# Patient Record
Sex: Male | Born: 1940 | Race: White | Hispanic: No | Smoking: Never smoker
Health system: Southern US, Community
[De-identification: ages and names within clinical notes are randomized; demographics above are authoritative.]

## PROBLEM LIST (undated history)

## (undated) DIAGNOSIS — R011 Cardiac murmur, unspecified: Secondary | ICD-10-CM

## (undated) DIAGNOSIS — M109 Gout, unspecified: Secondary | ICD-10-CM

## (undated) DIAGNOSIS — I701 Atherosclerosis of renal artery: Secondary | ICD-10-CM

## (undated) DIAGNOSIS — R413 Other amnesia: Secondary | ICD-10-CM

## (undated) DIAGNOSIS — N189 Chronic kidney disease, unspecified: Secondary | ICD-10-CM

## (undated) DIAGNOSIS — I714 Abdominal aortic aneurysm, without rupture, unspecified: Secondary | ICD-10-CM

## (undated) DIAGNOSIS — R188 Other ascites: Secondary | ICD-10-CM

## (undated) DIAGNOSIS — K7460 Unspecified cirrhosis of liver: Secondary | ICD-10-CM

## (undated) DIAGNOSIS — D696 Thrombocytopenia, unspecified: Secondary | ICD-10-CM

## (undated) DIAGNOSIS — E039 Hypothyroidism, unspecified: Secondary | ICD-10-CM

## (undated) DIAGNOSIS — I10 Essential (primary) hypertension: Secondary | ICD-10-CM

## (undated) DIAGNOSIS — I509 Heart failure, unspecified: Secondary | ICD-10-CM

## (undated) DIAGNOSIS — D649 Anemia, unspecified: Secondary | ICD-10-CM

## (undated) DIAGNOSIS — I2510 Atherosclerotic heart disease of native coronary artery without angina pectoris: Secondary | ICD-10-CM

## (undated) DIAGNOSIS — Z8639 Personal history of other endocrine, nutritional and metabolic disease: Secondary | ICD-10-CM

## (undated) DIAGNOSIS — R7303 Prediabetes: Secondary | ICD-10-CM

## (undated) DIAGNOSIS — Z9181 History of falling: Secondary | ICD-10-CM

## (undated) DIAGNOSIS — I350 Nonrheumatic aortic (valve) stenosis: Secondary | ICD-10-CM

## (undated) DIAGNOSIS — Z973 Presence of spectacles and contact lenses: Secondary | ICD-10-CM

## (undated) DIAGNOSIS — M1990 Unspecified osteoarthritis, unspecified site: Secondary | ICD-10-CM

## (undated) DIAGNOSIS — C801 Malignant (primary) neoplasm, unspecified: Secondary | ICD-10-CM

## (undated) DIAGNOSIS — I219 Acute myocardial infarction, unspecified: Secondary | ICD-10-CM

## (undated) HISTORY — PX: OTHER SURGICAL HISTORY: SHX169

## (undated) HISTORY — PX: PROSTATECTOMY: SHX69

## (undated) HISTORY — PX: CATARACT EXTRACTION W/ INTRAOCULAR LENS  IMPLANT, BILATERAL: SHX1307

## (undated) HISTORY — PX: HERNIA REPAIR: SHX51

## (undated) HISTORY — PX: EYE SURGERY: SHX253

## (undated) HISTORY — PX: CHOLECYSTECTOMY: SHX55

---

## 1997-09-08 ENCOUNTER — Ambulatory Visit (HOSPITAL_COMMUNITY): Admission: RE | Admit: 1997-09-08 | Discharge: 1997-09-08 | Payer: Self-pay | Admitting: Gastroenterology

## 1999-03-23 ENCOUNTER — Encounter: Admission: RE | Admit: 1999-03-23 | Discharge: 1999-03-23 | Payer: Self-pay | Admitting: Gastroenterology

## 1999-03-23 ENCOUNTER — Encounter: Payer: Self-pay | Admitting: Gastroenterology

## 1999-03-27 ENCOUNTER — Encounter: Payer: Self-pay | Admitting: Otolaryngology

## 1999-03-27 ENCOUNTER — Ambulatory Visit (HOSPITAL_COMMUNITY): Admission: RE | Admit: 1999-03-27 | Discharge: 1999-03-27 | Payer: Self-pay | Admitting: Otolaryngology

## 2000-08-04 ENCOUNTER — Encounter: Payer: Self-pay | Admitting: Emergency Medicine

## 2000-08-05 ENCOUNTER — Inpatient Hospital Stay (HOSPITAL_COMMUNITY): Admission: EM | Admit: 2000-08-05 | Discharge: 2000-08-06 | Payer: Self-pay | Admitting: Emergency Medicine

## 2000-08-05 ENCOUNTER — Encounter: Payer: Self-pay | Admitting: Family Medicine

## 2000-08-06 ENCOUNTER — Encounter: Payer: Self-pay | Admitting: Internal Medicine

## 2002-08-23 ENCOUNTER — Ambulatory Visit (HOSPITAL_COMMUNITY): Admission: RE | Admit: 2002-08-23 | Discharge: 2002-08-23 | Payer: Self-pay | Admitting: Gastroenterology

## 2004-05-13 ENCOUNTER — Ambulatory Visit (HOSPITAL_COMMUNITY): Admission: RE | Admit: 2004-05-13 | Discharge: 2004-05-13 | Payer: Self-pay | Admitting: Internal Medicine

## 2004-06-28 ENCOUNTER — Ambulatory Visit (HOSPITAL_COMMUNITY): Admission: RE | Admit: 2004-06-28 | Discharge: 2004-06-29 | Payer: Self-pay | Admitting: Nephrology

## 2004-06-29 ENCOUNTER — Ambulatory Visit (HOSPITAL_COMMUNITY): Admission: RE | Admit: 2004-06-29 | Discharge: 2004-06-29 | Payer: Self-pay | Admitting: Nephrology

## 2010-03-21 ENCOUNTER — Encounter: Payer: Self-pay | Admitting: Nephrology

## 2015-04-01 DIAGNOSIS — M109 Gout, unspecified: Secondary | ICD-10-CM | POA: Insufficient documentation

## 2015-04-01 DIAGNOSIS — N529 Male erectile dysfunction, unspecified: Secondary | ICD-10-CM | POA: Insufficient documentation

## 2015-04-01 DIAGNOSIS — M5136 Other intervertebral disc degeneration, lumbar region: Secondary | ICD-10-CM | POA: Insufficient documentation

## 2015-04-01 DIAGNOSIS — M1990 Unspecified osteoarthritis, unspecified site: Secondary | ICD-10-CM | POA: Insufficient documentation

## 2015-04-01 DIAGNOSIS — R7303 Prediabetes: Secondary | ICD-10-CM | POA: Insufficient documentation

## 2015-04-01 DIAGNOSIS — I701 Atherosclerosis of renal artery: Secondary | ICD-10-CM | POA: Insufficient documentation

## 2015-04-01 DIAGNOSIS — D126 Benign neoplasm of colon, unspecified: Secondary | ICD-10-CM | POA: Insufficient documentation

## 2015-05-13 DIAGNOSIS — E538 Deficiency of other specified B group vitamins: Secondary | ICD-10-CM | POA: Insufficient documentation

## 2015-05-13 DIAGNOSIS — Z8711 Personal history of peptic ulcer disease: Secondary | ICD-10-CM | POA: Insufficient documentation

## 2015-05-13 DIAGNOSIS — Z8546 Personal history of malignant neoplasm of prostate: Secondary | ICD-10-CM | POA: Insufficient documentation

## 2015-11-21 DIAGNOSIS — D696 Thrombocytopenia, unspecified: Secondary | ICD-10-CM | POA: Insufficient documentation

## 2016-03-11 DIAGNOSIS — G8929 Other chronic pain: Secondary | ICD-10-CM | POA: Insufficient documentation

## 2016-03-11 DIAGNOSIS — M25561 Pain in right knee: Secondary | ICD-10-CM | POA: Insufficient documentation

## 2016-04-05 DIAGNOSIS — H2510 Age-related nuclear cataract, unspecified eye: Secondary | ICD-10-CM | POA: Insufficient documentation

## 2016-06-13 DIAGNOSIS — M25552 Pain in left hip: Secondary | ICD-10-CM | POA: Insufficient documentation

## 2016-12-02 DIAGNOSIS — I700 Atherosclerosis of aorta: Secondary | ICD-10-CM | POA: Insufficient documentation

## 2017-02-23 DIAGNOSIS — I358 Other nonrheumatic aortic valve disorders: Secondary | ICD-10-CM | POA: Insufficient documentation

## 2017-02-23 DIAGNOSIS — I2510 Atherosclerotic heart disease of native coronary artery without angina pectoris: Secondary | ICD-10-CM | POA: Insufficient documentation

## 2017-05-10 DIAGNOSIS — Z01818 Encounter for other preprocedural examination: Secondary | ICD-10-CM | POA: Diagnosis not present

## 2017-05-16 DIAGNOSIS — R188 Other ascites: Secondary | ICD-10-CM

## 2017-05-16 DIAGNOSIS — R1084 Generalized abdominal pain: Secondary | ICD-10-CM

## 2017-05-17 DIAGNOSIS — R188 Other ascites: Secondary | ICD-10-CM | POA: Diagnosis not present

## 2017-05-17 DIAGNOSIS — R1084 Generalized abdominal pain: Secondary | ICD-10-CM | POA: Diagnosis not present

## 2017-05-18 DIAGNOSIS — R188 Other ascites: Secondary | ICD-10-CM | POA: Diagnosis not present

## 2017-05-18 DIAGNOSIS — R1084 Generalized abdominal pain: Secondary | ICD-10-CM | POA: Diagnosis not present

## 2017-05-19 ENCOUNTER — Observation Stay (HOSPITAL_COMMUNITY)
Admission: AD | Admit: 2017-05-19 | Discharge: 2017-05-21 | Disposition: A | Discharge status: Home / Self Care | Payer: Medicare Other | Source: Other Acute Inpatient Hospital | Attending: Internal Medicine | Admitting: Internal Medicine

## 2017-05-19 DIAGNOSIS — Z79890 Hormone replacement therapy (postmenopausal): Secondary | ICD-10-CM | POA: Diagnosis not present

## 2017-05-19 DIAGNOSIS — R188 Other ascites: Secondary | ICD-10-CM | POA: Diagnosis not present

## 2017-05-19 DIAGNOSIS — K7460 Unspecified cirrhosis of liver: Secondary | ICD-10-CM | POA: Diagnosis not present

## 2017-05-19 DIAGNOSIS — I10 Essential (primary) hypertension: Secondary | ICD-10-CM | POA: Diagnosis present

## 2017-05-19 DIAGNOSIS — N183 Chronic kidney disease, stage 3 unspecified: Secondary | ICD-10-CM | POA: Diagnosis present

## 2017-05-19 DIAGNOSIS — E785 Hyperlipidemia, unspecified: Secondary | ICD-10-CM | POA: Diagnosis present

## 2017-05-19 DIAGNOSIS — I959 Hypotension, unspecified: Secondary | ICD-10-CM | POA: Diagnosis not present

## 2017-05-19 DIAGNOSIS — Z9079 Acquired absence of other genital organ(s): Secondary | ICD-10-CM | POA: Insufficient documentation

## 2017-05-19 DIAGNOSIS — Z79899 Other long term (current) drug therapy: Secondary | ICD-10-CM | POA: Insufficient documentation

## 2017-05-19 DIAGNOSIS — E039 Hypothyroidism, unspecified: Secondary | ICD-10-CM | POA: Diagnosis not present

## 2017-05-19 DIAGNOSIS — Z903 Acquired absence of stomach [part of]: Secondary | ICD-10-CM | POA: Diagnosis not present

## 2017-05-19 DIAGNOSIS — K766 Portal hypertension: Secondary | ICD-10-CM | POA: Insufficient documentation

## 2017-05-19 DIAGNOSIS — D649 Anemia, unspecified: Secondary | ICD-10-CM | POA: Diagnosis not present

## 2017-05-19 DIAGNOSIS — D631 Anemia in chronic kidney disease: Secondary | ICD-10-CM | POA: Diagnosis not present

## 2017-05-19 DIAGNOSIS — Z8546 Personal history of malignant neoplasm of prostate: Secondary | ICD-10-CM | POA: Insufficient documentation

## 2017-05-19 DIAGNOSIS — R1084 Generalized abdominal pain: Secondary | ICD-10-CM | POA: Diagnosis not present

## 2017-05-19 DIAGNOSIS — Z823 Family history of stroke: Secondary | ICD-10-CM | POA: Insufficient documentation

## 2017-05-19 DIAGNOSIS — I1310 Hypertensive heart and chronic kidney disease without heart failure, with stage 1 through stage 4 chronic kidney disease, or unspecified chronic kidney disease: Secondary | ICD-10-CM | POA: Insufficient documentation

## 2017-05-19 DIAGNOSIS — M109 Gout, unspecified: Secondary | ICD-10-CM | POA: Diagnosis not present

## 2017-05-19 DIAGNOSIS — R0602 Shortness of breath: Secondary | ICD-10-CM

## 2017-05-19 HISTORY — DX: Hypothyroidism, unspecified: E03.9

## 2017-05-19 HISTORY — DX: Essential (primary) hypertension: I10

## 2017-05-19 HISTORY — DX: Chronic kidney disease, unspecified: N18.9

## 2017-05-19 NOTE — Progress Notes (Signed)
Patient arrived to the floor 6N25. Alert and oriented x4.Vital signs stable. Denies pain. Oriented to room and call bell. Text paged [facility] for MD.

## 2017-05-20 ENCOUNTER — Other Ambulatory Visit: Payer: Self-pay

## 2017-05-20 ENCOUNTER — Encounter (HOSPITAL_COMMUNITY): Payer: Self-pay | Admitting: Internal Medicine

## 2017-05-20 ENCOUNTER — Inpatient Hospital Stay (HOSPITAL_COMMUNITY): Payer: Medicare Other

## 2017-05-20 DIAGNOSIS — R188 Other ascites: Secondary | ICD-10-CM | POA: Diagnosis not present

## 2017-05-20 DIAGNOSIS — K7031 Alcoholic cirrhosis of liver with ascites: Secondary | ICD-10-CM

## 2017-05-20 DIAGNOSIS — E039 Hypothyroidism, unspecified: Secondary | ICD-10-CM | POA: Diagnosis not present

## 2017-05-20 DIAGNOSIS — E785 Hyperlipidemia, unspecified: Secondary | ICD-10-CM | POA: Diagnosis present

## 2017-05-20 DIAGNOSIS — I10 Essential (primary) hypertension: Secondary | ICD-10-CM | POA: Diagnosis not present

## 2017-05-20 DIAGNOSIS — D649 Anemia, unspecified: Secondary | ICD-10-CM | POA: Diagnosis present

## 2017-05-20 DIAGNOSIS — R0989 Other specified symptoms and signs involving the circulatory and respiratory systems: Secondary | ICD-10-CM

## 2017-05-20 DIAGNOSIS — N183 Chronic kidney disease, stage 3 unspecified: Secondary | ICD-10-CM | POA: Diagnosis present

## 2017-05-20 LAB — HEPATIC FUNCTION PANEL
ALT: 24 U/L (ref 17–63)
AST: 59 U/L — AB (ref 15–41)
Albumin: 2.3 g/dL — ABNORMAL LOW (ref 3.5–5.0)
Alkaline Phosphatase: 248 U/L — ABNORMAL HIGH (ref 38–126)
BILIRUBIN DIRECT: 0.5 mg/dL (ref 0.1–0.5)
BILIRUBIN INDIRECT: 0.7 mg/dL (ref 0.3–0.9)
Total Bilirubin: 1.2 mg/dL (ref 0.3–1.2)
Total Protein: 4.6 g/dL — ABNORMAL LOW (ref 6.5–8.1)

## 2017-05-20 LAB — BASIC METABOLIC PANEL
ANION GAP: 10 (ref 5–15)
Anion gap: 9 (ref 5–15)
BUN: 33 mg/dL — ABNORMAL HIGH (ref 6–20)
BUN: 33 mg/dL — AB (ref 6–20)
CALCIUM: 7.4 mg/dL — AB (ref 8.9–10.3)
CO2: 18 mmol/L — ABNORMAL LOW (ref 22–32)
CO2: 20 mmol/L — ABNORMAL LOW (ref 22–32)
CREATININE: 1.95 mg/dL — AB (ref 0.61–1.24)
Calcium: 7.7 mg/dL — ABNORMAL LOW (ref 8.9–10.3)
Chloride: 108 mmol/L (ref 101–111)
Chloride: 110 mmol/L (ref 101–111)
Creatinine, Ser: 1.99 mg/dL — ABNORMAL HIGH (ref 0.61–1.24)
GFR calc Af Amer: 36 mL/min — ABNORMAL LOW (ref >60)
GFR calc Af Amer: 37 mL/min — ABNORMAL LOW (ref >60)
GFR, EST NON AFRICAN AMERICAN: 31 mL/min — AB (ref >60)
GFR, EST NON AFRICAN AMERICAN: 32 mL/min — AB (ref >60)
Glucose, Bld: 126 mg/dL — ABNORMAL HIGH (ref 65–99)
Glucose, Bld: 93 mg/dL (ref 65–99)
POTASSIUM: 3.7 mmol/L (ref 3.5–5.1)
Potassium: 3.6 mmol/L (ref 3.5–5.1)
SODIUM: 139 mmol/L (ref 135–145)
Sodium: 136 mmol/L (ref 135–145)

## 2017-05-20 LAB — CBC WITH DIFFERENTIAL/PLATELET
BASOS ABS: 0 10*3/uL (ref 0.0–0.1)
BASOS PCT: 0 %
Eosinophils Absolute: 0.2 10*3/uL (ref 0.0–0.7)
Eosinophils Relative: 3 %
HEMATOCRIT: 31.8 % — AB (ref 39.0–52.0)
HEMOGLOBIN: 10.6 g/dL — AB (ref 13.0–17.0)
Lymphocytes Relative: 18 %
Lymphs Abs: 0.9 10*3/uL (ref 0.7–4.0)
MCH: 32 pg (ref 26.0–34.0)
MCHC: 33.3 g/dL (ref 30.0–36.0)
MCV: 96.1 fL (ref 78.0–100.0)
Monocytes Absolute: 0.4 10*3/uL (ref 0.1–1.0)
Monocytes Relative: 8 %
NEUTROS ABS: 3.7 10*3/uL (ref 1.7–7.7)
NEUTROS PCT: 71 %
Platelets: 90 10*3/uL — ABNORMAL LOW (ref 150–400)
RBC: 3.31 MIL/uL — ABNORMAL LOW (ref 4.22–5.81)
RDW: 15.1 % (ref 11.5–15.5)
WBC: 5.2 10*3/uL (ref 4.0–10.5)

## 2017-05-20 LAB — VITAMIN B12: VITAMIN B 12: 983 pg/mL — AB (ref 180–914)

## 2017-05-20 LAB — FOLATE: Folate: 7.4 ng/mL (ref >5.9)

## 2017-05-20 LAB — HIV ANTIBODY (ROUTINE TESTING W REFLEX): HIV SCREEN 4TH GENERATION: NONREACTIVE

## 2017-05-20 LAB — URINALYSIS, ROUTINE W REFLEX MICROSCOPIC
BILIRUBIN URINE: NEGATIVE
Glucose, UA: NEGATIVE mg/dL
Hgb urine dipstick: NEGATIVE
KETONES UR: NEGATIVE mg/dL
LEUKOCYTES UA: NEGATIVE
Nitrite: NEGATIVE
Protein, ur: NEGATIVE mg/dL
SPECIFIC GRAVITY, URINE: 1.008 (ref 1.005–1.030)
pH: 5 (ref 5.0–8.0)

## 2017-05-20 LAB — CBC
HCT: 33.5 % — ABNORMAL LOW (ref 39.0–52.0)
Hemoglobin: 11.2 g/dL — ABNORMAL LOW (ref 13.0–17.0)
MCH: 32.5 pg (ref 26.0–34.0)
MCHC: 33.4 g/dL (ref 30.0–36.0)
MCV: 97.1 fL (ref 78.0–100.0)
PLATELETS: 90 10*3/uL — AB (ref 150–400)
RBC: 3.45 MIL/uL — AB (ref 4.22–5.81)
RDW: 15.3 % (ref 11.5–15.5)
WBC: 4.6 10*3/uL (ref 4.0–10.5)

## 2017-05-20 LAB — RETICULOCYTES
RBC.: 3.54 MIL/uL — AB (ref 4.22–5.81)
Retic Count, Absolute: 70.8 10*3/uL (ref 19.0–186.0)
Retic Ct Pct: 2 % (ref 0.4–3.1)

## 2017-05-20 LAB — TYPE AND SCREEN
ABO/RH(D): AB POS
Antibody Screen: NEGATIVE

## 2017-05-20 LAB — PROTIME-INR
INR: 1.34
Prothrombin Time: 16.5 seconds — ABNORMAL HIGH (ref 11.4–15.2)

## 2017-05-20 LAB — SODIUM, URINE, RANDOM: Sodium, Ur: 91 mmol/L

## 2017-05-20 LAB — IRON AND TIBC
Iron: 59 ug/dL (ref 45–182)
SATURATION RATIOS: 38 % (ref 17.9–39.5)
TIBC: 155 ug/dL — ABNORMAL LOW (ref 250–450)
UIBC: 96 ug/dL

## 2017-05-20 LAB — ABO/RH: ABO/RH(D): AB POS

## 2017-05-20 LAB — FERRITIN: Ferritin: 193 ng/mL (ref 24–336)

## 2017-05-20 MED ORDER — ONDANSETRON HCL 4 MG PO TABS: 4.0000 mg | ORAL_TABLET | Freq: Four times a day (QID) | ORAL | Status: DC | PRN

## 2017-05-20 MED ORDER — ATORVASTATIN CALCIUM 10 MG PO TABS
10.0000 mg | ORAL_TABLET | Freq: Every day | ORAL | Status: DC
Start: 2017-05-20 — End: 2017-05-21
  Administered 2017-05-20: 10 mg via ORAL
  Filled 2017-05-20: qty 1

## 2017-05-20 MED ORDER — CLONIDINE HCL 0.1 MG PO TABS: 0.1000 mg | ORAL_TABLET | Freq: Two times a day (BID) | ORAL | Status: DC

## 2017-05-20 MED ORDER — HYDRALAZINE HCL 20 MG/ML IJ SOLN: 10.0000 mg | INTRAMUSCULAR | Status: DC | PRN

## 2017-05-20 MED ORDER — ONDANSETRON HCL 4 MG/2ML IJ SOLN: 4.0000 mg | Freq: Four times a day (QID) | INTRAMUSCULAR | Status: DC | PRN

## 2017-05-20 MED ORDER — LEVOTHYROXINE SODIUM 25 MCG PO TABS
25.0000 ug | ORAL_TABLET | Freq: Every day | ORAL | Status: DC
  Administered 2017-05-20 – 2017-05-21 (×2): 25 ug via ORAL
  Filled 2017-05-20 (×2): qty 1

## 2017-05-20 MED ORDER — CARVEDILOL 12.5 MG PO TABS
12.5000 mg | ORAL_TABLET | Freq: Two times a day (BID) | ORAL | Status: DC
  Administered 2017-05-20 – 2017-05-21 (×3): 12.5 mg via ORAL
  Filled 2017-05-20 (×3): qty 1

## 2017-05-20 MED ORDER — COLCHICINE 0.6 MG PO TABS
0.6000 mg | ORAL_TABLET | Freq: Every day | ORAL | Status: DC
  Administered 2017-05-20 – 2017-05-21 (×2): 0.6 mg via ORAL
  Filled 2017-05-20 (×2): qty 1

## 2017-05-20 MED ORDER — ACETAMINOPHEN 650 MG RE SUPP: 650.0000 mg | Freq: Four times a day (QID) | RECTAL | Status: DC | PRN

## 2017-05-20 MED ORDER — SPIRONOLACTONE 25 MG PO TABS
25.0000 mg | ORAL_TABLET | Freq: Every day | ORAL | Status: DC
  Administered 2017-05-20 – 2017-05-21 (×2): 25 mg via ORAL
  Filled 2017-05-20 (×2): qty 1

## 2017-05-20 MED ORDER — ACETAMINOPHEN 325 MG PO TABS: 650.0000 mg | ORAL_TABLET | Freq: Four times a day (QID) | ORAL | Status: DC | PRN

## 2017-05-20 MED ORDER — FUROSEMIDE 20 MG PO TABS
20.0000 mg | ORAL_TABLET | Freq: Every day | ORAL | Status: DC
  Administered 2017-05-20 – 2017-05-21 (×2): 20 mg via ORAL
  Filled 2017-05-20 (×2): qty 1

## 2017-05-20 MED ORDER — FEBUXOSTAT 40 MG PO TABS
40.0000 mg | ORAL_TABLET | Freq: Every day | ORAL | Status: DC
  Administered 2017-05-20 – 2017-05-21 (×2): 40 mg via ORAL
  Filled 2017-05-20 (×2): qty 1

## 2017-05-20 MED ORDER — AMLODIPINE BESYLATE 10 MG PO TABS: 10.0000 mg | ORAL_TABLET | Freq: Every day | ORAL | Status: DC

## 2017-05-20 NOTE — Progress Notes (Signed)
PROGRESS NOTE    Ricky Harris   SPQ:[id number]  DOB: [birth year]  DOA: 05/19/2017 PCP: [name]., MD   Brief Narrative:  Ricky Harris is a 77 y/o with fatty liver, HTN, CKD 3, HLD, PUD, s/p subtotal gastrectomy, prostate CA s/p prostatectomy, Hypothyroidism who presented to [facility] with ascites with steady accumulation over the past 3 months. He admits to a weight loss of about 10-15 lbs in past few months. No bloody stools.  He underwent a paracentesis of about 5.5 L at [place] but he states that the fluid is rapidly re accumulating.   CT Abd/Pelvis without contrast 05/16/2017  - Extensive Ascites.  Nodular Contour of liver, possible underlying Cirrhosis.  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 Post operative change in distal stomach . Around 4.8 X 4.6 CM Infra renal Abdominal aortic aneurysm. F/up CTA  Abd/pelvis in 6 months recommended  He was transferred to [facility] to be evaluated by GI.    Subjective: Abdomen is steadily distending again after paracentesis. Mild nausea, no vomiting or diarrhea. See above.    Assessment & Plan:   Principal Problem:   Ascites/ Cirrhosis - the patient dose tell me that his PCP has told him in the past that he had a fatty liver  - CT (see above) showing cirrhosis - I have discussed the need for diuretics with the patient and his son and they appear to understand- GI has recommended Lasix 20 mg daily and Spironolactone 25 mg daily - f/u on results of paracentesis - GI has further recommended outpt eval for cause of cirrhosis - AFP, Mitochondrial Ab, Anti sm muscle Ab, Hepatitis panel ordered  Active Problems:  CT also showing Infrarenal AAA - 4.8 x 4.6 -   outpt surveillance needed     Normochromic normocytic anemia - stable    Hypotension (h/o Essential hypertension) - taking Coreg, HCTZ, Ibesartan and Amlodipine at home - 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- d/c HCTZ and Amlodipine- now on Lasix and  Spironolactone - will need to monitor BP at home    CKD (chronic kidney disease) stage 3, GFR 30-59 ml/min  - follow on diuretics    HLD (hyperlipidemia) - statin    Hypothyroidism - synthroid  Gout - Colchicine  Crackles RLL - no cough, no signs of infection  DVT prophylaxis: SCDs Code Status: Full code Family Communication: son and granddaughter  Disposition Plan: home tomorrow Consultants:   GI Procedures:    Antimicrobials:  Anti-infectives (From admission, onward)   None       Objective: Vitals:   05/20/17 0500 05/20/17 0520 05/20/17 0743 05/20/17 1412  BP:  102/63 104/66 109/67  Pulse:  60 70 65  Resp:  18 18   Temp:  98.7 F (37.1 C) 98.7 F (37.1 C) 98.7 F (37.1 C)  TempSrc:  Oral Oral Oral  SpO2:  99% 95% 98%  Weight: 83 kg (182 lb 15.7 oz)     Height:        Intake/Output Summary (Last 24 hours) at 05/20/2017 1601 Last data filed at 05/20/2017 0521 Gross per 24 hour  Intake 60 ml  Output 500 ml  Net -440 ml   Filed Weights   05/19/17 2343 05/19/17 2350 05/20/17 0500  Weight: 83 kg (182 lb 15.7 oz) 83 kg (182 lb 15.7 oz) 83 kg (182 lb 15.7 oz)    Examination: General exam: Appears comfortable  HEENT: PERRLA, oral mucosa moist, no sclera icterus or thrush Respiratory system: crackles  in LLL Respiratory effort normal. Cardiovascular system: S1 & S2 heard, RRR.  No murmurs  Gastrointestinal system: Abdomen soft, non-tender,  Distended with fluid wave. Normal bowel sound.   Central nervous system: Alert and oriented. No focal neurological deficits. Extremities: No cyanosis, clubbing or edema Skin: No rashes or ulcers Psychiatry:  Mood & affect appropriate.     Data Reviewed: I have personally reviewed following labs and imaging studies  CBC: Recent Labs  Lab 05/20/17 0051 05/20/17 0557  WBC 5.2 4.6  NEUTROABS 3.7  --   HGB 10.6* 11.2*  HCT 31.8* 33.5*  MCV 96.1 97.1  PLT 90* 90*   Basic Metabolic Panel: Recent Labs  Lab  05/20/17 0051 05/20/17 0557  NA 136 139  K 3.6 3.7  CL 108 110  CO2 18* 20*  GLUCOSE 126* 93  BUN 33* 33*  CREATININE 1.99* 1.95*  CALCIUM 7.4* 7.7*   GFR: Estimated Creatinine Clearance: 35.9 mL/min (A) (by C-G formula based on SCr of 1.95 mg/dL (H)). Liver Function Tests: Recent Labs  Lab 05/20/17 0051  AST 59*  ALT 24  ALKPHOS 248*  BILITOT 1.2  PROT 4.6*  ALBUMIN 2.3*   No results for input(s): LIPASE, AMYLASE in the last 168 hours. No results for input(s): AMMONIA in the last 168 hours. Coagulation Profile: Recent Labs  Lab 05/20/17 0051  INR 1.34   Cardiac Enzymes: No results for input(s): CKTOTAL, CKMB, CKMBINDEX, TROPONINI in the last 168 hours. BNP (last 3 results) No results for input(s): PROBNP in the last 8760 hours. HbA1C: No results for input(s): HGBA1C in the last 72 hours. CBG: No results for input(s): GLUCAP in the last 168 hours. Lipid Profile: No results for input(s): CHOL, HDL, LDLCALC, TRIG, CHOLHDL, LDLDIRECT in the last 72 hours. Thyroid Function Tests: No results for input(s): TSH, T4TOTAL, FREET4, T3FREE, THYROIDAB in the last 72 hours. Anemia Panel: Recent Labs    05/20/17 0557  VITAMINB12 983*  FOLATE 7.4  FERRITIN 193  TIBC 155*  IRON 59  RETICCTPCT 2.0   Urine analysis:    Component Value Date/Time   COLORURINE YELLOW 05/20/2017 0206   APPEARANCEUR CLEAR 05/20/2017 0206   LABSPEC 1.008 05/20/2017 0206   PHURINE 5.0 05/20/2017 0206   GLUCOSEU NEGATIVE 05/20/2017 0206   HGBUR NEGATIVE 05/20/2017 0206   BILIRUBINUR NEGATIVE 05/20/2017 0206   KETONESUR NEGATIVE 05/20/2017 0206   PROTEINUR NEGATIVE 05/20/2017 0206   NITRITE NEGATIVE 05/20/2017 0206   LEUKOCYTESUR NEGATIVE 05/20/2017 0206   Sepsis Labs: @LABRCNTIP (procalcitonin:4,lacticidven:4) )No results found for this or any previous visit (from the past 240 hour(s)).       Radiology Studies: Dg Chest Port 1 View  Result Date: 05/20/2017 CLINICAL DATA:   Shortness of breath. EXAM: PORTABLE CHEST 1 VIEW COMPARISON:  Lung bases from abdominal CT 05/16/2017 FINDINGS: Left lung base opacity represents pleural effusion and atelectasis is seen on recent CT. Right lung is clear. No pulmonary edema. Normal heart size. Aortic atherosclerosis. No pneumothorax. IMPRESSION: Left lung base opacity represents atelectasis and pleural effusion as seen on recent abdominal CT. Aortic Atherosclerosis (ICD10-I70.0). Electronically Signed   By: Jeb Levering M.D.   On: 05/20/2017 02:53      Scheduled Meds: . atorvastatin  10 mg Oral q1800  . carvedilol  12.5 mg Oral BID WC  . colchicine  0.6 mg Oral Daily  . febuxostat  40 mg Oral Daily  . furosemide  20 mg Oral Daily  . levothyroxine  25 mcg Oral QAC breakfast  .  spironolactone  25 mg Oral Daily   Continuous Infusions:   LOS: 1 day    Time spent in minutes: [place], MD [facility] Pager: [email or web address] Password [facility] 05/20/2017, 4:01 PM

## 2017-05-20 NOTE — H&P (Signed)
History and Physical    Ricky Harris CWC:[id number] DOB: [birth year] DOA: 05/19/2017  PCP: [name]., MD  Patient coming from: Patient was transferred from [facility].  Chief Complaint: Ascites.  HPI: Ricky Harris is a 77 y.o. male with history of hypertension, chronic kidney disease stage III, anemia who had recent left herniorrhaphy last week presented to the ER at [facility] on May 16, 2017 with complaints of increasing abdominal girth with nausea.  Patient has been having these progressive symptoms over the last 3 months.  Denies any pain fever or chills.  Patient denies drinking any alcohol at any time.  Has never had any GI bleed.  Patient presented to the ER and had CT abdomen pelvis done which showed large amount of ascites and infrarenal abdominal aortic aneurysm which is slightly enlarged from previous.  Patient had paracentesis done and 5 L was aspirated.  Labs of which are still pending.  Since patient requested transfer for gastroenterologist input patient was transferred to [facility].  At the time of my exam patient is not in distress but states that his abdomen is getting distended again.  On exam patient abdomen is distended but nontender.  Patient is not short of breath.  Patient's labs on admission 2 days ago was showing hemoglobin of 10.9 and creatinine of 1.9.  Reviewed other labs.  ED Course: Patient was a direct admit.  Review of Systems: As per HPI, rest all negative.   Past Medical History:  Diagnosis Date  . Chronic kidney disease   . Hypertension   . Hypothyroidism     Past Surgical History:  Procedure Laterality Date  . HERNIA REPAIR    . subtotal gastrectomy       reports that he has never smoked. He has never used smokeless tobacco. His alcohol and drug histories are not on file.  Not on File  Family History  Problem Relation Age of Onset  . Diabetes Mellitus II Mother   . Stroke Mother     Prior to Admission  medications   Not on File    Physical Exam: Vitals:   05/19/17 2343 05/19/17 2350  BP: 111/69   Pulse: 63   Temp: 98 F (36.7 C)   TempSrc: Oral   SpO2: 100%   Weight: 83 kg (182 lb 15.7 oz) 83 kg (182 lb 15.7 oz)  Height: 6' (1.829 m) 6' (1.829 m)      Constitutional: Moderately built and nourished. Vitals:   05/19/17 2343 05/19/17 2350  BP: 111/69   Pulse: 63   Temp: 98 F (36.7 C)   TempSrc: Oral   SpO2: 100%   Weight: 83 kg (182 lb 15.7 oz) 83 kg (182 lb 15.7 oz)  Height: 6' (1.829 m) 6' (1.829 m)   Eyes: Anicteric no pallor. ENMT: No discharge from the ears eyes nose or mouth. Neck: No mass felt.  No neck rigidity. Respiratory: No rhonchi or crepitations. Cardiovascular: S1-S2 heard no murmurs appreciated. Abdomen: Distended nontender bowel sounds present. Musculoskeletal: No edema.  No effusion. Skin: No rash.  Skin appears warm. Neurologic: Alert awake oriented to time place and person.  Moves all extremities. Psychiatric: Appears normal.  Normal affect.   Labs on Admission: I have personally reviewed following labs and imaging studies  CBC: Recent Labs  Lab 05/20/17 0051  WBC 5.2  NEUTROABS 3.7  HGB 10.6*  HCT 31.8*  MCV 96.1  PLT 90*   Basic Metabolic Panel: Recent Labs  Lab 05/20/17 0051  NA 136  K 3.6  CL 108  CO2 18*  GLUCOSE 126*  BUN 33*  CREATININE 1.99*  CALCIUM 7.4*   GFR: Estimated Creatinine Clearance: 35.2 mL/min (A) (by C-G formula based on SCr of 1.99 mg/dL (H)). Liver Function Tests: Recent Labs  Lab 05/20/17 0051  AST 59*  ALT 24  ALKPHOS 248*  BILITOT 1.2  PROT 4.6*  ALBUMIN 2.3*   No results for input(s): LIPASE, AMYLASE in the last 168 hours. No results for input(s): AMMONIA in the last 168 hours. Coagulation Profile: Recent Labs  Lab 05/20/17 0051  INR 1.34   Cardiac Enzymes: No results for input(s): CKTOTAL, CKMB, CKMBINDEX, TROPONINI in the last 168 hours. BNP (last 3 results) No results for  input(s): PROBNP in the last 8760 hours. HbA1C: No results for input(s): HGBA1C in the last 72 hours. CBG: No results for input(s): GLUCAP in the last 168 hours. Lipid Profile: No results for input(s): CHOL, HDL, LDLCALC, TRIG, CHOLHDL, LDLDIRECT in the last 72 hours. Thyroid Function Tests: No results for input(s): TSH, T4TOTAL, FREET4, T3FREE, THYROIDAB in the last 72 hours. Anemia Panel: No results for input(s): VITAMINB12, FOLATE, FERRITIN, TIBC, IRON, RETICCTPCT in the last 72 hours. Urine analysis:    Component Value Date/Time   COLORURINE YELLOW 05/20/2017 0206   APPEARANCEUR CLEAR 05/20/2017 0206   LABSPEC 1.008 05/20/2017 0206   PHURINE 5.0 05/20/2017 0206   GLUCOSEU NEGATIVE 05/20/2017 0206   HGBUR NEGATIVE 05/20/2017 0206   BILIRUBINUR NEGATIVE 05/20/2017 0206   KETONESUR NEGATIVE 05/20/2017 0206   PROTEINUR NEGATIVE 05/20/2017 0206   NITRITE NEGATIVE 05/20/2017 0206   LEUKOCYTESUR NEGATIVE 05/20/2017 0206   Sepsis Labs: @LABRCNTIP (procalcitonin:4,lacticidven:4) )No results found for this or any previous visit (from the past 240 hour(s)).   Radiological Exams on Admission: Dg Chest Port 1 View  Result Date: 05/20/2017 CLINICAL DATA:  Shortness of breath. EXAM: PORTABLE CHEST 1 VIEW COMPARISON:  Lung bases from abdominal CT 05/16/2017 FINDINGS: Left lung base opacity represents pleural effusion and atelectasis is seen on recent CT. Right lung is clear. No pulmonary edema. Normal heart size. Aortic atherosclerosis. No pneumothorax. IMPRESSION: Left lung base opacity represents atelectasis and pleural effusion as seen on recent abdominal CT. Aortic Atherosclerosis (ICD10-I70.0). Electronically Signed   By: Jeb Levering M.D.   On: 05/20/2017 02:53     Assessment/Plan Principal Problem:   Ascites Active Problems:   Normochromic normocytic anemia   Essential hypertension   CKD (chronic kidney disease) stage 3, GFR 30-59 ml/min (HCC)   HLD (hyperlipidemia)    Hypothyroidism    1. 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  CT scan done at [facility] shows features concerning for cirrhosis.  Paracentesis labs needs to be obtained from [facility].  Patient's abdomen is again distended and may need again paracentesis.  Consult gastroenterologist in a.m. for further input. 2. Hypertension -we will continue amlodipine Coreg clonidine.  We will hold off ARB and hydrochlorothiazide since patient may need further paracentesis and given the patient also has chronic kidney disease we will try to avoid further worsening of renal function.  Patient is placed on PRN IV hydralazine. 3. Infrarenal abdominal aortic artery some slightly enlarged from previous as per the CAT scan -denies any abdominal pain.  Will need close follow-up. 4. Chronic kidney disease stage III -closely follow metabolic panel. 5. Hypothyroidism on Synthroid. 6. Hyperlipidemia on statins. 7. History of gout on Uloric.  8. Anemia likely from chronic kidney disease -follow CBC.  Check anemia panel.   DVT prophylaxis: SCDs in anticipation of procedure. Code Status: Full code. Family Communication: Discussed with patient. Disposition Plan: Home. Consults called: None. Admission status: Observation.   Rise Patience MD [facility] Pager [phone].  If 7PM-7AM, please contact night-coverage [email or web address] Password [facility]  05/20/2017, 3:38 AM

## 2017-05-20 NOTE — Progress Notes (Signed)
Fluid filled blisters noted to RUQ, small foam dressings applied.

## 2017-05-20 NOTE — Consult Note (Signed)
Referring Provider: [facility] Primary Care Physician:  [name]., MD Primary Gastroenterologist:  unassigned  Reason for Consultation:  scites  HPI: Ricky Harris is a 77 y.o. male with past medical history of stage III chronic kidney disease and hypertension presented to the [facility] with increasing abdominal girth and nausea. CT abdomen pelvis showed large amount of ascites. Underwent 5 L paracentesis. Because of lack of GI coverage patient was transferred to [facility] for further evaluation.  Patient seen and examined at bedside. Family at bedside. Patient complaining of increased abdominal girth for last 2-3 months. Initially he thought that he is gaining weight but he was notified during hernia repair surgery that he has a fluid in his abdomen. Patient denies any active GI symptoms except for nausea.Denied any abdominal pain. Denied diarrhea or constipation. Denied blood in the stool or black stool. Patient denies alcohol use. He has been told that he has a fatty liver for many years.he has lost around 20 pounds of weight in last 3 months despite of having ascites.  Last colonoscopy 2 years ago in [place] History of subtotal gastrectomy for bleeding ulcer 30 years ago.  [facility] Records ------------------------------------- - CT Abd/Pelvis without contrast 05/16/2017  - Extensive Ascites.  Nodular Contour of liver, possible underlying Cirrhosis.  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 Post operative change in distal stomach . Around 4.8 X 4.6 CM Infra renal Abdominal aortic aneurysm. F/up CTA  Abd/pelvis in 6 months recommended  - Lipase 901 -  03.19/2019  - US paracentesis 05/17/2017 - 5.5 L of fluids removed. Fluid analysis not available  to review  Past Medical History:  Diagnosis Date  . Chronic kidney disease   . Hypertension   . Hypothyroidism     Past Surgical History:  Procedure Laterality Date  . HERNIA REPAIR    . subtotal gastrectomy      Prior  to Admission medications   Medication Sig Start Date End Date Taking? Authorizing Provider  amLODipine (NORVASC) 10 MG tablet Take 10 mg by mouth daily. 05/06/17  Yes [provider]  atorvastatin (LIPITOR) 10 MG tablet Take 10 mg by mouth daily at 6 PM. 05/06/17  Yes [provider]  carvedilol (COREG) 12.5 MG tablet Take 12.5 mg by mouth 2 (two) times daily. 03/22/17  Yes [provider]  colchicine 0.6 MG tablet Take 0.6 mg by mouth daily. 03/22/17  Yes [provider]  febuxostat (ULORIC) 40 MG tablet Take 40 mg by mouth daily.   Yes [provider]  hydrochlorothiazide (HYDRODIURIL) 25 MG tablet Take 25 mg by mouth daily. 05/06/17  Yes [provider]  irbesartan (AVAPRO) 150 MG tablet Take 150 mg by mouth daily. 05/06/17  Yes [provider]  levothyroxine (SYNTHROID, LEVOTHROID) 25 MCG tablet Take 25 mcg by mouth every morning. 04/05/17  Yes [provider]  ondansetron (ZOFRAN) 4 MG tablet Take 4 mg by mouth every 8 (eight) hours as needed for nausea/vomiting. 05/10/17  Yes [provider]    Scheduled Meds: . atorvastatin  10 mg Oral q1800  . carvedilol  12.5 mg Oral BID WC  . colchicine  0.6 mg Oral Daily  . febuxostat  40 mg Oral Daily  . levothyroxine  25 mcg Oral QAC breakfast   Continuous Infusions: PRN Meds:.acetaminophen **OR** acetaminophen, ondansetron **OR** ondansetron (ZOFRAN) IV  Allergies as of 05/18/2017  . (Not on File)    Family History  Problem Relation Age of Onset  . Diabetes  Mellitus II Mother   . Stroke Mother     Social History   Socioeconomic History  . Marital status: Legally Separated    Spouse name: Not on file  . Number of children: Not on file  . Years of education: Not on file  . Highest education level: Not on file  Occupational History  . Not on file  Social Needs  . Financial resource strain: Not on file  . Food insecurity:    Worry: Not on file    Inability:  Not on file  . Transportation needs:    Medical: Not on file    Non-medical: Not on file  Tobacco Use  . Smoking status: Never Smoker  . Smokeless tobacco: Never Used  Substance and Sexual Activity  . Alcohol use: Not on file  . Drug use: Not on file  . Sexual activity: Not on file  Lifestyle  . Physical activity:    Days per week: Not on file    Minutes per session: Not on file  . Stress: Not on file  Relationships  . Social connections:    Talks on phone: Not on file    Gets together: Not on file    Attends religious service: Not on file    Active member of club or organization: Not on file    Attends meetings of clubs or organizations: Not on file    Relationship status: Not on file  . Intimate partner violence:    Fear of current or ex partner: Not on file    Emotionally abused: Not on file    Physically abused: Not on file    Forced sexual activity: Not on file  Other Topics Concern  . Not on file  Social History Narrative  . Not on file    Review of Systems: Review of Systems  Constitutional: Positive for weight loss. Negative for chills and fever.  HENT: Negative for hearing loss and tinnitus.   Eyes: Negative for blurred vision and double vision.  Respiratory: Positive for shortness of breath. Negative for cough and hemoptysis.   Cardiovascular: Negative for chest pain and palpitations.  Gastrointestinal: Positive for nausea. Negative for abdominal pain, blood in stool, constipation, diarrhea, heartburn, melena and vomiting.  Genitourinary: Negative for dysuria and urgency.  Musculoskeletal: Positive for back pain. Negative for myalgias.  Skin: Negative for itching and rash.  Neurological: Negative for seizures and loss of consciousness.  Endo/Heme/Allergies: Does not bruise/bleed easily.  Psychiatric/Behavioral: Negative for hallucinations and suicidal ideas.    Physical Exam: Vital signs: Vitals:   05/20/17 0520 05/20/17 0743  BP: 102/63 104/66  Pulse:  60 70  Resp: 18 18  Temp: 98.7 F (37.1 C) 98.7 F (37.1 C)  SpO2: 99% 95%   Last BM Date: 05/19/17 Physical Exam  Constitutional: He is oriented to person, place, and time. He appears well-developed and well-nourished. No distress.  HENT:  Head: Normocephalic and atraumatic.  Mouth/Throat: No oropharyngeal exudate.  Eyes: EOM are normal. No scleral icterus.  Neck: Normal range of motion. Neck supple.  Cardiovascular: Normal rate, regular rhythm and normal heart sounds.  Pulmonary/Chest: Effort normal and breath sounds normal. No respiratory distress.  Abdominal: Soft. Bowel sounds are normal. He exhibits distension. There is no tenderness. There is no rebound and no guarding.  Musculoskeletal: Normal range of motion. He exhibits no edema.  Neurological: He is alert and oriented to person, place, and time.  Skin: Skin is warm. No erythema.  Psychiatric: He has a  normal mood and affect. Judgment and thought content normal.  Vitals reviewed.  GI:  Lab Results: Recent Labs    05/20/17 0051 05/20/17 0557  WBC 5.2 4.6  HGB 10.6* 11.2*  HCT 31.8* 33.5*  PLT 90* 90*   BMET Recent Labs    05/20/17 0051 05/20/17 0557  NA 136 139  K 3.6 3.7  CL 108 110  CO2 18* 20*  GLUCOSE 126* 93  BUN 33* 33*  CREATININE 1.99* 1.95*  CALCIUM 7.4* 7.7*   LFT Recent Labs    05/20/17 0051  PROT 4.6*  ALBUMIN 2.3*  AST 59*  ALT 24  ALKPHOS 248*  BILITOT 1.2  BILIDIR 0.5  IBILI 0.7   PT/INR Recent Labs    05/20/17 0051  LABPROT 16.5*  INR 1.34     Studies/Results: Dg Chest Port 1 View  Result Date: 05/20/2017 CLINICAL DATA:  Shortness of breath. EXAM: PORTABLE CHEST 1 VIEW COMPARISON:  Lung bases from abdominal CT 05/16/2017 FINDINGS: Left lung base opacity represents pleural effusion and atelectasis is seen on recent CT. Right lung is clear. No pulmonary edema. Normal heart size. Aortic atherosclerosis. No pneumothorax. IMPRESSION: Left lung base opacity represents  atelectasis and pleural effusion as seen on recent abdominal CT. Aortic Atherosclerosis (ICD10-I70.0). Electronically Signed   By: Jeb Levering M.D.   On: 05/20/2017 02:53    Impression/Plan: - new-onset ascites. Most likely from underlying cirrhosis. Status post paracentesis with removal of 5.5 L of fluid at [facility]. Fluid analysis not available to review. - CT scan showing changes of cirrhosis. MELD score 17 . Patient with low platelet count of 99. History of fatty liver disease in the past.No alcohol use. Hepatitis panel pending. - chronic kidney disease with creatinine of 1.95. - History of subtotal gastrectomy for bleeding ulcer 30 years ago. - weight loss. Concerning for underlying malignancy.Elevated alkaline phosphatase. Underlying hepatocellular carcinoma cannot be ruled out based on noncontrasted CT scan.had colonoscopy 2 years ago.  Recommendations ------------------------ - requests fluid analysis results from [place] [facility]. Follow hepatitis panel, check AMA, ASMA. Normal situation.ferritin 193. - Start low dose diuretics with Lasix 20 mg and spiral 25 mg. Possibility of worsening of underlying chronic kidney disease discussed with the patient and family. They verbalized understanding. - hopefully discharge home tomorrow. Patient has a follow-up scheduled with nephrologist in next few weeks. - Recommend outpatient workup for cirrhosis, ascites and weight loss. - 2 g sodium diet. - Records from [facility] reviewed and summarized. - GI will follow    LOS: 1 day   Otis Brace  MD, FACP 05/20/2017, 11:58 AM  Contact #  [phone]

## 2017-05-21 ENCOUNTER — Other Ambulatory Visit: Payer: Self-pay

## 2017-05-21 DIAGNOSIS — N183 Chronic kidney disease, stage 3 (moderate): Secondary | ICD-10-CM | POA: Diagnosis not present

## 2017-05-21 DIAGNOSIS — E785 Hyperlipidemia, unspecified: Secondary | ICD-10-CM | POA: Diagnosis not present

## 2017-05-21 DIAGNOSIS — R188 Other ascites: Secondary | ICD-10-CM | POA: Diagnosis not present

## 2017-05-21 DIAGNOSIS — I10 Essential (primary) hypertension: Secondary | ICD-10-CM | POA: Diagnosis not present

## 2017-05-21 LAB — COMPREHENSIVE METABOLIC PANEL
ALT: 25 U/L (ref 17–63)
AST: 38 U/L (ref 15–41)
Albumin: 2.3 g/dL — ABNORMAL LOW (ref 3.5–5.0)
Alkaline Phosphatase: 240 U/L — ABNORMAL HIGH (ref 38–126)
Anion gap: 6 (ref 5–15)
BUN: 31 mg/dL — ABNORMAL HIGH (ref 6–20)
CHLORIDE: 110 mmol/L (ref 101–111)
CO2: 22 mmol/L (ref 22–32)
Calcium: 7.7 mg/dL — ABNORMAL LOW (ref 8.9–10.3)
Creatinine, Ser: 2.06 mg/dL — ABNORMAL HIGH (ref 0.61–1.24)
GFR, EST AFRICAN AMERICAN: 35 mL/min — AB (ref >60)
GFR, EST NON AFRICAN AMERICAN: 30 mL/min — AB (ref >60)
Glucose, Bld: 103 mg/dL — ABNORMAL HIGH (ref 65–99)
POTASSIUM: 3.9 mmol/L (ref 3.5–5.1)
Sodium: 138 mmol/L (ref 135–145)
TOTAL PROTEIN: 4.7 g/dL — AB (ref 6.5–8.1)
Total Bilirubin: 1.7 mg/dL — ABNORMAL HIGH (ref 0.3–1.2)

## 2017-05-21 LAB — AFP TUMOR MARKER: AFP, SERUM, TUMOR MARKER: 2.9 ng/mL (ref 0.0–8.3)

## 2017-05-21 LAB — HEPATITIS PANEL, ACUTE
HEP A IGM: NEGATIVE
HEP B S AG: NEGATIVE
Hep B C IgM: NEGATIVE

## 2017-05-21 MED ORDER — SPIRONOLACTONE 25 MG PO TABS: 25.0000 mg | ORAL_TABLET | Freq: Every day | ORAL | 0 refills | Status: DC

## 2017-05-21 MED ORDER — FUROSEMIDE 20 MG PO TABS: 20.0000 mg | ORAL_TABLET | Freq: Every day | ORAL | 0 refills | Status: DC

## 2017-05-21 NOTE — Care Management CC44 (Signed)
Condition Code 44 Documentation Completed  Patient Details  Name: Ricky Harris MRN: [id number] Date of Birth: [birth year]   Condition Code 44 given:  Yes Patient signature on Condition Code 44 notice:  Yes Documentation of 2 MD's agreement:  Yes Code 44 added to claim:  Yes    Claudie Leach, RN 05/21/2017, 11:18 AM

## 2017-05-21 NOTE — Discharge Instructions (Signed)
Please check your BP at home daily at rest and keep log for PCP. Avoid drinking too much water and taking in more than 2 gm of sodium a day to prevent your abdomen from swelling. Try to stay consistent.   Please take all your medications with you for your next visit with your Primary MD. Please request your Primary MD to go over all hospital test results at the follow up. Please ask your Primary MD to get all Hospital records sent to his/her office.  If you experience worsening of your admission symptoms, develop shortness of breath, chest pain, suicidal or homicidal thoughts or a life threatening emergency, you must seek medical attention immediately by calling 911 or calling your MD.  Ricky Harris must read the complete instructions/literature along with all the possible adverse reactions/side effects for all the medicines you take including new medications that have been prescribed to you. Take new medicines after you have completely understood and accpet all the possible adverse reactions/side effects.   Do not drive when taking pain medications or sedatives.    Do not take more than prescribed Pain, Sleep and Anxiety Medications  If you have smoked or chewed Tobacco in the last 2 yrs please stop. Stop any regular alcohol and or recreational drug use.  Wear Seat belts while driving.   Cirrhosis Cirrhosis is long-term (chronic) liver injury. The liver is your largest internal organ, and it performs many functions. The liver converts food into energy, removes toxic material from your blood, makes important proteins, and absorbs necessary vitamins from your diet. If you have cirrhosis, it means many of your healthy liver cells have been replaced by scar tissue. This prevents blood from flowing through your liver, which makes it difficult for your liver to function. This scarring is not reversible, but treatment can prevent it from getting worse. What are the causes? Hepatitis C and long-term  alcohol abuse are the most common causes of cirrhosis. Other causes include:  Nonalcoholic fatty liver disease.  Hepatitis B infection.  Autoimmune hepatitis.  Diseases that cause blockage of ducts inside the liver.  Inherited liver diseases.  Reactions to certain long-term medicines.  Parasitic infections.  Long-term exposure to certain toxins.  What increases the risk? You may have a higher risk of cirrhosis if you:  Have certain hepatitis viruses.  Abuse alcohol, especially if you are male.  Are overweight.  Share needles.  Have unprotected sex with someone who has hepatitis.  What are the signs or symptoms? You may not have any signs and symptoms at first. Symptoms may not develop until the damage to your liver starts to get worse. Signs and symptoms of cirrhosis may include:  Tenderness in the right-upper part of your abdomen.  Weakness and tiredness (fatigue).  Loss of appetite.  Nausea.  Weight loss and muscle loss.  Itchiness.  Yellow skin and eyes (jaundice).  Buildup of fluid in the abdomen (ascites).  Swelling of the feet and ankles (edema).  Appearance of tiny blood vessels under the skin.  Mental confusion.  Easy bruising and bleeding.  How is this diagnosed? Your health care provider may suspect cirrhosis based on your symptoms and medical history, especially if you have other medical conditions or a history of alcohol abuse. Your health care provider will do a physical exam to feel your liver and check for signs of cirrhosis. Your health care provider may perform other tests, including:  Blood tests to check: ? Whether you have hepatitis B or C. ?  Kidney function. ? Liver function.  Imaging tests such as: ? MRI or CT scan to look for changes seen in advanced cirrhosis. ? Ultrasound to see if normal liver tissue is being replaced by scar tissue.  A procedure using a long needle to take a sample of liver tissue (biopsy) for  examination under a microscope. Liver biopsy can confirm the diagnosis of cirrhosis.  How is this treated? Treatment depends on how damaged your liver is and what caused the damage. Treatment may include treating cirrhosis symptoms or treating the underlying causes of the condition to try to slow the progression of the damage. Treatment may include:  Making lifestyle changes, such as: ? Eating a healthy diet. ? Restricting salt intake. ? Maintaining a healthy weight. ? Not abusing drugs or alcohol.  Taking medicines to: ? Treat liver infections or other infections. ? Control itching. ? Reduce fluid buildup. ? Reduce certain blood toxins. ? Reduce risk of bleeding from enlarged blood vessels in the stomach or esophagus (varices).  If varices are causing bleeding problems, you may need treatment with a procedure that ties up the vessels causing them to fall off (band ligation).  If cirrhosis is causing your liver to fail, your health care provider may recommend a liver transplant.  Other treatments may be recommended depending on any complications of cirrhosis, such as liver-related kidney failure (hepatorenal syndrome).  Follow these instructions at home:  Take medicines only as directed by your health care provider. Do not use drugs that are toxic to your liver. Ask your health care provider before taking any new medicines, including over-the-counter medicines.  Rest as needed.  Eat a well-balanced diet. Ask your health care provider or dietitian for more information.  You may have to follow a low-salt diet or restrict your water intake as directed.  Do not drink alcohol. This is especially important if you are taking acetaminophen.  Keep all follow-up visits as directed by your health care provider. This is important. Contact a health care provider if:  You have fatigue or weakness that is getting worse.  You develop swelling of the hands, feet, legs, or face.  You have a  fever.  You develop loss of appetite.  You have nausea or vomiting.  You develop jaundice.  You develop easy bruising or bleeding. Get help right away if:  You vomit bright red blood or a material that looks like coffee grounds.  You have blood in your stools.  Your stools appear black and tarry.  You become confused.  You have chest pain or trouble breathing. This information is not intended to replace advice given to you by your health care provider. Make sure you discuss any questions you have with your health care provider. Document Released: 02/14/2005 Document Revised: 06/25/2015 Document Reviewed: 10/23/2013 Elsevier Interactive Patient Education  2018 [employer].  Ascites Ascites is a collection of excess fluid in the abdomen. Ascites can range from mild to severe. It can get worse without treatment. What are the causes? Possible causes include:  Cirrhosis. This is the most common cause of ascites.  Infection or inflammation in the abdomen.  Cancer in the abdomen.  Heart failure.  Kidney disease.  Inflammation of the pancreas.  Clots in the veins of the liver.  What are the signs or symptoms? Signs and symptoms may include:  A feeling of fullness in your abdomen. This is common.  An increase in the size of your abdomen or your waist.  Swelling in your  legs.  Swelling of the scrotum in men.  Difficulty breathing.  Abdominal pain.  Sudden weight gain.  If the condition is mild, you may not have symptoms. How is this diagnosed? To make a diagnosis, your health care provider will:  Ask about your medical history.  Perform a physical exam.  Order imaging tests, such as an ultrasound or CT scan of your abdomen.  How is this treated? Treatment depends on the cause of the ascites. It may include:  Taking a pill to make you urinate. This is called a water pill (diuretic pill).  Strictly reducing your salt (sodium) intake. Salt can cause  extra fluid to be kept in the body, and this makes ascites worse.  Having a procedure to remove fluid from your abdomen (paracentesis).  Having a procedure to transfer fluid from your abdomen into a vein.  Having a procedure that connects two of the major veins within your liver and relieves pressure on your liver (TIPS procedure).  Ascites may go away or improve with treatment of the condition that caused it. Follow these instructions at home:  Keep track of your weight. To do this, weigh yourself at the same time every day and record your weight.  Keep track of how much you drink and any changes in the amount you urinate.  Follow any instructions that your health care provider gives you about how much to drink.  Try not to eat salty (high-sodium) foods.  Take medicines only as directed by your health care provider.  Keep all follow-up visits as directed by your health care provider. This is important.  Report any changes in your health to your health care provider, especially if you develop new symptoms or your symptoms get worse. Contact a health care provider if:  Your gain more than 3 pounds in 3 days.  Your abdominal size or your waist size increases.  You have new swelling in your legs.  The swelling in your legs gets worse. Get help right away if:  You develop a fever.  You develop confusion.  You develop new or worsening difficulty breathing.  You develop new or worsening abdominal pain.  You develop new or worsening swelling in the scrotum (in men). This information is not intended to replace advice given to you by your health care provider. Make sure you discuss any questions you have with your health care provider. Document Released: 02/14/2005 Document Revised: 06/24/2015 Document Reviewed: 09/13/2013 Elsevier Interactive Patient Education  [employer].

## 2017-05-21 NOTE — Discharge Summary (Signed)
Physician Discharge Summary  SUMMIT ARROYAVE QPR:[id number] DOB: 12/01/1940 DOA: 05/19/2017  PCP: [name]., MD  Admit date: 05/19/2017 Discharge date: 05/21/2017  Admitted From: home  Disposition:  home   Recommendations for Outpatient Follow-up:  1. F/u diuretic dosing, electrolytes and renal function 2. Continue education about cirrhosis   Discharge Condition:  stable   CODE STATUS:  Full code   Diet recommendation:  Low sodium Consultations:  GI    Discharge Diagnoses:  Principal Problem:   Ascites Active Problems:   Normochromic normocytic anemia   Essential hypertension   CKD (chronic kidney disease) stage 3, GFR 30-59 ml/min (HCC)   HLD (hyperlipidemia)   Hypothyroidism    Subjective: He has no new complaints today. Abdomen size is stable.  Brief Summary: Ricky Harris is a 77 y/o with fatty liver, HTN, CKD 3, HLD, PUD, s/p subtotal gastrectomy, prostate CA s/p prostatectomy, Hypothyroidism, recent right inguinal hernia repari who presented to [facility] with ascites with steady accumulation over the past 3 months. He admits to a weight loss of about 10-15 lbs in past few months. No bloody stools.  He underwent a paracentesis of about 5.5 L at [place] but he states that the fluid is  reaccumulating.  CT Abd/Pelvis without contrast 05/16/2017 - Extensive Ascites. Nodular Contour of liver, possible underlying Cirrhosis. 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 Post operative change in distal stomach . Around 4.8 X 4.6 CM Infra renal Abdominal aortic aneurysm. F/up CTA Abd/pelvis in 6 months recommended  He was transferred to [facility] to be evaluated by GI.     Hospital Course:  Principal Problem:   Ascites/ Cirrhosis - the patient dose tell me that his PCP has told him in the past that he had a fatty liver  - CT (see above) showing cirrhosis - Ascitic fluid was not sent for further labwork - the family asks about another paracentesis-  I have  discussed the need for diuretics with the patient and his son and they appear to understand that this is how he will be managed- the patient understands she should not drink too much water -  GI has recommended Lasix 20 mg daily and Spironolactone 25 mg daily - GI has further recommended outpt eval for cause of cirrhosis- Dr Alessandra Bevels will see him in 4 wks - AFP, Mitochondrial Ab, Anti sm muscle Ab pending - Hepatitis panel neg, HIV non-reactive - recommend  PCP (f/u is in 2 days) and Nephrology (has appt 4/17) to help manage fluid status/ electrolytes as outpt  Active Problems:  CT also showing Infrarenal AAA - 4.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 4.6- patient is aware and states his PCP is following    Normochromic normocytic anemia - stable    Hypotension (h/o Essential hypertension) - taking Coreg, HCTZ, Ibesartan and Amlodipine at home - cont Coreg for portal HTN- d/c HCTZ and Amlodipine- now on Lasix and Spironolactone - will need to monitor BP at home- I have asked him to do this    CKD (chronic kidney disease) stage 3, GFR 30-59 ml/min  - follow on diuretics    HLD (hyperlipidemia) - statin    Hypothyroidism - synthroid  Gout - Colchicine  Crackles RLL - no cough, no signs of infection  Discharge Exam: Vitals:   05/20/17 2123 05/21/17 0549  BP: 115/67 126/76  Pulse: 73 78  Resp: 17 18  Temp: 99.1 F (37.3 C) 99.3 F (37.4 C)  SpO2: 95% 97%   Vitals:   05/20/17 0743 05/20/17  1412 05/20/17 2123 05/21/17 0549  BP: 104/66 109/67 115/67 126/76  Pulse: 70 65 73 78  Resp: 18  17 18   Temp: 98.7 F (37.1 C) 98.7 F (37.1 C) 99.1 F (37.3 C) 99.3 F (37.4 C)  TempSrc: Oral Oral Oral Oral  SpO2: 95% 98% 95% 97%  Weight:      Height:        General: Pt is alert, awake, not in acute distress Cardiovascular: RRR, S1/S2 +, no rubs, no gallops Respiratory: CTA bilaterally, no wheezing, no rhonchi Abdominal: Soft, NT, moderately distended- + fluid wave, bowel sounds  + Extremities: no edema, no cyanosis   Discharge Instructions  Discharge Instructions    Diet - low sodium heart healthy   Complete by:  As directed    Increase activity slowly   Complete by:  As directed      Allergies as of 05/21/2017      Reactions   Cephalexin Hives   Hydralazine Hives      Medication List    STOP taking these medications   amLODipine 10 MG tablet Commonly known as:  NORVASC   hydrochlorothiazide 25 MG tablet Commonly known as:  HYDRODIURIL   irbesartan 150 MG tablet Commonly known as:  AVAPRO     TAKE these medications   atorvastatin 10 MG tablet Commonly known as:  LIPITOR Take 10 mg by mouth daily at 6 PM.   carvedilol 12.5 MG tablet Commonly known as:  COREG Take 12.5 mg by mouth 2 (two) times daily.   colchicine 0.6 MG tablet Take 0.6 mg by mouth daily.   febuxostat 40 MG tablet Commonly known as:  ULORIC Take 40 mg by mouth daily.   furosemide 20 MG tablet Commonly known as:  LASIX Take 1 tablet (20 mg total) by mouth daily. Start taking on:  05/22/2017   levothyroxine 25 MCG tablet Commonly known as:  SYNTHROID, LEVOTHROID Take 25 mcg by mouth every morning.   ondansetron 4 MG tablet Commonly known as:  ZOFRAN Take 4 mg by mouth every 8 (eight) hours as needed for nausea/vomiting.   spironolactone 25 MG tablet Commonly known as:  ALDACTONE Take 1 tablet (25 mg total) by mouth daily. Start taking on:  05/22/2017      Follow-up Information    [name]., MD Follow up in 1 week(s).   Specialty:  Internal Medicine Why:  He will need a Bmet as he has newly started Furosemide and Spironolactone Contact information: [place] [address] [phone]        Otis Brace, MD. Schedule an appointment as soon as possible for a visit in 4 week(s).   Specialty:  Gastroenterology Why:  Follow up for cirrhosis and ascites. Contact information: [address] [place]  [address] [phone]          Allergies  Allergen Reactions  . Cephalexin Hives  . Hydralazine Hives     Procedures/Studies: none  Dg Chest Port 1 View  Result Date: 05/20/2017 CLINICAL DATA:  Shortness of breath. EXAM: PORTABLE CHEST 1 VIEW COMPARISON:  Lung bases from abdominal CT 05/16/2017 FINDINGS: Left lung base opacity represents pleural effusion and atelectasis is seen on recent CT. Right lung is clear. No pulmonary edema. Normal heart size. Aortic atherosclerosis. No pneumothorax. IMPRESSION: Left lung base opacity represents atelectasis and pleural effusion as seen on recent abdominal CT. Aortic Atherosclerosis (ICD10-I70.0). Electronically Signed   By: Jeb Levering M.D.   On: 05/20/2017  02:53     The results of significant diagnostics from this hospitalization (including imaging, microbiology, ancillary and laboratory) are listed below for reference.     Microbiology: No results found for this or any previous visit (from the past 240 hour(s)).   Labs: BNP (last 3 results) No results for input(s): BNP in the last 8760 hours. Basic Metabolic Panel: Recent Labs  Lab 05/20/17 0051 05/20/17 0557 05/21/17 0537  NA 136 139 138  K 3.6 3.7 3.9  CL 108 110 110  CO2 18* 20* 22  GLUCOSE 126* 93 103*  BUN 33* 33* 31*  CREATININE 1.99* 1.95* 2.06*  CALCIUM 7.4* 7.7* 7.7*   Liver Function Tests: Recent Labs  Lab 05/20/17 0051 05/21/17 0537  AST 59* 38  ALT 24 25  ALKPHOS 248* 240*  BILITOT 1.2 1.7*  PROT 4.6* 4.7*  ALBUMIN 2.3* 2.3*   No results for input(s): LIPASE, AMYLASE in the last 168 hours. No results for input(s): AMMONIA in the last 168 hours. CBC: Recent Labs  Lab 05/20/17 0051 05/20/17 0557  WBC 5.2 4.6  NEUTROABS 3.7  --   HGB 10.6* 11.2*  HCT 31.8* 33.5*  MCV 96.1 97.1  PLT 90* 90*   Cardiac Enzymes: No results for input(s): CKTOTAL, CKMB, CKMBINDEX, TROPONINI in the last 168 hours. BNP: Invalid input(s): POCBNP CBG: No results  for input(s): GLUCAP in the last 168 hours. D-Dimer No results for input(s): DDIMER in the last 72 hours. Hgb A1c No results for input(s): HGBA1C in the last 72 hours. Lipid Profile No results for input(s): CHOL, HDL, LDLCALC, TRIG, CHOLHDL, LDLDIRECT in the last 72 hours. Thyroid function studies No results for input(s): TSH, T4TOTAL, T3FREE, THYROIDAB in the last 72 hours.  Invalid input(s): FREET3 Anemia work up Recent Labs    05/20/17 0557  VITAMINB12 983*  FOLATE 7.4  FERRITIN 193  TIBC 155*  IRON 59  RETICCTPCT 2.0   Urinalysis    Component Value Date/Time   COLORURINE YELLOW 05/20/2017 0206   APPEARANCEUR CLEAR 05/20/2017 0206   LABSPEC 1.008 05/20/2017 0206   PHURINE 5.0 05/20/2017 0206   GLUCOSEU NEGATIVE 05/20/2017 0206   HGBUR NEGATIVE 05/20/2017 0206   BILIRUBINUR NEGATIVE 05/20/2017 0206   KETONESUR NEGATIVE 05/20/2017 0206   PROTEINUR NEGATIVE 05/20/2017 0206   NITRITE NEGATIVE 05/20/2017 0206   LEUKOCYTESUR NEGATIVE 05/20/2017 0206   Sepsis Labs Invalid input(s): PROCALCITONIN,  WBC,  LACTICIDVEN Microbiology No results found for this or any previous visit (from the past 240 hour(s)).   Time coordinating discharge: Over 30 minutes  SIGNED:   Debbe Odea, MD  [facility] 05/21/2017, 10:27 AM Pager   If 7PM-7AM, please contact night-coverage [email or web address] Password [email or web address]

## 2017-05-21 NOTE — Progress Notes (Signed)
Assisted pt to sit in the chair today, shaky and weak. Per pt he did not got out of bed for several days due to fatigue. Encouraged pt to sit up longer today and to ambulate.  Discharge instructions given to pt and girlfriend. Verbalized understanding. Discharged to home accompanied by girlfriend.

## 2017-05-21 NOTE — Progress Notes (Addendum)
[facility] Progress Note  Ricky Harris 77 y.o. February 14, 1941  CC:  ascites   Subjective: No new GI symptoms. Denied abdominal pain. Denied nausea and vomiting.  ROS : negative for chest pain and shortness of breath   Objective: Vital signs in last 24 hours: Vitals:   05/20/17 2123 05/21/17 0549  BP: 115/67 126/76  Pulse: 73 78  Resp: 17 18  Temp: 99.1 F (37.3 C) 99.3 F (37.4 C)  SpO2: 95% 97%    Physical Exam:  General:  Alert, cooperative, no distress, appears stated age  Head:  Normocephalic, without obvious abnormality, atraumatic  Eyes:  , EOM's intact,   Lungs:   Clear to auscultation bilaterally, respirations unlabored  Heart:  Regular rate and rhythm, S1, S2 normal  Abdomen:   Abdomen distended, nontender, bowel present. Very small  area of fluid collection at the paracentesis site noted.  Extremities: Extremities normal, atraumatic, no  edema   Lab Results: Recent Labs    05/20/17 0557 05/21/17 0537  NA 139 138  K 3.7 3.9  CL 110 110  CO2 20* 22  GLUCOSE 93 103*  BUN 33* 31*  CREATININE 1.95* 2.06*  CALCIUM 7.7* 7.7*   Recent Labs    05/20/17 0051 05/21/17 0537  AST 59* 38  ALT 24 25  ALKPHOS 248* 240*  BILITOT 1.2 1.7*  PROT 4.6* 4.7*  ALBUMIN 2.3* 2.3*   Recent Labs    05/20/17 0051 05/20/17 0557  WBC 5.2 4.6  NEUTROABS 3.7  --   HGB 10.6* 11.2*  HCT 31.8* 33.5*  MCV 96.1 97.1  PLT 90* 90*   Recent Labs    05/20/17 0051  LABPROT 16.5*  INR 1.34      Assessment/Plan: - new-onset ascites. Most likely from underlying cirrhosis. Status post paracentesis with removal of 5.5 L of fluid at [facility]. Fluid analysis not performed based on fax received from [facility]  - CT scan showing changes of cirrhosis. MELD score 17 . Patient with low platelet count of 99. History of fatty liver disease in the past.No alcohol use.  - chronic kidney disease/ Stage III - History of subtotal gastrectomy for bleeding  ulcer 30 years ago. - weight loss. Concerning for underlying malignancy.Elevated alkaline phosphatase. Underlying hepatocellular carcinoma cannot be ruled out based on noncontrasted CT scan.had colonoscopy 2 years ago.  Recommendations ------------------------ - received faxed from [facility]. They have  mention that there was no fluid analysis performed on ascitic  Fluid - hepatitis panel negative. Normal situation.ferritin 193. - AFP, AMA, ASMA pending.  - continue Lasix 20 mg and spironolactone 25 mg for now. Patient has follow-up with primary doctor on Tuesday as well as nephrologist on April 17. Further management of diuretics to be done by nephrologist. - Recommend outpatient workup for cirrhosis, ascites and weight loss. - 2 g sodium diet. - okay to discharge from GI standpoint. GI will sign off. Follow-up in GI clinic with me in 4 weeks.   Otis Brace MD, [place] 05/21/2017, 9:53 AM  Contact #  [phone]

## 2017-05-22 LAB — ANTI-SMOOTH MUSCLE ANTIBODY, IGG: F-Actin IgG: 9 Units (ref 0–19)

## 2017-05-22 LAB — MITOCHONDRIAL ANTIBODIES: Mitochondrial M2 Ab, IgG: <20 Units (ref 0.0–20.0)

## 2017-05-29 DIAGNOSIS — K7460 Unspecified cirrhosis of liver: Secondary | ICD-10-CM | POA: Insufficient documentation

## 2017-06-14 ENCOUNTER — Other Ambulatory Visit: Payer: Self-pay | Admitting: Gastroenterology

## 2017-07-04 ENCOUNTER — Other Ambulatory Visit: Payer: Self-pay

## 2017-07-04 ENCOUNTER — Encounter (HOSPITAL_COMMUNITY): Payer: Self-pay | Admitting: *Deleted

## 2017-07-04 NOTE — Progress Notes (Signed)
Anesthesia Chart Review:  Pt is a same day work up   Case:  [id number] Date/Time:  07/05/17 0900   Procedures:      ESOPHAGOGASTRODUODENOSCOPY (EGD) WITH PROPOFOL (N/A )     ESOPHAGEAL BANDING (N/A )   Anesthesia type:  Monitor Anesthesia Care   Pre-op diagnosis:  Liver Cirrhosis   Location:  [facility] ROOM 1 / [facility]   Surgeon:  Otis Brace, MD      DISCUSSION:  - Pt is a 77 year old male.  - Hospitalized 3/22-24/19 at [facility] (transferred from [facility]) for ascites.  Dx cirrhosis. Underwent paracentesis at [facility] 05/17/17.  - Has AAA (4.8 x 4.6 cm by 05/16/17 CT)   PROVIDERS: [name]., MD (notes in care everywhere)   Saw cardiologist Alinda Money, MD 01/2017 for pre-op eval prior to hip replacement (I do not see this surgery has happened).  Echo and stress test ordered, results below.    LABS: Will be obtained day of surgery  - Pt has CKD, baseline Cr ~ 2.25  IMAGES:  1 view CXR 05/20/17:  - Left lung base opacity represents atelectasis and pleural effusion as seen on recent abdominal CT. - Aortic Atherosclerosis   CT abd/pelvis 05/16/17 (in correspondence 05/23/17 in media tab, pg 43) 1.  Extensive ascites throughout the abdomen and pelvis.  No evident abscess with mid abdomen or pelvis. 2.  Postoperative change in the right inguinal region region.  In the area of postoperative change, there is a small amount of soft tissue air and fluid which may be expected postoperative changes.  There remains evidence of hernia in the right inguinal region with soft tissue and fat stranding.  A small loop of small bowel extends into the proximal most aspect of the hernia without bowel compromise. 3.  Infrarenal abdominal aortic aneurysm measuring 4.8 x 4.6 cm, slightly larger compared to 5 months previously.  Recommend follow-up by abdomen pelvis CTA in 6 months, and vascular surgery referral/consultation if not already obtained. 4.  Small left pleural  effusion with patchy consolidation left lung base. 5.  Question a degree of underlying hepatic cirrhosis.  Liver has a subtly nodular contour.  No focal liver lesions on this non contrast enhanced study. 6.  Spinal stenosis at L4-5 due to disc protrusion and bony hypertrophy.  Borderline stenosis L3-4 due to similar factors. 7.  Aortoiliac atherosclerosis.  Apparently hemodynamic significant obstruction at the origins of each renal artery.  This is a finding that may lead to renovascular type hypertension. 8.  Postoperative change in the distal stomach region.  Nearby 5 cm diverticulum, without complicating features. 9.  2 mm nonobstructing calculus in the lower pole right kidney.  No hydronephrosis on either side. 10.  Prostate absent.  Penile prosthesis present with reservoirs and anterior pelvis on each side. 11.  Gallbladder absent.   EKG 12/26/16 (Dr. Gilman Schmidt office):  Sinus bradycardia (55 bpm).    CV:  Echo 03/30/17 (Dr. Gilman Schmidt office):  1.  Technically adequate study. 2.  LV cavity normal in size.  Mild concentric LVH.  Normal global wall motion.  Calculated EF 60%. 2.  LA cavity mildly dilated. 3.  Trileaflet aortic valve with no regurgitation noted.  Moderate sclerosis of the aortic valve. Mildly restricted aortic valve leaflets, no significant gradient recorded. 4.  Structurally normal mitral valve with trace regurgitation. 5.  Mildly dilated ascending aorta measuring 4.0 cm  Nuclear stress test 03/06/17 (Dr. Gilman Schmidt office):  -  Uneventful lexiscan injection. Normal SPECT perfusion imaging.  No significant ischemia detected.  No evidence of infarct.  Clinically negative stress test with no chest pain.  Normal LV systolic function.  Normal BP response to stress.     Past Medical History:  Diagnosis Date  . AAA (abdominal aortic aneurysm) ([place])   . Aortic stenosis   . Arthritis    knees  . Cancer [facility])    prostate  . Chronic kidney disease    stage 4  . Cirrhosis of liver  not due to alcohol ([place])   . Coronary artery disease    on CT scan  . Gout   . H/O hypercholesterolemia   . Hypertension   . Hypothyroidism   . Renal artery stenosis ([place])   . Thrombocytopenia ([place])   . Wears glasses     Past Surgical History:  Procedure Laterality Date  . CATARACT EXTRACTION W/ INTRAOCULAR LENS  IMPLANT, BILATERAL    . CHOLECYSTECTOMY    . HERNIA REPAIR    . PROSTATECTOMY    . subtotal gastrectomy      MEDICATIONS: No current facility-administered medications for this encounter.    Marland Kitchen aspirin EC 81 MG tablet  . atorvastatin (LIPITOR) 10 MG tablet  . carvedilol (COREG) 12.5 MG tablet  . cloNIDine (CATAPRES) 0.1 MG tablet  . colchicine 0.6 MG tablet  . febuxostat (ULORIC) 40 MG tablet  . furosemide (LASIX) 20 MG tablet  . levothyroxine (SYNTHROID, LEVOTHROID) 25 MCG tablet  . Multiple Vitamins-Minerals (MULTIVITAMIN PO)  . ondansetron (ZOFRAN) 4 MG tablet  . spironolactone (ALDACTONE) 25 MG tablet  . traMADol (ULTRAM) 50 MG tablet    If labs acceptable day of surgery, I anticipate pt can proceed with surgery as scheduled.  Willeen Cass, FNP-BC [facility]/Anesthesiology Phone: [phone] 07/04/2017 12:18 PM

## 2017-07-05 ENCOUNTER — Other Ambulatory Visit: Payer: Self-pay

## 2017-07-05 ENCOUNTER — Ambulatory Visit (HOSPITAL_COMMUNITY)
Admission: RE | Admit: 2017-07-05 | Discharge: 2017-07-05 | Disposition: A | Discharge status: Home / Self Care | Payer: Medicare Other | Source: Ambulatory Visit | Attending: Gastroenterology | Admitting: Gastroenterology

## 2017-07-05 ENCOUNTER — Encounter (HOSPITAL_COMMUNITY): Payer: Self-pay | Admitting: Certified Registered"

## 2017-07-05 ENCOUNTER — Encounter (HOSPITAL_COMMUNITY)
Admission: RE | Disposition: A | Discharge status: Home / Self Care | Payer: Self-pay | Source: Ambulatory Visit | Attending: Gastroenterology

## 2017-07-05 ENCOUNTER — Ambulatory Visit (HOSPITAL_COMMUNITY): Payer: Medicare Other | Admitting: Emergency Medicine

## 2017-07-05 DIAGNOSIS — I129 Hypertensive chronic kidney disease with stage 1 through stage 4 chronic kidney disease, or unspecified chronic kidney disease: Secondary | ICD-10-CM | POA: Diagnosis not present

## 2017-07-05 DIAGNOSIS — Z79899 Other long term (current) drug therapy: Secondary | ICD-10-CM | POA: Insufficient documentation

## 2017-07-05 DIAGNOSIS — I8510 Secondary esophageal varices without bleeding: Secondary | ICD-10-CM | POA: Insufficient documentation

## 2017-07-05 DIAGNOSIS — N184 Chronic kidney disease, stage 4 (severe): Secondary | ICD-10-CM | POA: Diagnosis not present

## 2017-07-05 DIAGNOSIS — Z8546 Personal history of malignant neoplasm of prostate: Secondary | ICD-10-CM | POA: Diagnosis not present

## 2017-07-05 DIAGNOSIS — E039 Hypothyroidism, unspecified: Secondary | ICD-10-CM | POA: Insufficient documentation

## 2017-07-05 DIAGNOSIS — I714 Abdominal aortic aneurysm, without rupture: Secondary | ICD-10-CM | POA: Insufficient documentation

## 2017-07-05 DIAGNOSIS — K2950 Unspecified chronic gastritis without bleeding: Secondary | ICD-10-CM | POA: Insufficient documentation

## 2017-07-05 DIAGNOSIS — E7800 Pure hypercholesterolemia, unspecified: Secondary | ICD-10-CM | POA: Insufficient documentation

## 2017-07-05 DIAGNOSIS — K7460 Unspecified cirrhosis of liver: Secondary | ICD-10-CM | POA: Insufficient documentation

## 2017-07-05 DIAGNOSIS — Z980 Intestinal bypass and anastomosis status: Secondary | ICD-10-CM | POA: Diagnosis not present

## 2017-07-05 DIAGNOSIS — Z7982 Long term (current) use of aspirin: Secondary | ICD-10-CM | POA: Insufficient documentation

## 2017-07-05 HISTORY — DX: Atherosclerosis of renal artery: I70.1

## 2017-07-05 HISTORY — DX: Presence of spectacles and contact lenses: Z97.3

## 2017-07-05 HISTORY — DX: Unspecified osteoarthritis, unspecified site: M19.90

## 2017-07-05 HISTORY — DX: Gout, unspecified: M10.9

## 2017-07-05 HISTORY — DX: Nonrheumatic aortic (valve) stenosis: I35.0

## 2017-07-05 HISTORY — DX: Malignant (primary) neoplasm, unspecified: C80.1

## 2017-07-05 HISTORY — DX: Abdominal aortic aneurysm, without rupture: I71.4

## 2017-07-05 HISTORY — DX: Unspecified cirrhosis of liver: K74.60

## 2017-07-05 HISTORY — DX: Abdominal aortic aneurysm, without rupture, unspecified: I71.40

## 2017-07-05 HISTORY — DX: Atherosclerotic heart disease of native coronary artery without angina pectoris: I25.10

## 2017-07-05 HISTORY — DX: Thrombocytopenia, unspecified: D69.6

## 2017-07-05 HISTORY — PX: ESOPHAGOGASTRODUODENOSCOPY (EGD) WITH PROPOFOL: SHX5813

## 2017-07-05 HISTORY — DX: Personal history of other endocrine, nutritional and metabolic disease: Z86.39

## 2017-07-05 LAB — COMPREHENSIVE METABOLIC PANEL
ALT: 13 U/L — AB (ref 17–63)
ANION GAP: 7 (ref 5–15)
AST: 31 U/L (ref 15–41)
Albumin: 2.2 g/dL — ABNORMAL LOW (ref 3.5–5.0)
Alkaline Phosphatase: 202 U/L — ABNORMAL HIGH (ref 38–126)
BILIRUBIN TOTAL: 1.3 mg/dL — AB (ref 0.3–1.2)
BUN: 33 mg/dL — ABNORMAL HIGH (ref 6–20)
CO2: 22 mmol/L (ref 22–32)
CREATININE: 1.8 mg/dL — AB (ref 0.61–1.24)
Calcium: 7.7 mg/dL — ABNORMAL LOW (ref 8.9–10.3)
Chloride: 114 mmol/L — ABNORMAL HIGH (ref 101–111)
GFR, EST AFRICAN AMERICAN: 41 mL/min — AB (ref >60)
GFR, EST NON AFRICAN AMERICAN: 35 mL/min — AB (ref >60)
Glucose, Bld: 109 mg/dL — ABNORMAL HIGH (ref 65–99)
Potassium: 3.3 mmol/L — ABNORMAL LOW (ref 3.5–5.1)
Sodium: 143 mmol/L (ref 135–145)
TOTAL PROTEIN: 4.6 g/dL — AB (ref 6.5–8.1)

## 2017-07-05 LAB — CBC
HEMATOCRIT: 28.6 % — AB (ref 39.0–52.0)
HEMOGLOBIN: 9.3 g/dL — AB (ref 13.0–17.0)
MCH: 32.6 pg (ref 26.0–34.0)
MCHC: 32.5 g/dL (ref 30.0–36.0)
MCV: 100.4 fL — ABNORMAL HIGH (ref 78.0–100.0)
Platelets: 62 10*3/uL — ABNORMAL LOW (ref 150–400)
RBC: 2.85 MIL/uL — AB (ref 4.22–5.81)
RDW: 15.7 % — ABNORMAL HIGH (ref 11.5–15.5)
WBC: 2.8 10*3/uL — ABNORMAL LOW (ref 4.0–10.5)

## 2017-07-05 LAB — PROTIME-INR
INR: 1.45
PROTHROMBIN TIME: 17.5 s — AB (ref 11.4–15.2)

## 2017-07-05 SURGERY — ESOPHAGOGASTRODUODENOSCOPY (EGD) WITH PROPOFOL
Anesthesia: Monitor Anesthesia Care

## 2017-07-05 MED ORDER — EPHEDRINE SULFATE-NACL 50-0.9 MG/10ML-% IV SOSY
PREFILLED_SYRINGE | INTRAVENOUS | Status: DC | PRN
  Administered 2017-07-05: 10 mg via INTRAVENOUS

## 2017-07-05 MED ORDER — LIDOCAINE 2% (20 MG/ML) 5 ML SYRINGE
INTRAMUSCULAR | Status: DC | PRN
  Administered 2017-07-05: 80 mg via INTRAVENOUS

## 2017-07-05 MED ORDER — GLYCOPYRROLATE 0.2 MG/ML IV SOSY
PREFILLED_SYRINGE | INTRAVENOUS | Status: DC | PRN
  Administered 2017-07-05: .2 mg via INTRAVENOUS

## 2017-07-05 MED ORDER — PROPOFOL 500 MG/50ML IV EMUL
INTRAVENOUS | Status: DC | PRN
  Administered 2017-07-05: 100 ug/kg/min via INTRAVENOUS

## 2017-07-05 MED ORDER — SODIUM CHLORIDE 0.9 % IV SOLN
INTRAVENOUS | Status: DC
  Administered 2017-07-05: 09:00:00 via INTRAVENOUS

## 2017-07-05 MED ORDER — PROPOFOL 10 MG/ML IV BOLUS
INTRAVENOUS | Status: DC | PRN
  Administered 2017-07-05: 50 mg via INTRAVENOUS
  Administered 2017-07-05: 20 mg via INTRAVENOUS

## 2017-07-05 MED ORDER — OMEPRAZOLE 20 MG PO CPDR: 20.0000 mg | DELAYED_RELEASE_CAPSULE | Freq: Every day | ORAL | 3 refills | Status: DC

## 2017-07-05 SURGICAL SUPPLY — 15 items

## 2017-07-05 NOTE — Discharge Instructions (Signed)
YOU HAD AN ENDOSCOPIC PROCEDURE TODAY: Refer to the procedure report and other information in the discharge instructions given to you for any specific questions about what was found during the examination. If this information does not answer your questions, please call [facility] office at [phone] to clarify.  ° °YOU SHOULD EXPECT: Some feelings of bloating in the abdomen. Passage of more gas than usual. Walking can help get rid of the air that was put into your GI tract during the procedure and reduce the bloating. ° °DIET: Your first meal following the procedure should be a light meal and then it is ok to progress to your normal diet. A half-sandwich or bowl of soup is an example of a good first meal. Heavy or fried foods are harder to digest and may make you feel nauseous or bloated. Drink plenty of fluids but you should avoid alcoholic beverages for 24 hours. If you had a esophageal dilation, please see attached instructions for diet.  ° °ACTIVITY: Your care partner should take you home directly after the procedure. You should plan to take it easy, moving slowly for the rest of the day. You can resume normal activity the day after the procedure however YOU SHOULD NOT DRIVE, use power tools, machinery or perform tasks that involve climbing or major physical exertion for 24 hours (because of the sedation medicines used during the test).  ° °SYMPTOMS TO REPORT IMMEDIATELY: °A gastroenterologist can be reached at any hour. Please call [phone]  for any of the following symptoms:  ° °Following upper endoscopy (EGD, EUS, ERCP, esophageal dilation) °Vomiting of blood or coffee ground material  °New, significant abdominal pain  °New, significant chest pain or pain under the shoulder blades  °Painful or persistently difficult swallowing  °New shortness of breath  °Black, tarry-looking or red, bloody stools ° °FOLLOW UP:  °If any biopsies were taken you will be contacted by phone or by letter within the next 1-3  weeks. Call [phone]  if you have not heard about the biopsies in 3 weeks.  °Please also call with any specific questions about appointments or follow up tests. ° °

## 2017-07-05 NOTE — Anesthesia Procedure Notes (Signed)
Procedure Name: MAC Date/Time: 07/05/2017 9:17 AM Performed by: Imagene Riches, CRNA Pre-anesthesia Checklist: Patient identified, Emergency Drugs available, Suction available and Patient being monitored Patient Re-evaluated:Patient Re-evaluated prior to induction Oxygen Delivery Method: Nasal cannula Induction Type: IV induction

## 2017-07-05 NOTE — Transfer of Care (Signed)
Immediate Anesthesia Transfer of Care Note  Patient: Ricky Harris  Procedure(s) Performed: ESOPHAGOGASTRODUODENOSCOPY (EGD) WITH PROPOFOL (N/A ) ESOPHAGEAL BANDING (N/A )  Patient Location: PACU  Anesthesia Type:MAC  Level of Consciousness: drowsy  Airway & Oxygen Therapy: Patient Spontanous Breathing and Patient connected to nasal cannula oxygen  Post-op Assessment: Report given to RN and Post -op Vital signs reviewed and stable  Post vital signs: Reviewed and stable  Last Vitals:  Vitals Value Taken Time  BP 83/52 07/05/2017  9:29 AM  Temp    Pulse 52 07/05/2017  9:29 AM  Resp 14 07/05/2017  9:29 AM  SpO2 100 % 07/05/2017  9:29 AM    Last Pain:  Vitals:   07/05/17 0929  TempSrc: Oral  PainSc:          Complications: No apparent anesthesia complications

## 2017-07-05 NOTE — Op Note (Signed)
[facility] Patient Name: Ricky Harris Procedure Date : 07/05/2017 MRN: [id number] Attending MD: Otis Brace , MD Date of Birth: 05/17/1940 CSN: [id number] Age: 77 Admit Type: Inpatient Procedure:                Upper GI endoscopy Indications:              Cirrhosis with suspected esophageal varices Providers:                Otis Brace, MD, Burtis Junes, RN, Alan Mulder,                            Technician Referring MD:              Medicines:                Sedation Administered by an Anesthesia Professional Complications:            No immediate complications. Estimated Blood Loss:     patient had significant oozing of blood just from                            the biopsies. band ligation was not performed. We                            will get blood work today and reschedule procedure                            for band ligation if blood work in acceptable range. Procedure:                Pre-Anesthesia Assessment:                           - Prior to the procedure, a History and Physical                            was performed, and patient medications and                            allergies were reviewed. The patient's tolerance of                            previous anesthesia was also reviewed. The risks                            and benefits of the procedure and the sedation                            options and risks were discussed with the patient.                            All questions were answered, and informed consent                            was obtained. Prior Anticoagulants: The patient has  taken no previous anticoagulant or antiplatelet                            agents. ASA Grade Assessment: II - A patient with                            mild systemic disease. After reviewing the risks                            and benefits, the patient was deemed in                            satisfactory condition to  undergo the procedure.                           After obtaining informed consent, the endoscope was                            passed under direct vision. Throughout the                            procedure, the patient's blood pressure, pulse, and                            oxygen saturations were monitored continuously. The                            EG-[id number] ([id number]) scope was introduced through the                            mouth, and advanced to the third part of duodenum.                            The upper GI endoscopy was accomplished without                            difficulty. The patient tolerated the procedure                            well. Scope In: Scope Out: Findings:      Three columns of non-bleeding large (> 5 mm) varices were found in the       distal esophagus,. No stigmata of recent bleeding were evident and no       red wale signs were present.      Diffuse mild inflammation characterized by congestion (edema) and       erythema was found in the entire examined stomach. Biopsies were taken       with a cold forceps for histology.      Evidence of a Billroth I anastomosis was found in the gastric antrum.       This was characterized by congestion.      The cardia and gastric fundus were normal on retroflexion.      Normal mucosa was found in the entire duodenum. Biopsies were taken with  a cold forceps for histology. Estimated blood loss: Minimal.      - patient had significant oozing of blood just from the biopsies. band       ligation was not performed. We will get blood work today and reschedule       procedure for band ligation if blood work in acceptable range. 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                           - Gastritis. Biopsied.                           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                           - Normal mucosa was  found in the entire examined                            duodenum. Biopsied. Recommendation:           - The patient will be observed post-procedure,                            until all discharge criteria are met.                           - Discharge patient to home (ambulatory).                           - Resume previous diet.                           - Continue present medications.                           - Await pathology results.                           - Repeat upper endoscopy at appointment to be                            scheduled for endoscopic band ligation.                           - Return to my office as previously scheduled. Procedure Code(s):        --- Professional ---                           [phone], Esophagogastroduodenoscopy, flexible,                            transoral; with biopsy, single or multiple Diagnosis Code(s):        --- Professional ---  K74.60, Unspecified cirrhosis of liver                           I85.10, Secondary esophageal varices without                            bleeding                           K29.70, Gastritis, unspecified, without bleeding                           Z98.0, Intestinal bypass and anastomosis status CPT copyright 2017 American Medical Association. All rights reserved. The codes documented in this report are preliminary and upon coder review may  be revised to meet current compliance requirements. Otis Brace, MD Otis Brace, MD 07/05/2017 9:40:22 AM Number of Addenda: 0

## 2017-07-05 NOTE — H&P (Signed)
Ricky Harris is a 77 y.o. male has presented today for screening of esophageal varices. Patient with the past medical history of cirrhosis. The various methods of treatment have been discussed with the patient and family. After consideration of risks, benefits and other options for treatment, the patient has consented to  Procedure(s):esophago-duodenoscopy with possible band ligation as a surgical intervention .  The patient's history has been reviewed, patient examined, no change in status, stable for surgery.  I have reviewed the patient's chart and labs.  Questions were answered to the patient's satisfaction.   Risks (bleeding, infection, bowel perforation that could require surgery, sedation-related changes in cardiopulmonary systems), benefits (identification and possible treatment of source of symptoms, exclusion of certain causes of symptoms), and alternatives (watchful waiting, radiographic imaging studies, empiric medical treatment)  were explained to patient and family in detail and patient wishes to proceed.  Otis Brace MD, [place] 07/05/2017, 8:44 AM  Contact #  [phone]

## 2017-07-05 NOTE — Anesthesia Preprocedure Evaluation (Signed)
Anesthesia Evaluation  Patient identified by MRN, date of birth, ID band Patient awake    Reviewed: Allergy & Precautions, NPO status , Patient's Chart, lab work & pertinent test results  Airway Mallampati: II  TM Distance: >3 FB Neck ROM: Full    Dental no notable dental hx.    Pulmonary neg pulmonary ROS,    Pulmonary exam normal breath sounds clear to auscultation       Cardiovascular hypertension, Pt. on medications Normal cardiovascular exam(-) Valvular Problems/Murmurs Rhythm:Regular Rate:Normal  AAA   Neuro/Psych negative neurological ROS  negative psych ROS   GI/Hepatic negative GI ROS, (+) Cirrhosis       ,   Endo/Other  negative endocrine ROS  Renal/GU negative Renal ROS  negative genitourinary   Musculoskeletal negative musculoskeletal ROS (+)   Abdominal   Peds negative pediatric ROS (+)  Hematology  (+) Blood dyscrasia, , Thrombocytopenia. PLT 90k   Anesthesia Other Findings   Reproductive/Obstetrics negative OB ROS                             Anesthesia Physical Anesthesia Plan  ASA: II  Anesthesia Plan: MAC   Post-op Pain Management:    Induction:   PONV Risk Score and Plan: 1 and Ondansetron  Airway Management Planned: Nasal Cannula  Additional Equipment:   Intra-op Plan:   Post-operative Plan:   Informed Consent: I have reviewed the patients History and Physical, chart, labs and discussed the procedure including the risks, benefits and alternatives for the proposed anesthesia with the patient or authorized representative who has indicated his/her understanding and acceptance.   Dental advisory given  Plan Discussed with: CRNA  Anesthesia Plan Comments:         Anesthesia Quick Evaluation

## 2017-07-06 NOTE — Anesthesia Postprocedure Evaluation (Signed)
Anesthesia Post Note  Patient: Ricky Harris  Procedure(s) Performed: ESOPHAGOGASTRODUODENOSCOPY (EGD) WITH PROPOFOL (N/A ) ESOPHAGEAL BANDING (N/A )     Patient location during evaluation: Endoscopy Anesthesia Type: MAC Level of consciousness: awake and alert Pain management: pain level controlled Vital Signs Assessment: post-procedure vital signs reviewed and stable Respiratory status: spontaneous breathing, nonlabored ventilation, respiratory function stable and patient connected to nasal cannula oxygen Cardiovascular status: stable and blood pressure returned to baseline Postop Assessment: no apparent nausea or vomiting Anesthetic complications: no    Last Vitals:  Vitals:   07/05/17 0936 07/05/17 0950  BP: 113/65 131/71  Pulse: (!) 54 (!) 53  Resp: 13 14  Temp:    SpO2: 100% 100%    Last Pain:  Vitals:   07/05/17 0950  TempSrc:   PainSc: 0-No pain                 [name]

## 2017-07-07 ENCOUNTER — Encounter (HOSPITAL_COMMUNITY): Payer: Self-pay | Admitting: Gastroenterology

## 2017-07-18 ENCOUNTER — Other Ambulatory Visit: Payer: Self-pay | Admitting: Gastroenterology

## 2017-08-10 ENCOUNTER — Encounter (HOSPITAL_COMMUNITY): Payer: Self-pay | Admitting: *Deleted

## 2017-08-10 ENCOUNTER — Other Ambulatory Visit: Payer: Self-pay

## 2017-08-10 ENCOUNTER — Other Ambulatory Visit (HOSPITAL_COMMUNITY): Payer: Self-pay

## 2017-08-10 NOTE — Progress Notes (Signed)
Pt denies any acute cardiopulmonary issues. Pt under the care of Dr. Atilano Median. Pt made aware to stop taking vitamins, fish oil and herbal medications. Do not take any NSAIDs ie: Ibuprofen, Advil, Naproxen(Aleve), Motrin, BC and Goody Powder. Pt verbalized understanding of all pre-op instructions.

## 2017-08-11 ENCOUNTER — Ambulatory Visit (HOSPITAL_COMMUNITY)
Admission: RE | Admit: 2017-08-11 | Discharge: 2017-08-11 | Disposition: A | Discharge status: Home / Self Care | Payer: Medicare Other | Source: Ambulatory Visit | Attending: Gastroenterology | Admitting: Gastroenterology

## 2017-08-11 ENCOUNTER — Encounter (HOSPITAL_COMMUNITY)
Admission: RE | Disposition: A | Discharge status: Home / Self Care | Payer: Self-pay | Source: Ambulatory Visit | Attending: Gastroenterology

## 2017-08-11 ENCOUNTER — Encounter (HOSPITAL_COMMUNITY): Payer: Medicare Other

## 2017-08-11 ENCOUNTER — Ambulatory Visit (HOSPITAL_COMMUNITY): Payer: Medicare Other

## 2017-08-11 ENCOUNTER — Encounter (HOSPITAL_COMMUNITY): Payer: Self-pay | Admitting: *Deleted

## 2017-08-11 DIAGNOSIS — K7460 Unspecified cirrhosis of liver: Secondary | ICD-10-CM | POA: Diagnosis not present

## 2017-08-11 DIAGNOSIS — Z980 Intestinal bypass and anastomosis status: Secondary | ICD-10-CM | POA: Diagnosis not present

## 2017-08-11 DIAGNOSIS — I8510 Secondary esophageal varices without bleeding: Secondary | ICD-10-CM | POA: Insufficient documentation

## 2017-08-11 DIAGNOSIS — K2970 Gastritis, unspecified, without bleeding: Secondary | ICD-10-CM | POA: Diagnosis not present

## 2017-08-11 DIAGNOSIS — Z79899 Other long term (current) drug therapy: Secondary | ICD-10-CM | POA: Diagnosis not present

## 2017-08-11 DIAGNOSIS — M109 Gout, unspecified: Secondary | ICD-10-CM | POA: Diagnosis not present

## 2017-08-11 DIAGNOSIS — N184 Chronic kidney disease, stage 4 (severe): Secondary | ICD-10-CM | POA: Insufficient documentation

## 2017-08-11 DIAGNOSIS — I129 Hypertensive chronic kidney disease with stage 1 through stage 4 chronic kidney disease, or unspecified chronic kidney disease: Secondary | ICD-10-CM | POA: Insufficient documentation

## 2017-08-11 DIAGNOSIS — I2510 Atherosclerotic heart disease of native coronary artery without angina pectoris: Secondary | ICD-10-CM | POA: Insufficient documentation

## 2017-08-11 DIAGNOSIS — E039 Hypothyroidism, unspecified: Secondary | ICD-10-CM | POA: Diagnosis not present

## 2017-08-11 DIAGNOSIS — Z8546 Personal history of malignant neoplasm of prostate: Secondary | ICD-10-CM | POA: Insufficient documentation

## 2017-08-11 DIAGNOSIS — Z7982 Long term (current) use of aspirin: Secondary | ICD-10-CM | POA: Diagnosis not present

## 2017-08-11 HISTORY — PX: ESOPHAGOGASTRODUODENOSCOPY (EGD) WITH PROPOFOL: SHX5813

## 2017-08-11 HISTORY — PX: ESOPHAGEAL BANDING: SHX5518

## 2017-08-11 HISTORY — DX: Other ascites: R18.8

## 2017-08-11 SURGERY — ESOPHAGOGASTRODUODENOSCOPY (EGD) WITH PROPOFOL
Anesthesia: Monitor Anesthesia Care

## 2017-08-11 MED ORDER — LACTATED RINGERS IV SOLN
INTRAVENOUS | Status: DC | PRN
  Administered 2017-08-11: 13:00:00 via INTRAVENOUS

## 2017-08-11 MED ORDER — SODIUM CHLORIDE 0.9 % IV SOLN: INTRAVENOUS | Status: DC

## 2017-08-11 MED ORDER — PROPOFOL 10 MG/ML IV BOLUS
INTRAVENOUS | Status: DC | PRN
Start: 2017-08-11 — End: 2017-08-11
  Administered 2017-08-11: 30 mg via INTRAVENOUS

## 2017-08-11 MED ORDER — SPIRONOLACTONE 25 MG PO TABS: 50.0000 mg | ORAL_TABLET | Freq: Every day | ORAL | 0 refills | Status: DC

## 2017-08-11 MED ORDER — PROPOFOL 500 MG/50ML IV EMUL
INTRAVENOUS | Status: DC | PRN
  Administered 2017-08-11: 75 ug/kg/min via INTRAVENOUS

## 2017-08-11 MED ORDER — SODIUM CHLORIDE 0.9 % IV SOLN: Freq: Once | INTRAVENOUS | Status: DC

## 2017-08-11 MED ORDER — MORPHINE SULFATE (PF) 4 MG/ML IV SOLN
2.0000 mg | Freq: Once | INTRAVENOUS | Status: AC
  Administered 2017-08-11: 2 mg via INTRAVENOUS

## 2017-08-11 MED ORDER — MORPHINE SULFATE (PF) 4 MG/ML IV SOLN
INTRAVENOUS | Status: AC
  Filled 2017-08-11: qty 1

## 2017-08-11 MED ORDER — PANTOPRAZOLE SODIUM 40 MG PO TBEC: 40.0000 mg | DELAYED_RELEASE_TABLET | Freq: Every day | ORAL | 1 refills | Status: DC

## 2017-08-11 SURGICAL SUPPLY — 14 items

## 2017-08-11 NOTE — Transfer of Care (Signed)
Immediate Anesthesia Transfer of Care Note  Patient: Ricky Harris  Procedure(s) Performed: ESOPHAGOGASTRODUODENOSCOPY (EGD) WITH PROPOFOL (N/A ) ESOPHAGEAL BANDING (N/A )  Patient Location: PACU and Endoscopy Unit  Anesthesia Type:MAC  Level of Consciousness: awake, alert  and oriented  Airway & Oxygen Therapy: Patient Spontanous Breathing and Patient connected to nasal cannula oxygen  Post-op Assessment: Report given to RN and Post -op Vital signs reviewed and stable  Post vital signs: Reviewed and stable  Last Vitals:  Vitals Value Taken Time  BP 174/83 08/11/2017  1:47 PM  Temp    Pulse 47 08/11/2017  1:47 PM  Resp 17 08/11/2017  1:47 PM  SpO2 100 % 08/11/2017  1:47 PM  Vitals shown include unvalidated device data.  Last Pain:  Vitals:   08/11/17 1347  TempSrc: Oral         Complications: No apparent anesthesia complications

## 2017-08-11 NOTE — Anesthesia Preprocedure Evaluation (Addendum)
Anesthesia Evaluation  Patient identified by MRN, date of birth, ID band Patient awake    Reviewed: Allergy & Precautions, NPO status , Patient's Chart, lab work & pertinent test results  History of Anesthesia Complications Negative for: history of anesthetic complications  Airway Mallampati: I  TM Distance: >3 FB Neck ROM: Full    Dental no notable dental hx.    Pulmonary neg pulmonary ROS,    breath sounds clear to auscultation       Cardiovascular hypertension, + CAD and + Peripheral Vascular Disease   Rhythm:Regular Rate:Normal     Neuro/Psych negative neurological ROS     GI/Hepatic (+) Cirrhosis       ,   Endo/Other  Hypothyroidism   Renal/GU Renal InsufficiencyRenal disease     Musculoskeletal  (+) Arthritis ,   Abdominal   Peds  Hematology  (+) anemia ,   Anesthesia Other Findings   Reproductive/Obstetrics                            Anesthesia Physical Anesthesia Plan  ASA: III  Anesthesia Plan: MAC   Post-op Pain Management:    Induction: Intravenous  PONV Risk Score and Plan: 2 and Treatment may vary due to age or medical condition  Airway Management Planned: Natural Airway and Nasal Cannula  Additional Equipment:   Intra-op Plan:   Post-operative Plan:   Informed Consent: I have reviewed the patients History and Physical, chart, labs and discussed the procedure including the risks, benefits and alternatives for the proposed anesthesia with the patient or authorized representative who has indicated his/her understanding and acceptance.   Dental advisory given  Plan Discussed with: CRNA  Anesthesia Plan Comments:         Anesthesia Quick Evaluation

## 2017-08-11 NOTE — Anesthesia Postprocedure Evaluation (Signed)
Anesthesia Post Note  Patient: Ricky Harris  Procedure(s) Performed: ESOPHAGOGASTRODUODENOSCOPY (EGD) WITH PROPOFOL (N/A ) ESOPHAGEAL BANDING (N/A )     Patient location during evaluation: Endoscopy Anesthesia Type: MAC Level of consciousness: awake and alert Pain management: pain level controlled Vital Signs Assessment: post-procedure vital signs reviewed and stable Respiratory status: spontaneous breathing, nonlabored ventilation, respiratory function stable and patient connected to nasal cannula oxygen Cardiovascular status: stable and blood pressure returned to baseline Postop Assessment: no apparent nausea or vomiting Anesthetic complications: no    Last Vitals:  Vitals:   08/11/17 1420 08/11/17 1430  BP: (!) 174/71 (!) 177/78  Pulse: (!) 47 (!) 49  Resp: 10 12  Temp:    SpO2: 99% 99%    Last Pain:  Vitals:   08/11/17 1420  TempSrc:   PainSc: 4                  Jazelyn Sipe,JAMES TERRILL

## 2017-08-11 NOTE — Discharge Instructions (Signed)
YOU HAD AN ENDOSCOPIC PROCEDURE TODAY: Refer to the procedure report and other information in the discharge instructions given to you for any specific questions about what was found during the examination. If this information does not answer your questions, please call [facility] office at [phone] to clarify.  ° °YOU SHOULD EXPECT: Some feelings of bloating in the abdomen. Passage of more gas than usual. Walking can help get rid of the air that was put into your GI tract during the procedure and reduce the bloating. ° °DIET: Your first meal following the procedure should be a light meal and then it is ok to progress to your normal diet. A half-sandwich or bowl of soup is an example of a good first meal. Heavy or fried foods are harder to digest and may make you feel nauseous or bloated. Drink plenty of fluids but you should avoid alcoholic beverages for 24 hours. If you had a esophageal dilation, please see attached instructions for diet.  ° °ACTIVITY: Your care partner should take you home directly after the procedure. You should plan to take it easy, moving slowly for the rest of the day. You can resume normal activity the day after the procedure however YOU SHOULD NOT DRIVE, use power tools, machinery or perform tasks that involve climbing or major physical exertion for 24 hours (because of the sedation medicines used during the test).  ° °SYMPTOMS TO REPORT IMMEDIATELY: °A gastroenterologist can be reached at any hour. Please call [phone]  for any of the following symptoms:  ° °Following upper endoscopy (EGD, EUS, ERCP, esophageal dilation) °Vomiting of blood or coffee ground material  °New, significant abdominal pain  °New, significant chest pain or pain under the shoulder blades  °Painful or persistently difficult swallowing  °New shortness of breath  °Black, tarry-looking or red, bloody stools ° °FOLLOW UP:  °If any biopsies were taken you will be contacted by phone or by letter within the next 1-3  weeks. Call [phone]  if you have not heard about the biopsies in 3 weeks.  °Please also call with any specific questions about appointments or follow up tests. ° °

## 2017-08-11 NOTE — H&P (Signed)
Primary Care Physician:  [name]., MD Primary Gastroenterologist:  Dr. Alessandra Bevels  Reason for Visit  : EGD for variceal band ligation  HPI: Ricky Harris is a 77 y.o. male past medical history of cirrhosis and chronic kidney disease here EGD for possible band ligation.he underwent EGD on the eighth 2019 which showed 3 channels of esophageal varices. He was also found to have gastritis but he had a significant bleeding with a single biopsy.band ligation was not performed. Blood work after procedure showed platelet counts of 62 with hemoglobin of around 9.3.  He denies any bleeding episodes. Denies black tarry stool. He is complaining of worsening abdominal distention and 4 pound weight gain. Denies diarrhea or constipation.  Past Medical History:  Diagnosis Date  . AAA (abdominal aortic aneurysm) ([place])   . Aortic stenosis   . Arthritis    knees  . Ascites   . Cancer [facility])    prostate  . Chronic kidney disease    stage 4  . 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([place])   . Coronary artery disease    on CT scan  . Gout   . H/O hypercholesterolemia   . Hypertension   . Hypothyroidism   . Liver cirrhosis ([place])   . Renal artery stenosis ([place])   . Thrombocytopenia ([place])   . Wears glasses     Past Surgical History:  Procedure Laterality Date  . CATARACT EXTRACTION W/ INTRAOCULAR LENS  IMPLANT, BILATERAL    . CHOLECYSTECTOMY    . ESOPHAGOGASTRODUODENOSCOPY (EGD) WITH PROPOFOL N/A 07/05/2017   Procedure: ESOPHAGOGASTRODUODENOSCOPY (EGD) WITH PROPOFOL;  Surgeon: Otis Brace, MD;  Location: [place];  Service: Gastroenterology;  Laterality: N/A;  . HERNIA REPAIR    . PROSTATECTOMY    . subtotal gastrectomy      Prior to Admission medications   Medication Sig Start Date End Date Taking? Authorizing Provider  aspirin EC 81 MG tablet Take 81 mg by mouth daily.   Yes [provider]  atorvastatin (LIPITOR) 10 MG tablet Take 10 mg by mouth daily at 6 PM. 05/06/17   Yes [provider]  carvedilol (COREG) 12.5 MG tablet Take 12.5 mg by mouth 2 (two) times daily. 03/22/17  Yes [provider]  cloNIDine (CATAPRES) 0.1 MG tablet Take 0.1 mg by mouth 2 (two) times daily.   Yes [provider]  colchicine 0.6 MG tablet Take 0.6 mg by mouth daily. 03/22/17  Yes [provider]  febuxostat (ULORIC) 40 MG tablet Take 40 mg by mouth daily.   Yes [provider]  furosemide (LASIX) 20 MG tablet Take 1 tablet (20 mg total) by mouth daily. 05/22/17  Yes Debbe Odea, MD  levothyroxine (SYNTHROID, LEVOTHROID) 25 MCG tablet Take 25 mcg by mouth daily before breakfast.  04/05/17  Yes [provider]  Multiple Vitamins-Minerals (MULTIVITAMIN PO) Take 1 tablet by mouth daily.   Yes [provider]  ondansetron (ZOFRAN) 4 MG tablet Take 4 mg by mouth every 8 (eight) hours as needed for nausea or vomiting.  05/10/17  Yes [provider]  spironolactone (ALDACTONE) 25 MG tablet Take 1 tablet (25 mg total) by mouth daily. 05/22/17  Yes Debbe Odea, MD  traMADol (ULTRAM) 50 MG tablet Take 50 mg by mouth daily as needed for moderate pain.   Yes [provider]  omeprazole (PRILOSEC) 20 MG capsule Take 1 capsule (20 mg total) by mouth daily. Patient not taking: Reported on 08/04/2017 07/05/17 07/05/18  Jamoni Broadfoot,  Makiah Clauson, MD    Scheduled Meds: Continuous Infusions: . sodium chloride    . sodium chloride     PRN Meds:.  Allergies as of 07/18/2017 - Review Complete 07/05/2017  Allergen Reaction Noted  . Cephalexin Hives 04/01/2015  . Hydralazine Hives 05/20/2017    Family History  Problem Relation Age of Onset  . Diabetes Mellitus II Mother   . Stroke Mother     Social History   Socioeconomic History  . Marital status: Divorced    Spouse name: Not on file  . Number of children: Not on file  . Years of education: Not on file  . Highest education level: Not on file  Occupational History  . Not  on file  Social Needs  . Financial resource strain: Not on file  . Food insecurity:    Worry: Not on file    Inability: Not on file  . Transportation needs:    Medical: Not on file    Non-medical: Not on file  Tobacco Use  . Smoking status: Never Smoker  . Smokeless tobacco: Never Used  Substance and Sexual Activity  . Alcohol use: Not Currently  . Drug use: Never  . Sexual activity: Not on file  Lifestyle  . Physical activity:    Days per week: Not on file    Minutes per session: Not on file  . Stress: Not on file  Relationships  . Social connections:    Talks on phone: Not on file    Gets together: Not on file    Attends religious service: Not on file    Active member of club or organization: Not on file    Attends meetings of clubs or organizations: Not on file    Relationship status: Not on file  . Intimate partner violence:    Fear of current or ex partner: Not on file    Emotionally abused: Not on file    Physically abused: Not on file    Forced sexual activity: Not on file  Other Topics Concern  . Not on file  Social History Narrative  . Not on file      Physical Exam: Vital signs: There were no vitals filed for this visit.   General:   Alert,  Well-developed, well-nourished, pleasant and cooperative in NAD Lungs:  Clear throughout to auscultation.   No wheezes, crackles, or rhonchi. No acute distress. Heart:  Regular rate and rhythm; no murmurs, clicks, rubs,  or gallops. Abdomen: mild distended, nontender, bowel sounds present. No peritoneal signs Rectal:  Deferred  GI:  Lab Results: No results for input(s): WBC, HGB, HCT, PLT in the last 72 hours. BMET No results for input(s): NA, K, CL, CO2, GLUCOSE, BUN, CREATININE, CALCIUM in the last 72 hours. LFT No results for input(s): PROT, ALBUMIN, AST, ALT, ALKPHOS, BILITOT, BILIDIR, IBILI in the last 72 hours. PT/INR No results for input(s): LABPROT, INR in the last 72 hours.   Studies/Results: No  results found.  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 - Thrombocytopenia. S?P PLT  transfusion earlier this morning. - chronic kidney disease. - bradycardia - History of subtotal gastrectomy for bleeding ulcer 30 years ago    Recommendations -------------------------- - EGD today for band ligation. -  Increase dose of  spironolactone to 50 mg, he has no significant lower extremity edema. Continue Lasix 20 mg. - recommend rechecking  BMP in 2 weeks. -  not a candidate for beta blocker because of baseline bradycardia.  Risks (  bleeding, infection, bowel perforation that could require surgery, sedation-related changes in cardiopulmonary systems), benefits (identification and possible treatment of source of symptoms, exclusion of certain causes of symptoms), and alternatives (watchful waiting, radiographic imaging studies, empiric medical treatment)  were explained to patientin detail and patient wishes to proceed.    LOS: 0 days   Otis Brace  MD, FACP 08/11/2017, 12:50 PM  Contact #  [phone]

## 2017-08-11 NOTE — Op Note (Signed)
[facility] Patient Name: Ricky Harris Procedure Date : 08/11/2017 MRN: [id number] Attending MD: Otis Brace , MD Date of Birth: 1940/05/10 CSN: [id number] Age: 77 Admit Type: Inpatient Procedure:                Upper GI endoscopy Indications:              For therapy of esophageal varices Providers:                Otis Brace, MD, Angus Seller, Waynette Buttery., Technician Referring MD:              Medicines:                Sedation Administered by an Anesthesia Professional Complications:            No immediate complications. Estimated Blood Loss:     Estimated blood loss was minimal. Procedure:                Pre-Anesthesia Assessment:                           - Prior to the procedure, a History and Physical                            was performed, and patient medications and                            allergies were reviewed. The patient's tolerance of                            previous anesthesia was also reviewed. The risks                            and benefits of the procedure and the sedation                            options and risks were discussed with the patient.                            All questions were answered, and informed consent                            was obtained. Prior Anticoagulants: The patient has                            taken no previous anticoagulant or antiplatelet                            agents. ASA Grade Assessment: III - A patient with                            severe systemic disease. After reviewing the risks  and benefits, the patient was deemed in                            satisfactory condition to undergo the procedure.                           After obtaining informed consent, the endoscope was                            passed under direct vision. Throughout the                            procedure, the patient's blood pressure, pulse, and                             oxygen saturations were monitored continuously. The                            Endoscope was introduced through the mouth, and                            advanced to the second part of duodenum. The upper                            GI endoscopy was accomplished without difficulty.                            The patient tolerated the procedure well. Scope In: Scope Out: Findings:      Diffuse moderate inflammation characterized by congestion (edema),       erythema and friability was found in the entire examined stomach.      The cardia and gastric fundus were normal on retroflexion.      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 A gastric pouch       was found. This was traversed.      The first portion of the duodenum and second portion of the duodenum       were normal.      Three columns of non-bleeding large (> 5 mm) varices were found in the       distal esophagus,. No stigmata of recent bleeding were evident. Four       bands were successfully placed with complete eradication, resulting in       deflation of varices. There was no bleeding during, and at the end, of       the procedure. Impression:               - Gastritis.                           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                           - Normal first portion of the duodenum and second                            portion of the duodenum.                           -  Non-bleeding large (> 5 mm) esophageal varices.                            Completely eradicated. Banded.                           - No specimens collected. Recommendation:           - Patient has a contact number available for                            emergencies. The signs and symptoms of potential                            delayed complications were discussed with the                            patient. Return to normal activities tomorrow.                            Written discharge  instructions were provided to the                            patient.                           - Resume previous diet.                           - Continue present medications.                           - Repeat upper endoscopy in 6 weeks for retreatment.                           - Return to my office in 4 weeks.                           - Increase Spironolactone to 50 mg and repeat BMP                            In 2 weeks. Procedure Code(s):        --- Professional ---                           [phone], Esophagogastroduodenoscopy, flexible,                            transoral; with band ligation of esophageal/gastric                            varices Diagnosis Code(s):        --- Professional ---                           K29.70, Gastritis, unspecified, without bleeding  Z98.0, Intestinal bypass and anastomosis status                           I85.00, Esophageal varices without bleeding CPT copyright 2017 [employer]. All rights reserved. The codes documented in this report are preliminary and upon coder review may  be revised to meet current compliance requirements. Otis Brace, MD Otis Brace, MD 08/11/2017 1:57:33 PM Number of Addenda: 0

## 2017-08-11 NOTE — Progress Notes (Signed)
Pt continues to have pain from variceal banding and increase SBP.  Discussed with Dr. Alessandra Bevels,  Discharge patient home in 30 min.  Pt can take Tylenol 2 g per day and one Aleve per day for pain.  If pain does not improve by early evening instructed to contact Dr. Leanna Sato office.  Pt verb und.

## 2017-08-12 LAB — PREPARE PLATELET PHERESIS
UNIT DIVISION: 0
Unit division: 0

## 2017-08-12 LAB — BPAM PLATELET PHERESIS
Blood Product Expiration Date: 201906142359
Blood Product Expiration Date: 201906150816
ISSUE DATE / TIME: 201906140828
ISSUE DATE / TIME: 201906140828
UNIT TYPE AND RH: 8400
Unit Type and Rh: 6200

## 2017-08-14 ENCOUNTER — Encounter (HOSPITAL_COMMUNITY): Payer: Self-pay | Admitting: Gastroenterology

## 2017-09-13 ENCOUNTER — Other Ambulatory Visit: Payer: Self-pay | Admitting: Gastroenterology

## 2017-10-26 ENCOUNTER — Ambulatory Visit (HOSPITAL_COMMUNITY): Payer: Medicare Other | Admitting: Certified Registered Nurse Anesthetist

## 2017-10-26 ENCOUNTER — Encounter (HOSPITAL_COMMUNITY)
Admission: RE | Disposition: A | Discharge status: Home / Self Care | Payer: Self-pay | Source: Ambulatory Visit | Attending: Gastroenterology

## 2017-10-26 ENCOUNTER — Ambulatory Visit (HOSPITAL_COMMUNITY)
Admission: RE | Admit: 2017-10-26 | Discharge: 2017-10-26 | Disposition: A | Discharge status: Home / Self Care | Payer: Medicare Other | Source: Ambulatory Visit | Attending: Gastroenterology | Admitting: Gastroenterology

## 2017-10-26 ENCOUNTER — Encounter (HOSPITAL_COMMUNITY): Payer: Self-pay | Admitting: Emergency Medicine

## 2017-10-26 ENCOUNTER — Other Ambulatory Visit: Payer: Self-pay

## 2017-10-26 DIAGNOSIS — I2510 Atherosclerotic heart disease of native coronary artery without angina pectoris: Secondary | ICD-10-CM | POA: Insufficient documentation

## 2017-10-26 DIAGNOSIS — K2970 Gastritis, unspecified, without bleeding: Secondary | ICD-10-CM | POA: Insufficient documentation

## 2017-10-26 DIAGNOSIS — Z7982 Long term (current) use of aspirin: Secondary | ICD-10-CM | POA: Insufficient documentation

## 2017-10-26 DIAGNOSIS — I701 Atherosclerosis of renal artery: Secondary | ICD-10-CM | POA: Insufficient documentation

## 2017-10-26 DIAGNOSIS — I8510 Secondary esophageal varices without bleeding: Secondary | ICD-10-CM | POA: Insufficient documentation

## 2017-10-26 DIAGNOSIS — M170 Bilateral primary osteoarthritis of knee: Secondary | ICD-10-CM | POA: Diagnosis not present

## 2017-10-26 DIAGNOSIS — Z8546 Personal history of malignant neoplasm of prostate: Secondary | ICD-10-CM | POA: Diagnosis not present

## 2017-10-26 DIAGNOSIS — E039 Hypothyroidism, unspecified: Secondary | ICD-10-CM | POA: Diagnosis not present

## 2017-10-26 DIAGNOSIS — Z79899 Other long term (current) drug therapy: Secondary | ICD-10-CM | POA: Diagnosis not present

## 2017-10-26 DIAGNOSIS — I714 Abdominal aortic aneurysm, without rupture: Secondary | ICD-10-CM | POA: Insufficient documentation

## 2017-10-26 DIAGNOSIS — Z888 Allergy status to other drugs, medicaments and biological substances status: Secondary | ICD-10-CM | POA: Insufficient documentation

## 2017-10-26 DIAGNOSIS — Z980 Intestinal bypass and anastomosis status: Secondary | ICD-10-CM | POA: Insufficient documentation

## 2017-10-26 DIAGNOSIS — K7460 Unspecified cirrhosis of liver: Secondary | ICD-10-CM | POA: Diagnosis not present

## 2017-10-26 DIAGNOSIS — I739 Peripheral vascular disease, unspecified: Secondary | ICD-10-CM | POA: Insufficient documentation

## 2017-10-26 DIAGNOSIS — R001 Bradycardia, unspecified: Secondary | ICD-10-CM | POA: Diagnosis not present

## 2017-10-26 DIAGNOSIS — D696 Thrombocytopenia, unspecified: Secondary | ICD-10-CM | POA: Diagnosis not present

## 2017-10-26 DIAGNOSIS — N184 Chronic kidney disease, stage 4 (severe): Secondary | ICD-10-CM | POA: Diagnosis not present

## 2017-10-26 DIAGNOSIS — I129 Hypertensive chronic kidney disease with stage 1 through stage 4 chronic kidney disease, or unspecified chronic kidney disease: Secondary | ICD-10-CM | POA: Insufficient documentation

## 2017-10-26 HISTORY — PX: ESOPHAGEAL BANDING: SHX5518

## 2017-10-26 HISTORY — PX: ESOPHAGOGASTRODUODENOSCOPY (EGD) WITH PROPOFOL: SHX5813

## 2017-10-26 SURGERY — ESOPHAGOGASTRODUODENOSCOPY (EGD) WITH PROPOFOL
Anesthesia: Monitor Anesthesia Care

## 2017-10-26 MED ORDER — PROPOFOL 10 MG/ML IV BOLUS
INTRAVENOUS | Status: DC | PRN
  Administered 2017-10-26 (×3): 10 mg via INTRAVENOUS
  Administered 2017-10-26 (×2): 20 mg via INTRAVENOUS

## 2017-10-26 MED ORDER — ONDANSETRON HCL 4 MG/2ML IJ SOLN
INTRAMUSCULAR | Status: DC | PRN
  Administered 2017-10-26: 4 mg via INTRAVENOUS

## 2017-10-26 MED ORDER — LACTATED RINGERS IV SOLN
INTRAVENOUS | Status: DC | PRN
  Administered 2017-10-26: 10:00:00 via INTRAVENOUS

## 2017-10-26 MED ORDER — LIDOCAINE 2% (20 MG/ML) 5 ML SYRINGE
INTRAMUSCULAR | Status: DC | PRN
  Administered 2017-10-26: 100 mg via INTRAVENOUS

## 2017-10-26 MED ORDER — PROPOFOL 500 MG/50ML IV EMUL
INTRAVENOUS | Status: DC | PRN
  Administered 2017-10-26: 75 ug/kg/min via INTRAVENOUS

## 2017-10-26 MED ORDER — PROPOFOL 10 MG/ML IV BOLUS
INTRAVENOUS | Status: AC
  Filled 2017-10-26: qty 60

## 2017-10-26 SURGICAL SUPPLY — 14 items

## 2017-10-26 NOTE — Op Note (Signed)
[facility] Patient Name: Ricky Harris Procedure Date: 10/26/2017 MRN: [id number] Attending MD: Otis Brace , MD Date of Birth: 24-Jul-1940 CSN: [id number] Age: 77 Admit Type: Inpatient Procedure:                Upper GI endoscopy Indications:              Esophageal varices, For therapy of esophageal                            varices Providers:                Otis Brace, MD, Vista Lawman, RN, Alan Mulder, Technician, Cathe Mons, CRNA Referring MD:              Medicines:                Sedation Administered by an Anesthesia Professional Complications:            No immediate complications. Estimated Blood Loss:     Estimated blood loss was minimal. Procedure:                Pre-Anesthesia Assessment:                           - Prior to the procedure, a History and Physical                            was performed, and patient medications and                            allergies were reviewed. The patient's tolerance of                            previous anesthesia was also reviewed. The risks                            and benefits of the procedure and the sedation                            options and risks were discussed with the patient.                            All questions were answered, and informed consent                            was obtained. Prior Anticoagulants: The patient has                            taken no previous anticoagulant or antiplatelet                            agents. ASA Grade Assessment: III - A patient with  severe systemic disease. After reviewing the risks                            and benefits, the patient was deemed in                            satisfactory condition to undergo the procedure.                           After obtaining informed consent, the endoscope was                            passed under direct vision. Throughout the                             procedure, the patient's blood pressure, pulse, and                            oxygen saturations were monitored continuously. The                            [id number]-H190 ([id number]) Olympus adult endoscope was                            introduced through the mouth, and advanced to the                            second part of duodenum. The upper GI endoscopy was                            accomplished without difficulty. The patient                            tolerated the procedure well. Scope In: Scope Out: Findings:      Diffuse moderate inflammation characterized by congestion (edema),       erythema and friability was found in the entire examined stomach.      A small amount of food (residue) was found in the gastric body.      The cardia and gastric fundus were normal on retroflexion.      Evidence of a patent Billroth I gastroduodenostomy was found. A gastric       pouch with a large size was found. This was traversed.      Three columns of non-bleeding large (> 5 mm) varices were found in the       distal esophagus,. No stigmata of recent bleeding were evident and no       red wale signs were present. Scarring from prior treatment was visible.       Five bands were successfully placed (last column had to bands deployed       accidentally) with incomplete eradication of varices. There was no       bleeding at the end of the maneuver. Impression:               - Gastritis.                           -  A small amount of food (residue) in the stomach.                           - Patent Billroth I gastroduodenostomy was found.                           - Non-bleeding large (> 5 mm) esophageal varices.                            Incompletely eradicated. Banded.                           - No specimens collected. Moderate Sedation:      Moderate (conscious) sedation was personally administered by an       anesthesia professional. The following parameters were monitored: oxygen        saturation, heart rate, blood pressure, and response to care. Recommendation:           - Patient has a contact number available for                            emergencies. The signs and symptoms of potential                            delayed complications were discussed with the                            patient. Return to normal activities tomorrow.                            Written discharge instructions were provided to the                            patient.                           - Resume previous diet.                           - Continue present medications.                           - Repeat upper endoscopy at appointment to be                            scheduled for retreatment.                           - Return to my office as previously scheduled. Procedure Code(s):        --- Professional ---                           [phone], Esophagogastroduodenoscopy, flexible,                            transoral; with band ligation of esophageal/gastric  varices Diagnosis Code(s):        --- Professional ---                           K29.70, Gastritis, unspecified, without bleeding                           Z98.0, Intestinal bypass and anastomosis status                           I85.00, Esophageal varices without bleeding CPT copyright 2017 American [employer] Association. All rights reserved. The codes documented in this report are preliminary and upon coder review may  be revised to meet current compliance requirements. Otis Brace, MD Otis Brace, MD 10/26/2017 10:40:55 AM Number of Addenda: 0

## 2017-10-26 NOTE — H&P (Signed)
Primary Care Physician:  [name]., MD Primary Gastroenterologist:  Dr. Alessandra Bevels  Reason for Visit  : EGD for variceal band ligation  HPI: [facility] is a 77 y.o. male past medical history of cirrhosis and chronic kidney disease for EGD for possible band ligation. Underwent EGD on 08/11/2017 which showed large esophageal varices, 4 bands were placed.it also showed diffuse gastritis, biopsies were negative for H. Pylori. Blood work on 09/13/2017 showed a platelet count of 74, INR 1.3  He denies any GI symptoms today.  Past Medical History:  Diagnosis Date  . AAA (abdominal aortic aneurysm) ([place])   . Aortic stenosis   . Arthritis    knees  . Ascites   . Cancer [facility])    prostate  . Chronic kidney disease    stage 4  . 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([place])   . Coronary artery disease    on CT scan  . Gout   . H/O hypercholesterolemia   . Hypertension   . Hypothyroidism   . Liver cirrhosis ([place])   . Renal artery stenosis ([place])   . Thrombocytopenia ([place])   . Wears glasses     Past Surgical History:  Procedure Laterality Date  . CATARACT EXTRACTION W/ INTRAOCULAR LENS  IMPLANT, BILATERAL    . CHOLECYSTECTOMY    . ESOPHAGEAL BANDING N/A 08/11/2017   Procedure: ESOPHAGEAL BANDING;  Surgeon: Otis Brace, MD;  Location: [place];  Service: Gastroenterology;  Laterality: N/A;  . ESOPHAGOGASTRODUODENOSCOPY (EGD) WITH PROPOFOL N/A 07/05/2017   Procedure: ESOPHAGOGASTRODUODENOSCOPY (EGD) WITH PROPOFOL;  Surgeon: Otis Brace, MD;  Location: [facility];  Service: Gastroenterology;  Laterality: N/A;  . ESOPHAGOGASTRODUODENOSCOPY (EGD) WITH PROPOFOL N/A 08/11/2017   Procedure: ESOPHAGOGASTRODUODENOSCOPY (EGD) WITH PROPOFOL;  Surgeon: Otis Brace, MD;  Location: [facility];  Service: Gastroenterology;  Laterality: N/A;  . HERNIA REPAIR    . PROSTATECTOMY    . subtotal gastrectomy      Prior to Admission medications   Medication Sig Start Date End  Date Taking? Authorizing Provider  aspirin EC 81 MG tablet Take 81 mg by mouth daily.   Yes [provider]  atorvastatin (LIPITOR) 10 MG tablet Take 10 mg by mouth daily at 6 PM. 05/06/17  Yes [provider]  carvedilol (COREG) 12.5 MG tablet Take 12.5 mg by mouth 2 (two) times daily. 03/22/17  Yes [provider]  cloNIDine (CATAPRES) 0.1 MG tablet Take 0.1 mg by mouth 2 (two) times daily.   Yes [provider]  colchicine 0.6 MG tablet Take 0.6 mg by mouth daily. 03/22/17  Yes [provider]  febuxostat (ULORIC) 40 MG tablet Take 40 mg by mouth daily.   Yes [provider]  furosemide (LASIX) 20 MG tablet Take 1 tablet (20 mg total) by mouth daily. 05/22/17  Yes Debbe Odea, MD  levothyroxine (SYNTHROID, LEVOTHROID) 25 MCG tablet Take 25 mcg by mouth daily before breakfast.  04/05/17  Yes [provider]  Multiple Vitamins-Minerals (MULTIVITAMIN PO) Take 1 tablet by mouth daily.   Yes [provider]  ondansetron (ZOFRAN) 4 MG tablet Take 4 mg by mouth every 8 (eight) hours as needed for nausea or vomiting.  05/10/17  Yes [provider]  spironolactone (ALDACTONE) 25 MG tablet Take 1 tablet (25 mg total) by mouth daily. 05/22/17  Yes Debbe Odea, MD  traMADol (ULTRAM) 50 MG tablet Take 50 mg by mouth daily as needed for moderate pain.   Yes [provider]  omeprazole (PRILOSEC) 20 MG capsule Take 1 capsule (20 mg total) by mouth daily. Patient not taking: Reported on 08/04/2017 07/05/17 07/05/18  Otis Brace, MD    Scheduled Meds: Continuous Infusions:  PRN Meds:.  Allergies as of 09/13/2017 - Review Complete 08/11/2017  Allergen Reaction Noted  . Cephalexin Hives 04/01/2015  . Hydralazine Hives 05/20/2017    Family History  Problem Relation Age of Onset  . Diabetes Mellitus II Mother   . Stroke Mother     Social History   Socioeconomic History  . Marital status: Divorced    Spouse name:  Not on file  . Number of children: Not on file  . Years of education: Not on file  . Highest education level: Not on file  Occupational History  . Not on file  Social Needs  . Financial resource strain: Not on file  . Food insecurity:    Worry: Not on file    Inability: Not on file  . Transportation needs:    Medical: Not on file    Non-medical: Not on file  Tobacco Use  . Smoking status: Never Smoker  . Smokeless tobacco: Never Used  Substance and Sexual Activity  . Alcohol use: Not Currently  . Drug use: Never  . Sexual activity: Not on file  Lifestyle  . Physical activity:    Days per week: Not on file    Minutes per session: Not on file  . Stress: Not on file  Relationships  . Social connections:    Talks on phone: Not on file    Gets together: Not on file    Attends religious service: Not on file    Active member of club or organization: Not on file    Attends meetings of clubs or organizations: Not on file    Relationship status: Not on file  . Intimate partner violence:    Fear of current or ex partner: Not on file    Emotionally abused: Not on file    Physically abused: Not on file    Forced sexual activity: Not on file  Other Topics Concern  . Not on file  Social History Narrative  . Not on file      Physical Exam: Vital signs: There were no vitals filed for this visit.   General:   Alert,  Well-developed, well-nourished, pleasant and cooperative in NAD Lungs:  Clear throughout to auscultation.   No wheezes, crackles, or rhonchi. No acute distress. Heart:  Regular rate and rhythm; no murmurs, clicks, rubs,  or gallops. Abdomen: mild distended, nontender, bowel sounds present. No peritoneal signs Rectal:  Deferred  GI:  Lab Results: No results for input(s): WBC, HGB, HCT, PLT in the last 72 hours. BMET No results for input(s): NA, K, CL, CO2, GLUCOSE, BUN, CREATININE, CALCIUM in the last 72 hours. LFT No results for input(s): PROT, ALBUMIN, AST,  ALT, ALKPHOS, BILITOT, BILIDIR, IBILI in the last 72 hours. PT/INR No results for input(s): LABPROT, INR in the last 72 hours.   Studies/Results: No results found.  Impression/Plan: - history of esophageal varices status post band ligation in June 2019.  decompensated cirrhosis with esophageal varices and ascites.  - Thrombocytopenia. S?P PLT  transfusion earlier this morning. - chronic kidney disease. - bradycardia - History of subtotal gastrectomy for bleeding ulcer 30 years ago    Recommendations -------------------------- - EGD today for possible band ligation.  -  not a candidate for beta blocker because of baseline bradycardia.  Risks (bleeding, infection,  bowel perforation that could require surgery, sedation-related changes in cardiopulmonary systems), benefits (identification and possible treatment of source of symptoms, exclusion of certain causes of symptoms), and alternatives (watchful waiting, radiographic imaging studies, empiric medical treatment)  were explained to patientin detail and patient wishes to proceed.    LOS: 0 days   Otis Brace  MD, FACP 10/26/2017, 9:11 AM  Contact #  [phone]

## 2017-10-26 NOTE — Anesthesia Preprocedure Evaluation (Signed)
Anesthesia Evaluation  Patient identified by MRN, date of birth, ID band Patient awake    Reviewed: Allergy & Precautions, NPO status , Patient's Chart, lab work & pertinent test results, reviewed documented beta blocker date and time   History of Anesthesia Complications Negative for: history of anesthetic complications  Airway Mallampati: I  TM Distance: >3 FB Neck ROM: Full    Dental no notable dental hx.    Pulmonary neg pulmonary ROS,    breath sounds clear to auscultation       Cardiovascular hypertension, Pt. on medications and Pt. on home beta blockers + CAD and + Peripheral Vascular Disease   Rhythm:Regular Rate:Normal     Neuro/Psych negative neurological ROS     GI/Hepatic (+) Cirrhosis       , Esophageal Varices   Endo/Other  Hypothyroidism   Renal/GU Renal InsufficiencyRenal disease     Musculoskeletal  (+) Arthritis ,   Abdominal   Peds  Hematology  (+) anemia ,   Anesthesia Other Findings   Reproductive/Obstetrics                             Anesthesia Physical Anesthesia Plan  ASA: III  Anesthesia Plan: MAC   Post-op Pain Management:    Induction: Intravenous  PONV Risk Score and Plan: 1 and Treatment may vary due to age or medical condition  Airway Management Planned: Nasal Cannula  Additional Equipment: None  Intra-op Plan:   Post-operative Plan:   Informed Consent: I have reviewed the patients History and Physical, chart, labs and discussed the procedure including the risks, benefits and alternatives for the proposed anesthesia with the patient or authorized representative who has indicated his/her understanding and acceptance.   Dental advisory given  Plan Discussed with: CRNA  Anesthesia Plan Comments:        Anesthesia Quick Evaluation

## 2017-10-26 NOTE — Transfer of Care (Signed)
Immediate Anesthesia Transfer of Care Note  Patient: Ricky [facility]  Procedure(s) Performed: ESOPHAGOGASTRODUODENOSCOPY (EGD) WITH PROPOFOL (N/A ) ESOPHAGEAL BANDING (N/A )  Patient Location: Endoscopy Unit  Anesthesia Type:MAC  Level of Consciousness: drowsy and patient cooperative  Airway & Oxygen Therapy: Patient Spontanous Breathing and Patient connected to nasal cannula oxygen  Post-op Assessment: Report given to RN and Post -op Vital signs reviewed and stable  Post vital signs: Reviewed and stable  Last Vitals:  Vitals Value Taken Time  BP    Temp    Pulse 55 10/26/2017 10:40 AM  Resp 16 10/26/2017 10:40 AM  SpO2 100 % 10/26/2017 10:40 AM  Vitals shown include unvalidated device data.  Last Pain:  Vitals:   10/26/17 0916  TempSrc: Oral         Complications: No apparent anesthesia complications

## 2017-10-26 NOTE — Discharge Instructions (Signed)

## 2017-10-27 ENCOUNTER — Encounter (HOSPITAL_COMMUNITY): Payer: Self-pay | Admitting: Gastroenterology

## 2017-10-28 NOTE — Anesthesia Postprocedure Evaluation (Signed)
Anesthesia Post Note  Patient: KENDRELL LOTTMAN  Procedure(s) Performed: ESOPHAGOGASTRODUODENOSCOPY (EGD) WITH PROPOFOL (N/A ) ESOPHAGEAL BANDING (N/A )     Patient location during evaluation: Endoscopy Anesthesia Type: MAC Level of consciousness: awake and alert Pain management: pain level controlled Vital Signs Assessment: post-procedure vital signs reviewed and stable Respiratory status: spontaneous breathing, nonlabored ventilation, respiratory function stable and patient connected to nasal cannula oxygen Cardiovascular status: stable and blood pressure returned to baseline Postop Assessment: no apparent nausea or vomiting Anesthetic complications: no    Last Vitals:  Vitals:   10/26/17 1050 10/26/17 1055  BP: (!) 166/72 (!) 164/77  Pulse: (!) 48 (!) 48  Resp: 14 14  Temp:    SpO2: 100% 100%    Last Pain:  Vitals:   10/26/17 1055  TempSrc:   PainSc: 0-No pain                 Angeletta Goelz

## 2017-11-04 ENCOUNTER — Other Ambulatory Visit: Payer: Self-pay | Admitting: Gastroenterology

## 2017-11-04 DIAGNOSIS — K7460 Unspecified cirrhosis of liver: Secondary | ICD-10-CM

## 2017-11-10 ENCOUNTER — Ambulatory Visit
Admission: RE | Admit: 2017-11-10 | Discharge: 2017-11-10 | Disposition: A | Discharge status: Home / Self Care | Payer: Medicare Other | Source: Ambulatory Visit | Attending: Gastroenterology | Admitting: Gastroenterology

## 2017-11-10 DIAGNOSIS — K7460 Unspecified cirrhosis of liver: Secondary | ICD-10-CM

## 2017-11-29 ENCOUNTER — Other Ambulatory Visit: Payer: Self-pay | Admitting: Gastroenterology

## 2017-12-19 ENCOUNTER — Other Ambulatory Visit: Payer: Self-pay

## 2017-12-19 ENCOUNTER — Encounter (HOSPITAL_COMMUNITY)
Admission: RE | Disposition: A | Discharge status: Home / Self Care | Payer: Self-pay | Source: Ambulatory Visit | Attending: Gastroenterology

## 2017-12-19 ENCOUNTER — Ambulatory Visit (HOSPITAL_COMMUNITY): Payer: Medicare Other | Admitting: Anesthesiology

## 2017-12-19 ENCOUNTER — Ambulatory Visit (HOSPITAL_COMMUNITY)
Admission: RE | Admit: 2017-12-19 | Discharge: 2017-12-19 | Disposition: A | Discharge status: Home / Self Care | Payer: Medicare Other | Source: Ambulatory Visit | Attending: Gastroenterology | Admitting: Gastroenterology

## 2017-12-19 DIAGNOSIS — Z980 Intestinal bypass and anastomosis status: Secondary | ICD-10-CM | POA: Diagnosis not present

## 2017-12-19 DIAGNOSIS — E039 Hypothyroidism, unspecified: Secondary | ICD-10-CM | POA: Diagnosis not present

## 2017-12-19 DIAGNOSIS — I2510 Atherosclerotic heart disease of native coronary artery without angina pectoris: Secondary | ICD-10-CM | POA: Insufficient documentation

## 2017-12-19 DIAGNOSIS — K7460 Unspecified cirrhosis of liver: Secondary | ICD-10-CM | POA: Insufficient documentation

## 2017-12-19 DIAGNOSIS — Z7982 Long term (current) use of aspirin: Secondary | ICD-10-CM | POA: Diagnosis not present

## 2017-12-19 DIAGNOSIS — Z903 Acquired absence of stomach [part of]: Secondary | ICD-10-CM | POA: Diagnosis not present

## 2017-12-19 DIAGNOSIS — M109 Gout, unspecified: Secondary | ICD-10-CM | POA: Insufficient documentation

## 2017-12-19 DIAGNOSIS — E7800 Pure hypercholesterolemia, unspecified: Secondary | ICD-10-CM | POA: Insufficient documentation

## 2017-12-19 DIAGNOSIS — I8510 Secondary esophageal varices without bleeding: Secondary | ICD-10-CM | POA: Diagnosis present

## 2017-12-19 DIAGNOSIS — Z79899 Other long term (current) drug therapy: Secondary | ICD-10-CM | POA: Diagnosis not present

## 2017-12-19 DIAGNOSIS — D696 Thrombocytopenia, unspecified: Secondary | ICD-10-CM | POA: Diagnosis not present

## 2017-12-19 DIAGNOSIS — K2950 Unspecified chronic gastritis without bleeding: Secondary | ICD-10-CM | POA: Diagnosis not present

## 2017-12-19 DIAGNOSIS — I129 Hypertensive chronic kidney disease with stage 1 through stage 4 chronic kidney disease, or unspecified chronic kidney disease: Secondary | ICD-10-CM | POA: Diagnosis not present

## 2017-12-19 DIAGNOSIS — R188 Other ascites: Secondary | ICD-10-CM | POA: Diagnosis not present

## 2017-12-19 DIAGNOSIS — N184 Chronic kidney disease, stage 4 (severe): Secondary | ICD-10-CM | POA: Diagnosis not present

## 2017-12-19 DIAGNOSIS — I350 Nonrheumatic aortic (valve) stenosis: Secondary | ICD-10-CM | POA: Diagnosis not present

## 2017-12-19 HISTORY — PX: ESOPHAGEAL BANDING: SHX5518

## 2017-12-19 HISTORY — PX: ESOPHAGOGASTRODUODENOSCOPY (EGD) WITH PROPOFOL: SHX5813

## 2017-12-19 SURGERY — ESOPHAGOGASTRODUODENOSCOPY (EGD) WITH PROPOFOL
Anesthesia: Monitor Anesthesia Care

## 2017-12-19 MED ORDER — SODIUM CHLORIDE 0.9 % IV SOLN: INTRAVENOUS | Status: DC

## 2017-12-19 MED ORDER — PANTOPRAZOLE SODIUM 40 MG PO TBEC: 40.0000 mg | DELAYED_RELEASE_TABLET | Freq: Every day | ORAL | 3 refills | Status: DC

## 2017-12-19 MED ORDER — PROPOFOL 10 MG/ML IV BOLUS
INTRAVENOUS | Status: DC | PRN
  Administered 2017-12-19: 20 mg via INTRAVENOUS

## 2017-12-19 MED ORDER — PROPOFOL 10 MG/ML IV BOLUS
INTRAVENOUS | Status: AC
  Filled 2017-12-19: qty 60

## 2017-12-19 MED ORDER — PROPOFOL 500 MG/50ML IV EMUL
INTRAVENOUS | Status: DC | PRN
  Administered 2017-12-19: 125 ug/kg/min via INTRAVENOUS

## 2017-12-19 MED ORDER — LACTATED RINGERS IV SOLN
INTRAVENOUS | Status: DC
  Administered 2017-12-19: 1000 mL via INTRAVENOUS

## 2017-12-19 SURGICAL SUPPLY — 14 items

## 2017-12-19 NOTE — H&P (Signed)
Primary Care Physician:  Ricky Mina., MD Primary Gastroenterologist:  Dr. Alessandra Harris  Reason for Visit  : EGD for variceal band ligation  HPI: Ricky Harris is a 78 y.o. male past medical history of cirrhosis and chronic kidney disease for EGD for possible band ligation. Underwent EGD on 08/11/2017  which showed large esophageal varices, 4 bands were placed.it also showed diffuse gastritis, biopsies were negative for H. Pylori.  Had another EGD 10/26/2017 which again showed  large esophageal varices, 5 bands were placed  not a candidate for beta blocker because of baseline bradycardia.   He denies any GI symptoms today.  Past Medical History:  Diagnosis Date  . AAA (abdominal aortic aneurysm) ([place])   . Aortic stenosis   . Arthritis    knees  . Ascites   . Cancer [facility])    prostate  . Chronic kidney disease    stage 4  . 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([place])   . Coronary artery disease    on CT scan  . Gout   . H/O hypercholesterolemia   . Hypertension   . Hypothyroidism   . Liver cirrhosis ([facility])   . Renal artery stenosis ([facility])   . Thrombocytopenia ([facility])   . Wears glasses     Past Surgical History:  Procedure Laterality Date  . CATARACT EXTRACTION W/ INTRAOCULAR LENS  IMPLANT, BILATERAL    . CHOLECYSTECTOMY    . ESOPHAGEAL BANDING N/A 08/11/2017   Procedure: ESOPHAGEAL BANDING;  Surgeon: Ricky Brace, MD;  Location: [facility];  Service: Gastroenterology;  Laterality: N/A;  . ESOPHAGEAL BANDING N/A 10/26/2017   Procedure: ESOPHAGEAL BANDING;  Surgeon: Ricky Brace, MD;  Location: [facility];  Service: Gastroenterology;  Laterality: N/A;  . ESOPHAGOGASTRODUODENOSCOPY (EGD) WITH PROPOFOL N/A 07/05/2017   Procedure: ESOPHAGOGASTRODUODENOSCOPY (EGD) WITH PROPOFOL;  Surgeon: Ricky Brace, MD;  Location: [facility];  Service: Gastroenterology;  Laterality: N/A;  . ESOPHAGOGASTRODUODENOSCOPY (EGD) WITH PROPOFOL N/A 08/11/2017   Procedure:  ESOPHAGOGASTRODUODENOSCOPY (EGD) WITH PROPOFOL;  Surgeon: Ricky Brace, MD;  Location: [facility];  Service: Gastroenterology;  Laterality: N/A;  . ESOPHAGOGASTRODUODENOSCOPY (EGD) WITH PROPOFOL N/A 10/26/2017   Procedure: ESOPHAGOGASTRODUODENOSCOPY (EGD) WITH PROPOFOL;  Surgeon: Ricky Brace, MD;  Location: [facility];  Service: Gastroenterology;  Laterality: N/A;  . HERNIA REPAIR    . PROSTATECTOMY    . subtotal gastrectomy      Prior to Admission medications   Medication Sig Start Date End Date Taking? Authorizing Provider  aspirin EC 81 MG tablet Take 81 mg by mouth daily.   Yes [provider]  atorvastatin (LIPITOR) 10 MG tablet Take 10 mg by mouth daily at 6 PM. 05/06/17  Yes [provider]  carvedilol (COREG) 12.5 MG tablet Take 12.5 mg by mouth 2 (two) times daily. 03/22/17  Yes [provider]  cloNIDine (CATAPRES) 0.1 MG tablet Take 0.1 mg by mouth 2 (two) times daily.   Yes [provider]  colchicine 0.6 MG tablet Take 0.6 mg by mouth daily. 03/22/17  Yes [provider]  febuxostat (ULORIC) 40 MG tablet Take 40 mg by mouth daily.   Yes [provider]  furosemide (LASIX) 20 MG tablet Take 1 tablet (20 mg total) by mouth daily. 05/22/17  Yes Debbe Odea, MD  levothyroxine (SYNTHROID, LEVOTHROID) 25 MCG tablet Take 25 mcg by mouth daily before breakfast.  04/05/17  Yes [provider]  Multiple Vitamins-Minerals (MULTIVITAMIN PO) Take 1 tablet by mouth daily.   Yes [provider]  ondansetron (ZOFRAN) 4 MG tablet Take 4 mg by mouth every 8 (eight) hours as needed for nausea or vomiting.  05/10/17  Yes [provider]  spironolactone (ALDACTONE) 25 MG tablet Take 1 tablet (25 mg total) by mouth daily. 05/22/17  Yes Debbe Odea, MD  traMADol (ULTRAM) 50 MG tablet Take 50 mg by mouth daily as needed for moderate pain.   Yes [provider]  omeprazole (PRILOSEC) 20 MG capsule Take 1  capsule (20 mg total) by mouth daily. Patient not taking: Reported on 08/04/2017 07/05/17 07/05/18  Ricky Brace, MD    Scheduled Meds: Continuous Infusions:  PRN Meds:.  Allergies as of 11/29/2017 - Review Complete 10/26/2017  Allergen Reaction Noted  . Cephalexin Hives 04/01/2015  . Hydralazine Hives 05/20/2017    Family History  Problem Relation Age of Onset  . Diabetes Mellitus II Mother   . Stroke Mother     Social History   Socioeconomic History  . Marital status: Divorced    Spouse name: Not on file  . Number of children: Not on file  . Years of education: Not on file  . Highest education level: Not on file  Occupational History  . Not on file  Social Needs  . Financial resource strain: Not on file  . Food insecurity:    Worry: Not on file    Inability: Not on file  . Transportation needs:    Medical: Not on file    Non-medical: Not on file  Tobacco Use  . Smoking status: Never Smoker  . Smokeless tobacco: Never Used  Substance and Sexual Activity  . Alcohol use: Not Currently  . Drug use: Never  . Sexual activity: Not on file  Lifestyle  . Physical activity:    Days per week: Not on file    Minutes per session: Not on file  . Stress: Not on file  Relationships  . Social connections:    Talks on phone: Not on file    Gets together: Not on file    Attends religious service: Not on file    Active member of club or organization: Not on file    Attends meetings of clubs or organizations: Not on file    Relationship status: Not on file  . Intimate partner violence:    Fear of current or ex partner: Not on file    Emotionally abused: Not on file    Physically abused: Not on file    Forced sexual activity: Not on file  Other Topics Concern  . Not on file  Social History Narrative  . Not on file      Physical Exam: Vital signs: Vitals:   12/19/17 1113  BP: (!) 152/70  Pulse: (!) 49  Resp: 13  Temp: 98.2 F (36.8 C)  SpO2: 100%      General:   Alert,  Well-developed, well-nourished, pleasant and cooperative in NAD Lungs:  Clear throughout to auscultation.   No wheezes, crackles, or rhonchi. No acute distress. Heart:  Regular rate and rhythm; no murmurs, clicks, rubs,  or gallops. Abdomen: ND , nontender, bowel sounds present. No peritoneal signs Rectal:  Deferred  GI:  Lab Results: No results for input(s): WBC, HGB, HCT, PLT in the last 72 hours. BMET No results for input(s): NA, K, CL, CO2, GLUCOSE, BUN, CREATININE, CALCIUM in the last 72 hours. LFT No results for input(s): PROT, ALBUMIN, AST, ALT, ALKPHOS, BILITOT, BILIDIR, IBILI in the last 72 hours. PT/INR No results for input(s):  LABPROT, INR in the last 72 hours.   Studies/Results: No results found.  Impression/Plan: - history of esophageal varices status post band ligation in June 2019 and Aug 2019  decompensated cirrhosis with esophageal varices and ascites.  - Thrombocytopenia. PLT 69  - chronic kidney disease. - bradycardia - History of subtotal gastrectomy for bleeding ulcer 30 years ago    Recommendations -------------------------- - EGD today for possible band ligation.  -  not a candidate for beta blocker because of baseline bradycardia.  Risks (bleeding, infection, bowel perforation that could require surgery, sedation-related changes in cardiopulmonary systems), benefits (identification and possible treatment of source of symptoms, exclusion of certain causes of symptoms), and alternatives (watchful waiting, radiographic imaging studies, empiric medical treatment)  were explained to patientin detail and patient wishes to proceed.    LOS: 0 days   Ricky Brace  MD, FACP 12/19/2017, 12:54 PM  Contact #  [phone]

## 2017-12-19 NOTE — Discharge Instructions (Signed)
Esophagogastroduodenoscopy, Care After °Refer to this sheet in the next few weeks. These instructions provide you with information about caring for yourself after your procedure. Your health care provider may also give you more specific instructions. Your treatment has been planned according to current medical practices, but problems sometimes occur. Call your health care provider if you have any problems or questions after your procedure. °What can I expect after the procedure? °After the procedure, it is common to have: °· A sore throat. °· Nausea. °· Bloating. °· Dizziness. °· Fatigue. ° °Follow these instructions at home: °· Do not eat or drink anything until the numbing medicine (local anesthetic) has worn off and your gag reflex has returned. You will know that the local anesthetic has worn off when you can swallow comfortably. °· Do not drive for 24 hours if you received a medicine to help you relax (sedative). °· If your health care provider took a tissue sample for testing during the procedure, make sure to get your test results. This is your responsibility. Ask your health care provider or the department performing the test when your results will be ready. °· Keep all follow-up visits as told by your health care provider. This is important. °Contact a health care provider if: °· You cannot stop coughing. °· You are not urinating. °· You are urinating less than usual. °Get help right away if: °· You have trouble swallowing. °· You cannot eat or drink. °· You have throat or chest pain that gets worse. °· You are dizzy or light-headed. °· You faint. °· You have nausea or vomiting. °· You have chills. °· You have a fever. °· You have severe abdominal pain. °· You have black, tarry, or bloody stools. °This information is not intended to replace advice given to you by your health care provider. Make sure you discuss any questions you have with your health care provider. °Document Released: 02/01/2012 Document  Revised: 07/23/2015 Document Reviewed: 01/08/2015 °Elsevier Interactive Patient Education © 2018 [employer] ° °

## 2017-12-19 NOTE — Anesthesia Postprocedure Evaluation (Signed)
Anesthesia Post Note  Patient: LIDO MASKE  Procedure(s) Performed: ESOPHAGOGASTRODUODENOSCOPY (EGD) WITH PROPOFOL (N/A ) ESOPHAGEAL BANDING (N/A )     Patient location during evaluation: Endoscopy Anesthesia Type: MAC Level of consciousness: awake and alert Pain management: pain level controlled Vital Signs Assessment: post-procedure vital signs reviewed and stable Respiratory status: spontaneous breathing, nonlabored ventilation, respiratory function stable and patient connected to nasal cannula oxygen Cardiovascular status: blood pressure returned to baseline and stable Postop Assessment: no apparent nausea or vomiting Anesthetic complications: no    Last Vitals:  Vitals:   12/19/17 1320 12/19/17 1330  BP: (!) 170/72   Pulse: (!) 51 (!) 50  Resp: 16 16  Temp:    SpO2: 100% 100%    Last Pain:  Vitals:   12/19/17 1312  TempSrc:   PainSc: 0-No pain                 Jaliza Seifried L Idelia Caudell

## 2017-12-19 NOTE — Anesthesia Preprocedure Evaluation (Addendum)
Anesthesia Evaluation  Patient identified by MRN, date of birth, ID band Patient awake    Reviewed: Allergy & Precautions, NPO status , Patient's Chart, lab work & pertinent test results  Airway Mallampati: II  TM Distance: >3 FB Neck ROM: Full    Dental no notable dental hx. (+) Teeth Intact, Dental Advisory Given   Pulmonary    Pulmonary exam normal breath sounds clear to auscultation       Cardiovascular hypertension, + CAD  Normal cardiovascular exam Rhythm:Regular Rate:Normal  3.4 cm infrarenal abdominal aortic aneurysm   Neuro/Psych    GI/Hepatic (+) Cirrhosis   Esophageal Varices and ascites    ,   Endo/Other  Hypothyroidism   Renal/GU CRFRenal diseaseCKD Stage 4     Musculoskeletal   Abdominal   Peds  Hematology   Anesthesia Other Findings Esophageal varices  Reproductive/Obstetrics                            Anesthesia Physical Anesthesia Plan  ASA: III  Anesthesia Plan: MAC   Post-op Pain Management:    Induction: Intravenous  PONV Risk Score and Plan: 1 and Propofol infusion and Treatment may vary due to age or medical condition  Airway Management Planned: Natural Airway and Simple Face Mask  Additional Equipment:   Intra-op Plan:   Post-operative Plan:   Informed Consent: I have reviewed the patients History and Physical, chart, labs and discussed the procedure including the risks, benefits and alternatives for the proposed anesthesia with the patient or authorized representative who has indicated his/her understanding and acceptance.   Dental advisory given  Plan Discussed with: CRNA  Anesthesia Plan Comments:        Anesthesia Quick Evaluation

## 2017-12-19 NOTE — Transfer of Care (Signed)
Immediate Anesthesia Transfer of Care Note  Patient: [facility]  Procedure(s) Performed: ESOPHAGOGASTRODUODENOSCOPY (EGD) WITH PROPOFOL (N/A ) ESOPHAGEAL BANDING (N/A )  Patient Location: PACU  Anesthesia Type:MAC  Level of Consciousness: sedated  Airway & Oxygen Therapy: Patient Spontanous Breathing and Patient connected to nasal cannula oxygen  Post-op Assessment: Report given to RN and Post -op Vital signs reviewed and stable  Post vital signs: Reviewed and stable  Last Vitals:  Vitals Value Taken Time  BP    Temp    Pulse    Resp    SpO2      Last Pain:  Vitals:   12/19/17 1113  TempSrc: Oral  PainSc: 0-No pain         Complications: No apparent anesthesia complications

## 2017-12-19 NOTE — Op Note (Signed)
[facility] Patient Name: Ricky Harris Procedure Date: 12/19/2017 MRN: [id number] Attending MD: Otis Brace , MD Date of Birth: 05-16-1940 CSN: [id number] Age: 77 Admit Type: Outpatient Procedure:                Upper GI endoscopy Indications:              For therapy of esophageal varices Providers:                Otis Brace, MD, Carlyn Reichert, RN, Cletis Athens, Technician, Red Willow Alday CRNA, CRNA Referring MD:              Medicines:                Sedation Administered by an Anesthesia Professional Complications:            No immediate complications. Estimated Blood Loss:     Estimated blood loss was minimal. Procedure:                Pre-Anesthesia Assessment:                           - Prior to the procedure, a History and Physical                            was performed, and patient medications and                            allergies were reviewed. The patient's tolerance of                            previous anesthesia was also reviewed. The risks                            and benefits of the procedure and the sedation                            options and risks were discussed with the patient.                            All questions were answered, and informed consent                            was obtained. Prior Anticoagulants: The patient has                            taken no previous anticoagulant or antiplatelet                            agents. ASA Grade Assessment: III - A patient with                            severe systemic disease. After reviewing the risks  and benefits, the patient was deemed in                            satisfactory condition to undergo the procedure.                           After obtaining informed consent, the endoscope was                            passed under direct vision. Throughout the                            procedure, the patient's  blood pressure, pulse, and                            oxygen saturations were monitored continuously. The                            [id number]-H190 ([id number] Olympus adult endoscope was                            introduced through the mouth, and advanced to the                            second part of duodenum. The upper GI endoscopy was                            accomplished without difficulty. The patient                            tolerated the procedure well. Scope In: Scope Out: Findings:      Large (> 5 mm) varices were found in the distal esophagus. Three bands       were successfully placed with complete eradication, resulting in       deflation of varices. There was no bleeding during and at the end of the       procedure.      Evidence of a patent Billroth I gastroduodenostomy was found. A gastric       pouch with a large size was found. The gastroduodenal anastomosis was       characterized by healthy appearing mucosa. This was traversed.      Diffuse moderate inflammation was found in the entire examined stomach.      The examined duodenum was normal. 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 Completely                            eradicated. Banded.                           - Patent Billroth I gastroduodenostomy was found,                            characterized by healthy appearing mucosa.                           -  Chronic gastritis.                           - Normal examined duodenum.                           - No specimens collected. Moderate Sedation:      Moderate (conscious) sedation was personally administered by an       anesthesia professional. The following parameters were monitored: oxygen       saturation, heart rate, blood pressure, and response to care. Recommendation:           - Patient has a contact number available for                            emergencies. The signs and symptoms of potential                            delayed  complications were discussed with the                            patient. Return to normal activities tomorrow.                            Written discharge instructions were provided to the                            patient.                           - Resume previous diet.                           - Continue present medications.                           - Repeat upper endoscopy in 4 months for                            retreatment.                           - Return to my office as previously scheduled. Procedure Code(s):        --- Professional ---                           [phone], Esophagogastroduodenoscopy, flexible,                            transoral; with band ligation of esophageal/gastric                            varices Diagnosis Code(s):        --- Professional ---                           I85.00, Esophageal varices without bleeding  Z98.0, Intestinal bypass and anastomosis status                           K29.50, Unspecified chronic gastritis without                            bleeding CPT copyright 2018 American Medical Association. All rights reserved. The codes documented in this report are preliminary and upon coder review may  be revised to meet current compliance requirements. Otis Brace, MD Otis Brace, MD 12/19/2017 1:18:07 PM Number of Addenda: 0

## 2017-12-20 ENCOUNTER — Encounter (HOSPITAL_COMMUNITY): Payer: Self-pay | Admitting: Gastroenterology

## 2018-02-19 ENCOUNTER — Other Ambulatory Visit: Payer: Self-pay | Admitting: Nephrology

## 2018-02-19 DIAGNOSIS — N183 Chronic kidney disease, stage 3 unspecified: Secondary | ICD-10-CM

## 2018-02-26 ENCOUNTER — Ambulatory Visit
Admission: RE | Admit: 2018-02-26 | Discharge: 2018-02-26 | Disposition: A | Discharge status: Home / Self Care | Payer: Medicare Other | Source: Ambulatory Visit | Attending: Nephrology | Admitting: Nephrology

## 2018-02-26 DIAGNOSIS — N183 Chronic kidney disease, stage 3 unspecified: Secondary | ICD-10-CM

## 2018-03-23 NOTE — Discharge Instructions (Signed)
Epoetin Alfa injection °What is this medicine? °EPOETIN ALFA (e POE e tin AL fa) helps your body make more red blood cells. This medicine is used to treat anemia caused by chronic kidney disease, cancer chemotherapy, or HIV-therapy. It may also be used before surgery if you have anemia. °This medicine may be used for other purposes; ask your health care provider or pharmacist if you have questions. °COMMON BRAND NAME(S): Epogen, Procrit, Retacrit °What should I tell my health care provider before I take this medicine? °They need to know if you have any of these conditions: °-cancer °-heart disease °-high blood pressure °-history of blood clots °-history of stroke °-low levels of folate, iron, or vitamin B12 in the blood °-seizures °-an unusual or allergic reaction to erythropoietin, albumin, benzyl alcohol, hamster proteins, other medicines, foods, dyes, or preservatives °-pregnant or trying to get pregnant °-breast-feeding °How should I use this medicine? °This medicine is for injection into a vein or under the skin. It is usually given by a health care professional in a hospital or clinic setting. °If you get this medicine at home, you will be taught how to prepare and give this medicine. Use exactly as directed. Take your medicine at regular intervals. Do not take your medicine more often than directed. °It is important that you put your used needles and syringes in a special sharps container. Do not put them in a trash can. If you do not have a sharps container, call your pharmacist or healthcare provider to get one. °A special MedGuide will be given to you by the pharmacist with each prescription and refill. Be sure to read this information carefully each time. °Talk to your pediatrician regarding the use of this medicine in children. While this drug may be prescribed for selected conditions, precautions do apply. °Overdosage: If you think you have taken too much of this medicine contact a poison control center  or emergency room at once. °NOTE: This medicine is only for you. Do not share this medicine with others. °What if I miss a dose? °If you miss a dose, take it as soon as you can. If it is almost time for your next dose, take only that dose. Do not take double or extra doses. °What may interact with this medicine? °Interactions have not been studied. °This list may not describe all possible interactions. Give your health care provider a list of all the medicines, herbs, non-prescription drugs, or dietary supplements you use. Also tell them if you smoke, drink alcohol, or use illegal drugs. Some items may interact with your medicine. °What should I watch for while using this medicine? °Your condition will be monitored carefully while you are receiving this medicine. °You may need blood work done while you are taking this medicine. °This medicine may cause a decrease in vitamin B6. You should make sure that you get enough vitamin B6 while you are taking this medicine. Discuss the foods you eat and the vitamins you take with your health care professional. °What side effects may I notice from receiving this medicine? °Side effects that you should report to your doctor or health care professional as soon as possible: °-allergic reactions like skin rash, itching or hives, swelling of the face, lips, or tongue °-seizures °-signs and symptoms of a blood clot such as breathing problems; changes in vision; chest pain; severe, sudden headache; pain, swelling, warmth in the leg; trouble speaking; sudden numbness or weakness of the face, arm or leg °-signs and symptoms of a stroke   like changes in vision; confusion; trouble speaking or understanding; severe headaches; sudden numbness or weakness of the face, arm or leg; trouble walking; dizziness; loss of balance or coordination °Side effects that usually do not require medical attention (report to your doctor or health care professional if they continue or are  bothersome): °-chills °-cough °-dizziness °-fever °-headaches °-joint pain °-muscle cramps °-muscle pain °-nausea, vomiting °-pain, redness, or irritation at site where injected °This list may not describe all possible side effects. Call your doctor for medical advice about side effects. You may report side effects to FDA at 1-800-FDA-1088. °Where should I keep my medicine? °Keep out of the reach of children. °Store in a refrigerator between 2 and 8 degrees C (36 and 46 degrees F). Do not freeze or shake. Throw away any unused portion if using a single-dose vial. Multi-dose vials can be kept in the refrigerator for up to 21 days after the initial dose. Throw away unused medicine. °NOTE: This sheet is a summary. It may not cover all possible information. If you have questions about this medicine, talk to your doctor, pharmacist, or health care provider. °© 2019 Elsevier/Gold Standard (2016-09-23 08:35:19) ° °

## 2018-03-26 ENCOUNTER — Ambulatory Visit (HOSPITAL_COMMUNITY)
Admission: RE | Admit: 2018-03-26 | Discharge: 2018-03-26 | Disposition: A | Discharge status: Home / Self Care | Payer: Medicare Other | Source: Ambulatory Visit | Attending: Nephrology | Admitting: Nephrology

## 2018-03-26 VITALS — BP 162/75 | HR 54 | Temp 97.6°F

## 2018-03-26 DIAGNOSIS — N183 Chronic kidney disease, stage 3 unspecified: Secondary | ICD-10-CM

## 2018-03-26 LAB — POCT HEMOGLOBIN-HEMACUE: HEMOGLOBIN: 10.1 g/dL — AB (ref 13.0–17.0)

## 2018-03-26 LAB — FERRITIN: FERRITIN: 68 ng/mL (ref 24–336)

## 2018-03-26 LAB — IRON AND TIBC
Iron: 94 ug/dL (ref 45–182)
SATURATION RATIOS: 38 % (ref 17.9–39.5)
TIBC: 246 ug/dL — AB (ref 250–450)
UIBC: 152 ug/dL

## 2018-03-26 MED ORDER — EPOETIN ALFA-EPBX 10000 UNIT/ML IJ SOLN: 5000.0000 [IU] | INTRAMUSCULAR | Status: DC

## 2018-03-26 MED ORDER — EPOETIN ALFA-EPBX 2000 UNIT/ML IJ SOLN
2000.0000 [IU] | Freq: Once | INTRAMUSCULAR | Status: AC
  Administered 2018-03-26: 2000 [IU] via SUBCUTANEOUS
  Filled 2018-03-26: qty 1

## 2018-03-26 MED ORDER — EPOETIN ALFA-EPBX 3000 UNIT/ML IJ SOLN
3000.0000 [IU] | Freq: Once | INTRAMUSCULAR | Status: AC
  Administered 2018-03-26: 3000 [IU] via SUBCUTANEOUS
  Filled 2018-03-26: qty 1

## 2018-03-28 ENCOUNTER — Other Ambulatory Visit: Payer: Self-pay | Admitting: Gastroenterology

## 2018-04-02 ENCOUNTER — Ambulatory Visit (HOSPITAL_COMMUNITY)
Admission: RE | Admit: 2018-04-02 | Discharge: 2018-04-02 | Disposition: A | Discharge status: Home / Self Care | Payer: Medicare Other | Source: Ambulatory Visit | Attending: Nephrology | Admitting: Nephrology

## 2018-04-02 VITALS — BP 159/76 | HR 54 | Temp 97.7°F | Resp 20

## 2018-04-02 DIAGNOSIS — N183 Chronic kidney disease, stage 3 unspecified: Secondary | ICD-10-CM

## 2018-04-02 LAB — POCT HEMOGLOBIN-HEMACUE: Hemoglobin: 9.7 g/dL — ABNORMAL LOW (ref 13.0–17.0)

## 2018-04-02 MED ORDER — EPOETIN ALFA-EPBX 3000 UNIT/ML IJ SOLN
3000.0000 [IU] | INTRAMUSCULAR | Status: DC
  Administered 2018-04-02: 3000 [IU] via SUBCUTANEOUS
  Filled 2018-04-02: qty 1

## 2018-04-02 MED ORDER — EPOETIN ALFA-EPBX 10000 UNIT/ML IJ SOLN: 5000.0000 [IU] | INTRAMUSCULAR | Status: DC

## 2018-04-02 MED ORDER — EPOETIN ALFA-EPBX 2000 UNIT/ML IJ SOLN
2000.0000 [IU] | INTRAMUSCULAR | Status: DC
  Administered 2018-04-02: 2000 [IU] via SUBCUTANEOUS
  Filled 2018-04-02: qty 1

## 2018-04-09 ENCOUNTER — Encounter (HOSPITAL_COMMUNITY): Payer: Medicare Other

## 2018-04-10 ENCOUNTER — Ambulatory Visit (HOSPITAL_COMMUNITY)
Admission: RE | Admit: 2018-04-10 | Discharge: 2018-04-10 | Disposition: A | Discharge status: Home / Self Care | Payer: Medicare Other | Source: Ambulatory Visit | Attending: Nephrology | Admitting: Nephrology

## 2018-04-10 VITALS — BP 150/71 | HR 54 | Temp 97.8°F | Resp 18

## 2018-04-10 DIAGNOSIS — N183 Chronic kidney disease, stage 3 unspecified: Secondary | ICD-10-CM

## 2018-04-10 LAB — POCT HEMOGLOBIN-HEMACUE: Hemoglobin: 9.5 g/dL — ABNORMAL LOW (ref 13.0–17.0)

## 2018-04-10 MED ORDER — EPOETIN ALFA-EPBX 2000 UNIT/ML IJ SOLN
2000.0000 [IU] | INTRAMUSCULAR | Status: DC
  Administered 2018-04-10: 2000 [IU] via SUBCUTANEOUS
  Filled 2018-04-10: qty 1

## 2018-04-10 MED ORDER — EPOETIN ALFA-EPBX 3000 UNIT/ML IJ SOLN
3000.0000 [IU] | INTRAMUSCULAR | Status: DC
  Administered 2018-04-10: 3000 [IU] via SUBCUTANEOUS
  Filled 2018-04-10: qty 1

## 2018-04-10 MED ORDER — EPOETIN ALFA-EPBX 10000 UNIT/ML IJ SOLN: 5000.0000 [IU] | INTRAMUSCULAR | Status: DC

## 2018-04-17 ENCOUNTER — Ambulatory Visit (HOSPITAL_COMMUNITY)
Admission: RE | Admit: 2018-04-17 | Discharge: 2018-04-17 | Disposition: A | Discharge status: Home / Self Care | Payer: Medicare Other | Source: Ambulatory Visit | Attending: Nephrology | Admitting: Nephrology

## 2018-04-17 VITALS — BP 157/72 | HR 51 | Temp 97.7°F | Resp 20

## 2018-04-17 DIAGNOSIS — N183 Chronic kidney disease, stage 3 unspecified: Secondary | ICD-10-CM

## 2018-04-17 MED ORDER — EPOETIN ALFA-EPBX 10000 UNIT/ML IJ SOLN: 5000.0000 [IU] | INTRAMUSCULAR | Status: DC

## 2018-04-17 MED ORDER — EPOETIN ALFA-EPBX 2000 UNIT/ML IJ SOLN
2000.0000 [IU] | Freq: Once | INTRAMUSCULAR | Status: AC
  Administered 2018-04-17: 2000 [IU] via SUBCUTANEOUS
  Filled 2018-04-17: qty 1

## 2018-04-17 MED ORDER — EPOETIN ALFA-EPBX 3000 UNIT/ML IJ SOLN
3000.0000 [IU] | Freq: Once | INTRAMUSCULAR | Status: AC
  Administered 2018-04-17: 3000 [IU] via SUBCUTANEOUS
  Filled 2018-04-17: qty 1

## 2018-04-18 LAB — POCT HEMOGLOBIN-HEMACUE: Hemoglobin: 9.1 g/dL — ABNORMAL LOW (ref 13.0–17.0)

## 2018-04-19 ENCOUNTER — Other Ambulatory Visit: Payer: Self-pay | Admitting: Gastroenterology

## 2018-04-19 DIAGNOSIS — W19XXXA Unspecified fall, initial encounter: Secondary | ICD-10-CM

## 2018-04-24 ENCOUNTER — Other Ambulatory Visit: Payer: Self-pay | Admitting: Gastroenterology

## 2018-04-24 ENCOUNTER — Ambulatory Visit (HOSPITAL_COMMUNITY)
Admission: RE | Admit: 2018-04-24 | Discharge: 2018-04-24 | Disposition: A | Discharge status: Home / Self Care | Payer: Medicare Other | Source: Ambulatory Visit | Attending: Nephrology | Admitting: Nephrology

## 2018-04-24 VITALS — BP 156/73 | HR 50 | Temp 97.7°F | Resp 18

## 2018-04-24 DIAGNOSIS — I8500 Esophageal varices without bleeding: Secondary | ICD-10-CM

## 2018-04-24 DIAGNOSIS — R1032 Left lower quadrant pain: Secondary | ICD-10-CM

## 2018-04-24 DIAGNOSIS — K7460 Unspecified cirrhosis of liver: Secondary | ICD-10-CM

## 2018-04-24 DIAGNOSIS — N183 Chronic kidney disease, stage 3 unspecified: Secondary | ICD-10-CM

## 2018-04-24 DIAGNOSIS — W19XXXA Unspecified fall, initial encounter: Secondary | ICD-10-CM

## 2018-04-24 DIAGNOSIS — D631 Anemia in chronic kidney disease: Secondary | ICD-10-CM | POA: Insufficient documentation

## 2018-04-24 DIAGNOSIS — M549 Dorsalgia, unspecified: Secondary | ICD-10-CM

## 2018-04-24 DIAGNOSIS — D649 Anemia, unspecified: Secondary | ICD-10-CM

## 2018-04-24 LAB — FERRITIN: Ferritin: 35 ng/mL (ref 24–336)

## 2018-04-24 LAB — POCT HEMOGLOBIN-HEMACUE: Hemoglobin: 9.7 g/dL — ABNORMAL LOW (ref 13.0–17.0)

## 2018-04-24 LAB — IRON AND TIBC
Iron: 82 ug/dL (ref 45–182)
Saturation Ratios: 31 % (ref 17.9–39.5)
TIBC: 262 ug/dL (ref 250–450)
UIBC: 180 ug/dL

## 2018-04-24 MED ORDER — EPOETIN ALFA-EPBX 10000 UNIT/ML IJ SOLN: 5000.0000 [IU] | INTRAMUSCULAR | Status: DC

## 2018-04-24 MED ORDER — EPOETIN ALFA-EPBX 2000 UNIT/ML IJ SOLN
2000.0000 [IU] | Freq: Once | INTRAMUSCULAR | Status: AC
  Administered 2018-04-24: 2000 [IU] via SUBCUTANEOUS
  Filled 2018-04-24: qty 1

## 2018-04-24 MED ORDER — EPOETIN ALFA-EPBX 3000 UNIT/ML IJ SOLN
3000.0000 [IU] | Freq: Once | INTRAMUSCULAR | Status: AC
  Administered 2018-04-24: 3000 [IU] via SUBCUTANEOUS
  Filled 2018-04-24: qty 1

## 2018-04-25 ENCOUNTER — Ambulatory Visit
Admission: RE | Admit: 2018-04-25 | Discharge: 2018-04-25 | Disposition: A | Discharge status: Home / Self Care | Payer: Medicare Other | Source: Ambulatory Visit | Attending: Gastroenterology | Admitting: Gastroenterology

## 2018-04-25 DIAGNOSIS — I8500 Esophageal varices without bleeding: Secondary | ICD-10-CM

## 2018-04-25 DIAGNOSIS — K7460 Unspecified cirrhosis of liver: Secondary | ICD-10-CM

## 2018-04-25 DIAGNOSIS — R1032 Left lower quadrant pain: Secondary | ICD-10-CM

## 2018-04-25 DIAGNOSIS — M549 Dorsalgia, unspecified: Secondary | ICD-10-CM

## 2018-04-25 DIAGNOSIS — W19XXXA Unspecified fall, initial encounter: Secondary | ICD-10-CM

## 2018-04-25 DIAGNOSIS — D649 Anemia, unspecified: Secondary | ICD-10-CM

## 2018-04-26 ENCOUNTER — Encounter (HOSPITAL_COMMUNITY): Payer: Self-pay | Admitting: Certified Registered Nurse Anesthetist

## 2018-04-26 ENCOUNTER — Encounter (HOSPITAL_COMMUNITY): Payer: Self-pay | Admitting: *Deleted

## 2018-04-26 NOTE — Progress Notes (Signed)
Preop call completed with patient .  In completing the preop call noted CT of chest done 04/25/2018 with results showing rib fractures from recent fall.  Patient unaware of results per patient.  Called office of Dr Luan Pulling and made them aware of CT chest results of 04/25/2018.

## 2018-04-27 ENCOUNTER — Ambulatory Visit (HOSPITAL_COMMUNITY): Admission: RE | Admit: 2018-04-27 | Payer: Medicare Other | Source: Home / Self Care | Admitting: Gastroenterology

## 2018-04-27 HISTORY — DX: History of falling: Z91.81

## 2018-04-27 SURGERY — ESOPHAGOGASTRODUODENOSCOPY (EGD) WITH PROPOFOL
Anesthesia: Monitor Anesthesia Care

## 2018-05-01 ENCOUNTER — Ambulatory Visit (HOSPITAL_COMMUNITY)
Admission: RE | Admit: 2018-05-01 | Discharge: 2018-05-01 | Disposition: A | Discharge status: Home / Self Care | Payer: Medicare Other | Source: Ambulatory Visit | Attending: Nephrology | Admitting: Nephrology

## 2018-05-01 ENCOUNTER — Other Ambulatory Visit: Payer: Self-pay

## 2018-05-01 VITALS — BP 163/77 | HR 52 | Temp 97.7°F | Resp 20

## 2018-05-01 DIAGNOSIS — N183 Chronic kidney disease, stage 3 unspecified: Secondary | ICD-10-CM

## 2018-05-01 LAB — POCT HEMOGLOBIN-HEMACUE: Hemoglobin: 10.1 g/dL — ABNORMAL LOW (ref 13.0–17.0)

## 2018-05-01 MED ORDER — EPOETIN ALFA-EPBX 3000 UNIT/ML IJ SOLN
3000.0000 [IU] | Freq: Once | INTRAMUSCULAR | Status: AC
  Administered 2018-05-01: 3000 [IU] via SUBCUTANEOUS
  Filled 2018-05-01: qty 1

## 2018-05-01 MED ORDER — EPOETIN ALFA-EPBX 10000 UNIT/ML IJ SOLN: 5000.0000 [IU] | INTRAMUSCULAR | Status: DC

## 2018-05-01 MED ORDER — EPOETIN ALFA-EPBX 2000 UNIT/ML IJ SOLN
2000.0000 [IU] | Freq: Once | INTRAMUSCULAR | Status: AC
  Administered 2018-05-01: 2000 [IU] via SUBCUTANEOUS
  Filled 2018-05-01: qty 1

## 2018-05-02 ENCOUNTER — Other Ambulatory Visit: Payer: Self-pay

## 2018-05-02 ENCOUNTER — Ambulatory Visit: Payer: Medicare Other | Admitting: Vascular Surgery

## 2018-05-02 ENCOUNTER — Encounter: Payer: Self-pay | Admitting: Vascular Surgery

## 2018-05-02 VITALS — BP 168/82 | HR 52 | Temp 97.2°F | Resp 18 | Ht 72.0 in | Wt 179.8 lb

## 2018-05-02 DIAGNOSIS — I714 Abdominal aortic aneurysm, without rupture, unspecified: Secondary | ICD-10-CM

## 2018-05-02 NOTE — Progress Notes (Signed)
REASON FOR CONSULT:    Abdominal aortic aneurysm.  The consult is requested by Dr. Luan Pulling.  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  I have explained that in a normal risk patient we would consider elective repair at 5.3 cm.  Given his age I think he would be at increased risk for repair.  In addition he has stage stage III chronic kidney disease and a systolic murmur suggesting aortic stenosis.  Given that he has ascites and cirrhosis and also a penile implant with hardware the right lower quadrant certainly would be best to avoid open repair if at all possible.  Hopefully he would be a candidate for endovascular repair.  Based on his CT scan which was done without contrast I think he might potentially be a candidate for endovascular repair if the aneurysm enlarges.  I have ordered a follow-up CT scan in 6 months and I will see him back at that time.  He knows to call sooner if he has problems.  STAGE III CHRONIC KIDNEY DISEASE: Because of this we will not be able to use contrast for his CT scan follow-up in 6 months.  This would certainly complicate endovascular repair as we would have to use CO2 with very limited contrast.  He is followed by Dr. Edrick Oh, if the aneurysm does enlarge and he needs to be considered for elective repair that certainly he would need preoperative evaluation by Dr. Justin Mend.  SYSTOLIC EJECTION MURMUR: He is aware of this murmur.  He denies any significant symptoms associated with this.  If ultimately the aneurysm enlarges and we need to consider elective repair then certainly he will need a thorough cardiac work-up preoperatively.  PROMINENT POPLITEAL PULSES: This patient has prominent popliteal artery pulses and therefore when he comes in in 6 months for his follow-up CT scan I have ordered bilateral duplex scans to evaluate for popliteal artery aneurysms.  He has no evidence of  embolic disease on exam.  I explained that there is some association between abdominal aortic aneurysms and popliteal artery aneurysms.  Deitra Mayo, MD, FACS Beeper [phone] Office: [phone]   HPI:   Ricky Harris is a pleasant 78 y.o. male, who was referred for evaluation of an abdominal aortic aneurysm.  This patient had a fall in February in which she fell 5 feet from a window.  He was having left lower quadrant pain and left rib pain.  The patient has a history of cirrhosis.  He underwent a CT of the chest abdomen and pelvis.  This showed fractures of the left seventh eighth ninth and 10th ribs.  He had a moderate to large amount of ascites.  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  An incidental finding was a 5.3 cm infrarenal abdominal aortic aneurysm.  This had increased in size compared to 4.8 cm in March 2019.  Currently, the patient denies any abdominal pain or back pain.  He was aware that he had an aneurysm and this was followed by his primary care physician.  There is no family history of aneurysmal disease that he is aware of.  He denies any history of claudication or rest pain.  He has a history of cirrhosis and ascites.  PAST SURGICAL HISTORY: He has had previous partial gastrectomy, cholecystectomy, and a penile implant.   Past Medical History:  Diagnosis Date  . AAA (abdominal aortic aneurysm) ([place])   .  Aortic stenosis   . Arthritis    knees  . Ascites   . Cancer [facility])    prostate  . Chronic kidney disease    stage 4  . 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 Coronary artery disease    on CT scan  . Gout   . H/O hypercholesterolemia   . History of falling    recent- see ct chest results of 04/25/2018  . Hypertension   . Hypothyroidism   . Liver cirrhosis ([place])   . Renal artery stenosis ([place])   . Thrombocytopenia ([place])   . Wears glasses     Family History  Problem Relation Age of Onset  . Diabetes  Mellitus II Mother   . Stroke Mother     SOCIAL HISTORY: Social History   Socioeconomic History  . Marital status: Divorced    Spouse name: Not on file  . Number of children: Not on file  . Years of education: Not on file  . Highest education level: Not on file  Occupational History  . Not on file  Social Needs  . Financial resource strain: Not on file  . Food insecurity:    Worry: Not on file    Inability: Not on file  . Transportation needs:    Medical: Not on file    Non-medical: Not on file  Tobacco Use  . Smoking status: Never Smoker  . Smokeless tobacco: Never Used  Substance and Sexual Activity  . Alcohol use: Not Currently  . Drug use: Never  . Sexual activity: Not on file  Lifestyle  . Physical activity:    Days per week: Not on file    Minutes per session: Not on file  . Stress: Not on file  Relationships  . Social connections:    Talks on phone: Not on file    Gets together: Not on file    Attends religious service: Not on file    Active member of club or organization: Not on file    Attends meetings of clubs or organizations: Not on file    Relationship status: Not on file  . Intimate partner violence:    Fear of current or ex partner: Not on file    Emotionally abused: Not on file    Physically abused: Not on file    Forced sexual activity: Not on file  Other Topics Concern  . Not on file  Social History Narrative  . Not on file    Allergies  Allergen Reactions  . Cephalexin Hives  . Hydralazine Hives    Current Outpatient Medications  Medication Sig Dispense Refill  . aspirin EC 81 MG tablet Take 81 mg by mouth at bedtime.     Marland Kitchen atorvastatin (LIPITOR) 10 MG tablet Take 10 mg by mouth daily at 6 PM.    . carvedilol (COREG) 12.5 MG tablet Take 12.5 mg by mouth 2 (two) times daily.    . cloNIDine (CATAPRES) 0.1 MG tablet Take 0.1 mg by mouth 2 (two) times daily.    . colchicine 0.6 MG tablet Take 0.6 mg by mouth daily.    . febuxostat  (ULORIC) 40 MG tablet Take 40 mg by mouth daily.    . furosemide (LASIX) 40 MG tablet Take 40 mg by mouth daily.    Marland Kitchen levothyroxine (SYNTHROID, LEVOTHROID) 25 MCG tablet Take 25 mcg by mouth daily before breakfast.     . Multiple Vitamin (MULTIVITAMIN WITH MINERALS) TABS tablet Take 1  tablet by mouth daily.    . pantoprazole (PROTONIX) 40 MG tablet Take 1 tablet (40 mg total) by mouth daily. 90 tablet 3  . sodium bicarbonate 650 MG tablet Take 650 mg by mouth 2 (two) times daily.    Marland Kitchen spironolactone (ALDACTONE) 50 MG tablet Take 50 mg by mouth daily.    . traMADol (ULTRAM) 50 MG tablet Take 50 mg by mouth 2 (two) times daily as needed for moderate pain.      No current facility-administered medications for this visit.     REVIEW OF SYSTEMS:  [X]  denotes positive finding, [ ]  denotes negative finding Cardiac  Comments:  Chest pain or chest pressure:    Shortness of breath upon exertion:    Short of breath when lying flat:    Irregular heart rhythm:        Vascular    Pain in calf, thigh, or hip brought on by ambulation:    Pain in feet at night that wakes you up from your sleep:     Blood clot in your veins:    Leg swelling:         Pulmonary    Oxygen at home:    Productive cough:     Wheezing:         Neurologic    Sudden weakness in arms or legs:     Sudden numbness in arms or legs:     Sudden onset of difficulty speaking or slurred speech:    Temporary loss of vision in one eye:     Problems with dizziness:         Gastrointestinal    Blood in stool:     Vomited blood:         Genitourinary    Burning when urinating:     Blood in urine:        Psychiatric    Major depression:         Hematologic    Bleeding problems:    Problems with blood clotting too easily:        Skin    Rashes or ulcers:        Constitutional    Fever or chills:     PHYSICAL EXAM:   Vitals:   05/02/18 1504  BP: (!) 168/82  Pulse: (!) 52  Resp: 18  Temp: (!) 97.2 F (36.2 C)    SpO2: 98%  Weight: 179 lb 12.6 oz (81.5 kg)  Height: 6' (1.829 m)    GENERAL: The patient is a well-nourished male, in no acute distress. The vital signs are documented above. CARDIAC: There is a regular rate and rhythm.  He has a systolic ejection murmur. VASCULAR: I do not detect carotid bruits. He has palpable femoral pulses popliteal pulses and pedal pulses bilaterally.  His popliteal pulses are somewhat prominent. PULMONARY: There is good air exchange bilaterally without wheezing or rales. ABDOMEN: Soft and non-tender with normal pitched bowel sounds.  He has a palpable abdominal aortic aneurysm which is nontender.  He has a midline incision from previous gastrectomy.  He also has some hardware for his penile prosthesis in his right lower quadrant. MUSCULOSKELETAL: There are no major deformities or cyanosis. NEUROLOGIC: No focal weakness or paresthesias are detected. SKIN: There are no ulcers or rashes noted. PSYCHIATRIC: The patient has a normal affect.  DATA:    LABS: Labs on 07/05/2017 showed a creatinine of 1.8 with a GFR of 35.  CT CHEST ABDOMEN PELVIS: I have  reviewed his CT of the chest abdomen pelvis.  He has a 5.3 cm infrarenal abdominal aortic aneurysm.  The CT scan was done without contrast given his chronic kidney disease.  He appears to have an adequate neck although it is difficult to assess without contrast.  He does have some calcific disease in his aorta.  He did have ascites noted.

## 2018-05-08 ENCOUNTER — Other Ambulatory Visit: Payer: Self-pay

## 2018-05-08 ENCOUNTER — Ambulatory Visit (HOSPITAL_COMMUNITY)
Admission: RE | Admit: 2018-05-08 | Discharge: 2018-05-08 | Disposition: A | Discharge status: Home / Self Care | Payer: Medicare Other | Source: Ambulatory Visit | Attending: Nephrology | Admitting: Nephrology

## 2018-05-08 VITALS — BP 169/75 | HR 51 | Temp 97.9°F | Resp 20

## 2018-05-08 DIAGNOSIS — N183 Chronic kidney disease, stage 3 unspecified: Secondary | ICD-10-CM

## 2018-05-08 LAB — POCT HEMOGLOBIN-HEMACUE: Hemoglobin: 9.1 g/dL — ABNORMAL LOW (ref 13.0–17.0)

## 2018-05-08 MED ORDER — EPOETIN ALFA-EPBX 10000 UNIT/ML IJ SOLN: 5000.0000 [IU] | INTRAMUSCULAR | Status: DC

## 2018-05-08 MED ORDER — EPOETIN ALFA-EPBX 3000 UNIT/ML IJ SOLN
3000.0000 [IU] | Freq: Once | INTRAMUSCULAR | Status: AC
  Administered 2018-05-08: 3000 [IU] via SUBCUTANEOUS
  Filled 2018-05-08: qty 1

## 2018-05-08 MED ORDER — EPOETIN ALFA-EPBX 2000 UNIT/ML IJ SOLN
2000.0000 [IU] | Freq: Once | INTRAMUSCULAR | Status: AC
  Administered 2018-05-08: 2000 [IU] via SUBCUTANEOUS
  Filled 2018-05-08: qty 1

## 2018-05-10 ENCOUNTER — Encounter: Payer: Medicare Other | Admitting: Vascular Surgery

## 2018-05-15 ENCOUNTER — Other Ambulatory Visit: Payer: Self-pay

## 2018-05-15 ENCOUNTER — Ambulatory Visit (HOSPITAL_COMMUNITY)
Admission: RE | Admit: 2018-05-15 | Discharge: 2018-05-15 | Disposition: A | Discharge status: Home / Self Care | Payer: Medicare Other | Source: Ambulatory Visit | Attending: Nephrology | Admitting: Nephrology

## 2018-05-15 ENCOUNTER — Encounter (HOSPITAL_COMMUNITY): Payer: Medicare Other

## 2018-05-15 VITALS — BP 166/69 | HR 88 | Temp 97.9°F | Resp 20

## 2018-05-15 DIAGNOSIS — N183 Chronic kidney disease, stage 3 unspecified: Secondary | ICD-10-CM

## 2018-05-15 DIAGNOSIS — D631 Anemia in chronic kidney disease: Secondary | ICD-10-CM | POA: Diagnosis not present

## 2018-05-15 DIAGNOSIS — Z79899 Other long term (current) drug therapy: Secondary | ICD-10-CM | POA: Insufficient documentation

## 2018-05-15 DIAGNOSIS — Z5181 Encounter for therapeutic drug level monitoring: Secondary | ICD-10-CM | POA: Diagnosis not present

## 2018-05-15 LAB — IRON AND TIBC
Iron: 82 ug/dL (ref 45–182)
Saturation Ratios: 34 % (ref 17.9–39.5)
TIBC: 244 ug/dL — AB (ref 250–450)
UIBC: 162 ug/dL

## 2018-05-15 LAB — FERRITIN: Ferritin: 40 ng/mL (ref 24–336)

## 2018-05-15 MED ORDER — EPOETIN ALFA-EPBX 10000 UNIT/ML IJ SOLN: 5000.0000 [IU] | INTRAMUSCULAR | Status: DC

## 2018-05-15 MED ORDER — EPOETIN ALFA-EPBX 3000 UNIT/ML IJ SOLN
3000.0000 [IU] | Freq: Once | INTRAMUSCULAR | Status: AC
  Administered 2018-05-15: 3000 [IU] via SUBCUTANEOUS
  Filled 2018-05-15: qty 1

## 2018-05-15 MED ORDER — EPOETIN ALFA-EPBX 2000 UNIT/ML IJ SOLN
2000.0000 [IU] | Freq: Once | INTRAMUSCULAR | Status: AC
  Administered 2018-05-15: 2000 [IU] via SUBCUTANEOUS
  Filled 2018-05-15: qty 1

## 2018-05-16 LAB — POCT HEMOGLOBIN-HEMACUE: Hemoglobin: 10 g/dL — ABNORMAL LOW (ref 13.0–17.0)

## 2018-05-21 ENCOUNTER — Other Ambulatory Visit: Payer: Self-pay

## 2018-05-22 ENCOUNTER — Ambulatory Visit (HOSPITAL_COMMUNITY)
Admission: RE | Admit: 2018-05-22 | Discharge: 2018-05-22 | Disposition: A | Discharge status: Home / Self Care | Payer: Medicare Other | Source: Ambulatory Visit | Attending: Nephrology | Admitting: Nephrology

## 2018-05-22 VITALS — BP 176/94 | HR 57 | Temp 97.8°F | Resp 20

## 2018-05-22 DIAGNOSIS — N183 Chronic kidney disease, stage 3 unspecified: Secondary | ICD-10-CM

## 2018-05-22 LAB — POCT HEMOGLOBIN-HEMACUE: Hemoglobin: 10.2 g/dL — ABNORMAL LOW (ref 13.0–17.0)

## 2018-05-22 MED ORDER — EPOETIN ALFA-EPBX 2000 UNIT/ML IJ SOLN
2000.0000 [IU] | Freq: Once | INTRAMUSCULAR | Status: DC
  Filled 2018-05-22: qty 1

## 2018-05-22 MED ORDER — EPOETIN ALFA-EPBX 3000 UNIT/ML IJ SOLN
3000.0000 [IU] | Freq: Once | INTRAMUSCULAR | Status: DC
  Filled 2018-05-22: qty 1

## 2018-05-22 MED ORDER — EPOETIN ALFA-EPBX 10000 UNIT/ML IJ SOLN
5000.0000 [IU] | INTRAMUSCULAR | Status: DC
  Administered 2018-05-22: 5000 [IU] via SUBCUTANEOUS
  Filled 2018-05-22: qty 1

## 2018-05-29 ENCOUNTER — Encounter (HOSPITAL_COMMUNITY)
Admission: RE | Admit: 2018-05-29 | Discharge: 2018-05-29 | Disposition: A | Discharge status: Home / Self Care | Payer: Medicare Other | Source: Ambulatory Visit | Attending: Nephrology | Admitting: Nephrology

## 2018-05-29 ENCOUNTER — Other Ambulatory Visit: Payer: Self-pay

## 2018-05-29 VITALS — BP 171/81 | HR 58 | Temp 98.4°F

## 2018-05-29 DIAGNOSIS — N183 Chronic kidney disease, stage 3 unspecified: Secondary | ICD-10-CM

## 2018-05-29 LAB — POCT HEMOGLOBIN-HEMACUE: HEMOGLOBIN: 10.1 g/dL — AB (ref 13.0–17.0)

## 2018-05-29 MED ORDER — EPOETIN ALFA-EPBX 3000 UNIT/ML IJ SOLN
3000.0000 [IU] | Freq: Once | INTRAMUSCULAR | Status: AC
  Administered 2018-05-29: 3000 [IU] via SUBCUTANEOUS
  Filled 2018-05-29: qty 1

## 2018-05-29 MED ORDER — EPOETIN ALFA-EPBX 10000 UNIT/ML IJ SOLN: 5000.0000 [IU] | INTRAMUSCULAR | Status: DC

## 2018-05-29 MED ORDER — EPOETIN ALFA-EPBX 2000 UNIT/ML IJ SOLN
2000.0000 [IU] | Freq: Once | INTRAMUSCULAR | Status: AC
  Administered 2018-05-29: 2000 [IU] via SUBCUTANEOUS
  Filled 2018-05-29: qty 1

## 2018-06-04 ENCOUNTER — Other Ambulatory Visit: Payer: Self-pay

## 2018-06-05 ENCOUNTER — Ambulatory Visit (HOSPITAL_COMMUNITY)
Admission: RE | Admit: 2018-06-05 | Discharge: 2018-06-05 | Disposition: A | Discharge status: Home / Self Care | Payer: Medicare Other | Source: Ambulatory Visit | Attending: Nephrology | Admitting: Nephrology

## 2018-06-05 VITALS — BP 179/84 | HR 79 | Temp 97.6°F | Resp 20

## 2018-06-05 DIAGNOSIS — N183 Chronic kidney disease, stage 3 unspecified: Secondary | ICD-10-CM

## 2018-06-05 LAB — POCT HEMOGLOBIN-HEMACUE: Hemoglobin: 9.8 g/dL — ABNORMAL LOW (ref 13.0–17.0)

## 2018-06-05 MED ORDER — EPOETIN ALFA-EPBX 10000 UNIT/ML IJ SOLN
10000.0000 [IU] | INTRAMUSCULAR | Status: DC
  Administered 2018-06-05: 10000 [IU] via SUBCUTANEOUS
  Filled 2018-06-05: qty 1

## 2018-06-19 ENCOUNTER — Other Ambulatory Visit: Payer: Self-pay

## 2018-06-19 ENCOUNTER — Ambulatory Visit (HOSPITAL_COMMUNITY)
Admission: RE | Admit: 2018-06-19 | Discharge: 2018-06-19 | Disposition: A | Discharge status: Home / Self Care | Payer: Medicare Other | Source: Ambulatory Visit | Attending: Nephrology | Admitting: Nephrology

## 2018-06-19 VITALS — BP 158/81 | HR 91 | Temp 97.2°F | Resp 20

## 2018-06-19 DIAGNOSIS — N183 Chronic kidney disease, stage 3 unspecified: Secondary | ICD-10-CM

## 2018-06-19 DIAGNOSIS — D631 Anemia in chronic kidney disease: Secondary | ICD-10-CM | POA: Diagnosis not present

## 2018-06-19 LAB — IRON AND TIBC
Iron: 88 ug/dL (ref 45–182)
Saturation Ratios: 37 % (ref 17.9–39.5)
TIBC: 241 ug/dL — ABNORMAL LOW (ref 250–450)
UIBC: 153 ug/dL

## 2018-06-19 LAB — FERRITIN: Ferritin: 66 ng/mL (ref 24–336)

## 2018-06-19 LAB — POCT HEMOGLOBIN-HEMACUE: Hemoglobin: 9.7 g/dL — ABNORMAL LOW (ref 13.0–17.0)

## 2018-06-19 MED ORDER — DARBEPOETIN ALFA 100 MCG/0.5ML IJ SOSY
PREFILLED_SYRINGE | INTRAMUSCULAR | Status: AC
  Filled 2018-06-19: qty 0.5

## 2018-06-19 MED ORDER — DARBEPOETIN ALFA 100 MCG/0.5ML IJ SOSY
100.0000 ug | PREFILLED_SYRINGE | INTRAMUSCULAR | Status: DC
  Administered 2018-06-19: 100 ug via SUBCUTANEOUS

## 2018-07-17 ENCOUNTER — Ambulatory Visit (HOSPITAL_COMMUNITY)
Admission: RE | Admit: 2018-07-17 | Discharge: 2018-07-17 | Disposition: A | Discharge status: Home / Self Care | Payer: Medicare Other | Source: Ambulatory Visit | Attending: Nephrology | Admitting: Nephrology

## 2018-07-17 VITALS — BP 136/67 | HR 61 | Temp 97.2°F | Resp 20

## 2018-07-17 DIAGNOSIS — N183 Chronic kidney disease, stage 3 unspecified: Secondary | ICD-10-CM

## 2018-07-17 DIAGNOSIS — D631 Anemia in chronic kidney disease: Secondary | ICD-10-CM | POA: Insufficient documentation

## 2018-07-17 LAB — IRON AND TIBC
Iron: 67 ug/dL (ref 45–182)
Saturation Ratios: 27 % (ref 17.9–39.5)
TIBC: 246 ug/dL — ABNORMAL LOW (ref 250–450)
UIBC: 179 ug/dL

## 2018-07-17 LAB — POCT HEMOGLOBIN-HEMACUE: Hemoglobin: 9.4 g/dL — ABNORMAL LOW (ref 13.0–17.0)

## 2018-07-17 LAB — FERRITIN: Ferritin: 58 ng/mL (ref 24–336)

## 2018-07-17 MED ORDER — DARBEPOETIN ALFA 100 MCG/0.5ML IJ SOSY
PREFILLED_SYRINGE | INTRAMUSCULAR | Status: AC
  Administered 2018-07-18: 100 ug via SUBCUTANEOUS
  Filled 2018-07-17: qty 0.5

## 2018-07-17 MED ORDER — DARBEPOETIN ALFA 100 MCG/0.5ML IJ SOSY: 100.0000 ug | PREFILLED_SYRINGE | INTRAMUSCULAR | Status: DC

## 2018-08-14 ENCOUNTER — Ambulatory Visit (HOSPITAL_COMMUNITY)
Admission: RE | Admit: 2018-08-14 | Discharge: 2018-08-14 | Disposition: A | Discharge status: Home / Self Care | Payer: Medicare Other | Source: Ambulatory Visit | Attending: Nephrology | Admitting: Nephrology

## 2018-08-14 ENCOUNTER — Other Ambulatory Visit: Payer: Self-pay

## 2018-08-14 VITALS — BP 161/76 | HR 53 | Temp 95.9°F | Resp 20

## 2018-08-14 DIAGNOSIS — N183 Chronic kidney disease, stage 3 unspecified: Secondary | ICD-10-CM

## 2018-08-14 DIAGNOSIS — D631 Anemia in chronic kidney disease: Secondary | ICD-10-CM | POA: Diagnosis not present

## 2018-08-14 LAB — POCT HEMOGLOBIN-HEMACUE: Hemoglobin: 9.6 g/dL — ABNORMAL LOW (ref 13.0–17.0)

## 2018-08-14 LAB — IRON AND TIBC
Iron: 80 ug/dL (ref 45–182)
Saturation Ratios: 30 % (ref 17.9–39.5)
TIBC: 263 ug/dL (ref 250–450)
UIBC: 183 ug/dL

## 2018-08-14 LAB — FERRITIN: Ferritin: 61 ng/mL (ref 24–336)

## 2018-08-14 MED ORDER — DARBEPOETIN ALFA 100 MCG/0.5ML IJ SOSY: 150.0000 ug | PREFILLED_SYRINGE | INTRAMUSCULAR | Status: DC

## 2018-08-14 MED ORDER — DARBEPOETIN ALFA 150 MCG/0.3ML IJ SOSY
PREFILLED_SYRINGE | INTRAMUSCULAR | Status: AC
  Administered 2018-08-14: 150 ug
  Filled 2018-08-14: qty 0.3

## 2018-09-10 ENCOUNTER — Other Ambulatory Visit (HOSPITAL_COMMUNITY): Payer: Self-pay | Admitting: *Deleted

## 2018-09-11 ENCOUNTER — Other Ambulatory Visit: Payer: Self-pay

## 2018-09-11 ENCOUNTER — Encounter (HOSPITAL_COMMUNITY)
Admission: RE | Admit: 2018-09-11 | Discharge: 2018-09-11 | Disposition: A | Discharge status: Home / Self Care | Payer: Medicare Other | Source: Ambulatory Visit | Attending: Nephrology | Admitting: Nephrology

## 2018-09-11 VITALS — BP 167/73 | HR 48 | Temp 97.6°F | Resp 20

## 2018-09-11 DIAGNOSIS — D631 Anemia in chronic kidney disease: Secondary | ICD-10-CM | POA: Diagnosis not present

## 2018-09-11 DIAGNOSIS — N183 Chronic kidney disease, stage 3 unspecified: Secondary | ICD-10-CM

## 2018-09-11 LAB — RENAL FUNCTION PANEL
Albumin: 3 g/dL — ABNORMAL LOW (ref 3.5–5.0)
Anion gap: 7 (ref 5–15)
BUN: 64 mg/dL — ABNORMAL HIGH (ref 8–23)
CO2: 18 mmol/L — ABNORMAL LOW (ref 22–32)
Calcium: 8.4 mg/dL — ABNORMAL LOW (ref 8.9–10.3)
Chloride: 112 mmol/L — ABNORMAL HIGH (ref 98–111)
Creatinine, Ser: 3.15 mg/dL — ABNORMAL HIGH (ref 0.61–1.24)
GFR calc Af Amer: 21 mL/min — ABNORMAL LOW (ref >60)
GFR calc non Af Amer: 18 mL/min — ABNORMAL LOW (ref >60)
Glucose, Bld: 142 mg/dL — ABNORMAL HIGH (ref 70–99)
Phosphorus: 5 mg/dL — ABNORMAL HIGH (ref 2.5–4.6)
Potassium: 5.7 mmol/L — ABNORMAL HIGH (ref 3.5–5.1)
Sodium: 137 mmol/L (ref 135–145)

## 2018-09-11 LAB — FERRITIN: Ferritin: 60 ng/mL (ref 24–336)

## 2018-09-11 LAB — POCT HEMOGLOBIN-HEMACUE: Hemoglobin: 9.7 g/dL — ABNORMAL LOW (ref 13.0–17.0)

## 2018-09-11 LAB — IRON AND TIBC
Iron: 103 ug/dL (ref 45–182)
Saturation Ratios: 35 % (ref 17.9–39.5)
TIBC: 295 ug/dL (ref 250–450)
UIBC: 192 ug/dL

## 2018-09-11 MED ORDER — DARBEPOETIN ALFA 100 MCG/0.5ML IJ SOSY: 150.0000 ug | PREFILLED_SYRINGE | INTRAMUSCULAR | Status: DC

## 2018-09-11 MED ORDER — DARBEPOETIN ALFA 150 MCG/0.3ML IJ SOSY
PREFILLED_SYRINGE | INTRAMUSCULAR | Status: AC
  Administered 2018-09-11: 150 ug via SUBCUTANEOUS
  Filled 2018-09-11: qty 0.3

## 2018-09-19 ENCOUNTER — Other Ambulatory Visit: Payer: Self-pay | Admitting: Gastroenterology

## 2018-09-19 DIAGNOSIS — K7460 Unspecified cirrhosis of liver: Secondary | ICD-10-CM

## 2018-09-20 ENCOUNTER — Ambulatory Visit
Admission: RE | Admit: 2018-09-20 | Discharge: 2018-09-20 | Disposition: A | Discharge status: Home / Self Care | Payer: Medicare Other | Source: Ambulatory Visit | Attending: Gastroenterology | Admitting: Gastroenterology

## 2018-09-20 DIAGNOSIS — K7460 Unspecified cirrhosis of liver: Secondary | ICD-10-CM

## 2018-10-09 ENCOUNTER — Other Ambulatory Visit: Payer: Self-pay

## 2018-10-09 ENCOUNTER — Encounter (HOSPITAL_COMMUNITY)
Admission: RE | Admit: 2018-10-09 | Discharge: 2018-10-09 | Disposition: A | Discharge status: Home / Self Care | Payer: Medicare Other | Source: Ambulatory Visit | Attending: Nephrology | Admitting: Nephrology

## 2018-10-09 VITALS — BP 160/85 | HR 63 | Temp 97.7°F | Resp 20

## 2018-10-09 DIAGNOSIS — N183 Chronic kidney disease, stage 3 unspecified: Secondary | ICD-10-CM

## 2018-10-09 DIAGNOSIS — D631 Anemia in chronic kidney disease: Secondary | ICD-10-CM | POA: Diagnosis not present

## 2018-10-09 LAB — IRON AND TIBC
Iron: 77 ug/dL (ref 45–182)
Saturation Ratios: 33 % (ref 17.9–39.5)
TIBC: 237 ug/dL — ABNORMAL LOW (ref 250–450)
UIBC: 160 ug/dL

## 2018-10-09 LAB — POCT HEMOGLOBIN-HEMACUE: Hemoglobin: 9.8 g/dL — ABNORMAL LOW (ref 13.0–17.0)

## 2018-10-09 LAB — FERRITIN: Ferritin: 91 ng/mL (ref 24–336)

## 2018-10-09 MED ORDER — DARBEPOETIN ALFA 150 MCG/0.3ML IJ SOSY
PREFILLED_SYRINGE | INTRAMUSCULAR | Status: AC
  Administered 2018-10-09: 150 ug via SUBCUTANEOUS
  Filled 2018-10-09: qty 0.3

## 2018-10-09 MED ORDER — DARBEPOETIN ALFA 100 MCG/0.5ML IJ SOSY: 150.0000 ug | PREFILLED_SYRINGE | INTRAMUSCULAR | Status: DC

## 2018-10-24 ENCOUNTER — Other Ambulatory Visit: Payer: Self-pay | Admitting: Vascular Surgery

## 2018-10-24 DIAGNOSIS — I714 Abdominal aortic aneurysm, without rupture, unspecified: Secondary | ICD-10-CM

## 2018-10-25 ENCOUNTER — Other Ambulatory Visit (HOSPITAL_COMMUNITY)
Admission: RE | Admit: 2018-10-25 | Discharge: 2018-10-25 | Disposition: A | Discharge status: Home / Self Care | Payer: Medicare Other | Source: Ambulatory Visit | Attending: Gastroenterology | Admitting: Gastroenterology

## 2018-10-25 ENCOUNTER — Other Ambulatory Visit: Payer: Self-pay

## 2018-10-25 ENCOUNTER — Encounter (HOSPITAL_COMMUNITY): Payer: Self-pay | Admitting: Emergency Medicine

## 2018-10-25 ENCOUNTER — Other Ambulatory Visit: Payer: Self-pay | Admitting: Vascular Surgery

## 2018-10-25 DIAGNOSIS — Z20828 Contact with and (suspected) exposure to other viral communicable diseases: Secondary | ICD-10-CM | POA: Insufficient documentation

## 2018-10-25 DIAGNOSIS — Z01812 Encounter for preprocedural laboratory examination: Secondary | ICD-10-CM | POA: Diagnosis present

## 2018-10-25 DIAGNOSIS — I8500 Esophageal varices without bleeding: Secondary | ICD-10-CM | POA: Diagnosis not present

## 2018-10-25 LAB — SARS CORONAVIRUS 2 (TAT 6-24 HRS): SARS Coronavirus 2: NEGATIVE

## 2018-10-29 ENCOUNTER — Ambulatory Visit (HOSPITAL_COMMUNITY): Payer: Medicare Other | Admitting: Physician Assistant

## 2018-10-29 ENCOUNTER — Encounter (HOSPITAL_COMMUNITY): Payer: Self-pay

## 2018-10-29 ENCOUNTER — Encounter (HOSPITAL_COMMUNITY)
Admission: RE | Disposition: A | Discharge status: Home / Self Care | Payer: Self-pay | Source: Home / Self Care | Attending: Gastroenterology

## 2018-10-29 ENCOUNTER — Other Ambulatory Visit: Payer: Self-pay

## 2018-10-29 ENCOUNTER — Ambulatory Visit (HOSPITAL_COMMUNITY)
Admission: RE | Admit: 2018-10-29 | Discharge: 2018-10-29 | Disposition: A | Discharge status: Home / Self Care | Payer: Medicare Other | Attending: Gastroenterology | Admitting: Gastroenterology

## 2018-10-29 DIAGNOSIS — Z79899 Other long term (current) drug therapy: Secondary | ICD-10-CM | POA: Insufficient documentation

## 2018-10-29 DIAGNOSIS — I739 Peripheral vascular disease, unspecified: Secondary | ICD-10-CM | POA: Diagnosis not present

## 2018-10-29 DIAGNOSIS — Z881 Allergy status to other antibiotic agents status: Secondary | ICD-10-CM | POA: Diagnosis not present

## 2018-10-29 DIAGNOSIS — Z888 Allergy status to other drugs, medicaments and biological substances status: Secondary | ICD-10-CM | POA: Insufficient documentation

## 2018-10-29 DIAGNOSIS — N184 Chronic kidney disease, stage 4 (severe): Secondary | ICD-10-CM | POA: Insufficient documentation

## 2018-10-29 DIAGNOSIS — I129 Hypertensive chronic kidney disease with stage 1 through stage 4 chronic kidney disease, or unspecified chronic kidney disease: Secondary | ICD-10-CM | POA: Insufficient documentation

## 2018-10-29 DIAGNOSIS — Z980 Intestinal bypass and anastomosis status: Secondary | ICD-10-CM | POA: Diagnosis not present

## 2018-10-29 DIAGNOSIS — E039 Hypothyroidism, unspecified: Secondary | ICD-10-CM | POA: Diagnosis not present

## 2018-10-29 DIAGNOSIS — E7800 Pure hypercholesterolemia, unspecified: Secondary | ICD-10-CM | POA: Insufficient documentation

## 2018-10-29 DIAGNOSIS — M109 Gout, unspecified: Secondary | ICD-10-CM | POA: Diagnosis not present

## 2018-10-29 DIAGNOSIS — I8510 Secondary esophageal varices without bleeding: Secondary | ICD-10-CM | POA: Insufficient documentation

## 2018-10-29 DIAGNOSIS — I714 Abdominal aortic aneurysm, without rupture: Secondary | ICD-10-CM | POA: Diagnosis not present

## 2018-10-29 DIAGNOSIS — I2510 Atherosclerotic heart disease of native coronary artery without angina pectoris: Secondary | ICD-10-CM | POA: Insufficient documentation

## 2018-10-29 DIAGNOSIS — R7303 Prediabetes: Secondary | ICD-10-CM | POA: Diagnosis not present

## 2018-10-29 DIAGNOSIS — Z7982 Long term (current) use of aspirin: Secondary | ICD-10-CM | POA: Insufficient documentation

## 2018-10-29 DIAGNOSIS — R188 Other ascites: Secondary | ICD-10-CM | POA: Diagnosis not present

## 2018-10-29 DIAGNOSIS — M170 Bilateral primary osteoarthritis of knee: Secondary | ICD-10-CM | POA: Diagnosis not present

## 2018-10-29 DIAGNOSIS — K7460 Unspecified cirrhosis of liver: Secondary | ICD-10-CM | POA: Insufficient documentation

## 2018-10-29 DIAGNOSIS — Z79890 Hormone replacement therapy (postmenopausal): Secondary | ICD-10-CM | POA: Insufficient documentation

## 2018-10-29 HISTORY — PX: ESOPHAGOGASTRODUODENOSCOPY (EGD) WITH PROPOFOL: SHX5813

## 2018-10-29 HISTORY — DX: Prediabetes: R73.03

## 2018-10-29 LAB — POCT I-STAT 4, (NA,K, GLUC, HGB,HCT)
Glucose, Bld: 98 mg/dL (ref 70–99)
HCT: 27 % — ABNORMAL LOW (ref 39.0–52.0)
Hemoglobin: 9.2 g/dL — ABNORMAL LOW (ref 13.0–17.0)
Potassium: 4.6 mmol/L (ref 3.5–5.1)
Sodium: 142 mmol/L (ref 135–145)

## 2018-10-29 SURGERY — ESOPHAGOGASTRODUODENOSCOPY (EGD) WITH PROPOFOL
Anesthesia: Monitor Anesthesia Care

## 2018-10-29 MED ORDER — PROPOFOL 10 MG/ML IV BOLUS
INTRAVENOUS | Status: AC
  Filled 2018-10-29: qty 40

## 2018-10-29 MED ORDER — SODIUM CHLORIDE 0.9 % IV SOLN
INTRAVENOUS | Status: DC
  Administered 2018-10-29: 09:00:00 via INTRAVENOUS

## 2018-10-29 MED ORDER — PROPOFOL 500 MG/50ML IV EMUL
INTRAVENOUS | Status: DC | PRN
  Administered 2018-10-29: 175 ug/kg/min via INTRAVENOUS

## 2018-10-29 SURGICAL SUPPLY — 15 items

## 2018-10-29 NOTE — Transfer of Care (Signed)
Immediate Anesthesia Transfer of Care Note  Patient: Ricky Harris  Procedure(s) Performed: ESOPHAGOGASTRODUODENOSCOPY (EGD) WITH PROPOFOL (N/A ) ESOPHAGEAL BANDING (N/A )  Patient Location: PACU  Anesthesia Type:MAC  Level of Consciousness: sedated, patient cooperative and responds to stimulation  Airway & Oxygen Therapy: Patient Spontanous Breathing and Patient connected to face mask oxygen  Post-op Assessment: Report given to RN and Post -op Vital signs reviewed and stable  Post vital signs: Reviewed and stable  Last Vitals:  Vitals Value Taken Time  BP 95/46 10/29/18 1038  Temp    Pulse 53 10/29/18 1039  Resp 14 10/29/18 1039  SpO2 99 % 10/29/18 1039  Vitals shown include unvalidated device data.  Last Pain:  Vitals:   10/29/18 0906  TempSrc: Oral  PainSc: 0-No pain         Complications: No apparent anesthesia complications

## 2018-10-29 NOTE — Op Note (Signed)
[facility] Patient Name: Ricky Harris Procedure Date: 10/29/2018 MRN: [id number] Attending MD: Otis Brace , MD Date of Birth: 1941-02-04 CSN: [id number] Age: 78 Admit Type: Outpatient Procedure:                Upper GI endoscopy Indications:              Follow-up of esophageal varices, For therapy of                            esophageal varices Providers:                Otis Brace, MD, Cleda Daub, RN, Truddie Coco,                            RN Referring MD:              Medicines:                Sedation Administered by an Anesthesia Professional Complications:            No immediate complications. Estimated Blood Loss:     Estimated blood loss was minimal. Estimated blood                            loss: none. Procedure:                Pre-Anesthesia Assessment:                           - Prior to the procedure, a History and Physical                            was performed, and patient medications and                            allergies were reviewed. The patient's tolerance of                            previous anesthesia was also reviewed. The risks                            and benefits of the procedure and the sedation                            options and risks were discussed with the patient.                            All questions were answered, and informed consent                            was obtained. Prior Anticoagulants: The patient has                            taken no previous anticoagulant or antiplatelet                            agents. ASA  Grade Assessment: III - A patient with                            severe systemic disease. After reviewing the risks                            and benefits, the patient was deemed in                            satisfactory condition to undergo the procedure.                           After obtaining informed consent, the endoscope was                            passed under direct  vision. Throughout the                            procedure, the patient's blood pressure, pulse, and                            oxygen saturations were monitored continuously. The                            GIF-H190 ([id number]) Olympus gastroscope was                            introduced through the mouth, and advanced to the                            second part of duodenum. The upper GI endoscopy was                            performed with difficulty due to presence of food.                            The patient tolerated the procedure well. Scope In: Scope Out: Findings:      Grade I, small (< 5 mm) varices were found in the distal esophagus.      A large amount of food (residue) was found in the gastric body.      Evidence of a patent Billroth I gastroduodenostomy was found. A gastric       pouch with a normal size was found containing food debris. This was       traversed.      The exam of the duodenum was otherwise normal. Impression:               - Grade I and small (< 5 mm) esophageal varices.                           - A large amount of food (residue) in the stomach.                           - Patent Billroth I gastroduodenostomy was  found.                           - No specimens collected. Moderate Sedation:      Moderate (conscious) sedation was personally administered by an       anesthesia professional. The following parameters were monitored: oxygen       saturation, heart rate, blood pressure, and response to care. Recommendation:           - Patient has a contact number available for                            emergencies. The signs and symptoms of potential                            delayed complications were discussed with the                            patient. Return to normal activities tomorrow.                            Written discharge instructions were provided to the                            patient.                           - Resume previous  diet.                           - Continue present medications.                           - Repeat upper endoscopy in 1 year for surveillance.                           - Return to my office as previously scheduled. Procedure Code(s):        --- Professional ---                           [phone], Esophagogastroduodenoscopy, flexible,                            transoral; diagnostic, including collection of                            specimen(s) by brushing or washing, when performed                            (separate procedure) Diagnosis Code(s):        --- Professional ---                           I85.00, Esophageal varices without bleeding                           Z98.0, Intestinal bypass and anastomosis status CPT copyright 2019  American Medical Association. All rights reserved. The codes documented in this report are preliminary and upon coder review may  be revised to meet current compliance requirements. Otis Brace, MD Otis Brace, MD 10/29/2018 10:41:20 AM Number of Addenda: 0

## 2018-10-29 NOTE — Discharge Instructions (Signed)

## 2018-10-29 NOTE — H&P (Signed)
Primary Care Physician:  [name]., MD Primary Gastroenterologist:  Dr. Alessandra Bevels  Reason for Visit  : EGD for variceal band ligation  HPI: Ricky Harris is a 78 y.o. male past medical history of cirrhosis and chronic kidney disease for EGD for possible band ligation. Underwent EGD on 10/ 2019  which showed large esophageal varices,  bands were placed.it also showed diffuse gastritis, previous gastric biopsies were negative for H. Pylori.   Diuretics currently on hold because of worsening kidney functions.  Denies any acute GI issues today.  Denies any blood in the stool or black stool.  not a candidate for beta blocker because of baseline bradycardia.  Past Medical History:  Diagnosis Date  . AAA (abdominal aortic aneurysm) ([place])    5.3cm , magd by vascular Dr Scot Dock ,  . Aortic stenosis   . Arthritis    knees  . Ascites   . Cancer [facility])    prostate  . Chronic kidney disease    stage 4, mgd by Dr Justin Mend   . 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([place])   . Coronary artery disease    on CT scan  . Gout   . H/O hypercholesterolemia   . History of falling    recent- see ct chest results of 04/25/2018  . Hypertension   . Hypothyroidism   . Liver cirrhosis ([place])   . Prediabetes    denies   . Renal artery stenosis ([place])   . Thrombocytopenia ([place])   . Wears glasses     Past Surgical History:  Procedure Laterality Date  . CATARACT EXTRACTION W/ INTRAOCULAR LENS  IMPLANT, BILATERAL    . CHOLECYSTECTOMY    . ESOPHAGEAL BANDING N/A 08/11/2017   Procedure: ESOPHAGEAL BANDING;  Surgeon: Otis Brace, MD;  Location: [place];  Service: Gastroenterology;  Laterality: N/A;  . ESOPHAGEAL BANDING N/A 10/26/2017   Procedure: ESOPHAGEAL BANDING;  Surgeon: Otis Brace, MD;  Location: [facility];  Service: Gastroenterology;  Laterality: N/A;  . ESOPHAGEAL BANDING N/A 12/19/2017   Procedure: ESOPHAGEAL BANDING;  Surgeon: Otis Brace, MD;  Location: [facility];   Service: Gastroenterology;  Laterality: N/A;  . ESOPHAGOGASTRODUODENOSCOPY (EGD) WITH PROPOFOL N/A 07/05/2017   Procedure: ESOPHAGOGASTRODUODENOSCOPY (EGD) WITH PROPOFOL;  Surgeon: Otis Brace, MD;  Location: [facility];  Service: Gastroenterology;  Laterality: N/A;  . ESOPHAGOGASTRODUODENOSCOPY (EGD) WITH PROPOFOL N/A 08/11/2017   Procedure: ESOPHAGOGASTRODUODENOSCOPY (EGD) WITH PROPOFOL;  Surgeon: Otis Brace, MD;  Location: [facility];  Service: Gastroenterology;  Laterality: N/A;  . ESOPHAGOGASTRODUODENOSCOPY (EGD) WITH PROPOFOL N/A 10/26/2017   Procedure: ESOPHAGOGASTRODUODENOSCOPY (EGD) WITH PROPOFOL;  Surgeon: Otis Brace, MD;  Location: [facility];  Service: Gastroenterology;  Laterality: N/A;  . ESOPHAGOGASTRODUODENOSCOPY (EGD) WITH PROPOFOL N/A 12/19/2017   Procedure: ESOPHAGOGASTRODUODENOSCOPY (EGD) WITH PROPOFOL;  Surgeon: Otis Brace, MD;  Location: [facility];  Service: Gastroenterology;  Laterality: N/A;  . HERNIA REPAIR    . PROSTATECTOMY    . subtotal gastrectomy      Prior to Admission medications   Medication Sig Start Date End Date Taking? Authorizing Provider  aspirin EC 81 MG tablet Take 81 mg by mouth daily.   Yes [provider]  atorvastatin (LIPITOR) 10 MG tablet Take 10 mg by mouth daily at 6 PM. 05/06/17  Yes [provider]  carvedilol (COREG) 12.5 MG tablet Take 12.5 mg by mouth 2 (two) times daily. 03/22/17  Yes [provider]  cloNIDine (CATAPRES) 0.1 MG tablet Take 0.1 mg by mouth 2 (two) times daily.  Yes [provider]  colchicine 0.6 MG tablet Take 0.6 mg by mouth daily. 03/22/17  Yes [provider]  febuxostat (ULORIC) 40 MG tablet Take 40 mg by mouth daily.   Yes [provider]  furosemide (LASIX) 20 MG tablet Take 1 tablet (20 mg total) by mouth daily. 05/22/17  Yes Debbe Odea, MD  levothyroxine (SYNTHROID, LEVOTHROID) 25 MCG tablet Take 25 mcg by mouth daily before  breakfast.  04/05/17  Yes [provider]  Multiple Vitamins-Minerals (MULTIVITAMIN PO) Take 1 tablet by mouth daily.   Yes [provider]  ondansetron (ZOFRAN) 4 MG tablet Take 4 mg by mouth every 8 (eight) hours as needed for nausea or vomiting.  05/10/17  Yes [provider]  spironolactone (ALDACTONE) 25 MG tablet Take 1 tablet (25 mg total) by mouth daily. 05/22/17  Yes Debbe Odea, MD  traMADol (ULTRAM) 50 MG tablet Take 50 mg by mouth daily as needed for moderate pain.   Yes [provider]  omeprazole (PRILOSEC) 20 MG capsule Take 1 capsule (20 mg total) by mouth daily. Patient not taking: Reported on 08/04/2017 07/05/17 07/05/18  Otis Brace, MD    Scheduled Meds: Continuous Infusions:  PRN Meds:.  Allergies as of 09/19/2018 - Review Complete 09/11/2018  Allergen Reaction Noted  . Cephalexin Hives 04/01/2015  . Hydralazine Hives 05/20/2017    Family History  Problem Relation Age of Onset  . Diabetes Mellitus II Mother   . Stroke Mother     Social History   Socioeconomic History  . Marital status: Divorced    Spouse name: Not on file  . Number of children: Not on file  . Years of education: Not on file  . Highest education level: Not on file  Occupational History  . Not on file  Social Needs  . Financial resource strain: Not on file  . Food insecurity    Worry: Not on file    Inability: Not on file  . Transportation needs    Medical: Not on file    Non-medical: Not on file  Tobacco Use  . Smoking status: Never Smoker  . Smokeless tobacco: Never Used  Substance and Sexual Activity  . Alcohol use: Not Currently  . Drug use: Never  . Sexual activity: Not on file  Lifestyle  . Physical activity    Days per week: Not on file    Minutes per session: Not on file  . Stress: Not on file  Relationships  . Social Herbalist on phone: Not on file    Gets together: Not on file    Attends religious service: Not on file     Active member of club or organization: Not on file    Attends meetings of clubs or organizations: Not on file    Relationship status: Not on file  . Intimate partner violence    Fear of current or ex partner: Not on file    Emotionally abused: Not on file    Physically abused: Not on file    Forced sexual activity: Not on file  Other Topics Concern  . Not on file  Social History Narrative  . Not on file      Physical Exam: Vital signs: Vitals:   10/29/18 0906  BP: (!) 209/89  Pulse: (!) 53  Resp: 20  Temp: 98.6 F (37 C)  SpO2: 99%     General:   Alert,  Well-developed, well-nourished, pleasant and cooperative in NAD Lungs:  Clear throughout to auscultation.   No wheezes, crackles, or rhonchi. No acute distress. Heart:  Regular rate and rhythm; no murmurs, clicks, rubs,  or gallops. Abdomen: ND , nontender, bowel sounds present. No peritoneal signs Rectal:  Deferred  GI:  Lab Results: No results for input(s): WBC, HGB, HCT, PLT in the last 72 hours. BMET No results for input(s): NA, K, CL, CO2, GLUCOSE, BUN, CREATININE, CALCIUM in the last 72 hours. LFT No results for input(s): PROT, ALBUMIN, AST, ALT, ALKPHOS, BILITOT, BILIDIR, IBILI in the last 72 hours. PT/INR No results for input(s): LABPROT, INR in the last 72 hours.   Studies/Results: No results found.  Impression/Plan: - history of esophageal varices status post band ligation in June 2019 and Aug 2019 and Oct 2019  decompensated cirrhosis with esophageal varices and ascites.  - Thrombocytopenia. PLT 82 as of 08/2017 - chronic kidney disease. - bradycardia - History of subtotal gastrectomy for bleeding ulcer 30 years ago    Recommendations -------------------------- - EGD today for possible band ligation.  -  not a candidate for beta blocker because of baseline bradycardia.  Risks (bleeding, infection, bowel perforation that could require surgery, sedation-related changes in cardiopulmonary  systems), benefits (identification and possible treatment of source of symptoms, exclusion of certain causes of symptoms), and alternatives (watchful waiting, radiographic imaging studies, empiric medical treatment)  were explained to patientin detail and patient wishes to proceed.    LOS: 0 days   Otis Brace  MD, FACP 10/29/2018, 9:12 AM  Contact #  [phone]

## 2018-10-29 NOTE — Anesthesia Postprocedure Evaluation (Signed)
Anesthesia Post Note  Patient: CHEVIS WEISENSEL  Procedure(s) Performed: ESOPHAGOGASTRODUODENOSCOPY (EGD) WITH PROPOFOL (N/A ) ESOPHAGEAL BANDING (N/A )     Patient location during evaluation: PACU Anesthesia Type: MAC Level of consciousness: awake and alert Pain management: pain level controlled Vital Signs Assessment: post-procedure vital signs reviewed and stable Respiratory status: spontaneous breathing, nonlabored ventilation, respiratory function stable and patient connected to nasal cannula oxygen Cardiovascular status: stable and blood pressure returned to baseline Postop Assessment: no apparent nausea or vomiting Anesthetic complications: no    Last Vitals:  Vitals:   10/29/18 1050 10/29/18 1100  BP: 117/62 (!) 160/66  Pulse: (!) 52 (!) 47  Resp: 15 18  Temp:    SpO2: 94% 96%    Last Pain:  Vitals:   10/29/18 1100  TempSrc:   PainSc: 0-No pain                 Tiajuana Amass

## 2018-10-29 NOTE — Anesthesia Preprocedure Evaluation (Signed)
Anesthesia Evaluation  Patient identified by MRN, date of birth, ID band Patient awake    Reviewed: Allergy & Precautions, NPO status , Patient's Chart, lab work & pertinent test results  Airway Mallampati: II  TM Distance: >3 FB     Dental   Pulmonary neg pulmonary ROS,    breath sounds clear to auscultation       Cardiovascular hypertension, + CAD and + Peripheral Vascular Disease  + Valvular Problems/Murmurs  Rhythm:Regular Rate:Normal     Neuro/Psych negative neurological ROS     GI/Hepatic negative GI ROS, (+) Cirrhosis   Esophageal Varices    ,   Endo/Other  Hypothyroidism   Renal/GU CRFRenal disease     Musculoskeletal  (+) Arthritis ,   Abdominal   Peds  Hematology  (+) anemia ,   Anesthesia Other Findings   Reproductive/Obstetrics                             Anesthesia Physical Anesthesia Plan  ASA: III  Anesthesia Plan: MAC   Post-op Pain Management:    Induction: Intravenous  PONV Risk Score and Plan: 1 and Propofol infusion, Ondansetron and Treatment may vary due to age or medical condition  Airway Management Planned: Nasal Cannula and Natural Airway  Additional Equipment:   Intra-op Plan:   Post-operative Plan:   Informed Consent: I have reviewed the patients History and Physical, chart, labs and discussed the procedure including the risks, benefits and alternatives for the proposed anesthesia with the patient or authorized representative who has indicated his/her understanding and acceptance.       Plan Discussed with: CRNA  Anesthesia Plan Comments:         Anesthesia Quick Evaluation

## 2018-10-30 ENCOUNTER — Encounter (HOSPITAL_COMMUNITY): Payer: Self-pay | Admitting: Gastroenterology

## 2018-11-06 ENCOUNTER — Ambulatory Visit (HOSPITAL_COMMUNITY)
Admission: RE | Admit: 2018-11-06 | Discharge: 2018-11-06 | Disposition: A | Discharge status: Home / Self Care | Payer: Medicare Other | Source: Ambulatory Visit | Attending: Nephrology | Admitting: Nephrology

## 2018-11-06 ENCOUNTER — Other Ambulatory Visit: Payer: Self-pay

## 2018-11-06 VITALS — BP 166/93 | Resp 16

## 2018-11-06 DIAGNOSIS — D631 Anemia in chronic kidney disease: Secondary | ICD-10-CM | POA: Insufficient documentation

## 2018-11-06 DIAGNOSIS — N183 Chronic kidney disease, stage 3 unspecified: Secondary | ICD-10-CM

## 2018-11-06 LAB — IRON AND TIBC
Iron: 82 ug/dL (ref 45–182)
Saturation Ratios: 33 % (ref 17.9–39.5)
TIBC: 249 ug/dL — ABNORMAL LOW (ref 250–450)
UIBC: 167 ug/dL

## 2018-11-06 LAB — POCT HEMOGLOBIN-HEMACUE: Hemoglobin: 10.3 g/dL — ABNORMAL LOW (ref 13.0–17.0)

## 2018-11-06 LAB — FERRITIN: Ferritin: 81 ng/mL (ref 24–336)

## 2018-11-06 MED ORDER — DARBEPOETIN ALFA 150 MCG/0.3ML IJ SOSY
PREFILLED_SYRINGE | INTRAMUSCULAR | Status: AC
  Filled 2018-11-06: qty 0.3

## 2018-11-06 MED ORDER — DARBEPOETIN ALFA 100 MCG/0.5ML IJ SOSY
150.0000 ug | PREFILLED_SYRINGE | INTRAMUSCULAR | Status: DC
  Administered 2018-11-06: 11:00:00 150 ug via SUBCUTANEOUS

## 2018-11-07 MED FILL — Darbepoetin Alfa Soln Prefilled Syringe 150 MCG/0.3ML: INTRAMUSCULAR | Qty: 0.3 | Status: AC

## 2018-11-15 ENCOUNTER — Telehealth (HOSPITAL_COMMUNITY): Payer: Self-pay

## 2018-11-15 ENCOUNTER — Other Ambulatory Visit: Payer: Self-pay

## 2018-11-15 DIAGNOSIS — R0989 Other specified symptoms and signs involving the circulatory and respiratory systems: Secondary | ICD-10-CM

## 2018-11-15 NOTE — Telephone Encounter (Signed)

## 2018-11-16 ENCOUNTER — Ambulatory Visit (HOSPITAL_COMMUNITY)
Admission: RE | Admit: 2018-11-16 | Discharge: 2018-11-16 | Disposition: A | Discharge status: Home / Self Care | Payer: Medicare Other | Source: Ambulatory Visit | Attending: Family | Admitting: Family

## 2018-11-16 ENCOUNTER — Other Ambulatory Visit: Payer: Self-pay

## 2018-11-16 DIAGNOSIS — R0989 Other specified symptoms and signs involving the circulatory and respiratory systems: Secondary | ICD-10-CM | POA: Diagnosis present

## 2018-11-19 ENCOUNTER — Other Ambulatory Visit: Payer: Self-pay

## 2018-11-19 ENCOUNTER — Ambulatory Visit
Admission: RE | Admit: 2018-11-19 | Discharge: 2018-11-19 | Disposition: A | Discharge status: Home / Self Care | Payer: Medicare Other | Source: Ambulatory Visit | Attending: Vascular Surgery | Admitting: Vascular Surgery

## 2018-11-19 DIAGNOSIS — I714 Abdominal aortic aneurysm, without rupture, unspecified: Secondary | ICD-10-CM

## 2018-11-21 ENCOUNTER — Encounter: Payer: Self-pay | Admitting: *Deleted

## 2018-11-21 ENCOUNTER — Ambulatory Visit (INDEPENDENT_AMBULATORY_CARE_PROVIDER_SITE_OTHER): Payer: Medicare Other | Admitting: Vascular Surgery

## 2018-11-21 ENCOUNTER — Other Ambulatory Visit: Payer: Self-pay

## 2018-11-21 ENCOUNTER — Encounter: Payer: Self-pay | Admitting: Vascular Surgery

## 2018-11-21 VITALS — BP 145/83 | HR 55 | Ht 72.0 in | Wt 170.0 lb

## 2018-11-21 DIAGNOSIS — I714 Abdominal aortic aneurysm, without rupture, unspecified: Secondary | ICD-10-CM

## 2018-11-21 NOTE — Progress Notes (Signed)
Patient name: Ricky Harris DOB: 03-16-1940 Sex: male    Referring Provider is Ricky Harris., MD  PCP is Ricky Harris., MD  REASON FOR VIRTUAL VISIT: Follow-up of abdominal aortic aneurysm  I connected with Ricky Harris on 11/21/18 at 10:20 AM EDT by a video enabled telemedicine application and verified that I am speaking with the correct person using two identifiers. I discussed the limitations of evaluation and management by telemedicine and the availability of in person appointments. The patient expressed understanding and agreed to proceed.  Location: Patient: Home Provider: Office  HPI: Ricky Harris is a 78 y.o. male who I last saw on 05/02/2018 with a 5.3 cm infrarenal abdominal aortic aneurysm.  I felt that given his age she would be at increased risk for open repair.  In addition he has stage III chronic kidney disease and evidence of aortic stenosis on exam.  He has ascites and cirrhosis.  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  I ordered a CT scan in 6 months and the call today was discussed those results.  Current Outpatient Medications  Medication Sig Dispense Refill  . aspirin EC 81 MG tablet Take 81 mg by mouth at bedtime.     Marland Kitchen atorvastatin (LIPITOR) 10 MG tablet Take 10 mg by mouth daily at 6 PM.    . carvedilol (COREG) 12.5 MG tablet Take 12.5 mg by mouth 2 (two) times daily.    . cloNIDine (CATAPRES) 0.2 MG tablet Take 0.2 mg by mouth 2 (two) times daily.    . febuxostat (ULORIC) 40 MG tablet Take 40 mg by mouth daily.    Marland Kitchen levothyroxine (SYNTHROID, LEVOTHROID) 25 MCG tablet Take 25 mcg by mouth daily before breakfast.     . pantoprazole (PROTONIX) 40 MG tablet Take 1 tablet (40 mg total) by mouth daily. 90 tablet 3  . sodium bicarbonate 650 MG tablet Take 650 mg by mouth 2 (two) times daily.    . traMADol (ULTRAM) 50 MG tablet Take 50-100 mg by mouth daily as needed for moderate pain.     Marland Kitchen colchicine 0.6 MG tablet Take 0.6  mg by mouth daily as needed (gout flare).     . furosemide (LASIX) 20 MG tablet Take 20 mg by mouth daily.     No current facility-administered medications for this visit.    REVIEW OF SYSTEMS: Valu.Nieves ] denotes positive finding; [  ] denotes negative finding  CARDIOVASCULAR:  [ ]  chest pain   [ ]  dyspnea on exertion  [ ]  leg swelling  CONSTITUTIONAL:  [ ]  fever   [ ]  chills   OBSERVATIONS/OBJECTIVE: Vitals:   11/21/18 1037  BP: (!) 145/83  Pulse: (!) 55  Weight: 170 lb (77.1 kg)  Height: 6' (1.829 m)   Patient was alert and oriented. He did not appear short of breath on the phone.  DATA:  ARTERIAL DUPLEX: I have reviewed his arterial duplex that was done on 11/16/2018.  This showed no evidence of popliteal artery aneurysms.  CT ABDOMEN PELVIS: I reviewed his CT abdomen and pelvis that was done on 11/19/2018.  This was done without contrast.  This shows that the aneurysm has enlarged to 5.5 cm.  There is a large amount of high density mural thrombus within the dominant component of the aneurysm.  Patient has significant intra-abdominal ascites.  He also was noted to have a small pericardial effusion.  LABS: I did review  his labs from 09/11/2018.  This showed a creatinine of 3.15.  GFR was 18.  MEDICAL ISSUES:  ABDOMINAL AORTIC ANEURYSM: The patient's aneurysm is enlarged to 5.5 cm.  Given the enlargement of the aneurysm I think we should consider elective repair.  The situation is complicated by his ascites and prosthetic component to his penile implant in his abdomen.  Therefore I do not think is a good candidate for an open repair.  With his chronic kidney disease I have recommended a CO2 aortogram which is scheduled for 12/13/2018.  I also reviewed the films with Gore and hopefully he will be a candidate for endovascular approach.  I will make further recommendations pending the results of his aortogram.  We have discussed the indications for the procedure the potential complications and he  is agreeable to proceed.  FOLLOW UP INSTRUCTIONS:   I discussed the assessment and treatment plan with the patient. The patient was provided an opportunity to ask questions and all were answered. The patient agreed with the plan and demonstrated an understanding of the instructions. The patient was advised to call back or seek an in-person evaluation if the symptoms worsen or if the condition fails to improve as anticipated.  I provided 25 minutes of non-face-to-face time during this encounter.   Deitra Mayo [facility]

## 2018-11-21 NOTE — Progress Notes (Signed)
Reviewed all instructions for procedure with patient and then mailed all information (see letter) to patient. Verbalized understanding.

## 2018-11-29 ENCOUNTER — Other Ambulatory Visit (HOSPITAL_COMMUNITY): Payer: Self-pay | Admitting: Gastroenterology

## 2018-11-29 ENCOUNTER — Other Ambulatory Visit: Payer: Self-pay | Admitting: Gastroenterology

## 2018-11-29 DIAGNOSIS — R188 Other ascites: Secondary | ICD-10-CM

## 2018-11-29 DIAGNOSIS — K7460 Unspecified cirrhosis of liver: Secondary | ICD-10-CM

## 2018-12-04 ENCOUNTER — Ambulatory Visit (HOSPITAL_COMMUNITY)
Admission: RE | Admit: 2018-12-04 | Discharge: 2018-12-04 | Disposition: A | Discharge status: Home / Self Care | Payer: Medicare Other | Source: Ambulatory Visit | Attending: Nephrology | Admitting: Nephrology

## 2018-12-04 ENCOUNTER — Other Ambulatory Visit: Payer: Self-pay

## 2018-12-04 DIAGNOSIS — D631 Anemia in chronic kidney disease: Secondary | ICD-10-CM | POA: Insufficient documentation

## 2018-12-04 DIAGNOSIS — N183 Chronic kidney disease, stage 3 unspecified: Secondary | ICD-10-CM | POA: Insufficient documentation

## 2018-12-04 LAB — IRON AND TIBC
Iron: 81 ug/dL (ref 45–182)
Saturation Ratios: 40 % — ABNORMAL HIGH (ref 17.9–39.5)
TIBC: 204 ug/dL — ABNORMAL LOW (ref 250–450)
UIBC: 123 ug/dL

## 2018-12-04 LAB — POCT HEMOGLOBIN-HEMACUE: Hemoglobin: 9.6 g/dL — ABNORMAL LOW (ref 13.0–17.0)

## 2018-12-04 LAB — FERRITIN: Ferritin: 105 ng/mL (ref 24–336)

## 2018-12-04 MED ORDER — DARBEPOETIN ALFA 150 MCG/0.3ML IJ SOSY: 150.0000 ug | PREFILLED_SYRINGE | INTRAMUSCULAR | Status: DC

## 2018-12-04 MED ORDER — DARBEPOETIN ALFA 150 MCG/0.3ML IJ SOSY
PREFILLED_SYRINGE | INTRAMUSCULAR | Status: AC
  Administered 2018-12-04: 150 ug
  Filled 2018-12-04: qty 0.3

## 2018-12-05 ENCOUNTER — Ambulatory Visit (HOSPITAL_COMMUNITY)
Admission: RE | Admit: 2018-12-05 | Discharge: 2018-12-05 | Disposition: A | Discharge status: Home / Self Care | Payer: Medicare Other | Source: Ambulatory Visit | Attending: Nephrology | Admitting: Nephrology

## 2018-12-05 ENCOUNTER — Other Ambulatory Visit: Payer: Self-pay

## 2018-12-05 DIAGNOSIS — K7460 Unspecified cirrhosis of liver: Secondary | ICD-10-CM | POA: Insufficient documentation

## 2018-12-05 DIAGNOSIS — R188 Other ascites: Secondary | ICD-10-CM | POA: Diagnosis present

## 2018-12-05 LAB — BODY FLUID CELL COUNT WITH DIFFERENTIAL
Lymphs, Fluid: 40 %
Monocyte-Macrophage-Serous Fluid: 54 % (ref 50–90)
Neutrophil Count, Fluid: 6 % (ref 0–25)
Total Nucleated Cell Count, Fluid: 219 cu mm (ref 0–1000)

## 2018-12-05 MED ORDER — LIDOCAINE HCL 1 % IJ SOLN
INTRAMUSCULAR | Status: AC
  Filled 2018-12-05: qty 10

## 2018-12-05 NOTE — Procedures (Signed)
Ultrasound-guided diagnostic and therapeutic paracentesis performed yielding 5 liters (maximum ordered)of yellow fluid. No immediate complications. A portion of the fluid was sent to the lab for preordered studies. EBL none.

## 2018-12-06 LAB — CYTOLOGY - NON PAP

## 2018-12-08 LAB — BODY FLUID CULTURE: Culture: NO GROWTH

## 2018-12-11 ENCOUNTER — Other Ambulatory Visit (HOSPITAL_COMMUNITY)
Admission: RE | Admit: 2018-12-11 | Discharge: 2018-12-11 | Disposition: A | Discharge status: Home / Self Care | Payer: Medicare Other | Source: Ambulatory Visit | Attending: Vascular Surgery | Admitting: Vascular Surgery

## 2018-12-11 DIAGNOSIS — Z79890 Hormone replacement therapy (postmenopausal): Secondary | ICD-10-CM | POA: Diagnosis not present

## 2018-12-11 DIAGNOSIS — Z7982 Long term (current) use of aspirin: Secondary | ICD-10-CM | POA: Diagnosis not present

## 2018-12-11 DIAGNOSIS — N183 Chronic kidney disease, stage 3 unspecified: Secondary | ICD-10-CM | POA: Diagnosis not present

## 2018-12-11 DIAGNOSIS — I714 Abdominal aortic aneurysm, without rupture: Secondary | ICD-10-CM | POA: Diagnosis not present

## 2018-12-11 DIAGNOSIS — Z79899 Other long term (current) drug therapy: Secondary | ICD-10-CM | POA: Diagnosis not present

## 2018-12-11 DIAGNOSIS — Z01812 Encounter for preprocedural laboratory examination: Secondary | ICD-10-CM | POA: Diagnosis present

## 2018-12-11 DIAGNOSIS — Z20828 Contact with and (suspected) exposure to other viral communicable diseases: Secondary | ICD-10-CM | POA: Diagnosis not present

## 2018-12-12 LAB — NOVEL CORONAVIRUS, NAA (HOSP ORDER, SEND-OUT TO REF LAB; TAT 18-24 HRS): SARS-CoV-2, NAA: NOT DETECTED

## 2018-12-14 ENCOUNTER — Other Ambulatory Visit: Payer: Self-pay

## 2018-12-14 ENCOUNTER — Encounter (HOSPITAL_COMMUNITY)
Admission: RE | Disposition: A | Discharge status: Home / Self Care | Payer: Self-pay | Source: Home / Self Care | Attending: Vascular Surgery

## 2018-12-14 ENCOUNTER — Ambulatory Visit (HOSPITAL_COMMUNITY)
Admission: RE | Admit: 2018-12-14 | Discharge: 2018-12-14 | Disposition: A | Discharge status: Home / Self Care | Payer: Medicare Other | Attending: Vascular Surgery | Admitting: Vascular Surgery

## 2018-12-14 ENCOUNTER — Other Ambulatory Visit: Payer: Self-pay | Admitting: *Deleted

## 2018-12-14 DIAGNOSIS — N183 Chronic kidney disease, stage 3 unspecified: Secondary | ICD-10-CM | POA: Diagnosis not present

## 2018-12-14 DIAGNOSIS — Z79890 Hormone replacement therapy (postmenopausal): Secondary | ICD-10-CM | POA: Diagnosis not present

## 2018-12-14 DIAGNOSIS — Z79899 Other long term (current) drug therapy: Secondary | ICD-10-CM | POA: Diagnosis not present

## 2018-12-14 DIAGNOSIS — Z20828 Contact with and (suspected) exposure to other viral communicable diseases: Secondary | ICD-10-CM | POA: Insufficient documentation

## 2018-12-14 DIAGNOSIS — Z7982 Long term (current) use of aspirin: Secondary | ICD-10-CM | POA: Diagnosis not present

## 2018-12-14 DIAGNOSIS — I714 Abdominal aortic aneurysm, without rupture: Secondary | ICD-10-CM

## 2018-12-14 HISTORY — PX: ABDOMINAL AORTOGRAM W/LOWER EXTREMITY: CATH118223

## 2018-12-14 LAB — POCT I-STAT, CHEM 8
BUN: 51 mg/dL — ABNORMAL HIGH (ref 8–23)
Calcium, Ion: 1.14 mmol/L — ABNORMAL LOW (ref 1.15–1.40)
Chloride: 111 mmol/L (ref 98–111)
Creatinine, Ser: 3.7 mg/dL — ABNORMAL HIGH (ref 0.61–1.24)
Glucose, Bld: 94 mg/dL (ref 70–99)
HCT: 27 % — ABNORMAL LOW (ref 39.0–52.0)
Hemoglobin: 9.2 g/dL — ABNORMAL LOW (ref 13.0–17.0)
Potassium: 4.9 mmol/L (ref 3.5–5.1)
Sodium: 141 mmol/L (ref 135–145)
TCO2: 22 mmol/L (ref 22–32)

## 2018-12-14 SURGERY — ABDOMINAL AORTOGRAM W/LOWER EXTREMITY
Anesthesia: LOCAL | Laterality: Bilateral

## 2018-12-14 MED ORDER — MIDAZOLAM HCL 2 MG/2ML IJ SOLN
INTRAMUSCULAR | Status: AC
  Filled 2018-12-14: qty 2

## 2018-12-14 MED ORDER — LABETALOL HCL 5 MG/ML IV SOLN: 10.0000 mg | INTRAVENOUS | Status: DC | PRN

## 2018-12-14 MED ORDER — FENTANYL CITRATE (PF) 100 MCG/2ML IJ SOLN
INTRAMUSCULAR | Status: AC
  Filled 2018-12-14: qty 2

## 2018-12-14 MED ORDER — ONDANSETRON HCL 4 MG/2ML IJ SOLN: 4.0000 mg | Freq: Four times a day (QID) | INTRAMUSCULAR | Status: DC | PRN

## 2018-12-14 MED ORDER — LIDOCAINE HCL (PF) 1 % IJ SOLN
INTRAMUSCULAR | Status: AC
  Filled 2018-12-14: qty 30

## 2018-12-14 MED ORDER — HYDRALAZINE HCL 20 MG/ML IJ SOLN
5.0000 mg | INTRAMUSCULAR | Status: DC | PRN
  Administered 2018-12-14: 5 mg via INTRAVENOUS

## 2018-12-14 MED ORDER — LIDOCAINE HCL (PF) 1 % IJ SOLN
INTRAMUSCULAR | Status: DC | PRN
  Administered 2018-12-14: 15 mL via INTRADERMAL

## 2018-12-14 MED ORDER — SODIUM CHLORIDE 0.9 % WEIGHT BASED INFUSION: 1.0000 mL/kg/h | INTRAVENOUS | Status: DC

## 2018-12-14 MED ORDER — MIDAZOLAM HCL 2 MG/2ML IJ SOLN
INTRAMUSCULAR | Status: DC | PRN
  Administered 2018-12-14: 1 mg via INTRAVENOUS

## 2018-12-14 MED ORDER — SODIUM CHLORIDE 0.9 % IV SOLN: 250.0000 mL | INTRAVENOUS | Status: DC | PRN

## 2018-12-14 MED ORDER — HEPARIN (PORCINE) IN NACL 1000-0.9 UT/500ML-% IV SOLN
INTRAVENOUS | Status: AC
  Filled 2018-12-14: qty 500

## 2018-12-14 MED ORDER — HEPARIN (PORCINE) IN NACL 1000-0.9 UT/500ML-% IV SOLN
INTRAVENOUS | Status: DC | PRN
  Administered 2018-12-14 (×2): 500 mL

## 2018-12-14 MED ORDER — SODIUM CHLORIDE 0.9 % IV SOLN
INTRAVENOUS | Status: DC
  Administered 2018-12-14: 08:00:00 via INTRAVENOUS

## 2018-12-14 MED ORDER — ACETAMINOPHEN 325 MG PO TABS: 650.0000 mg | ORAL_TABLET | ORAL | Status: DC | PRN

## 2018-12-14 MED ORDER — SODIUM CHLORIDE 0.9% FLUSH: 3.0000 mL | Freq: Two times a day (BID) | INTRAVENOUS | Status: DC

## 2018-12-14 MED ORDER — SODIUM CHLORIDE 0.9% FLUSH: 3.0000 mL | INTRAVENOUS | Status: DC | PRN

## 2018-12-14 MED ORDER — HYDRALAZINE HCL 20 MG/ML IJ SOLN
INTRAMUSCULAR | Status: AC
  Filled 2018-12-14: qty 1

## 2018-12-14 MED ORDER — FENTANYL CITRATE (PF) 100 MCG/2ML IJ SOLN
INTRAMUSCULAR | Status: DC | PRN
  Administered 2018-12-14: 50 ug via INTRAVENOUS

## 2018-12-14 SURGICAL SUPPLY — 15 items
CATH ACCU-VU SIZ PIG 5F 70CM (CATHETERS) ×2 IMPLANT
CATH ANGIO 5F PIGTAIL 65CM (CATHETERS) IMPLANT
CATH CROSS OVER TEMPO 5F (CATHETERS) ×2 IMPLANT
FILTER CO2 0.2 MICRON (VASCULAR PRODUCTS) ×6 IMPLANT
KIT MICROPUNCTURE NIT STIFF (SHEATH) ×2 IMPLANT
KIT PV (KITS) ×2 IMPLANT
RESERVOIR CO2 (VASCULAR PRODUCTS) ×4 IMPLANT
SET FLUSH CO2 (MISCELLANEOUS) ×2 IMPLANT
SHEATH PINNACLE 5F 10CM (SHEATH) ×2 IMPLANT
SHEATH PROBE COVER 6X72 (BAG) ×2 IMPLANT
STOPCOCK MORSE 400PSI 3WAY (MISCELLANEOUS) ×2 IMPLANT
TRANSDUCER W/STOPCOCK (MISCELLANEOUS) ×2 IMPLANT
TRAY PV CATH (CUSTOM PROCEDURE TRAY) ×2 IMPLANT
TUBING CIL FLEX 10 FLL-RA (TUBING) ×2 IMPLANT
WIRE HITORQ VERSACORE ST 145CM (WIRE) ×2 IMPLANT

## 2018-12-14 NOTE — Progress Notes (Signed)
5Fr arterial sheath removed from right groin level 0 pre and post sheath pull. Right DP's +2. Manual pressure held for  20 minutes. Clean dressing applies w/ gauze and tegaderm. VSS and care instructions given to pt. Bedrest started at 11:20 for 4 hours per order.

## 2018-12-14 NOTE — Op Note (Signed)
   PATIENT: Ricky Harris      MRN: [id number] DOB: 05-Aug-1940    DATE OF PROCEDURE: 12/14/2018  INDICATIONS:    Ricky Harris is a 78 y.o. male with a 5.5 cm infrarenal abdominal aortic aneurysm.  He has chronic kidney disease and presents for CO2 arteriogram as he could not have a CT angiogram.  PROCEDURE:    1.  Conscious sedation 2.  Ultrasound-guided access to the right common femoral artery 3.  CO2 aortogram with bilateral iliac arteriogram 4.  Selective catheterization of the left common iliac artery with left common iliac arteriography  SURGEON: Judeth Cornfield. Scot Dock, MD, FACS  ANESTHESIA: Local with sedation  EBL: Minimal  TECHNIQUE: The patient was brought to the peripheral vascular lab and was sedated. The period of conscious sedation was 43 minutes.  During that time period, I was present face-to-face 100% of the time.  The patient was administered 1 mg of Versed and 50 mcg of fentanyl. The patient's heart rate, blood pressure, and oxygen saturation were monitored by the nurse continuously during the procedure.  Both groins were prepped and draped in the usual sterile fashion.  Under ultrasound guidance, after the skin was anesthetized, I cannulated the right common femoral artery with a micropuncture needle and a micropuncture sheath was introduced over a wire.  This was exchanged for a 5 [place] sheath over a Bentson wire.  By ultrasound the femoral artery was patent. A real-time image was obtained and sent to the server.  A pigtail catheter was positioned at the L1 vertebral body and flush aortogram obtained.  Several injections were made to identify the level of the renal arteries.  Next the catheter was brought above the bifurcation and an oblique iliac projection was obtained.  It was difficult to visualize the left common iliac artery and external iliac artery.  I therefore exchanged the pigtail catheter for a crossover catheter which was positioned into the left common  iliac artery selective left common iliac arteriogram was obtained.  Catheter was then removed over a wire.  The patient was transferred to the holding area for removal of the sheath.  No immediate complications were noted.  FINDINGS:   The neck of the aneurysm is approximately 2 and half centimeters in length.  I do not see any significant occlusive disease in the neck.  The size of the aneurysm cannot accurately be determined by this study.  The common iliac arteries, external iliac arteries are widely patent.  CLINICAL NOTE: The patient appears to be a good candidate for endovascular aneurysm repair.  This will have to be done with CO2 and IVIS given his renal insufficiency.  We are awaiting cardiac clearance.  Deitra Mayo, MD, FACS [facility]  DATE OF DICTATION:   12/14/2018

## 2018-12-14 NOTE — Progress Notes (Signed)
Discharge instructions reviewed with patient and son, [name]. Verbalized understanding.

## 2018-12-14 NOTE — Discharge Instructions (Signed)
Femoral Site Care °This sheet gives you information about how to care for yourself after your procedure. Your health care provider may also give you more specific instructions. If you have problems or questions, contact your health care provider. °What can I expect after the procedure? °After the procedure, it is common to have: °· Bruising that usually fades within 1-2 weeks. °· Tenderness at the site. °Follow these instructions at home: °Wound care °· Follow instructions from your health care provider about how to take care of your insertion site. Make sure you: °? Wash your hands with soap and water before you change your bandage (dressing). If soap and water are not available, use hand sanitizer. °? Change your dressing as told by your health care provider. °? Leave stitches (sutures), skin glue, or adhesive strips in place. These skin closures may need to stay in place for 2 weeks or longer. If adhesive strip edges start to loosen and curl up, you may trim the loose edges. Do not remove adhesive strips completely unless your health care provider tells you to do that. °· Do not take baths, swim, or use a hot tub until your health care provider approves. °· You may shower 24-48 hours after the procedure or as told by your health care provider. °? Gently wash the site with plain soap and water. °? Pat the area dry with a clean towel. °? Do not rub the site. This may cause bleeding. °· Do not apply powder or lotion to the site. Keep the site clean and dry. °· Check your femoral site every day for signs of infection. Check for: °? Redness, swelling, or pain. °? Fluid or blood. °? Warmth. °? Pus or a bad smell. °Activity °· For the first 2-3 days after your procedure, or as long as directed: °? Avoid climbing stairs as much as possible. °? Do not squat. °· Do not lift anything that is heavier than 10 lb (4.5 kg), or the limit that you are told, until your health care provider says that it is safe. °· Rest as  directed. °? Avoid sitting for a long time without moving. Get up to take short walks every 1-2 hours. °· Do not drive for 24 hours if you were given a medicine to help you relax (sedative). °General instructions °· Take over-the-counter and prescription medicines only as told by your health care provider. °· Keep all follow-up visits as told by your health care provider. This is important. °Contact a health care provider if you have: °· A fever or chills. °· You have redness, swelling, or pain around your insertion site. °Get help right away if: °· The catheter insertion area swells very fast. °· You pass out. °· You suddenly start to sweat or your skin gets clammy. °· The catheter insertion area is bleeding, and the bleeding does not stop when you hold steady pressure on the area. °· The area near or just beyond the catheter insertion site becomes pale, cool, tingly, or numb. °These symptoms may represent a serious problem that is an emergency. Do not wait to see if the symptoms will go away. Get medical help right away. Call your local emergency services (911 in the [place] Do not drive yourself to the hospital. °Summary °· After the procedure, it is common to have bruising that usually fades within 1-2 weeks. °· Check your femoral site every day for signs of infection. °· Do not lift anything that is heavier than 10 lb (4.5 kg), or the   limit that you are told, until your health care provider says that it is safe. °This information is not intended to replace advice given to you by your health care provider. Make sure you discuss any questions you have with your health care provider. °Document Released: 10/18/2013 Document Revised: 02/27/2017 Document Reviewed: 02/27/2017 °Elsevier Patient Education © 2020 [employer] Inc. ° °

## 2018-12-17 ENCOUNTER — Encounter (HOSPITAL_COMMUNITY): Payer: Self-pay | Admitting: Vascular Surgery

## 2018-12-21 ENCOUNTER — Other Ambulatory Visit (HOSPITAL_COMMUNITY): Payer: Self-pay | Admitting: Gastroenterology

## 2018-12-21 DIAGNOSIS — R188 Other ascites: Secondary | ICD-10-CM

## 2018-12-21 DIAGNOSIS — K7460 Unspecified cirrhosis of liver: Secondary | ICD-10-CM

## 2018-12-26 ENCOUNTER — Other Ambulatory Visit: Payer: Self-pay

## 2018-12-26 ENCOUNTER — Encounter (HOSPITAL_COMMUNITY): Payer: Self-pay | Admitting: Student

## 2018-12-26 ENCOUNTER — Ambulatory Visit (HOSPITAL_COMMUNITY)
Admission: RE | Admit: 2018-12-26 | Discharge: 2018-12-26 | Disposition: A | Discharge status: Home / Self Care | Payer: Medicare Other | Source: Ambulatory Visit | Attending: Gastroenterology | Admitting: Gastroenterology

## 2018-12-26 DIAGNOSIS — K7460 Unspecified cirrhosis of liver: Secondary | ICD-10-CM | POA: Diagnosis present

## 2018-12-26 DIAGNOSIS — R188 Other ascites: Secondary | ICD-10-CM | POA: Insufficient documentation

## 2018-12-26 HISTORY — PX: IR PARACENTESIS: IMG2679

## 2018-12-26 MED ORDER — LIDOCAINE HCL (PF) 1 % IJ SOLN
INTRAMUSCULAR | Status: DC | PRN
  Administered 2018-12-26: 10 mL

## 2018-12-26 MED ORDER — ALBUMIN HUMAN 25 % IV SOLN
INTRAVENOUS | Status: AC
  Filled 2018-12-26: qty 200

## 2018-12-26 MED ORDER — LIDOCAINE HCL 1 % IJ SOLN
INTRAMUSCULAR | Status: AC
  Filled 2018-12-26: qty 20

## 2018-12-26 NOTE — Progress Notes (Signed)
[place], [place] [address] Phone: [phone] Fax: [phone]      Your procedure is scheduled on Monday, 11/2.  Report to [facility] Main Entrance "A" at 5:30 A.M and check in at the Admitting office.  Call this number if you have problems the morning of surgery:  [phone]  Call [phone] if you have any questions prior to your surgery date Monday-Friday 8am-4pm    Remember:  Do not eat or drink after midnight the night before your surgery - Sunday   Take these medicines the morning of surgery with A SIP OF WATER : carvedilol (COREG)  cloNIDine (CATAPRES) febuxostat (ULORIC)  levothyroxine (SYNTHROID, LEVOTHROID) pantoprazole (PROTONIX)  traMADol (ULTRAM) if needed   STOP now taking any Aspirin (unless otherwise instructed by your surgeon), Aleve, Naproxen, Ibuprofen, Motrin, Advil, Goody's, BC's, all herbal medications, fish oil, and all vitamins.    The Morning of Surgery  Do not wear jewelry, make-up or nail polish.  Do not wear lotions, powders, colognes or deodorant  Do not shave 48 hours prior to surgery.  Men may shave face and neck.  Do not bring valuables to the hospital.  [facility] is not responsible for any belongings or valuables.  If you are a smoker, DO NOT Smoke 24 hours prior to surgery.  IF you wear a CPAP at night please bring your mask, tubing, and machine the morning of surgery.   Remember that you must have someone to transport you home after your surgery, and remain with you for 24 hours if you are discharged the same day.  Patients discharged the day of surgery will not be allowed to drive home.   Contacts, glasses, hearing aids, dentures or bridgework may not be worn into surgery.    Leave your suitcase in the car.  After surgery it may be brought to your room.  For patients admitted to the hospital, discharge time will be determined by your treatment  team.    Special instructions:   [place]- Preparing For Surgery  Before surgery, you can play an important role. Because skin is not sterile, your skin needs to be as free of germs as possible. You can reduce the number of germs on your skin by washing with CHG (chlorahexidine gluconate) Soap before surgery.  CHG is an antiseptic cleaner which kills germs and bonds with the skin to continue killing germs even after washing.    Oral Hygiene is also important to reduce your risk of infection.  Remember - BRUSH YOUR TEETH THE MORNING OF SURGERY WITH YOUR REGULAR TOOTHPASTE  Please do not use if you have an allergy to CHG or antibacterial soaps. If your skin becomes reddened/irritated stop using the CHG.  Do not shave (including legs and underarms) for at least 48 hours prior to first CHG shower. It is OK to shave your face.  Please follow these instructions carefully.   1. Shower the NIGHT BEFORE SURGERY Sun and the [facility] OF SURGERY Mon with CHG Soap.   2. If you chose to wash your hair, wash your hair first as usual with your normal shampoo.  3. After you shampoo, rinse your hair and body thoroughly to remove the shampoo.  4. Use CHG as you would any other liquid soap. You can apply CHG directly to the skin and wash gently with a scrungie or a clean washcloth.   5. Apply the CHG Soap to your body ONLY  FROM THE NECK DOWN.  Do not use on open wounds or open sores. Avoid contact with your eyes, ears, mouth and genitals (private parts). Wash Face and genitals (private parts)  with your normal soap.   6. Wash thoroughly, paying special attention to the area where your surgery will be performed.  7. Thoroughly rinse your body with warm water from the neck down.  8. DO NOT shower/wash with your normal soap after using and rinsing off the CHG Soap.  9. Pat yourself dry with a CLEAN TOWEL.  10. Wear CLEAN PAJAMAS to bed the night before surgery, wear comfortable clothes the morning of  surgery  11. Place CLEAN SHEETS on your bed the night of your first shower and DO NOT SLEEP WITH PETS.    Day of Surgery:  Do not apply any deodorants/lotions. Please shower the morning of surgery with the CHG soap  Please wear clean clothes to the hospital/surgery center.   Remember to brush your teeth WITH YOUR REGULAR TOOTHPASTE.   Please read over the following fact sheets that you were given.

## 2018-12-26 NOTE — Procedures (Signed)
PROCEDURE SUMMARY:  Successful US guided paracentesis from right lateral abdomen.  Yielded 6.5 liters of yellow fluid.  No immediate complications.  Pt tolerated well.   Specimen was not sent for labs.  EBL < 67mL  Docia Barrier PA-C 12/26/2018 9:41 AM

## 2018-12-27 ENCOUNTER — Encounter (HOSPITAL_COMMUNITY): Payer: Self-pay

## 2018-12-27 ENCOUNTER — Other Ambulatory Visit (HOSPITAL_COMMUNITY): Payer: Medicare Other

## 2018-12-27 ENCOUNTER — Other Ambulatory Visit: Payer: Self-pay

## 2018-12-27 ENCOUNTER — Encounter (HOSPITAL_COMMUNITY)
Admission: RE | Admit: 2018-12-27 | Discharge: 2018-12-27 | Disposition: A | Discharge status: Home / Self Care | Payer: Medicare Other | Source: Ambulatory Visit | Attending: Vascular Surgery | Admitting: Vascular Surgery

## 2018-12-27 DIAGNOSIS — R7303 Prediabetes: Secondary | ICD-10-CM | POA: Diagnosis not present

## 2018-12-27 DIAGNOSIS — I2510 Atherosclerotic heart disease of native coronary artery without angina pectoris: Secondary | ICD-10-CM | POA: Insufficient documentation

## 2018-12-27 DIAGNOSIS — I714 Abdominal aortic aneurysm, without rupture: Secondary | ICD-10-CM | POA: Insufficient documentation

## 2018-12-27 DIAGNOSIS — Z7901 Long term (current) use of anticoagulants: Secondary | ICD-10-CM | POA: Insufficient documentation

## 2018-12-27 DIAGNOSIS — Z7982 Long term (current) use of aspirin: Secondary | ICD-10-CM | POA: Diagnosis not present

## 2018-12-27 DIAGNOSIS — N184 Chronic kidney disease, stage 4 (severe): Secondary | ICD-10-CM | POA: Diagnosis not present

## 2018-12-27 DIAGNOSIS — Z79899 Other long term (current) drug therapy: Secondary | ICD-10-CM | POA: Insufficient documentation

## 2018-12-27 DIAGNOSIS — E039 Hypothyroidism, unspecified: Secondary | ICD-10-CM | POA: Insufficient documentation

## 2018-12-27 DIAGNOSIS — Z01812 Encounter for preprocedural laboratory examination: Secondary | ICD-10-CM | POA: Diagnosis present

## 2018-12-27 DIAGNOSIS — E785 Hyperlipidemia, unspecified: Secondary | ICD-10-CM | POA: Insufficient documentation

## 2018-12-27 DIAGNOSIS — K7469 Other cirrhosis of liver: Secondary | ICD-10-CM | POA: Diagnosis not present

## 2018-12-27 DIAGNOSIS — M7062 Trochanteric bursitis, left hip: Secondary | ICD-10-CM | POA: Insufficient documentation

## 2018-12-27 DIAGNOSIS — I129 Hypertensive chronic kidney disease with stage 1 through stage 4 chronic kidney disease, or unspecified chronic kidney disease: Secondary | ICD-10-CM | POA: Diagnosis not present

## 2018-12-27 DIAGNOSIS — E7800 Pure hypercholesterolemia, unspecified: Secondary | ICD-10-CM | POA: Diagnosis not present

## 2018-12-27 LAB — BLOOD GAS, ARTERIAL
Acid-base deficit: 5.5 mmol/L — ABNORMAL HIGH (ref 0.0–2.0)
Bicarbonate: 18.7 mmol/L — ABNORMAL LOW (ref 20.0–28.0)
Drawn by: 421801
FIO2: 0.21
O2 Saturation: 97.4 %
Patient temperature: 37
pCO2 arterial: 32.9 mmHg (ref 32.0–48.0)
pH, Arterial: 7.374 (ref 7.350–7.450)
pO2, Arterial: 129 mmHg — ABNORMAL HIGH (ref 83.0–108.0)

## 2018-12-27 LAB — URINALYSIS, ROUTINE W REFLEX MICROSCOPIC
Bilirubin Urine: NEGATIVE
Glucose, UA: NEGATIVE mg/dL
Hgb urine dipstick: NEGATIVE
Ketones, ur: NEGATIVE mg/dL
Leukocytes,Ua: NEGATIVE
Nitrite: NEGATIVE
Protein, ur: >=300 mg/dL — AB
Specific Gravity, Urine: 1.015 (ref 1.005–1.030)
pH: 5 (ref 5.0–8.0)

## 2018-12-27 LAB — SURGICAL PCR SCREEN
MRSA, PCR: NEGATIVE
Staphylococcus aureus: NEGATIVE

## 2018-12-27 LAB — COMPREHENSIVE METABOLIC PANEL
ALT: 12 U/L (ref 0–44)
AST: 22 U/L (ref 15–41)
Albumin: 2.6 g/dL — ABNORMAL LOW (ref 3.5–5.0)
Alkaline Phosphatase: 112 U/L (ref 38–126)
Anion gap: 8 (ref 5–15)
BUN: 51 mg/dL — ABNORMAL HIGH (ref 8–23)
CO2: 17 mmol/L — ABNORMAL LOW (ref 22–32)
Calcium: 7.7 mg/dL — ABNORMAL LOW (ref 8.9–10.3)
Chloride: 115 mmol/L — ABNORMAL HIGH (ref 98–111)
Creatinine, Ser: 4 mg/dL — ABNORMAL HIGH (ref 0.61–1.24)
GFR calc Af Amer: 16 mL/min — ABNORMAL LOW (ref >60)
GFR calc non Af Amer: 14 mL/min — ABNORMAL LOW (ref >60)
Glucose, Bld: 129 mg/dL — ABNORMAL HIGH (ref 70–99)
Potassium: 5.2 mmol/L — ABNORMAL HIGH (ref 3.5–5.1)
Sodium: 140 mmol/L (ref 135–145)
Total Bilirubin: 0.9 mg/dL (ref 0.3–1.2)
Total Protein: 5.1 g/dL — ABNORMAL LOW (ref 6.5–8.1)

## 2018-12-27 LAB — TYPE AND SCREEN
ABO/RH(D): AB POS
Antibody Screen: NEGATIVE

## 2018-12-27 LAB — APTT: aPTT: 29 seconds (ref 24–36)

## 2018-12-27 LAB — PROTIME-INR
INR: 1.3 — ABNORMAL HIGH (ref 0.8–1.2)
Prothrombin Time: 15.7 seconds — ABNORMAL HIGH (ref 11.4–15.2)

## 2018-12-27 LAB — CBC
HCT: 32.6 % — ABNORMAL LOW (ref 39.0–52.0)
Hemoglobin: 10.2 g/dL — ABNORMAL LOW (ref 13.0–17.0)
MCH: 32.8 pg (ref 26.0–34.0)
MCHC: 31.3 g/dL (ref 30.0–36.0)
MCV: 104.8 fL — ABNORMAL HIGH (ref 80.0–100.0)
Platelets: 79 10*3/uL — ABNORMAL LOW (ref 150–400)
RBC: 3.11 MIL/uL — ABNORMAL LOW (ref 4.22–5.81)
RDW: 15 % (ref 11.5–15.5)
WBC: 4.9 10*3/uL (ref 4.0–10.5)
nRBC: 0 % (ref 0.0–0.2)

## 2018-12-27 NOTE — Progress Notes (Addendum)
Anesthesia Chart Review:  Case: [id number] Date: 12/31/18   Procedures:      ABDOMINAL AORTIC ENDOVASCULAR STENT GRAFT (N/A )     INTRAVASCULAR ULTRASOUND (N/A )   Anesthesia type: General   Pre-op diagnosis: abdominal aortic aneurysm   Location: [facility] OR   Surgeon: Angelia Mould, MD      DISCUSSION: Patient is a 78 year old male who is scheduled for the above procedure.  PAT visit was on 12/27/2018 as surgery currently scheduled for 12/31/2018; however, patient still has pending cardiac testing, so surgery to be tententatively moved to 01/21/2019.   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   Discussed 12/27/18 labs with [facility] RN Zigmund Daniel includes K 5.2, Creatinine 4.00 (previously 3.15-3.70 09/11/18-12/14/18), and PLT count 79K (known thrombocytopenia, 62-98K 05/18/16-06/18/18). Dr. Scot Dock is aware. Reportedly he has already discussed surgery plans with patient's nephrologist Dr. Justin Mend. CO2 and IVIS will be used for this procedure given his CKD. He will be considered for AVF creation in the near future, but at this point is not on hemodialysis. Nephrology records requested.    VS: BP 125/65   Pulse (!) 57   Temp 36.4 C   Resp 18   Ht 6' (1.829 m)   Wt 82.8 kg   SpO2 100%   BMI 24.75 kg/m   PROVIDERS: [name]., MD is PCP  Gwenith Spitz, MD is cardiologist. He was seen on 12/17/18 by Talbert Cage, NP. He is scheduled for an echo, scheduled for 01/14/19. Edrick Oh, MD is nephrologist Otis Brace, MD  is GI [facility]). Seen at [facility] 09/20/17, but not felt to be a good candidate for liver transplantation at that time given CKD.    LABS: See DISCUSSION. (all labs ordered are listed, but only abnormal results are displayed)  Labs Reviewed  BLOOD GAS, ARTERIAL - Abnormal; Notable for the following components:      Result Value   pO2, Arterial 129 (*)    Bicarbonate 18.7 (*)    Acid-base deficit 5.5 (*)    All other components within normal limits  CBC - Abnormal; Notable for the following components:   RBC 3.11 (*)    Hemoglobin 10.2 (*)    HCT 32.6 (*)    MCV 104.8 (*)    Platelets 79 (*)    All other components within normal limits  COMPREHENSIVE METABOLIC PANEL - Abnormal; Notable for the following components:   Potassium 5.2 (*)    Chloride 115 (*)    CO2 17 (*)    Glucose, Bld 129 (*)    BUN 51 (*)    Creatinine, Ser 4.00 (*)    Calcium 7.7 (*)    Total Protein 5.1 (*)    Albumin 2.6 (*)    GFR calc non Af Amer 14 (*)    GFR calc Af Amer 16 (*)    All other components within normal limits  PROTIME-INR - Abnormal; Notable for the following components:   Prothrombin Time 15.7 (*)    INR 1.3 (*)    All other components within normal limits  URINALYSIS, ROUTINE W REFLEX MICROSCOPIC - Abnormal; Notable for the following components:  Protein, ur >=300 (*)    Bacteria, UA RARE (*)    All other components within normal limits  SURGICAL PCR SCREEN  APTT  TYPE AND SCREEN     IMAGES: CT abd/pelvis 11/19/18: IMPRESSION: 1. Continued increased size of infrarenal abdominal aortic aneurysm, currently measuring 5.5 cm in maximal diameter, previously, 5.1 cm when compared to the 03/2018 examination and 4.3 cm when compared to the 04/2017 examination. This finding is associated with development of high density mural thrombus within the dominant component of the aneurysm, which is suboptimally evaluated on this noncontrast examination, though could be seen in the  setting of acute intramural hematoma. No definitive periaortic stranding though evaluation degraded secondary to presence of moderate volume intra-abdominal ascites. Aortic aneurysm NOS (ICD10-I71.9). 2. Coronary artery calcifications. Aortic Atherosclerosis (ICD10-I70.0). 3. Similar findings of cirrhosis and moderate volume intra-abdominal ascites. 4. Interval development of small pericardial effusion. Further evaluation cardiac echo could be performed as indicated. 5. Suspected anemia. 6. Colonic diverticulosis without evidence superimposed acute diverticulitis on this noncontrast examination. 7. Post prostatectomy.  CT chest/abd/pelvis 04/25/18: IMPRESSION: 1. Acute fractures of the left seventh, eighth, ninth, and tenth ribs (with the ninth and tenth rib fractures being mildly displaced). Suspected fracture of the right lateral eighth rib. 2. Nonobstructive right nephrolithiasis. 3. Moderate to large amount of ascites, although this is been present on prior exams and is probably related to the patient's cirrhosis. 4. Mildly elevated left hemidiaphragm.  Bibasilar mild atelectasis. 5. Aortic Atherosclerosis (ICD10-I70.0). Aberrant right subclavian artery. 6. Infrarenal abdominal aortic aneurysm, 5.3 cm in diameter, increased from prior 05/16/2017 exam where it measured 4.8 cm. Recommend followup by abdomen and pelvis CTA in 3-6 months, and vascular surgery referral/consultation if not already obtained. This recommendation follows ACR consensus guidelines: White Paper of the ACR Incidental Findings Committee II on Vascular Findings. J Am Coll Radiol 2013; 10:789-794. 7. Prostatectomy with penile implant.   EKG: 12/14/18: Sinus bradycardia Low voltage QRS Septal infarct , age undetermined Abnormal ECG Since last tracing rate slower Confirmed by Quay Burow [id number]) on 12/14/2018 12:49:23 PM   CV: Echo 01/14/19: PENDING.   Echo 03/30/17 (Dr. Gilman Schmidt office; scanned  under Media tab; Correspondence 10/29/18):   1.  Technically adequate study. 2.  LV cavity normal in size.  Mild concentric LVH.  Normal global wall motion.  Calculated EF 60%. 2.  LA cavity mildly dilated. 3.  Trileaflet aortic valve with no regurgitation noted.  Moderate sclerosis of the aortic valve. Mildly restricted aortic valve leaflets, no significant gradient recorded. 4.  Structurally normal mitral valve with trace regurgitation. 5.  Mildly dilated ascending aorta measuring 4.0 cm   Nuclear stress test 03/06/17 (Dr. Gilman Schmidt office):  - Uneventful lexiscan injection. Normal SPECT perfusion imaging.  No significant ischemia detected.  No evidence of infarct.  Clinically negative stress test with no chest pain.  Normal LV systolic function.  Normal BP response to stress.      Past Medical History:  Diagnosis Date  . AAA (abdominal aortic aneurysm) ([place])    5.3cm , magd by vascular Dr Scot Dock ,  . Aortic stenosis   . Arthritis    knees  . Ascites   . Cancer [facility])    prostate  . Chronic kidney disease    stage 4, mgd by Dr Justin Mend   . 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([place])   . Coronary artery disease    on CT scan  . Gout   . H/O hypercholesterolemia   .  History of falling    recent- see ct chest results of 04/25/2018  . Hypertension   . Hypothyroidism   . Liver cirrhosis ([place])   . Prediabetes    pt denies   . Renal artery stenosis ([place])   . Thrombocytopenia ([place])   . Wears glasses     Past Surgical History:  Procedure Laterality Date  . ABDOMINAL AORTOGRAM W/LOWER EXTREMITY Bilateral 12/14/2018   Procedure: ABDOMINAL AORTOGRAM W/LOWER EXTREMITY;  Surgeon: Angelia Mould, MD;  Location: [facility];  Service: Cardiovascular;  Laterality: Bilateral;  . CATARACT EXTRACTION W/ INTRAOCULAR LENS  IMPLANT, BILATERAL    . CHOLECYSTECTOMY    . ESOPHAGEAL BANDING N/A 08/11/2017   Procedure: ESOPHAGEAL BANDING;  Surgeon: Otis Brace, MD;  Location: [facility];  Service: Gastroenterology;  Laterality: N/A;  . ESOPHAGEAL BANDING N/A 10/26/2017   Procedure: ESOPHAGEAL BANDING;  Surgeon: Otis Brace, MD;  Location: [facility];  Service: Gastroenterology;  Laterality: N/A;  . ESOPHAGEAL BANDING N/A 12/19/2017   Procedure: ESOPHAGEAL BANDING;  Surgeon: Otis Brace, MD;  Location: [facility];  Service: Gastroenterology;  Laterality: N/A;  . ESOPHAGOGASTRODUODENOSCOPY (EGD) WITH PROPOFOL N/A 07/05/2017   Procedure: ESOPHAGOGASTRODUODENOSCOPY (EGD) WITH PROPOFOL;  Surgeon: Otis Brace, MD;  Location: [facility];  Service: Gastroenterology;  Laterality: N/A;  . ESOPHAGOGASTRODUODENOSCOPY (EGD) WITH PROPOFOL N/A 08/11/2017   Procedure: ESOPHAGOGASTRODUODENOSCOPY (EGD) WITH PROPOFOL;  Surgeon: Otis Brace, MD;  [facility]: [facility];  Service: Gastroenterology;  Laterality: N/A;  . ESOPHAGOGASTRODUODENOSCOPY (EGD) WITH PROPOFOL N/A 10/26/2017   Procedure: ESOPHAGOGASTRODUODENOSCOPY (EGD) WITH PROPOFOL;  Surgeon: Otis Brace, MD;  [facility]: [facility];  Service: Gastroenterology;  Laterality: N/A;  . ESOPHAGOGASTRODUODENOSCOPY (EGD) WITH PROPOFOL N/A 12/19/2017   Procedure: ESOPHAGOGASTRODUODENOSCOPY (EGD) WITH PROPOFOL;  Surgeon: Otis Brace, MD;  [facility]: [facility];  Service: Gastroenterology;  Laterality: N/A;  . ESOPHAGOGASTRODUODENOSCOPY (EGD) WITH PROPOFOL N/A 10/29/2018   Procedure: ESOPHAGOGASTRODUODENOSCOPY (EGD) WITH PROPOFOL;  Surgeon: Otis Brace, MD;  Location: [facility];  Service: Gastroenterology;  Laterality: N/A;  . HERNIA REPAIR    . IR PARACENTESIS  12/26/2018  . PROSTATECTOMY    . subtotal gastrectomy      MEDICATIONS: . aspirin EC 81 MG tablet  . atorvastatin (LIPITOR) 10 MG tablet  . carvedilol (COREG) 12.5 MG tablet  . cloNIDine (CATAPRES) 0.2 MG tablet  . colchicine 0.6 MG tablet  . febuxostat (ULORIC) 40 MG tablet  . levothyroxine (SYNTHROID, LEVOTHROID) 25 MCG tablet   . Multiple Vitamins-Minerals (MULTIVITAMIN GUMMIES ADULT) CHEW  . pantoprazole (PROTONIX) 40 MG tablet  . sodium bicarbonate 650 MG tablet  . traMADol (ULTRAM) 50 MG tablet   No current facility-administered medications for this encounter.     Myra Gianotti, PA-C Surgical Short Stay/Anesthesiology [facility] Phone [phone] [facility] Phone [phone] 12/27/2018 5:24 PM

## 2018-12-27 NOTE — Progress Notes (Signed)
PCP:  Gilford Rile MD Cardiologist:  Dr. Atilano Median  EKG:  12/17/18 CXR:  N/A ECHO:  12/18/18 Stress Test:  Pt thinks he had stress test 5 years ago Cardiac Cath:  denies  Covid testing:  Scheduled for 12/27/18, but due to site being shut down today will go for testing tomorrow 12/28/18  Patient denies shortness of breath, fever, cough, and chest pain at PAT appointment.  Patient verbalized understanding of instructions provided today at the PAT appointment.  Patient asked to review instructions at home and day of surgery.

## 2018-12-28 ENCOUNTER — Inpatient Hospital Stay (HOSPITAL_COMMUNITY): Admission: RE | Admit: 2018-12-28 | Payer: Medicare Other | Source: Ambulatory Visit

## 2019-01-01 ENCOUNTER — Encounter (HOSPITAL_COMMUNITY): Payer: Medicare Other

## 2019-01-02 ENCOUNTER — Other Ambulatory Visit (HOSPITAL_COMMUNITY): Payer: Self-pay | Admitting: Gastroenterology

## 2019-01-02 ENCOUNTER — Encounter (HOSPITAL_COMMUNITY): Payer: Self-pay | Admitting: Student

## 2019-01-02 ENCOUNTER — Ambulatory Visit (HOSPITAL_COMMUNITY)
Admission: RE | Admit: 2019-01-02 | Discharge: 2019-01-02 | Disposition: A | Discharge status: Home / Self Care | Payer: Medicare Other | Source: Ambulatory Visit | Attending: Gastroenterology | Admitting: Gastroenterology

## 2019-01-02 ENCOUNTER — Other Ambulatory Visit: Payer: Self-pay | Admitting: Gastroenterology

## 2019-01-02 ENCOUNTER — Other Ambulatory Visit: Payer: Self-pay

## 2019-01-02 DIAGNOSIS — K7460 Unspecified cirrhosis of liver: Secondary | ICD-10-CM

## 2019-01-02 DIAGNOSIS — R188 Other ascites: Secondary | ICD-10-CM

## 2019-01-02 HISTORY — PX: IR PARACENTESIS: IMG2679

## 2019-01-02 MED ORDER — ALBUMIN HUMAN 25 % IV SOLN
50.0000 g | Freq: Once | INTRAVENOUS | Status: AC
  Administered 2019-01-02: 50 g via INTRAVENOUS

## 2019-01-02 MED ORDER — LIDOCAINE HCL 1 % IJ SOLN
INTRAMUSCULAR | Status: DC | PRN
  Administered 2019-01-02: 10 mL

## 2019-01-02 MED ORDER — ALBUMIN HUMAN 25 % IV SOLN
INTRAVENOUS | Status: AC
  Filled 2019-01-02: qty 200

## 2019-01-02 MED ORDER — LIDOCAINE HCL 1 % IJ SOLN
INTRAMUSCULAR | Status: AC
  Filled 2019-01-02: qty 20

## 2019-01-02 NOTE — Procedures (Signed)
PROCEDURE SUMMARY:  Successful US guided paracentesis from right lateral abdomen.  Yielded 5.4 liters of yellow fluid.  No immediate complications.  Pt tolerated well.   Specimen was not sent for labs.  EBL < 11mL  Docia Barrier PA-C 01/02/2019 2:13 PM

## 2019-01-07 ENCOUNTER — Other Ambulatory Visit (HOSPITAL_COMMUNITY): Payer: Self-pay | Admitting: *Deleted

## 2019-01-08 ENCOUNTER — Other Ambulatory Visit: Payer: Self-pay

## 2019-01-08 ENCOUNTER — Ambulatory Visit (HOSPITAL_COMMUNITY)
Admission: RE | Admit: 2019-01-08 | Discharge: 2019-01-08 | Disposition: A | Discharge status: Home / Self Care | Payer: Medicare Other | Source: Ambulatory Visit | Attending: Nephrology | Admitting: Nephrology

## 2019-01-08 VITALS — BP 172/75 | HR 64 | Temp 96.7°F | Resp 18

## 2019-01-08 DIAGNOSIS — N183 Chronic kidney disease, stage 3 unspecified: Secondary | ICD-10-CM | POA: Diagnosis present

## 2019-01-08 DIAGNOSIS — D631 Anemia in chronic kidney disease: Secondary | ICD-10-CM | POA: Diagnosis not present

## 2019-01-08 LAB — POCT HEMOGLOBIN-HEMACUE: Hemoglobin: 10.1 g/dL — ABNORMAL LOW (ref 13.0–17.0)

## 2019-01-08 LAB — IRON AND TIBC
Iron: 84 ug/dL (ref 45–182)
Saturation Ratios: 40 % — ABNORMAL HIGH (ref 17.9–39.5)
TIBC: 210 ug/dL — ABNORMAL LOW (ref 250–450)
UIBC: 126 ug/dL

## 2019-01-08 LAB — FERRITIN: Ferritin: 125 ng/mL (ref 24–336)

## 2019-01-08 MED ORDER — DARBEPOETIN ALFA 100 MCG/0.5ML IJ SOSY
150.0000 ug | PREFILLED_SYRINGE | INTRAMUSCULAR | Status: DC
  Administered 2019-01-08: 11:00:00 150 ug via SUBCUTANEOUS

## 2019-01-08 MED ORDER — DARBEPOETIN ALFA 150 MCG/0.3ML IJ SOSY
PREFILLED_SYRINGE | INTRAMUSCULAR | Status: AC
  Filled 2019-01-08: qty 0.3

## 2019-01-09 MED FILL — Darbepoetin Alfa Soln Prefilled Syringe 150 MCG/0.3ML: INTRAMUSCULAR | Qty: 0.3 | Status: AC

## 2019-01-10 ENCOUNTER — Other Ambulatory Visit (HOSPITAL_COMMUNITY): Payer: Self-pay | Admitting: Gastroenterology

## 2019-01-10 ENCOUNTER — Other Ambulatory Visit: Payer: Self-pay | Admitting: Gastroenterology

## 2019-01-10 DIAGNOSIS — K7460 Unspecified cirrhosis of liver: Secondary | ICD-10-CM

## 2019-01-10 DIAGNOSIS — R188 Other ascites: Secondary | ICD-10-CM

## 2019-01-15 ENCOUNTER — Encounter (HOSPITAL_COMMUNITY): Payer: Self-pay | Admitting: Physician Assistant

## 2019-01-15 ENCOUNTER — Other Ambulatory Visit: Payer: Self-pay

## 2019-01-15 ENCOUNTER — Ambulatory Visit (HOSPITAL_COMMUNITY)
Admission: RE | Admit: 2019-01-15 | Discharge: 2019-01-15 | Disposition: A | Discharge status: Home / Self Care | Payer: Medicare Other | Source: Ambulatory Visit | Attending: Gastroenterology | Admitting: Gastroenterology

## 2019-01-15 DIAGNOSIS — R188 Other ascites: Secondary | ICD-10-CM | POA: Insufficient documentation

## 2019-01-15 DIAGNOSIS — K7460 Unspecified cirrhosis of liver: Secondary | ICD-10-CM | POA: Insufficient documentation

## 2019-01-15 HISTORY — PX: IR PARACENTESIS: IMG2679

## 2019-01-15 MED ORDER — ALBUMIN HUMAN 25 % IV SOLN
50.0000 g | Freq: Once | INTRAVENOUS | Status: AC
  Administered 2019-01-15: 10:00:00 50 g via INTRAVENOUS

## 2019-01-15 MED ORDER — LIDOCAINE HCL (PF) 1 % IJ SOLN
INTRAMUSCULAR | Status: DC | PRN
  Administered 2019-01-15: 5 mL

## 2019-01-15 MED ORDER — ALBUMIN HUMAN 25 % IV SOLN
INTRAVENOUS | Status: AC
  Administered 2019-01-15: 50 g via INTRAVENOUS
  Filled 2019-01-15: qty 200

## 2019-01-15 MED ORDER — LIDOCAINE HCL 1 % IJ SOLN
INTRAMUSCULAR | Status: AC
  Filled 2019-01-15: qty 20

## 2019-01-15 NOTE — Procedures (Signed)
Ultrasound-guided therapeutic paracentesis performed yielding 5.3 liters of straw colored fluid. EBL is none.

## 2019-01-16 ENCOUNTER — Other Ambulatory Visit: Payer: Self-pay | Admitting: *Deleted

## 2019-01-17 NOTE — Pre-Procedure Instructions (Signed)
[place], [place] [address] Phone: [phone] Fax: [phone]      Your procedure is scheduled on 01-21-19  Report to [facility] Main Entrance "A" at 0530A.M., and check in at the Admitting office.  Call this number if you have problems the morning of surgery:  [phone]  Call [phone] if you have any questions prior to your surgery date Monday-Friday 8am-4pm    Remember:  Do not eat or drink after midnight the night before your surgery  Take these medicines the morning of surgery with A SIP OF WATER : carvedilol (COREG)   cloNIDine (CATAPRES)  colchicine febuxostat (ULORIC)  levothyroxine (SYNTHROID, LEVOTHROID) traMADol (ULTRAM)as needed pantoprazole (PROTONIX)  7 days prior to surgery STOP taking any Aspirin (unless otherwise instructed by your surgeon), Aleve, Naproxen, Ibuprofen, Motrin, Advil, Goody's, BC's, all herbal medications, fish oil, and all vitamins.    The Morning of Surgery  Do not wear jewelry, make-up or nail polish.  Do not wear lotions, powders, or perfumes/colognes, or deodorant  .  Men may shave face and neck.  Do not bring valuables to the hospital.  [facility] is not responsible for any belongings or valuables.  If you are a smoker, DO NOT Smoke 24 hours prior to surgery  If you wear a CPAP at night please bring your mask, tubing, and machine the morning of surgery   Remember that you must have someone to transport you home after your surgery, and remain with you for 24 hours if you are discharged the same day.   Please bring cases for contacts, glasses, hearing aids, dentures or bridgework because it cannot be worn into surgery.    Leave your suitcase in the car.  After surgery it may be brought to your room.  For patients admitted to the hospital, discharge time will be determined by your treatment team.  Patients discharged the day of surgery will not be  allowed to drive home.    Special instructions:   [place]- Preparing For Surgery  Before surgery, you can play an important role. Because skin is not sterile, your skin needs to be as free of germs as possible. You can reduce the number of germs on your skin by washing with CHG (chlorahexidine gluconate) Soap before surgery.  CHG is an antiseptic cleaner which kills germs and bonds with the skin to continue killing germs even after washing.    Oral Hygiene is also important to reduce your risk of infection.  Remember - BRUSH YOUR TEETH THE MORNING OF SURGERY WITH YOUR REGULAR TOOTHPASTE  Please do not use if you have an allergy to CHG or antibacterial soaps. If your skin becomes reddened/irritated stop using the CHG.  Do not shave (including legs and underarms) for at least 48 hours prior to first CHG shower. It is OK to shave your face.  Please follow these instructions carefully.   1. Shower the NIGHT BEFORE SURGERY and the MORNING OF SURGERY with CHG Soap.   2. If you chose to wash your hair, wash your hair first as usual with your normal shampoo.  3. After you shampoo, rinse your hair and body thoroughly to remove the shampoo.  4. Use CHG as you would any other liquid soap. You can apply CHG directly to the skin and wash gently with a scrungie or a clean washcloth.   5. Apply the CHG Soap to your body ONLY FROM THE NECK  DOWN.  Do not use on open wounds or open sores. Avoid contact with your eyes, ears, mouth and genitals (private parts). Wash Face and genitals (private parts)  with your normal soap.   6. Wash thoroughly, paying special attention to the area where your surgery will be performed.  7. Thoroughly rinse your body with warm water from the neck down.  8. DO NOT shower/wash with your normal soap after using and rinsing off the CHG Soap.  9. Pat yourself dry with a CLEAN TOWEL.  10. Wear CLEAN PAJAMAS to bed the night before surgery, wear comfortable clothes the  morning of surgery  11. Place CLEAN SHEETS on your bed the night of your first shower and DO NOT SLEEP WITH PETS.  Day of Surgery:  Please shower the morning of surgery with the CHG soap Do not apply any deodorants/lotions. Please wear clean clothes to the hospital/surgery center.   Remember to brush your teeth WITH YOUR REGULAR TOOTHPASTE.   Please read over the  fact sheets that you were given.

## 2019-01-18 ENCOUNTER — Telehealth: Payer: Self-pay | Admitting: *Deleted

## 2019-01-18 ENCOUNTER — Other Ambulatory Visit: Payer: Self-pay

## 2019-01-18 ENCOUNTER — Encounter (HOSPITAL_COMMUNITY)
Admission: RE | Admit: 2019-01-18 | Discharge: 2019-01-18 | Disposition: A | Discharge status: Home / Self Care | Payer: Medicare Other | Source: Ambulatory Visit | Attending: Vascular Surgery | Admitting: Vascular Surgery

## 2019-01-18 ENCOUNTER — Other Ambulatory Visit: Payer: Self-pay | Admitting: *Deleted

## 2019-01-18 ENCOUNTER — Inpatient Hospital Stay (HOSPITAL_COMMUNITY): Admission: RE | Admit: 2019-01-18 | Payer: Medicare Other | Source: Ambulatory Visit

## 2019-01-18 ENCOUNTER — Encounter (HOSPITAL_COMMUNITY): Payer: Self-pay

## 2019-01-18 DIAGNOSIS — Z01812 Encounter for preprocedural laboratory examination: Secondary | ICD-10-CM | POA: Insufficient documentation

## 2019-01-18 DIAGNOSIS — I350 Nonrheumatic aortic (valve) stenosis: Secondary | ICD-10-CM | POA: Insufficient documentation

## 2019-01-18 DIAGNOSIS — I700 Atherosclerosis of aorta: Secondary | ICD-10-CM | POA: Insufficient documentation

## 2019-01-18 DIAGNOSIS — N189 Chronic kidney disease, unspecified: Secondary | ICD-10-CM | POA: Insufficient documentation

## 2019-01-18 DIAGNOSIS — M1990 Unspecified osteoarthritis, unspecified site: Secondary | ICD-10-CM | POA: Insufficient documentation

## 2019-01-18 DIAGNOSIS — E039 Hypothyroidism, unspecified: Secondary | ICD-10-CM | POA: Insufficient documentation

## 2019-01-18 DIAGNOSIS — I313 Pericardial effusion (noninflammatory): Secondary | ICD-10-CM | POA: Insufficient documentation

## 2019-01-18 DIAGNOSIS — I2510 Atherosclerotic heart disease of native coronary artery without angina pectoris: Secondary | ICD-10-CM | POA: Insufficient documentation

## 2019-01-18 DIAGNOSIS — K5730 Diverticulosis of large intestine without perforation or abscess without bleeding: Secondary | ICD-10-CM | POA: Insufficient documentation

## 2019-01-18 DIAGNOSIS — R188 Other ascites: Secondary | ICD-10-CM | POA: Insufficient documentation

## 2019-01-18 DIAGNOSIS — I714 Abdominal aortic aneurysm, without rupture: Secondary | ICD-10-CM | POA: Insufficient documentation

## 2019-01-18 DIAGNOSIS — I129 Hypertensive chronic kidney disease with stage 1 through stage 4 chronic kidney disease, or unspecified chronic kidney disease: Secondary | ICD-10-CM | POA: Insufficient documentation

## 2019-01-18 DIAGNOSIS — E7800 Pure hypercholesterolemia, unspecified: Secondary | ICD-10-CM | POA: Insufficient documentation

## 2019-01-18 DIAGNOSIS — K7460 Unspecified cirrhosis of liver: Secondary | ICD-10-CM | POA: Insufficient documentation

## 2019-01-18 DIAGNOSIS — Z79899 Other long term (current) drug therapy: Secondary | ICD-10-CM | POA: Insufficient documentation

## 2019-01-18 DIAGNOSIS — N390 Urinary tract infection, site not specified: Secondary | ICD-10-CM

## 2019-01-18 DIAGNOSIS — M109 Gout, unspecified: Secondary | ICD-10-CM | POA: Insufficient documentation

## 2019-01-18 DIAGNOSIS — R319 Hematuria, unspecified: Secondary | ICD-10-CM

## 2019-01-18 HISTORY — DX: Cardiac murmur, unspecified: R01.1

## 2019-01-18 LAB — COMPREHENSIVE METABOLIC PANEL
ALT: 19 U/L (ref 0–44)
AST: 22 U/L (ref 15–41)
Albumin: 2.9 g/dL — ABNORMAL LOW (ref 3.5–5.0)
Alkaline Phosphatase: 135 U/L — ABNORMAL HIGH (ref 38–126)
Anion gap: 10 (ref 5–15)
BUN: 66 mg/dL — ABNORMAL HIGH (ref 8–23)
CO2: 17 mmol/L — ABNORMAL LOW (ref 22–32)
Calcium: 8 mg/dL — ABNORMAL LOW (ref 8.9–10.3)
Chloride: 112 mmol/L — ABNORMAL HIGH (ref 98–111)
Creatinine, Ser: 3.96 mg/dL — ABNORMAL HIGH (ref 0.61–1.24)
GFR calc Af Amer: 16 mL/min — ABNORMAL LOW (ref >60)
GFR calc non Af Amer: 14 mL/min — ABNORMAL LOW (ref >60)
Glucose, Bld: 151 mg/dL — ABNORMAL HIGH (ref 70–99)
Potassium: 5.1 mmol/L (ref 3.5–5.1)
Sodium: 139 mmol/L (ref 135–145)
Total Bilirubin: 1.2 mg/dL (ref 0.3–1.2)
Total Protein: 5.3 g/dL — ABNORMAL LOW (ref 6.5–8.1)

## 2019-01-18 LAB — CBC
HCT: 32.3 % — ABNORMAL LOW (ref 39.0–52.0)
Hemoglobin: 10.2 g/dL — ABNORMAL LOW (ref 13.0–17.0)
MCH: 33.6 pg (ref 26.0–34.0)
MCHC: 31.6 g/dL (ref 30.0–36.0)
MCV: 106.3 fL — ABNORMAL HIGH (ref 80.0–100.0)
Platelets: 84 10*3/uL — ABNORMAL LOW (ref 150–400)
RBC: 3.04 MIL/uL — ABNORMAL LOW (ref 4.22–5.81)
RDW: 15.9 % — ABNORMAL HIGH (ref 11.5–15.5)
WBC: 3.8 10*3/uL — ABNORMAL LOW (ref 4.0–10.5)
nRBC: 0 % (ref 0.0–0.2)

## 2019-01-18 LAB — URINALYSIS, ROUTINE W REFLEX MICROSCOPIC
Bilirubin Urine: NEGATIVE
Glucose, UA: NEGATIVE mg/dL
Hgb urine dipstick: NEGATIVE
Ketones, ur: NEGATIVE mg/dL
Leukocytes,Ua: NEGATIVE
Nitrite: NEGATIVE
Protein, ur: 100 mg/dL — AB
Specific Gravity, Urine: 1.017 (ref 1.005–1.030)
pH: 5 (ref 5.0–8.0)

## 2019-01-18 LAB — SURGICAL PCR SCREEN
MRSA, PCR: NEGATIVE
Staphylococcus aureus: NEGATIVE

## 2019-01-18 LAB — APTT: aPTT: 31 seconds (ref 24–36)

## 2019-01-18 LAB — PROTIME-INR
INR: 1.2 (ref 0.8–1.2)
Prothrombin Time: 15.4 seconds — ABNORMAL HIGH (ref 11.4–15.2)

## 2019-01-18 MED ORDER — CIPROFLOXACIN HCL 500 MG PO TABS: 500.0000 mg | ORAL_TABLET | Freq: Two times a day (BID) | ORAL | 0 refills | Status: DC

## 2019-01-18 NOTE — Progress Notes (Addendum)
Anesthesia Chart Review:  Case: [id number] Date/Time: 01/21/19 0715   Procedures:      ABDOMINAL AORTIC ENDOVASCULAR STENT GRAFT WITH CO2 (N/A )     INTRAVASCULAR ULTRASOUND (N/A )   Anesthesia type: General   Pre-op diagnosis: abdominal aortic aneurysm   Location: [facility] OR ROOM 16 / [place] [facility]   Surgeon: Angelia Mould, MD      DISCUSSION: DISCUSSION: Patient is a 78 year old male who is scheduled for the above procedure.  Surgery was initially scheduled for 12/31/18, but postponed due to pending cardiology testing. He has since had an echocardiogram showing normal LVEF, aortic sclerosis without AS, and 43 mm ascending aorta. "He may proceed with proposed surgery" per Talbert Cage, NP with [facility].   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   Nephrologist is Dr. Justin Mend. Last visit 12/18/18. Labs there on 12/03/18 showed BUN 50, Cr 3.28, eGRF 17, K 5.3. He was aware of surgical plans. He discussed with patient that this procedure will likely be complicated by requiring hemodialysis. He does not yet have an AVF. Patient had gone to a kidney options class, and should he require dialysis the plan is for center hemodialysis. Dr. Justin Mend was going to speak with Dr. Scot Dock. Per [facility] RN Zigmund Daniel, Dr. Scot Dock did speak with Dr. Justin Mend with plans to proceed with EVAR but will use CO2 and IVIS to minimize contrast. Ricky Harris will be considered for AVF creation in the near future.   Last had paracentesis  on 01/02/19 (5.4L) and 01/15/19 (5.3 L).   Preoperative labs show K 5.1, glucose 151, Cr 3.96 (previously 3.15-3.70 09/11/18-12/14/18; Cr 4.00 12/27/18), and PLT count 84K (known thrombocytopenia, 62-98K 05/18/16-06/18/18). PT 15.4, INR 1.2, PTT 31. Dr. Scot Dock has reviewed labs. He is prescribing antibiotic for rare bacteria in UA and requests repeat platelet count prior to surgery. STAT CBC ordered.  It doesn't look like he went to his 01/18/19 COVID-19 presurgical testing appointment, so our nursing staff will follow-up with him. Anesthesia team to evaluate prior to surgery.    VS: BP (!) 141/63   Pulse 61   Temp (!) 36.4 C (Oral)   Resp 18   Ht 6' (1.829 m)   Wt 79.1 kg   SpO2 100%   BMI 23.64 kg/m     PROVIDERS: [name]., MD is PCP  - Gwenith Spitz, MD is cardiologist. He was seen on 12/17/18 by Talbert Cage, NP. (See [place]) - Edrick Oh, MD is nephrologist - Otis Brace, MD is GI [facility]). Seen at [facility] 09/20/17, but not felt to be a good candidate for liver transplantation at that time given CKD.    LABS: Preoperative labs reviewed. See DISCUSSION. (all labs ordered are listed, but only abnormal results are displayed)  Labs Reviewed  COMPREHENSIVE METABOLIC PANEL - Abnormal; Notable for the following components:      Result Value   Chloride 112 (*)    CO2 17 (*)    Glucose, Bld 151 (*)    BUN 66 (*)    Creatinine, Ser 3.96 (*)    Calcium 8.0 (*)  Total Protein 5.3 (*)    Albumin 2.9 (*)    Alkaline Phosphatase 135 (*)    GFR calc non Af Amer 14 (*)    GFR calc Af Amer 16 (*)    All other components within normal limits  CBC - Abnormal; Notable for the following components:   WBC 3.8 (*)    RBC 3.04 (*)    Hemoglobin 10.2 (*)    HCT 32.3 (*)    MCV 106.3 (*)    RDW 15.9 (*)    Platelets 84 (*)    All other components within normal limits  PROTIME-INR - Abnormal; Notable for the following components:    Prothrombin Time 15.4 (*)    All other components within normal limits  URINALYSIS, ROUTINE W REFLEX MICROSCOPIC - Abnormal; Notable for the following components:   Protein, ur 100 (*)    Bacteria, UA RARE (*)    All other components within normal limits  SURGICAL PCR SCREEN  APTT  TYPE AND SCREEN     IMAGES: CT abd/pelvis 11/19/18: IMPRESSION: 1. Continued increased size of infrarenal abdominal aortic aneurysm, currently measuring 5.5 cm in maximal diameter, previously, 5.1 cm when compared to the 03/2018 examination and 4.3 cm when compared to the 04/2017 examination. This finding is associated with development of high density mural thrombus within the dominant component of the aneurysm, which is suboptimally evaluated on this noncontrast examination, though could be seen in the setting of acute intramural hematoma. No definitive periaortic stranding though evaluation degraded secondary to presence of moderate volume intra-abdominal ascites. Aortic aneurysm NOS (ICD10-I71.9). 2. Coronary artery calcifications. Aortic Atherosclerosis (ICD10-I70.0). 3. Similar findings of cirrhosis and moderate volume intra-abdominal ascites. 4. Interval development of small pericardial effusion. Further evaluation cardiac echo could be performed as indicated. 5. Suspected anemia. 6. Colonic diverticulosis without evidence superimposed acute diverticulitis on this noncontrast examination. 7. Post prostatectomy.   EKG: 12/14/18: Sinus bradycardia at 53 bpm Low voltage QRS Septal infarct , age undetermined Abnormal ECG Since last tracing rate slower Confirmed by Quay Burow [phone]) on 12/14/2018 12:49:23 PM   CV: Echo 01/14/19 [facility]):  Summary  There is no prior echocardiogram for comparison.  The aortic valve appears to be trileaflet.  There is moderate aortic sclerosis noted, with no evidence of stenosis.  No aortic regurgitation.  Ejection fraction is visually  estimated at 60-65%.  Normal left ventricular size and systolic function with no appreciable  segmental abnormality.  Diastolic function is indeterminate.  Sigmoid deformity of the ventricular septum  The aortic root diameter is within normal limits.  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  The IVC is normal.  Ascites noted.  (Comparison Echo 03/30/17, Media tab; Correspondence 10/29/18: LVEF 60%, 4.0 cm ascending aorta)   Nuclear stress test 03/06/17(Dr. Cheek's office):  - Uneventful lexiscan injection. Normal SPECT perfusion imaging. No significant ischemia detected. No evidence of infarct. Clinically negative stress test with no chest pain. Normal LV systolic function. Normal BP response to stress.    Past Medical History:  Diagnosis Date  . AAA (abdominal aortic aneurysm) ([place])    5.3cm , magd by vascular Dr Scot Dock ,  . Aortic stenosis   . Arthritis    knees  . Ascites   . Cancer [facility])    prostate  . Chronic kidney disease    stage 4, mgd by Dr Justin Mend   . 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([place])   . Coronary artery disease    on  CT scan  . Gout   . H/O hypercholesterolemia   . Heart murmur   . History of falling    recent- see ct chest results of 04/25/2018  . Hypertension   . Hypothyroidism   . Liver cirrhosis ([place])   . Prediabetes    pt denies   . Renal artery stenosis ([place])   . Thrombocytopenia ([place])   . Wears glasses     Past Surgical History:  Procedure Laterality Date  . ABDOMINAL AORTOGRAM W/LOWER EXTREMITY Bilateral 12/14/2018   Procedure: ABDOMINAL AORTOGRAM W/LOWER EXTREMITY;  Surgeon: Angelia Mould, MD;  Location: [place] [facility];  Service: Cardiovascular;  Laterality: Bilateral;  . CATARACT EXTRACTION W/ INTRAOCULAR LENS  IMPLANT, BILATERAL    . CHOLECYSTECTOMY    . ESOPHAGEAL BANDING N/A 08/11/2017   Procedure: ESOPHAGEAL BANDING;  Surgeon: Otis Brace, MD;  Location: [place];  Service:  Gastroenterology;  Laterality: N/A;  . ESOPHAGEAL BANDING N/A 10/26/2017   Procedure: ESOPHAGEAL BANDING;  Surgeon: Otis Brace, MD;  Location: [facility];  Service: Gastroenterology;  Laterality: N/A;  . ESOPHAGEAL BANDING N/A 12/19/2017   Procedure: ESOPHAGEAL BANDING;  Surgeon: Otis Brace, MD;  Location: [facility];  Service: Gastroenterology;  Laterality: N/A;  . ESOPHAGOGASTRODUODENOSCOPY (EGD) WITH PROPOFOL N/A 07/05/2017   Procedure: ESOPHAGOGASTRODUODENOSCOPY (EGD) WITH PROPOFOL;  Surgeon: Otis Brace, MD;  [facility];  Service: Gastroenterology;  Laterality: N/A;  . ESOPHAGOGASTRODUODENOSCOPY (EGD) WITH PROPOFOL N/A 08/11/2017   Procedure: ESOPHAGOGASTRODUODENOSCOPY (EGD) WITH PROPOFOL;  Surgeon: Otis Brace, MD;  [facility];  Service: Gastroenterology;  Laterality: N/A;  . ESOPHAGOGASTRODUODENOSCOPY (EGD) WITH PROPOFOL N/A 10/26/2017   Procedure: ESOPHAGOGASTRODUODENOSCOPY (EGD) WITH PROPOFOL;  Surgeon: Otis Brace, MD;  Location: [facility];  Service: Gastroenterology;  Laterality: N/A;  . ESOPHAGOGASTRODUODENOSCOPY (EGD) WITH PROPOFOL N/A 12/19/2017   Procedure: ESOPHAGOGASTRODUODENOSCOPY (EGD) WITH PROPOFOL;  Surgeon: Otis Brace, MD;  Location: [facility];  Service: Gastroenterology;  Laterality: N/A;  . ESOPHAGOGASTRODUODENOSCOPY (EGD) WITH PROPOFOL N/A 10/29/2018   Procedure: ESOPHAGOGASTRODUODENOSCOPY (EGD) WITH PROPOFOL;  Surgeon: Otis Brace, MD;  Location: [facility];  Service: Gastroenterology;  Laterality: N/A;  . EYE SURGERY     bilateral cataract removal with lens placement  . HERNIA REPAIR    . IR PARACENTESIS  12/26/2018  . IR PARACENTESIS  01/02/2019  . IR PARACENTESIS  01/15/2019  . PROSTATECTOMY    . subtotal gastrectomy      MEDICATIONS: . amLODipine (NORVASC) 5 MG tablet  . atorvastatin (LIPITOR) 10 MG tablet  . carvedilol (COREG) 12.5 MG tablet  . cloNIDine (CATAPRES) 0.2 MG tablet  .  colchicine 0.6 MG tablet  . febuxostat (ULORIC) 40 MG tablet  . levothyroxine (SYNTHROID, LEVOTHROID) 25 MCG tablet  . Multiple Vitamins-Minerals (MULTIVITAMIN GUMMIES ADULT) CHEW  . pantoprazole (PROTONIX) 40 MG tablet  . sodium bicarbonate 650 MG tablet  . traMADol (ULTRAM) 50 MG tablet   No current facility-administered medications for this encounter.      Myra Gianotti, PA-C Surgical Short Stay/Anesthesiology [facility] Phone [phone] [facility] Phone [phone] 01/18/2019 6:21 PM

## 2019-01-18 NOTE — Progress Notes (Signed)
[employer] Dr. Deitra Mayo about abnormal results: PLT 84 and Urinalysis showing protein and rare bacteria.

## 2019-01-18 NOTE — Telephone Encounter (Signed)
Per Dr. Scot Dock after receiving abnormal lab values from preop testing;   Patient's CBC will be repeated prior to surgery on Monday. Patient will be started on antibiotics per [facility] protocol for abnormal urinalysis today ( protein and rare bacteria).   Patient is to have an EVAR with CO2 and IVUS on Monday 01-21-19.   I called Robyn Haber, PA and told her about these new orders.   I tried to call patient but had to leave a message.

## 2019-01-18 NOTE — Progress Notes (Addendum)
PCP - [name], MD Cardiologist - Dr. Gwenith Spitz Nephrologist- Dr. Justin Mend GI- Dr. Otis Brace   PPM/ICD - Denies   Chest x-ray - N/A EKG - 12/14/2018 Stress Test - 03/06/17 ECHO - 12/18/2018 Cardiac Cath - 12/14/2018  Sleep Study - Denies   Patient denies being diabetic  Blood Thinner Instructions: N/A Aspirin Instructions: Patient states he is not currently taking ASA. Has not taken any in the month of November. He states he just stopped for no reason.  ERAS Protcol - N/A   COVID TEST- 01/18/2019   Anesthesia review: Yes, cardiac hx.  Patient denies shortness of breath, fever, cough and chest pain at PAT appointment  Coronavirus Screening  Have you experienced the following symptoms:  Cough yes/no: No Fever (>100.5F)  yes/no: No Runny nose yes/no: No Sore throat yes/no: No Difficulty breathing/shortness of breath  yes/no: No  Have you or a family member traveled in the last 14 days and where? No   If the patient indicates "YES" to the above questions, their PAT will be rescheduled to limit the exposure to others and, the surgeon will be notified. THE PATIENT WILL NEED TO BE ASYMPTOMATIC FOR 14 DAYS.   If the patient is not experiencing any of these symptoms, the PAT nurse will instruct them to NOT bring anyone with them to their appointment since they may have these symptoms or traveled as well.   Please remind your patients and families that hospital visitation restrictions are in effect and the importance of the restrictions.    All instructions explained to the patient, with a verbal understanding of the material. Patient agrees to go over the instructions while at home for a better understanding. Patient also instructed to self quarantine after being tested for COVID-19. The opportunity to ask questions was provided.

## 2019-01-19 ENCOUNTER — Other Ambulatory Visit (HOSPITAL_COMMUNITY)
Admission: RE | Admit: 2019-01-19 | Discharge: 2019-01-19 | Disposition: A | Discharge status: Home / Self Care | Payer: Medicare Other | Source: Ambulatory Visit | Attending: Vascular Surgery | Admitting: Vascular Surgery

## 2019-01-19 LAB — SARS CORONAVIRUS 2 (TAT 6-24 HRS): SARS Coronavirus 2: NEGATIVE

## 2019-01-20 ENCOUNTER — Encounter (HOSPITAL_COMMUNITY): Payer: Self-pay | Admitting: Certified Registered Nurse Anesthetist

## 2019-01-20 NOTE — Anesthesia Preprocedure Evaluation (Addendum)
Anesthesia Evaluation  Patient identified by MRN, date of birth, ID band Patient awake    Reviewed: Allergy & Precautions, NPO status , Patient's Chart, lab work & pertinent test results  Airway Mallampati: II  TM Distance: >3 FB     Dental  (+) Dental Advisory Given, Teeth Intact   Pulmonary neg pulmonary ROS,    breath sounds clear to auscultation       Cardiovascular hypertension, + CAD and + Peripheral Vascular Disease  + Valvular Problems/Murmurs  Rhythm:Regular Rate:Normal     Neuro/Psych negative neurological ROS     GI/Hepatic negative GI ROS, (+) Cirrhosis   Esophageal Varices    ,   Endo/Other  Hypothyroidism   Renal/GU CRFRenal disease     Musculoskeletal  (+) Arthritis ,   Abdominal   Peds  Hematology  (+) anemia ,   Anesthesia Other Findings   Reproductive/Obstetrics                            Anesthesia Physical  Anesthesia Plan  ASA: IV  Anesthesia Plan: General   Post-op Pain Management:    Induction: Intravenous  PONV Risk Score and Plan: 2 and Ondansetron, Treatment may vary due to age or medical condition and Dexamethasone  Airway Management Planned: Oral ETT  Additional Equipment: Arterial line  Intra-op Plan:   Post-operative Plan: Possible Post-op intubation/ventilation  Informed Consent: I have reviewed the patients History and Physical, chart, labs and discussed the procedure including the risks, benefits and alternatives for the proposed anesthesia with the patient or authorized representative who has indicated his/her understanding and acceptance.     Dental advisory given  Plan Discussed with: CRNA  Anesthesia Plan Comments: (2 x PIV   See PAT note written 01/18/2019 by Myra Gianotti, PA-C. Has cardiology and nephrology input. Last paracentesis 01/15/19.   )        Anesthesia Quick Evaluation

## 2019-01-21 ENCOUNTER — Encounter (HOSPITAL_COMMUNITY): Payer: Self-pay

## 2019-01-21 ENCOUNTER — Inpatient Hospital Stay (HOSPITAL_COMMUNITY): Payer: Medicare Other | Admitting: Certified Registered Nurse Anesthetist

## 2019-01-21 ENCOUNTER — Encounter (HOSPITAL_COMMUNITY)
Admission: RE | Disposition: A | Discharge status: Home / Self Care | Payer: Self-pay | Source: Home / Self Care | Attending: Vascular Surgery

## 2019-01-21 ENCOUNTER — Inpatient Hospital Stay (HOSPITAL_COMMUNITY): Payer: Medicare Other | Admitting: Vascular Surgery

## 2019-01-21 ENCOUNTER — Inpatient Hospital Stay (HOSPITAL_COMMUNITY): Payer: Medicare Other

## 2019-01-21 ENCOUNTER — Inpatient Hospital Stay (HOSPITAL_COMMUNITY)
Admission: RE | Admit: 2019-01-21 | Discharge: 2019-01-22 | DRG: 269 | Disposition: A | Discharge status: Home / Self Care | Payer: Medicare Other | Attending: Vascular Surgery | Admitting: Vascular Surgery

## 2019-01-21 ENCOUNTER — Other Ambulatory Visit: Payer: Self-pay

## 2019-01-21 DIAGNOSIS — I2510 Atherosclerotic heart disease of native coronary artery without angina pectoris: Secondary | ICD-10-CM | POA: Diagnosis present

## 2019-01-21 DIAGNOSIS — I714 Abdominal aortic aneurysm, without rupture, unspecified: Secondary | ICD-10-CM | POA: Diagnosis present

## 2019-01-21 DIAGNOSIS — E785 Hyperlipidemia, unspecified: Secondary | ICD-10-CM | POA: Diagnosis present

## 2019-01-21 DIAGNOSIS — R011 Cardiac murmur, unspecified: Secondary | ICD-10-CM | POA: Diagnosis present

## 2019-01-21 DIAGNOSIS — E875 Hyperkalemia: Secondary | ICD-10-CM | POA: Diagnosis not present

## 2019-01-21 DIAGNOSIS — Z7982 Long term (current) use of aspirin: Secondary | ICD-10-CM | POA: Diagnosis not present

## 2019-01-21 DIAGNOSIS — I350 Nonrheumatic aortic (valve) stenosis: Secondary | ICD-10-CM | POA: Diagnosis present

## 2019-01-21 DIAGNOSIS — M109 Gout, unspecified: Secondary | ICD-10-CM | POA: Diagnosis present

## 2019-01-21 DIAGNOSIS — I129 Hypertensive chronic kidney disease with stage 1 through stage 4 chronic kidney disease, or unspecified chronic kidney disease: Secondary | ICD-10-CM | POA: Diagnosis present

## 2019-01-21 DIAGNOSIS — K7469 Other cirrhosis of liver: Secondary | ICD-10-CM | POA: Diagnosis present

## 2019-01-21 DIAGNOSIS — I739 Peripheral vascular disease, unspecified: Secondary | ICD-10-CM | POA: Diagnosis present

## 2019-01-21 DIAGNOSIS — E039 Hypothyroidism, unspecified: Secondary | ICD-10-CM | POA: Diagnosis present

## 2019-01-21 DIAGNOSIS — Z9181 History of falling: Secondary | ICD-10-CM | POA: Diagnosis not present

## 2019-01-21 DIAGNOSIS — I700 Atherosclerosis of aorta: Secondary | ICD-10-CM | POA: Diagnosis present

## 2019-01-21 DIAGNOSIS — I701 Atherosclerosis of renal artery: Secondary | ICD-10-CM | POA: Diagnosis present

## 2019-01-21 DIAGNOSIS — R188 Other ascites: Secondary | ICD-10-CM | POA: Diagnosis present

## 2019-01-21 DIAGNOSIS — Z20828 Contact with and (suspected) exposure to other viral communicable diseases: Secondary | ICD-10-CM | POA: Diagnosis present

## 2019-01-21 DIAGNOSIS — Z888 Allergy status to other drugs, medicaments and biological substances status: Secondary | ICD-10-CM

## 2019-01-21 DIAGNOSIS — Z9079 Acquired absence of other genital organ(s): Secondary | ICD-10-CM

## 2019-01-21 DIAGNOSIS — Z903 Acquired absence of stomach [part of]: Secondary | ICD-10-CM

## 2019-01-21 DIAGNOSIS — Z8546 Personal history of malignant neoplasm of prostate: Secondary | ICD-10-CM

## 2019-01-21 DIAGNOSIS — Z833 Family history of diabetes mellitus: Secondary | ICD-10-CM

## 2019-01-21 DIAGNOSIS — Z960 Presence of urogenital implants: Secondary | ICD-10-CM | POA: Diagnosis present

## 2019-01-21 DIAGNOSIS — Z79890 Hormone replacement therapy (postmenopausal): Secondary | ICD-10-CM

## 2019-01-21 DIAGNOSIS — D696 Thrombocytopenia, unspecified: Secondary | ICD-10-CM | POA: Diagnosis present

## 2019-01-21 DIAGNOSIS — Z79899 Other long term (current) drug therapy: Secondary | ICD-10-CM

## 2019-01-21 DIAGNOSIS — Z79891 Long term (current) use of opiate analgesic: Secondary | ICD-10-CM

## 2019-01-21 DIAGNOSIS — Z881 Allergy status to other antibiotic agents status: Secondary | ICD-10-CM | POA: Diagnosis not present

## 2019-01-21 DIAGNOSIS — N184 Chronic kidney disease, stage 4 (severe): Secondary | ICD-10-CM | POA: Diagnosis present

## 2019-01-21 HISTORY — PX: ABDOMINAL AORTIC ENDOVASCULAR STENT GRAFT: SHX5707

## 2019-01-21 LAB — BASIC METABOLIC PANEL
Anion gap: 7 (ref 5–15)
BUN: 62 mg/dL — ABNORMAL HIGH (ref 8–23)
CO2: 14 mmol/L — ABNORMAL LOW (ref 22–32)
Calcium: 7.1 mg/dL — ABNORMAL LOW (ref 8.9–10.3)
Chloride: 117 mmol/L — ABNORMAL HIGH (ref 98–111)
Creatinine, Ser: 3.68 mg/dL — ABNORMAL HIGH (ref 0.61–1.24)
GFR calc Af Amer: 17 mL/min — ABNORMAL LOW (ref >60)
GFR calc non Af Amer: 15 mL/min — ABNORMAL LOW (ref >60)
Glucose, Bld: 118 mg/dL — ABNORMAL HIGH (ref 70–99)
Potassium: 5.6 mmol/L — ABNORMAL HIGH (ref 3.5–5.1)
Sodium: 138 mmol/L (ref 135–145)

## 2019-01-21 LAB — CBC
HCT: 31.5 % — ABNORMAL LOW (ref 39.0–52.0)
HCT: 29.1 % — ABNORMAL LOW (ref 39.0–52.0)
Hemoglobin: 9.9 g/dL — ABNORMAL LOW (ref 13.0–17.0)
Hemoglobin: 9.3 g/dL — ABNORMAL LOW (ref 13.0–17.0)
MCH: 33 pg (ref 26.0–34.0)
MCH: 33.5 pg (ref 26.0–34.0)
MCHC: 31.4 g/dL (ref 30.0–36.0)
MCHC: 32 g/dL (ref 30.0–36.0)
MCV: 105 fL — ABNORMAL HIGH (ref 80.0–100.0)
MCV: 104.7 fL — ABNORMAL HIGH (ref 80.0–100.0)
Platelets: 75 10*3/uL — ABNORMAL LOW (ref 150–400)
Platelets: 81 10*3/uL — ABNORMAL LOW (ref 150–400)
RBC: 3 MIL/uL — ABNORMAL LOW (ref 4.22–5.81)
RBC: 2.78 MIL/uL — ABNORMAL LOW (ref 4.22–5.81)
RDW: 15.8 % — ABNORMAL HIGH (ref 11.5–15.5)
RDW: 15.7 % — ABNORMAL HIGH (ref 11.5–15.5)
WBC: 3.4 10*3/uL — ABNORMAL LOW (ref 4.0–10.5)
WBC: 5.5 10*3/uL (ref 4.0–10.5)
nRBC: 0 % (ref 0.0–0.2)
nRBC: 0 % (ref 0.0–0.2)

## 2019-01-21 LAB — PROTIME-INR
INR: 1.4 — ABNORMAL HIGH (ref 0.8–1.2)
Prothrombin Time: 16.9 seconds — ABNORMAL HIGH (ref 11.4–15.2)

## 2019-01-21 LAB — GLUCOSE, CAPILLARY: Glucose-Capillary: 93 mg/dL (ref 70–99)

## 2019-01-21 LAB — APTT: aPTT: 41 seconds — ABNORMAL HIGH (ref 24–36)

## 2019-01-21 SURGERY — INSERTION, ENDOVASCULAR STENT GRAFT, AORTA, ABDOMINAL
Anesthesia: General

## 2019-01-21 MED ORDER — SODIUM CHLORIDE 0.9 % IV SOLN
INTRAVENOUS | Status: DC | PRN
  Administered 2019-01-21: 500 mL

## 2019-01-21 MED ORDER — VANCOMYCIN HCL IN DEXTROSE 1-5 GM/200ML-% IV SOLN
1000.0000 mg | INTRAVENOUS | Status: AC
  Administered 2019-01-21: 06:00:00 1000 mg via INTRAVENOUS

## 2019-01-21 MED ORDER — ATORVASTATIN CALCIUM 10 MG PO TABS
10.0000 mg | ORAL_TABLET | Freq: Every day | ORAL | Status: DC
  Administered 2019-01-21: 10 mg via ORAL
  Filled 2019-01-21: qty 1

## 2019-01-21 MED ORDER — ONDANSETRON HCL 4 MG/2ML IJ SOLN
INTRAMUSCULAR | Status: AC
  Filled 2019-01-21: qty 2

## 2019-01-21 MED ORDER — FEBUXOSTAT 40 MG PO TABS
40.0000 mg | ORAL_TABLET | Freq: Every day | ORAL | Status: DC
  Administered 2019-01-22: 09:00:00 40 mg via ORAL
  Filled 2019-01-21 (×2): qty 1

## 2019-01-21 MED ORDER — SODIUM BICARBONATE 650 MG PO TABS
650.0000 mg | ORAL_TABLET | Freq: Two times a day (BID) | ORAL | Status: DC
  Administered 2019-01-21 – 2019-01-22 (×2): 650 mg via ORAL
  Filled 2019-01-21 (×2): qty 1

## 2019-01-21 MED ORDER — DEXAMETHASONE SODIUM PHOSPHATE 10 MG/ML IJ SOLN
INTRAMUSCULAR | Status: DC | PRN
  Administered 2019-01-21: 10 mg via INTRAVENOUS

## 2019-01-21 MED ORDER — HEPARIN SODIUM (PORCINE) 5000 UNIT/ML IJ SOLN: 5000.0000 [IU] | Freq: Three times a day (TID) | INTRAMUSCULAR | Status: DC

## 2019-01-21 MED ORDER — ACETAMINOPHEN 325 MG PO TABS
325.0000 mg | ORAL_TABLET | ORAL | Status: DC | PRN
  Filled 2019-01-21: qty 2

## 2019-01-21 MED ORDER — COLCHICINE 0.6 MG PO TABS: 0.6000 mg | ORAL_TABLET | Freq: Every day | ORAL | Status: DC | PRN

## 2019-01-21 MED ORDER — FENTANYL CITRATE (PF) 100 MCG/2ML IJ SOLN
INTRAMUSCULAR | Status: DC | PRN
  Administered 2019-01-21 (×2): 50 ug via INTRAVENOUS
  Administered 2019-01-21: 100 ug via INTRAVENOUS
  Administered 2019-01-21: 50 ug via INTRAVENOUS

## 2019-01-21 MED ORDER — SODIUM CHLORIDE 0.9 % IV SOLN
INTRAVENOUS | Status: AC
  Filled 2019-01-21: qty 1.2

## 2019-01-21 MED ORDER — SODIUM CHLORIDE 0.9 % IV SOLN: 500.0000 mL | Freq: Once | INTRAVENOUS | Status: DC | PRN

## 2019-01-21 MED ORDER — ONDANSETRON HCL 4 MG/2ML IJ SOLN
INTRAMUSCULAR | Status: DC | PRN
  Administered 2019-01-21: 4 mg via INTRAVENOUS

## 2019-01-21 MED ORDER — FENTANYL CITRATE (PF) 250 MCG/5ML IJ SOLN
INTRAMUSCULAR | Status: AC
  Filled 2019-01-21: qty 5

## 2019-01-21 MED ORDER — PHENYLEPHRINE HCL (PRESSORS) 10 MG/ML IV SOLN
INTRAVENOUS | Status: DC | PRN
  Administered 2019-01-21 (×2): 40 ug via INTRAVENOUS

## 2019-01-21 MED ORDER — SODIUM CHLORIDE 0.9% IV SOLUTION: Freq: Once | INTRAVENOUS | Status: DC

## 2019-01-21 MED ORDER — EPHEDRINE SULFATE 50 MG/ML IJ SOLN
INTRAMUSCULAR | Status: DC | PRN
  Administered 2019-01-21 (×3): 10 mg via INTRAVENOUS

## 2019-01-21 MED ORDER — FENTANYL CITRATE (PF) 100 MCG/2ML IJ SOLN: 25.0000 ug | INTRAMUSCULAR | Status: DC | PRN

## 2019-01-21 MED ORDER — LIDOCAINE 2% (20 MG/ML) 5 ML SYRINGE
INTRAMUSCULAR | Status: AC
  Filled 2019-01-21: qty 5

## 2019-01-21 MED ORDER — LABETALOL HCL 5 MG/ML IV SOLN: 10.0000 mg | INTRAVENOUS | Status: DC | PRN

## 2019-01-21 MED ORDER — CHLORHEXIDINE GLUCONATE CLOTH 2 % EX PADS: 6.0000 | MEDICATED_PAD | Freq: Once | CUTANEOUS | Status: DC

## 2019-01-21 MED ORDER — OXYCODONE-ACETAMINOPHEN 5-325 MG PO TABS: 1.0000 | ORAL_TABLET | ORAL | Status: DC | PRN

## 2019-01-21 MED ORDER — DEXAMETHASONE SODIUM PHOSPHATE 10 MG/ML IJ SOLN
INTRAMUSCULAR | Status: AC
  Filled 2019-01-21: qty 1

## 2019-01-21 MED ORDER — EPHEDRINE 5 MG/ML INJ
INTRAVENOUS | Status: AC
  Filled 2019-01-21: qty 10

## 2019-01-21 MED ORDER — CLONIDINE HCL 0.2 MG PO TABS
0.2000 mg | ORAL_TABLET | Freq: Two times a day (BID) | ORAL | Status: DC
  Administered 2019-01-21 – 2019-01-22 (×2): 0.2 mg via ORAL
  Filled 2019-01-21 (×2): qty 1

## 2019-01-21 MED ORDER — VANCOMYCIN HCL IN DEXTROSE 1-5 GM/200ML-% IV SOLN
INTRAVENOUS | Status: AC
  Administered 2019-01-21: 06:00:00 1000 mg via INTRAVENOUS
  Filled 2019-01-21: qty 200

## 2019-01-21 MED ORDER — AMLODIPINE BESYLATE 5 MG PO TABS
5.0000 mg | ORAL_TABLET | Freq: Every day | ORAL | Status: DC
  Administered 2019-01-22: 5 mg via ORAL
  Filled 2019-01-21 (×2): qty 1

## 2019-01-21 MED ORDER — GUAIFENESIN-DM 100-10 MG/5ML PO SYRP: 15.0000 mL | ORAL_SOLUTION | ORAL | Status: DC | PRN

## 2019-01-21 MED ORDER — PHENYLEPHRINE HCL-NACL 10-0.9 MG/250ML-% IV SOLN
INTRAVENOUS | Status: DC | PRN
  Administered 2019-01-21: 25 ug/min via INTRAVENOUS

## 2019-01-21 MED ORDER — HYDRALAZINE HCL 20 MG/ML IJ SOLN: 5.0000 mg | INTRAMUSCULAR | Status: DC | PRN

## 2019-01-21 MED ORDER — ROCURONIUM BROMIDE 10 MG/ML (PF) SYRINGE
PREFILLED_SYRINGE | INTRAVENOUS | Status: AC
  Filled 2019-01-21: qty 10

## 2019-01-21 MED ORDER — MULTIVITAMIN GUMMIES ADULT PO CHEW: 2.0000 | CHEWABLE_TABLET | Freq: Every day | ORAL | Status: DC

## 2019-01-21 MED ORDER — PROPOFOL 10 MG/ML IV BOLUS
INTRAVENOUS | Status: AC
  Filled 2019-01-21: qty 20

## 2019-01-21 MED ORDER — ACETAMINOPHEN 325 MG RE SUPP: 325.0000 mg | RECTAL | Status: DC | PRN

## 2019-01-21 MED ORDER — CARVEDILOL 12.5 MG PO TABS
12.5000 mg | ORAL_TABLET | Freq: Two times a day (BID) | ORAL | Status: DC
  Administered 2019-01-21 – 2019-01-22 (×2): 12.5 mg via ORAL
  Filled 2019-01-21 (×2): qty 1

## 2019-01-21 MED ORDER — PROPOFOL 10 MG/ML IV BOLUS
INTRAVENOUS | Status: DC | PRN
  Administered 2019-01-21: 50 mg via INTRAVENOUS
  Administered 2019-01-21: 30 mg via INTRAVENOUS

## 2019-01-21 MED ORDER — DOCUSATE SODIUM 100 MG PO CAPS
100.0000 mg | ORAL_CAPSULE | Freq: Every day | ORAL | Status: DC
  Administered 2019-01-22: 09:00:00 100 mg via ORAL
  Filled 2019-01-21 (×2): qty 1

## 2019-01-21 MED ORDER — TRAMADOL HCL 50 MG PO TABS: 50.0000 mg | ORAL_TABLET | Freq: Every day | ORAL | Status: DC | PRN

## 2019-01-21 MED ORDER — SUGAMMADEX SODIUM 200 MG/2ML IV SOLN
INTRAVENOUS | Status: DC | PRN
  Administered 2019-01-21: 200 mg via INTRAVENOUS

## 2019-01-21 MED ORDER — ONDANSETRON HCL 4 MG/2ML IJ SOLN: 4.0000 mg | Freq: Once | INTRAMUSCULAR | Status: DC | PRN

## 2019-01-21 MED ORDER — LACTATED RINGERS IV SOLN
INTRAVENOUS | Status: DC
  Administered 2019-01-21: 06:00:00 via INTRAVENOUS

## 2019-01-21 MED ORDER — ALUM & MAG HYDROXIDE-SIMETH 200-200-20 MG/5ML PO SUSP: 15.0000 mL | ORAL | Status: DC | PRN

## 2019-01-21 MED ORDER — HYDROMORPHONE HCL 1 MG/ML IJ SOLN: 0.5000 mg | INTRAMUSCULAR | Status: DC | PRN

## 2019-01-21 MED ORDER — ADULT MULTIVITAMIN W/MINERALS CH
1.0000 | ORAL_TABLET | Freq: Every day | ORAL | Status: DC
  Administered 2019-01-22: 1 via ORAL
  Filled 2019-01-21 (×2): qty 1

## 2019-01-21 MED ORDER — METOPROLOL TARTRATE 5 MG/5ML IV SOLN: 2.0000 mg | INTRAVENOUS | Status: DC | PRN

## 2019-01-21 MED ORDER — PROTAMINE SULFATE 10 MG/ML IV SOLN
INTRAVENOUS | Status: DC | PRN
  Administered 2019-01-21 (×5): 10 mg via INTRAVENOUS

## 2019-01-21 MED ORDER — 0.9 % SODIUM CHLORIDE (POUR BTL) OPTIME
TOPICAL | Status: DC | PRN
  Administered 2019-01-21: 1000 mL

## 2019-01-21 MED ORDER — MAGNESIUM SULFATE 2 GM/50ML IV SOLN: 2.0000 g | Freq: Every day | INTRAVENOUS | Status: DC | PRN

## 2019-01-21 MED ORDER — HEMOSTATIC AGENTS (NO CHARGE) OPTIME
TOPICAL | Status: DC | PRN
  Administered 2019-01-21: 1 via TOPICAL

## 2019-01-21 MED ORDER — PHENYLEPHRINE 40 MCG/ML (10ML) SYRINGE FOR IV PUSH (FOR BLOOD PRESSURE SUPPORT)
PREFILLED_SYRINGE | INTRAVENOUS | Status: AC
  Filled 2019-01-21: qty 10

## 2019-01-21 MED ORDER — POTASSIUM CHLORIDE CRYS ER 20 MEQ PO TBCR: 20.0000 meq | EXTENDED_RELEASE_TABLET | Freq: Every day | ORAL | Status: DC | PRN

## 2019-01-21 MED ORDER — LIDOCAINE HCL (CARDIAC) PF 100 MG/5ML IV SOSY
PREFILLED_SYRINGE | INTRAVENOUS | Status: DC | PRN
  Administered 2019-01-21: 100 mg via INTRAVENOUS

## 2019-01-21 MED ORDER — ROCURONIUM BROMIDE 100 MG/10ML IV SOLN
INTRAVENOUS | Status: DC | PRN
  Administered 2019-01-21: 60 mg via INTRAVENOUS

## 2019-01-21 MED ORDER — MIDAZOLAM HCL 2 MG/2ML IJ SOLN
INTRAMUSCULAR | Status: AC
  Filled 2019-01-21: qty 2

## 2019-01-21 MED ORDER — CIPROFLOXACIN HCL 500 MG PO TABS: 500.0000 mg | ORAL_TABLET | Freq: Two times a day (BID) | ORAL | Status: DC

## 2019-01-21 MED ORDER — MEPERIDINE HCL 25 MG/ML IJ SOLN: 6.2500 mg | INTRAMUSCULAR | Status: DC | PRN

## 2019-01-21 MED ORDER — CEFAZOLIN SODIUM-DEXTROSE 2-4 GM/100ML-% IV SOLN: 2.0000 g | Freq: Three times a day (TID) | INTRAVENOUS | Status: DC

## 2019-01-21 MED ORDER — DEXTROSE-NACL 5-0.45 % IV SOLN
INTRAVENOUS | Status: DC
  Administered 2019-01-21: 14:00:00 via INTRAVENOUS

## 2019-01-21 MED ORDER — ENOXAPARIN SODIUM 30 MG/0.3ML ~~LOC~~ SOLN: 30.0000 mg | SUBCUTANEOUS | Status: DC

## 2019-01-21 MED ORDER — IODIXANOL 320 MG/ML IV SOLN
INTRAVENOUS | Status: DC | PRN
  Administered 2019-01-21: 23.5 mL via INTRA_ARTERIAL

## 2019-01-21 MED ORDER — SODIUM CHLORIDE 0.9 % IV SOLN
INTRAVENOUS | Status: DC | PRN
  Administered 2019-01-21: 08:00:00 via INTRAVENOUS

## 2019-01-21 MED ORDER — PANTOPRAZOLE SODIUM 40 MG PO TBEC
40.0000 mg | DELAYED_RELEASE_TABLET | Freq: Every day | ORAL | Status: DC
  Administered 2019-01-22: 09:00:00 40 mg via ORAL
  Filled 2019-01-21 (×2): qty 1

## 2019-01-21 MED ORDER — HEPARIN SODIUM (PORCINE) 5000 UNIT/ML IJ SOLN
5000.0000 [IU] | Freq: Three times a day (TID) | INTRAMUSCULAR | Status: DC
  Administered 2019-01-22: 5000 [IU] via SUBCUTANEOUS
  Filled 2019-01-21: qty 1

## 2019-01-21 MED ORDER — ONDANSETRON HCL 4 MG/2ML IJ SOLN: 4.0000 mg | Freq: Four times a day (QID) | INTRAMUSCULAR | Status: DC | PRN

## 2019-01-21 MED ORDER — PHENOL 1.4 % MT LIQD: 1.0000 | OROMUCOSAL | Status: DC | PRN

## 2019-01-21 MED ORDER — HEPARIN SODIUM (PORCINE) 1000 UNIT/ML IJ SOLN
INTRAMUSCULAR | Status: DC | PRN
  Administered 2019-01-21: 2000 [IU] via INTRAVENOUS
  Administered 2019-01-21: 8000 [IU] via INTRAVENOUS
  Administered 2019-01-21: 2000 [IU] via INTRAVENOUS

## 2019-01-21 MED ORDER — LEVOTHYROXINE SODIUM 25 MCG PO TABS
25.0000 ug | ORAL_TABLET | Freq: Every day | ORAL | Status: DC
  Administered 2019-01-22: 25 ug via ORAL
  Filled 2019-01-21: qty 1

## 2019-01-21 SURGICAL SUPPLY — 71 items
BLADE CLIPPER SURG (BLADE) ×1 IMPLANT
CANISTER SUCT 3000ML PPV (MISCELLANEOUS) ×2 IMPLANT
CATH BEACON 5.038 65CM KMP-01 (CATHETERS) ×2 IMPLANT
CATH OMNI FLUSH .035X70CM (CATHETERS) ×1 IMPLANT
CATH VANSCH 5FR 6CM (CATHETERS) ×1 IMPLANT
CLIP VESOCCLUDE MED 6/CT (CLIP) ×1 IMPLANT
CLIP VESOCCLUDE SM WIDE 6/CT (CLIP) ×1 IMPLANT
COVER WAND RF STERILE (DRAPES) ×1 IMPLANT
DERMABOND ADVANCED (GAUZE/BANDAGES/DRESSINGS) ×1
DERMABOND ADVANCED .7 DNX12 (GAUZE/BANDAGES/DRESSINGS) ×1 IMPLANT
DEVICE CLOSURE PERCLS PRGLD 6F (VASCULAR PRODUCTS) IMPLANT
DEVICE TORQUE KENDALL .025-038 (MISCELLANEOUS) ×1 IMPLANT
DRSG TEGADERM 2-3/8X2-3/4 SM (GAUZE/BANDAGES/DRESSINGS) ×3 IMPLANT
DRYSEAL FLEXSHEATH 12FR 33CM (SHEATH) ×1
DRYSEAL FLEXSHEATH 16FR 33CM (SHEATH) ×1
ELECT CAUTERY BLADE 6.4 (BLADE) ×1 IMPLANT
ELECT REM PT RETURN 9FT ADLT (ELECTROSURGICAL) ×4
ELECTRODE REM PT RTRN 9FT ADLT (ELECTROSURGICAL) ×2 IMPLANT
EXCLUDER TNK 26X14.5MMX12CM (Endovascular Graft) IMPLANT
EXCLUDER TRUNK 26X14.5MMX12CM (Endovascular Graft) ×2 IMPLANT
GAUZE SPONGE 2X2 8PLY STRL LF (GAUZE/BANDAGES/DRESSINGS) ×2 IMPLANT
GLOVE BIO SURGEON STRL SZ7.5 (GLOVE) ×3 IMPLANT
GLOVE BIOGEL PI IND STRL 8 (GLOVE) ×1 IMPLANT
GLOVE BIOGEL PI INDICATOR 8 (GLOVE) ×2
GOWN STRL REUS W/ TWL LRG LVL3 (GOWN DISPOSABLE) ×3 IMPLANT
GOWN STRL REUS W/TWL LRG LVL3 (GOWN DISPOSABLE) ×3
GRAFT BALLN CATH 65CM (STENTS) ×1 IMPLANT
GUIDEWIRE ANGLED .035X150CM (WIRE) ×1 IMPLANT
HEMOSTAT HEMOBLAST BELLOWS (HEMOSTASIS) ×1 IMPLANT
KIT BASIN OR (CUSTOM PROCEDURE TRAY) ×2 IMPLANT
KIT TURNOVER KIT B (KITS) ×2 IMPLANT
LEG CONTRALATERAL 16X16X13.5 (Endovascular Graft) ×1 IMPLANT
LEG CONTRALATERAL 16X16X9.5 (Endovascular Graft) ×1 IMPLANT
LOOP VESSEL MAXI BLUE (MISCELLANEOUS) ×2 IMPLANT
LOOP VESSEL MINI RED (MISCELLANEOUS) ×1 IMPLANT
NDL PERC 18GX7CM (NEEDLE) ×1 IMPLANT
NEEDLE PERC 18GX7CM (NEEDLE) ×2 IMPLANT
NS IRRIG 1000ML POUR BTL (IV SOLUTION) ×2 IMPLANT
PACK ENDOVASCULAR (PACKS) ×2 IMPLANT
PAD ARMBOARD 7.5X6 YLW CONV (MISCELLANEOUS) ×4 IMPLANT
PENCIL BUTTON HOLSTER BLD 10FT (ELECTRODE) ×1 IMPLANT
PERCLOSE PROGLIDE 6F (VASCULAR PRODUCTS) ×4
RESERVOIR CO2 (VASCULAR PRODUCTS) ×1 IMPLANT
SET MICROPUNCTURE 5F STIFF (MISCELLANEOUS) ×2 IMPLANT
SHEATH BRITE TIP 8FR 23CM (SHEATH) ×2 IMPLANT
SHEATH DRYSEAL FLEX 12FR 33CM (SHEATH) IMPLANT
SHEATH DRYSEAL FLEX 16FR 33CM (SHEATH) IMPLANT
SHEATH PINNACLE 8F 10CM (SHEATH) ×2 IMPLANT
SPONGE GAUZE 2X2 8PLY STRL LF (GAUZE/BANDAGES/DRESSINGS) ×1 IMPLANT
SPONGE GAUZE 2X2 STER 10/PKG (GAUZE/BANDAGES/DRESSINGS) ×2
STENT GRAFT BALLN CATH 65CM (STENTS) ×1
STENT GRAFT CONTRALAT 16X13.5 (Endovascular Graft) IMPLANT
STOPCOCK MORSE 400PSI 3WAY (MISCELLANEOUS) ×3 IMPLANT
SUT PROLENE 5 0 C 1 24 (SUTURE) ×1 IMPLANT
SUT PROLENE 6 0 BV (SUTURE) ×1 IMPLANT
SUT SILK 2 0 (SUTURE) ×1
SUT SILK 2-0 18XBRD TIE 12 (SUTURE) IMPLANT
SUT SILK 3 0 (SUTURE) ×1
SUT SILK 3-0 18XBRD TIE 12 (SUTURE) IMPLANT
SUT SILK 4 0 (SUTURE) ×1
SUT SILK 4-0 18XBRD TIE 12 (SUTURE) IMPLANT
SUT VIC AB 2-0 CTB1 (SUTURE) ×1 IMPLANT
SUT VIC AB 3-0 SH 27 (SUTURE) ×1
SUT VIC AB 3-0 SH 27X BRD (SUTURE) IMPLANT
SUT VICRYL 4-0 PS2 18IN ABS (SUTURE) ×4 IMPLANT
SYR 20ML LL LF (SYRINGE) ×2 IMPLANT
TOWEL GREEN STERILE (TOWEL DISPOSABLE) ×2 IMPLANT
TRAY FOLEY MTR SLVR 16FR STAT (SET/KITS/TRAYS/PACK) ×2 IMPLANT
TUBING HIGH PRESSURE 120CM (CONNECTOR) ×2 IMPLANT
WIRE AMPLATZ SS-J .035X180CM (WIRE) ×4 IMPLANT
WIRE BENTSON .035X145CM (WIRE) ×4 IMPLANT

## 2019-01-21 NOTE — Addendum Note (Signed)
Addendum  created 01/21/19 1846 by Ollen Bowl, CRNA   Intraprocedure Event edited

## 2019-01-21 NOTE — Progress Notes (Signed)
    No new complaints s/p EVAR Palpable pedal pulses, groins soft B without hematoma  Stable post op disposition  Roxy Horseman PA-C

## 2019-01-21 NOTE — H&P (Addendum)
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  ASSESSMENT & PLAN:   5.5 CM INFRARENAL ABDOMINAL AORTIC ANEURYSM: Patient is felt to be a reasonable candidate for endovascular repair of his aneurysm.  Given his ascites and hardware in the abdomen related to his penile prosthesis he is a poor candidate for open repair.  He does have chronic kidney disease and will ultimately need a fistula.  I have discussed this with Dr. Edrick Oh.  However given the size of the aneurysm I think we need to proceed with repair now and not delay this any further.  He has undergone preoperative cardiac clearance.  I have discussed the indications for the procedure with him and the potential complications and he is agreeable to proceed.  We will try to use minimal or no contrast.  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  Although he is at slightly increased risk for bleeding complications again I feel that we need to proceed with repair of his aneurysm given the size and risk of rupture.  We will need to follow his platelet count closely after surgery.  Deitra Mayo, MD Office: [phone]   HPI:   Ricky Harris is a pleasant 78 y.o. male, who I originally saw in consultation in March of this year with a 5.3 cm infrarenal abdominal aortic aneurysm.  He fell in February from a window was having left lower quadrant and left rib pain.  This prompted a CT scan.  Incidental finding was a 5.3 cm infrarenal abdominal aortic aneurysm.  This increased in size from 4.8 cm in 2019.  On a follow-up study in September this had enlarged to 5.5 cm.  Past Medical History:  Diagnosis Date  . AAA (abdominal aortic aneurysm) ([place])    5.3cm , magd by vascular Dr Scot Dock ,  . Aortic stenosis   . Arthritis    knees  . Ascites   . Cancer [facility])    prostate  . Chronic kidney disease    stage 4, mgd by Dr Justin Mend   . 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([place])   . Coronary artery disease    on CT scan  . Gout   . H/O hypercholesterolemia   . Heart murmur   . History of falling    recent- see ct chest results of 04/25/2018  . Hypertension   . Hypothyroidism   . Liver cirrhosis ([place])   . Prediabetes    pt denies   . Renal artery stenosis ([place])   . Thrombocytopenia ([place])   . Wears glasses     Family History  Problem Relation Age of Onset  . Diabetes Mellitus II Mother   . Stroke Mother     SOCIAL HISTORY: Social History   Socioeconomic History  . Marital status: Divorced    Spouse name: Not on file  . Number of children: Not on file  . Years of education: Not on file  . Highest education level: Not on file  Occupational History  . Not on file  Social Needs  . Financial resource strain: Not on file  . Food insecurity    Worry: Not on file    Inability: Not on file  . Transportation needs    Medical: Not on file    Non-medical: Not on file  Tobacco Use  . Smoking status: Never Smoker  . Smokeless tobacco: Never Used  Substance and Sexual Activity  .  Alcohol use: Not Currently  . Drug use: Never  . Sexual activity: Not on file  Lifestyle  . Physical activity    Days per week: Not on file    Minutes per session: Not on file  . Stress: Not on file  Relationships  . Social Herbalist on phone: Not on file    Gets together: Not on file    Attends religious service: Not on file    Active member of club or organization: Not on file    Attends meetings of clubs or organizations: Not on file    Relationship status: Not on file  . Intimate partner violence    Fear of current or ex partner: Not on file    Emotionally abused: Not on file    Physically abused: Not on file    Forced sexual activity: Not on file  Other Topics Concern  . Not on file  Social History Narrative  . Not on file    Allergies  Allergen Reactions  . Cephalexin Hives  . Hydralazine Hives    Current Facility-Administered  Medications  Medication Dose Route Frequency Provider Last Rate Last Dose  . Chlorhexidine Gluconate Cloth 2 % PADS 6 each  6 each Topical Once Angelia Mould, MD       And  . Chlorhexidine Gluconate Cloth 2 % PADS 6 each  6 each Topical Once Angelia Mould, MD      . lactated ringers infusion   Intravenous Continuous Nolon Nations, MD 10 mL/hr at 01/21/19 0607      REVIEW OF SYSTEMS:  [X]  denotes positive finding, [ ]  denotes negative finding Cardiac  Comments:  Chest pain or chest pressure:    Shortness of breath upon exertion:    Short of breath when lying flat:    Irregular heart rhythm:        Vascular    Pain in calf, thigh, or hip brought on by ambulation:    Pain in feet at night that wakes you up from your sleep:     Blood clot in your veins:    Leg swelling:         Pulmonary    Oxygen at home:    Productive cough:     Wheezing:         Neurologic    Sudden weakness in arms or legs:     Sudden numbness in arms or legs:     Sudden onset of difficulty speaking or slurred speech:    Temporary loss of vision in one eye:     Problems with dizziness:         Gastrointestinal    Blood in stool:     Vomited blood:         Genitourinary    Burning when urinating:     Blood in urine:        Psychiatric    Major depression:         Hematologic    Bleeding problems:    Problems with blood clotting too easily:        Skin    Rashes or ulcers:        Constitutional    Fever or chills:     PHYSICAL EXAM:   Vitals:   01/21/19 0605 01/21/19 0616  BP:  (!) 166/70  Pulse:  (!) 55  Resp:  18  Temp:  97.9 F (36.6 C)  TempSrc:  Oral  SpO2:  100%  Weight: 79.1 kg   Height: 6' (1.829 m)     GENERAL: The patient is a well-nourished male, in no acute distress. The vital signs are documented above. CARDIAC: There is a regular rate and rhythm.  He has a systolic ejection murmur VASCULAR: I do not detect carotid bruits. He has palpable femoral  pulses bilaterally and palpable pedal pulses. PULMONARY: There is good air exchange bilaterally without wheezing or rales. ABDOMEN: Soft and non-tender with normal pitched bowel sounds.  MUSCULOSKELETAL: There are no major deformities or cyanosis. NEUROLOGIC: No focal weakness or paresthesias are detected. SKIN: There are no ulcers or rashes noted. PSYCHIATRIC: The patient has a normal affect.  DATA:    LABS: His GFR is 14.  Creatinine is 3.96.  Platelets are 75,000.     Patient name: Ricky Harris          DOB: October 01, 1940        Sex: male                    Referring Provider is [name]., MD                    PCP is [name]., MD  REASON FOR VIRTUAL VISIT: Follow-up of abdominal aortic aneurysm  I connected withDenis C Pritz on 11/21/18 at 10:20 AM EDT by a video enabled telemedicine application and verified that I am speaking with the correct person using two identifiers. I discussed the limitations of evaluation and management by telemedicine and the availability of in person appointments. The patient expressed understanding and agreed to proceed.  Location: Patient: Home Provider: Office  HPI: KVON MCILHENNY is a 78 y.o. male who I last saw on 05/02/2018 with a 5.3 cm infrarenal abdominal aortic aneurysm.  I felt that given his age she would be at increased risk for open repair.  In addition he has stage III chronic kidney disease and evidence of aortic stenosis on exam.  He has ascites and cirrhosis.  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  I ordered a CT scan in 6 months and the call today was discussed those results.        Current Outpatient Medications  Medication Sig Dispense Refill  . aspirin EC 81 MG tablet Take 81 mg by mouth at bedtime.     Marland Kitchen atorvastatin (LIPITOR) 10 MG tablet Take 10 mg by mouth daily at 6 PM.    . carvedilol (COREG) 12.5 MG tablet Take 12.5 mg by mouth 2 (two) times daily.    .  cloNIDine (CATAPRES) 0.2 MG tablet Take 0.2 mg by mouth 2 (two) times daily.    . febuxostat (ULORIC) 40 MG tablet Take 40 mg by mouth daily.    Marland Kitchen levothyroxine (SYNTHROID, LEVOTHROID) 25 MCG tablet Take 25 mcg by mouth daily before breakfast.     . pantoprazole (PROTONIX) 40 MG tablet Take 1 tablet (40 mg total) by mouth daily. 90 tablet 3  . sodium bicarbonate 650 MG tablet Take 650 mg by mouth 2 (two) times daily.    . traMADol (ULTRAM) 50 MG tablet Take 50-100 mg by mouth daily as needed for moderate pain.     Marland Kitchen colchicine 0.6 MG tablet Take 0.6 mg by mouth daily as needed (gout flare).     . furosemide (LASIX) 20 MG tablet Take 20 mg by mouth daily.     No current facility-administered  medications for this visit.    REVIEW OF SYSTEMS: Valu.Nieves ] denotes positive finding; [  ] denotes negative finding  CARDIOVASCULAR:  [ ]  chest pain   [ ]  dyspnea on exertion  [ ]  leg swelling  CONSTITUTIONAL:  [ ]  fever   [ ]  chills   OBSERVATIONS/OBJECTIVE:    Vitals:   11/21/18 1037  BP: (!) 145/83  Pulse: (!) 55  Weight: 170 lb (77.1 kg)  Height: 6' (1.829 m)   Patient was alert and oriented. He did not appear short of breath on the phone.  DATA:  ARTERIAL DUPLEX: I have reviewed his arterial duplex that was done on 11/16/2018.  This showed no evidence of popliteal artery aneurysms.  CT ABDOMEN PELVIS: I reviewed his CT abdomen and pelvis that was done on 11/19/2018.  This was done without contrast.  This shows that the aneurysm has enlarged to 5.5 cm.  There is a large amount of high density mural thrombus within the dominant component of the aneurysm.  Patient has significant intra-abdominal ascites.  He also was noted to have a small pericardial effusion.  LABS: I did review his labs from 09/11/2018.  This showed a creatinine of 3.15.  GFR was 18.  MEDICAL ISSUES:  ABDOMINAL AORTIC ANEURYSM: The patient's aneurysm is enlarged to 5.5 cm.  Given the enlargement of the  aneurysm I think we should consider elective repair.  The situation is complicated by his ascites and prosthetic component to his penile implant in his abdomen.  Therefore I do not think is a good candidate for an open repair.  With his chronic kidney disease I have recommended a CO2 aortogram which is scheduled for 12/13/2018.  I also reviewed the films with Gore and hopefully he will be a candidate for endovascular approach.  I will make further recommendations pending the results of his aortogram.  We have discussed the indications for the procedure the potential complications and he is agreeable to proceed.  FOLLOW UP INSTRUCTIONS:   I discussed the assessment and treatment plan with the patient. The patient was provided an opportunity to ask questions and all were answered. The patient agreed with the plan and demonstrated an understanding of the instructions. The patient was advised to call back or seek an in-person evaluation if the symptoms worsen or if the condition fails to improve as anticipated.  I provided 25 minutes of non-face-to-face time during this encounter.   Deitra Mayo [facility]

## 2019-01-21 NOTE — Anesthesia Postprocedure Evaluation (Signed)
Anesthesia Post Note  Patient: Ricky Harris  Procedure(s) Performed: ABDOMINAL AORTIC ENDOVASCULAR STENT GRAFT WITH CO2 (N/A )     Patient location during evaluation: PACU Anesthesia Type: General Level of consciousness: sedated and patient cooperative Pain management: pain level controlled Vital Signs Assessment: post-procedure vital signs reviewed and stable Respiratory status: spontaneous breathing Cardiovascular status: stable Anesthetic complications: no    Last Vitals:  Vitals:   01/21/19 1100 01/21/19 1107  BP: 125/60 123/64  Pulse: (!) 57 (!) 59  Resp: 16 14  Temp: (!) 36.1 C   SpO2: 99% 99%    Last Pain:  Vitals:   01/21/19 1100  TempSrc:   PainSc: 0-No pain                 Nolon Nations

## 2019-01-21 NOTE — Discharge Instructions (Signed)
   [facility] [place]   Discharge Instructions  Endovascular Aortic Aneurysm Repair  Please refer to the following instructions for your post-procedure care. Your surgeon or Physician Assistant will discuss any changes with you.  Activity  You are encouraged to walk as much as you can. You can slowly return to normal activities but must avoid strenuous activity and heavy lifting until your doctor tells you it's OK. Avoid activities such as vacuuming or swinging a gold club. It is normal to feel tired for several weeks after your surgery. Do not drive until your doctor gives the OK and you are no longer taking prescription pain medications. It is also normal to have difficulty with sleep habits, eating, and bowel movements after surgery. These will go away with time.  Bathing/Showering  You may shower after you go home. If you have an incision, do not soak in a bathtub, hot tub, or swim until the incision heals completely.  Incision Care  Shower every day. Clean your incision with mild soap and water. Pat the area dry with a clean towel. You do not need a bandage unless otherwise instructed. Do not apply any ointments or creams to your incision. If you clothing is irritating, you may cover your incision with a dry gauze pad.  Diet  Resume your normal diet. There are no special food restrictions following this procedure. A low fat/low cholesterol diet is recommended for all patients with vascular disease. In order to heal from your surgery, it is CRITICAL to get adequate nutrition. Your body requires vitamins, minerals, and protein. Vegetables are the best source of vitamins and minerals. Vegetables also provide the perfect balance of protein. Processed food has little nutritional value, so try to avoid this.  Medications  Resume taking all of your medications unless your doctor or nurse practitioner tells you not to. If your incision is causing pain, you may take  over-the-counter pain relievers such as acetaminophen (Tylenol). If you were prescribed a stronger pain medication, please be aware these medications can cause nausea and constipation. Prevent nausea by taking the medication with a snack or meal. Avoid constipation by drinking plenty of fluids and eating foods with a high amount of fiber, such as fruits, vegetables, and grains. Do not take Tylenol if you are taking prescription pain medications.   Follow up  Our office will schedule a follow-up appointment with a C.T. scan 3-4 weeks after your surgery.  Please call us immediately for any of the following conditions  Severe or worsening pain in your legs or feet or in your abdomen back or chest. Increased pain, redness, drainage (pus) from your incision sit. Increased abdominal pain, bloating, nausea, vomiting or persistent diarrhea. Fever of 101 degrees or higher. Swelling in your leg (s),  Reduce your risk of vascular disease  Stop smoking. If you would like help call [facility] at 1-800-QUIT-NOW (1-800-784-8669) or [place] at [phone]. Manage your cholesterol Maintain a desired weight Control your diabetes Keep your blood pressure down  If you have questions, please call the office at [phone].   

## 2019-01-21 NOTE — Anesthesia Procedure Notes (Signed)
Arterial Line Insertion Start/End11/23/2020 6:50 AM, 01/21/2019 7:00 AM Performed by: Gracieann Stannard T, Immunologist, CRNA  Patient location: Pre-op. Preanesthetic checklist: patient identified, IV checked, risks and benefits discussed, surgical consent, monitors and equipment checked, pre-op evaluation and timeout performed Lidocaine 1% used for infiltration Right, radial was placed Catheter size: 20 G Hand hygiene performed  and maximum sterile barriers used   Attempts: 1 Procedure performed without using ultrasound guided technique. Following insertion, dressing applied and Biopatch. Post procedure assessment: normal  Patient tolerated the procedure well with no immediate complications.

## 2019-01-21 NOTE — Op Note (Signed)
NAME: Ricky Harris    MRN: [id number] DOB: Dec 17, 1940    DATE OF OPERATION: 01/21/2019  PREOP DIAGNOSIS:    5.5 cm infrarenal abdominal aortic aneurysm  POSTOP DIAGNOSIS:    Same  PROCEDURE:    Right femoral artery exposure Perclose access to left common femoral artery Endovascular aneurysm repair using 3 components  SURGEON: Judeth [place]. Scot Dock, MD  ASSIST: Laurence Slate, PA  ANESTHESIA: General  EBL: Minimal  INDICATIONS:    Ricky Harris is a 78 y.o. male with an enlarging abdominal aortic aneurysm.  This averaged 5.5 cm in diameter and given the risk of rupture elective repair was recommended.  He was felt to be a good candidate for endovascular repair although he does have stage III chronic kidney disease and also thrombocytopenia.  FINDINGS:   Completion film showed evidence of possibly a type II endoleak.  There appeared to be a good seal proximally and distally.  TECHNIQUE:   The patient was taken to the operating room and received a general anesthetic.  I looked at both groins with the SonoSite.  On the right side the patient has a penile prosthesis with a device in the right lower quadrant which was very close to where I would need to access the common femoral artery.  I felt it would be safest to do a cutdown on the right.  Therefore longitudinal incision was made in the right groin below the hardware and the dissection carried down to the superficial femoral artery which was fairly large.  I was able to get up onto the common femoral artery and controlled the deep femoral artery and 2 large branches so that ultimately I could cannulate the common femoral artery and stay below the hardware.  On the left side under ultrasound guidance, the common femoral artery was cannulated with a micropuncture needle and a micropuncture sheath introduced over a wire.  The wire was advanced into the infrarenal aorta.  Next 2 Perclose devices were positioned in the left  groin.  The initial device was rotated 10 degrees medially and the second device rotated 10 degrees laterally. A long 8 French sheath placed on the left.  On the right side the common femoral artery was accessed in a short 8 French sheath placed on the right.  On the right side the Bentson wire was advanced up into the suprarenal aorta using a Kumpe catheter.  I then exchanged the Bentson wire for an Amplatz wire on the right.  This was marked to be sure that it did not advance too far proximally beyond the arch.  I advanced the 55 French sheath up the Amplatz wire on the right without difficulty.  On the left side the Bentson wire was exchanged for an Amplatz wire using a Kumpe catheter.  The long 4 French sheath was advanced up the left side.  A pigtail catheter was then placed at the top of the L2 vertebral body.  The trunk if C component was placed up the right side.  This was done to the 16 [place] sheath.  This was a 26 mm x 14 mm x 12 cm device.  This was positioned with the gait angled anteriorly and this was positioned at the level of the lowest right renal artery.  A CO2 arteriogram was obtained using the appropriate angulation to demonstrate the neck.  The proximal graft was deployed in excellent position just below the renal arteries and CO2 follow-up arteriogram confirmed this.  I then  cannulated the contralateral gate using an angled Glidewire and a directional catheter.  The Amplatz wire was advanced through the graft and then a pigtail catheter was placed up the left side and the wire retracted.  I then turned the pigtail catheter within the graft to be sure that we were intraluminal.  A retrograde CO2 arteriogram was obtained of the iliac on the left in an RAO projection to demonstrate the position of the hypogastric artery.  On the left side the extension was a 16 mm x 13-1/2 cm device.  This was deployed without difficulty.  Next the main body was deployed and then we did a retrograde CO2  arteriogram on the right side and an LAO projection and selected a 16 mm x 9.5 cm device.  This was positioned above the hypogastric artery and deployed without difficulty.  Next balloon angioplasty was performed of the proximal neck the graft and the distal landing zones.  Addended an arteriogram using a total of 14 cc of contrast.  There appeared to be good positioning of the graft with no evidence of obvious type I endoleak however there appeared to be a significant type II endoleak.  To be sure I reballooned the proximal aspect of the graft.  We repeated the arteriogram with 8 cc of contrast and there appeared to be evidence of a type II endoleak but no type I endoleak.  Thus a total of 22 cc of contrast was used.  On the right side hemostasis was obtained in the wound.  The deep layer was closed with running 2-0 Vicryl.  The subcutaneous layer was closed with 3-0 Vicryl and the skin closed with 4-0 Vicryl.  On the left side the Bentson wire was placed up the left.  The sheath was removed and the Perclose devices secured and there was good hemostasis.  These were then secured and clamped for hemostasis the sutures were then cut and this incision was closed with a 4-0 Vicryl.  The patient tolerated the procedure well was transferred to the recovery room in stable condition.  All needle and sponge counts were correct.  Ricky Mayo, MD, FACS [facility]  DATE OF DICTATION:   01/21/2019

## 2019-01-21 NOTE — Progress Notes (Signed)
   VASCULAR SURGERY POSTOP:   Doing well postop  Anticipate D/C in AM  F/U PLT and BMET in AM   SUBJECTIVE:   No complaints  PHYSICAL EXAM:   Vitals:   01/21/19 1032 01/21/19 1047 01/21/19 1100 01/21/19 1107  BP: 121/60 (!) 124/59 125/60 123/64  Pulse: (!) 56 (!) 54 (!) 57 (!) 59  Resp: 17 19 16 14   Temp: (!) 97 F (36.1 C)  (!) 97 F (36.1 C)   TempSrc:      SpO2: 100% 100% 99% 99%  Weight:      Height:       Groins sites look fine Palpable pedal pulses  LABS:   Lab Results  Component Value Date   WBC 5.5 01/21/2019   HGB 9.3 (L) 01/21/2019   HCT 29.1 (L) 01/21/2019   MCV 104.7 (H) 01/21/2019   PLT 81 (L) 01/21/2019   Lab Results  Component Value Date   CREATININE 3.68 (H) 01/21/2019   Lab Results  Component Value Date   INR 1.4 (H) 01/21/2019   CBG (last 3)  Recent Labs    01/21/19 0606  GLUCAP 93    PROBLEM LIST:    Active Problems:   Abdominal aortic aneurysm (AAA) ([facility])   AAA (abdominal aortic aneurysm) without rupture ([facility])   CURRENT MEDS:   . sodium chloride   Intravenous Once  . [START ON 01/22/2019] amLODipine  5 mg Oral Daily  . atorvastatin  10 mg Oral q1800  . carvedilol  12.5 mg Oral BID  . cloNIDine  0.2 mg Oral BID  . [START ON 01/22/2019] docusate sodium  100 mg Oral Daily  . [START ON 01/22/2019] enoxaparin (LOVENOX) injection  30 mg Subcutaneous Q24H  . febuxostat  40 mg Oral Daily  . levothyroxine  25 mcg Oral Q0600  . pantoprazole  40 mg Oral Daily  . sodium bicarbonate  650 mg Oral BID    Deitra Mayo Office: [phone] 01/21/2019

## 2019-01-21 NOTE — Transfer of Care (Signed)
Immediate Anesthesia Transfer of Care Note  Patient: Ricky Harris  Procedure(s) Performed: ABDOMINAL AORTIC ENDOVASCULAR STENT GRAFT WITH CO2 (N/A )  Patient Location: PACU  Anesthesia Type:General  Level of Consciousness: drowsy  Airway & Oxygen Therapy: Patient Spontanous Breathing and Patient connected to nasal cannula oxygen  Post-op Assessment: Report given to RN, Post -op Vital signs reviewed and stable and Patient moving all extremities  Post vital signs: Reviewed and stable  Last Vitals:  Vitals Value Taken Time  BP 121/60 01/21/19 1032  Temp 36.1 C 01/21/19 1032  Pulse 56 01/21/19 1032  Resp 17 01/21/19 1032  SpO2 100 % 01/21/19 1032    Last Pain:  Vitals:   01/21/19 1032  TempSrc:   PainSc: 0-No pain         Complications: No apparent anesthesia complications

## 2019-01-21 NOTE — Anesthesia Procedure Notes (Signed)
Procedure Name: Intubation Date/Time: 01/21/2019 7:46 AM Performed by: Saragrace Selke T, CRNA Pre-anesthesia Checklist: Patient identified, Emergency Drugs available, Suction available and Patient being monitored Patient Re-evaluated:Patient Re-evaluated prior to induction Oxygen Delivery Method: Circle system utilized Preoxygenation: Pre-oxygenation with 100% oxygen Induction Type: IV induction Ventilation: Mask ventilation without difficulty Laryngoscope Size: Miller and 3 Grade View: Grade I Tube type: Oral Tube size: 7.5 mm Number of attempts: 1 Airway Equipment and Method: Patient positioned with wedge pillow and Stylet Placement Confirmation: ETT inserted through vocal cords under direct vision,  positive ETCO2 and breath sounds checked- equal and bilateral Secured at: 22 cm Tube secured with: Tape Dental Injury: Teeth and Oropharynx as per pre-operative assessment

## 2019-01-21 NOTE — Progress Notes (Signed)
01/21/2019 1140 Received pt to room 4E21 from PACU.  Pt is A&O.  No C/O voiced.  Tele monitor applied and CCMD notified.  CHG bath given.  Both R & L groin sites are level zero.  Pt instructed on bedrest until 220.  Pt voiced understanding.  Oriented to room, call light and bed.  Call bell in reach.   Carney Corners

## 2019-01-22 ENCOUNTER — Other Ambulatory Visit: Payer: Self-pay | Admitting: *Deleted

## 2019-01-22 ENCOUNTER — Encounter (HOSPITAL_COMMUNITY): Payer: Medicare Other

## 2019-01-22 ENCOUNTER — Telehealth: Payer: Self-pay | Admitting: *Deleted

## 2019-01-22 DIAGNOSIS — N1832 Chronic kidney disease, stage 3b: Secondary | ICD-10-CM

## 2019-01-22 LAB — PREPARE PLATELET PHERESIS: Unit division: 0

## 2019-01-22 LAB — TYPE AND SCREEN
ABO/RH(D): AB POS
Antibody Screen: NEGATIVE

## 2019-01-22 LAB — BPAM PLATELET PHERESIS
Blood Product Expiration Date: 202011232359
ISSUE DATE / TIME: 202011230827
Unit Type and Rh: 6200

## 2019-01-22 LAB — CBC
HCT: 28.1 % — ABNORMAL LOW (ref 39.0–52.0)
Hemoglobin: 8.9 g/dL — ABNORMAL LOW (ref 13.0–17.0)
MCH: 33.5 pg (ref 26.0–34.0)
MCHC: 31.7 g/dL (ref 30.0–36.0)
MCV: 105.6 fL — ABNORMAL HIGH (ref 80.0–100.0)
Platelets: 73 10*3/uL — ABNORMAL LOW (ref 150–400)
RBC: 2.66 MIL/uL — ABNORMAL LOW (ref 4.22–5.81)
RDW: 15.9 % — ABNORMAL HIGH (ref 11.5–15.5)
WBC: 5.5 10*3/uL (ref 4.0–10.5)
nRBC: 0 % (ref 0.0–0.2)

## 2019-01-22 LAB — POCT ACTIVATED CLOTTING TIME
Activated Clotting Time: 180 seconds
Activated Clotting Time: 191 seconds
Activated Clotting Time: 219 seconds

## 2019-01-22 LAB — BASIC METABOLIC PANEL
Anion gap: 7 (ref 5–15)
BUN: 68 mg/dL — ABNORMAL HIGH (ref 8–23)
CO2: 14 mmol/L — ABNORMAL LOW (ref 22–32)
Calcium: 7.7 mg/dL — ABNORMAL LOW (ref 8.9–10.3)
Chloride: 116 mmol/L — ABNORMAL HIGH (ref 98–111)
Creatinine, Ser: 4.02 mg/dL — ABNORMAL HIGH (ref 0.61–1.24)
GFR calc Af Amer: 16 mL/min — ABNORMAL LOW (ref >60)
GFR calc non Af Amer: 14 mL/min — ABNORMAL LOW (ref >60)
Glucose, Bld: 131 mg/dL — ABNORMAL HIGH (ref 70–99)
Potassium: 5.9 mmol/L — ABNORMAL HIGH (ref 3.5–5.1)
Sodium: 137 mmol/L (ref 135–145)

## 2019-01-22 MED ORDER — ASPIRIN EC 81 MG PO TBEC
81.0000 mg | DELAYED_RELEASE_TABLET | Freq: Every day | ORAL | Status: DC
  Administered 2019-01-22: 09:00:00 81 mg via ORAL
  Filled 2019-01-22: qty 1

## 2019-01-22 MED ORDER — TRAMADOL HCL 50 MG PO TABS: 50.0000 mg | ORAL_TABLET | Freq: Every day | ORAL | 0 refills | Status: DC | PRN

## 2019-01-22 MED ORDER — SODIUM POLYSTYRENE SULFONATE 15 GM/60ML PO SUSP
30.0000 g | Freq: Once | ORAL | Status: AC
  Administered 2019-01-22: 30 g via ORAL
  Filled 2019-01-22: qty 120

## 2019-01-22 MED ORDER — ASPIRIN 81 MG PO TBEC: 81.0000 mg | DELAYED_RELEASE_TABLET | Freq: Every day | ORAL | Status: DC

## 2019-01-22 NOTE — Progress Notes (Addendum)
  Progress Note  VASCULAR SURGERY ASSESSMENT & PLAN:   POD 1 - EVAR: Agree with plans for discharge today on aspirin and a statin.  CHRONIC KIDNEY DISEASE: He will need a follow-up BMET early next week.  I will discuss hemodialysis access with the patient when he comes in for his follow-up visit.  He will need a vein map at that time which I have notified the office about.  I have already arranged his follow-up CT without contrast in 1 month with an office visit at that time.  Deitra Mayo, MD Office: [phone]   01/22/2019 7:27 AM 1 Day Post-Op  Subjective:  Denies abd or back pain today.  Has not been OOB yet this morning   Vitals:   01/21/19 2314 01/22/19 0443  BP: 118/65 129/65  Pulse:  (!) 56  Resp: 20 15  Temp: 97.7 F (36.5 C) 98.2 F (36.8 C)  SpO2: 100% 100%   Physical Exam: Lungs:  Non labored Incisions:  R groin incision c/d/i; L groin without hematoma Extremities:  Palpable DP BLE Abdomen:  Soft, nontender, ND Neurologic: A&O  CBC    Component Value Date/Time   WBC 5.5 01/22/2019 0440   RBC 2.66 (L) 01/22/2019 0440   HGB 8.9 (L) 01/22/2019 0440   HCT 28.1 (L) 01/22/2019 0440   PLT 73 (L) 01/22/2019 0440   MCV 105.6 (H) 01/22/2019 0440   MCH 33.5 01/22/2019 0440   MCHC 31.7 01/22/2019 0440   RDW 15.9 (H) 01/22/2019 0440   LYMPHSABS 0.9 05/20/2017 0051   MONOABS 0.4 05/20/2017 0051   EOSABS 0.2 05/20/2017 0051   BASOSABS 0.0 05/20/2017 0051    BMET    Component Value Date/Time   NA 137 01/22/2019 0440   K 5.9 (H) 01/22/2019 0440   CL 116 (H) 01/22/2019 0440   CO2 14 (L) 01/22/2019 0440   GLUCOSE 131 (H) 01/22/2019 0440   BUN 68 (H) 01/22/2019 0440   CREATININE 4.02 (H) 01/22/2019 0440   CALCIUM 7.7 (L) 01/22/2019 0440   GFRNONAA 14 (L) 01/22/2019 0440   GFRAA 16 (L) 01/22/2019 0440    INR    Component Value Date/Time   INR 1.4 (H) 01/21/2019 1031     Intake/Output Summary (Last 24 hours) at 01/22/2019 0727 Last data filed  at 01/22/2019 2800 Gross per 24 hour  Intake 2637.28 ml  Output 880 ml  Net 1757.28 ml     Assessment/Plan:  78 y.o. male is s/p Endovascular repair of AAA with R CFA exposure 1 Day Post-Op   Perfusing BLE with palpable DP pulses CKD: Cr increased 3.7 to 4; foley already removed this morning; continue gentle IVF;  Hyperkalemia: kayexolate ordered Possible discharge this afternoon if good urine output and mobility increased   Dagoberto Ligas, PA-C [facility] [phone] 01/22/2019 7:27 AM

## 2019-01-22 NOTE — Progress Notes (Signed)
01/22/2019 1000 Pt ambulated without assistance on RA 470 ft.  Pt tolerated well.  He has already voided good amounts since the foley has been removed. Carney Corners

## 2019-01-22 NOTE — Discharge Summary (Signed)
EVAR Discharge Summary   Ricky Harris 23-Nov-1940 78 y.o. male  MRN: [id number]  Admission Date: 01/21/2019  Discharge Date: 01/22/19  Physician: Dr. Scot Dock  Admission Diagnosis: AAA (abdominal aortic aneurysm) without rupture [facility]) [I71.4]  Discharge Day services:    see progress note 01/22/19 Physical Exam: Vitals:   01/22/19 0700 01/22/19 0907  BP: 140/69   Pulse: (!) 59 60  Resp: 17   Temp: 98.6 F (37 C)   SpO2: 100%     Hospital Course:  The patient was admitted to the hospital and taken to the operating room on 01/21/2019 and underwent: Endovascular repair of abdominal aortic aneurysm with right common femoral artery exposure by Dr. Scot Dock.    The pt tolerated the procedure well and was transported to the PACU in good condition.   POD#1 the patient was ready for discharge home.  He had palpable pedal pulses and was ambulating without difficulty.  Past medical history also significant for chronic kidney disease followed by Dr. Justin Mend.  Minimal contrast was used during surgery however he will require a basic metabolic panel early next week to follow renal function.  He will be seen in office by Dr. Scot Dock in 4 weeks with a noncontrast abdomen/pelvis CT scan.  He will also have a vein mapping duplex performed to discuss dialysis access.  He was prescribed 2 to 3 days of narcotic pain medication for continued postoperative pain control.  Discharge instructions were reviewed with the patient and he voices understanding.  He will be discharged this morning in stable condition.   CBC    Component Value Date/Time   WBC 5.5 01/22/2019 0440   RBC 2.66 (L) 01/22/2019 0440   HGB 8.9 (L) 01/22/2019 0440   HCT 28.1 (L) 01/22/2019 0440   PLT 73 (L) 01/22/2019 0440   MCV 105.6 (H) 01/22/2019 0440   MCH 33.5 01/22/2019 0440   MCHC 31.7 01/22/2019 0440   RDW 15.9 (H) 01/22/2019 0440   LYMPHSABS 0.9 05/20/2017 0051   MONOABS 0.4 05/20/2017 0051   EOSABS 0.2 05/20/2017  0051   BASOSABS 0.0 05/20/2017 0051    BMET    Component Value Date/Time   NA 137 01/22/2019 0440   K 5.9 (H) 01/22/2019 0440   CL 116 (H) 01/22/2019 0440   CO2 14 (L) 01/22/2019 0440   GLUCOSE 131 (H) 01/22/2019 0440   BUN 68 (H) 01/22/2019 0440   CREATININE 4.02 (H) 01/22/2019 0440   CALCIUM 7.7 (L) 01/22/2019 0440   GFRNONAA 14 (L) 01/22/2019 0440   GFRAA 16 (L) 01/22/2019 0440         Discharge Diagnosis:  AAA (abdominal aortic aneurysm) without rupture (HCC) [I71.4]  Secondary Diagnosis: Patient Active Problem List   Diagnosis Date Noted   Abdominal aortic aneurysm (AAA) ([place]) 01/21/2019   AAA (abdominal aortic aneurysm) without rupture ([place]) 01/21/2019   Ascites 05/20/2017   Normochromic normocytic anemia 05/20/2017   Essential hypertension 05/20/2017   CKD (chronic kidney disease) stage 3, GFR 30-59 ml/min 05/20/2017   HLD (hyperlipidemia) 05/20/2017   Hypothyroidism 05/20/2017   Past Medical History:  Diagnosis Date   AAA (abdominal aortic aneurysm) ([place])    5.3cm , magd by vascular Dr Scot Dock ,   Aortic stenosis    Arthritis    knees   Ascites    Cancer ([place])    prostate   Chronic kidney disease    stage 4, mgd by Dr Justin Mend    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 Notes to patient: Take tomorrow AM 01/23/2019   aspirin 81 MG EC tablet Take 1 tablet (81 mg total) by mouth  daily. Notes to patient: Take tomorrow AM 01/23/2019   atorvastatin 10 MG tablet Commonly known as: LIPITOR Take 10 mg by mouth daily at 6 PM.   carvedilol 12.5 MG tablet Commonly known as: COREG Take 12.5 mg by mouth 2 (two) times daily.   cloNIDine 0.2 MG tablet Commonly known as: CATAPRES Take 0.2 mg by mouth 2 (two) times daily.   colchicine 0.6 MG tablet Take 0.6 mg by mouth daily as needed (gout flare). Notes to patient: Take as needed for gout 01/22/2019   febuxostat 40 MG tablet Commonly known as: ULORIC Take 40 mg by mouth daily. Notes to patient: Take tomorrow AM 01/23/2019   levothyroxine 25 MCG tablet Commonly known as: SYNTHROID Take 25 mcg by mouth daily before breakfast. Notes to patient: Take tomorrow AM before breakfast 01/23/2019   Multivitamin Gummies Adult Chew Chew 2 capsules by mouth daily.   pantoprazole 40 MG tablet Commonly known as: Protonix Take 1 tablet (40 mg total) by mouth daily. Notes to patient: Take tomorrow AM 01/23/2019   sodium bicarbonate 650 MG tablet Take 650 mg by mouth 2 (two) times daily.   traMADol 50 MG tablet Commonly known as: ULTRAM Take 1-2 tablets (50-100 mg total) by mouth daily as needed for moderate pain. Notes to patient: Take as needed 01/22/2019       Discharge Instructions:   [facility]  Discharge Instructions Endovascular Aortic Aneurysm Repair  Please refer to the following instructions for your post-procedure care. Your surgeon or Physician Assistant will discuss any changes with you.  Activity  You are encouraged to walk as much as you can. You can slowly return to normal activities but must avoid strenuous activity and heavy lifting until your doctor tells you it's OK. Avoid activities such as vacuuming or swinging a gold club. It is normal to feel tired for several weeks after your surgery. Do not drive until your doctor gives the OK and you are no longer taking  prescription pain medications. It is also normal to have difficulty with sleep habits, eating, and bowel movements after surgery. These will go away with time.  Bathing/Showering  You may shower after you go home. If you have an incision, do not soak in a bathtub, hot tub, or swim until the incision heals completely.  Incision Care  Shower every day. Clean your incision with mild soap and water. Pat the area dry with a clean towel. You do not need a bandage unless otherwise instructed. Do not apply any ointments or creams to your incision. If you clothing is irritating, you may cover  your incision with a dry gauze pad.  Diet  Resume your normal diet. There are no special food restrictions following this procedure. A low fat/low cholesterol diet is recommended for all patients with vascular disease. In order to heal from your surgery, it is CRITICAL to get adequate nutrition. Your body requires vitamins, minerals, and protein. Vegetables are the best source of vitamins and minerals. Vegetables also provide the perfect balance of protein. Processed food has little nutritional value, so try to avoid this.  Medications  Resume taking all of your medications unless your doctor or Physician Assistnat tells you not to. If your incision is causing pain, you may take over-the-counter pain relievers such as acetaminophen (Tylenol). If you were prescribed a stronger pain medication, please be aware these medications can cause nausea and constipation. Prevent nausea by taking the medication with a snack or meal. Avoid constipation by drinking plenty of fluids and eating foods with a high amount of fiber, such as fruits, vegetables, and grains. Do not take Tylenol if you are taking prescription pain medications.   Follow up  [place] office will schedule a follow-up appointment with a C.T. scan 3-4 weeks after your surgery.  Please call us immediately for any of the following conditions  Severe or worsening  pain in your legs or feet or in your abdomen back or chest. Increased pain, redness, drainage (pus) from your incision sit. Increased abdominal pain, bloating, nausea, vomiting or persistent diarrhea. Fever of 101 degrees or higher. Swelling in your leg (s),  Reduce your risk of vascular disease  Stop smoking. If you would like help call [facility] at 1-800-QUIT-NOW 4352356632) or [place] at ([phone]. Manage your cholesterol Maintain a desired weight Control your diabetes Keep your blood pressure down  If you have questions, please call the office at [phone].    Prescriptions given: Tramadol 50mg  q6h prn  Disposition: home  Patient's condition: is Good  Follow up: 1. Dr. Scot Dock in 4 weeks with noncontrast CT abdomen/pelvis   Dagoberto Ligas, PA-C [facility] [phone] 01/22/2019  3:42 PM   - For VQI Registry use - Post-op:  Time to Extubation: [x]  In OR, [ ]  < 12 hrs, [ ]  12-24 hrs, [ ]  >=24 hrs Vasopressors Req. Post-op: No MI: No., [ ]  Troponin only, [ ]  EKG or Clinical New Arrhythmia: No CHF: No ICU Stay: 0 days Transfusion: No      Complications: Resp failure: No., [ ]  Pneumonia, [ ]  Ventilator Chg in renal function: No., [ ]  Inc. Cr > 0.5, [ ]  Temp. Dialysis,  [ ]  Permanent dialysis Leg ischemia: No., no Surgery needed, [ ]  Yes, Surgery needed,  [ ]  Amputation Bowel ischemia: No., [ ]  Medical Rx, [ ]  Surgical Rx Wound complication: No., [ ]  Superficial separation/infection, [ ]  Return to OR Return to OR: No  Return to OR for bleeding: No Stroke: No., [ ]  Minor, [ ]  Major  Discharge medications: Statin use:  Yes  ASA use:  Yes  Plavix use:  No  Beta blocker use:  Yes  ARB use:  No ACEI use:  No CCB use:  Yes

## 2019-01-22 NOTE — Telephone Encounter (Signed)
Orders were placed in Epic and this message was sent to the Adm Pool today

## 2019-01-22 NOTE — Progress Notes (Signed)
01/22/2019 11:15 AM Discharge AVS meds taken today and those due this evening reviewed.  Follow-up appointments and when to call md reviewed.  D/C IV and TELE.  Questions and concerns addressed.   D/C home per orders. Carney Corners

## 2019-01-22 NOTE — Telephone Encounter (Signed)
-----   Message from Angelia Mould, MD sent at 01/22/2019  7:54 AM EST ----- Regarding: f/u When this patient comes in for his 1 month follow-up visit after EVAR he will need bilateral upper extremity vein map as he needs to be evaluated for hemodialysis access.  He also needs a follow-up BMET early next week.   Thank you. CD

## 2019-01-23 ENCOUNTER — Encounter (HOSPITAL_COMMUNITY): Payer: Self-pay | Admitting: Vascular Surgery

## 2019-01-28 ENCOUNTER — Other Ambulatory Visit (HOSPITAL_COMMUNITY): Payer: Self-pay | Admitting: Gastroenterology

## 2019-01-28 DIAGNOSIS — K7460 Unspecified cirrhosis of liver: Secondary | ICD-10-CM

## 2019-01-28 DIAGNOSIS — R188 Other ascites: Secondary | ICD-10-CM

## 2019-01-29 ENCOUNTER — Other Ambulatory Visit: Payer: Self-pay

## 2019-01-29 ENCOUNTER — Encounter (HOSPITAL_COMMUNITY)
Admission: RE | Admit: 2019-01-29 | Discharge: 2019-01-29 | Disposition: A | Discharge status: Home / Self Care | Payer: Medicare Other | Source: Ambulatory Visit | Attending: Nephrology | Admitting: Nephrology

## 2019-01-29 VITALS — BP 113/60 | HR 58 | Temp 97.3°F | Resp 16 | Ht 72.0 in | Wt 180.0 lb

## 2019-01-29 DIAGNOSIS — N183 Chronic kidney disease, stage 3 unspecified: Secondary | ICD-10-CM | POA: Diagnosis present

## 2019-01-29 LAB — POCT HEMOGLOBIN-HEMACUE: Hemoglobin: 8.4 g/dL — ABNORMAL LOW (ref 13.0–17.0)

## 2019-01-29 MED ORDER — DARBEPOETIN ALFA 150 MCG/0.3ML IJ SOSY
PREFILLED_SYRINGE | INTRAMUSCULAR | Status: AC
  Administered 2019-01-29: 150 ug via SUBCUTANEOUS
  Filled 2019-01-29: qty 0.3

## 2019-01-29 MED ORDER — DARBEPOETIN ALFA 100 MCG/0.5ML IJ SOSY: 150.0000 ug | PREFILLED_SYRINGE | INTRAMUSCULAR | Status: DC

## 2019-01-30 ENCOUNTER — Encounter (HOSPITAL_COMMUNITY): Payer: Self-pay | Admitting: Student

## 2019-01-30 ENCOUNTER — Other Ambulatory Visit: Payer: Self-pay

## 2019-01-30 ENCOUNTER — Ambulatory Visit (HOSPITAL_COMMUNITY)
Admission: RE | Admit: 2019-01-30 | Discharge: 2019-01-30 | Disposition: A | Discharge status: Home / Self Care | Payer: Medicare Other | Source: Ambulatory Visit | Attending: Gastroenterology | Admitting: Gastroenterology

## 2019-01-30 DIAGNOSIS — R188 Other ascites: Secondary | ICD-10-CM | POA: Insufficient documentation

## 2019-01-30 DIAGNOSIS — K7460 Unspecified cirrhosis of liver: Secondary | ICD-10-CM | POA: Diagnosis not present

## 2019-01-30 HISTORY — PX: IR PARACENTESIS: IMG2679

## 2019-01-30 MED ORDER — ALBUMIN HUMAN 25 % IV SOLN
INTRAVENOUS | Status: AC
  Filled 2019-01-30: qty 100

## 2019-01-30 MED ORDER — LIDOCAINE HCL 1 % IJ SOLN
INTRAMUSCULAR | Status: AC
  Filled 2019-01-30: qty 20

## 2019-01-30 MED ORDER — LIDOCAINE HCL (PF) 1 % IJ SOLN
INTRAMUSCULAR | Status: DC | PRN
  Administered 2019-01-30: 10 mL

## 2019-01-30 MED ORDER — ALBUMIN HUMAN 25 % IV SOLN
50.0000 g | Freq: Once | INTRAVENOUS | Status: AC
  Administered 2019-01-30: 10:00:00 50 g via INTRAVENOUS

## 2019-01-30 MED ORDER — ALBUMIN HUMAN 25 % IV SOLN
INTRAVENOUS | Status: AC
  Administered 2019-01-30: 50 g via INTRAVENOUS
  Filled 2019-01-30: qty 100

## 2019-01-30 NOTE — Procedures (Signed)
PROCEDURE SUMMARY:  Successful US guided paracentesis from right lateral abdomen.  Yielded 6.1 liters of yellow fluid.  No immediate complications.  Pt tolerated well.   Specimen was not sent for labs.  EBL < 43mL  Docia Barrier PA-C 01/30/2019 3:59 PM

## 2019-02-05 ENCOUNTER — Encounter (HOSPITAL_COMMUNITY): Payer: Self-pay | Admitting: Physician Assistant

## 2019-02-05 ENCOUNTER — Other Ambulatory Visit: Payer: Self-pay

## 2019-02-05 ENCOUNTER — Other Ambulatory Visit (HOSPITAL_COMMUNITY): Payer: Self-pay | Admitting: Gastroenterology

## 2019-02-05 ENCOUNTER — Ambulatory Visit (HOSPITAL_COMMUNITY)
Admission: RE | Admit: 2019-02-05 | Discharge: 2019-02-05 | Disposition: A | Discharge status: Home / Self Care | Payer: Medicare Other | Source: Ambulatory Visit | Attending: Gastroenterology | Admitting: Gastroenterology

## 2019-02-05 ENCOUNTER — Ambulatory Visit (HOSPITAL_COMMUNITY): Payer: Medicare Other

## 2019-02-05 DIAGNOSIS — R188 Other ascites: Secondary | ICD-10-CM | POA: Insufficient documentation

## 2019-02-05 HISTORY — PX: IR PARACENTESIS: IMG2679

## 2019-02-05 MED ORDER — ALBUMIN HUMAN 25 % IV SOLN
50.0000 g | Freq: Once | INTRAVENOUS | Status: AC
  Administered 2019-02-05: 50 g via INTRAVENOUS

## 2019-02-05 MED ORDER — LIDOCAINE HCL 1 % IJ SOLN
INTRAMUSCULAR | Status: AC
  Filled 2019-02-05: qty 20

## 2019-02-05 MED ORDER — ALBUMIN HUMAN 25 % IV SOLN
INTRAVENOUS | Status: AC
  Filled 2019-02-05: qty 200

## 2019-02-05 MED ORDER — LIDOCAINE HCL 1 % IJ SOLN
INTRAMUSCULAR | Status: DC | PRN
  Administered 2019-02-05: 10 mL

## 2019-02-05 NOTE — Procedures (Signed)
PROCEDURE SUMMARY:  Successful image-guided paracentesis from the left lower abdomen.  Yielded 5.2 liters of clear, light yellow fluid.  No immediate complications.  EBL: zero Patient tolerated well.   Specimen was not sent for labs.  Please see imaging section of Epic for full dictation.  Joaquim Nam PA-C 02/05/2019 1:13 PM

## 2019-02-12 ENCOUNTER — Ambulatory Visit (HOSPITAL_COMMUNITY)
Admission: RE | Admit: 2019-02-12 | Discharge: 2019-02-12 | Disposition: A | Discharge status: Home / Self Care | Payer: Medicare Other | Source: Ambulatory Visit | Attending: Nephrology | Admitting: Nephrology

## 2019-02-12 ENCOUNTER — Other Ambulatory Visit: Payer: Self-pay

## 2019-02-12 ENCOUNTER — Other Ambulatory Visit (HOSPITAL_COMMUNITY): Payer: Self-pay | Admitting: Gastroenterology

## 2019-02-12 VITALS — BP 129/69 | HR 56 | Temp 98.1°F | Resp 18

## 2019-02-12 DIAGNOSIS — N183 Chronic kidney disease, stage 3 unspecified: Secondary | ICD-10-CM | POA: Diagnosis not present

## 2019-02-12 DIAGNOSIS — K7460 Unspecified cirrhosis of liver: Secondary | ICD-10-CM

## 2019-02-12 DIAGNOSIS — D631 Anemia in chronic kidney disease: Secondary | ICD-10-CM | POA: Diagnosis not present

## 2019-02-12 LAB — IRON AND TIBC
Iron: 55 ug/dL (ref 45–182)
Saturation Ratios: 25 % (ref 17.9–39.5)
TIBC: 223 ug/dL — ABNORMAL LOW (ref 250–450)
UIBC: 168 ug/dL

## 2019-02-12 LAB — POCT HEMOGLOBIN-HEMACUE: Hemoglobin: 9.3 g/dL — ABNORMAL LOW (ref 13.0–17.0)

## 2019-02-12 LAB — FERRITIN: Ferritin: 65 ng/mL (ref 24–336)

## 2019-02-12 MED ORDER — DARBEPOETIN ALFA 100 MCG/0.5ML IJ SOSY: 150.0000 ug | PREFILLED_SYRINGE | INTRAMUSCULAR | Status: DC

## 2019-02-12 MED ORDER — DARBEPOETIN ALFA 150 MCG/0.3ML IJ SOSY
PREFILLED_SYRINGE | INTRAMUSCULAR | Status: AC
  Administered 2019-02-12: 150 ug
  Filled 2019-02-12: qty 0.3

## 2019-02-14 ENCOUNTER — Other Ambulatory Visit: Payer: Self-pay

## 2019-02-14 ENCOUNTER — Ambulatory Visit (HOSPITAL_COMMUNITY)
Admission: RE | Admit: 2019-02-14 | Discharge: 2019-02-14 | Disposition: A | Discharge status: Home / Self Care | Payer: Medicare Other | Source: Ambulatory Visit | Attending: Gastroenterology | Admitting: Gastroenterology

## 2019-02-14 DIAGNOSIS — K7460 Unspecified cirrhosis of liver: Secondary | ICD-10-CM | POA: Insufficient documentation

## 2019-02-14 DIAGNOSIS — R188 Other ascites: Secondary | ICD-10-CM | POA: Diagnosis not present

## 2019-02-14 HISTORY — PX: IR PARACENTESIS: IMG2679

## 2019-02-14 MED ORDER — LIDOCAINE HCL 1 % IJ SOLN
INTRAMUSCULAR | Status: AC
  Filled 2019-02-14: qty 20

## 2019-02-14 MED ORDER — LIDOCAINE HCL 1 % IJ SOLN
INTRAMUSCULAR | Status: DC | PRN
  Administered 2019-02-14: 10 mL

## 2019-02-14 MED ORDER — ALBUMIN HUMAN 25 % IV SOLN
INTRAVENOUS | Status: AC
  Administered 2019-02-14: 09:00:00 50 g via INTRAVENOUS
  Filled 2019-02-14: qty 200

## 2019-02-14 MED ORDER — ALBUMIN HUMAN 25 % IV SOLN: 50.0000 g | Freq: Once | INTRAVENOUS | Status: AC

## 2019-02-14 NOTE — Procedures (Signed)
PROCEDURE SUMMARY:  Successful US guided paracentesis from left lateral abdomen.  Yielded 5.1 liters of clear yellow fluid.  No immediate complications.  Patient tolerated well.  EBL = trace  Maicol Bowland S Petra Sargeant PA-C 02/14/2019 10:04 AM

## 2019-02-20 ENCOUNTER — Ambulatory Visit (INDEPENDENT_AMBULATORY_CARE_PROVIDER_SITE_OTHER): Payer: Self-pay | Admitting: Vascular Surgery

## 2019-02-20 ENCOUNTER — Encounter: Payer: Self-pay | Admitting: Vascular Surgery

## 2019-02-20 ENCOUNTER — Other Ambulatory Visit: Payer: Self-pay

## 2019-02-20 ENCOUNTER — Ambulatory Visit (INDEPENDENT_AMBULATORY_CARE_PROVIDER_SITE_OTHER)
Admit: 2019-02-20 | Discharge: 2019-02-20 | Disposition: A | Discharge status: Home / Self Care | Payer: Medicare Other | Attending: Vascular Surgery | Admitting: Vascular Surgery

## 2019-02-20 ENCOUNTER — Ambulatory Visit (HOSPITAL_COMMUNITY)
Admission: RE | Admit: 2019-02-20 | Discharge: 2019-02-20 | Disposition: A | Discharge status: Home / Self Care | Payer: Medicare Other | Source: Ambulatory Visit | Attending: Vascular Surgery | Admitting: Vascular Surgery

## 2019-02-20 VITALS — BP 123/67 | HR 61 | Temp 97.7°F | Resp 20 | Ht 72.0 in | Wt 179.0 lb

## 2019-02-20 DIAGNOSIS — N186 End stage renal disease: Secondary | ICD-10-CM

## 2019-02-20 DIAGNOSIS — I714 Abdominal aortic aneurysm, without rupture, unspecified: Secondary | ICD-10-CM

## 2019-02-20 DIAGNOSIS — N185 Chronic kidney disease, stage 5: Secondary | ICD-10-CM

## 2019-02-20 DIAGNOSIS — Z48812 Encounter for surgical aftercare following surgery on the circulatory system: Secondary | ICD-10-CM

## 2019-02-20 DIAGNOSIS — N1832 Chronic kidney disease, stage 3b: Secondary | ICD-10-CM

## 2019-02-20 DIAGNOSIS — Z992 Dependence on renal dialysis: Secondary | ICD-10-CM

## 2019-02-20 NOTE — Progress Notes (Signed)
Patient name: Ricky Harris MRN: [id number] DOB: 03/21/1940 Sex: male  REASON FOR VISIT:   Follow-up after endovascular repair of aneurysm and to evaluate for hemodialysis access  HPI:   Ricky Harris is a pleasant 78 y.o. male who presented with an enlarging abdominal aortic aneurysm.  This was 5.5 cm in maximum diameter elective repair was recommended.  The situation was complicated by his chronic kidney disease and also history of thrombocytopenia.  On 01/21/2019 he underwent right femoral artery exposure and Perclose of the left common femoral artery in addition to endovascular aneurysm repair using 3 components.  Only 14 cc of contrast was used intraoperatively.  He is here today to be evaluated for hemodialysis access.  He is right-handed.  He denies any recent uremic symptoms.  Past Medical History:  Diagnosis Date  . AAA (abdominal aortic aneurysm) ([place])    5.3cm , magd by vascular Dr Scot Dock ,  . Aortic stenosis   . Arthritis    knees  . Ascites   . Cancer [facility])    prostate  . Chronic kidney disease    stage 4, mgd by Dr Justin Mend   . 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 Coronary artery disease    on CT scan  . Gout   . H/O hypercholesterolemia   . Heart murmur   . History of falling    recent- see ct chest results of 04/25/2018  . Hypertension   . Hypothyroidism   . Liver cirrhosis ([place])   . Prediabetes    pt denies   . Renal artery stenosis ([place])   . Thrombocytopenia ([place])   . Wears glasses     Family History  Problem Relation Age of Onset  . Diabetes Mellitus II Mother   . Stroke Mother     SOCIAL HISTORY: Social History   Tobacco Use  . Smoking status: Never Smoker  . Smokeless tobacco: Never Used  Substance Use Topics  . Alcohol use: Not Currently    Allergies  Allergen Reactions  . Cephalexin Hives  . Hydralazine Hives    Current Outpatient Medications  Medication Sig Dispense Refill  . amLODipine (NORVASC) 5 MG tablet Take 5  mg by mouth daily.    Marland Kitchen aspirin EC 81 MG EC tablet Take 1 tablet (81 mg total) by mouth daily.    Marland Kitchen atorvastatin (LIPITOR) 10 MG tablet Take 10 mg by mouth daily at 6 PM.    . carvedilol (COREG) 12.5 MG tablet Take 12.5 mg by mouth 2 (two) times daily.    . cloNIDine (CATAPRES) 0.2 MG tablet Take 0.2 mg by mouth 2 (two) times daily.    . colchicine 0.6 MG tablet Take 0.6 mg by mouth daily as needed (gout flare).     . febuxostat (ULORIC) 40 MG tablet Take 40 mg by mouth daily.    Marland Kitchen levothyroxine (SYNTHROID, LEVOTHROID) 25 MCG tablet Take 25 mcg by mouth daily before breakfast.     . Multiple Vitamins-Minerals (MULTIVITAMIN GUMMIES ADULT) CHEW Chew 2 capsules by mouth daily.    . sodium bicarbonate 650 MG tablet Take 650 mg by mouth 2 (two) times daily.    . traMADol (ULTRAM) 50 MG tablet Take 1-2 tablets (50-100 mg total) by mouth daily as needed for moderate pain. 15 tablet 0  . pantoprazole (PROTONIX) 40 MG tablet Take 1 tablet (40 mg total) by mouth daily. 90 tablet 3   No current facility-administered medications for this visit.  REVIEW OF SYSTEMS:  [X]  denotes positive finding, [ ]  denotes negative finding Cardiac  Comments:  Chest pain or chest pressure:    Shortness of breath upon exertion:    Short of breath when lying flat:    Irregular heart rhythm:        Vascular    Pain in calf, thigh, or hip brought on by ambulation:    Pain in feet at night that wakes you up from your sleep:     Blood clot in your veins:    Leg swelling:         Pulmonary    Oxygen at home:    Productive cough:     Wheezing:         Neurologic    Sudden weakness in arms or legs:     Sudden numbness in arms or legs:     Sudden onset of difficulty speaking or slurred speech:    Temporary loss of vision in one eye:     Problems with dizziness:         Gastrointestinal    Blood in stool:     Vomited blood:         Genitourinary    Burning when urinating:     Blood in urine:          Psychiatric    Major depression:         Hematologic    Bleeding problems:    Problems with blood clotting too easily:        Skin    Rashes or ulcers:        Constitutional    Fever or chills:     PHYSICAL EXAM:   Vitals:   02/20/19 1435  BP: 123/67  Pulse: 61  Resp: 20  Temp: 97.7 F (36.5 C)  SpO2: 100%  Weight: 179 lb (81.2 kg)  Height: 6' (1.829 m)    GENERAL: The patient is a well-nourished male, in no acute distress. The vital signs are documented above. CARDIAC: There is a regular rate and rhythm.  VASCULAR: He has palpable radial pulses bilaterally. PULMONARY: There is good air exchange bilaterally without wheezing or rales. ABDOMEN:  MUSCULOSKELETAL: There are no major deformities or cyanosis. NEUROLOGIC: No focal weakness or paresthesias are detected. SKIN: There are no ulcers or rashes noted. PSYCHIATRIC: The patient has a normal affect.  DATA:    UPPER EXTREMITY VEIN MAP: I have independently interpreted his upper extremity vein map.  On the right side, the forearm and upper arm cephalic vein is marginal in size.  The basilic vein is marginal in size.  On the left side the forearm cephalic vein is especially small at the wrist.  The upper arm cephalic vein is marginal in size.  The basilic vein is marginal in size.  ARTERIAL DUPLEX: I have independently interpreted his arterial duplex scan today.  On the right side there is a triphasic radial and ulnar waveform.  The brachial artery measures 0.52 cm in diameter.  On the left side there is a triphasic radial and ulnar waveform.  The brachial artery measures 0.62 cm in diameter.  LABS: GFR on 01/22/2019 was 14.  Creatinine 4.02.  MEDICAL ISSUES:   STAGE V CHRONIC KIDNEY DISEASE: His veins are marginal in size.  Given that he is right-handed I have recommended placement of a left AV fistula or AV graft. I have explained the indications for placement of an AV fistula or AV graft. I've explained that  if at all possible we will place an AV fistula.  I have reviewed the risks of placement of an AV fistula including but not limited to: failure of the fistula to mature, need for subsequent interventions, and thrombosis. In addition I have reviewed the potential complications of placement of an AV graft. These risks include, but are not limited to, graft thrombosis, graft infection, wound healing problems, bleeding, arm swelling, and steal syndrome. All the patient's questions were answered and they are agreeable to proceed with surgery.  His surgery is scheduled for 03/11/2018.  He will be due for his follow-up CT of the abdomen (status post EVAR) in the near future.   Deitra Mayo [facility] [phone]

## 2019-02-20 NOTE — H&P (View-Only) (Signed)
Patient name: Ricky Harris MRN: [id number] DOB: Jul 27, 1940 Sex: male  REASON FOR VISIT:   Follow-up after endovascular repair of aneurysm and to evaluate for hemodialysis access  HPI:   Ricky Harris is a pleasant 78 y.o. male who presented with an enlarging abdominal aortic aneurysm.  This was 5.5 cm in maximum diameter elective repair was recommended.  The situation was complicated by his chronic kidney disease and also history of thrombocytopenia.  On 01/21/2019 he underwent right femoral artery exposure and Perclose of the left common femoral artery in addition to endovascular aneurysm repair using 3 components.  Only 14 cc of contrast was used intraoperatively.  He is here today to be evaluated for hemodialysis access.  He is right-handed.  He denies any recent uremic symptoms.  Past Medical History:  Diagnosis Date  . AAA (abdominal aortic aneurysm) ([place])    5.3cm , magd by vascular Dr Scot Dock ,  . Aortic stenosis   . Arthritis    knees  . Ascites   . Cancer [facility])    prostate  . Chronic kidney disease    stage 4, mgd by Dr Justin Mend   . 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([place])   . Coronary artery disease    on CT scan  . Gout   . H/O hypercholesterolemia   . Heart murmur   . History of falling    recent- see ct chest results of 04/25/2018  . Hypertension   . Hypothyroidism   . Liver cirrhosis ([place])   . Prediabetes    pt denies   . Renal artery stenosis ([place])   . Thrombocytopenia ([place])   . Wears glasses     Family History  Problem Relation Age of Onset  . Diabetes Mellitus II Mother   . Stroke Mother     SOCIAL HISTORY: Social History   Tobacco Use  . Smoking status: Never Smoker  . Smokeless tobacco: Never Used  Substance Use Topics  . Alcohol use: Not Currently    Allergies  Allergen Reactions  . Cephalexin Hives  . Hydralazine Hives    Current Outpatient Medications  Medication Sig Dispense Refill  . amLODipine (NORVASC) 5 MG tablet Take 5  mg by mouth daily.    Marland Kitchen aspirin EC 81 MG EC tablet Take 1 tablet (81 mg total) by mouth daily.    Marland Kitchen atorvastatin (LIPITOR) 10 MG tablet Take 10 mg by mouth daily at 6 PM.    . carvedilol (COREG) 12.5 MG tablet Take 12.5 mg by mouth 2 (two) times daily.    . cloNIDine (CATAPRES) 0.2 MG tablet Take 0.2 mg by mouth 2 (two) times daily.    . colchicine 0.6 MG tablet Take 0.6 mg by mouth daily as needed (gout flare).     . febuxostat (ULORIC) 40 MG tablet Take 40 mg by mouth daily.    Marland Kitchen levothyroxine (SYNTHROID, LEVOTHROID) 25 MCG tablet Take 25 mcg by mouth daily before breakfast.     . Multiple Vitamins-Minerals (MULTIVITAMIN GUMMIES ADULT) CHEW Chew 2 capsules by mouth daily.    . sodium bicarbonate 650 MG tablet Take 650 mg by mouth 2 (two) times daily.    . traMADol (ULTRAM) 50 MG tablet Take 1-2 tablets (50-100 mg total) by mouth daily as needed for moderate pain. 15 tablet 0  . pantoprazole (PROTONIX) 40 MG tablet Take 1 tablet (40 mg total) by mouth daily. 90 tablet 3   No current facility-administered medications for this visit.  REVIEW OF SYSTEMS:  [X]  denotes positive finding, [ ]  denotes negative finding Cardiac  Comments:  Chest pain or chest pressure:    Shortness of breath upon exertion:    Short of breath when lying flat:    Irregular heart rhythm:        Vascular    Pain in calf, thigh, or hip brought on by ambulation:    Pain in feet at night that wakes you up from your sleep:     Blood clot in your veins:    Leg swelling:         Pulmonary    Oxygen at home:    Productive cough:     Wheezing:         Neurologic    Sudden weakness in arms or legs:     Sudden numbness in arms or legs:     Sudden onset of difficulty speaking or slurred speech:    Temporary loss of vision in one eye:     Problems with dizziness:         Gastrointestinal    Blood in stool:     Vomited blood:         Genitourinary    Burning when urinating:     Blood in urine:          Psychiatric    Major depression:         Hematologic    Bleeding problems:    Problems with blood clotting too easily:        Skin    Rashes or ulcers:        Constitutional    Fever or chills:     PHYSICAL EXAM:   Vitals:   02/20/19 1435  BP: 123/67  Pulse: 61  Resp: 20  Temp: 97.7 F (36.5 C)  SpO2: 100%  Weight: 179 lb (81.2 kg)  Height: 6' (1.829 m)    GENERAL: The patient is a well-nourished male, in no acute distress. The vital signs are documented above. CARDIAC: There is a regular rate and rhythm.  VASCULAR: He has palpable radial pulses bilaterally. PULMONARY: There is good air exchange bilaterally without wheezing or rales. ABDOMEN:  MUSCULOSKELETAL: There are no major deformities or cyanosis. NEUROLOGIC: No focal weakness or paresthesias are detected. SKIN: There are no ulcers or rashes noted. PSYCHIATRIC: The patient has a normal affect.  DATA:    UPPER EXTREMITY VEIN MAP: I have independently interpreted his upper extremity vein map.  On the right side, the forearm and upper arm cephalic vein is marginal in size.  The basilic vein is marginal in size.  On the left side the forearm cephalic vein is especially small at the wrist.  The upper arm cephalic vein is marginal in size.  The basilic vein is marginal in size.  ARTERIAL DUPLEX: I have independently interpreted his arterial duplex scan today.  On the right side there is a triphasic radial and ulnar waveform.  The brachial artery measures 0.52 cm in diameter.  On the left side there is a triphasic radial and ulnar waveform.  The brachial artery measures 0.62 cm in diameter.  LABS: GFR on 01/22/2019 was 14.  Creatinine 4.02.  MEDICAL ISSUES:   STAGE V CHRONIC KIDNEY DISEASE: His veins are marginal in size.  Given that he is right-handed I have recommended placement of a left AV fistula or AV graft. I have explained the indications for placement of an AV fistula or AV graft. I've explained that  if at all possible we will place an AV fistula.  I have reviewed the risks of placement of an AV fistula including but not limited to: failure of the fistula to mature, need for subsequent interventions, and thrombosis. In addition I have reviewed the potential complications of placement of an AV graft. These risks include, but are not limited to, graft thrombosis, graft infection, wound healing problems, bleeding, arm swelling, and steal syndrome. All the patient's questions were answered and they are agreeable to proceed with surgery.  His surgery is scheduled for 03/11/2018.  He will be due for his follow-up CT of the abdomen (status post EVAR) in the near future.   Deitra Mayo [facility] [phone]

## 2019-02-26 ENCOUNTER — Other Ambulatory Visit: Payer: Self-pay

## 2019-02-26 ENCOUNTER — Encounter (HOSPITAL_COMMUNITY)
Admission: RE | Admit: 2019-02-26 | Discharge: 2019-02-26 | Disposition: A | Discharge status: Home / Self Care | Payer: Medicare Other | Source: Ambulatory Visit | Attending: Nephrology | Admitting: Nephrology

## 2019-02-26 VITALS — BP 150/78 | HR 56 | Resp 18

## 2019-02-26 DIAGNOSIS — N183 Chronic kidney disease, stage 3 unspecified: Secondary | ICD-10-CM

## 2019-02-26 MED ORDER — DARBEPOETIN ALFA 150 MCG/0.3ML IJ SOSY
150.0000 ug | PREFILLED_SYRINGE | INTRAMUSCULAR | Status: DC
  Administered 2019-02-26: 150 ug via SUBCUTANEOUS

## 2019-02-26 MED ORDER — DARBEPOETIN ALFA 150 MCG/0.3ML IJ SOSY
PREFILLED_SYRINGE | INTRAMUSCULAR | Status: AC
  Filled 2019-02-26: qty 0.3

## 2019-02-27 ENCOUNTER — Other Ambulatory Visit: Payer: Self-pay

## 2019-02-27 LAB — POCT HEMOGLOBIN-HEMACUE: Hemoglobin: 9.8 g/dL — ABNORMAL LOW (ref 13.0–17.0)

## 2019-02-28 ENCOUNTER — Other Ambulatory Visit: Payer: Self-pay

## 2019-02-28 ENCOUNTER — Ambulatory Visit (HOSPITAL_COMMUNITY)
Admission: RE | Admit: 2019-02-28 | Discharge: 2019-02-28 | Disposition: A | Discharge status: Home / Self Care | Payer: Medicare Other | Source: Ambulatory Visit | Attending: Gastroenterology | Admitting: Gastroenterology

## 2019-02-28 DIAGNOSIS — K7460 Unspecified cirrhosis of liver: Secondary | ICD-10-CM

## 2019-02-28 DIAGNOSIS — R188 Other ascites: Secondary | ICD-10-CM | POA: Insufficient documentation

## 2019-02-28 HISTORY — PX: IR PARACENTESIS: IMG2679

## 2019-02-28 MED ORDER — ALBUMIN HUMAN 25 % IV SOLN
50.0000 g | Freq: Once | INTRAVENOUS | Status: AC
  Administered 2019-02-28: 50 g via INTRAVENOUS
  Filled 2019-02-28: qty 200

## 2019-02-28 MED ORDER — LIDOCAINE HCL 1 % IJ SOLN
INTRAMUSCULAR | Status: AC
  Filled 2019-02-28: qty 20

## 2019-02-28 MED ORDER — ALBUMIN HUMAN 25 % IV SOLN
INTRAVENOUS | Status: AC
  Filled 2019-02-28: qty 200

## 2019-02-28 MED ORDER — LIDOCAINE HCL 1 % IJ SOLN
INTRAMUSCULAR | Status: DC | PRN
  Administered 2019-02-28: 10 mL

## 2019-02-28 NOTE — Procedures (Signed)
PROCEDURE SUMMARY:  Successful US guided paracentesis from left lateral abdomen.  Yielded 4.3 liters of yellow fluid.  No immediate complications.  Pt tolerated well.   Specimen was not sent for labs. Received albumin.  EBL < 24mL  Docia Barrier PA-C 02/28/2019 9:57 AM

## 2019-03-08 ENCOUNTER — Inpatient Hospital Stay (HOSPITAL_COMMUNITY): Admission: RE | Admit: 2019-03-08 | Payer: Medicare Other | Source: Ambulatory Visit

## 2019-03-11 ENCOUNTER — Encounter (HOSPITAL_COMMUNITY): Payer: Self-pay | Admitting: Vascular Surgery

## 2019-03-11 ENCOUNTER — Other Ambulatory Visit (HOSPITAL_COMMUNITY)
Admission: RE | Admit: 2019-03-11 | Discharge: 2019-03-11 | Disposition: A | Discharge status: Home / Self Care | Payer: Medicare Other | Source: Ambulatory Visit | Attending: Vascular Surgery | Admitting: Vascular Surgery

## 2019-03-11 ENCOUNTER — Other Ambulatory Visit: Payer: Self-pay

## 2019-03-11 DIAGNOSIS — Z01812 Encounter for preprocedural laboratory examination: Secondary | ICD-10-CM | POA: Diagnosis present

## 2019-03-11 DIAGNOSIS — Z20822 Contact with and (suspected) exposure to covid-19: Secondary | ICD-10-CM | POA: Insufficient documentation

## 2019-03-11 LAB — SARS CORONAVIRUS 2 (TAT 6-24 HRS): SARS Coronavirus 2: NEGATIVE

## 2019-03-11 NOTE — Progress Notes (Signed)
Anesthesia Chart Review:  Case: [id number] Date/Time: 03/12/19 0715   Procedure: LEFT ARTERIOVENOUS (AV) FISTULA CREATION VERSES ARTERIOVENOUS GRAFT (Left )   Anesthesia type: Monitor Anesthesia Care   Pre-op diagnosis: CHRONIC KIDNEY DISEASE STAGE 5   Location: [place] OR ROOM 10 / [place] OR   Surgeons: Angelia Mould, MD      DISCUSSION: DISCUSSION:Patient is a 79 year old male who is scheduled for the above procedure.   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   He had recent echo as part of preoperative cardiology clearance for 12/2018 EVAR.   Nephrologist is Dr. Justin Mend. Currently, last office visit note available is from 12/18/18 (scanned under Media tab, Correspondence 01/23/19). Labs there on 12/03/18 showed BUN 50, Cr 3.28, eGRF 17, K 5.3. Plans for AVF would be considered following recovery from EVAR AAA.   He denied chest pain and SOB at PAT RN phone interview. 03/11/19 COVID-19 test is in process. He is a same day work-up, so he will get labs and anesthesia team evaluation on the day of surgery.    VS: For day of surgery. On 02/26/19, BP 150/78, HR 56.    PROVIDERS: [name]., MDis PCP ([place]). Last evaluation 02/20/19.  Gwenith Spitz, MD is cardiologist. He was seen on 12/17/18 by Talbert Cage, NP. (See [place]) - Edrick Oh, MD is nephrologist - Otis Brace, MD is GI [place]). Seen at [facility] 09/20/17, but  not felt to be a good candidate for liver transplantation at that time given CKD.   LABS: For day of surgery. As of 02/26/19, HGB 9.8. As of 01/22/19 labs in [facility] show Cr 4.02 and PLT count 73K (range 73-84K since 01/18/19). In [place], labs on 02/20/19 (per PCP) showed TSH 15.698 (H), Free T4 0.7, K 5.4 (H), BUN 67 (H), Cr 3.67 (H), alk phos 173 (H), AST 23, ALT 17, iron 45, transferrin (L) 156 (L), H/H 9.7/30.4 (L), PLT 114 (L).    EKG:12/14/18: Sinus bradycardia at 53 bpm Low voltage QRS Septal infarct , age undetermined Abnormal ECG Since last tracing rate slower Confirmed by Quay Burow [phone]) on 12/14/2018 12:49:23 PM   CV: Echo 01/14/19 [facility]):  Summary  There is no prior echocardiogram for comparison.  The aortic valve appears to be trileaflet.  There is moderate aortic sclerosis noted, with no evidence of stenosis.  No aortic regurgitation.  Ejection fraction is visually estimated at 60-65%.  Normal left ventricular size and systolic function with no appreciable  segmental abnormality.  Diastolic function is indeterminate.  Sigmoid deformity of the ventricular septum  The aortic root diameter is within normal limits.  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  The IVC is normal.  Ascites noted.  (Comparison Echo 03/30/17, Media tab; Correspondence 10/29/18: LVEF 60%, 4.0 cm ascending aorta)   Nuclear stress test 03/06/17(Dr. Cheek's office; scanned under Media tab, Correspondence, 07/05/17):  - Uneventful lexiscan injection. Normal SPECT perfusion imaging. No significant ischemia detected. No evidence of infarct. Clinically negative stress test with no chest pain. Normal LV systolic function. Normal BP response to stress. Calculated EF 58%.   Past  Medical History:  Diagnosis Date  . AAA (abdominal aortic aneurysm) ([place])    5.3cm , magd by vascular Dr Scot Dock ,  . Anemia    " slightly"  . Aortic stenosis   .  Arthritis    knees  . Ascites   . Cancer [facility])    prostate  . Chronic kidney disease    stage 4, mgd by Dr Justin Mend   . 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([place])   . Coronary artery disease    on CT scan  . Gout   . H/O hypercholesterolemia   . Heart murmur   . History of falling    recent- see ct chest results of 04/25/2018  . Hypertension   . Hypothyroidism   . Liver cirrhosis ([place])   . Prediabetes    pt denies   . Renal artery stenosis ([place])   . Thrombocytopenia ([place])   . Wears glasses   . Wears glasses     Past Surgical History:  Procedure Laterality Date  . ABDOMINAL AORTIC ENDOVASCULAR STENT GRAFT N/A 01/21/2019   Procedure: ABDOMINAL AORTIC ENDOVASCULAR STENT GRAFT WITH CO2;  Surgeon: Angelia Mould, MD;  Location: [place];  Service: Vascular;  Laterality: N/A;  . ABDOMINAL AORTOGRAM W/LOWER EXTREMITY Bilateral 12/14/2018   Procedure: ABDOMINAL AORTOGRAM W/LOWER EXTREMITY;  Surgeon: Angelia Mould, MD;  Location: [place] [facility];  Service: Cardiovascular;  Laterality: Bilateral;  . CATARACT EXTRACTION W/ INTRAOCULAR LENS  IMPLANT, BILATERAL    . CHOLECYSTECTOMY    . ESOPHAGEAL BANDING N/A 08/11/2017   Procedure: ESOPHAGEAL BANDING;  Surgeon: Otis Brace, MD;  Location: [place];  Service: Gastroenterology;  Laterality: N/A;  . ESOPHAGEAL BANDING N/A 10/26/2017   Procedure: ESOPHAGEAL BANDING;  Surgeon: Otis Brace, MD;  Location: [facility];  Service: Gastroenterology;  Laterality: N/A;  . ESOPHAGEAL BANDING N/A 12/19/2017   Procedure: ESOPHAGEAL BANDING;  Surgeon: Otis Brace, MD;  Location: [facility];  Service: Gastroenterology;  Laterality: N/A;  . ESOPHAGOGASTRODUODENOSCOPY (EGD) WITH PROPOFOL N/A 07/05/2017   Procedure: ESOPHAGOGASTRODUODENOSCOPY (EGD) WITH PROPOFOL;  Surgeon: Otis Brace, MD;  Location: [facility];  Service: Gastroenterology;  Laterality: N/A;  . ESOPHAGOGASTRODUODENOSCOPY (EGD) WITH PROPOFOL N/A  08/11/2017   Procedure: ESOPHAGOGASTRODUODENOSCOPY (EGD) WITH PROPOFOL;  Surgeon: Otis Brace, MD;  Location: [facility];  Service: Gastroenterology;  Laterality: N/A;  . ESOPHAGOGASTRODUODENOSCOPY (EGD) WITH PROPOFOL N/A 10/26/2017   Procedure: ESOPHAGOGASTRODUODENOSCOPY (EGD) WITH PROPOFOL;  Surgeon: Otis Brace, MD;  Location: [facility];  Service: Gastroenterology;  Laterality: N/A;  . ESOPHAGOGASTRODUODENOSCOPY (EGD) WITH PROPOFOL N/A 12/19/2017   Procedure: ESOPHAGOGASTRODUODENOSCOPY (EGD) WITH PROPOFOL;  Surgeon: Otis Brace, MD;  [facility]: [facility];  Service: Gastroenterology;  Laterality: N/A;  . ESOPHAGOGASTRODUODENOSCOPY (EGD) WITH PROPOFOL N/A 10/29/2018   Procedure: ESOPHAGOGASTRODUODENOSCOPY (EGD) WITH PROPOFOL;  Surgeon: Otis Brace, MD;  Location: [facility];  Service: Gastroenterology;  Laterality: N/A;  . EYE SURGERY     bilateral cataract removal with lens placement  . HERNIA REPAIR    . IR PARACENTESIS  12/26/2018  . IR PARACENTESIS  01/02/2019  . IR PARACENTESIS  01/15/2019  . IR PARACENTESIS  01/30/2019  . IR PARACENTESIS  02/05/2019  . IR PARACENTESIS  02/14/2019  . IR PARACENTESIS  02/28/2019  . PROSTATECTOMY    . subtotal gastrectomy      MEDICATIONS: No current facility-administered medications for this encounter.   Marland Kitchen amLODipine (NORVASC) 5 MG tablet  . aspirin EC 81 MG EC tablet  . atorvastatin (LIPITOR) 10 MG tablet  .  carvedilol (COREG) 12.5 MG tablet  . cloNIDine (CATAPRES) 0.2 MG tablet  . colchicine 0.6 MG tablet  . febuxostat (ULORIC) 40 MG tablet  . levothyroxine (SYNTHROID, LEVOTHROID) 25 MCG tablet  . Multiple Vitamins-Minerals (MULTIVITAMIN GUMMIES ADULT) CHEW  . pantoprazole (PROTONIX) 40 MG tablet  . sodium bicarbonate 650 MG tablet  . traMADol (ULTRAM) 50 MG tablet    Myra Gianotti, PA-C Surgical Short Stay/Anesthesiology [facility] Phone [phone] [facility] Phone [phone] 03/11/2019 11:45 AM

## 2019-03-11 NOTE — Anesthesia Preprocedure Evaluation (Addendum)
Anesthesia Evaluation  Patient identified by MRN, date of birth, ID band Patient awake    Reviewed: Allergy & Precautions, H&P , NPO status , Patient's Chart, lab work & pertinent test results  Airway Mallampati: II   Neck ROM: full    Dental   Pulmonary neg pulmonary ROS,    breath sounds clear to auscultation       Cardiovascular hypertension, + Peripheral Vascular Disease   Rhythm:regular Rate:Normal  AAA s/p stent graft   Neuro/Psych    GI/Hepatic (+) Cirrhosis   ascites    ,   Endo/Other  Hypothyroidism   Renal/GU ESRFRenal disease     Musculoskeletal  (+) Arthritis ,   Abdominal   Peds  Hematology   Anesthesia Other Findings   Reproductive/Obstetrics                            Anesthesia Physical Anesthesia Plan  ASA: IV  Anesthesia Plan: MAC   Post-op Pain Management:    Induction: Intravenous  PONV Risk Score and Plan: 1 and Ondansetron, Propofol infusion and Treatment may vary due to age or medical condition  Airway Management Planned: Simple Face Mask  Additional Equipment:   Intra-op Plan:   Post-operative Plan:   Informed Consent: I have reviewed the patients History and Physical, chart, labs and discussed the procedure including the risks, benefits and alternatives for the proposed anesthesia with the patient or authorized representative who has indicated his/her understanding and acceptance.       Plan Discussed with: CRNA, Anesthesiologist and Surgeon  Anesthesia Plan Comments: (PAT note written 03/11/2019 by Myra Gianotti, PA-C. )       Anesthesia Quick Evaluation

## 2019-03-11 NOTE — Progress Notes (Signed)
Pt denies SOB and chest pain. Pt stated that he is under the care of Dr. Gwenith Spitz, Cardiology and Dr. Gilford Rile, PCP. Pt denies having a cardiac cath. Pt denies having a chest x ray in the last year. Pt made aware to stop taking  vitamins, fish oil and herbal medications. Do not take any NSAIDs ie: Ibuprofen, Advil, Naproxen (Aleve), Motrin, BC and Goody Powder. Pt verbalized understanding of all pre-op instructions. PA, Anesthesiology , asked to review pt history.

## 2019-03-12 ENCOUNTER — Ambulatory Visit (HOSPITAL_COMMUNITY): Payer: Medicare Other | Admitting: Vascular Surgery

## 2019-03-12 ENCOUNTER — Encounter (HOSPITAL_COMMUNITY): Payer: Medicare Other

## 2019-03-12 ENCOUNTER — Encounter (HOSPITAL_COMMUNITY)
Admission: RE | Disposition: A | Discharge status: Home / Self Care | Payer: Self-pay | Source: Home / Self Care | Attending: Vascular Surgery

## 2019-03-12 ENCOUNTER — Ambulatory Visit (HOSPITAL_COMMUNITY)
Admission: RE | Admit: 2019-03-12 | Discharge: 2019-03-12 | Disposition: A | Discharge status: Home / Self Care | Payer: Medicare Other | Attending: Vascular Surgery | Admitting: Vascular Surgery

## 2019-03-12 ENCOUNTER — Other Ambulatory Visit: Payer: Self-pay

## 2019-03-12 ENCOUNTER — Encounter (HOSPITAL_COMMUNITY): Payer: Self-pay | Admitting: Vascular Surgery

## 2019-03-12 DIAGNOSIS — E039 Hypothyroidism, unspecified: Secondary | ICD-10-CM | POA: Diagnosis not present

## 2019-03-12 DIAGNOSIS — M109 Gout, unspecified: Secondary | ICD-10-CM | POA: Insufficient documentation

## 2019-03-12 DIAGNOSIS — I714 Abdominal aortic aneurysm, without rupture, unspecified: Secondary | ICD-10-CM

## 2019-03-12 DIAGNOSIS — Z79899 Other long term (current) drug therapy: Secondary | ICD-10-CM | POA: Diagnosis not present

## 2019-03-12 DIAGNOSIS — I2510 Atherosclerotic heart disease of native coronary artery without angina pectoris: Secondary | ICD-10-CM | POA: Insufficient documentation

## 2019-03-12 DIAGNOSIS — K7460 Unspecified cirrhosis of liver: Secondary | ICD-10-CM | POA: Diagnosis not present

## 2019-03-12 DIAGNOSIS — N185 Chronic kidney disease, stage 5: Secondary | ICD-10-CM | POA: Insufficient documentation

## 2019-03-12 DIAGNOSIS — E7800 Pure hypercholesterolemia, unspecified: Secondary | ICD-10-CM | POA: Insufficient documentation

## 2019-03-12 DIAGNOSIS — Z7982 Long term (current) use of aspirin: Secondary | ICD-10-CM | POA: Diagnosis not present

## 2019-03-12 DIAGNOSIS — Z79890 Hormone replacement therapy (postmenopausal): Secondary | ICD-10-CM | POA: Diagnosis not present

## 2019-03-12 DIAGNOSIS — N184 Chronic kidney disease, stage 4 (severe): Secondary | ICD-10-CM | POA: Diagnosis not present

## 2019-03-12 DIAGNOSIS — I350 Nonrheumatic aortic (valve) stenosis: Secondary | ICD-10-CM | POA: Insufficient documentation

## 2019-03-12 DIAGNOSIS — N183 Chronic kidney disease, stage 3 unspecified: Secondary | ICD-10-CM

## 2019-03-12 DIAGNOSIS — I120 Hypertensive chronic kidney disease with stage 5 chronic kidney disease or end stage renal disease: Secondary | ICD-10-CM | POA: Insufficient documentation

## 2019-03-12 DIAGNOSIS — M170 Bilateral primary osteoarthritis of knee: Secondary | ICD-10-CM | POA: Diagnosis not present

## 2019-03-12 DIAGNOSIS — Z881 Allergy status to other antibiotic agents status: Secondary | ICD-10-CM | POA: Diagnosis not present

## 2019-03-12 HISTORY — PX: AV FISTULA PLACEMENT: SHX1204

## 2019-03-12 HISTORY — DX: Anemia, unspecified: D64.9

## 2019-03-12 LAB — POCT I-STAT, CHEM 8
BUN: 59 mg/dL — ABNORMAL HIGH (ref 8–23)
Calcium, Ion: 1.07 mmol/L — ABNORMAL LOW (ref 1.15–1.40)
Chloride: 118 mmol/L — ABNORMAL HIGH (ref 98–111)
Creatinine, Ser: 4.2 mg/dL — ABNORMAL HIGH (ref 0.61–1.24)
Glucose, Bld: 81 mg/dL (ref 70–99)
HCT: 28 % — ABNORMAL LOW (ref 39.0–52.0)
Hemoglobin: 9.5 g/dL — ABNORMAL LOW (ref 13.0–17.0)
Potassium: 5.6 mmol/L — ABNORMAL HIGH (ref 3.5–5.1)
Sodium: 140 mmol/L (ref 135–145)
TCO2: 15 mmol/L — ABNORMAL LOW (ref 22–32)

## 2019-03-12 SURGERY — ARTERIOVENOUS (AV) FISTULA CREATION
Anesthesia: Monitor Anesthesia Care | Site: Arm Lower | Laterality: Left

## 2019-03-12 MED ORDER — CHLORHEXIDINE GLUCONATE 4 % EX LIQD: 60.0000 mL | Freq: Once | CUTANEOUS | Status: DC

## 2019-03-12 MED ORDER — PROPOFOL 500 MG/50ML IV EMUL
INTRAVENOUS | Status: DC | PRN
  Administered 2019-03-12: 75 ug/kg/min via INTRAVENOUS

## 2019-03-12 MED ORDER — LIDOCAINE-EPINEPHRINE 1 %-1:100000 IJ SOLN
INTRAMUSCULAR | Status: DC | PRN
  Administered 2019-03-12: 10 mL via INTRADERMAL

## 2019-03-12 MED ORDER — PAPAVERINE HCL 30 MG/ML IJ SOLN
INTRAMUSCULAR | Status: DC | PRN
  Administered 2019-03-12: 60 mg

## 2019-03-12 MED ORDER — VANCOMYCIN HCL IN DEXTROSE 1-5 GM/200ML-% IV SOLN
INTRAVENOUS | Status: AC
  Filled 2019-03-12: qty 200

## 2019-03-12 MED ORDER — SODIUM CHLORIDE 0.9 % IV SOLN: INTRAVENOUS | Status: DC

## 2019-03-12 MED ORDER — VANCOMYCIN HCL IN DEXTROSE 1-5 GM/200ML-% IV SOLN
1000.0000 mg | INTRAVENOUS | Status: AC
  Administered 2019-03-12: 1000 mg via INTRAVENOUS

## 2019-03-12 MED ORDER — MIDAZOLAM HCL 2 MG/2ML IJ SOLN
INTRAMUSCULAR | Status: AC
  Filled 2019-03-12: qty 2

## 2019-03-12 MED ORDER — LIDOCAINE-EPINEPHRINE 1 %-1:100000 IJ SOLN
INTRAMUSCULAR | Status: AC
  Filled 2019-03-12: qty 1

## 2019-03-12 MED ORDER — PROPOFOL 10 MG/ML IV BOLUS
INTRAVENOUS | Status: AC
  Filled 2019-03-12: qty 40

## 2019-03-12 MED ORDER — TRAMADOL HCL 50 MG PO TABS: 50.0000 mg | ORAL_TABLET | Freq: Three times a day (TID) | ORAL | 0 refills | Status: DC | PRN

## 2019-03-12 MED ORDER — PROTAMINE SULFATE 10 MG/ML IV SOLN
INTRAVENOUS | Status: DC | PRN
  Administered 2019-03-12: 40 mg via INTRAVENOUS

## 2019-03-12 MED ORDER — ONDANSETRON HCL 4 MG/2ML IJ SOLN
INTRAMUSCULAR | Status: DC | PRN
  Administered 2019-03-12: 4 mg via INTRAVENOUS

## 2019-03-12 MED ORDER — FENTANYL CITRATE (PF) 250 MCG/5ML IJ SOLN
INTRAMUSCULAR | Status: AC
  Filled 2019-03-12: qty 5

## 2019-03-12 MED ORDER — SODIUM CHLORIDE 0.9 % IV SOLN: INTRAVENOUS | Status: DC | PRN

## 2019-03-12 MED ORDER — HEPARIN SODIUM (PORCINE) 1000 UNIT/ML IJ SOLN
INTRAMUSCULAR | Status: DC | PRN
  Administered 2019-03-12: 7000 [IU] via INTRAVENOUS

## 2019-03-12 MED ORDER — PAPAVERINE HCL 30 MG/ML IJ SOLN
INTRAMUSCULAR | Status: AC
  Filled 2019-03-12: qty 2

## 2019-03-12 MED ORDER — 0.9 % SODIUM CHLORIDE (POUR BTL) OPTIME
TOPICAL | Status: DC | PRN
  Administered 2019-03-12: 1000 mL

## 2019-03-12 MED ORDER — SODIUM CHLORIDE 0.9 % IV SOLN
INTRAVENOUS | Status: AC
  Filled 2019-03-12: qty 1.2

## 2019-03-12 MED ORDER — PHENYLEPHRINE HCL-NACL 10-0.9 MG/250ML-% IV SOLN
INTRAVENOUS | Status: DC | PRN
  Administered 2019-03-12: 30 ug/min via INTRAVENOUS

## 2019-03-12 SURGICAL SUPPLY — 39 items
ARMBAND PINK RESTRICT EXTREMIT (MISCELLANEOUS) ×2 IMPLANT
CANISTER SUCT 3000ML PPV (MISCELLANEOUS) ×2 IMPLANT
CANNULA VESSEL 3MM 2 BLNT TIP (CANNULA) ×4 IMPLANT
CLIP VESOCCLUDE MED 6/CT (CLIP) ×2 IMPLANT
CLIP VESOCCLUDE SM WIDE 6/CT (CLIP) ×4 IMPLANT
COVER PROBE W GEL 5X96 (DRAPES) IMPLANT
COVER WAND RF STERILE (DRAPES) ×2 IMPLANT
DECANTER SPIKE VIAL GLASS SM (MISCELLANEOUS) ×2 IMPLANT
DERMABOND ADVANCED (GAUZE/BANDAGES/DRESSINGS) ×1
DERMABOND ADVANCED .7 DNX12 (GAUZE/BANDAGES/DRESSINGS) ×1 IMPLANT
ELECT REM PT RETURN 9FT ADLT (ELECTROSURGICAL) ×2
ELECTRODE REM PT RTRN 9FT ADLT (ELECTROSURGICAL) ×1 IMPLANT
GLOVE BIO SURGEON STRL SZ 6.5 (GLOVE) ×2 IMPLANT
GLOVE BIO SURGEON STRL SZ7.5 (GLOVE) ×4 IMPLANT
GLOVE BIOGEL PI IND STRL 6.5 (GLOVE) ×1 IMPLANT
GLOVE BIOGEL PI IND STRL 7.0 (GLOVE) ×1 IMPLANT
GLOVE BIOGEL PI IND STRL 7.5 (GLOVE) ×2 IMPLANT
GLOVE BIOGEL PI IND STRL 8 (GLOVE) ×1 IMPLANT
GLOVE BIOGEL PI INDICATOR 6.5 (GLOVE) ×1
GLOVE BIOGEL PI INDICATOR 7.0 (GLOVE) ×1
GLOVE BIOGEL PI INDICATOR 7.5 (GLOVE) ×2
GLOVE BIOGEL PI INDICATOR 8 (GLOVE) ×1
GOWN STRL REUS W/ TWL LRG LVL3 (GOWN DISPOSABLE) ×5 IMPLANT
GOWN STRL REUS W/TWL LRG LVL3 (GOWN DISPOSABLE) ×5
KIT BASIN OR (CUSTOM PROCEDURE TRAY) ×2 IMPLANT
KIT TURNOVER KIT B (KITS) ×2 IMPLANT
NS IRRIG 1000ML POUR BTL (IV SOLUTION) ×2 IMPLANT
PACK CV ACCESS (CUSTOM PROCEDURE TRAY) ×2 IMPLANT
PAD ARMBOARD 7.5X6 YLW CONV (MISCELLANEOUS) ×4 IMPLANT
SPONGE SURGIFOAM ABS GEL 100 (HEMOSTASIS) IMPLANT
SUT PROLENE 6 0 BV (SUTURE) ×2 IMPLANT
SUT VIC AB 3-0 SH 27 (SUTURE) ×2
SUT VIC AB 3-0 SH 27X BRD (SUTURE) ×2 IMPLANT
SUT VIC AB 4-0 PS2 18 (SUTURE) ×2 IMPLANT
SUT VICRYL 4-0 PS2 18IN ABS (SUTURE) ×2 IMPLANT
SYR 20ML LL LF (SYRINGE) ×2 IMPLANT
TOWEL GREEN STERILE (TOWEL DISPOSABLE) ×2 IMPLANT
UNDERPAD 30X30 (UNDERPADS AND DIAPERS) ×2 IMPLANT
WATER STERILE IRR 1000ML POUR (IV SOLUTION) ×2 IMPLANT

## 2019-03-12 NOTE — Op Note (Signed)
    NAME: Ricky Harris    MRN: [id number] DOB: 04-11-1940    DATE OF OPERATION: 03/12/2019  PREOP DIAGNOSIS:    Stage IV chronic kidney disease  POSTOP DIAGNOSIS:    Same  PROCEDURE:    Left brachiocephalic AV fistula  SURGEON: Judeth Cornfield. Scot Dock, MD  ASSIST: Leontine Locket, PA  ANESTHESIA: Local with sedation  EBL: Minimal  INDICATIONS:    Ricky Harris is a 79 y.o. male who recently underwent endovascular aneurysm repair.  He has chronic kidney disease and is not yet on dialysis.  He presents for placement of a fistula.  FINDINGS:   The cephalic vein was 3.5 mm.  Brachial artery was soft with no significant disease.  TECHNIQUE:   The patient was taken to the operating room and I looked at the cephalic vein and basilic vein with the SonoSite.  I thought he was a candidate for brachiocephalic fistula.  The vein was fairly far lateral however so had to make separate incisions over the vein and the artery.  An incision was made over the cephalic vein in the lateral upper arm and then just below the antecubital level in order to obtain adequate length.  Branches were divided between clips and 3-0 silk ties.  A separate incision was made over the brachial artery after the skin was anesthetized.  The brachial artery was dissected free.  The tunnel was created between the 2 incisions the patient was heparinized.  The vein was ligated distally and divided, and then the brachial artery was clamped proximally and distally and opened longitudinally.  The vein was sewn into side to the artery using continuous 6-0 Prolene suture.  At the completion there was an excellent thrill in the fistula and a palpable radial pulse.  The heparin was partially reversed with protamine.  The wounds were each closed with a deep layer of 3-0 Vicryl and the skin closed with 4-0 Vicryl.  Dermabond was applied.  The patient tolerated the procedure well was transferred to the recovery room in stable  condition.  All needle and sponge counts were correct.  Deitra Mayo, MD, FACS [facility]  DATE OF DICTATION:   03/12/2019

## 2019-03-12 NOTE — Discharge Instructions (Signed)
   [facility]  Discharge Instructions  AV Fistula or Graft Surgery for Dialysis Access  Please refer to the following instructions for your post-procedure care. Your surgeon or physician assistant will discuss any changes with you.  Activity  You may drive the day following your surgery, if you are comfortable and no longer taking prescription pain medication. Resume full activity as the soreness in your incision resolves.  Bathing/Showering  You may shower after you go home. Keep your incision dry for 48 hours. Do not soak in a bathtub, hot tub, or swim until the incision heals completely. You may not shower if you have a hemodialysis catheter.  Incision Care  Clean your incision with mild soap and water after 48 hours. Pat the area dry with a clean towel. You do not need a bandage unless otherwise instructed. Do not apply any ointments or creams to your incision. You may have skin glue on your incision. Do not peel it off. It will come off on its own in about one week. Your arm may swell a bit after surgery. To reduce swelling use pillows to elevate your arm so it is above your heart. Your doctor will tell you if you need to lightly wrap your arm with an ACE bandage.  Diet  Resume your normal diet. There are not special food restrictions following this procedure. In order to heal from your surgery, it is CRITICAL to get adequate nutrition. Your body requires vitamins, minerals, and protein. Vegetables are the best source of vitamins and minerals. Vegetables also provide the perfect balance of protein. Processed food has little nutritional value, so try to avoid this.  Medications  Resume taking all of your medications. If your incision is causing pain, you may take over-the counter pain relievers such as acetaminophen (Tylenol). If you were prescribed a stronger pain medication, please be aware these medications can cause nausea and constipation. Prevent  nausea by taking the medication with a snack or meal. Avoid constipation by drinking plenty of fluids and eating foods with high amount of fiber, such as fruits, vegetables, and grains.  Do not take Tylenol if you are taking prescription pain medications.  Follow up Your surgeon may want to see you in the office following your access surgery. If so, this will be arranged at the time of your surgery.  Please call us immediately for any of the following conditions:  . Increased pain, redness, drainage (pus) from your incision site . Fever of 101 degrees or higher . Severe or worsening pain at your incision site . Hand pain or numbness. .  Reduce your risk of vascular disease:  . Stop smoking. If you would like help, call [facility] at 1-800-QUIT-NOW (743) 877-8691) or [place] at 845-367-0796  . Manage your cholesterol . Maintain a desired weight . Control your diabetes . Keep your blood pressure down  Dialysis  It will take several weeks to several months for your new dialysis access to be ready for use. Your surgeon will determine when it is okay to use it. Your nephrologist will continue to direct your dialysis. You can continue to use your Permcath until your new access is ready for use.   03/12/2019 Ricky Harris [id number] 10-10-1940  Surgeon(s): Angelia Mould, MD  Procedure(s): Left brachiocephalic AV fistula creation  x Do not stick fistula for 12 weeks    If you have any questions, please call the office at [phone].

## 2019-03-12 NOTE — Interval H&P Note (Signed)
History and Physical Interval Note:  03/12/2019 7:17 AM  Ricky Harris  has presented today for surgery, with the diagnosis of CHRONIC KIDNEY DISEASE STAGE 5.  The various methods of treatment have been discussed with the patient and family. After consideration of risks, benefits and other options for treatment, the patient has consented to  Procedure(s): LEFT ARTERIOVENOUS (AV) FISTULA CREATION VERSES ARTERIOVENOUS GRAFT (Left) as a surgical intervention.  The patient's history has been reviewed, patient examined, no change in status, stable for surgery.  I have reviewed the patient's chart and labs.  Questions were answered to the patient's satisfaction.     Deitra Mayo

## 2019-03-12 NOTE — Transfer of Care (Signed)
Immediate Anesthesia Transfer of Care Note  Patient: Ricky Harris  Procedure(s) Performed: LEFT ARTERIOVENOUS Arteriovenous FISTULA CREATION. (Left Arm Lower)  Patient Location: PACU  Anesthesia Type:MAC  Level of Consciousness: awake, alert  and oriented  Airway & Oxygen Therapy: Patient Spontanous Breathing  Post-op Assessment: Report given to RN and Post -op Vital signs reviewed and stable  Post vital signs: Reviewed and stable  Last Vitals:  Vitals Value Taken Time  BP 96/53 03/12/19 0847  Temp    Pulse 51 03/12/19 0848  Resp 13 03/12/19 0848  SpO2 100 % 03/12/19 0848  Vitals shown include unvalidated device data.  Last Pain:  Vitals:   03/12/19 0651  TempSrc:   PainSc: 0-No pain      Patients Stated Pain Goal: 3 (27/87/18 3672)  Complications: No apparent anesthesia complications

## 2019-03-13 ENCOUNTER — Encounter: Payer: Self-pay | Admitting: *Deleted

## 2019-03-13 NOTE — Anesthesia Postprocedure Evaluation (Signed)
Anesthesia Post Note  Patient: Ricky Harris  Procedure(s) Performed: LEFT ARTERIOVENOUS Arteriovenous FISTULA CREATION. (Left Arm Lower)     Patient location during evaluation: PACU Anesthesia Type: MAC Level of consciousness: awake and alert Pain management: pain level controlled Vital Signs Assessment: post-procedure vital signs reviewed and stable Respiratory status: spontaneous breathing, nonlabored ventilation, respiratory function stable and patient connected to nasal cannula oxygen Cardiovascular status: stable and blood pressure returned to baseline Postop Assessment: no apparent nausea or vomiting Anesthetic complications: no    Last Vitals:  Vitals:   03/12/19 0930 03/12/19 0931  BP:    Pulse: (!) 51 (!) 51  Resp:    Temp:    SpO2: 100% 100%    Last Pain:  Vitals:   03/12/19 0929  TempSrc:   PainSc: 0-No pain                 Sundai Probert S

## 2019-03-14 ENCOUNTER — Ambulatory Visit (HOSPITAL_COMMUNITY)
Admission: RE | Admit: 2019-03-14 | Discharge: 2019-03-14 | Disposition: A | Discharge status: Home / Self Care | Payer: Medicare Other | Source: Ambulatory Visit | Attending: Gastroenterology | Admitting: Gastroenterology

## 2019-03-14 ENCOUNTER — Other Ambulatory Visit: Payer: Self-pay

## 2019-03-14 DIAGNOSIS — K7460 Unspecified cirrhosis of liver: Secondary | ICD-10-CM | POA: Diagnosis present

## 2019-03-14 DIAGNOSIS — R188 Other ascites: Secondary | ICD-10-CM | POA: Diagnosis present

## 2019-03-14 HISTORY — PX: IR PARACENTESIS: IMG2679

## 2019-03-14 MED ORDER — ALBUMIN HUMAN 25 % IV SOLN
50.0000 g | Freq: Once | INTRAVENOUS | Status: AC
  Administered 2019-03-14: 50 g via INTRAVENOUS
  Filled 2019-03-14: qty 200

## 2019-03-14 MED ORDER — LIDOCAINE HCL 1 % IJ SOLN
INTRAMUSCULAR | Status: AC
  Filled 2019-03-14: qty 20

## 2019-03-14 MED ORDER — ALBUMIN HUMAN 25 % IV SOLN
INTRAVENOUS | Status: AC
  Filled 2019-03-14: qty 200

## 2019-03-14 MED ORDER — LIDOCAINE HCL (PF) 1 % IJ SOLN
INTRAMUSCULAR | Status: DC | PRN
  Administered 2019-03-14: 10 mL

## 2019-03-19 ENCOUNTER — Encounter (HOSPITAL_COMMUNITY): Payer: Medicare Other

## 2019-03-22 ENCOUNTER — Ambulatory Visit
Admission: RE | Admit: 2019-03-22 | Discharge: 2019-03-22 | Disposition: A | Discharge status: Home / Self Care | Payer: Medicare Other | Source: Ambulatory Visit | Attending: Vascular Surgery | Admitting: Vascular Surgery

## 2019-03-22 DIAGNOSIS — I714 Abdominal aortic aneurysm, without rupture, unspecified: Secondary | ICD-10-CM

## 2019-03-26 ENCOUNTER — Ambulatory Visit (HOSPITAL_COMMUNITY)
Admission: RE | Admit: 2019-03-26 | Discharge: 2019-03-26 | Disposition: A | Discharge status: Home / Self Care | Payer: Medicare Other | Source: Ambulatory Visit | Attending: Nephrology | Admitting: Nephrology

## 2019-03-26 ENCOUNTER — Other Ambulatory Visit: Payer: Self-pay

## 2019-03-26 VITALS — BP 109/57 | HR 57 | Temp 96.9°F | Resp 20

## 2019-03-26 DIAGNOSIS — N183 Chronic kidney disease, stage 3 unspecified: Secondary | ICD-10-CM | POA: Diagnosis present

## 2019-03-26 DIAGNOSIS — D631 Anemia in chronic kidney disease: Secondary | ICD-10-CM | POA: Diagnosis not present

## 2019-03-26 LAB — POCT HEMOGLOBIN-HEMACUE: Hemoglobin: 8.3 g/dL — ABNORMAL LOW (ref 13.0–17.0)

## 2019-03-26 LAB — IRON AND TIBC
Iron: 50 ug/dL (ref 45–182)
Saturation Ratios: 27 % (ref 17.9–39.5)
TIBC: 188 ug/dL — ABNORMAL LOW (ref 250–450)
UIBC: 138 ug/dL

## 2019-03-26 LAB — FERRITIN: Ferritin: 91 ng/mL (ref 24–336)

## 2019-03-26 MED ORDER — DARBEPOETIN ALFA 100 MCG/0.5ML IJ SOSY: 150.0000 ug | PREFILLED_SYRINGE | INTRAMUSCULAR | Status: DC

## 2019-03-26 MED ORDER — DARBEPOETIN ALFA 150 MCG/0.3ML IJ SOSY
PREFILLED_SYRINGE | INTRAMUSCULAR | Status: AC
  Administered 2019-03-26: 150 ug
  Filled 2019-03-26: qty 0.3

## 2019-03-27 ENCOUNTER — Other Ambulatory Visit: Payer: Self-pay

## 2019-03-27 ENCOUNTER — Encounter: Payer: Self-pay | Admitting: Vascular Surgery

## 2019-03-27 ENCOUNTER — Ambulatory Visit (INDEPENDENT_AMBULATORY_CARE_PROVIDER_SITE_OTHER): Payer: Self-pay | Admitting: Vascular Surgery

## 2019-03-27 VITALS — BP 155/72 | HR 59 | Temp 98.0°F | Resp 20 | Ht 72.0 in | Wt 186.6 lb

## 2019-03-27 DIAGNOSIS — I714 Abdominal aortic aneurysm, without rupture, unspecified: Secondary | ICD-10-CM

## 2019-03-27 DIAGNOSIS — N186 End stage renal disease: Secondary | ICD-10-CM

## 2019-03-27 DIAGNOSIS — Z992 Dependence on renal dialysis: Secondary | ICD-10-CM

## 2019-03-27 DIAGNOSIS — Z48812 Encounter for surgical aftercare following surgery on the circulatory system: Secondary | ICD-10-CM

## 2019-03-27 NOTE — Progress Notes (Signed)
Patient name: Ricky Harris MRN: [id number] DOB: 08-May-1940 Sex: male  REASON FOR VISIT:   Follow-up after EVAR.  HPI:   Ricky Harris is a pleasant 79 y.o. male who had presented with a 5.5 cm infrarenal abdominal aortic aneurysm.  He had advanced chronic kidney disease.  He underwent endovascular repair on 01/21/2019 using 3 components.  The main body was placed up the right side and this was a 26 mm x 14 mm x 12 cm device.  This was subsequently extended with a 16 mm x 9.5 cm device.  Gait was angled anteriorly.  CO2 was used for the procedure.  On the left side the iliac limb was a 2mm by 13-1/2 cm device.  A total of 14 cc of contrast was used.  On 03/12/2019 the patient had a left brachiocephalic fistula placed.  Since I saw him last he has been doing well.  He has no specific complaints.  He is not on dialysis.  He denies claudication or rest pain.  He denies chest pain or shortness of breath.  Past Medical History:  Diagnosis Date  . AAA (abdominal aortic aneurysm) ([place])    5.3cm , magd by vascular Dr Scot Dock ,  . Anemia    " slightly"  . Aortic stenosis   . Arthritis    knees  . Ascites   . Cancer [facility])    prostate  . Chronic kidney disease    stage 4, mgd by Dr Justin Mend   . 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([place])   . Coronary artery disease    on CT scan  . Gout   . H/O hypercholesterolemia   . Heart murmur   . History of falling    recent- see ct chest results of 04/25/2018  . Hypertension   . Hypothyroidism   . Liver cirrhosis ([place])   . Prediabetes    pt denies   . Renal artery stenosis ([place])   . Thrombocytopenia ([place])   . Wears glasses   . Wears glasses     Family History  Problem Relation Age of Onset  . Diabetes Mellitus II Mother   . Stroke Mother     SOCIAL HISTORY: Social History   Tobacco Use  . Smoking status: Never Smoker  . Smokeless tobacco: Never Used  Substance Use Topics  . Alcohol use: Not Currently    Allergies  Allergen  Reactions  . Cephalexin Hives  . Hydralazine Hives    Current Outpatient Medications  Medication Sig Dispense Refill  . amLODipine (NORVASC) 5 MG tablet Take 5 mg by mouth daily.    Marland Kitchen aspirin EC 81 MG EC tablet Take 1 tablet (81 mg total) by mouth daily. (Patient taking differently: Take 81 mg by mouth at bedtime. )    . atorvastatin (LIPITOR) 10 MG tablet Take 10 mg by mouth at bedtime.     . carvedilol (COREG) 12.5 MG tablet Take 12.5 mg by mouth 2 (two) times daily.    . cloNIDine (CATAPRES) 0.2 MG tablet Take 0.2 mg by mouth 2 (two) times daily.    . colchicine 0.6 MG tablet Take 0.6 mg by mouth daily as needed (gout flare).     . febuxostat (ULORIC) 40 MG tablet Take 40 mg by mouth daily.    Marland Kitchen levothyroxine (SYNTHROID, LEVOTHROID) 25 MCG tablet Take 25 mcg by mouth daily before breakfast.     . Multiple Vitamins-Minerals (MULTIVITAMIN GUMMIES ADULT) CHEW Chew 2 capsules by mouth  daily.    . pantoprazole (PROTONIX) 40 MG tablet Take 1 tablet (40 mg total) by mouth daily. 90 tablet 3  . sodium bicarbonate 650 MG tablet Take 650 mg by mouth 2 (two) times daily.    . traMADol (ULTRAM) 50 MG tablet Take 1 tablet (50 mg total) by mouth every 8 (eight) hours as needed for moderate pain. 8 tablet 0   No current facility-administered medications for this visit.    REVIEW OF SYSTEMS:  [X]  denotes positive finding, [ ]  denotes negative finding Cardiac  Comments:  Chest pain or chest pressure:    Shortness of breath upon exertion:    Short of breath when lying flat:    Irregular heart rhythm:        Vascular    Pain in calf, thigh, or hip brought on by ambulation:    Pain in feet at night that wakes you up from your sleep:     Blood clot in your veins:    Leg swelling:         Pulmonary    Oxygen at home:    Productive cough:     Wheezing:         Neurologic    Sudden weakness in arms or legs:     Sudden numbness in arms or legs:     Sudden onset of difficulty speaking or  slurred speech:    Temporary loss of vision in one eye:     Problems with dizziness:         Gastrointestinal    Blood in stool:     Vomited blood:         Genitourinary    Burning when urinating:     Blood in urine:        Psychiatric    Major depression:         Hematologic    Bleeding problems:    Problems with blood clotting too easily:        Skin    Rashes or ulcers:        Constitutional    Fever or chills:     PHYSICAL EXAM:   Vitals:   03/27/19 1505  BP: (!) 155/72  Pulse: (!) 59  Resp: 20  Temp: 98 F (36.7 C)  SpO2: 99%  Weight: 186 lb 9.6 oz (84.6 kg)  Height: 6' (1.829 m)    GENERAL: The patient is a well-nourished male, in no acute distress. The vital signs are documented above. CARDIAC: There is a regular rate and rhythm.  VASCULAR: He has normal femoral pulses bilaterally and palpable pedal pulses bilaterally. His fistula in the left upper arm has an excellent thrill and appears to be maturing nicely. PULMONARY: There is good air exchange bilaterally without wheezing or rales. ABDOMEN: He has significant ascites. MUSCULOSKELETAL: There are no major deformities or cyanosis. NEUROLOGIC: No focal weakness or paresthesias are detected. SKIN: There are no ulcers or rashes noted. PSYCHIATRIC: The patient has a normal affect.  DATA:    CT ABDOMEN PELVIS: I have reviewed the images of his CT abdomen pelvis that was done without contrast on 03/22/2019.  The maximum diameter of the aneurysm was 5.5 cm.  We were unable to assess for endoleak given that this was done without contrast however he is not on dialysis and we did not want to risk putting him on dialysis.  There is some compression of the left limb of the graft where the 2 limbs of the  graft go through the aortic bifurcation.  The graft is open.  MEDICAL ISSUES:   STATUS POST ENDOVASCULAR ANEURYSM REPAIR: The patient is doing well status post endovascular aneurysm repair.  The aneurysm measures 5.5  cm in maximum diameter.  The graft appears to be in good position.  There is some compression of the left iliac limb where the grafts traverse the bifurcation.  However he has normal femoral pulses and palpable pedal pulses.  I think I would only consider addressing this with a VBX stent if he developed a diminished pulse.  I have ordered a follow-up duplex in 6 months and we will also obtain ABIs at that time.  He knows to call sooner if he has problems.  STAGE V CHRONIC KIDNEY DISEASE: His GFR November 2020 was 14.  His fistula appears to be maturing nicely.  I think this could be used for dialysis starting in April if it is needed.  Deitra Mayo [facility] [phone]

## 2019-03-28 ENCOUNTER — Other Ambulatory Visit: Payer: Self-pay | Admitting: Gastroenterology

## 2019-03-28 ENCOUNTER — Other Ambulatory Visit: Payer: Self-pay | Admitting: *Deleted

## 2019-03-28 DIAGNOSIS — I714 Abdominal aortic aneurysm, without rupture, unspecified: Secondary | ICD-10-CM

## 2019-03-28 DIAGNOSIS — Z48812 Encounter for surgical aftercare following surgery on the circulatory system: Secondary | ICD-10-CM

## 2019-03-28 DIAGNOSIS — R188 Other ascites: Secondary | ICD-10-CM

## 2019-03-28 DIAGNOSIS — K7460 Unspecified cirrhosis of liver: Secondary | ICD-10-CM

## 2019-03-29 ENCOUNTER — Other Ambulatory Visit: Payer: Self-pay

## 2019-03-29 ENCOUNTER — Ambulatory Visit (HOSPITAL_COMMUNITY)
Admission: RE | Admit: 2019-03-29 | Discharge: 2019-03-29 | Disposition: A | Discharge status: Home / Self Care | Payer: Medicare Other | Source: Ambulatory Visit | Attending: Gastroenterology | Admitting: Gastroenterology

## 2019-03-29 DIAGNOSIS — R188 Other ascites: Secondary | ICD-10-CM | POA: Insufficient documentation

## 2019-03-29 HISTORY — PX: IR PARACENTESIS: IMG2679

## 2019-03-29 MED ORDER — LIDOCAINE HCL 1 % IJ SOLN
INTRAMUSCULAR | Status: AC
  Filled 2019-03-29: qty 20

## 2019-03-29 MED ORDER — ALBUMIN HUMAN 25 % IV SOLN
INTRAVENOUS | Status: AC
  Filled 2019-03-29: qty 200

## 2019-03-29 MED ORDER — LIDOCAINE HCL (PF) 1 % IJ SOLN
INTRAMUSCULAR | Status: DC | PRN
  Administered 2019-03-29: 10 mL

## 2019-03-29 NOTE — Procedures (Signed)
PROCEDURE SUMMARY:  Successful image-guided paracentesis from the left lower abdomen.  Yielded 6.1 liters of clear yellow fluid.  No immediate complications.  EBL: zero Patient tolerated well.   Specimen was not sent for labs.  Patient to receive post procedure albumin per ordering provider.  Please see imaging section of Epic for full dictation.  Joaquim Nam PA-C 03/29/2019 10:14 AM

## 2019-04-01 ENCOUNTER — Other Ambulatory Visit: Payer: Medicare Other

## 2019-04-09 ENCOUNTER — Other Ambulatory Visit: Payer: Self-pay

## 2019-04-09 ENCOUNTER — Ambulatory Visit (HOSPITAL_COMMUNITY)
Admission: RE | Admit: 2019-04-09 | Discharge: 2019-04-09 | Disposition: A | Discharge status: Home / Self Care | Payer: Medicare Other | Source: Ambulatory Visit | Attending: Nephrology | Admitting: Nephrology

## 2019-04-09 VITALS — BP 131/60 | HR 55 | Temp 97.3°F | Resp 20

## 2019-04-09 DIAGNOSIS — N183 Chronic kidney disease, stage 3 unspecified: Secondary | ICD-10-CM | POA: Insufficient documentation

## 2019-04-09 LAB — POCT HEMOGLOBIN-HEMACUE: Hemoglobin: 8.5 g/dL — ABNORMAL LOW (ref 13.0–17.0)

## 2019-04-09 MED ORDER — DARBEPOETIN ALFA 100 MCG/0.5ML IJ SOSY
150.0000 ug | PREFILLED_SYRINGE | INTRAMUSCULAR | Status: DC
Start: 2019-04-09 — End: 2019-04-10
  Administered 2019-04-09: 150 ug via SUBCUTANEOUS

## 2019-04-09 MED ORDER — DARBEPOETIN ALFA 150 MCG/0.3ML IJ SOSY
PREFILLED_SYRINGE | INTRAMUSCULAR | Status: AC
  Filled 2019-04-09: qty 0.3

## 2019-04-10 MED FILL — Darbepoetin Alfa Soln Prefilled Syringe 150 MCG/0.3ML: INTRAMUSCULAR | Qty: 0.3 | Status: AC

## 2019-04-11 ENCOUNTER — Other Ambulatory Visit: Payer: Self-pay

## 2019-04-11 ENCOUNTER — Other Ambulatory Visit (HOSPITAL_COMMUNITY): Payer: Self-pay | Admitting: Gastroenterology

## 2019-04-11 ENCOUNTER — Ambulatory Visit (HOSPITAL_COMMUNITY)
Admission: RE | Admit: 2019-04-11 | Discharge: 2019-04-11 | Disposition: A | Discharge status: Home / Self Care | Payer: Medicare Other | Source: Ambulatory Visit | Attending: Gastroenterology | Admitting: Gastroenterology

## 2019-04-11 DIAGNOSIS — R188 Other ascites: Secondary | ICD-10-CM

## 2019-04-11 HISTORY — PX: IR PARACENTESIS: IMG2679

## 2019-04-11 MED ORDER — LIDOCAINE HCL 1 % IJ SOLN
INTRAMUSCULAR | Status: DC | PRN
  Administered 2019-04-11: 10 mL

## 2019-04-11 MED ORDER — LIDOCAINE HCL 1 % IJ SOLN
INTRAMUSCULAR | Status: AC
  Filled 2019-04-11: qty 20

## 2019-04-11 MED ORDER — ALBUMIN HUMAN 25 % IV SOLN
50.0000 g | Freq: Once | INTRAVENOUS | Status: AC
  Administered 2019-04-11: 50 g via INTRAVENOUS

## 2019-04-11 MED ORDER — ALBUMIN HUMAN 25 % IV SOLN
INTRAVENOUS | Status: AC
  Filled 2019-04-11: qty 200

## 2019-04-11 NOTE — Procedures (Signed)
PROCEDURE SUMMARY:  Successful image-guided paracentesis from the left lower abdomen.  Yielded 6.0 liters of clear yellow fluid.  No immediate complications.  EBL: zero Patient tolerated well.   Specimen was not sent for labs.  Patient to receive post procedure albumin.  Please see imaging section of Epic for full dictation.  Joaquim Nam PA-C 04/11/2019 9:09 AM

## 2019-04-15 DIAGNOSIS — L299 Pruritus, unspecified: Secondary | ICD-10-CM | POA: Insufficient documentation

## 2019-04-15 DIAGNOSIS — D692 Other nonthrombocytopenic purpura: Secondary | ICD-10-CM | POA: Insufficient documentation

## 2019-04-23 ENCOUNTER — Ambulatory Visit (HOSPITAL_COMMUNITY)
Admission: RE | Admit: 2019-04-23 | Discharge: 2019-04-23 | Disposition: A | Discharge status: Home / Self Care | Payer: Medicare Other | Source: Ambulatory Visit | Attending: Nephrology | Admitting: Nephrology

## 2019-04-23 ENCOUNTER — Other Ambulatory Visit: Payer: Self-pay

## 2019-04-23 VITALS — BP 134/60 | HR 59 | Temp 97.6°F | Resp 20

## 2019-04-23 DIAGNOSIS — D631 Anemia in chronic kidney disease: Secondary | ICD-10-CM | POA: Diagnosis not present

## 2019-04-23 DIAGNOSIS — N183 Chronic kidney disease, stage 3 unspecified: Secondary | ICD-10-CM

## 2019-04-23 LAB — IRON AND TIBC
Iron: 39 ug/dL — ABNORMAL LOW (ref 45–182)
Saturation Ratios: 18 % (ref 17.9–39.5)
TIBC: 223 ug/dL — ABNORMAL LOW (ref 250–450)
UIBC: 184 ug/dL

## 2019-04-23 LAB — POCT HEMOGLOBIN-HEMACUE: Hemoglobin: 9.4 g/dL — ABNORMAL LOW (ref 13.0–17.0)

## 2019-04-23 LAB — FERRITIN: Ferritin: 44 ng/mL (ref 24–336)

## 2019-04-23 MED ORDER — DARBEPOETIN ALFA 150 MCG/0.3ML IJ SOSY
PREFILLED_SYRINGE | INTRAMUSCULAR | Status: AC
  Filled 2019-04-23: qty 0.3

## 2019-04-23 MED ORDER — DARBEPOETIN ALFA 150 MCG/0.3ML IJ SOSY
150.0000 ug | PREFILLED_SYRINGE | INTRAMUSCULAR | Status: DC
  Administered 2019-04-23: 150 ug via SUBCUTANEOUS

## 2019-04-25 ENCOUNTER — Other Ambulatory Visit: Payer: Self-pay

## 2019-04-25 ENCOUNTER — Ambulatory Visit (HOSPITAL_COMMUNITY)
Admission: RE | Admit: 2019-04-25 | Discharge: 2019-04-25 | Disposition: A | Discharge status: Home / Self Care | Payer: Medicare Other | Source: Ambulatory Visit | Attending: Gastroenterology | Admitting: Gastroenterology

## 2019-04-25 DIAGNOSIS — R188 Other ascites: Secondary | ICD-10-CM | POA: Diagnosis present

## 2019-04-25 HISTORY — PX: IR PARACENTESIS: IMG2679

## 2019-04-25 MED ORDER — LIDOCAINE HCL (PF) 1 % IJ SOLN
INTRAMUSCULAR | Status: DC | PRN
  Administered 2019-04-25: 5 mL

## 2019-04-25 MED ORDER — LIDOCAINE HCL 1 % IJ SOLN
INTRAMUSCULAR | Status: AC
  Filled 2019-04-25: qty 20

## 2019-04-25 MED ORDER — ALBUMIN HUMAN 25 % IV SOLN
50.0000 g | Freq: Once | INTRAVENOUS | Status: AC
  Administered 2019-04-25: 50 g via INTRAVENOUS

## 2019-04-25 MED ORDER — ALBUMIN HUMAN 25 % IV SOLN
INTRAVENOUS | Status: AC
  Filled 2019-04-25: qty 200

## 2019-04-25 NOTE — Procedures (Signed)
PROCEDURE SUMMARY:  Successful US guided paracentesis from left lateral abdomen.  Yielded 6.0 liters of yellow fluid.  No immediate complications.  Pt tolerated well.   Specimen was not sent for labs.  EBL < 35mL  Docia Barrier PA-C 04/25/2019 9:34 AM

## 2019-05-07 ENCOUNTER — Other Ambulatory Visit: Payer: Self-pay | Admitting: *Deleted

## 2019-05-07 ENCOUNTER — Ambulatory Visit (HOSPITAL_COMMUNITY)
Admission: RE | Admit: 2019-05-07 | Discharge: 2019-05-07 | Disposition: A | Discharge status: Home / Self Care | Payer: Medicare Other | Source: Ambulatory Visit | Attending: Nephrology | Admitting: Nephrology

## 2019-05-07 ENCOUNTER — Other Ambulatory Visit: Payer: Self-pay

## 2019-05-07 VITALS — BP 123/63 | HR 53 | Temp 96.7°F | Resp 20

## 2019-05-07 DIAGNOSIS — N183 Chronic kidney disease, stage 3 unspecified: Secondary | ICD-10-CM | POA: Diagnosis present

## 2019-05-07 DIAGNOSIS — N186 End stage renal disease: Secondary | ICD-10-CM

## 2019-05-07 LAB — POCT HEMOGLOBIN-HEMACUE: Hemoglobin: 9.4 g/dL — ABNORMAL LOW (ref 13.0–17.0)

## 2019-05-07 MED ORDER — DARBEPOETIN ALFA 100 MCG/0.5ML IJ SOSY: 150.0000 ug | PREFILLED_SYRINGE | INTRAMUSCULAR | Status: DC

## 2019-05-07 MED ORDER — DARBEPOETIN ALFA 150 MCG/0.3ML IJ SOSY
PREFILLED_SYRINGE | INTRAMUSCULAR | Status: AC
  Administered 2019-05-07: 150 ug
  Filled 2019-05-07: qty 0.3

## 2019-05-08 ENCOUNTER — Encounter (HOSPITAL_COMMUNITY): Payer: Medicare Other

## 2019-05-08 ENCOUNTER — Encounter: Payer: Medicare Other | Admitting: Vascular Surgery

## 2019-05-09 ENCOUNTER — Other Ambulatory Visit: Payer: Self-pay

## 2019-05-09 ENCOUNTER — Ambulatory Visit (HOSPITAL_COMMUNITY)
Admission: RE | Admit: 2019-05-09 | Discharge: 2019-05-09 | Disposition: A | Discharge status: Home / Self Care | Payer: Medicare Other | Source: Ambulatory Visit | Attending: Gastroenterology | Admitting: Gastroenterology

## 2019-05-09 DIAGNOSIS — R188 Other ascites: Secondary | ICD-10-CM | POA: Insufficient documentation

## 2019-05-09 HISTORY — PX: IR PARACENTESIS: IMG2679

## 2019-05-09 MED ORDER — ALBUMIN HUMAN 25 % IV SOLN
50.0000 g | Freq: Once | INTRAVENOUS | Status: AC
  Administered 2019-05-09: 50 g via INTRAVENOUS
  Filled 2019-05-09: qty 200

## 2019-05-09 MED ORDER — ALBUMIN HUMAN 25 % IV SOLN
INTRAVENOUS | Status: AC
  Filled 2019-05-09: qty 200

## 2019-05-09 MED ORDER — LIDOCAINE HCL 1 % IJ SOLN
INTRAMUSCULAR | Status: AC
  Filled 2019-05-09: qty 20

## 2019-05-09 MED ORDER — LIDOCAINE HCL (PF) 1 % IJ SOLN
INTRAMUSCULAR | Status: DC | PRN
  Administered 2019-05-09: 10 mL

## 2019-05-09 NOTE — Procedures (Signed)
PROCEDURE SUMMARY:  Successful image-guided paracentesis from the right lower abdomen.  Yielded 7.4 liters of clear yellow fluid.  No immediate complications.  EBL: zero Patient tolerated well.   Specimen was not sent for labs.  Please see imaging section of Epic for full dictation.  Joaquim Nam PA-C 05/09/2019 8:58 AM

## 2019-05-21 ENCOUNTER — Ambulatory Visit (HOSPITAL_COMMUNITY)
Admission: RE | Admit: 2019-05-21 | Discharge: 2019-05-21 | Disposition: A | Discharge status: Home / Self Care | Payer: Medicare Other | Source: Ambulatory Visit | Attending: Nephrology | Admitting: Nephrology

## 2019-05-21 ENCOUNTER — Other Ambulatory Visit: Payer: Self-pay

## 2019-05-21 VITALS — BP 134/59 | HR 53 | Temp 96.9°F | Resp 20

## 2019-05-21 DIAGNOSIS — D631 Anemia in chronic kidney disease: Secondary | ICD-10-CM | POA: Diagnosis not present

## 2019-05-21 DIAGNOSIS — N183 Chronic kidney disease, stage 3 unspecified: Secondary | ICD-10-CM | POA: Insufficient documentation

## 2019-05-21 LAB — IRON AND TIBC
Iron: 75 ug/dL (ref 45–182)
Saturation Ratios: 31 % (ref 17.9–39.5)
TIBC: 239 ug/dL — ABNORMAL LOW (ref 250–450)
UIBC: 164 ug/dL

## 2019-05-21 LAB — POCT HEMOGLOBIN-HEMACUE: Hemoglobin: 9.4 g/dL — ABNORMAL LOW (ref 13.0–17.0)

## 2019-05-21 LAB — FERRITIN: Ferritin: 28 ng/mL (ref 24–336)

## 2019-05-21 MED ORDER — DARBEPOETIN ALFA 150 MCG/0.3ML IJ SOSY: 150.0000 ug | PREFILLED_SYRINGE | INTRAMUSCULAR | Status: DC

## 2019-05-21 MED ORDER — DARBEPOETIN ALFA 150 MCG/0.3ML IJ SOSY
PREFILLED_SYRINGE | INTRAMUSCULAR | Status: AC
  Administered 2019-05-21: 150 ug
  Filled 2019-05-21: qty 0.3

## 2019-05-23 ENCOUNTER — Other Ambulatory Visit: Payer: Self-pay

## 2019-05-23 ENCOUNTER — Ambulatory Visit (HOSPITAL_COMMUNITY)
Admission: RE | Admit: 2019-05-23 | Discharge: 2019-05-23 | Disposition: A | Discharge status: Home / Self Care | Payer: Medicare Other | Source: Ambulatory Visit | Attending: Gastroenterology | Admitting: Gastroenterology

## 2019-05-23 DIAGNOSIS — R188 Other ascites: Secondary | ICD-10-CM | POA: Insufficient documentation

## 2019-05-23 MED ORDER — LIDOCAINE HCL 1 % IJ SOLN
INTRAMUSCULAR | Status: AC
  Filled 2019-05-23: qty 20

## 2019-05-23 MED ORDER — ALBUMIN HUMAN 25 % IV SOLN
50.0000 g | Freq: Once | INTRAVENOUS | Status: AC
  Administered 2019-05-23: 50 g via INTRAVENOUS

## 2019-05-23 MED ORDER — ALBUMIN HUMAN 25 % IV SOLN
INTRAVENOUS | Status: AC
  Filled 2019-05-23: qty 200

## 2019-05-23 NOTE — Procedures (Signed)
PROCEDURE SUMMARY:  Successful image-guided paracentesis from the left lower abdomen.  Yielded 6.3 liters of clear yellow fluid.  No immediate complications.  EBL: zero Patient tolerated well.   Specimen was not sent for labs.  Please see imaging section of Epic for full dictation.  Joaquim Nam PA-C 05/23/2019 9:08 AM

## 2019-06-03 ENCOUNTER — Encounter (HOSPITAL_COMMUNITY): Payer: Self-pay

## 2019-06-03 ENCOUNTER — Other Ambulatory Visit (HOSPITAL_COMMUNITY): Payer: Self-pay | Admitting: Gastroenterology

## 2019-06-03 DIAGNOSIS — R188 Other ascites: Secondary | ICD-10-CM

## 2019-06-03 HISTORY — PX: IR PARACENTESIS: IMG2679

## 2019-06-04 ENCOUNTER — Other Ambulatory Visit: Payer: Self-pay

## 2019-06-04 ENCOUNTER — Encounter (HOSPITAL_COMMUNITY)
Admission: RE | Admit: 2019-06-04 | Discharge: 2019-06-04 | Disposition: A | Discharge status: Home / Self Care | Payer: Medicare Other | Source: Ambulatory Visit | Attending: Nephrology | Admitting: Nephrology

## 2019-06-04 VITALS — BP 133/56 | HR 51

## 2019-06-04 DIAGNOSIS — D631 Anemia in chronic kidney disease: Secondary | ICD-10-CM | POA: Diagnosis not present

## 2019-06-04 DIAGNOSIS — N183 Chronic kidney disease, stage 3 unspecified: Secondary | ICD-10-CM

## 2019-06-04 LAB — POCT HEMOGLOBIN-HEMACUE: Hemoglobin: 10.6 g/dL — ABNORMAL LOW (ref 13.0–17.0)

## 2019-06-04 MED ORDER — DARBEPOETIN ALFA 150 MCG/0.3ML IJ SOSY
PREFILLED_SYRINGE | INTRAMUSCULAR | Status: AC
  Filled 2019-06-04: qty 0.3

## 2019-06-04 MED ORDER — DARBEPOETIN ALFA 100 MCG/0.5ML IJ SOSY
150.0000 ug | PREFILLED_SYRINGE | INTRAMUSCULAR | Status: DC
  Administered 2019-06-04: 11:00:00 150 ug via SUBCUTANEOUS

## 2019-06-05 MED FILL — Darbepoetin Alfa Soln Prefilled Syringe 150 MCG/0.3ML: INTRAMUSCULAR | Qty: 0.3 | Status: AC

## 2019-06-06 ENCOUNTER — Other Ambulatory Visit: Payer: Self-pay

## 2019-06-06 ENCOUNTER — Other Ambulatory Visit (HOSPITAL_COMMUNITY): Payer: Self-pay | Admitting: Gastroenterology

## 2019-06-06 ENCOUNTER — Ambulatory Visit (HOSPITAL_COMMUNITY)
Admission: RE | Admit: 2019-06-06 | Discharge: 2019-06-06 | Disposition: A | Discharge status: Home / Self Care | Payer: Medicare Other | Source: Ambulatory Visit | Attending: Gastroenterology | Admitting: Gastroenterology

## 2019-06-06 DIAGNOSIS — R188 Other ascites: Secondary | ICD-10-CM | POA: Diagnosis present

## 2019-06-06 HISTORY — PX: IR PARACENTESIS: IMG2679

## 2019-06-06 MED ORDER — LIDOCAINE HCL (PF) 1 % IJ SOLN
INTRAMUSCULAR | Status: AC | PRN
  Administered 2019-06-06: 10 mL

## 2019-06-06 MED ORDER — LIDOCAINE HCL 1 % IJ SOLN
INTRAMUSCULAR | Status: AC
  Filled 2019-06-06: qty 20

## 2019-06-06 MED ORDER — ALBUMIN HUMAN 25 % IV SOLN
INTRAVENOUS | Status: AC
  Filled 2019-06-06: qty 200

## 2019-06-06 MED ORDER — ALBUMIN HUMAN 25 % IV SOLN
50.0000 g | Freq: Once | INTRAVENOUS | Status: AC
  Administered 2019-06-06: 50 g via INTRAVENOUS

## 2019-06-06 NOTE — Procedures (Signed)
PROCEDURE SUMMARY:  Successful US guided paracentesis from LLQ.  Yielded 6.2 L of clear yellow fluid.  No immediate complications.  Pt tolerated well.   Specimen was not sent for labs.  EBL < 25mL  Ascencion Dike PA-C 06/06/2019 10:32 AM

## 2019-06-10 ENCOUNTER — Emergency Department (HOSPITAL_COMMUNITY)
Admission: EM | Admit: 2019-06-10 | Discharge: 2019-06-11 | Disposition: A | Discharge status: Home / Self Care | Payer: Medicare Other | Attending: Emergency Medicine | Admitting: Emergency Medicine

## 2019-06-10 ENCOUNTER — Emergency Department (HOSPITAL_COMMUNITY): Payer: Medicare Other

## 2019-06-10 ENCOUNTER — Other Ambulatory Visit: Payer: Self-pay

## 2019-06-10 DIAGNOSIS — Z5321 Procedure and treatment not carried out due to patient leaving prior to being seen by health care provider: Secondary | ICD-10-CM | POA: Insufficient documentation

## 2019-06-10 DIAGNOSIS — M25552 Pain in left hip: Secondary | ICD-10-CM | POA: Insufficient documentation

## 2019-06-10 NOTE — ED Triage Notes (Signed)
Pt in POV, reports L hip pain X several weeks. Has not been able to see PCP yet. Starts in L lower back with radiation into L hip/L leg. Denies numbness/tinging.

## 2019-06-11 NOTE — ED Notes (Signed)
Called for vital signs with no answer.

## 2019-06-18 ENCOUNTER — Encounter (HOSPITAL_COMMUNITY)
Admission: RE | Admit: 2019-06-18 | Discharge: 2019-06-18 | Disposition: A | Discharge status: Home / Self Care | Payer: Medicare Other | Source: Ambulatory Visit | Attending: Nephrology | Admitting: Nephrology

## 2019-06-18 ENCOUNTER — Other Ambulatory Visit: Payer: Self-pay

## 2019-06-18 VITALS — BP 133/60 | HR 51 | Resp 18

## 2019-06-18 DIAGNOSIS — N183 Chronic kidney disease, stage 3 unspecified: Secondary | ICD-10-CM

## 2019-06-18 LAB — IRON AND TIBC
Iron: 62 ug/dL (ref 45–182)
Saturation Ratios: 28 % (ref 17.9–39.5)
TIBC: 218 ug/dL — ABNORMAL LOW (ref 250–450)
UIBC: 156 ug/dL

## 2019-06-18 LAB — POCT HEMOGLOBIN-HEMACUE: Hemoglobin: 11 g/dL — ABNORMAL LOW (ref 13.0–17.0)

## 2019-06-18 LAB — FERRITIN: Ferritin: 31 ng/mL (ref 24–336)

## 2019-06-18 MED ORDER — DARBEPOETIN ALFA 150 MCG/0.3ML IJ SOSY
150.0000 ug | PREFILLED_SYRINGE | INTRAMUSCULAR | Status: DC
  Administered 2019-06-18: 150 ug via SUBCUTANEOUS

## 2019-06-18 MED ORDER — DARBEPOETIN ALFA 150 MCG/0.3ML IJ SOSY
PREFILLED_SYRINGE | INTRAMUSCULAR | Status: AC
  Filled 2019-06-18: qty 0.3

## 2019-06-20 ENCOUNTER — Other Ambulatory Visit: Payer: Self-pay

## 2019-06-20 ENCOUNTER — Other Ambulatory Visit (HOSPITAL_COMMUNITY): Payer: Self-pay | Admitting: Gastroenterology

## 2019-06-20 ENCOUNTER — Ambulatory Visit (HOSPITAL_COMMUNITY)
Admission: RE | Admit: 2019-06-20 | Discharge: 2019-06-20 | Disposition: A | Discharge status: Home / Self Care | Payer: Medicare Other | Source: Ambulatory Visit | Attending: Gastroenterology | Admitting: Gastroenterology

## 2019-06-20 DIAGNOSIS — K7460 Unspecified cirrhosis of liver: Secondary | ICD-10-CM

## 2019-06-20 DIAGNOSIS — R188 Other ascites: Secondary | ICD-10-CM

## 2019-06-20 HISTORY — PX: IR PARACENTESIS: IMG2679

## 2019-06-20 MED ORDER — LIDOCAINE HCL 1 % IJ SOLN
INTRAMUSCULAR | Status: DC | PRN
  Administered 2019-06-20: 5 mL

## 2019-06-20 MED ORDER — ALBUMIN HUMAN 25 % IV SOLN
50.0000 g | Freq: Once | INTRAVENOUS | Status: AC
  Filled 2019-06-20: qty 200

## 2019-06-20 MED ORDER — ALBUMIN HUMAN 25 % IV SOLN
INTRAVENOUS | Status: AC
  Filled 2019-06-20: qty 100

## 2019-06-20 MED ORDER — ALBUMIN HUMAN 25 % IV SOLN
INTRAVENOUS | Status: AC
  Administered 2019-06-20: 50 g via INTRAVENOUS
  Filled 2019-06-20: qty 100

## 2019-06-20 MED ORDER — LIDOCAINE HCL 1 % IJ SOLN
INTRAMUSCULAR | Status: AC
  Filled 2019-06-20: qty 20

## 2019-06-20 NOTE — Procedures (Signed)
Ultrasound-guided  therapeutic paracentesis performed yielding 5.4 liters of yellow fluid. No immediate complications. The pt will receive IV albumin postprocedure. EBL none.

## 2019-07-02 ENCOUNTER — Encounter (HOSPITAL_COMMUNITY)
Admission: RE | Admit: 2019-07-02 | Discharge: 2019-07-02 | Disposition: A | Discharge status: Home / Self Care | Payer: Medicare Other | Source: Ambulatory Visit | Attending: Nephrology | Admitting: Nephrology

## 2019-07-02 ENCOUNTER — Other Ambulatory Visit: Payer: Self-pay

## 2019-07-02 VITALS — BP 147/63 | HR 56 | Temp 96.5°F

## 2019-07-02 DIAGNOSIS — D631 Anemia in chronic kidney disease: Secondary | ICD-10-CM | POA: Diagnosis not present

## 2019-07-02 DIAGNOSIS — N183 Chronic kidney disease, stage 3 unspecified: Secondary | ICD-10-CM | POA: Diagnosis present

## 2019-07-02 LAB — POCT HEMOGLOBIN-HEMACUE: Hemoglobin: 10.7 g/dL — ABNORMAL LOW (ref 13.0–17.0)

## 2019-07-02 MED ORDER — DARBEPOETIN ALFA 150 MCG/0.3ML IJ SOSY: 150.0000 ug | PREFILLED_SYRINGE | INTRAMUSCULAR | Status: DC

## 2019-07-02 MED ORDER — DARBEPOETIN ALFA 150 MCG/0.3ML IJ SOSY
PREFILLED_SYRINGE | INTRAMUSCULAR | Status: AC
  Administered 2019-07-02: 150 ug
  Filled 2019-07-02: qty 0.3

## 2019-07-03 ENCOUNTER — Ambulatory Visit (HOSPITAL_COMMUNITY)
Admission: RE | Admit: 2019-07-03 | Discharge: 2019-07-03 | Disposition: A | Discharge status: Home / Self Care | Payer: Medicare Other | Source: Ambulatory Visit | Attending: Gastroenterology | Admitting: Gastroenterology

## 2019-07-03 ENCOUNTER — Other Ambulatory Visit: Payer: Self-pay

## 2019-07-03 DIAGNOSIS — R188 Other ascites: Secondary | ICD-10-CM | POA: Diagnosis not present

## 2019-07-03 DIAGNOSIS — K7460 Unspecified cirrhosis of liver: Secondary | ICD-10-CM | POA: Diagnosis present

## 2019-07-03 HISTORY — PX: IR PARACENTESIS: IMG2679

## 2019-07-03 MED ORDER — LIDOCAINE HCL 1 % IJ SOLN
INTRAMUSCULAR | Status: AC | PRN
  Administered 2019-07-03: 10 mL

## 2019-07-03 MED ORDER — LIDOCAINE HCL 1 % IJ SOLN
INTRAMUSCULAR | Status: AC
  Filled 2019-07-03: qty 20

## 2019-07-03 NOTE — Procedures (Signed)
PROCEDURE SUMMARY:  Successful US guided paracentesis from left lateral abdomen.  Yielded 4. liters of yellow fluid.  No immediate complications.  Pt tolerated well.   Specimen was not sent for labs.  EBL < 67mL  Docia Barrier PA-C 07/03/2019 9:56 AM

## 2019-07-15 ENCOUNTER — Other Ambulatory Visit (HOSPITAL_COMMUNITY): Payer: Self-pay | Admitting: Gastroenterology

## 2019-07-15 ENCOUNTER — Other Ambulatory Visit: Payer: Self-pay | Admitting: Gastroenterology

## 2019-07-15 DIAGNOSIS — R188 Other ascites: Secondary | ICD-10-CM

## 2019-07-15 DIAGNOSIS — K7460 Unspecified cirrhosis of liver: Secondary | ICD-10-CM

## 2019-07-16 ENCOUNTER — Ambulatory Visit (HOSPITAL_COMMUNITY)
Admission: RE | Admit: 2019-07-16 | Discharge: 2019-07-16 | Disposition: A | Discharge status: Home / Self Care | Payer: Medicare Other | Source: Ambulatory Visit | Attending: Nephrology | Admitting: Nephrology

## 2019-07-16 ENCOUNTER — Other Ambulatory Visit: Payer: Self-pay

## 2019-07-16 VITALS — BP 147/73 | HR 54 | Temp 96.7°F | Resp 18

## 2019-07-16 DIAGNOSIS — D631 Anemia in chronic kidney disease: Secondary | ICD-10-CM | POA: Diagnosis not present

## 2019-07-16 DIAGNOSIS — N183 Chronic kidney disease, stage 3 unspecified: Secondary | ICD-10-CM | POA: Diagnosis present

## 2019-07-16 LAB — POCT HEMOGLOBIN-HEMACUE: Hemoglobin: 10.1 g/dL — ABNORMAL LOW (ref 13.0–17.0)

## 2019-07-16 LAB — IRON AND TIBC
Iron: 95 ug/dL (ref 45–182)
Saturation Ratios: 37 % (ref 17.9–39.5)
TIBC: 259 ug/dL (ref 250–450)
UIBC: 164 ug/dL

## 2019-07-16 LAB — FERRITIN: Ferritin: 24 ng/mL (ref 24–336)

## 2019-07-16 MED ORDER — DARBEPOETIN ALFA 150 MCG/0.3ML IJ SOSY
PREFILLED_SYRINGE | INTRAMUSCULAR | Status: AC
  Administered 2019-07-16: 150 ug
  Filled 2019-07-16: qty 0.3

## 2019-07-16 MED ORDER — DARBEPOETIN ALFA 100 MCG/0.5ML IJ SOSY: 150.0000 ug | PREFILLED_SYRINGE | INTRAMUSCULAR | Status: DC

## 2019-07-19 ENCOUNTER — Ambulatory Visit (HOSPITAL_COMMUNITY)
Admission: RE | Admit: 2019-07-19 | Discharge: 2019-07-19 | Disposition: A | Discharge status: Home / Self Care | Payer: Medicare Other | Source: Ambulatory Visit | Attending: Gastroenterology | Admitting: Gastroenterology

## 2019-07-19 ENCOUNTER — Other Ambulatory Visit: Payer: Self-pay

## 2019-07-19 DIAGNOSIS — R188 Other ascites: Secondary | ICD-10-CM | POA: Insufficient documentation

## 2019-07-19 DIAGNOSIS — K7460 Unspecified cirrhosis of liver: Secondary | ICD-10-CM

## 2019-07-19 HISTORY — PX: IR PARACENTESIS: IMG2679

## 2019-07-19 MED ORDER — LIDOCAINE HCL 1 % IJ SOLN
INTRAMUSCULAR | Status: DC | PRN
  Administered 2019-07-19: 10 mL

## 2019-07-19 MED ORDER — LIDOCAINE HCL 1 % IJ SOLN
INTRAMUSCULAR | Status: AC
  Filled 2019-07-19: qty 20

## 2019-07-19 NOTE — Procedures (Signed)
PROCEDURE SUMMARY:  Successful US guided paracentesis from right lateral abdomen.  Yielded 4.4 liters of yellow fluid.  No immediate complications.  Pt tolerated well.   Specimen was not sent for labs.  EBL < 20mL  Docia Barrier PA-C 07/19/2019 1:40 PM

## 2019-07-30 ENCOUNTER — Other Ambulatory Visit: Payer: Self-pay

## 2019-07-30 ENCOUNTER — Ambulatory Visit (HOSPITAL_COMMUNITY)
Admission: RE | Admit: 2019-07-30 | Discharge: 2019-07-30 | Disposition: A | Discharge status: Home / Self Care | Payer: Medicare Other | Source: Ambulatory Visit | Attending: Nephrology | Admitting: Nephrology

## 2019-07-30 VITALS — BP 112/55 | HR 52 | Temp 96.3°F

## 2019-07-30 DIAGNOSIS — N183 Chronic kidney disease, stage 3 unspecified: Secondary | ICD-10-CM | POA: Diagnosis present

## 2019-07-30 LAB — POCT HEMOGLOBIN-HEMACUE: Hemoglobin: 10.4 g/dL — ABNORMAL LOW (ref 13.0–17.0)

## 2019-07-30 MED ORDER — DARBEPOETIN ALFA 150 MCG/0.3ML IJ SOSY
PREFILLED_SYRINGE | INTRAMUSCULAR | Status: AC
  Filled 2019-07-30: qty 0.3

## 2019-07-30 MED ORDER — DARBEPOETIN ALFA 150 MCG/0.3ML IJ SOSY
150.0000 ug | PREFILLED_SYRINGE | INTRAMUSCULAR | Status: DC
  Administered 2019-07-30: 150 ug via SUBCUTANEOUS

## 2019-07-31 ENCOUNTER — Other Ambulatory Visit: Payer: Self-pay | Admitting: Gastroenterology

## 2019-07-31 ENCOUNTER — Other Ambulatory Visit (HOSPITAL_COMMUNITY): Payer: Self-pay | Admitting: Gastroenterology

## 2019-07-31 DIAGNOSIS — K7460 Unspecified cirrhosis of liver: Secondary | ICD-10-CM

## 2019-07-31 DIAGNOSIS — R188 Other ascites: Secondary | ICD-10-CM

## 2019-08-01 ENCOUNTER — Other Ambulatory Visit (HOSPITAL_COMMUNITY): Payer: Self-pay | Admitting: Gastroenterology

## 2019-08-01 ENCOUNTER — Telehealth (HOSPITAL_COMMUNITY): Payer: Self-pay | Admitting: Gastroenterology

## 2019-08-01 DIAGNOSIS — K7460 Unspecified cirrhosis of liver: Secondary | ICD-10-CM

## 2019-08-01 DIAGNOSIS — R188 Other ascites: Secondary | ICD-10-CM

## 2019-08-01 NOTE — Telephone Encounter (Signed)
08/01/19~LM on Hm VM. MF

## 2019-08-08 ENCOUNTER — Ambulatory Visit (HOSPITAL_COMMUNITY)
Admission: RE | Admit: 2019-08-08 | Discharge: 2019-08-08 | Disposition: A | Discharge status: Home / Self Care | Payer: Medicare Other | Source: Ambulatory Visit | Attending: Gastroenterology | Admitting: Gastroenterology

## 2019-08-08 DIAGNOSIS — K7460 Unspecified cirrhosis of liver: Secondary | ICD-10-CM | POA: Diagnosis present

## 2019-08-08 DIAGNOSIS — R188 Other ascites: Secondary | ICD-10-CM | POA: Diagnosis not present

## 2019-08-08 MED ORDER — ALBUMIN HUMAN 25 % IV SOLN
50.0000 g | Freq: Once | INTRAVENOUS | Status: AC
  Administered 2019-08-08: 50 g via INTRAVENOUS
  Filled 2019-08-08: qty 200

## 2019-08-08 MED ORDER — LIDOCAINE HCL 1 % IJ SOLN
INTRAMUSCULAR | Status: DC | PRN
  Administered 2019-08-08: 10 mL

## 2019-08-08 MED ORDER — ALBUMIN HUMAN 25 % IV SOLN
INTRAVENOUS | Status: AC
  Filled 2019-08-08: qty 200

## 2019-08-08 MED ORDER — LIDOCAINE HCL 1 % IJ SOLN
INTRAMUSCULAR | Status: AC
  Filled 2019-08-08: qty 20

## 2019-08-08 NOTE — Procedures (Signed)
PROCEDURE SUMMARY:  Successful image-guided paracentesis from the left lower abdomen.  Yielded 5.7 liters of clear, light yellow fluid.  No immediate complications.  EBL: zero Patient tolerated well.   Specimen was not sent for labs.  Please see imaging section of Epic for full dictation.  Joaquim Nam PA-C 08/08/2019 8:46 AM

## 2019-08-13 ENCOUNTER — Other Ambulatory Visit: Payer: Self-pay

## 2019-08-13 ENCOUNTER — Ambulatory Visit (HOSPITAL_COMMUNITY)
Admission: RE | Admit: 2019-08-13 | Discharge: 2019-08-13 | Disposition: A | Discharge status: Home / Self Care | Payer: Medicare Other | Source: Ambulatory Visit | Attending: Nephrology | Admitting: Nephrology

## 2019-08-13 VITALS — BP 115/54 | HR 102 | Temp 95.2°F | Ht 72.0 in | Wt 160.0 lb

## 2019-08-13 DIAGNOSIS — N183 Chronic kidney disease, stage 3 unspecified: Secondary | ICD-10-CM | POA: Diagnosis not present

## 2019-08-13 DIAGNOSIS — D631 Anemia in chronic kidney disease: Secondary | ICD-10-CM | POA: Insufficient documentation

## 2019-08-13 LAB — FERRITIN: Ferritin: 23 ng/mL — ABNORMAL LOW (ref 24–336)

## 2019-08-13 LAB — IRON AND TIBC
Iron: 84 ug/dL (ref 45–182)
Saturation Ratios: 37 % (ref 17.9–39.5)
TIBC: 225 ug/dL — ABNORMAL LOW (ref 250–450)
UIBC: 141 ug/dL

## 2019-08-13 LAB — POCT HEMOGLOBIN-HEMACUE: Hemoglobin: 10.7 g/dL — ABNORMAL LOW (ref 13.0–17.0)

## 2019-08-13 MED ORDER — DARBEPOETIN ALFA 150 MCG/0.3ML IJ SOSY: 150.0000 ug | PREFILLED_SYRINGE | INTRAMUSCULAR | Status: DC

## 2019-08-13 MED ORDER — DARBEPOETIN ALFA 150 MCG/0.3ML IJ SOSY
PREFILLED_SYRINGE | INTRAMUSCULAR | Status: AC
  Administered 2019-08-13: 150 ug via SUBCUTANEOUS
  Filled 2019-08-13: qty 0.3

## 2019-08-14 NOTE — Addendum Note (Signed)
Encounter addended by: Richrd Sox F on: 08/14/2019 11:47 AM  Actions taken: Imaging Exam ended

## 2019-08-14 NOTE — Addendum Note (Signed)
Encounter addended by: Richrd Sox F on: 08/14/2019 11:44 AM  Actions taken: Imaging Exam ended

## 2019-08-15 ENCOUNTER — Encounter: Payer: Self-pay | Admitting: Surgery

## 2019-08-27 ENCOUNTER — Ambulatory Visit (HOSPITAL_COMMUNITY)
Admission: RE | Admit: 2019-08-27 | Discharge: 2019-08-27 | Disposition: A | Discharge status: Home / Self Care | Payer: Medicare Other | Source: Ambulatory Visit | Attending: Gastroenterology | Admitting: Gastroenterology

## 2019-08-27 ENCOUNTER — Encounter (HOSPITAL_COMMUNITY): Payer: Medicare Other

## 2019-08-27 ENCOUNTER — Encounter (HOSPITAL_COMMUNITY): Payer: Self-pay

## 2019-08-27 DIAGNOSIS — R188 Other ascites: Secondary | ICD-10-CM | POA: Insufficient documentation

## 2019-08-27 DIAGNOSIS — K7460 Unspecified cirrhosis of liver: Secondary | ICD-10-CM

## 2019-08-27 HISTORY — PX: IR PARACENTESIS: IMG2679

## 2019-08-27 MED ORDER — ALBUMIN HUMAN 25 % IV SOLN
INTRAVENOUS | Status: AC
  Filled 2019-08-27: qty 200

## 2019-08-27 MED ORDER — LIDOCAINE HCL 1 % IJ SOLN
INTRAMUSCULAR | Status: DC | PRN
  Administered 2019-08-27: 10 mL

## 2019-08-27 MED ORDER — LIDOCAINE HCL 1 % IJ SOLN
INTRAMUSCULAR | Status: AC
  Filled 2019-08-27: qty 20

## 2019-08-27 MED ORDER — ALBUMIN HUMAN 25 % IV SOLN
50.0000 g | Freq: Once | INTRAVENOUS | Status: AC
  Administered 2019-08-27: 50 g via INTRAVENOUS

## 2019-08-27 NOTE — Procedures (Signed)
PROCEDURE SUMMARY:  Successful US guided paracentesis from left lateral abdomen.  Yielded 7.5 L of clear yellow fluid.  No immediate complications.  Pt tolerated well.   EBL < 33mL  Senna Lape, AGACNP-BC [phone] 08/27/2019, 11:19 AM

## 2019-09-09 ENCOUNTER — Other Ambulatory Visit: Payer: Self-pay

## 2019-09-09 ENCOUNTER — Other Ambulatory Visit (HOSPITAL_COMMUNITY): Payer: Medicare Other

## 2019-09-09 ENCOUNTER — Ambulatory Visit (HOSPITAL_COMMUNITY)
Admission: RE | Admit: 2019-09-09 | Discharge: 2019-09-09 | Disposition: A | Discharge status: Home / Self Care | Payer: Medicare Other | Source: Ambulatory Visit | Attending: Gastroenterology | Admitting: Gastroenterology

## 2019-09-09 DIAGNOSIS — K7460 Unspecified cirrhosis of liver: Secondary | ICD-10-CM | POA: Diagnosis not present

## 2019-09-09 DIAGNOSIS — R188 Other ascites: Secondary | ICD-10-CM | POA: Diagnosis not present

## 2019-09-09 HISTORY — PX: IR PARACENTESIS: IMG2679

## 2019-09-09 MED ORDER — ALBUMIN HUMAN 25 % IV SOLN
50.0000 g | Freq: Once | INTRAVENOUS | Status: AC
  Administered 2019-09-09: 50 g via INTRAVENOUS

## 2019-09-09 MED ORDER — ALBUMIN HUMAN 25 % IV SOLN
INTRAVENOUS | Status: AC
  Filled 2019-09-09: qty 200

## 2019-09-09 MED ORDER — LIDOCAINE HCL 1 % IJ SOLN
INTRAMUSCULAR | Status: AC
  Filled 2019-09-09: qty 20

## 2019-09-09 MED ORDER — LIDOCAINE HCL (PF) 1 % IJ SOLN
INTRAMUSCULAR | Status: DC | PRN
  Administered 2019-09-09: 5 mL

## 2019-09-09 NOTE — Procedures (Signed)
PROCEDURE SUMMARY:  Successful US guided paracentesis from right lateral abdomen.  Yielded 7.5 liters of yellow fluid.  No immediate complications.  Pt tolerated well.   Specimen was not sent for labs.  EBL < 65mL  Docia Barrier PA-C 09/09/2019 5:02 PM

## 2019-09-10 ENCOUNTER — Encounter (HOSPITAL_COMMUNITY): Payer: Medicare Other

## 2019-09-18 ENCOUNTER — Other Ambulatory Visit (HOSPITAL_COMMUNITY): Payer: Self-pay | Admitting: Gastroenterology

## 2019-09-18 ENCOUNTER — Other Ambulatory Visit (HOSPITAL_COMMUNITY): Payer: Self-pay

## 2019-09-18 DIAGNOSIS — K7460 Unspecified cirrhosis of liver: Secondary | ICD-10-CM

## 2019-09-18 DIAGNOSIS — R188 Other ascites: Secondary | ICD-10-CM

## 2019-09-18 DIAGNOSIS — K7030 Alcoholic cirrhosis of liver without ascites: Secondary | ICD-10-CM

## 2019-09-19 ENCOUNTER — Other Ambulatory Visit: Payer: Self-pay

## 2019-09-19 DIAGNOSIS — N186 End stage renal disease: Secondary | ICD-10-CM

## 2019-09-23 ENCOUNTER — Inpatient Hospital Stay (HOSPITAL_COMMUNITY)
Admission: EM | Admit: 2019-09-23 | Discharge: 2019-09-27 | DRG: 682 | Disposition: A | Discharge status: Home Health | Payer: Medicare Other | Attending: Family Medicine | Admitting: Family Medicine

## 2019-09-23 ENCOUNTER — Inpatient Hospital Stay (HOSPITAL_COMMUNITY): Payer: Medicare Other

## 2019-09-23 ENCOUNTER — Ambulatory Visit (HOSPITAL_COMMUNITY): Admission: RE | Admit: 2019-09-23 | Payer: Medicare Other | Source: Ambulatory Visit

## 2019-09-23 ENCOUNTER — Other Ambulatory Visit: Payer: Self-pay

## 2019-09-23 DIAGNOSIS — E039 Hypothyroidism, unspecified: Secondary | ICD-10-CM | POA: Diagnosis present

## 2019-09-23 DIAGNOSIS — D539 Nutritional anemia, unspecified: Secondary | ICD-10-CM | POA: Diagnosis present

## 2019-09-23 DIAGNOSIS — R001 Bradycardia, unspecified: Secondary | ICD-10-CM | POA: Diagnosis present

## 2019-09-23 DIAGNOSIS — R55 Syncope and collapse: Secondary | ICD-10-CM | POA: Diagnosis present

## 2019-09-23 DIAGNOSIS — I2510 Atherosclerotic heart disease of native coronary artery without angina pectoris: Secondary | ICD-10-CM | POA: Diagnosis present

## 2019-09-23 DIAGNOSIS — Z7982 Long term (current) use of aspirin: Secondary | ICD-10-CM

## 2019-09-23 DIAGNOSIS — N185 Chronic kidney disease, stage 5: Secondary | ICD-10-CM | POA: Diagnosis not present

## 2019-09-23 DIAGNOSIS — R7303 Prediabetes: Secondary | ICD-10-CM | POA: Diagnosis present

## 2019-09-23 DIAGNOSIS — I10 Essential (primary) hypertension: Secondary | ICD-10-CM | POA: Diagnosis present

## 2019-09-23 DIAGNOSIS — S0003XA Contusion of scalp, initial encounter: Secondary | ICD-10-CM

## 2019-09-23 DIAGNOSIS — N200 Calculus of kidney: Secondary | ICD-10-CM | POA: Diagnosis present

## 2019-09-23 DIAGNOSIS — Z888 Allergy status to other drugs, medicaments and biological substances status: Secondary | ICD-10-CM

## 2019-09-23 DIAGNOSIS — Z833 Family history of diabetes mellitus: Secondary | ICD-10-CM

## 2019-09-23 DIAGNOSIS — R188 Other ascites: Secondary | ICD-10-CM | POA: Diagnosis present

## 2019-09-23 DIAGNOSIS — E162 Hypoglycemia, unspecified: Secondary | ICD-10-CM | POA: Diagnosis present

## 2019-09-23 DIAGNOSIS — D631 Anemia in chronic kidney disease: Secondary | ICD-10-CM | POA: Diagnosis present

## 2019-09-23 DIAGNOSIS — E875 Hyperkalemia: Secondary | ICD-10-CM | POA: Diagnosis present

## 2019-09-23 DIAGNOSIS — I120 Hypertensive chronic kidney disease with stage 5 chronic kidney disease or end stage renal disease: Principal | ICD-10-CM | POA: Diagnosis present

## 2019-09-23 DIAGNOSIS — N189 Chronic kidney disease, unspecified: Secondary | ICD-10-CM

## 2019-09-23 DIAGNOSIS — Z961 Presence of intraocular lens: Secondary | ICD-10-CM | POA: Diagnosis present

## 2019-09-23 DIAGNOSIS — E872 Acidosis: Secondary | ICD-10-CM | POA: Diagnosis present

## 2019-09-23 DIAGNOSIS — Z79891 Long term (current) use of opiate analgesic: Secondary | ICD-10-CM

## 2019-09-23 DIAGNOSIS — D6959 Other secondary thrombocytopenia: Secondary | ICD-10-CM | POA: Diagnosis present

## 2019-09-23 DIAGNOSIS — Z960 Presence of urogenital implants: Secondary | ICD-10-CM | POA: Diagnosis present

## 2019-09-23 DIAGNOSIS — I701 Atherosclerosis of renal artery: Secondary | ICD-10-CM | POA: Diagnosis present

## 2019-09-23 DIAGNOSIS — Z9842 Cataract extraction status, left eye: Secondary | ICD-10-CM

## 2019-09-23 DIAGNOSIS — Z9181 History of falling: Secondary | ICD-10-CM

## 2019-09-23 DIAGNOSIS — Z79899 Other long term (current) drug therapy: Secondary | ICD-10-CM

## 2019-09-23 DIAGNOSIS — Z823 Family history of stroke: Secondary | ICD-10-CM

## 2019-09-23 DIAGNOSIS — M109 Gout, unspecified: Secondary | ICD-10-CM | POA: Diagnosis present

## 2019-09-23 DIAGNOSIS — N186 End stage renal disease: Secondary | ICD-10-CM | POA: Diagnosis present

## 2019-09-23 DIAGNOSIS — K7469 Other cirrhosis of liver: Secondary | ICD-10-CM | POA: Diagnosis present

## 2019-09-23 DIAGNOSIS — Z8546 Personal history of malignant neoplasm of prostate: Secondary | ICD-10-CM | POA: Diagnosis not present

## 2019-09-23 DIAGNOSIS — I714 Abdominal aortic aneurysm, without rupture, unspecified: Secondary | ICD-10-CM | POA: Diagnosis present

## 2019-09-23 DIAGNOSIS — Z973 Presence of spectacles and contact lenses: Secondary | ICD-10-CM

## 2019-09-23 DIAGNOSIS — R339 Retention of urine, unspecified: Secondary | ICD-10-CM | POA: Diagnosis present

## 2019-09-23 DIAGNOSIS — E785 Hyperlipidemia, unspecified: Secondary | ICD-10-CM | POA: Diagnosis present

## 2019-09-23 DIAGNOSIS — N289 Disorder of kidney and ureter, unspecified: Secondary | ICD-10-CM | POA: Diagnosis not present

## 2019-09-23 DIAGNOSIS — Z8679 Personal history of other diseases of the circulatory system: Secondary | ICD-10-CM

## 2019-09-23 DIAGNOSIS — Z79890 Hormone replacement therapy (postmenopausal): Secondary | ICD-10-CM

## 2019-09-23 DIAGNOSIS — Z20822 Contact with and (suspected) exposure to covid-19: Secondary | ICD-10-CM | POA: Diagnosis present

## 2019-09-23 DIAGNOSIS — I350 Nonrheumatic aortic (valve) stenosis: Secondary | ICD-10-CM | POA: Diagnosis not present

## 2019-09-23 DIAGNOSIS — Z9841 Cataract extraction status, right eye: Secondary | ICD-10-CM

## 2019-09-23 LAB — CBG MONITORING, ED
Glucose-Capillary: 90 mg/dL (ref 70–99)
Glucose-Capillary: 65 mg/dL — ABNORMAL LOW (ref 70–99)
Glucose-Capillary: 104 mg/dL — ABNORMAL HIGH (ref 70–99)

## 2019-09-23 LAB — BASIC METABOLIC PANEL
Anion gap: 7 (ref 5–15)
Anion gap: 10 (ref 5–15)
Anion gap: 8 (ref 5–15)
BUN: 79 mg/dL — ABNORMAL HIGH (ref 8–23)
BUN: 78 mg/dL — ABNORMAL HIGH (ref 8–23)
BUN: 79 mg/dL — ABNORMAL HIGH (ref 8–23)
CO2: 16 mmol/L — ABNORMAL LOW (ref 22–32)
CO2: 15 mmol/L — ABNORMAL LOW (ref 22–32)
CO2: 15 mmol/L — ABNORMAL LOW (ref 22–32)
Calcium: 7.8 mg/dL — ABNORMAL LOW (ref 8.9–10.3)
Calcium: 7.7 mg/dL — ABNORMAL LOW (ref 8.9–10.3)
Calcium: 7.7 mg/dL — ABNORMAL LOW (ref 8.9–10.3)
Chloride: 112 mmol/L — ABNORMAL HIGH (ref 98–111)
Chloride: 112 mmol/L — ABNORMAL HIGH (ref 98–111)
Chloride: 114 mmol/L — ABNORMAL HIGH (ref 98–111)
Creatinine, Ser: 6.05 mg/dL — ABNORMAL HIGH (ref 0.61–1.24)
Creatinine, Ser: 6 mg/dL — ABNORMAL HIGH (ref 0.61–1.24)
Creatinine, Ser: 5.84 mg/dL — ABNORMAL HIGH (ref 0.61–1.24)
GFR calc Af Amer: 10 mL/min — ABNORMAL LOW (ref >60)
GFR calc Af Amer: 10 mL/min — ABNORMAL LOW (ref >60)
GFR calc Af Amer: 10 mL/min — ABNORMAL LOW (ref >60)
GFR calc non Af Amer: 8 mL/min — ABNORMAL LOW (ref >60)
GFR calc non Af Amer: 8 mL/min — ABNORMAL LOW (ref >60)
GFR calc non Af Amer: 9 mL/min — ABNORMAL LOW (ref >60)
Glucose, Bld: 104 mg/dL — ABNORMAL HIGH (ref 70–99)
Glucose, Bld: 62 mg/dL — ABNORMAL LOW (ref 70–99)
Glucose, Bld: 98 mg/dL (ref 70–99)
Potassium: >7.5 mmol/L (ref 3.5–5.1)
Potassium: 7.2 mmol/L (ref 3.5–5.1)
Potassium: >7.5 mmol/L (ref 3.5–5.1)
Sodium: 135 mmol/L (ref 135–145)
Sodium: 137 mmol/L (ref 135–145)
Sodium: 137 mmol/L (ref 135–145)

## 2019-09-23 LAB — CBC
HCT: 32.2 % — ABNORMAL LOW (ref 39.0–52.0)
Hemoglobin: 9.7 g/dL — ABNORMAL LOW (ref 13.0–17.0)
MCH: 31.1 pg (ref 26.0–34.0)
MCHC: 30.1 g/dL (ref 30.0–36.0)
MCV: 103.2 fL — ABNORMAL HIGH (ref 80.0–100.0)
Platelets: 142 10*3/uL — ABNORMAL LOW (ref 150–400)
RBC: 3.12 MIL/uL — ABNORMAL LOW (ref 4.22–5.81)
RDW: 15.2 % (ref 11.5–15.5)
WBC: 5.8 10*3/uL (ref 4.0–10.5)
nRBC: 0 % (ref 0.0–0.2)

## 2019-09-23 LAB — IRON AND TIBC
Iron: 116 ug/dL (ref 45–182)
Saturation Ratios: 64 % — ABNORMAL HIGH (ref 17.9–39.5)
TIBC: 182 ug/dL — ABNORMAL LOW (ref 250–450)
UIBC: 66 ug/dL

## 2019-09-23 LAB — BRAIN NATRIURETIC PEPTIDE: B Natriuretic Peptide: 490.9 pg/mL — ABNORMAL HIGH (ref 0.0–100.0)

## 2019-09-23 LAB — POTASSIUM: Potassium: 5.8 mmol/L — ABNORMAL HIGH (ref 3.5–5.1)

## 2019-09-23 LAB — FOLATE: Folate: 20.2 ng/mL (ref >5.9)

## 2019-09-23 LAB — FERRITIN: Ferritin: 165 ng/mL (ref 24–336)

## 2019-09-23 LAB — VITAMIN B12: Vitamin B-12: 208 pg/mL (ref 180–914)

## 2019-09-23 LAB — SARS CORONAVIRUS 2 BY RT PCR (HOSPITAL ORDER, PERFORMED IN ~~LOC~~ HOSPITAL LAB): SARS Coronavirus 2: NEGATIVE

## 2019-09-23 MED ORDER — ASPIRIN EC 81 MG PO TBEC: 81.0000 mg | DELAYED_RELEASE_TABLET | Freq: Every day | ORAL | Status: DC

## 2019-09-23 MED ORDER — DEXTROSE 50 % IV SOLN
1.0000 | Freq: Once | INTRAVENOUS | Status: AC
  Administered 2019-09-23: 50 mL via INTRAVENOUS
  Filled 2019-09-23: qty 50

## 2019-09-23 MED ORDER — SODIUM CHLORIDE 0.9 % IV SOLN: INTRAVENOUS | Status: DC

## 2019-09-23 MED ORDER — SODIUM ZIRCONIUM CYCLOSILICATE 5 G PO PACK
5.0000 g | PACK | Freq: Once | ORAL | Status: AC
  Administered 2019-09-23: 5 g via ORAL
  Filled 2019-09-23: qty 1

## 2019-09-23 MED ORDER — CHLORHEXIDINE GLUCONATE CLOTH 2 % EX PADS
6.0000 | MEDICATED_PAD | Freq: Every day | CUTANEOUS | Status: DC
  Administered 2019-09-25: 6 via TOPICAL

## 2019-09-23 MED ORDER — INSULIN ASPART 100 UNIT/ML ~~LOC~~ SOLN
10.0000 [IU] | Freq: Once | SUBCUTANEOUS | Status: AC
  Administered 2019-09-23: 10 [IU] via INTRAVENOUS

## 2019-09-23 MED ORDER — DEXTROSE 50 % IV SOLN
50.0000 mL | Freq: Once | INTRAVENOUS | Status: AC
  Administered 2019-09-23: 50 mL via INTRAVENOUS
  Filled 2019-09-23: qty 50

## 2019-09-23 MED ORDER — AMLODIPINE BESYLATE 5 MG PO TABS
5.0000 mg | ORAL_TABLET | Freq: Every day | ORAL | Status: DC
  Administered 2019-09-24: 5 mg via ORAL
  Filled 2019-09-23: qty 1

## 2019-09-23 MED ORDER — LEVOTHYROXINE SODIUM 50 MCG PO TABS
50.0000 ug | ORAL_TABLET | Freq: Every day | ORAL | Status: DC
  Administered 2019-09-24 – 2019-09-26 (×3): 50 ug via ORAL
  Filled 2019-09-23 (×3): qty 1

## 2019-09-23 MED ORDER — SODIUM CHLORIDE 0.9% FLUSH
3.0000 mL | Freq: Once | INTRAVENOUS | Status: AC
  Administered 2019-09-23: 3 mL via INTRAVENOUS

## 2019-09-23 MED ORDER — FEBUXOSTAT 40 MG PO TABS
40.0000 mg | ORAL_TABLET | Freq: Every day | ORAL | Status: DC
  Administered 2019-09-24 – 2019-09-26 (×3): 40 mg via ORAL
  Filled 2019-09-23 (×5): qty 1

## 2019-09-23 MED ORDER — CLONIDINE HCL 0.1 MG PO TABS
0.2000 mg | ORAL_TABLET | Freq: Two times a day (BID) | ORAL | Status: DC
  Administered 2019-09-24 – 2019-09-25 (×2): 0.2 mg via ORAL
  Filled 2019-09-23 (×2): qty 2

## 2019-09-23 MED ORDER — SODIUM BICARBONATE 650 MG PO TABS
650.0000 mg | ORAL_TABLET | Freq: Two times a day (BID) | ORAL | Status: DC
  Administered 2019-09-24: 650 mg via ORAL
  Filled 2019-09-23 (×2): qty 1

## 2019-09-23 MED ORDER — CALCIUM GLUCONATE-NACL 1-0.675 GM/50ML-% IV SOLN
1.0000 g | Freq: Once | INTRAVENOUS | Status: AC
  Administered 2019-09-23: 1000 mg via INTRAVENOUS
  Filled 2019-09-23: qty 50

## 2019-09-23 MED ORDER — SODIUM ZIRCONIUM CYCLOSILICATE 10 G PO PACK
10.0000 g | PACK | Freq: Once | ORAL | Status: AC
  Administered 2019-09-23: 10 g via ORAL
  Filled 2019-09-23: qty 1

## 2019-09-23 MED ORDER — SODIUM BICARBONATE 8.4 % IV SOLN
50.0000 meq | Freq: Once | INTRAVENOUS | Status: AC
  Administered 2019-09-23: 50 meq via INTRAVENOUS
  Filled 2019-09-23: qty 50

## 2019-09-23 MED ORDER — CHLORHEXIDINE GLUCONATE CLOTH 2 % EX PADS: 6.0000 | MEDICATED_PAD | Freq: Every day | CUTANEOUS | Status: DC

## 2019-09-23 MED ORDER — PANTOPRAZOLE SODIUM 40 MG PO TBEC
40.0000 mg | DELAYED_RELEASE_TABLET | Freq: Every day | ORAL | Status: DC
  Administered 2019-09-24 – 2019-09-27 (×4): 40 mg via ORAL
  Filled 2019-09-23 (×4): qty 1

## 2019-09-23 MED ORDER — CARVEDILOL 12.5 MG PO TABS
12.5000 mg | ORAL_TABLET | Freq: Two times a day (BID) | ORAL | Status: DC
  Administered 2019-09-24 – 2019-09-27 (×5): 12.5 mg via ORAL
  Filled 2019-09-23 (×5): qty 1

## 2019-09-23 MED ORDER — ATORVASTATIN CALCIUM 10 MG PO TABS
10.0000 mg | ORAL_TABLET | Freq: Every day | ORAL | Status: DC
  Administered 2019-09-25 – 2019-09-26 (×2): 10 mg via ORAL
  Filled 2019-09-23 (×2): qty 1

## 2019-09-23 NOTE — ED Triage Notes (Signed)
Pt here for paracentesis, got out of car, got dizzy and fell. Pt with hematoma to L posterior head. No neurologic deficits noted. No blood thinners. No LOC.

## 2019-09-23 NOTE — ED Notes (Signed)
Dr. Royce Macadamia returned RN paged and is aware of pt new potassium result.

## 2019-09-23 NOTE — Progress Notes (Signed)
Time was 2122

## 2019-09-23 NOTE — Progress Notes (Addendum)
FPTS Interim Progress Note  Spoke with Dr. Royce Macadamia who has seen patient.  Plans to take for HD tonight.  Asks that glucose is closely watched given that he received 10u insulin in ED.  Will order CBGs q2h to ensure stabilization of glucose.  Appreciate Dr. Luis Abed care and recommendations.  [place], DO 09/23/2019, 8:46 PM PGY-3, [place] [facility] pager [phone]

## 2019-09-23 NOTE — ED Provider Notes (Signed)
[facility] EMERGENCY DEPARTMENT Provider Note   CSN: [id number] Arrival date & time: 09/23/19  1335     History Chief Complaint  Patient presents with  . Dizziness  . Fall    Ricky Harris is a 79 y.o. male.  HPI   79 year old male presenting after fall.  He came to the hospital for a scheduled paracentesis.  As he was getting out of the car he felt dizzy and fell.  He did strike his head.  He did not lose consciousness.  Complaining of a headache.  He states that he generally does not feel well but denies any acute pain elsewhere.  He is not anticoagulated.  Past Medical History:  Diagnosis Date  . AAA (abdominal aortic aneurysm) ([place])    5.3cm , magd by vascular Dr Scot Dock ,  . Anemia    " slightly"  . Aortic stenosis   . Arthritis    knees  . Ascites   . Cancer [facility])    prostate  . Chronic kidney disease    stage 4, mgd by Dr Justin Mend   . 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([place])   . Coronary artery disease    on CT scan  . Gout   . H/O hypercholesterolemia   . Heart murmur   . History of falling    recent- see ct chest results of 04/25/2018  . Hypertension   . Hypothyroidism   . Liver cirrhosis ([place])   . Prediabetes    pt denies   . Renal artery stenosis ([place])   . Thrombocytopenia ([place])   . Wears glasses   . Wears glasses     Patient Active Problem List   Diagnosis Date Noted  . Abdominal aortic aneurysm (AAA) ([place]) 01/21/2019  . AAA (abdominal aortic aneurysm) without rupture ([place]) 01/21/2019  . Ascites 05/20/2017  . Normochromic normocytic anemia 05/20/2017  . Essential hypertension 05/20/2017  . CKD (chronic kidney disease) stage 3, GFR 30-59 ml/min 05/20/2017  . HLD (hyperlipidemia) 05/20/2017  . Hypothyroidism 05/20/2017    Past Surgical History:  Procedure Laterality Date  . ABDOMINAL AORTIC ENDOVASCULAR STENT GRAFT N/A 01/21/2019   Procedure: ABDOMINAL AORTIC ENDOVASCULAR STENT GRAFT WITH CO2;  Surgeon: Angelia Mould, MD;  Location: [place];  Service: Vascular;  Laterality: N/A;  . ABDOMINAL AORTOGRAM W/LOWER EXTREMITY Bilateral 12/14/2018   Procedure: ABDOMINAL AORTOGRAM W/LOWER EXTREMITY;  Surgeon: Angelia Mould, MD;  Location: [place] CV LAB;  Service: Cardiovascular;  Laterality: Bilateral;  . AV FISTULA PLACEMENT Left 03/12/2019   Procedure: LEFT ARTERIOVENOUS Arteriovenous FISTULA CREATION.;  Surgeon: Angelia Mould, MD;  Location: [place];  Service: Vascular;  Laterality: Left;  . CATARACT EXTRACTION W/ INTRAOCULAR LENS  IMPLANT, BILATERAL    . CHOLECYSTECTOMY    . ESOPHAGEAL BANDING N/A 08/11/2017   Procedure: ESOPHAGEAL BANDING;  Surgeon: Otis Brace, MD;  Location: [place];  Service: Gastroenterology;  Laterality: N/A;  . ESOPHAGEAL BANDING N/A 10/26/2017   Procedure: ESOPHAGEAL BANDING;  Surgeon: Otis Brace, MD;  [facility];  Service: Gastroenterology;  Laterality: N/A;  . ESOPHAGEAL BANDING N/A 12/19/2017   Procedure: ESOPHAGEAL BANDING;  Surgeon: Otis Brace, MD;  [facility];  Service: Gastroenterology;  Laterality: N/A;  . ESOPHAGOGASTRODUODENOSCOPY (EGD) WITH PROPOFOL N/A 07/05/2017   Procedure: ESOPHAGOGASTRODUODENOSCOPY (EGD) WITH PROPOFOL;  Surgeon: Otis Brace, MD;  [facility];  Service: Gastroenterology;  Laterality: N/A;  . ESOPHAGOGASTRODUODENOSCOPY (EGD) WITH PROPOFOL N/A 08/11/2017   Procedure: ESOPHAGOGASTRODUODENOSCOPY (EGD)  WITH PROPOFOL;  Surgeon: Otis Brace, MD;  [facility];  Service: Gastroenterology;  Laterality: N/A;  . ESOPHAGOGASTRODUODENOSCOPY (EGD) WITH PROPOFOL N/A 10/26/2017   Procedure: ESOPHAGOGASTRODUODENOSCOPY (EGD) WITH PROPOFOL;  Surgeon: Otis Brace, MD;  [facility];  Service: Gastroenterology;  Laterality: N/A;  . ESOPHAGOGASTRODUODENOSCOPY (EGD) WITH PROPOFOL N/A 12/19/2017   Procedure: ESOPHAGOGASTRODUODENOSCOPY (EGD) WITH PROPOFOL;   Surgeon: Otis Brace, MD;  [facility];  Service: Gastroenterology;  Laterality: N/A;  . ESOPHAGOGASTRODUODENOSCOPY (EGD) WITH PROPOFOL N/A 10/29/2018   Procedure: ESOPHAGOGASTRODUODENOSCOPY (EGD) WITH PROPOFOL;  Surgeon: Otis Brace, MD;  [facility];  Service: Gastroenterology;  Laterality: N/A;  . EYE SURGERY     bilateral cataract removal with lens placement  . HERNIA REPAIR    . IR PARACENTESIS  12/26/2018  . IR PARACENTESIS  01/02/2019  . IR PARACENTESIS  01/15/2019  . IR PARACENTESIS  01/30/2019  . IR PARACENTESIS  02/05/2019  . IR PARACENTESIS  02/14/2019  . IR PARACENTESIS  02/28/2019  . IR PARACENTESIS  03/14/2019  . IR PARACENTESIS  03/29/2019  . IR PARACENTESIS  04/11/2019  . IR PARACENTESIS  04/25/2019  . IR PARACENTESIS  05/09/2019  . IR PARACENTESIS  06/03/2019  . IR PARACENTESIS  06/06/2019  . IR PARACENTESIS  06/20/2019  . IR PARACENTESIS  07/03/2019  . IR PARACENTESIS  07/19/2019  . IR PARACENTESIS  08/08/2019  . IR PARACENTESIS  08/27/2019  . IR PARACENTESIS  09/09/2019  . PROSTATECTOMY    . subtotal gastrectomy         Family History  Problem Relation Age of Onset  . Diabetes Mellitus II Mother   . Stroke Mother     Social History   Tobacco Use  . Smoking status: Never Smoker  . Smokeless tobacco: Never Used  Vaping Use  . Vaping Use: Never used  Substance Use Topics  . Alcohol use: Not Currently  . Drug use: Never    Home Medications Prior to Admission medications   Medication Sig Start Date End Date Taking? Authorizing Provider  amLODipine (NORVASC) 5 MG tablet Take 5 mg by mouth daily. 01/08/19   [provider]  aspirin EC 81 MG EC tablet Take 1 tablet (81 mg total) by mouth daily. Patient taking differently: Take 81 mg by mouth at bedtime.  01/22/19   Dagoberto Ligas, PA-C  atorvastatin (LIPITOR) 10 MG tablet Take 10 mg by mouth at bedtime.  05/06/17   [provider]  carvedilol (COREG) 12.5 MG tablet  Take 12.5 mg by mouth 2 (two) times daily. 03/22/17   [provider]  cloNIDine (CATAPRES) 0.2 MG tablet Take 0.2 mg by mouth 2 (two) times daily.    [provider]  colchicine 0.6 MG tablet Take 0.6 mg by mouth daily as needed (gout flare).  03/22/17   [provider]  febuxostat (ULORIC) 40 MG tablet Take 40 mg by mouth daily.    [provider]  levothyroxine (SYNTHROID, LEVOTHROID) 25 MCG tablet Take 25 mcg by mouth daily before breakfast.  04/05/17   [provider]  Multiple Vitamins-Minerals (MULTIVITAMIN GUMMIES ADULT) CHEW Chew 2 capsules by mouth daily.    [provider]  pantoprazole (PROTONIX) 40 MG tablet Take 1 tablet (40 mg total) by mouth daily. 12/19/17 03/03/20  Otis Brace, MD  sodium bicarbonate 650 MG tablet Take 650 mg by mouth 2 (two) times daily.    [provider]  traMADol (ULTRAM) 50 MG tablet Take 1 tablet (50 mg total) by  mouth every 8 (eight) hours as needed for moderate pain. 03/12/19   Rhyne, Hulen Shouts, PA-C    Allergies    Cephalexin and Hydralazine  Review of Systems   Review of Systems All systems reviewed and negative, other than as noted in HPI.  Physical Exam Updated Vital Signs BP (!) 151/71   Pulse 67   Temp 97.7 F (36.5 C) (Oral)   Resp 19   SpO2 97%   Physical Exam Vitals and nursing note reviewed.  Constitutional:      General: He is not in acute distress.    Appearance: He is well-developed.  HENT:     Head: Normocephalic.     Comments: Small hematoma to the left parietal region.  Minimal localized tenderness.  No midline spinal tenderness. Eyes:     General:        Right eye: No discharge.        Left eye: No discharge.     Conjunctiva/sclera: Conjunctivae normal.  Cardiovascular:     Rate and Rhythm: Normal rate and regular rhythm.     Heart sounds: Normal heart sounds. No murmur heard.  No friction rub. No gallop.   Pulmonary:     Effort: Pulmonary effort is  normal. No respiratory distress.     Breath sounds: Normal breath sounds.  Abdominal:     General: There is distension.     Palpations: Abdomen is soft.     Tenderness: There is no abdominal tenderness.  Musculoskeletal:        General: No tenderness.     Cervical back: Neck supple.  Skin:    General: Skin is warm and dry.  Neurological:     Mental Status: He is alert and oriented to person, place, and time.     Cranial Nerves: No cranial nerve deficit.     Sensory: No sensory deficit.     Motor: No weakness.     Coordination: Coordination normal.  Psychiatric:        Behavior: Behavior normal.        Thought Content: Thought content normal.     ED Results / Procedures / Treatments   Labs (all labs ordered are listed, but only abnormal results are displayed) Labs Reviewed  BASIC METABOLIC PANEL - Abnormal; Notable for the following components:      Result Value   Potassium >7.5 (*)    Chloride 112 (*)    CO2 16 (*)    Glucose, Bld 104 (*)    BUN 79 (*)    Creatinine, Ser 6.05 (*)    Calcium 7.8 (*)    GFR calc non Af Amer 8 (*)    GFR calc Af Amer 10 (*)    All other components within normal limits  CBC - Abnormal; Notable for the following components:   RBC 3.12 (*)    Hemoglobin 9.7 (*)    HCT 32.2 (*)    MCV 103.2 (*)    Platelets 142 (*)    All other components within normal limits  SARS CORONAVIRUS 2 BY RT PCR (HOSPITAL ORDER, [place] [facility])  URINALYSIS, ROUTINE W REFLEX MICROSCOPIC  CBG MONITORING, ED    EKG EKG Interpretation  Date/Time:  Monday September 23 2019 14:27:20 EDT Ventricular Rate:  68 PR Interval:  188 QRS Duration: 142 QT Interval:  398 QTC Calculation: 423 R Axis:   -42 Text Interpretation: Normal sinus rhythm Left axis deviation Non-specific intra-ventricular conduction block Cannot rule out  Septal infarct , age undetermined Possible Lateral infarct , age undetermined Abnormal ECG Confirmed by Virgel Manifold  [phone]) on 09/23/2019 3:56:54 PM   Radiology No results found.  Procedures Procedures (including critical care time)  CRITICAL CARE Performed by: Virgel Manifold Total critical care time: 35 minutes Critical care time was exclusive of separately billable procedures and treating other patients. Critical care was necessary to treat or prevent imminent or life-threatening deterioration. Critical care was time spent personally by me on the following activities: development of treatment plan with patient and/or surrogate as well as nursing, discussions with consultants, evaluation of patient's response to treatment, examination of patient, obtaining history from patient or surrogate, ordering and performing treatments and interventions, ordering and review of laboratory studies, ordering and review of radiographic studies, pulse oximetry and re-evaluation of patient's condition.   Medications Ordered in ED Medications  sodium chloride flush (NS) 0.9 % injection 3 mL (has no administration in time range)  calcium gluconate 1 g/ 50 mL sodium chloride IVPB (has no administration in time range)  insulin aspart (novoLOG) injection 10 Units (has no administration in time range)  sodium bicarbonate injection 50 mEq (has no administration in time range)  dextrose 50 % solution 50 mL (has no administration in time range)  sodium zirconium cyclosilicate (LOKELMA) packet 5 g (has no administration in time range)    ED Course  I have reviewed the triage vital signs and the nursing notes.  Pertinent labs & imaging results that were available during my care of the patient were reviewed by me and considered in my medical decision making (see chart for details).    MDM Rules/Calculators/A&P                          79 year old male presenting after fall.  Did strike his head but no LOC, only mild headache, no acute neurologic complaints and he is not anticoagulated.  Will obtain a CT scan of his head  although I have a very low suspicion for fracture or significant intracranial injury.  I am concerned though about his hyperkalemia.  Worsening renal function on top of baseline severe chronic kidney disease.  Potassium >7.5 with conduction abnormalities on EKG. QRS duration of 142 ms from 84 ms on prior EKG 11/2018. Will medically treat. Missed paracentesis today. He is distended but soft. Denies any abdominal pain and is in no respiratory distress. Can be deferred until more pressing issues are addressed.    Final Clinical Impression(s) / ED Diagnoses Final diagnoses:  Hyperkalemia  Acute on chronic renal insufficiency  Hematoma of scalp, initial encounter  Other ascites    Rx / DC Orders ED Discharge Orders    None       Virgel Manifold, MD 09/23/19 1646

## 2019-09-23 NOTE — Consult Note (Addendum)
Ricky Harris Renal Consultation Note  Requesting MD: Dorris Singh MD  Indication for Consultation:  Hyperkalemia   Chief complaint: passed out  HPI:  Ricky Harris is a 79 y.o. 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  Stood up and fell down; he did drive himself.  Nausea in the AM's and poor appetite last several days.  Denies any feeling of dizziness to me.  Breathing is ok.  Abd feels distended.  He had a left brachiocephalic AVF placed in 05/2593.  Per Dr. Nicole Cella note his AVF could be used for HD in 05/2019 if needed.  Note he has had multiple paracenteses with IR - 7.5 liters on 7/12 and prior to that 7.5 liters on 08/27/19.  I was called by family medicine resident at 7:35 pm to notify me of the consult with K of 7.2.  The patient presented with potassium greater than 7.5, creatinine 6.05, and BUN 79 and bicarb 16.  Covid screen negative.  In the ER, he received insulin 10 units, 1 amp D50, 1 amp bicarb, lokelma 5 gram, calcium gluconate and NS at 50 ml/hr.  Spoke with team and recommended additional 1 amp of D50 and monitoring for hypoglycemia as most recent glu 62.  Also recommended bicarb 1 amp and lokelma 10 gram. He is followed by Dr. Justin Mend at [facility].  Last seen in the office 11/2018.  At that time 1 month follow-up was planned.  Was later cancelled as he was to have vascular surgery (abd aortic vascular stent) which was anticipated to likely require initiation of hemodialysis.  He's been getting 150 mcg aranesp every 2 weeks per charting.  We discussed risks, benefits, and indications for dialysis and he does consent to dialysis.  He had CT a/p today with large volume ascites and subcutaneous edema involving the left lower anterior abdominal wall; also with bilateral low density renal lesions and subcm nodule in the left kidney too small to characterize  and stable from 03/2019 exam.    Creatinine, Ser  Date/Time Value Ref Range Status  09/23/2019 06:13 PM 6.00 (H) 0.61 - 1.24 mg/dL Final  09/23/2019 02:33 PM 6.05 (H) 0.61 - 1.24 mg/dL Final  03/12/2019 07:08 AM 4.20 (H) 0.61 - 1.24 mg/dL Final  01/22/2019 04:40 AM 4.02 (H) 0.61 - 1.24 mg/dL Final  01/21/2019 10:31 AM 3.68 (H) 0.61 - 1.24 mg/dL Final  01/18/2019 12:25 PM 3.96 (H) 0.61 - 1.24 mg/dL Final  12/27/2018 12:00 PM 4.00 (H) 0.61 - 1.24 mg/dL Final  12/14/2018 08:00 AM 3.70 (H) 0.61 - 1.24 mg/dL Final  09/11/2018 10:30 AM 3.15 (H) 0.61 - 1.24 mg/dL Final  07/05/2017 09:28 AM 1.80 (H) 0.61 - 1.24 mg/dL Final  05/21/2017 05:37 AM 2.06 (H) 0.61 - 1.24 mg/dL Final  05/20/2017 05:57 AM 1.95 (H) 0.61 - 1.24 mg/dL Final  05/20/2017 12:51 AM 1.99 (H) 0.61 - 1.24 mg/dL Final     PMHx:   Past Medical History:  Diagnosis Date  . AAA (abdominal aortic aneurysm) ([place])    5.3cm , magd by vascular Dr Scot Dock ,  . Anemia    " slightly"  . Aortic stenosis   . Arthritis    knees  . Ascites   . Cancer [facility])    prostate  . Chronic kidney disease    stage 4, mgd by Dr Justin Mend   .  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([place])   . Coronary artery disease    on CT scan  . Gout   . H/O hypercholesterolemia   . Heart murmur   . History of falling    recent- see ct chest results of 04/25/2018  . Hypertension   . Hypothyroidism   . Liver cirrhosis ([place])   . Prediabetes    pt denies   . Renal artery stenosis ([place])   . Thrombocytopenia ([place])   . Wears glasses   . Wears glasses     Past Surgical History:  Procedure Laterality Date  . ABDOMINAL AORTIC ENDOVASCULAR STENT GRAFT N/A 01/21/2019   Procedure: ABDOMINAL AORTIC ENDOVASCULAR STENT GRAFT WITH CO2;  Surgeon: Angelia Mould, MD;  Location: [place];  Service: Vascular;  Laterality: N/A;  . ABDOMINAL AORTOGRAM W/LOWER EXTREMITY Bilateral 12/14/2018   Procedure: ABDOMINAL AORTOGRAM W/LOWER EXTREMITY;  Surgeon: Angelia Mould, MD;  Location: [place] CV LAB;  Service: Cardiovascular;  Laterality: Bilateral;  . AV FISTULA PLACEMENT Left 03/12/2019   Procedure: LEFT ARTERIOVENOUS Arteriovenous FISTULA CREATION.;  Surgeon: Angelia Mould, MD;  Location: [place];  Service: Vascular;  Laterality: Left;  . CATARACT EXTRACTION W/ INTRAOCULAR LENS  IMPLANT, BILATERAL    . CHOLECYSTECTOMY    . ESOPHAGEAL BANDING N/A 08/11/2017   Procedure: ESOPHAGEAL BANDING;  Surgeon: Otis Brace, MD;  Location: [place];  Service: Gastroenterology;  Laterality: N/A;  . ESOPHAGEAL BANDING N/A 10/26/2017   Procedure: ESOPHAGEAL BANDING;  Surgeon: Otis Brace, MD;  [facility];  Service: Gastroenterology;  Laterality: N/A;  . ESOPHAGEAL BANDING N/A 12/19/2017   Procedure: ESOPHAGEAL BANDING;  Surgeon: Otis Brace, MD;  [facility];  Service: Gastroenterology;  Laterality: N/A;  . ESOPHAGOGASTRODUODENOSCOPY (EGD) WITH PROPOFOL N/A 07/05/2017   Procedure: ESOPHAGOGASTRODUODENOSCOPY (EGD) WITH PROPOFOL;  Surgeon: Otis Brace, MD;  [facility];  Service: Gastroenterology;  Laterality: N/A;  . ESOPHAGOGASTRODUODENOSCOPY (EGD) WITH PROPOFOL N/A 08/11/2017   Procedure: ESOPHAGOGASTRODUODENOSCOPY (EGD) WITH PROPOFOL;  Surgeon: Otis Brace, MD;  [facility];  Service: Gastroenterology;  Laterality: N/A;  . ESOPHAGOGASTRODUODENOSCOPY (EGD) WITH PROPOFOL N/A 10/26/2017   Procedure: ESOPHAGOGASTRODUODENOSCOPY (EGD) WITH PROPOFOL;  Surgeon: Otis Brace, MD;  [facility];  Service: Gastroenterology;  Laterality: N/A;  . ESOPHAGOGASTRODUODENOSCOPY (EGD) WITH PROPOFOL N/A 12/19/2017   Procedure: ESOPHAGOGASTRODUODENOSCOPY (EGD) WITH PROPOFOL;  Surgeon: Otis Brace, MD;  [facility];  Service: Gastroenterology;  Laterality: N/A;  . ESOPHAGOGASTRODUODENOSCOPY (EGD) WITH PROPOFOL N/A 10/29/2018   Procedure: ESOPHAGOGASTRODUODENOSCOPY  (EGD) WITH PROPOFOL;  Surgeon: Otis Brace, MD;  [facility];  Service: Gastroenterology;  Laterality: N/A;  . EYE SURGERY     bilateral cataract removal with lens placement  . HERNIA REPAIR    . IR PARACENTESIS  12/26/2018  . IR PARACENTESIS  01/02/2019  . IR PARACENTESIS  01/15/2019  . IR PARACENTESIS  01/30/2019  . IR PARACENTESIS  02/05/2019  . IR PARACENTESIS  02/14/2019  . IR PARACENTESIS  02/28/2019  . IR PARACENTESIS  03/14/2019  . IR PARACENTESIS  03/29/2019  . IR PARACENTESIS  04/11/2019  . IR PARACENTESIS  04/25/2019  . IR PARACENTESIS  05/09/2019  . IR PARACENTESIS  06/03/2019  . IR PARACENTESIS  06/06/2019  . IR PARACENTESIS  06/20/2019  . IR PARACENTESIS  07/03/2019  . IR PARACENTESIS  07/19/2019  . IR PARACENTESIS  08/08/2019  . IR PARACENTESIS  08/27/2019  . IR PARACENTESIS  09/09/2019  . PROSTATECTOMY    . subtotal gastrectomy  Family Hx:  Family History  Problem Relation Age of Onset  . Diabetes Mellitus II Mother   . Stroke Mother     Social History:  reports that he has never smoked. He has never used smokeless tobacco. He reports previous alcohol use. He reports that he does not use drugs.  Allergies:  Allergies  Allergen Reactions  . Cephalexin Hives  . Hydralazine Hives    Medications: Prior to Admission medications   Medication Sig Start Date End Date Taking? Authorizing Provider  amLODipine (NORVASC) 5 MG tablet Take 5 mg by mouth daily. 01/08/19  Yes [provider]  aspirin EC 81 MG EC tablet Take 1 tablet (81 mg total) by mouth daily. Patient taking differently: Take 81 mg by mouth at bedtime.  01/22/19  Yes Dagoberto Ligas, PA-C  atorvastatin (LIPITOR) 10 MG tablet Take 10 mg by mouth at bedtime.  05/06/17  Yes [provider]  carvedilol (COREG) 12.5 MG tablet Take 12.5 mg by mouth 2 (two) times daily. 03/22/17  Yes [provider]  cloNIDine (CATAPRES) 0.2 MG tablet Take 0.2 mg by mouth 2 (two) times daily.    Yes [provider]  colchicine 0.6 MG tablet Take 0.6 mg by mouth daily as needed (gout flare).  03/22/17   [provider]  febuxostat (ULORIC) 40 MG tablet Take 40 mg by mouth daily.    [provider]  furosemide (LASIX) 20 MG tablet Take 20 mg by mouth daily. 05/01/19   [provider]  hydrOXYzine (ATARAX/VISTARIL) 10 MG tablet Take 10 mg by mouth 3 (three) times daily. 09/03/19   [provider]  levothyroxine (SYNTHROID) 50 MCG tablet Take 50 mcg by mouth daily. 07/20/19   [provider]  Multiple Vitamins-Minerals (MULTIVITAMIN GUMMIES ADULT) CHEW Chew 2 capsules by mouth daily.    [provider]  pantoprazole (PROTONIX) 40 MG tablet Take 1 tablet (40 mg total) by mouth daily. 12/19/17 03/03/20  Otis Brace, MD  sodium bicarbonate 650 MG tablet Take 650 mg by mouth 2 (two) times daily.    [provider]  traMADol (ULTRAM) 50 MG tablet Take 1 tablet (50 mg total) by mouth every 8 (eight) hours as needed for moderate pain. 03/12/19   Rhyne, Hulen Shouts, PA-C    I have reviewed the patient's current and reported prior to admission medications.  Labs:  BMP Latest Ref Rng & Units 09/23/2019 09/23/2019 03/12/2019  Glucose 70 - 99 mg/dL 62(L) 104(H) 81  BUN 8 - 23 mg/dL 78(H) 79(H) 59(H)  Creatinine 0.61 - 1.24 mg/dL 6.00(H) 6.05(H) 4.20(H)  Sodium 135 - 145 mmol/L 137 135 140  Potassium 3.5 - 5.1 mmol/L 7.2(HH) >7.5(HH) 5.6(H)  Chloride 98 - 111 mmol/L 112(H) 112(H) 118(H)  CO2 22 - 32 mmol/L 15(L) 16(L) -  Calcium 8.9 - 10.3 mg/dL 7.7(L) 7.8(L) -    ROS:  Pertinent items noted in HPI and remainder of comprehensive ROS otherwise negative.  Physical Exam: Vitals:   09/23/19 1715 09/23/19 1830  BP: (!) 146/61 (!) 150/64  Pulse: 63 58  Resp: (!) 25 19  Temp:    SpO2: 100% 99%     General: elderly male in stretcher in NAD at rest.  BP 140/53 and HR 59 on my exam HEENT: NCAT Eyes: EOMI PERRL Neck: supple  trachea midline Heart: S1S2 no rub Lungs: clear but reduced; unlabored at rest on room air  Abdomen: distended and somewhat tender with ecchymoses as below. 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  Ordered HD for 7/27 as well   # Syncope  - Possibly in setting of arrhythmia given the hyperkalemia - team is getting TTE  # HTN  - controlled on current regimen    # Hypoglycemia - s/p additional D50 - ER RN checking point of care - Please continue to monitor blood glucose as he received a high dose of insulin relative to his GFR while normoglycemic   # Metabolic acidosis  - Starting HD  - Stop bicarb once on HD   # Cirrhosis - hepatic - paracentesis per primary team discretion   # Anemia of CKD  - No acute indication for ESA - currently receives through [facility] on chart review and will need to clarify last dose  Claudia Desanctis 09/23/2019, 9:05 PM

## 2019-09-23 NOTE — H&P (Addendum)
[facility] Admission History and Physical Service Pager: [phone]  Patient name: Ricky Harris Medical record number: [id number] Date of birth: [birth year] Age: 79 y.o. Gender: male  Primary Care Provider: [name]., MD Consultants: Nephrology, GI Code Status: FULL Preferred Emergency Contact: Marshell Levan [phone], son   Chief Complaint: dizziness, fall   Assessment and Plan: Ricky Harris is a 79 y.o. male presenting for fall today. PMH is significant for AAA, cirrhosis, hypertension, CKD stage III, hyperlipidemia, hypothyroidism and normocytic anemia.  Hyperkalemia  Potassium 7.5 on admission with history of newly diagnosed ESRD and has not started dialysis.  EKG peaked T waves most notable in V2 and V3 with sinus bradycardia. Patient given 5 mg Lokelma, 10 units of short acting insulin (with D50) and calcium gluconate in the ED. There is concern for hepatorenal syndrome in the setting for worsening CKD. Patient has a diagnosis of non-alcoholic cirrhosis w/ recurrent ascites, with frequent therapeutic paracenteses. No hx of nephrotoxic home meds or recent NSAID use. Patient may need emergent dialysis if not improving. STAT BMP pending. Given that pitting edema noted on physical exam and history of cirrhosis, volume status could have contributed. BNP pending. Patient on beta blocker which could also lead to his hyperkalemic state. No hemolysis indicated.  No likely due to rhabdomyolysis though patient had a fall today. Will reassess patient's dietary intake for potassium rich foods.  -Admit to med tele, attending Dr. Owens Shark -Consult nephrology, appreciate recommendations  -s/p 5mg  Lokelma, insulin 10u with D50, calcium gluconate   -repeat STAT BMP -following BMP q4h -Consider called consulting GI in AM c/f hepatorenal  -Renal diet with fluid restriction  -SCDs  -vitals per unit routine  -AM EKG -PT/OT eval and treat.    Fall  Syncope  On the way  to paracentesis, patient fell he does not recall losing consciousness but cannot remember events after fall.  On physical exam patient has small wound with dried blood on the left parietal area.  Ecchymosis noted on the left lower abdomen and pain to palpation in the lower abdomen.  Neuro exam unremarkable.  CBG within normal limits. Can not rule out cardiac cause of syncope. Will obtain risk stratification labs and ECHO. Possibly vasovagal syncope.  Awaiting BNP, but likely not CHF given patient's history.  -awaiting CT head -awaiting CT abdomen/pelvis -Hold DVT prophylaxis, pending CT  -SCDs -hold HTN meds -Obtain ECHO -F/u lipid panel, a1c -Follow up BNP  Non-Alcoholic Cirrhosis  Receives therapeutic paracentesis every 2 weeks per chart review.  Patient missed paracentesis scheduled for today due to fall upon arrival.  Last paracentesis 09/09/18/2021 removed 7.5 L. Home med includes sodium bicarbonate. Will obtain LFTs in AM to better assess MELD score. 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 Plt on admission 142. INR and PTT in AM.  Patient could benefit from palliative care discussion.  -Consult GI in AM -Monitor I/O -Daily Weights  -Strict Is/Os -Consider IR paracentesis once stable -Consider palliative care consult  -Follow up LFTs  -SCDs, consider Heparin pending imaging  -AM CBC  -Follow up INR, PTT   ESRD On admission Cr 6.05, BUN 79, bicarb 16 and GFR 8. Pt has not yet started dialysis. Patient has LUE fistula. No schedule set for dialysis.  -Consider nephrology consult in AM or if electrolytes not improving  -Monitor with BMP -Consider HD outpatient  Hypertension Home meds include amlodipine and Coreg.  BP on admission 151/71. -Hold home meds, restart in AM   Hypothyroidism  Nontender thyroid on exam.  Denies any associated symptoms at this time.  Home meds include Synthroid 60mcg -Continue home meds  Macrocytic anemia Hemoglobin on admission 9.7 compared to  in June 10.7. MCV 103.2 on admission.  -Continue to monitor with CBC -Consider transfusion if lower than 7 -Follow anemia panel  AAA S/p vascular repair on 01/21/2019 using 3 components. 08/2018 ultrasound showed no acute findings with evidence of cirrhosis with mild ascites. -Follows with vascular and vein specialist, Dr. Scot Dock  Hyperlipidemia  No recent lipid panel available. Home meds include atorvastatin 10 mg. -Lipitor 10 mg -AM Lipid panel    FEN/GI: Renal diet, replete electrolytes as needed  Prophylaxis: SCDs  Disposition: Admitted to progressive    History of Present Illness:  Ricky Harris is a 79 y.o. male presenting for a fall after getting out of his car for a scheduled paracentesis today.  He fell on the concrete and hit his head.  States that he does not remember the events leading up to his fall, but does not think he lost consciousness.  Endorses a headache.  Reports nausea in the mornings and diarrhea over the past week for which he took Pepto-Bismol.  Denies chest pain, dyspnea, vision changes, vomiting, fever and chills.  Reports not having any recent falls, ambulates at baseline without assistance or limitation.    Review Of Systems: Per HPI with the following additions:   Review of Systems  Constitutional: Negative for fever.  HENT: Negative for congestion.   Eyes: Negative for visual disturbance.  Respiratory: Negative for cough and shortness of breath.   Cardiovascular: Negative for chest pain.  Gastrointestinal: Positive for diarrhea and nausea. Negative for abdominal pain and vomiting.  Genitourinary: Negative for dysuria.  Musculoskeletal: Negative for back pain.  Skin: Positive for wound.  Neurological: Positive for dizziness, syncope and headaches.       Fall today     Patient Active Problem List   Diagnosis Date Noted  . Abdominal aortic aneurysm (AAA) ([place]) 01/21/2019  . AAA (abdominal aortic aneurysm) without rupture ([place]) 01/21/2019  .  Ascites 05/20/2017  . Normochromic normocytic anemia 05/20/2017  . Essential hypertension 05/20/2017  . CKD (chronic kidney disease) stage 3, GFR 30-59 ml/min 05/20/2017  . HLD (hyperlipidemia) 05/20/2017  . Hypothyroidism 05/20/2017    Past Medical History: Past Medical History:  Diagnosis Date  . AAA (abdominal aortic aneurysm) ([address])    5.3cm , magd by vascular Dr Scot Dock ,  . Anemia    " slightly"  . Aortic stenosis   . Arthritis    knees  . Ascites   . Cancer [facility])    prostate  . Chronic kidney disease    stage 4, mgd by Dr Justin Mend   . 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([place])   . Coronary artery disease    on CT scan  . Gout   . H/O hypercholesterolemia   . Heart murmur   . History of falling    recent- see ct chest results of 04/25/2018  . Hypertension   . Hypothyroidism   . Liver cirrhosis ([place])   . Prediabetes    pt denies   . Renal artery stenosis ([place])   . Thrombocytopenia ([place])   . Wears glasses   . Wears glasses     Past Surgical History: Past Surgical History:  Procedure Laterality Date  . ABDOMINAL AORTIC ENDOVASCULAR STENT GRAFT N/A 01/21/2019   Procedure: ABDOMINAL AORTIC ENDOVASCULAR STENT GRAFT WITH CO2;  Surgeon:  Angelia Mould, MD;  Location: [facility];  Service: Vascular;  Laterality: N/A;  . ABDOMINAL AORTOGRAM W/LOWER EXTREMITY Bilateral 12/14/2018   Procedure: ABDOMINAL AORTOGRAM W/LOWER EXTREMITY;  Surgeon: Angelia Mould, MD;  Location: [facility];  Service: Cardiovascular;  Laterality: Bilateral;  . AV FISTULA PLACEMENT Left 03/12/2019   Procedure: LEFT ARTERIOVENOUS Arteriovenous FISTULA CREATION.;  Surgeon: Angelia Mould, MD;  Location: [facility];  Service: Vascular;  Laterality: Left;  . CATARACT EXTRACTION W/ INTRAOCULAR LENS  IMPLANT, BILATERAL    . CHOLECYSTECTOMY    . ESOPHAGEAL BANDING N/A 08/11/2017   Procedure: ESOPHAGEAL BANDING;  Surgeon: Otis Brace, MD;  Location: [facility];  Service:  Gastroenterology;  Laterality: N/A;  . ESOPHAGEAL BANDING N/A 10/26/2017   Procedure: ESOPHAGEAL BANDING;  Surgeon: Otis Brace, MD;  Location: [facility];  Service: Gastroenterology;  Laterality: N/A;  . ESOPHAGEAL BANDING N/A 12/19/2017   Procedure: ESOPHAGEAL BANDING;  Surgeon: Otis Brace, MD;  Location: [facility];  Service: Gastroenterology;  Laterality: N/A;  . ESOPHAGOGASTRODUODENOSCOPY (EGD) WITH PROPOFOL N/A 07/05/2017   Procedure: ESOPHAGOGASTRODUODENOSCOPY (EGD) WITH PROPOFOL;  Surgeon: Otis Brace, MD;  [facility];  Service: Gastroenterology;  Laterality: N/A;  . ESOPHAGOGASTRODUODENOSCOPY (EGD) WITH PROPOFOL N/A 08/11/2017   Procedure: ESOPHAGOGASTRODUODENOSCOPY (EGD) WITH PROPOFOL;  Surgeon: Otis Brace, MD;  Location: [facility];  Service: Gastroenterology;  Laterality: N/A;  . ESOPHAGOGASTRODUODENOSCOPY (EGD) WITH PROPOFOL N/A 10/26/2017   Procedure: ESOPHAGOGASTRODUODENOSCOPY (EGD) WITH PROPOFOL;  Surgeon: Otis Brace, MD;  Location: [facility];  Service: Gastroenterology;  Laterality: N/A;  . ESOPHAGOGASTRODUODENOSCOPY (EGD) WITH PROPOFOL N/A 12/19/2017   Procedure: ESOPHAGOGASTRODUODENOSCOPY (EGD) WITH PROPOFOL;  Surgeon: Otis Brace, MD;  [facility]: [facility];  Service: Gastroenterology;  Laterality: N/A;  . ESOPHAGOGASTRODUODENOSCOPY (EGD) WITH PROPOFOL N/A 10/29/2018   Procedure: ESOPHAGOGASTRODUODENOSCOPY (EGD) WITH PROPOFOL;  Surgeon: Otis Brace, MD;  Location: [facility];  Service: Gastroenterology;  Laterality: N/A;  . EYE SURGERY     bilateral cataract removal with lens placement  . HERNIA REPAIR    . IR PARACENTESIS  12/26/2018  . IR PARACENTESIS  01/02/2019  . IR PARACENTESIS  01/15/2019  . IR PARACENTESIS  01/30/2019  . IR PARACENTESIS  02/05/2019  . IR PARACENTESIS  02/14/2019  . IR PARACENTESIS  02/28/2019  . IR PARACENTESIS  03/14/2019  . IR PARACENTESIS  03/29/2019  . IR PARACENTESIS  04/11/2019  .  IR PARACENTESIS  04/25/2019  . IR PARACENTESIS  05/09/2019  . IR PARACENTESIS  06/03/2019  . IR PARACENTESIS  06/06/2019  . IR PARACENTESIS  06/20/2019  . IR PARACENTESIS  07/03/2019  . IR PARACENTESIS  07/19/2019  . IR PARACENTESIS  08/08/2019  . IR PARACENTESIS  08/27/2019  . IR PARACENTESIS  09/09/2019  . PROSTATECTOMY    . subtotal gastrectomy      Social History: Social History   Tobacco Use  . Smoking status: Never Smoker  . Smokeless tobacco: Never Used  Vaping Use  . Vaping Use: Never used  Substance Use Topics  . Alcohol use: Not Currently  . Drug use: Never    Please also refer to relevant sections of EMR.  Family History: Family History  Problem Relation Age of Onset  . Diabetes Mellitus II Mother   . Stroke Mother      Allergies and Medications: Allergies  Allergen Reactions  . Cephalexin Hives  . Hydralazine Hives   No current facility-administered medications on file prior to encounter.   Current Outpatient Medications on File Prior to Encounter  Medication Sig  Dispense Refill  . amLODipine (NORVASC) 5 MG tablet Take 5 mg by mouth daily.    Marland Kitchen aspirin EC 81 MG EC tablet Take 1 tablet (81 mg total) by mouth daily. (Patient taking differently: Take 81 mg by mouth at bedtime. )    . atorvastatin (LIPITOR) 10 MG tablet Take 10 mg by mouth at bedtime.     . carvedilol (COREG) 12.5 MG tablet Take 12.5 mg by mouth 2 (two) times daily.    . cloNIDine (CATAPRES) 0.2 MG tablet Take 0.2 mg by mouth 2 (two) times daily.    . colchicine 0.6 MG tablet Take 0.6 mg by mouth daily as needed (gout flare).     . febuxostat (ULORIC) 40 MG tablet Take 40 mg by mouth daily.    Marland Kitchen levothyroxine (SYNTHROID, LEVOTHROID) 25 MCG tablet Take 25 mcg by mouth daily before breakfast.     . Multiple Vitamins-Minerals (MULTIVITAMIN GUMMIES ADULT) CHEW Chew 2 capsules by mouth daily.    . pantoprazole (PROTONIX) 40 MG tablet Take 1 tablet (40 mg total) by mouth daily. 90 tablet 3  . sodium  bicarbonate 650 MG tablet Take 650 mg by mouth 2 (two) times daily.    . traMADol (ULTRAM) 50 MG tablet Take 1 tablet (50 mg total) by mouth every 8 (eight) hours as needed for moderate pain. 8 tablet 0    Objective: BP (!) 146/61   Pulse 63   Temp 97.7 F (36.5 C) (Oral)   Resp (!) 25   Ht 6' (1.829 m)   Wt 73 kg   SpO2 100%   BMI 21.83 kg/m  Exam: General: Resting comfortably in bed, in no acute distress Eyes: PERRLA ENTM: Moist membranes Head: Small wound (dried blood) of the left parietal area- photo in media.  Neck: No JVD, nontender thyroid Cardiovascular: Bradycardic regular rthym, with murmur detected around aortic valve, fistula LUE  Respiratory: Clear and eqaul to auscl. No extra work of breathing noted Gastrointestinal: Abdominal ecchymosis of the lower left side of the abdomen photo below. Tenderness to palpation of the lower right and left quadrants. Distended abdomen with fluid wave present MSK: 5/5 BLE 5/5 bil dorsi and plantarflexion, 5/5 BUE and bil grip Derm: Bruises of the abdomen and head noted in the media of the chart Neuro: AOx4, CN 2-12 grossly intact, sensation intake, finger to nose testing normal,  Psych: Appropriate mood, no agitation noted Extremities: Lower Left leg 2+ knee pitting edema, right trace-1+ LE pitting edema, distal pulses intact bilaterally         Labs and Imaging: CBC BMET  Recent Labs  Lab 09/23/19 1433  WBC 5.8  HGB 9.7*  HCT 32.2*  PLT 142*   Recent Labs  Lab 09/23/19 1433  NA 135  K >7.5*  CL 112*  CO2 16*  BUN 79*  CREATININE 6.05*  GLUCOSE 104*  CALCIUM 7.8*     EKG: NSR w/ slight peaked T waves in V1 and V3    Guadalupe, Anupa, DO 09/23/2019, 5:25 PM PGY-1, [facility] pager: [phone], text pages welcome    FPTS Upper-Level Resident Addendum   I have independently interviewed and examined the patient. I have discussed the above with the original author and agree with  their documentation. My edits for correction/addition/clarification are in blue Please see also any attending notes.   Lyndee Hensen, DO PGY-2, [facility] 09/23/2019 9:17 PM

## 2019-09-23 NOTE — Progress Notes (Signed)
   FMTS Attending Admission Note: Ricky Singh, MD  Personal pager:  [phone] [facility] Pager:  [phone]  Patient seen at 1730. Will sign resident note as it is available.  79 year old with CKD V not on dialysis, cirrhosis with ascites,  aortic sclerosis , AAA s/p repair, CAD, gout, and HTN presenting with acute on chronic kidney injury, hyperkalemia, and syncopal event.   Patient reports he was getting out of his car in [place] preparing for paracentesis, , stood up, and immediately fell down. Uncertain if he had LOC or hit head. Endorses prodrome of orthostasis. Has been nauseated with poor appetite last 3-4 days. He reports this is typical pre-paracenteses symptoms. Denies vomiting, melena, hematochezia, palpitations, chest pain. He slight worsening to LE edema, chronic he reports. Denies OTC medication use, non-adherence to medications. He reports he follows with [facility] for cirrhosis. He is uncertain the cause of his cirrhosis---reports he thinks related to OTC medication in past. Complications include history of variceal bleeding requiring band and ascites requiring q2w paracentesis. Previously evaluated by [facility].  He has a history of CKD IV, progressed to CKD V most recently. Has L brachiocephalic AV fistula placed in January.  He reports compliance with medications including antihypertensives.  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 Pupils equal, round Neck: Supple, JVP 10 cm of water  Cardiac: Regular rate and rhythm. Normal S1/S2. Mid peaking systolic murmur at RUSB and separate holosystolic murmur at LLSB  Lungs: Clear bilaterally to ascultation.  Abdomen: + fluid wave, distended, tender in LLQ with ecchymoses  Extremities:  1+ edema on left none on right  Skin: No skin ulcers on LE  Psych:  Pleasant and appropriate   EKG- sinus with incomplete left bundle and lateral/anterior T wave changes, QT longer than prio Labs- notable for hyperkalemia, non-gapped acidosis, AKI on CKD V, macrocytic anemia and thrombocytopenia   CT of head, abdomen, pelvis reviewed.  1. Hyperkalemia, likely due to progression of kidney disease and acute kidney injury in setting of chronic kidney disease stage V. Repeat EKG, calcium gluconate sodium zirconium cyclosilicate H7D, insulin given X1. Consultation to Nephrology, appreciate care and recommendations.  2. Acute kidney injury in setting of CKD V, likely progression multifactorial, query if due to hepatorenal syndrome. Strict I/O, bladder scans, avoid nephrotoxic agents, urine electrolytes, monitor fluid status very closely.  3. Syncope, question if due to orthostasis, cardiac arrhythmia given potassium, or progression of valve disease. Echocardiogram, admit to progressive. CT of head due to trauma to head (small area of edema and LOC in setting of thrombocytopenia).  4. Cryptogenic cirrhosis, per notes unclear cause, will discuss LVP with Gastroenterology as this could represent hepatorenal syndrome. MELD Na is 22 5. Goals of care---discussed the rising potassium and symptoms (nausea, pruritis in past) may be indications for dialysis if not corrected in short term. Patient would still like to proceed with dialysis, fistula appears mature. Would recommend ongoing goals of care discussion given severity of liver disease.

## 2019-09-23 NOTE — ED Notes (Signed)
Pt is in dialysis, lab called to report pt critical potassium level. RN paged Dr. Royce Macadamia to notify.

## 2019-09-23 NOTE — Progress Notes (Signed)
FPTS Interim Progress Note  Repeat K 7.2, Bicarb 15, BUN 78, Cr 6.00.  Paged on-call nephrologist, Dr. Royce Macadamia.  She plans to see patient.  Advised to order Lokelma 10g x1, amp D50, and amp bicarb.  Orders placed.  [place], DO 09/23/2019, 7:39 PM PGY-3, [place] [facility] pager [phone]

## 2019-09-24 ENCOUNTER — Inpatient Hospital Stay (HOSPITAL_COMMUNITY): Payer: Medicare Other

## 2019-09-24 DIAGNOSIS — I350 Nonrheumatic aortic (valve) stenosis: Secondary | ICD-10-CM

## 2019-09-24 DIAGNOSIS — K7469 Other cirrhosis of liver: Secondary | ICD-10-CM

## 2019-09-24 DIAGNOSIS — R188 Other ascites: Secondary | ICD-10-CM | POA: Diagnosis not present

## 2019-09-24 DIAGNOSIS — E875 Hyperkalemia: Secondary | ICD-10-CM | POA: Diagnosis not present

## 2019-09-24 DIAGNOSIS — N289 Disorder of kidney and ureter, unspecified: Secondary | ICD-10-CM | POA: Diagnosis not present

## 2019-09-24 LAB — BASIC METABOLIC PANEL
Anion gap: 11 (ref 5–15)
Anion gap: 9 (ref 5–15)
Anion gap: 9 (ref 5–15)
BUN: 42 mg/dL — ABNORMAL HIGH (ref 8–23)
BUN: 43 mg/dL — ABNORMAL HIGH (ref 8–23)
BUN: 45 mg/dL — ABNORMAL HIGH (ref 8–23)
CO2: 21 mmol/L — ABNORMAL LOW (ref 22–32)
CO2: 22 mmol/L (ref 22–32)
CO2: 21 mmol/L — ABNORMAL LOW (ref 22–32)
Calcium: 7.9 mg/dL — ABNORMAL LOW (ref 8.9–10.3)
Calcium: 7.6 mg/dL — ABNORMAL LOW (ref 8.9–10.3)
Calcium: 7.4 mg/dL — ABNORMAL LOW (ref 8.9–10.3)
Chloride: 107 mmol/L (ref 98–111)
Chloride: 106 mmol/L (ref 98–111)
Chloride: 107 mmol/L (ref 98–111)
Creatinine, Ser: 3.78 mg/dL — ABNORMAL HIGH (ref 0.61–1.24)
Creatinine, Ser: 4.14 mg/dL — ABNORMAL HIGH (ref 0.61–1.24)
Creatinine, Ser: 4.17 mg/dL — ABNORMAL HIGH (ref 0.61–1.24)
GFR calc Af Amer: 17 mL/min — ABNORMAL LOW (ref >60)
GFR calc Af Amer: 15 mL/min — ABNORMAL LOW (ref >60)
GFR calc Af Amer: 15 mL/min — ABNORMAL LOW (ref >60)
GFR calc non Af Amer: 14 mL/min — ABNORMAL LOW (ref >60)
GFR calc non Af Amer: 13 mL/min — ABNORMAL LOW (ref >60)
GFR calc non Af Amer: 13 mL/min — ABNORMAL LOW (ref >60)
Glucose, Bld: 86 mg/dL (ref 70–99)
Glucose, Bld: 84 mg/dL (ref 70–99)
Glucose, Bld: 98 mg/dL (ref 70–99)
Potassium: 5.2 mmol/L — ABNORMAL HIGH (ref 3.5–5.1)
Potassium: 5.3 mmol/L — ABNORMAL HIGH (ref 3.5–5.1)
Potassium: 5.5 mmol/L — ABNORMAL HIGH (ref 3.5–5.1)
Sodium: 139 mmol/L (ref 135–145)
Sodium: 137 mmol/L (ref 135–145)
Sodium: 137 mmol/L (ref 135–145)

## 2019-09-24 LAB — HEPATIC FUNCTION PANEL
ALT: 11 U/L (ref 0–44)
AST: 14 U/L — ABNORMAL LOW (ref 15–41)
Albumin: 2.4 g/dL — ABNORMAL LOW (ref 3.5–5.0)
Alkaline Phosphatase: 95 U/L (ref 38–126)
Bilirubin, Direct: 0.4 mg/dL — ABNORMAL HIGH (ref 0.0–0.2)
Indirect Bilirubin: 0.6 mg/dL (ref 0.3–0.9)
Total Bilirubin: 1 mg/dL (ref 0.3–1.2)
Total Protein: 4.8 g/dL — ABNORMAL LOW (ref 6.5–8.1)

## 2019-09-24 LAB — RETICULOCYTES
Immature Retic Fract: 8.8 % (ref 2.3–15.9)
RBC.: 3.09 MIL/uL — ABNORMAL LOW (ref 4.22–5.81)
Retic Count, Absolute: 33.4 10*3/uL (ref 19.0–186.0)
Retic Ct Pct: 1.1 % (ref 0.4–3.1)

## 2019-09-24 LAB — APTT: aPTT: 28 seconds (ref 24–36)

## 2019-09-24 LAB — CBC
HCT: 29.7 % — ABNORMAL LOW (ref 39.0–52.0)
Hemoglobin: 9.4 g/dL — ABNORMAL LOW (ref 13.0–17.0)
MCH: 31.9 pg (ref 26.0–34.0)
MCHC: 31.6 g/dL (ref 30.0–36.0)
MCV: 100.7 fL — ABNORMAL HIGH (ref 80.0–100.0)
Platelets: UNDETERMINED 10*3/uL (ref 150–400)
RBC: 2.95 MIL/uL — ABNORMAL LOW (ref 4.22–5.81)
RDW: 15.1 % (ref 11.5–15.5)
WBC: 3.9 10*3/uL — ABNORMAL LOW (ref 4.0–10.5)
nRBC: 0 % (ref 0.0–0.2)

## 2019-09-24 LAB — HEMOGLOBIN A1C
Hgb A1c MFr Bld: 5.2 % (ref 4.8–5.6)
Mean Plasma Glucose: 102.54 mg/dL

## 2019-09-24 LAB — ECHOCARDIOGRAM COMPLETE
AR max vel: 1.68 cm2
AV Area VTI: 1.58 cm2
AV Area mean vel: 1.68 cm2
AV Mean grad: 14 mmHg
AV Peak grad: 26.2 mmHg
Ao pk vel: 2.56 m/s
Area-P 1/2: 2.56 cm2
Height: 72 in
S' Lateral: 3.1 cm
Weight: 2846.58 oz

## 2019-09-24 LAB — HEPATITIS B SURFACE ANTIGEN: Hepatitis B Surface Ag: NONREACTIVE

## 2019-09-24 LAB — LIPID PANEL
Cholesterol: 91 mg/dL (ref 0–200)
HDL: 34 mg/dL — ABNORMAL LOW (ref >40)
LDL Cholesterol: 45 mg/dL (ref 0–99)
Total CHOL/HDL Ratio: 2.7 RATIO
Triglycerides: 61 mg/dL (ref <150)
VLDL: 12 mg/dL (ref 0–40)

## 2019-09-24 LAB — MRSA PCR SCREENING: MRSA by PCR: NEGATIVE

## 2019-09-24 LAB — GLUCOSE, CAPILLARY
Glucose-Capillary: 75 mg/dL (ref 70–99)
Glucose-Capillary: 85 mg/dL (ref 70–99)
Glucose-Capillary: 76 mg/dL (ref 70–99)

## 2019-09-24 LAB — PROTIME-INR
INR: 1.2 (ref 0.8–1.2)
Prothrombin Time: 14.7 seconds (ref 11.4–15.2)

## 2019-09-24 LAB — PHOSPHORUS: Phosphorus: 4.4 mg/dL (ref 2.5–4.6)

## 2019-09-24 MED ORDER — ALBUMIN HUMAN 25 % IV SOLN
0.0000 g | Freq: Once | INTRAVENOUS | Status: AC
  Filled 2019-09-24: qty 400

## 2019-09-24 MED ORDER — LIDOCAINE HCL 1 % IJ SOLN
INTRAMUSCULAR | Status: AC
  Filled 2019-09-24: qty 20

## 2019-09-24 MED ORDER — TAMSULOSIN HCL 0.4 MG PO CAPS
0.4000 mg | ORAL_CAPSULE | Freq: Every day | ORAL | Status: DC
  Administered 2019-09-24: 0.4 mg via ORAL
  Filled 2019-09-24: qty 1

## 2019-09-24 MED ORDER — ALBUMIN HUMAN 25 % IV SOLN
50.0000 g | Freq: Once | INTRAVENOUS | Status: AC
  Administered 2019-09-24: 50 g via INTRAVENOUS
  Filled 2019-09-24: qty 200

## 2019-09-24 MED ORDER — LIDOCAINE HCL 1 % IJ SOLN
INTRAMUSCULAR | Status: DC | PRN
  Administered 2019-09-24: 10 mL

## 2019-09-24 NOTE — Progress Notes (Signed)
Admit: 09/23/2019 LOS: 1  73M present with syncope, severe hyperkalemia, and CKD5--> ESRD  Subjective:  . HD overnight, cannulation of LUE AVF uneventful, tol treatment; K improved this AM .  no c/o this AM, son at bedside . Discussed is now ESRD, likely to [facility] for outpt HD  07/26 0701 - 07/27 0700 In: 281.7 [P.O.:240; IV Piggyback:41.7] Out: 0   Filed Weights   09/23/19 2122 09/24/19 0125 09/24/19 0211  Weight: 84.1 kg 84 kg 80.7 kg    Scheduled Meds: . amLODipine  5 mg Oral Daily  . aspirin EC  81 mg Oral QHS  . atorvastatin  10 mg Oral QHS  . carvedilol  12.5 mg Oral BID  . Chlorhexidine Gluconate Cloth  6 each Topical Q0600  . cloNIDine  0.2 mg Oral BID  . febuxostat  40 mg Oral Daily  . levothyroxine  50 mcg Oral QAC breakfast  . lidocaine      . pantoprazole  40 mg Oral Daily  . sodium bicarbonate  650 mg Oral BID   Continuous Infusions: . albumin human     PRN Meds:.lidocaine  Current Labs: reviewed   Physical Exam:  Blood pressure (!) 120/60, pulse 67, temperature 98.7 F (37.1 C), temperature source Oral, resp. rate 16, height 6' (1.829 m), weight 80.7 kg, SpO2 100 %. NAD RRR no rub CTAB S/nt/nd 1+ LEE LUE AVF +B/T  A 1. 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 LUE AVF mature; started HD 09/23/19 2. Severe hyperkalemia, resolved after HD 3. Syncope, TTE today 4. HTN, BP stable 5. Cirrhosis, rec LVP prn 6. Anemia, on outpt ESA, recent TSAT 65% 7. CKD-BMD  P . HD#2 today, graduated treatments, gentle UF . HD #3 likely on Thursday . CLIP pt . Check Phos . Stop NaHCO3 now on HD . Medication Issues; o Preferred narcotic agents for pain control are hydromorphone, fentanyl, and methadone. Morphine should not be used.  o Baclofen should be avoided o Avoid oral sodium phosphate and magnesium citrate based laxatives / bowel preps    Pearson Grippe MD 09/24/2019, 1:43 PM  Recent Labs  Lab 09/24/19 0251 09/24/19 0639 09/24/19 1115  NA 139 137 137  K 5.2* 5.3* 5.5*  CL  107 106 107  CO2 21* 22 21*  GLUCOSE 86 84 98  BUN 42* 43* 45*  CREATININE 3.78* 4.14* 4.17*  CALCIUM 7.9* 7.6* 7.4*   Recent Labs  Lab 09/23/19 1433  WBC 5.8  HGB 9.7*  HCT 32.2*  MCV 103.2*  PLT 142*

## 2019-09-24 NOTE — Evaluation (Signed)
Physical Therapy Evaluation Patient Details Name: Ricky Harris MRN: [id number] DOB: 07/15/1940 Today's Date: 09/24/2019   History of Present Illness  Pt adm after syncopal episode when getting out of car for scheduled paracentesis. Pt with hyperkalemia and worsening ESRD but had not  yet begun HD. HD initiated in ED. PMH - nonalcoholic cirrhosis with frequent paracenteses, HTN, AAA.   Clinical Impression  Pt admitted with above diagnosis and presents to PT with functional limitations due to deficits listed below (See PT problem list). Pt needs skilled PT to maximize independence and safety to allow discharge to home with assist. Will likely need some assist while adjusting to HD.      Follow Up Recommendations Home health PT;Supervision for mobility/OOB    Equipment Recommendations  None recommended by PT    Recommendations for Other Services       Precautions / Restrictions Precautions Precautions: Fall      Mobility  Bed Mobility Overal bed mobility: Needs Assistance Bed Mobility: Supine to Sit     Supine to sit: Min assist     General bed mobility comments: Assist to elevate trunk into sitting  Transfers Overall transfer level: Needs assistance Equipment used: Rolling walker (2 wheeled) Transfers: Sit to/from [facility] Sit to Stand: Min assist;Min guard Stand pivot transfers: Min assist       General transfer comment: Assist to bring hips up and for balance. Initially min assist progressing to min guard. Bed to bsc with RW  Ambulation/Gait Ambulation/Gait assistance: Min guard Gait Distance (Feet): 175 Feet Assistive device: Rolling walker (2 wheeled) Gait Pattern/deviations: Step-through pattern;Decreased stride length;Trunk flexed Gait velocity: decr Gait velocity interpretation: 1.31 - 2.62 ft/sec, indicative of limited community ambulator General Gait Details: Assist for safety and lines.   Stairs            Wheelchair  Mobility    Modified Rankin (Stroke Patients Only)       Balance Overall balance assessment: Needs assistance Sitting-balance support: No upper extremity supported;Feet supported Sitting balance-Leahy Scale: Fair     Standing balance support: Bilateral upper extremity supported Standing balance-Leahy Scale: Poor Standing balance comment: walker and min guard for static standing                             Pertinent Vitals/Pain Pain Assessment: No/denies pain    Home Living Family/patient expects to be discharged to:: Private residence Living Arrangements: Children Available Help at Discharge: Family;Available 24 hours/day Type of Home: House Home Access: Stairs to enter Entrance Stairs-Rails: Right Entrance Stairs-Number of Steps: 5 Home Layout: One level Home Equipment: Walker - 2 wheels      Prior Function Level of Independence: Independent         Comments: No falls in previous 6 months prior to this syncope     Hand Dominance        Extremity/Trunk Assessment   Upper Extremity Assessment Upper Extremity Assessment: Defer to OT evaluation    Lower Extremity Assessment Lower Extremity Assessment: Generalized weakness       Communication   Communication: HOH  Cognition Arousal/Alertness: Awake/alert Behavior During Therapy: Flat affect Overall Cognitive Status: Impaired/Different from baseline Area of Impairment: Memory                     Memory: Decreased short-term memory                General Comments  Exercises     Assessment/Plan    PT Assessment Patient needs continued PT services  PT Problem List Decreased strength;Decreased activity tolerance;Decreased balance;Decreased mobility;Decreased knowledge of use of DME       PT Treatment Interventions DME instruction;Gait training;Stair training;Functional mobility training;Therapeutic activities;Therapeutic exercise;Balance training;Patient/family  education    PT Goals (Current goals can be found in the Care Plan section)  Acute Rehab PT Goals Patient Stated Goal: return home PT Goal Formulation: With patient Time For Goal Achievement: 10/08/19 Potential to Achieve Goals: Good    Frequency Min 3X/week   Barriers to discharge Inaccessible home environment stairs to enter    Co-evaluation               AM-PAC PT "6 Clicks" Mobility  Outcome Measure Help needed turning from your back to your side while in a flat bed without using bedrails?: A Little Help needed moving from lying on your back to sitting on the side of a flat bed without using bedrails?: A Little Help needed moving to and from a bed to a chair (including a wheelchair)?: A Little Help needed standing up from a chair using your arms (e.g., wheelchair or bedside chair)?: A Little Help needed to walk in hospital room?: A Little Help needed climbing 3-5 steps with a railing? : A Little 6 Click Score: 18    End of Session Equipment Utilized During Treatment: Gait belt Activity Tolerance: Patient limited by fatigue Patient left: in chair;with call bell/phone within reach;Other (comment) (OT heading in for session) Nurse Communication: Mobility status PT Visit Diagnosis: Unsteadiness on feet (R26.81);Other abnormalities of gait and mobility (R26.89);Muscle weakness (generalized) (M62.81)    Time: 1007-1219 PT Time Calculation (min) (ACUTE ONLY): 33 min   Charges:   PT Evaluation $PT Eval Moderate Complexity: 1 Mod PT Treatments $Gait Training: 8-22 mins        [place] Pager [phone] Office [place] 09/24/2019, 3:39 PM

## 2019-09-24 NOTE — Progress Notes (Addendum)
Family Medicine Teaching Service Daily Progress Note Intern Pager: [phone]  Patient name: Ricky Harris Medical record number: [id number] Date of birth: 1940/12/17 Age: 79 y.o. Gender: male  Primary Care Provider: [name]., MD Consultants: Nephrology Code Status: Full  Pt Overview and Major Events to Date:  Admitted on 09/24/2019  Assessment and Plan: EINER MEALS is a 79 y.o. male presenting for fall today. PMH is significant for AAA, cirrhosis, hypertension, CKD stage III, hyperlipidemia, hypothyroidism and normocytic anemia.  Hyperkalemia  K >7.5 on admission >5.8>5.2>5.3 (today). EKG on admission peaked T waves most notable in V2 and V3 with sinus bradycardia. Patient given 5 mg Lokelma, 10 units of short acting insulin (with D50) and calcium gluconate in the ED. Repeat EKG demonstrated NSR and low voltage QRS. Likely secondary to history of ESRD, possibly cirrhosis and volume status on admission could be a contributing factor. Less likely due to CHF given patient's lack of history. BNP 490.9. Not a high concern for medication cause since patient not on any NSAIDs or nephrotoxic meds at home.  -Nephrology consulted, recommendations include dialysis last night  -Continue to monitor BMP -PT/OT for evaluation  Fall  Syncope  Likely contributing factors include arrhythmia from hyperkalemia and vasovagal. Fall on the way to paracentesis, hit head on the concrete outside. On physical exam, small hematoma on the left parietal area noted.Ecchymosis noted on the left lower abdomen and pain to palpation in the lower abdomen.  Neuro exam unremarkable, alert and oriented.  Less suspicion for hyperglycemia being a cause due to both CBG and A1c wnl.  CT head demonstrated mild left parietal scalp soft tissue swelling without acute intracranial abnormality. Also cerebral atrophy and small vessel ischemic change and sinus disease. CT abdomen/pelvis showed large volume abdominopelvis ascites,  hepatic cirrhosis, infrarenal AAA post stent graft repair with residual aneurysm sac measuring 5.5x5.4 cm unchanged from previous CT. Also demonstrated colonic diverticulosis without diverticulitis. Nonobstructing right renal stone and bilateral low-density renal lesions as well as subcentimeter nodule in the lower left kidney that is too small to characterize. Echo demonstrates EF 60-65% -BP meds restarted  -Continue to monitor for mental status changes  Non-Alcoholic Cirrhosis  Stable and chronic. MELD score of 23. Receives therapeutic paracentesis every 2 weeks per chart review.  Patient missed paracentesis scheduled for yesterday due to fall upon arrival.  Last paracentesis 09/09/18/2021 removed 7.5 L. Home med includes sodium bicarbonate. INR and PTT wnl.  Hepatic panel low protein 4.8, albumin 2.4 and AST 14. ALT 11 wnl. Direct bilirubin slightly increased 0.4. BNP elevated at 490.9. Hep B surface antigen nonreactive. -Monitor I/Os and daily weights -Consider palliative care consult given involvement of multiple organs -awaiting CBC  -paracentesis ordered since missed yesterday -Consider follow up GI outpatient   ESRD Cr 4.14, BUN 43, GFR 13. On admission Cr 6.05, BUN 79, bicarb 16 and GFR 8. Patient had HD session last night. Patient has LUE fistula.  -Nephrology consulted, recommended HD inpatient. HD session last night.  -Monitor with BMP -Consider nephrology outpatient follow up and HD outpatient  Hypertension Home meds include amlodipine 5 mg and Coreg 12.5 mg.  133/60 compared to BP on admission 151/71. -Coreg 12.5 mg and amlodipine 5 mg restarted  Urinary retention Per chart review, patient does not have a history of BPH. Bladder scan demonstrated 591 mL. Paged by nurse regarding patient having trouble starting stream. Patient denies this ever occurring before. -Continue to monitor -Consider catheter placement ane tamsulosin if not resolved  Hypothyroidism Nontender thyroid  on exam.  Denies any associated symptoms at this time.  Home meds include Synthroid 30mcg -Continue home meds -Consider TSH if needed -Follow up outpatient  Macrocytic anemia Hemoglobin on admission 9.7 compared to in June 10.7. MCV 103.2 on admission.  -Awaiting CBC, continue to monitor with CBC -Consider transfusion if lower than 7  AAA S/p vascular repair on 01/21/2019 using 3 components. 08/2018 ultrasound showed no acute findings with evidence of cirrhosis with mild ascites. CT abdomen/pelvis demonstrated infrarenal AAA post stent graft repair with residual aneurysm sac measuring 5.5x5.4 cm unchanged from previous CT. -Follows with vascular and vein specialist, Dr. Scot Dock  Hyperlipidemia  Lipid panel 7/27 shows low HDL 34.  Home meds include atorvastatin 10 mg. -Lipitor 10 mg -Follow up with PCP with repeat lipid panel  FEN/GI: renal diet PPx: SCDs  Disposition: Inpatient   Subjective:  Patient seen and examined at bedside. Reports that he has been eating well. Denies chest pain, dyspnea, dizziness and vision changes. States that he remembers yesterday and that it was horrible. He has no new complaints at this time.   Objective: Temp:  [97.6 F (36.4 C)-99.1 F (37.3 C)] 98.7 F (37.1 C) (07/27 1125) Pulse Rate:  [57-91] 67 (07/27 1125) Resp:  [14-25] 16 (07/27 1125) BP: (120-150)/(58-74) 120/60 (07/27 1125) SpO2:  [99 %-100 %] 100 % (07/27 0721) Weight:  [73 kg-84.1 kg] 80.7 kg (07/27 0211) Physical Exam: General: Patient sitting upright comfortably in bed eating breakfast, no acute distress. HEENT: small hematoma located on the left parietal region  Cardiovascular: regular rate, murmur detected around aortic valve Respiratory: lungs clear to auscultation bilaterally, no rhonchi or rales noted Abdomen: tender in LUQ on palpation, slightly distention noted, ecchymosis improved and localized only to the left quadrants. Extremities: 1+ LE edema bilaterally, distal  pulses intact bilaterally, SCDs in place Neuro: AOx4, CN2-12 grossly intact, sensation intact, 4/5 right UE strength and 5/5 strength all other extremities   Psych: mood appropriate, no agitation noted  Laboratory: Recent Labs  Lab 09/23/19 1433  WBC 5.8  HGB 9.7*  HCT 32.2*  PLT 142*   Recent Labs  Lab 09/24/19 0251 09/24/19 0639 09/24/19 1115  NA 139 137 137  K 5.2* 5.3* 5.5*  CL 107 106 107  CO2 21* 22 21*  BUN 42* 43* 45*  CREATININE 3.78* 4.14* 4.17*  CALCIUM 7.9* 7.6* 7.4*  PROT  --  4.8*  --   BILITOT  --  1.0  --   ALKPHOS  --  95  --   ALT  --  11  --   AST  --  14*  --   GLUCOSE 86 84 98      Imaging/Diagnostic Tests: CT ABDOMEN PELVIS WO CONTRAST  Result Date: 09/23/2019 CLINICAL DATA:  79 year old with known cirrhosis and abdominal aortic aneurysm post repair with syncopal episode. Abdominal ecchymosis. EXAM: CT ABDOMEN AND PELVIS WITHOUT CONTRAST TECHNIQUE: Multidetector CT imaging of the abdomen and pelvis was performed following the standard protocol without IV contrast. COMPARISON:  Most recent abdominal CT 03/22/2019 FINDINGS: Lower chest: Chronic elevation of left hemidiaphragm. 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 Adjacent compressive atelectasis. The heart is normal in size with coronary artery calcifications. Hepatobiliary: Hepatic cirrhosis with shrunken liver and nodular contour. Evaluation for focal lesion is limited in the absence of IV contrast and arms down positioning leading to extensive streak artifact, no obvious focal hepatic lesion. Cholecystectomy. No biliary dilatation. Pancreas: Atrophic and not well-defined  on the current exam. No evidence of acute inflammation or ductal dilatation. Spleen: No splenomegaly, greatest splenic dimension of 11.9 cm cranial caudal. No focal abnormality on noncontrast exam. Adrenals/Urinary Tract: No adrenal nodule. Bilateral renal parenchymal atrophy. No hydronephrosis.  Nonobstructing stone in the lower right kidney. Water density lesion in the posterior mid left kidney measures 2.4 cm and is likely cyst. Non-specific 8 mm nodule or rising from the medial lower left kidney, unchanged in size. Additional low-density cysts in the left kidney. Urinary bladder is unremarkable. Stomach/Bowel: Bowel evaluation is limited in the absence of enteric contrast and presence of intra-abdominal ascites. Colonic diverticulosis without diverticulitis. There is colonic tortuosity. Air-filled appendix partially visualized. No small bowel dilatation. No evidence of bowel inflammation. Stomach is nondistended. There are surgical clips adjacent to the distal stomach. Distal most aspect of the rectum is not included in the field of view. Vascular/Lymphatic: Infrarenal abdominal aortic aneurysm post stent graft repair. Residual aneurysm sac eccentric to the left unchanged in size measuring 5.5 x 5.4 cm. There is no periaortic stranding. Dense atherosclerosis of the upper abdominal aorta and branch vessels. No bulky abdominopelvic adenopathy. Reproductive: Prostate gland not included in the field of view. Penile prosthesis with right lower quadrant reservoir is partially included. Other: Large volume abdominopelvic ascites. Ascites appears simple fluid density, upper abdominal density limited by streak artifact from arms down positioning. No free intra-abdominal air. Subcutaneous edema involving the left lower abdominal wall. Musculoskeletal: No acute fracture of the lumbar spine or included pelvis. No acute osseous abnormalities are seen. Degenerative change in the spine and left greater than right hip. IMPRESSION: 1. Large volume abdominopelvic ascites. Subcutaneous edema involving the left lower anterior abdominal wall. 2. Hepatic cirrhosis. 3. Infrarenal abdominal aortic aneurysm post stent graft repair. Residual aneurysm sac eccentric to the left measuring 5.5 x 5.4 cm, unchanged in size from prior  CT. No periaortic stranding. 4. Colonic diverticulosis without diverticulitis. 5. Nonobstructing right renal stone. 6. Bilateral low-density renal lesions as well as subcentimeter nodule in the lower left kidney that is too small to characterize. Findings are stable from January 2021 exam. Aortic Atherosclerosis (ICD10-I70.0). Electronically Signed   By: Keith Rake M.D.   On: 09/23/2019 19:27   CT Head Wo Contrast  Result Date: 09/23/2019 CLINICAL DATA:  Dizziness.  Fall.  Hematoma  posterior left head. EXAM: CT HEAD WITHOUT CONTRAST TECHNIQUE: Contiguous axial images were obtained from the base of the skull through the vertex without intravenous contrast. COMPARISON:  None. FINDINGS: Brain: Moderate low density in the periventricular white matter likely related to small vessel disease. Expected cerebral volume loss for age. No mass lesion, hemorrhage, hydrocephalus, acute infarct, intra-axial, or extra-axial fluid collection. Vascular: Intracranial atherosclerosis. Skull: Mild left parietal scalp soft tissue swelling on 54/4. No skull fracture. Sinuses/Orbits: Normal imaged portions of the orbits and globes. Left maxillary sinus mucous retention cyst or polyp. Other: None. IMPRESSION: 1. Mild left parietal scalp soft tissue swelling, without acute intracranial abnormality. 2. Cerebral atrophy and small vessel ischemic change. 3. Sinus disease. Electronically Signed   By: Abigail Miyamoto M.D.   On: 09/23/2019 19:08   ECHOCARDIOGRAM COMPLETE  Result Date: 09/24/2019    ECHOCARDIOGRAM REPORT   Patient Name:   JOAHAN SWATZELL Date of Exam: 09/24/2019 Medical Rec #:  [id number]      Height:       72.0 in Accession #:    [id number]     Weight:       177.9 lb  Date of Birth:  1940-07-09     BSA:          2.027 m Patient Age:    35 years       BP:           121/63 mmHg Patient Gender: M              HR:           67 bpm. Exam Location:  Inpatient Procedure: 2D Echo, Color Doppler and Cardiac Doppler Indications:     R55 Syncope  History:        Patient has prior history of Echocardiogram examinations, most                 recent 01/14/2019. Risk Factors:Hypertension and Dyslipidemia.                 Prior performed at [facility]:    [facility] Referring Phys: [phone] CARINA M BROWN IMPRESSIONS  1. Left ventricular ejection fraction, by estimation, is 60 to 65%. The left ventricle has normal function. The left ventricle has no regional wall motion abnormalities. Left ventricular diastolic parameters are consistent with Grade I diastolic dysfunction (impaired relaxation).  2. Right ventricular systolic function is normal. The right ventricular size is normal. There is normal pulmonary artery systolic pressure.  3. Left atrial size was mildly dilated.  4. The mitral valve is normal in structure. No evidence of mitral valve regurgitation. No evidence of mitral stenosis.  5. The aortic valve is tricuspid. Aortic valve regurgitation is not visualized. Mild aortic valve stenosis. Aortic valve area, by VTI measures 1.58 cm. Aortic valve mean gradient measures 14.0 mmHg. Aortic valve Vmax measures 2.56 m/s.  6. Aortic dilatation noted. 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  7. The inferior vena cava is normal in size with greater than 50% respiratory variability, suggesting right atrial pressure of 3 mmHg. FINDINGS  Left Ventricle: Left ventricular ejection fraction, by estimation, is 60 to 65%. The left ventricle has normal function. The left ventricle has no regional wall motion abnormalities. The left ventricular internal cavity size was normal in size. There is  no left ventricular hypertrophy. Left ventricular diastolic parameters are consistent with Grade I diastolic dysfunction (impaired relaxation). Right Ventricle: The right ventricular size is normal. No increase in right ventricular wall thickness. Right ventricular systolic function is normal. There is normal pulmonary artery  systolic pressure. The tricuspid regurgitant velocity is 2.21 m/s, and  with an assumed right atrial pressure of 3 mmHg, the estimated right ventricular systolic pressure is 73.7 mmHg. Left Atrium: Left atrial size was mildly dilated. Right Atrium: Right atrial size was normal in size. Pericardium: There is no evidence of pericardial effusion. Mitral Valve: The mitral valve is normal in structure. Normal mobility of the mitral valve leaflets. No evidence of mitral valve regurgitation. No evidence of mitral valve stenosis. Tricuspid Valve: The tricuspid valve is normal in structure. Tricuspid valve regurgitation is not demonstrated. No evidence of tricuspid stenosis. Aortic Valve: The aortic valve is tricuspid. . There is moderate thickening and moderate calcification of the aortic valve. Aortic valve regurgitation is not visualized. Mild aortic stenosis is present. There is moderate thickening of the aortic valve. There is moderate calcification of the aortic valve. Aortic valve mean gradient measures 14.0 mmHg. Aortic valve peak gradient measures 26.2 mmHg. Aortic valve area, by VTI measures 1.58 cm. Pulmonic Valve: The pulmonic valve was normal in structure.  Pulmonic valve regurgitation is not visualized. No evidence of pulmonic stenosis. Aorta: Aortic dilatation noted. 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 Venous: The inferior vena cava is normal in size with greater than 50% respiratory variability, suggesting right atrial pressure of 3 mmHg. IAS/Shunts: No atrial level shunt detected by color flow Doppler.  LEFT VENTRICLE PLAX 2D LVIDd:         3.50 cm  Diastology LVIDs:         3.10 cm  LV e' lateral:   6.96 cm/s LV PW:         0.90 cm  LV E/e' lateral: 8.9 LV IVS:        1.40 cm  LV e' medial:    5.98 cm/s LVOT diam:     2.10 cm  LV E/e' medial:  10.3 LV SV:         93 LV SV Index:   46 LVOT Area:     3.46 cm  RIGHT VENTRICLE RV S prime:     16.80 cm/s TAPSE (M-mode): 3.2 cm LEFT  ATRIUM             Index       RIGHT ATRIUM           Index LA diam:        3.50 cm 1.73 cm/m  RA Area:     18.00 cm LA Vol (A2C):   82.4 ml 40.65 ml/m RA Volume:   47.50 ml  23.43 ml/m LA Vol (A4C):   83.3 ml 41.09 ml/m LA Biplane Vol: 88.0 ml 43.41 ml/m  AORTIC VALVE AV Area (Vmax):    1.68 cm AV Area (Vmean):   1.68 cm AV Area (VTI):     1.58 cm AV Vmax:           256.00 cm/s AV Vmean:          175.000 cm/s AV VTI:            0.586 m AV Peak Grad:      26.2 mmHg AV Mean Grad:      14.0 mmHg LVOT Vmax:         124.00 cm/s LVOT Vmean:        84.700 cm/s LVOT VTI:          0.268 m LVOT/AV VTI ratio: 0.46  AORTA Ao Root diam: 3.60 cm Ao Asc diam:  4.15 cm MITRAL VALVE                TRICUSPID VALVE MV Area (PHT): 2.56 cm     TR Peak grad:   19.5 mmHg MV Decel Time: 296 msec     TR Vmax:        221.00 cm/s MV E velocity: 61.70 cm/s MV A velocity: 105.00 cm/s  SHUNTS MV E/A ratio:  0.59         Systemic VTI:  0.27 m                             Systemic Diam: 2.10 cm Candee Furbish MD Electronically signed by Candee Furbish MD Signature Date/Time: 09/24/2019/11:01:50 AM    Final     Donney Dice, DO 09/24/2019, 1:41 PM PGY-1, [facility] Intern pager: [phone], text pages welcome

## 2019-09-24 NOTE — Progress Notes (Signed)
The resident contacted me about this patient admitted by Dr. Owens Shark for a fall secondary to syncope regarding his urinary retention. 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  Per the resident so far, no neurologic deficit on initial exam, although concerned about neurogenic bladder.  Recommendations provided to Dr. Higinio Plan: Reevaluate patient, obtain hx of back pain given recent fall, and complete a neuro exam. If + low back pain with neurologic deficit, i.e., LL weakness, or loss of sensation, please obtain MRI lumbar to r/o cauda equina due to hx of fall following syncope. However, it will be less likely to develop cauda equina syndrome following a fall.  Have the catheter team attempt to catheterize him. If unsuccessful, we will need to get the urologists onboard for possible suprapubic catheterization. Monitor hydration status closely.  Given difficulty with catheterization, I think this is likely more of obstructive rather than neurogenic urinary retention.

## 2019-09-24 NOTE — Progress Notes (Signed)
Echocardiogram 2D Echocardiogram has been performed.  Oneal Deputy Flower Franko 09/24/2019, 10:49 AM

## 2019-09-24 NOTE — Progress Notes (Signed)
TC to on-call Nephrologist Dr. Royce Macadamia informed lab potassium level 5.8 noted as resulted [id number]. Per Dr. Royce Macadamia extend HD time by 30 minutes to total treatment time 3.5hrs pt without c/o

## 2019-09-24 NOTE — Progress Notes (Signed)
Late documentation due to epic shutdown, events occurred earlier this afternoon approximately around 1830.  Patient noted not to have urinated since admission.  RN obtained several bedside bladder scans showing approximately 500 to 600 cc of presumed retained urine.  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  RN attempted in and out catheterization, however met resistance and unable to perform.  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  Evaluated patient at bedside for alternative etiology of possible urinary retention. CT abdomen/pelvis yesterday without evidence of lumbar or pelvic fractures.  He is very comfortable, no additional abdominal distention or pressure (also had paracentesis this afternoon).  No saddle anesthesia.  Normal bowel movement earlier.  Blood pressure (!) 111/54, pulse 64, temperature 98.9 F (37.2 C), temperature source Oral, resp. rate 19, height 6' (1.829 m), weight 80.7 kg, SpO2 98 %. General: No acute distress, sitting comfortably Abdomen: mild ecchymosis on LLQ. Soft, mildly distended with small fluid wave improved from prior, able to palpate penile prosthesis reservoir in RLQ.  No tenderness above pubic bone or suprapubic region. Lumbar:  - Inspection: no gross deformity or asymmetry, swelling or ecchymosis - Palpation: No TTP over the spinous processes or step off, paraspinal muscles, pelvis, or SI joints b/l - ROM: full active ROM of the lumbar spine in flexion and extension without pain - Strength: 5/5 strength of lower extremity in L4-S1 nerve root distributions b/l - Neuro: sensation intact in the L4-S1 nerve root distribution b/l, 2+ L4 reflexes Rectal: Preserved rectal tone upon digital exam.  A/Plan:  Fortunately neurologically intact without any evidence of  cord compression or substantial pelvic fracture in light of recent syncopal episode with fall. Went ahead and spoke with urology, Dr. Gloriann Loan.  Recommended as patient is comfortable and may have falsely elevated bedside bladder scans due to ascites, recommended monitoring.  Likely does have decreased urine d/t new ESRD.  Recommended to call urology back if patient appears uncomfortable with abdominal fullness/distention. Night team updated on plan at time of events.   Patriciaann Clan, DO

## 2019-09-24 NOTE — Progress Notes (Signed)
Renal Navigator received notification from Dr. Cristal Generous that patient needs referral for OP HD for ESRD. Navigator attempted to meet with patient, however, he was in a procedure. His son [name] was in the room and Navigator notes that he is listed as an Catering manager for patient in [place]. [name] states that patient lives with him. Navigator spoke with son regarding referral for OP HD so not to cause delays. The closest clinic to patient's home is the clinic in [place]. Patient follows with [facility] and patient's son wishes for a referral to be made for a seat in [place]. We discussed general things to expect and patient's son states he and or his fiance are available to transport patient until they determine whether patient is safe to drive himself.  Referral for OP HD treatment for ESRD submitted to [facility] to request a seat at [facility]. Navigator will follow closely.  Alphonzo Cruise, [place] Renal Navigator [phone]

## 2019-09-24 NOTE — Progress Notes (Addendum)
FPTS Interim Progress Note  Received page regarding Mr. Cassis having difficulty starting stream, has not had any urinary output since admission. Reports that this does not occur at home and does not have history of BPH.  -flomax 0.4 mg  -post-void residual  -consider straight catheter if still no output  -continue to monitor    Donney Dice, DO 09/24/2019, 2:54 PM PGY-1, [facility] pager [phone]

## 2019-09-24 NOTE — Progress Notes (Signed)
RN paged intern pager [phone] to advise of bladder scan detecting 591 mls. Pt states has a valve and having difficulty starting stream. RN will check back with patient to see if able to urinate. RN will continue to monitor.

## 2019-09-24 NOTE — Plan of Care (Signed)
  Problem: Education: Goal: Knowledge of General Education information will improve Description: Including pain rating scale, medication(s)/side effects and non-pharmacologic comfort measures 09/24/2019 1926 by Eulis Manly, RN Outcome: Progressing 09/24/2019 1343 by Eulis Manly, RN Outcome: Progressing   Problem: Health Behavior/Discharge Planning: Goal: Ability to manage health-related needs will improve Outcome: Progressing   Problem: Clinical Measurements: Goal: Ability to maintain clinical measurements within normal limits will improve Outcome: Progressing Goal: Will remain free from infection 09/24/2019 1926 by Eulis Manly, RN Outcome: Progressing 09/24/2019 1343 by Silvestre Mesi D, RN Outcome: Progressing Goal: Diagnostic test results will improve 09/24/2019 1926 by Eulis Manly, RN Outcome: Progressing 09/24/2019 1343 by Eulis Manly, RN Outcome: Progressing

## 2019-09-24 NOTE — Evaluation (Signed)
Occupational Therapy Evaluation Patient Details Name: Ricky Harris MRN: [id number] DOB: 05-09-1940 Today's Date: 09/24/2019    History of Present Illness Pt adm after syncopal episode when getting out of car for scheduled paracentesis. Pt with hyperkalemia and worsening ESRD but had not  yet begun HD. HD initiated in ED. PMH - nonalcoholic cirrhosis with frequent paracenteses, HTN, AAA.    Clinical Impression   This 79 y/o male presents with the above. PTA pt living with son, performing ADL, some iADL and mobility tasks independently. Pt currently requiring minA for functional transfers using RW, up to [place] for ADL tasks; mostly with limitations due to weakness, decreased activity tolerance and intermittent dizziness today. Pt with notable beating nystagmus with transition from sit>supine and will likely benefit from further visual/possible vestibular assessment. BP with initial sit<>stand, 121/58, after transition to EOB/return to supine 152/62. He will benefit from continued acute OT services and currently recommend follow up [facility] services after discharge to progress pt towards his PLOF. Will follow.     Follow Up Recommendations  Home health OT;Supervision/Assistance - 24 hour    Equipment Recommendations  3 in 1 bedside commode           Precautions / Restrictions Precautions Precautions: Fall Restrictions Weight Bearing Restrictions: No      Mobility Bed Mobility Overal bed mobility: Needs Assistance Bed Mobility: Sit to Supine     Supine to sit: Min assist Sit to supine: Min assist   General bed mobility comments: for LEs, +dizziness and notable beating nystagmus with transition  Transfers Overall transfer level: Needs assistance Equipment used: Rolling walker (2 wheeled) Transfers: Sit to/from [facility] Sit to Stand: Min assist;Min guard Stand pivot transfers: Min assist       General transfer comment: Assist to bring hips up and for  balance. Initially min assist progressing to min guard. completed x2 sit<>stand, assisted back to bed as RN needing to perform in/out cath     Balance Overall balance assessment: Needs assistance Sitting-balance support: No upper extremity supported;Feet supported Sitting balance-Leahy Scale: Fair     Standing balance support: Bilateral upper extremity supported Standing balance-Leahy Scale: Poor Standing balance comment: walker and min guard for static standing                           ADL either performed or assessed with clinical judgement   ADL Overall ADL's : Needs assistance/impaired Eating/Feeding: Set up;Sitting   Grooming: Wash/dry face;Set up;Supervision/safety;Sitting   Upper Body Bathing: Minimal assistance;Sitting   Lower Body Bathing: Moderate assistance;Sit to/from stand   Upper Body Dressing : Minimal assistance;Sitting   Lower Body Dressing: Moderate assistance;Sit to/from stand   Toilet Transfer: Minimal assistance;Stand-pivot;RW Armed forces technical officer Details (indicate cue type and reason): simulated via transfer recliner to Tensed and Hygiene: Moderate assistance;Sitting/lateral lean;Sit to/from stand Toileting - Clothing Manipulation Details (indicate cue type and reason): pt attempting to use urinal upon arrival but with no luck      Functional mobility during ADLs: Minimal assistance;Rolling walker       Vision Baseline Vision/History: Wears glasses Wears Glasses: Reading only Vision Assessment?: Vision impaired- to be further tested in functional context Additional Comments: notable nystagmus with transition sit>supine and pt with +dizziness (room spinning) - pt not feeling well after episode so did not complete formal assessment today but will benefit from further assessment      Perception     Praxis  Pertinent Vitals/Pain Pain Assessment: No/denies pain     Hand Dominance Right   Extremity/Trunk  Assessment Upper Extremity Assessment Upper Extremity Assessment: LUE deficits/detail;Generalized weakness LUE Deficits / Details: edemous LUE, ROM WFL LUE Coordination: WNL   Lower Extremity Assessment Lower Extremity Assessment: Defer to PT evaluation       Communication Communication Communication: HOH   Cognition Arousal/Alertness: Awake/alert Behavior During Therapy: Flat affect Overall Cognitive Status: Impaired/Different from baseline Area of Impairment: Memory                     Memory: Decreased short-term memory             General Comments  son present end of session     Exercises     Shoulder Instructions      Home Living Family/patient expects to be discharged to:: Private residence Living Arrangements: Children Available Help at Discharge: Family;Available 24 hours/day Type of Home: House Home Access: Stairs to enter [employer] of Steps: 5 Entrance Stairs-Rails: Right Home Layout: One level     Bathroom Shower/Tub: Occupational psychologist: Standard     Home Equipment: Environmental consultant - 2 wheels          Prior Functioning/Environment Level of Independence: Independent        Comments: No falls in previous 6 months prior to this syncope        OT Problem List: Decreased strength;Decreased range of motion;Decreased activity tolerance;Impaired balance (sitting and/or standing);Impaired vision/perception;Decreased cognition;Decreased knowledge of use of DME or AE;Cardiopulmonary status limiting activity      OT Treatment/Interventions: Self-care/ADL training;Therapeutic exercise;Energy conservation;DME and/or AE instruction;Therapeutic activities;Cognitive remediation/compensation;Patient/family education;Visual/perceptual remediation/compensation;Balance training    OT Goals(Current goals can be found in the care plan section) Acute Rehab OT Goals Patient Stated Goal: return home OT Goal Formulation: With  patient Time For Goal Achievement: 10/08/19 Potential to Achieve Goals: Good  OT Frequency: Min 2X/week   Barriers to D/C:            Co-evaluation              AM-PAC OT "6 Clicks" Daily Activity     Outcome Measure Help from another person eating meals?: A Little Help from another person taking care of personal grooming?: A Little Help from another person toileting, which includes using toliet, bedpan, or urinal?: A Lot Help from another person bathing (including washing, rinsing, drying)?: A Lot Help from another person to put on and taking off regular upper body clothing?: A Little Help from another person to put on and taking off regular lower body clothing?: A Lot 6 Click Score: 15   End of Session Equipment Utilized During Treatment: Gait belt;Rolling walker Nurse Communication: Mobility status  Activity Tolerance: Patient tolerated treatment well;Patient limited by fatigue Patient left: in bed;with call bell/phone within reach;with bed alarm set;with family/visitor present  OT Visit Diagnosis: Unsteadiness on feet (R26.81);Muscle weakness (generalized) (M62.81);Dizziness and giddiness (R42)                Time: 2725-3664 OT Time Calculation (min): 25 min Charges:  OT General Charges $OT Visit: 1 Visit OT Evaluation $OT Eval Moderate Complexity: 1 Mod OT Treatments $Self Care/Home Management : 8-22 mins  Lou Cal, OT Acute Rehabilitation Services Pager [phone] Office [phone]   Raymondo Band 09/24/2019, 4:59 PM

## 2019-09-24 NOTE — Procedures (Signed)
Ultrasound-guided therapeutic paracentesis performed yielding 5.2 liters of straw colored fluid. No immediate complications. EBL is none.

## 2019-09-24 NOTE — Plan of Care (Signed)

## 2019-09-24 NOTE — Progress Notes (Signed)
Per orders RN attempted in and out cath. In and cath unsuccessful. Resistance was met.  Pt became uncomfortable and stated "it hurt". RN removed cath. MD notified.

## 2019-09-25 DIAGNOSIS — E875 Hyperkalemia: Secondary | ICD-10-CM | POA: Diagnosis not present

## 2019-09-25 LAB — GLUCOSE, CAPILLARY: Glucose-Capillary: 84 mg/dL (ref 70–99)

## 2019-09-25 LAB — CBC WITH DIFFERENTIAL/PLATELET
Abs Immature Granulocytes: 0.07 10*3/uL (ref 0.00–0.07)
Basophils Absolute: 0 10*3/uL (ref 0.0–0.1)
Basophils Relative: 1 %
Eosinophils Absolute: 0.1 10*3/uL (ref 0.0–0.5)
Eosinophils Relative: 2 %
HCT: 26.4 % — ABNORMAL LOW (ref 39.0–52.0)
Hemoglobin: 8.5 g/dL — ABNORMAL LOW (ref 13.0–17.0)
Immature Granulocytes: 2 %
Lymphocytes Relative: 11 %
Lymphs Abs: 0.4 10*3/uL — ABNORMAL LOW (ref 0.7–4.0)
MCH: 32.6 pg (ref 26.0–34.0)
MCHC: 32.2 g/dL (ref 30.0–36.0)
MCV: 101.1 fL — ABNORMAL HIGH (ref 80.0–100.0)
Monocytes Absolute: 0.5 10*3/uL (ref 0.1–1.0)
Monocytes Relative: 14 %
Neutro Abs: 2.2 10*3/uL (ref 1.7–7.7)
Neutrophils Relative %: 70 %
Platelets: 34 10*3/uL — ABNORMAL LOW (ref 150–400)
RBC: 2.61 MIL/uL — ABNORMAL LOW (ref 4.22–5.81)
RDW: 14.7 % (ref 11.5–15.5)
WBC: 3.2 10*3/uL — ABNORMAL LOW (ref 4.0–10.5)
nRBC: 0 % (ref 0.0–0.2)

## 2019-09-25 LAB — CBC
HCT: 24.8 % — ABNORMAL LOW (ref 39.0–52.0)
Hemoglobin: 8 g/dL — ABNORMAL LOW (ref 13.0–17.0)
MCH: 32.3 pg (ref 26.0–34.0)
MCHC: 32.3 g/dL (ref 30.0–36.0)
MCV: 100 fL (ref 80.0–100.0)
Platelets: 33 10*3/uL — ABNORMAL LOW (ref 150–400)
RBC: 2.48 MIL/uL — ABNORMAL LOW (ref 4.22–5.81)
RDW: 14.7 % (ref 11.5–15.5)
WBC: 3.6 10*3/uL — ABNORMAL LOW (ref 4.0–10.5)
nRBC: 0 % (ref 0.0–0.2)

## 2019-09-25 LAB — BASIC METABOLIC PANEL
Anion gap: 9 (ref 5–15)
BUN: 25 mg/dL — ABNORMAL HIGH (ref 8–23)
CO2: 24 mmol/L (ref 22–32)
Calcium: 7.6 mg/dL — ABNORMAL LOW (ref 8.9–10.3)
Chloride: 105 mmol/L (ref 98–111)
Creatinine, Ser: 2.87 mg/dL — ABNORMAL HIGH (ref 0.61–1.24)
GFR calc Af Amer: 23 mL/min — ABNORMAL LOW (ref >60)
GFR calc non Af Amer: 20 mL/min — ABNORMAL LOW (ref >60)
Glucose, Bld: 85 mg/dL (ref 70–99)
Potassium: 4.1 mmol/L (ref 3.5–5.1)
Sodium: 138 mmol/L (ref 135–145)

## 2019-09-25 LAB — HEPATITIS B SURFACE ANTIBODY, QUANTITATIVE: Hep B S AB Quant (Post): <3.1 m[IU]/mL — ABNORMAL LOW (ref >9.9)

## 2019-09-25 MED ORDER — LOPERAMIDE HCL 2 MG PO CAPS
2.0000 mg | ORAL_CAPSULE | ORAL | Status: DC | PRN
  Filled 2019-09-25: qty 1

## 2019-09-25 MED ORDER — RENA-VITE PO TABS
1.0000 | ORAL_TABLET | Freq: Every day | ORAL | Status: DC
  Administered 2019-09-26 – 2019-09-27 (×2): 1 via ORAL
  Filled 2019-09-25 (×2): qty 1

## 2019-09-25 MED ORDER — CLONIDINE HCL 0.1 MG PO TABS
0.1000 mg | ORAL_TABLET | Freq: Two times a day (BID) | ORAL | Status: DC
  Administered 2019-09-25: 0.1 mg via ORAL
  Filled 2019-09-25: qty 1

## 2019-09-25 NOTE — Progress Notes (Addendum)
Physical Therapy Treatment Patient Details Name: Ricky Harris MRN: [id number] DOB: November 27, 1940 Today's Date: 09/25/2019    History of Present Illness Pt adm after syncopal episode when getting out of car for scheduled paracentesis. Pt with hyperkalemia and worsening ESRD but had not  yet begun HD. HD initiated in ED. PMH - nonalcoholic cirrhosis with frequent paracenteses, HTN, AAA.     PT Comments    Pt making good progress with mobility. After amb pt requested to use the bathroom. Assisted pt to the bathroom. Pt had BM and reported he had urinated for the first time since being here. When asked he stated the amount of urine was "a lot". As amb back to the chair he said on the commode he was able to reach his "valve" and that he had that since prostate surgery. Notified nursing of pt now reporting a urinary valve. I also saw this patient yesterday during which session we got on bsc and tried to use urinal and pt never mentioned or seemed to remember the need to push the button for the valve in order to urinate. The medical teams concern over the patient not urinating didn't seem to cue any memory by the patient of this either. Will obviously need supervision/management at home for his medical needs at time of DC.    Pt reported dizziness with move from supine to sit. When questioned pt also noted that he has had dizziness with head turns. Will have another PT perform vestibular assessment on next session.    Follow Up Recommendations  Home health PT;Supervision for mobility/OOB     Equipment Recommendations  None recommended by PT    Recommendations for Other Services       Precautions / Restrictions Precautions Precautions: Fall    Mobility  Bed Mobility Overal bed mobility: Needs Assistance Bed Mobility: Supine to Sit     Supine to sit: Min guard;HOB elevated     General bed mobility comments: assist for lines only. Incr time to rise  Transfers Overall transfer level:  Needs assistance Equipment used: Rolling walker (2 wheeled) Transfers: Sit to/from [facility] Sit to Stand: Min assist;Min guard         General transfer comment: Min assist to bring hips up from low commode. Min guard to for lines and safety when rising from bed.  Ambulation/Gait Ambulation/Gait assistance: Min guard Gait Distance (Feet): 275 Feet Assistive device: Rolling walker (2 wheeled) Gait Pattern/deviations: Step-through pattern;Decreased stride length;Trunk flexed Gait velocity: decr Gait velocity interpretation: 1.31 - 2.62 ft/sec, indicative of limited community ambulator General Gait Details: Assist for safety and lines.    Stairs             Wheelchair Mobility    Modified Rankin (Stroke Patients Only)       Balance Overall balance assessment: Needs assistance Sitting-balance support: No upper extremity supported;Feet supported Sitting balance-Leahy Scale: Fair     Standing balance support: Bilateral upper extremity supported Standing balance-Leahy Scale: Poor Standing balance comment: walker and supervision for static standing                            Cognition Arousal/Alertness: Awake/alert Behavior During Therapy: Flat affect Overall Cognitive Status: Impaired/Different from baseline Area of Impairment: Memory;Safety/judgement;Problem solving                     Memory: Decreased short-term memory   Safety/Judgement: Decreased awareness of deficits;Decreased awareness  of safety   Problem Solving: Requires verbal cues General Comments: See assessment.      Exercises      General Comments        Pertinent Vitals/Pain      Home Living                      Prior Function            PT Goals (current goals can now be found in the care plan section) Acute Rehab PT Goals Patient Stated Goal: return home Progress towards PT goals: Progressing toward goals    Frequency    Min  3X/week      PT Plan Current plan remains appropriate    Co-evaluation              AM-PAC PT "6 Clicks" Mobility   Outcome Measure  Help needed turning from your back to your side while in a flat bed without using bedrails?: A Little Help needed moving from lying on your back to sitting on the side of a flat bed without using bedrails?: A Little Help needed moving to and from a bed to a chair (including a wheelchair)?: A Little Help needed standing up from a chair using your arms (e.g., wheelchair or bedside chair)?: A Little Help needed to walk in hospital room?: A Little Help needed climbing 3-5 steps with a railing? : A Little 6 Click Score: 18    End of Session Equipment Utilized During Treatment: Gait belt Activity Tolerance: Patient tolerated treatment well Patient left: in chair;with call bell/phone within reach;with chair alarm set;with family/visitor present Nurse Communication: Mobility status;Other (comment) (Pt urinated. Discovery of urinary valve.) PT Visit Diagnosis: Unsteadiness on feet (R26.81);Other abnormalities of gait and mobility (R26.89);Muscle weakness (generalized) (M62.81)     Time: 2863-8177 PT Time Calculation (min) (ACUTE ONLY): 22 min  Charges:  $Gait Training: 8-22 mins                     [place] Pager [phone] Office [place] 09/25/2019, 2:41 PM

## 2019-09-25 NOTE — Progress Notes (Signed)
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 HD #2 overnight, did well . no c/o this AM,  . CLIP in process . 6L LVP yesterday  07/27 0701 - 07/28 0700 In: 240 [P.O.:240] Out: 300   Filed Weights   09/24/19 2102 09/25/19 0000 09/25/19 0634  Weight: 75.6 kg 75.6 kg 74.4 kg    Scheduled Meds: . atorvastatin  10 mg Oral QHS  . carvedilol  12.5 mg Oral BID  . Chlorhexidine Gluconate Cloth  6 each Topical Q0600  . cloNIDine  0.1 mg Oral BID  . febuxostat  40 mg Oral Daily  . levothyroxine  50 mcg Oral QAC breakfast  . multivitamin  1 tablet Oral Daily  . pantoprazole  40 mg Oral Daily   Continuous Infusions:  PRN Meds:.lidocaine  Current Labs: reviewed   Physical Exam:  Blood pressure (!) 111/53, pulse 69, temperature 98 F (36.7 C), temperature source Oral, resp. rate 15, height 6' (1.829 m), weight 74.4 kg, SpO2 98 %. NAD RRR no rub CTAB S/nt/nd Trace  LEE LUE AVF +B/T  A 1. 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 LUE AVF mature; started HD 09/23/19 2. Severe hyperkalemia, resolved after HD 3. Syncope, TTE today 4. HTN, BP stable 5. Cirrhosis, rec LVP prn; s/p 6L removed 7/27 6. 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 HD #3 on Thursday, then THS: 2K, 3h, 300/500, 2-3L UF, no heparin . CLIP in process . Medication Issues; o Preferred narcotic agents for pain control are hydromorphone, fentanyl, and methadone. Morphine should not be used.  o Baclofen should be avoided o Avoid oral sodium phosphate and magnesium citrate based laxatives / bowel preps    Pearson Grippe MD 09/25/2019, 12:15 PM  Recent Labs  Lab 09/24/19 0639 09/24/19 1115 09/24/19 1603 09/25/19 0123  NA 137 137  --  138  K 5.3* 5.5*  --  4.1  CL 106 107  --  105  CO2 22 21*  --  24  GLUCOSE 84 98  --  85  BUN 43* 45*  --  25*  CREATININE 4.14* 4.17*  --  2.87*  CALCIUM 7.6* 7.4*  --  7.6*  PHOS  --   --  4.4  --    Recent Labs   Lab 09/24/19 1603 09/25/19 0123 09/25/19 0933  WBC 3.9* 3.6* 3.2*  NEUTROABS  --   --  2.2  HGB 9.4* 8.0* 8.5*  HCT 29.7* 24.8* 26.4*  MCV 100.7* 100.0 101.1*  PLT PLATELET CLUMPS NOTED ON SMEAR, UNABLE TO ESTIMATE 33* 34*

## 2019-09-25 NOTE — Plan of Care (Signed)

## 2019-09-25 NOTE — Discharge Instructions (Addendum)
Dear Ricky Harris,  Thank you for letting us participate in your care. You were hospitalized for and diagnosed with Hyperkalemia. You were treated with hemodialysis.  POST-HOSPITAL & CARE INSTRUCTIONS 1. Follow up with PCP and continue dialysis. 2. Go to your follow up appointments (listed below)   DOCTOR'S APPOINTMENT   Future Appointments  Date Time Provider [place]  10/02/2019  1:00 PM [facility] - [facility]  10/02/2019  1:40 PM Angelia Mould, MD [facility]    Follow-up Information    [facility], [facility] Follow up.   Specialty: Home Health Services Why: [place], [place] Contact information: [address] [place] [address] [phone]               Take care and be well!  [facility]  [place], [place] [address] [phone]

## 2019-09-25 NOTE — Progress Notes (Signed)
Family Medicine Teaching Service Daily Progress Note Intern Pager: [phone]  Patient name: Ricky Harris Medical record number: [id number] Date of birth: 15-Jul-1940 Age: 79 y.o. Gender: male  Primary Care Provider: [name]., MD Consultants: Nephrology  Code Status: Full  Pt Overview and Major Events to Date:  Admitted on 09/24/2019  Assessment and Plan: Ricky Harris a 79 y.o.malepresenting for fall today. PMH is significant forAAA, cirrhosis, hypertension, CKD stage III, hyperlipidemia, hypothyroidism and normocytic anemia.  Hyperkalemia Likely resolved. K 4.1 today, from 5.5 on 7/27. -Continue to monitor BMP  Urinary retention Denies any urine output since admission. Per chart review, patient does not have a history of BPH. Bladder scan demonstrated 591 mL. Paged by nurse 7/27 afternoon regarding patient having trouble starting stream. Patient denies this ever occurring before. History of penile prosthesis. Attempted straight cath on 7/27, unsuccessful. Patient does not show any signs of discomfort.  -Tamsulosin discontinued -urology informed, continue to monitor. Notify urology again if patient is uncomfortable.  -continue HD and paracentesis as needed  Fall Syncope CT head demonstrated mild left parietal scalp soft tissue swelling without acute intracranial abnormality. Also cerebral atrophy and small vessel ischemic change and sinus disease. Echo demonstrates EF 60-65% -Continue to monitor for mental status changes -continue PT/OT, recommended 3x/week  Non-AlcoholicCirrhosis  Stable and chronic. CT abdomen/pelvis showed large volume abdominopelvis ascites and hepatic cirrhosis. MELD score of 20. Receivestherapeuticparacentesis every 2 weeks per chart review. Home med includes sodium bicarbonate.INR and PTT wnl. Hepatic panel low protein 4.8, albumin 2.4 and AST 14. ALT 11 wnl. Direct bilirubin slightly increased 0.4. BNP elevated at 490.9. Hep B surface  antigen nonreactive. Paracentesis 7/27, removed 6.2L.  -Monitor I/Os and daily weights -Consider palliative care consult given involvement of multiple organs -Continue to monitor CBC -Consider follow up GI outpatient   ESRD stage 5 Chronic, labs improving. Cr 2.87, BUN 25, GFR 20. On admissionCr 6.05, BUN 79, bicarb 16and GFR 8. Patient had HD session last night. Patient hasLUEfistula.  -Nephrology on board. HD session last night.  -Monitor with BMP -Per renal navigator, patient referred to outpatient HD, patient's son and fiance are able to transport patient to HD outpatient until patient is safe to drive himself.  Hypertension Home meds include amlodipine 5 mg and Coreg 12.5 mg. 134/59 today. BP on admission 151/71. -Discontinue amlodipine -Clonidine reduced to 0.1 mg bid -Continue coreg 12.5 mg  Hypothyroidism Nontender thyroid on exam. Denies any associated symptoms at this time. Home meds include Synthroid28mcg -Continue home meds -Consider TSH if needed -Follow up outpatient  Macrocyticanemia Hgb 8.0 and MCV 100. Hemoglobin on admission 9.7 compared to in June 10.7.MCV 103.2 on admission. -Monitor with CBC -Consider transfusion if lower than 7  Thrombocytopenia Patient has previous history, according to chart review. Platelet count 33 today compared to 142 on 7/26. 34 platelet count on CBC w/ diff. Likely secondary to patient's cirrhotic state and clumping platelets.  -discontinued aspirin  -consider smear -consider hem consult -trend plt ct. 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 CT abdomen/pelvis demonstrated infrarenal AAA post stent graft repair with residual aneurysm sac measuring 5.5x5.4 cm unchanged from previous CT. -Follows with vascular and vein specialist, Ricky Harris  Hyperlipidemia Lipid panel 7/27 shows low HDL 34. Home meds include atorvastatin 10 mg. -Lipitor10 mg -Follow up with PCP with repeat lipid panel  FEN/GI: renal diet PPx: SCDs  Disposition: likely home tomorrow if accepted at HD outpatient   Subjective:  Patient seen and examined at bedside. Denies any chest pain, dyspnea, vision changes, nausea, vomiting and any new symptoms. Still denies any urinary output, last know is prior to admission. Admits to BM this morning. He has no new complaints at this time and denies any uncomfortable sensation. States that he has been eating regularly and well. States that he has been ambulating with assistance of walker.   Objective: Temp:  [98.5 F (36.9 C)-99.1 F (37.3 C)] 98.9 F (37.2 C) (07/28 0343) Pulse Rate:  [64-82] 71 (07/28 0343) Resp:  [10-19] 15 (07/28 0343) BP: (105-134)/(53-64) 134/59 (07/28 0343) SpO2:  [96 %-100 %] 96 % (07/28 0343) Weight:  [75.6 kg] 75.6 kg (07/28 0000) Physical Exam: General: Patient laying down comfortably in bed, in no acute distress. Cardiovascular: murmur noted at aortic valve Respiratory: lungs clear to auscultation bilaterally, no rhonchi or rales noted  Abdomen: hyperactive bowel sounds appreciated, ecchymosis noted on LUQ significantly improved but still present, soreness of LUQ Extremities: no LE edema noted bilaterally Neuro: AOx4, CN2-12 grossly intact, 5/5 strength UE, 4/5 strength LE, normal finger to nose testing  Laboratory: Recent Labs  Lab 09/23/19 1433 09/24/19 1603 09/25/19 0123  WBC 5.8 3.9* 3.6*  HGB 9.7* 9.4* 8.0*  HCT 32.2* 29.7* 24.8*  PLT 142* PLATELET CLUMPS NOTED ON SMEAR, UNABLE TO ESTIMATE 33*   Recent Labs  Lab 09/24/19 0639 09/24/19 1115 09/25/19 0123  NA 137 137 138  K 5.3* 5.5* 4.1  CL 106 107 105  CO2 22 21* 24  BUN 43* 45* 25*  CREATININE 4.14* 4.17* 2.87*  CALCIUM 7.6* 7.4* 7.6*  PROT 4.8*  --   --   BILITOT 1.0  --   --   ALKPHOS 95  --    --   ALT 11  --   --   AST 14*  --   --   GLUCOSE 84 98 85      Imaging/Diagnostic Tests: ECHOCARDIOGRAM COMPLETE  Result Date: 09/24/2019    ECHOCARDIOGRAM REPORT   Patient Name:   Ricky Harris Date of Exam: 09/24/2019 Medical Rec #:  [id number]      Height:       72.0 in Accession #:    [id number]     Weight:       177.9 lb Date of Birth:  11/21/1940     BSA:          2.027 m Patient Age:    69 years       BP:           121/63 mmHg Patient Gender: M              HR:           67 bpm. Exam Location:  Inpatient Procedure: 2D Echo, Color Doppler and Cardiac Doppler Indications:    R55 Syncope  History:        Patient has prior history of Echocardiogram examinations, most                 recent 01/14/2019. Risk Factors:Hypertension and Dyslipidemia.                 Prior performed at [place]:    [place] Referring Phys: [phone] CARINA M BROWN IMPRESSIONS  1. Left ventricular ejection fraction, by estimation, is 60 to 65%. The left ventricle has normal  function. The left ventricle has no regional wall motion abnormalities. Left ventricular diastolic parameters are consistent with Grade I diastolic dysfunction (impaired relaxation).  2. Right ventricular systolic function is normal. The right ventricular size is normal. There is normal pulmonary artery systolic pressure.  3. Left atrial size was mildly dilated.  4. The mitral valve is normal in structure. No evidence of mitral valve regurgitation. No evidence of mitral stenosis.  5. The aortic valve is tricuspid. Aortic valve regurgitation is not visualized. Mild aortic valve stenosis. Aortic valve area, by VTI measures 1.58 cm. Aortic valve mean gradient measures 14.0 mmHg. Aortic valve Vmax measures 2.56 m/s.  6. Aortic dilatation noted. 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  7. The inferior vena cava is normal in size with greater than 50% respiratory variability, suggesting right atrial pressure of 3  mmHg. FINDINGS  Left Ventricle: Left ventricular ejection fraction, by estimation, is 60 to 65%. The left ventricle has normal function. The left ventricle has no regional wall motion abnormalities. The left ventricular internal cavity size was normal in size. There is  no left ventricular hypertrophy. Left ventricular diastolic parameters are consistent with Grade I diastolic dysfunction (impaired relaxation). Right Ventricle: The right ventricular size is normal. No increase in right ventricular wall thickness. Right ventricular systolic function is normal. There is normal pulmonary artery systolic pressure. The tricuspid regurgitant velocity is 2.21 m/s, and  with an assumed right atrial pressure of 3 mmHg, the estimated right ventricular systolic pressure is 76.1 mmHg. Left Atrium: Left atrial size was mildly dilated. Right Atrium: Right atrial size was normal in size. Pericardium: There is no evidence of pericardial effusion. Mitral Valve: The mitral valve is normal in structure. Normal mobility of the mitral valve leaflets. No evidence of mitral valve regurgitation. No evidence of mitral valve stenosis. Tricuspid Valve: The tricuspid valve is normal in structure. Tricuspid valve regurgitation is not demonstrated. No evidence of tricuspid stenosis. Aortic Valve: The aortic valve is tricuspid. . There is moderate thickening and moderate calcification of the aortic valve. Aortic valve regurgitation is not visualized. Mild aortic stenosis is present. There is moderate thickening of the aortic valve. There is moderate calcification of the aortic valve. Aortic valve mean gradient measures 14.0 mmHg. Aortic valve peak gradient measures 26.2 mmHg. Aortic valve area, by VTI measures 1.58 cm. Pulmonic Valve: The pulmonic valve was normal in structure. Pulmonic valve regurgitation is not visualized. No evidence of pulmonic stenosis. Aorta: Aortic dilatation noted. There is mild dilatation of the ascending aorta  measuring 42 mm. Venous: The inferior vena cava is normal in size with greater than 50% respiratory variability, suggesting right atrial pressure of 3 mmHg. IAS/Shunts: No atrial level shunt detected by color flow Doppler.  LEFT VENTRICLE PLAX 2D LVIDd:         3.50 cm  Diastology LVIDs:         3.10 cm  LV e' lateral:   6.96 cm/s LV PW:         0.90 cm  LV E/e' lateral: 8.9 LV IVS:        1.40 cm  LV e' medial:    5.98 cm/s LVOT diam:     2.10 cm  LV E/e' medial:  10.3 LV SV:         93 LV SV Index:   46 LVOT Area:     3.46 cm  RIGHT VENTRICLE RV S prime:     16.80 cm/s TAPSE (M-mode):  3.2 cm LEFT ATRIUM             Index       RIGHT ATRIUM           Index LA diam:        3.50 cm 1.73 cm/m  RA Area:     18.00 cm LA Vol (A2C):   82.4 ml 40.65 ml/m RA Volume:   47.50 ml  23.43 ml/m LA Vol (A4C):   83.3 ml 41.09 ml/m LA Biplane Vol: 88.0 ml 43.41 ml/m  AORTIC VALVE AV Area (Vmax):    1.68 cm AV Area (Vmean):   1.68 cm AV Area (VTI):     1.58 cm AV Vmax:           256.00 cm/s AV Vmean:          175.000 cm/s AV VTI:            0.586 m AV Peak Grad:      26.2 mmHg AV Mean Grad:      14.0 mmHg LVOT Vmax:         124.00 cm/s LVOT Vmean:        84.700 cm/s LVOT VTI:          0.268 m LVOT/AV VTI ratio: 0.46  AORTA Ao Root diam: 3.60 cm Ao Asc diam:  4.15 cm MITRAL VALVE                TRICUSPID VALVE MV Area (PHT): 2.56 cm     TR Peak grad:   19.5 mmHg MV Decel Time: 296 msec     TR Vmax:        221.00 cm/s MV E velocity: 61.70 cm/s MV A velocity: 105.00 cm/s  SHUNTS MV E/A ratio:  0.59         Systemic VTI:  0.27 m                             Systemic Diam: 2.10 cm Candee Furbish MD Electronically signed by Candee Furbish MD Signature Date/Time: 09/24/2019/11:01:50 AM    Final    IR Paracentesis  Result Date: 09/24/2019 INDICATION: Patient with cirrhosis with recurrent ascites. Request is for therapeutic paracentesis EXAM: ULTRASOUND GUIDED THERAPEUTIC PARACENTESIS MEDICATIONS: Lidocaine 1% 10 mL COMPLICATIONS: None  immediate. PROCEDURE: Informed written consent was obtained from the patient after a discussion of the risks, benefits and alternatives to treatment. A timeout was performed prior to the initiation of the procedure. Initial ultrasound scanning demonstrates a large amount of ascites within the right lower abdominal quadrant. The right lower abdomen was prepped and draped in the usual sterile fashion. 1% lidocaine was used for local anesthesia. Following this, a 19 gauge, 7-cm, Yueh catheter was introduced. An ultrasound image was saved for documentation purposes. The paracentesis was performed. The catheter was removed and a dressing was applied. The patient tolerated the procedure well without immediate post procedural complication. Patient received post-procedure intravenous albumin; see nursing notes for details. FINDINGS: A total of approximately 6.2 L of straw-colored fluid was removed. IMPRESSION: Successful ultrasound-guided therapeutic paracentesis yielding 6.2 liters of peritoneal fluid. Read by: Rushie Nyhan, NP Electronically Signed   By: Lucrezia Europe M.D.   On: 09/24/2019 15:01    Donney Dice, DO  09/25/2019, 6:25 AM PGY-1, [place] Intern pager: [phone], text pages welcome

## 2019-09-25 NOTE — Progress Notes (Addendum)
Hiram Comber PT working with patient, after walk patient void and BM.  Pt states he had prostate surgery many years ago and has a penile implant with button on the RIGHT and urinary valve placed with button on the LEFT. Pt states he did not remember to push the button yesterday during retention issues. Bedside nursing should not perform I&O cath or foley catheter--urology needs to turn off valve.

## 2019-09-26 DIAGNOSIS — I10 Essential (primary) hypertension: Secondary | ICD-10-CM

## 2019-09-26 DIAGNOSIS — R197 Diarrhea, unspecified: Secondary | ICD-10-CM | POA: Insufficient documentation

## 2019-09-26 DIAGNOSIS — N185 Chronic kidney disease, stage 5: Secondary | ICD-10-CM | POA: Diagnosis not present

## 2019-09-26 DIAGNOSIS — R52 Pain, unspecified: Secondary | ICD-10-CM | POA: Insufficient documentation

## 2019-09-26 DIAGNOSIS — N289 Disorder of kidney and ureter, unspecified: Secondary | ICD-10-CM | POA: Diagnosis not present

## 2019-09-26 LAB — CBC
HCT: 24.4 % — ABNORMAL LOW (ref 39.0–52.0)
Hemoglobin: 7.6 g/dL — ABNORMAL LOW (ref 13.0–17.0)
MCH: 31.4 pg (ref 26.0–34.0)
MCHC: 31.1 g/dL (ref 30.0–36.0)
MCV: 100.8 fL — ABNORMAL HIGH (ref 80.0–100.0)
Platelets: 37 10*3/uL — ABNORMAL LOW (ref 150–400)
RBC: 2.42 MIL/uL — ABNORMAL LOW (ref 4.22–5.81)
RDW: 14.2 % (ref 11.5–15.5)
WBC: 3.2 10*3/uL — ABNORMAL LOW (ref 4.0–10.5)
nRBC: 0 % (ref 0.0–0.2)

## 2019-09-26 LAB — BASIC METABOLIC PANEL
Anion gap: 7 (ref 5–15)
BUN: 32 mg/dL — ABNORMAL HIGH (ref 8–23)
CO2: 25 mmol/L (ref 22–32)
Calcium: 7.1 mg/dL — ABNORMAL LOW (ref 8.9–10.3)
Chloride: 105 mmol/L (ref 98–111)
Creatinine, Ser: 3.83 mg/dL — ABNORMAL HIGH (ref 0.61–1.24)
GFR calc Af Amer: 17 mL/min — ABNORMAL LOW (ref >60)
GFR calc non Af Amer: 14 mL/min — ABNORMAL LOW (ref >60)
Glucose, Bld: 101 mg/dL — ABNORMAL HIGH (ref 70–99)
Potassium: 4.4 mmol/L (ref 3.5–5.1)
Sodium: 137 mmol/L (ref 135–145)

## 2019-09-26 MED ORDER — DARBEPOETIN ALFA 100 MCG/0.5ML IJ SOSY: 100.0000 ug | PREFILLED_SYRINGE | INTRAMUSCULAR | Status: DC

## 2019-09-26 MED ORDER — CLONIDINE HCL 0.1 MG PO TABS
0.1000 mg | ORAL_TABLET | Freq: Two times a day (BID) | ORAL | Status: DC
  Administered 2019-09-26 – 2019-09-27 (×2): 0.1 mg via ORAL
  Filled 2019-09-26 (×2): qty 1

## 2019-09-26 MED ORDER — CLONIDINE HCL 0.1 MG PO TABS: 0.1000 mg | ORAL_TABLET | Freq: Every day | ORAL | Status: DC

## 2019-09-26 NOTE — Progress Notes (Addendum)
Family Medicine Teaching Service Daily Progress Note Intern Pager: [phone]  Patient name: Ricky Harris Medical record number: [id number] Date of birth: 30-Sep-1940 Age: 79 y.o. Gender: male  Primary Care Provider: [name]., MD Consultants: Nephrology  Code Status: Full  Pt Overview and Major Events to Date:  Admitted on 09/24/2019  Assessment and Plan: Ricky Harris a 79 y.o.malepresenting for fall today. PMH is significant forAAA, cirrhosis, hypertension, CKD stage III, hyperlipidemia, hypothyroidism and normocytic anemia.  Hyperkalemia Likely resolved. K 4.4 today, from 5.5 on 7/27. -Continue to monitor BMP  Urinary retention Resolved. Patient forgot that he has to press a valve to allow urination.  -continue HD and paracentesis as needed  Fall Syncope CT head demonstrated mild left parietal scalp soft tissue swelling without acute intracranial abnormality. Also cerebral atrophy and small vessel ischemic change and sinus disease.Echo demonstrates EF 60-65% -Continue to monitor for mental status changes -continue PT/OT, recommended 3x/week  Non-AlcoholicCirrhosis Stable and chronic. CT abdomen/pelvisshowed large volume abdominopelvis ascites and hepatic cirrhosis. MELD score of 22.Receivestherapeuticparacentesis every 2 weeks per chart review. Home med includes sodium bicarbonate.INR and PTTwnl.Hepatic panel low protein 4.8, albumin 2.4 and AST 14. ALT 11 wnl. Direct bilirubin slightly increased 0.4.BNPelevated at 490.9.Hep B surface antigen nonreactive. Paracentesis 7/27, removed 6.2L.  -Monitor I/Os and daily weights -Consider palliative care consultgiven involvement of multiple organs -Continue to monitor CBC -Consider follow up GI outpatient  ESRD stage 5 Chronic, labs improving. Cr 3.83, BUN 32, GFR 14. On admissionCr 6.05, BUN 79, bicarb 16and GFR 8.Patient had HD session last night.Patient hasLUEfistula.  -Nephrology on  board. Has had 2 HD sessions since inpatient  -Monitor with BMP -Per renal navigator, patient referred to outpatient HD, patient's son and fiance are able to transport patient to HD outpatient until patient is safe to drive himself. Accepted to outpatient HD. -Per nephro, morphine should not be used for pain control, baclofen should be avoided. Avoid oral sodium phosphate and magnesium citrate based laxatives and bowel preps.   Hypertension Home meds include amlodipine5 mgand Coreg 12.5 mg.122/70 today. BP on admission 151/71. -Discontinue amlodipine -Clonidine reduced to 0.1 mg bid -Continue coreg 12.5 mg  Hypothyroidism Nontender thyroid on exam. Denies any associated symptoms at this time. Home meds include Synthroid76mcg -Continue home meds -Consider TSH if needed -Follow up outpatient  Macrocyticanemia Hgb 7.6 and MCV 100.8. Hemoglobin on admission 9.7 compared to in June 10.7.MCV 103.2 on admission. -Monitor with CBC -Consider transfusion if lower than 7 -Follow up with PCP  Thrombocytopenia Denies any melena or other bleeding source. Patient has previous history, according to chart review. Platelet count 37 today compared to 34 platelet count on CBC w/ diff. 142 on admission. Likely secondary to patient's cirrhotic state and clumping platelets.  -discontinued aspirin  -If worsens, consider smear and hem consult -trend plt ct. 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 -Follows with vascular and vein specialist, Dr. Scot Dock  Hyperlipidemia Lipid panel 7/27 shows low HDL 34.Home meds include atorvastatin 10 mg. -Lipitor10 mg -Follow up with PCP with  repeat lipid panel  FEN/GI: renal diet  PPx: SCDs  Disposition: potentially home tomorrow after monitoring CBC w/ diff   Subjective:  Patient seen and examined at bedside. Denies chest pain,  dyspnea and any other symptoms. Reports ambulating with the walker with occasional dizziness. Lives with his son and fiancee.   Objective: Temp:  [97.9 F (36.6 C)-98.6 F (37 C)] 98.4 F (36.9 C) (07/29 0330) Pulse Rate:  [65-71] 71 (07/29 0330) Resp:  [12-19] 18 (07/29 0330) BP: (100-122)/(51-70) 122/70 (07/29 0330) SpO2:  [96 %-100 %] 98 % (07/29 0330) Weight:  [74.4 kg-74.9 kg] 74.9 kg (07/29 0451) Physical Exam: General: Patient sitting upright comfortably in bed, in no acute distress. Cardiovascular: aortic valve murmur noted  Respiratory: lungs clear to auscultation bilaterally  Abdomen: ecchymosis present along left quadrants that has improved since yesterday, tenderness of LUQ and LLQ on palpation, active bowel sounds  Extremities: distal pulses intact bilaterally, 1+ pitting edema noted on left LE above ankle  Neuro: AOx4, CN2-12 grossly intact, sensation intact, 4+/5 strength LE bilaterally  Psych: mood appropriate, no agitation noted  Laboratory: Recent Labs  Lab 09/25/19 0123 09/25/19 0933 09/26/19 0154  WBC 3.6* 3.2* 3.2*  HGB 8.0* 8.5* 7.6*  HCT 24.8* 26.4* 24.4*  PLT 33* 34* 37*   Recent Labs  Lab 09/24/19 0639 09/24/19 0639 09/24/19 1115 09/25/19 0123 09/26/19 0154  NA 137   < > 137 138 137  K 5.3*   < > 5.5* 4.1 4.4  CL 106   < > 107 105 105  CO2 22   < > 21* 24 25  BUN 43*   < > 45* 25* 32*  CREATININE 4.14*   < > 4.17* 2.87* 3.83*  CALCIUM 7.6*   < > 7.4* 7.6* 7.1*  PROT 4.8*  --   --   --   --   BILITOT 1.0  --   --   --   --   ALKPHOS 95  --   --   --   --   ALT 11  --   --   --   --   AST 14*  --   --   --   --   GLUCOSE 84   < > 98 85 101*   < > = values in this interval not displayed.      Imaging/Diagnostic Tests: No results  found.  Donney Dice, DO 09/26/2019, 6:16 AM PGY-1, [place] Intern pager: [phone], text pages welcome

## 2019-09-26 NOTE — Progress Notes (Signed)
Patient has been accepted at OP HD clinic/[place] on a TTS schedule with a seat time of 11:40am. He needs to arrive to his appointments at 11:20am.  From speaking with bedside RN, it appears that patient will be ready for discharge today. He will need to have HD here today and then start in the clinic on Saturday. In order to do so, he will need to go to the clinic on Friday (tomorrow) to sign his intake paperwork prior to starting on Saturday, 09/28/19.  Renal Navigator notified Nephrologist/Dr. Joelyn Oms and patient, who is agreeable. Navigator also followed up with patient's son [name] to confirm plan. Navigator left detailed message on son's phone, whose message identified him.   Alphonzo Cruise, [place] Renal Navigator [phone]

## 2019-09-26 NOTE — Progress Notes (Signed)
Physical Therapy Treatment Patient Details Name: Ricky Harris MRN: [id number] DOB: 02/10/1941 Today's Date: 09/26/2019    History of Present Illness Pt adm after syncopal episode when getting out of car for scheduled paracentesis. Pt with hyperkalemia and worsening ESRD but had not  yet begun HD. HD initiated in ED. PMH - nonalcoholic cirrhosis with frequent paracenteses, HTN, AAA.     PT Comments    Pt admitted with above diagnosis. 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 Pt stated dizziness lessened at end of treatment.  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   Pt currently with functional limitations due to balance and endurance deficits. Pt will benefit from skilled PT to increase their independence and safety with mobility to allow discharge to the venue listed below.     Follow Up Recommendations  Home health PT;Supervision for mobility/OOB (vestibular rehab)     Equipment Recommendations  None recommended by PT    Recommendations for Other Services       Precautions / Restrictions Precautions Precautions: Fall Restrictions Weight Bearing Restrictions: No    Mobility  Bed Mobility Overal bed mobility: Needs Assistance Bed Mobility: Supine to Sit     Supine to sit: Min guard;HOB elevated Sit to supine: Min assist   General bed mobility comments: assist for lines only. Incr time to rise  Transfers                 General transfer comment: NT as HD was on way and PT assessing for vestibular issues  Ambulation/Gait                 Stairs             Wheelchair Mobility    Modified Rankin (Stroke Patients Only)       Balance Overall balance assessment: Needs assistance Sitting-balance support: No upper extremity supported;Feet supported Sitting balance-Leahy Scale: Fair                                       Cognition Arousal/Alertness: Awake/alert Behavior During Therapy: Flat affect Overall Cognitive Status: Impaired/Different from baseline Area of Impairment: Memory;Safety/judgement;Problem solving                     Memory: Decreased short-term memory   Safety/Judgement: Decreased awareness of deficits;Decreased awareness of safety   Problem Solving: Requires verbal cues        Exercises      General Comments General comments (skin integrity, edema, etc.): Pt positive for right BPPV and treated with canalith repositioning. Pt reports that dizziness is lessened at end of treatment.        Pertinent Vitals/Pain Pain Assessment: No/denies pain    Home Living                      Prior Function            PT Goals (current goals can now be found in the care plan section) Acute Rehab PT Goals Patient Stated Goal: return home Progress towards PT goals: Progressing toward goals    Frequency    Min 3X/week      PT Plan Current plan remains appropriate    Co-evaluation  AM-PAC PT "6 Clicks" Mobility   Outcome Measure  Help needed turning from your back to your side while in a flat bed without using bedrails?: A Little Help needed moving from lying on your back to sitting on the side of a flat bed without using bedrails?: A Little Help needed moving to and from a bed to a chair (including a wheelchair)?: A Little Help needed standing up from a chair using your arms (e.g., wheelchair or bedside chair)?: A Little Help needed to walk in hospital room?: A Little Help needed climbing 3-5 steps with a railing? : A Little 6 Click Score: 18    End of Session   Activity Tolerance: Patient tolerated treatment well Patient left: with call bell/phone within reach;in bed Nurse Communication: Mobility status PT Visit Diagnosis: Unsteadiness on feet (R26.81);Other abnormalities of gait and mobility  (R26.89);Muscle weakness (generalized) (M62.81)     Time: 4081-4481 PT Time Calculation (min) (ACUTE ONLY): 18 min  Charges:  $Canalith Rep Proc: 8-22 mins                     Jaquez Farrington W,PT Acute Rehabilitation Services Pager:  [phone]  Office:  [place] 09/26/2019, 11:48 AM

## 2019-09-26 NOTE — Progress Notes (Signed)
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 NAEON . He says was using his urethral valve regularly prior to admit . CLIP THS [place] complete .  07/28 0701 - 07/29 0700 In: 240 [P.O.:240] Out: -   Filed Weights   09/25/19 0000 09/25/19 0634 09/26/19 0451  Weight: 75.6 kg 74.4 kg 74.9 kg    Scheduled Meds: . atorvastatin  10 mg Oral QHS  . carvedilol  12.5 mg Oral BID  . Chlorhexidine Gluconate Cloth  6 each Topical Q0600  . cloNIDine  0.1 mg Oral BID   Followed by  . [START ON 09/28/2019] cloNIDine  0.1 mg Oral Daily  . febuxostat  40 mg Oral Daily  . levothyroxine  50 mcg Oral QAC breakfast  . multivitamin  1 tablet Oral Daily  . pantoprazole  40 mg Oral Daily   Continuous Infusions:  PRN Meds:.lidocaine, loperamide  Current Labs: reviewed   Physical Exam:  Blood pressure (!) 133/59, pulse 68, temperature 97.9 F (36.6 C), temperature source Oral, resp. rate 17, height 6' (1.829 m), weight 74.9 kg, SpO2 99 %. NAD RRR no rub CTAB S/nt/nd Trace  LEE LUE AVF +B/T  A 1. 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 LUE AVF mature; started HD 09/23/19 2. Severe hyperkalemia, resolved after HD 3. Syncope, TTE reassuring 4. HTN, BP stable 5. Cirrhosis, rec LVP prn; s/p 6L removed 7/27 6. 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 HD #3 today, then THS: 2K, 3h, 300/500, 2-3L UF, no heparin . OK For DC today post HD . Aranesp today with HD . Medication Issues; o Preferred narcotic agents for pain control are hydromorphone, fentanyl, and methadone. Morphine should not be used.  o Baclofen should be avoided o Avoid oral sodium phosphate and magnesium citrate based laxatives / bowel preps    Pearson Grippe MD 09/26/2019, 10:53 AM  Recent Labs  Lab 09/24/19 1115 09/24/19 1603 09/25/19 0123 09/26/19 0154  NA 137  --  138 137  K 5.5*  --  4.1 4.4  CL 107  --  105 105  CO2 21*  --  24  25  GLUCOSE 98  --  85 101*  BUN 45*  --  25* 32*  CREATININE 4.17*  --  2.87* 3.83*  CALCIUM 7.4*  --  7.6* 7.1*  PHOS  --  4.4  --   --    Recent Labs  Lab 09/25/19 0123 09/25/19 0933 09/26/19 0154  WBC 3.6* 3.2* 3.2*  NEUTROABS  --  2.2  --   HGB 8.0* 8.5* 7.6*  HCT 24.8* 26.4* 24.4*  MCV 100.0 101.1* 100.8*  PLT 33* 34* 37*

## 2019-09-26 NOTE — Plan of Care (Signed)

## 2019-09-26 NOTE — Progress Notes (Signed)
OT Cancellation Note  Patient Details Name: Ricky Harris MRN: [id number] DOB: 11-May-1940   Cancelled Treatment:    Reason Eval/Treat Not Completed: (P) Patient at procedure or test/ unavailable (HD)  Ailanie Ruttan,HILLARY 09/26/2019, 12:41 PM  Maurie Boettcher, OT/L   Acute OT Clinical Specialist Acute Rehabilitation Services Pager [phone] Office [phone]

## 2019-09-26 NOTE — TOC Initial Note (Signed)
Transition of Care [facility]) - Initial/Assessment Note    Patient Details  Name: Ricky Harris MRN: [id number] Date of Birth: 1940/07/30  Transition of Care [facility]) CM/SW Contact:    Zenon Mayo, RN Phone Number: 09/26/2019, 11:14 AM  Clinical Narrative:                 Patient is from home with his son, NCM offered choice for HHPT, [place], he states he does not have a preference , NCM made referral to [facility] with [facility], he is able to take referral. Soc will begin 24 to 48 hrs post dc.    Expected Discharge Plan: [name] Barriers to Discharge: No Barriers Identified   Patient Goals and CMS Choice Patient states their goals for this hospitalization and ongoing recovery are:: get better CMS Medicare.gov Compare Post Acute Care list provided to:: Patient Choice offered to / list presented to : Patient  Expected Discharge Plan and Services Expected Discharge Plan: [place]   Discharge Planning Services: CM Consult Post Acute Care Choice: [place] arrangements for the past 2 months: Single Family Home                           HH Arranged: PT, OT HH Agency: [place] Date [facility] Agency Contacted: 09/26/19 Time HH Agency Contacted: 1114 Representative spoke with at [place]: Tommi Rumps  Prior Living Arrangements/Services Living arrangements for the past 2 months: [place] Lives with:: Adult Children Patient language and need for interpreter reviewed:: Yes Do you feel safe going back to the place where you live?: Yes      Need for Family Participation in Patient Care: Yes (Comment) Care giver support system in place?: Yes (comment)   Criminal Activity/Legal Involvement Pertinent to Current Situation/Hospitalization: No - Comment as needed  Activities of Daily Living      Permission Sought/Granted                  Emotional Assessment Appearance:: Appears stated age Attitude/Demeanor/Rapport:  Engaged Affect (typically observed): Appropriate Orientation: : Oriented to Situation, Oriented to  Time, Oriented to Place, Oriented to Self Alcohol / Substance Use: Not Applicable Psych Involvement: No (comment)  Admission diagnosis:  Hyperkalemia [E87.5] Other ascites [R18.8] Acute on chronic renal insufficiency [N28.9, N18.9] Hematoma of scalp, initial encounter [S00.03XA] Patient Active Problem List   Diagnosis Date Noted  . Hyperkalemia 09/23/2019  . CKD (chronic kidney disease), stage V ([place]) 09/23/2019  . Acute kidney insufficiency 09/23/2019  . Abdominal aortic aneurysm (AAA) ([place]) 01/21/2019  . AAA (abdominal aortic aneurysm) without rupture ([place]) 01/21/2019  . Ascites 05/20/2017  . Normochromic normocytic anemia 05/20/2017  . Essential hypertension 05/20/2017  . CKD (chronic kidney disease) stage 3, GFR 30-59 ml/min 05/20/2017  . HLD (hyperlipidemia) 05/20/2017  . Hypothyroidism 05/20/2017   PCP:  [name]., MD Pharmacy:   [facility], [place] [address] Phone: [phone] Fax: [phone]     Social Determinants of Health (SDOH) Interventions    Readmission Risk Interventions Readmission Risk Prevention Plan 09/26/2019 01/22/2019  Transportation Screening Complete Complete  PCP or Specialist Appt within 5-7 Days - Complete  PCP or Specialist Appt within 3-5 Days Complete -  Home Care Screening - Complete  Medication Review (RN CM) - Complete  HRI or Home Care Consult Complete -  Social  Work Scientific laboratory technician for [place] Planning/Counseling [place] Screening Not Applicable -  Medication Review (RN Care Manager) Complete -  Some recent data might be hidden

## 2019-09-27 DIAGNOSIS — N2581 Secondary hyperparathyroidism of renal origin: Secondary | ICD-10-CM | POA: Insufficient documentation

## 2019-09-27 DIAGNOSIS — N289 Disorder of kidney and ureter, unspecified: Secondary | ICD-10-CM | POA: Diagnosis not present

## 2019-09-27 DIAGNOSIS — T7840XA Allergy, unspecified, initial encounter: Secondary | ICD-10-CM | POA: Insufficient documentation

## 2019-09-27 DIAGNOSIS — E46 Unspecified protein-calorie malnutrition: Secondary | ICD-10-CM | POA: Insufficient documentation

## 2019-09-27 DIAGNOSIS — I10 Essential (primary) hypertension: Secondary | ICD-10-CM | POA: Diagnosis not present

## 2019-09-27 DIAGNOSIS — D509 Iron deficiency anemia, unspecified: Secondary | ICD-10-CM | POA: Insufficient documentation

## 2019-09-27 DIAGNOSIS — N185 Chronic kidney disease, stage 5: Secondary | ICD-10-CM | POA: Diagnosis not present

## 2019-09-27 LAB — CBC WITH DIFFERENTIAL/PLATELET
Abs Immature Granulocytes: 0.01 10*3/uL (ref 0.00–0.07)
Basophils Absolute: 0 10*3/uL (ref 0.0–0.1)
Basophils Relative: 1 %
Eosinophils Absolute: 0.1 10*3/uL (ref 0.0–0.5)
Eosinophils Relative: 3 %
HCT: 25.6 % — ABNORMAL LOW (ref 39.0–52.0)
Hemoglobin: 8 g/dL — ABNORMAL LOW (ref 13.0–17.0)
Immature Granulocytes: 0 %
Lymphocytes Relative: 16 %
Lymphs Abs: 0.6 10*3/uL — ABNORMAL LOW (ref 0.7–4.0)
MCH: 31.5 pg (ref 26.0–34.0)
MCHC: 31.3 g/dL (ref 30.0–36.0)
MCV: 100.8 fL — ABNORMAL HIGH (ref 80.0–100.0)
Monocytes Absolute: 0.5 10*3/uL (ref 0.1–1.0)
Monocytes Relative: 12 %
Neutro Abs: 2.7 10*3/uL (ref 1.7–7.7)
Neutrophils Relative %: 68 %
Platelets: 31 10*3/uL — ABNORMAL LOW (ref 150–400)
RBC: 2.54 MIL/uL — ABNORMAL LOW (ref 4.22–5.81)
RDW: 14.1 % (ref 11.5–15.5)
WBC: 3.9 10*3/uL — ABNORMAL LOW (ref 4.0–10.5)
nRBC: 0 % (ref 0.0–0.2)

## 2019-09-27 LAB — BASIC METABOLIC PANEL
Anion gap: 6 (ref 5–15)
BUN: 20 mg/dL (ref 8–23)
CO2: 28 mmol/L (ref 22–32)
Calcium: 7 mg/dL — ABNORMAL LOW (ref 8.9–10.3)
Chloride: 102 mmol/L (ref 98–111)
Creatinine, Ser: 3.18 mg/dL — ABNORMAL HIGH (ref 0.61–1.24)
GFR calc Af Amer: 21 mL/min — ABNORMAL LOW (ref >60)
GFR calc non Af Amer: 18 mL/min — ABNORMAL LOW (ref >60)
Glucose, Bld: 89 mg/dL (ref 70–99)
Potassium: 3.6 mmol/L (ref 3.5–5.1)
Sodium: 136 mmol/L (ref 135–145)

## 2019-09-27 LAB — GLUCOSE, CAPILLARY: Glucose-Capillary: 98 mg/dL (ref 70–99)

## 2019-09-27 MED ORDER — CLONIDINE HCL 0.1 MG PO TABS: 0.1000 mg | ORAL_TABLET | Freq: Every day | ORAL | 0 refills | Status: DC

## 2019-09-27 MED ORDER — RENA-VITE PO TABS: 1.0000 | ORAL_TABLET | Freq: Every day | ORAL | 0 refills | Status: AC

## 2019-09-27 NOTE — Progress Notes (Signed)
Physical Therapy Treatment Patient Details Name: Ricky Harris MRN: [id number] DOB: Aug 28, 1940 Today's Date: 09/27/2019    History of Present Illness Pt adm after syncopal episode when getting out of car for scheduled paracentesis. Pt with hyperkalemia and worsening ESRD but had not  yet begun HD. HD initiated in ED. PMH - nonalcoholic cirrhosis with frequent paracenteses, HTN, AAA.     PT Comments    Pt admitted with above diagnosis. Pt was able to ambulate with min guard assist and cues for staying close to RW.  Has equipment at home and will have needed assist.  Treatment for vertigo yesterday was effective and pt reports that vertigo is resolved.  Going home today per pt and son.   Pt currently with functional limitations due to endurance deficits. Pt will benefit from skilled PT to increase their independence and safety with mobility to allow discharge to the venue listed below.     Follow Up Recommendations  Home health PT;Supervision for mobility/OOB     Equipment Recommendations  None recommended by PT    Recommendations for Other Services       Precautions / Restrictions Precautions Precautions: Fall Restrictions Weight Bearing Restrictions: No    Mobility  Bed Mobility Overal bed mobility: Needs Assistance Bed Mobility: Supine to Sit     Supine to sit: Supervision Sit to supine: Supervision   General bed mobility comments: No assist needed.   Transfers Overall transfer level: Needs assistance Equipment used: Rolling walker (2 wheeled) Transfers: Sit to/from [facility] Sit to Stand: Min guard         General transfer comment: Pt needed cues only for hand placement.   Ambulation/Gait Ambulation/Gait assistance: Min Gaffer (Feet): 400 Feet Assistive device: Rolling walker (2 wheeled) Gait Pattern/deviations: Step-through pattern;Decreased stride length;Trunk flexed Gait velocity: decr Gait velocity  interpretation: 1.31 - 2.62 ft/sec, indicative of limited community ambulator General Gait Details: Pt was able to ambulate with RW with good safety overall with occasional cues for staying close to RW and to stand tall as pt tends to flex.     Stairs             Wheelchair Mobility    Modified Rankin (Stroke Patients Only)       Balance Overall balance assessment: Needs assistance Sitting-balance support: No upper extremity supported;Feet supported Sitting balance-Leahy Scale: Fair     Standing balance support: Bilateral upper extremity supported Standing balance-Leahy Scale: Fair Standing balance comment:  supervision for static standing                            Cognition Arousal/Alertness: Awake/alert Behavior During Therapy: Flat affect Overall Cognitive Status: Impaired/Different from baseline Area of Impairment: Memory;Safety/judgement;Problem solving                     Memory: Decreased short-term memory   Safety/Judgement: Decreased awareness of deficits;Decreased awareness of safety   Problem Solving: Requires verbal cues        Exercises      General Comments General comments (skin integrity, edema, etc.): Pt reports that dizziness has resolved since treatment yesterday.  No vertigo persisting per pt at this time      Pertinent Vitals/Pain      Home Living                      Prior Function  PT Goals (current goals can now be found in the care plan section) Acute Rehab PT Goals Patient Stated Goal: return home Progress towards PT goals: Progressing toward goals    Frequency    Min 3X/week      PT Plan Current plan remains appropriate    Co-evaluation              AM-PAC PT "6 Clicks" Mobility   Outcome Measure  Help needed turning from your back to your side while in a flat bed without using bedrails?: A Little Help needed moving from lying on your back to sitting on the side of  a flat bed without using bedrails?: A Little Help needed moving to and from a bed to a chair (including a wheelchair)?: A Little Help needed standing up from a chair using your arms (e.g., wheelchair or bedside chair)?: A Little Help needed to walk in hospital room?: A Little Help needed climbing 3-5 steps with a railing? : A Little 6 Click Score: 18    End of Session Equipment Utilized During Treatment: Gait belt Activity Tolerance: Patient tolerated treatment well Patient left: with call bell/phone within reach;in bed;with family/visitor present Nurse Communication: Mobility status PT Visit Diagnosis: Unsteadiness on feet (R26.81);Other abnormalities of gait and mobility (R26.89);Muscle weakness (generalized) (M62.81)     Time: 2778-2423 PT Time Calculation (min) (ACUTE ONLY): 16 min  Charges:  $Gait Training: 8-22 mins                     Ricky Harris W,PT [place] Pager:  [phone]  Office:  [place] 09/27/2019, 11:46 AM

## 2019-09-27 NOTE — Progress Notes (Signed)
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 NAEON . HD yesterday 1.8L UF  07/29 0701 - 07/30 0700 In: -  Out: 1818   Filed Weights   09/26/19 1155 09/26/19 1533 09/27/19 0175  Weight: 75.6 kg 71.7 kg 73.2 kg    Scheduled Meds: . atorvastatin  10 mg Oral QHS  . carvedilol  12.5 mg Oral BID  . Chlorhexidine Gluconate Cloth  6 each Topical Q0600  . cloNIDine  0.1 mg Oral BID   Followed by  . [START ON 09/28/2019] cloNIDine  0.1 mg Oral Daily  . darbepoetin (ARANESP) injection - DIALYSIS  100 mcg Intravenous Q Thu-HD  . febuxostat  40 mg Oral Daily  . levothyroxine  50 mcg Oral QAC breakfast  . multivitamin  1 tablet Oral Daily  . pantoprazole  40 mg Oral Daily   Continuous Infusions:  PRN Meds:.lidocaine, loperamide  Current Labs: reviewed   Physical Exam:  Blood pressure 128/69, pulse 70, temperature 98.1 F (36.7 C), temperature source Oral, resp. rate 15, height 6' (1.829 m), weight 73.2 kg, SpO2 99 %. NAD RRR no rub CTAB S/nt/nd Trace  LEE LUE AVF +B/T  A 1. 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 LUE AVF mature; started HD 09/23/19 2. Severe hyperkalemia, resolved after HD 3. Syncope, TTE reassuring 4. HTN, BP stable 5. Cirrhosis, rec LVP prn; s/p 6L removed 7/27 6. Anemia, on outpt ESA, recent TSAT 65% 7. CKD-BMD.P stable no binders 8. Mechanical urethral valve 9. TCP, moderate, chronic, likely relate to #5  P . OK for DC today, next HD [facility] at [facility] . Needs to leave early for paperowrk at kidney center . Medication Issues; o Preferred narcotic agents for pain control are hydromorphone, fentanyl, and methadone. Morphine should not be used.  o Baclofen should be avoided o Avoid oral sodium phosphate and magnesium citrate based laxatives / bowel preps    Pearson Grippe MD 09/27/2019, 11:08 AM  Recent Labs  Lab 09/24/19 1115 09/24/19 1603 09/25/19 0123 09/26/19 0154 09/27/19 0341  NA   < >  --  138 137 136  K    < >  --  4.1 4.4 3.6  CL   < >  --  105 105 102  CO2   < >  --  24 25 28   GLUCOSE   < >  --  85 101* 89  BUN   < >  --  25* 32* 20  CREATININE   < >  --  2.87* 3.83* 3.18*  CALCIUM   < >  --  7.6* 7.1* 7.0*  PHOS  --  4.4  --   --   --    < > = values in this interval not displayed.   Recent Labs  Lab 09/25/19 0933 09/26/19 0154 09/27/19 0341  WBC 3.2* 3.2* 3.9*  NEUTROABS 2.2  --  2.7  HGB 8.5* 7.6* 8.0*  HCT 26.4* 24.4* 25.6*  MCV 101.1* 100.8* 100.8*  PLT 34* 37* 31*

## 2019-09-27 NOTE — TOC Transition Note (Signed)
Transition of Care [facility]) - CM/SW Discharge Note   Patient Details  Name: Ricky Harris MRN: [id number] Date of Birth: [birth year]  Transition of Care [facility]) CM/SW Contact:  Zenon Mayo, RN Phone Number: 09/27/2019, 11:28 AM   Clinical Narrative:    Patient is for dc today, he is set up with Alvis Lemmings, [place] notified [facility] with Alvis Lemmings of dc today.    Final next level of care: [place] Barriers to Discharge: No Barriers Identified   Patient Goals and CMS Choice Patient states their goals for this hospitalization and ongoing recovery are:: get better CMS Medicare.gov Compare Post Acute Care list provided to:: Patient Choice offered to / list presented to : Patient  Discharge Placement                       Discharge Plan and Services   Discharge Planning Services: CM Consult Post Acute Care Choice: Home Health                    HH Arranged: PT, OT [facility] Agency: [place] Date [place]: 09/26/19 Time [place]: 1114 Representative spoke with at [place]: [place] ([place]) Interventions     Readmission Risk Interventions Readmission Risk Prevention Plan 09/26/2019 01/22/2019  Transportation Screening Complete Complete  PCP or Specialist Appt within 5-7 Days - Complete  PCP or Specialist Appt within 3-5 Days Complete -  Home Care Screening - Complete  Medication Review (RN CM) - Complete  HRI or Home Care Consult Complete -  Social Work Consult for [place] Planning/Counseling Complete -  Palliative Care Screening Not Applicable -  Medication Review Press photographer) Complete -  Some recent data might be hidden

## 2019-09-27 NOTE — Plan of Care (Signed)
  Problem: Education: Goal: Knowledge of General Education information will improve Description: Including pain rating scale, medication(s)/side effects and non-pharmacologic comfort measures Outcome: Adequate for Discharge   Problem: Health Behavior/Discharge Planning: Goal: Ability to manage health-related needs will improve Outcome: Adequate for Discharge   Problem: Clinical Measurements: Goal: Ability to maintain clinical measurements within normal limits will improve Outcome: Adequate for Discharge Goal: Will remain free from infection Outcome: Adequate for Discharge Goal: Diagnostic test results will improve Outcome: Adequate for Discharge Goal: Respiratory complications will improve Outcome: Adequate for Discharge Goal: Cardiovascular complication will be avoided Outcome: Adequate for Discharge   Problem: Activity: Goal: Risk for activity intolerance will decrease Outcome: Adequate for Discharge   Problem: Nutrition: Goal: Adequate nutrition will be maintained Outcome: Adequate for Discharge   Problem: Coping: Goal: Level of anxiety will decrease Outcome: Adequate for Discharge   Problem: Elimination: Goal: Will not experience complications related to bowel motility Outcome: Adequate for Discharge Goal: Will not experience complications related to urinary retention Outcome: Adequate for Discharge   Problem: Pain Managment: Goal: General experience of comfort will improve Outcome: Adequate for Discharge   Problem: Safety: Goal: Ability to remain free from injury will improve Outcome: Adequate for Discharge   Problem: Skin Integrity: Goal: Risk for impaired skin integrity will decrease Outcome: Adequate for Discharge   Discharge instructions given to patient. Questions answered. Educated on new medication regimen, signs/symptoms, restrictions, and follow up appointments. Prescriptions [facility]. PIV DC, hemostasis achieved. Vital signs stable. All  belongings sent home with patient.   Pt escorted by NT via wheelchair to private vehicle driven by son.

## 2019-09-27 NOTE — Progress Notes (Signed)
Family Medicine Teaching Service Daily Progress Note Intern Pager: [phone]  Patient name: Ricky Harris Medical record number: [id number] Date of birth: 1940-10-14 Age: 79 y.o. Gender: male  Primary Care Provider: [name]., MD Consultants: Nephrology  Code Status: Full  Pt Overview and Major Events to Date:  Admitted on 09/24/2019  Assessment and Plan: [name] a 79 y.o.malepresenting for fall today. PMH is significant forAAA, cirrhosis, hypertension, CKD stage III, hyperlipidemia, hypothyroidism and normocytic anemia.  Hyperkalemia Likely resolved. K 3.6 today, from 5.5 on 7/27. -Continue to monitor BMP  Fall Syncope Denies dizziness, vision changes or nay residual symptoms from fall. CT head demonstrated mild left parietal scalp soft tissue swelling without acute intracranial abnormality. Also cerebral atrophy and small vessel ischemic change and sinus disease.Echo demonstrates EF 60-65% -Continue to monitor for mental status changes -continue PT/OT, recommended 3x/week  Non-AlcoholicCirrhosis Stable and chronic. CT abdomen/pelvisshowed large volume abdominopelvis ascitesandhepatic cirrhosis.MELD score of 24.Receivestherapeuticparacentesis every 2 weeks per chart review. Home med includes sodium bicarbonate.INR and PTTwnl.Hepatic panel low protein 4.8, albumin 2.4 and AST 14. ALT 11 wnl. Direct bilirubin slightly increased 0.4.BNPelevated at 490.9.Hep B surface antigen nonreactive.Paracentesis 7/27, removed 6.2L. -Monitor I/Os and daily weights -Continue tomonitor CBC -Consider follow up GI outpatient -Consider palliative care consultgiven involvement of multiple organs  ESRD Chronic, labs improving.Cr3.18, BUN20, [id number]. On admissionCr 6.05, BUN 79, bicarb 16and GFR 8.Patient hasLUEfistula.  -Monitor with BMP  -Per renal navigator, patient referred to outpatient HD, patient's son and fiance are able to transport patient  to HD outpatient until patient is safe to drive himself. Accepted to outpatient HD. -Per nephro, morphine should not be used for pain control, baclofen should be avoided. Avoid oral sodium phosphate and magnesium citrate based laxatives and bowel preps.  -Continue dialysis outpatient   Hypertension Home meds include amlodipine5 mgand Coreg 12.5 mg.128/69 today.BP on admission 151/71. -Continue clonidine 0.1 mg bid -Continue coreg 12.5 mg -Follow up with PCP  Hypothyroidism Nontender thyroid on exam. Denies any associated symptoms at this time. Home meds include Synthroid65mcg -Continue home meds -Consider TSH if needed -Follow up outpatient  Macrocyticanemia Hgb 8.0 and MCV 100.8.Hemoglobin on admission 9.7 compared to in June 10.7.MCV 103.2 on admission. -Monitor with CBC -Consider transfusion if lower than 7 -Follow up with PCP  Thrombocytopenia Denies any melena or other bleeding source. Patient has previous history, according to chart review. Platelet count 31 today compared to 37 platelet count yesterday on CBC w/ diff.142 on admission. Likely secondary topatient's cirrhotic state and clumping platelets.  -If worsens, consider smear and hem consult -trend plt ct. -continue to monitorfor bleeding -Follow up with PCP, CBC next week  AAA S/p vascular repair on 01/21/2019 using 3 components.08/2018 ultrasound showed no acute findings with evidence of cirrhosis with mild ascites.CT abdomen/pelvis demonstrated infrarenal AAA post stent graft repair with residual aneurysm sac measuring 5.5x5.4 cm unchanged from previous CT. -Follows with vascular and vein specialist, Dr. Scot Dock  Hyperlipidemia Lipid panel 7/27 shows low HDL 34.Home meds include atorvastatin 10 mg. -Lipitor10 mg -Follow up with PCP with repeat lipid panel  FEN/GI: renal diet  PPx: SCDs  Disposition: likely home today   Subjective:  Patient seen and examined at bedside. Reports he  has been ambulating with the assistance of his walker. Denies previous dizziness that he had been complaining of over the past 2 days. Denies chest pain, dyspnea, headache, vision changes and generalized or localized pain. Son was present in the room as well, both patient  and son did not have any questions or concerns.   Objective: Temp:  [97.9 F (36.6 C)-99.1 F (37.3 C)] 98.4 F (36.9 C) (07/30 0334) Pulse Rate:  [68-85] 70 (07/30 0334) Resp:  [14-21] 14 (07/30 0334) BP: (94-133)/(48-69) 128/69 (07/30 0334) SpO2:  [95 %-100 %] 97 % (07/30 0334) Weight:  [71.7 kg-75.6 kg] 71.7 kg (07/29 1533) Physical Exam: General: Patient in no acute distress, sitting upright comfortably in bed. Cardiovascular: murmur noted in aortic valve Respiratory: lungs clear to auscultation bilaterally, no rhonchi or rales noted  Abdomen: tender on palpation of LLQ, mild ecchymosis present on left abdomen that has significantly improved since yesterday Extremities: distal pulses intact bilaterally, no LE edema noted  Neuro: AOx4  Laboratory: Recent Labs  Lab 09/25/19 0933 09/26/19 0154 09/27/19 0341  WBC 3.2* 3.2* 3.9*  HGB 8.5* 7.6* 8.0*  HCT 26.4* 24.4* 25.6*  PLT 34* 37* 31*   Recent Labs  Lab 09/24/19 0639 09/24/19 1115 09/25/19 0123 09/26/19 0154 09/27/19 0341  NA 137   < > 138 137 136  K 5.3*   < > 4.1 4.4 3.6  CL 106   < > 105 105 102  CO2 22   < > 24 25 28   BUN 43*   < > 25* 32* 20  CREATININE 4.14*   < > 2.87* 3.83* 3.18*  CALCIUM 7.6*   < > 7.6* 7.1* 7.0*  PROT 4.8*  --   --   --   --   BILITOT 1.0  --   --   --   --   ALKPHOS 95  --   --   --   --   ALT 11  --   --   --   --   AST 14*  --   --   --   --   GLUCOSE 84   < > 85 101* 89   < > = values in this interval not displayed.      Imaging/Diagnostic Tests: No results found.  Donney Dice, DO 09/27/2019, 6:11 AM PGY-1, [place] Intern pager: [phone], text pages welcome

## 2019-09-28 ENCOUNTER — Telehealth: Payer: Self-pay | Admitting: Nephrology

## 2019-09-28 NOTE — Discharge Summary (Addendum)
[facility] Discharge Summary  Patient name: Ricky Harris Medical record number: [id number] Date of birth: 07-02-1940 Age: 79 y.o. Gender: male Date of Admission: 09/23/2019  Date of Discharge: 09/27/2019 Admitting Physician: Martyn Malay, MD  Primary Care Provider: [name]., MD Consultants: Nephrology   Indication for Hospitalization: hyperkalemia and likely syncopal episode   Discharge Diagnoses/Problem List:  Hyperkalemia Non-alcoholic cirrhosis ESRD Hypertension Hypothyroidism Macrocytic anemia Thrombocytopenia History of AAA Hyperlipidemia   Disposition: home  Discharge Condition: medically stable  Discharge Exam:   Temp:  [97.9 F (36.6 C)-99.1 F (37.3 C)] 98.4 F (36.9 C) (07/30 0334) Pulse Rate:  [68-85] 70 (07/30 0334) Resp:  [14-21] 14 (07/30 0334) BP: (94-133)/(48-69) 128/69 (07/30 0334) SpO2:  [95 %-100 %] 97 % (07/30 0334) Weight:  [71.7 kg-75.6 kg] 71.7 kg (07/29 1533) Physical Exam: General: Patient in no acute distress, sitting upright comfortably in bed. Cardiovascular: murmur noted in aortic valve Respiratory: lungs clear to auscultation bilaterally, no rhonchi or rales noted  Abdomen: tender on palpation of LLQ, mild ecchymosis present on left abdomen that has significantly improved since yesterday Extremities: distal pulses intact bilaterally, no LE edema noted  Neuro: AOx4  Brief Hospital Course:  LELYND POER a 79 y.o.malepresenting for fall and hyperkalemia. PMH is significant forAAA, cirrhosis, hypertension, CKD stage III, hyperlipidemia, hypothyroidism and normocytic anemia.  Hyperkalemia Patient presented to hospital for a scheduled paracentesis, fell outside [facility], likely from syncopal episode. Admitted with potassium of >7.5. Given Lokelma, short acting insulin with D5 and calcium gluconate. Physical exam unremarkable except for hematoma in the left parietal region of his head  and significant ecchymosis throughout his abdomen (more prominently in the left quadrants). CT head demonstrated mild left parietal scalp soft tissue swelling without intracranial abnormality with cerebral atrophy and small vessel ischemic change and sinus disease. CT abdomen/pelvis showed large volume abdominopelvic ascites with subcutaneous edema involving the left lower anterior abdominal wall with hepatic cirrhosis. Imaging also demonstrated an infrarenal abdominal aortic aneurysm post stent graft repair with residual aneurysm sac eccentric to the left measuring 5.5x5.4 cm size unchanged from previous CT. EKG demonstrated normal sinus rhythm with low voltage QRS and presence of some peaked T waves. Nephrology was consulted an patient was started on HD. He tolerated it well. Prior to discharge he was set up with outpatient HD. Patient continued to benefit from PT during his hospitalization. BMPs were continually monitored and potassium levels continued to gradually normalize. At discharge, K was 3.6 wnl.   Non-alcoholic cirrhosis  Patient receives therapeutic paracentesis every 2 weeks. He was not able to have his procedure on the original day scheduled due to his fall. CT abdomen/pelvis demonstrated hepatic cirrhosis and large volume abdominopelvic ascites. INR and PTT wnl. Hepatic panel low protein 4.8, albumin 2.4 and AST 14. ALT 11 wnl. Direct bilirubin slightly increased 0.4. Hep B surface antigen nonreactive.Once more stable, patient was able to have paracentesis the next day when 6.2L were removed. Patient's MELD score remained stable ranging between 20-23 during hospital stay.   Thrombocytopenia  Patient admitted with mild thrombocytopenia with platelet count of 142 that gradually worsened. Likely secondary to patient's cirrhotic state and platelet clumping noted. Asprin discontinued and platelet count was continually monitored. On day of discharge, platelet count 31. Strongly recommended to follow  up with PCP and repeat CBC next week.   All other issues stable and chronic.  Issues for Follow Up:  1. Follow up with PCP, repeat  CBC next week to continue to monitor macrocytic anemia and worsening thrombocytopenia. 2. Follow up with PCP regarding HTN medication management and lipid panel.  3. Continue to follow up with Dr. Scot Dock, vascular and vein specialist, for history of AAA. 4. ESRD: Continue outpatient dialysis and follow up with PCP for repeat renal panel  5. Follow up with outpatient GI for periodic paracentesis and monitoring of cirrhotic state. 6. Needs reminder of penile prosthetic valve and button that allows patient to empty his bladder  Significant Procedures:  09/23/2019 Hemodialysis session 09/24/2019 Paracentesis, removal of 6.2 L 09/25/2019 Hemodialysis session   Significant Labs and Imaging:  Recent Labs  Lab 09/25/19 0933 09/26/19 0154 09/27/19 0341  WBC 3.2* 3.2* 3.9*  HGB 8.5* 7.6* 8.0*  HCT 26.4* 24.4* 25.6*  PLT 34* 37* 31*   Recent Labs  Lab 09/24/19 0639 09/24/19 0639 09/24/19 1115 09/24/19 1115 09/24/19 1603 09/25/19 0123 09/25/19 0123 09/26/19 0154 09/27/19 0341  NA 137  --  137  --   --  138  --  137 136  K 5.3*   < > 5.5*   < >  --  4.1   < > 4.4 3.6  CL 106  --  107  --   --  105  --  105 102  CO2 22  --  21*  --   --  24  --  25 28  GLUCOSE 84  --  98  --   --  85  --  101* 89  BUN 43*  --  45*  --   --  25*  --  32* 20  CREATININE 4.14*  --  4.17*  --   --  2.87*  --  3.83* 3.18*  CALCIUM 7.6*  --  7.4*  --   --  7.6*  --  7.1* 7.0*  PHOS  --   --   --   --  4.4  --   --   --   --   ALKPHOS 95  --   --   --   --   --   --   --   --   AST 14*  --   --   --   --   --   --   --   --   ALT 11  --   --   --   --   --   --   --   --   ALBUMIN 2.4*  --   --   --   --   --   --   --   --    < > = values in this interval not displayed.   No results found.  Results/Tests Pending at Time of Discharge:  No results found.  Discharge  Medications:  Allergies as of 09/27/2019      Reactions   Cephalexin Hives   Hydralazine Hives      Medication List    STOP taking these medications   amLODipine 5 MG tablet Commonly known as: NORVASC   aspirin 81 MG EC tablet   Multivitamin Gummies Adult Chew     TAKE these medications   atorvastatin 10 MG tablet Commonly known as: LIPITOR Take 10 mg by mouth at bedtime.   carvedilol 12.5 MG tablet Commonly known as: COREG Take 12.5 mg by mouth 2 (two) times daily.   cloNIDine 0.1 MG tablet Commonly known as: CATAPRES Take 1 tablet (0.1 mg total) by  mouth daily. What changed:   medication strength  how much to take  when to take this   colchicine 0.6 MG tablet Take 0.6 mg by mouth daily as needed (gout flare).   febuxostat 40 MG tablet Commonly known as: ULORIC Take 40 mg by mouth daily.   hydrOXYzine 10 MG tablet Commonly known as: ATARAX/VISTARIL Take 10 mg by mouth 3 (three) times daily.   levothyroxine 50 MCG tablet Commonly known as: SYNTHROID Take 50 mcg by mouth daily.   multivitamin Tabs tablet Take 1 tablet by mouth daily.   pantoprazole 40 MG tablet Commonly known as: Protonix Take 1 tablet (40 mg total) by mouth daily.   sodium bicarbonate 650 MG tablet Take 650 mg by mouth 2 (two) times daily.   traMADol 50 MG tablet Commonly known as: ULTRAM Take 1 tablet (50 mg total) by mouth every 8 (eight) hours as needed for moderate pain.       Discharge Instructions: Please refer to Patient Instructions section of EMR for full details.  Patient was counseled important signs and symptoms that should prompt return to medical care, changes in medications, dietary instructions, activity restrictions, and follow up appointments.   Follow-Up Appointments:  Follow-up Information    [facility], [facility] Follow up.   Specialty: Home Health Services Why: [place], [place] information: [place]  [address] [phone]        [name]., MD. Schedule an appointment as soon as possible for a visit in 2 day(s).   Specialty: Internal Medicine Why: For hospital follow up Contact information: [place] [address] [place], [place], DO 09/28/2019, 2:33 PM PGY-1, [place], DO 09/28/2019, 6:45 PM PGY-2, [place]

## 2019-09-28 NOTE — Telephone Encounter (Signed)
Transition of care contact from inpatient facility  Date of Discharge: 7/30 Date of Contact: 7/31 Method of contact: Phone  Attempted to contact patient to discuss transition of care from inpatient admission. Patient did not answer the phone. Message was left on the patient's voicemail with call back number [phone].  Amalia Hailey, [facility]

## 2019-09-29 ENCOUNTER — Telehealth: Payer: Self-pay | Admitting: Nephrology

## 2019-09-29 NOTE — Telephone Encounter (Signed)
Transition of care contact from inpatient facility  Date of Discharge: 7/30 Date of Contact: 8/1  Method of contact: Phone  Attempted to contact patient to discuss transition of care from inpatient admission. Patient did not answer the phone. Message was left on the patient's voicemail with call back number [phone] if any immediate concerns.  Informed him that rounding provider would be at his dialysis unit on Tuesday.  Amalia Hailey, [facility].

## 2019-09-30 ENCOUNTER — Telehealth: Payer: Self-pay | Admitting: Nephrology

## 2019-09-30 NOTE — Telephone Encounter (Signed)
Transition of Care Contact from [place]  Date of Discharge: 09/27/19 Date of Contact: 09/30/19 Method of contact: phone  Attempted to contact patient to discuss transition of care from inpatient admission.  Patient did not answer the phone.  Message was left on patient's voicemail informing them we would attempt to call them again and if unable to reach will follow up at dialysis.  Jen Mow, PA-C [facility] Pager: [phone]

## 2019-10-02 ENCOUNTER — Other Ambulatory Visit: Payer: Self-pay

## 2019-10-02 ENCOUNTER — Ambulatory Visit (HOSPITAL_COMMUNITY)
Admission: RE | Admit: 2019-10-02 | Discharge: 2019-10-02 | Disposition: A | Discharge status: Home / Self Care | Payer: Medicare Other | Source: Ambulatory Visit | Attending: Vascular Surgery | Admitting: Vascular Surgery

## 2019-10-02 ENCOUNTER — Encounter: Payer: Self-pay | Admitting: Vascular Surgery

## 2019-10-02 ENCOUNTER — Ambulatory Visit (INDEPENDENT_AMBULATORY_CARE_PROVIDER_SITE_OTHER): Payer: Self-pay | Admitting: Vascular Surgery

## 2019-10-02 VITALS — BP 107/64 | HR 79 | Temp 98.0°F | Resp 20 | Ht 72.0 in | Wt 161.0 lb

## 2019-10-02 DIAGNOSIS — Z992 Dependence on renal dialysis: Secondary | ICD-10-CM

## 2019-10-02 DIAGNOSIS — N186 End stage renal disease: Secondary | ICD-10-CM | POA: Diagnosis present

## 2019-10-02 NOTE — Progress Notes (Signed)
   Patient name: Ricky Harris MRN: [id number] DOB: 1940-07-28 Sex: male  REASON FOR VISIT:   Follow-up of left brachiocephalic fistula  HPI:   Ricky Harris is a pleasant 79 y.o. male who had a left brachiocephalic fistula on 0/62/3762.  Of note he has also undergone endovascular aneurysm repair on 01/21/2019.  When I saw him for follow-up after his aneurysm repair his fistula appeared to be maturing nicely.  This was on 03/27/2019.  I felt they can begin using this in April if needed.  He was scheduled for routine follow-up today.  Of note he had already been using his fistula and it has been working well.  Current Outpatient Medications  Medication Sig Dispense Refill  . atorvastatin (LIPITOR) 10 MG tablet Take 10 mg by mouth at bedtime.     . carvedilol (COREG) 12.5 MG tablet Take 12.5 mg by mouth 2 (two) times daily.    . cloNIDine (CATAPRES) 0.1 MG tablet Take 1 tablet (0.1 mg total) by mouth daily. 30 tablet 0  . colchicine 0.6 MG tablet Take 0.6 mg by mouth daily as needed (gout flare).     . febuxostat (ULORIC) 40 MG tablet Take 40 mg by mouth daily.    . hydrOXYzine (ATARAX/VISTARIL) 10 MG tablet Take 10 mg by mouth 3 (three) times daily.    Marland Kitchen levothyroxine (SYNTHROID) 50 MCG tablet Take 50 mcg by mouth daily.    . multivitamin (RENA-VIT) TABS tablet Take 1 tablet by mouth daily. 30 tablet 0  . pantoprazole (PROTONIX) 40 MG tablet Take 1 tablet (40 mg total) by mouth daily. 90 tablet 3  . sodium bicarbonate 650 MG tablet Take 650 mg by mouth 2 (two) times daily.    . traMADol (ULTRAM) 50 MG tablet Take 1 tablet (50 mg total) by mouth every 8 (eight) hours as needed for moderate pain. (Patient not taking: Reported on 10/02/2019) 8 tablet 0   No current facility-administered medications for this visit.    REVIEW OF SYSTEMS:  [X]  denotes positive finding, [ ]  denotes negative finding Vascular    Leg swelling    Cardiac    Chest pain or chest pressure:    Shortness of breath  upon exertion:    Short of breath when lying flat:    Irregular heart rhythm:    Constitutional    Fever or chills:     PHYSICAL EXAM:   Vitals:   10/02/19 1325  BP: 107/64  Pulse: 79  Resp: 20  Temp: 98 F (36.7 C)  SpO2: 97%  Weight: 73 kg  Height: 6' (1.829 m)    GENERAL: The patient is a well-nourished male, in no acute distress. The vital signs are documented above. CARDIOVASCULAR: There is a regular rate and rhythm. PULMONARY: There is good air exchange bilaterally without wheezing or rales. His fistula has an excellent thrill and he has a palpable radial pulse.  DATA:   DUPLEX DIALYSIS ACCESS: I have independently interpreted the duplex of his left upper arm fistula.  This shows that the diameters of the fistula ranged from 0.87-0.96 cm.  Depths are very reasonable.  MEDICAL ISSUES:   STATUS POST AV FISTULA: The patient's left upper arm fistula is working well.  By duplex it has excellent size and is not deep.  I will see him as needed for his access issues.  We will continue to follow his EVAR.  Deitra Mayo [facility] [place] [phone]

## 2019-10-07 ENCOUNTER — Other Ambulatory Visit: Payer: Self-pay

## 2019-10-07 ENCOUNTER — Ambulatory Visit (HOSPITAL_COMMUNITY)
Admission: RE | Admit: 2019-10-07 | Discharge: 2019-10-07 | Disposition: A | Discharge status: Home / Self Care | Payer: Medicare Other | Source: Ambulatory Visit | Attending: Gastroenterology | Admitting: Gastroenterology

## 2019-10-07 DIAGNOSIS — K7460 Unspecified cirrhosis of liver: Secondary | ICD-10-CM

## 2019-10-07 DIAGNOSIS — R188 Other ascites: Secondary | ICD-10-CM

## 2019-10-07 HISTORY — PX: IR PARACENTESIS: IMG2679

## 2019-10-07 MED ORDER — LIDOCAINE HCL 1 % IJ SOLN
INTRAMUSCULAR | Status: AC
  Filled 2019-10-07: qty 20

## 2019-10-07 NOTE — Procedures (Signed)
Ultrasound-guided  therapeutic paracentesis performed yielding 7.9 liters of light yellow colored fluid. No immediate complications. EBL is none.

## 2019-10-17 ENCOUNTER — Other Ambulatory Visit (HOSPITAL_COMMUNITY): Payer: Self-pay | Admitting: Gastroenterology

## 2019-10-17 ENCOUNTER — Other Ambulatory Visit: Payer: Self-pay | Admitting: Gastroenterology

## 2019-10-17 DIAGNOSIS — K7460 Unspecified cirrhosis of liver: Secondary | ICD-10-CM

## 2019-10-17 DIAGNOSIS — R188 Other ascites: Secondary | ICD-10-CM

## 2019-10-23 ENCOUNTER — Other Ambulatory Visit: Payer: Self-pay

## 2019-10-23 ENCOUNTER — Ambulatory Visit (HOSPITAL_COMMUNITY)
Admission: RE | Admit: 2019-10-23 | Discharge: 2019-10-23 | Disposition: A | Discharge status: Home / Self Care | Payer: Medicare Other | Source: Ambulatory Visit | Attending: Gastroenterology | Admitting: Gastroenterology

## 2019-10-23 DIAGNOSIS — R188 Other ascites: Secondary | ICD-10-CM

## 2019-10-23 DIAGNOSIS — K7460 Unspecified cirrhosis of liver: Secondary | ICD-10-CM

## 2019-10-23 MED ORDER — LIDOCAINE HCL 1 % IJ SOLN
INTRAMUSCULAR | Status: AC
  Filled 2019-10-23: qty 20

## 2019-10-23 MED ORDER — LIDOCAINE HCL 1 % IJ SOLN
INTRAMUSCULAR | Status: AC | PRN
  Administered 2019-10-23: 10 mL

## 2019-10-23 NOTE — Procedures (Signed)
PROCEDURE SUMMARY:  Successful US guided paracentesis from left lateral abdomen.  Yielded 5.4 L of clear yellow fluid.  No immediate complications.  Pt tolerated well.   EBL < 19mL  Theresa Duty, NP 10/23/2019 12:09 PM

## 2019-10-31 ENCOUNTER — Other Ambulatory Visit: Payer: Self-pay

## 2019-10-31 ENCOUNTER — Emergency Department (HOSPITAL_COMMUNITY): Payer: Medicare Other

## 2019-10-31 ENCOUNTER — Encounter (HOSPITAL_COMMUNITY): Payer: Self-pay | Admitting: Pediatrics

## 2019-10-31 ENCOUNTER — Emergency Department (HOSPITAL_COMMUNITY)
Admission: EM | Admit: 2019-10-31 | Discharge: 2019-11-01 | Disposition: A | Discharge status: Home / Self Care | Payer: Medicare Other | Attending: Emergency Medicine | Admitting: Emergency Medicine

## 2019-10-31 DIAGNOSIS — R0602 Shortness of breath: Secondary | ICD-10-CM | POA: Diagnosis present

## 2019-10-31 DIAGNOSIS — Z5321 Procedure and treatment not carried out due to patient leaving prior to being seen by health care provider: Secondary | ICD-10-CM | POA: Insufficient documentation

## 2019-10-31 LAB — COMPREHENSIVE METABOLIC PANEL
ALT: 33 U/L (ref 0–44)
AST: 48 U/L — ABNORMAL HIGH (ref 15–41)
Albumin: 2.2 g/dL — ABNORMAL LOW (ref 3.5–5.0)
Alkaline Phosphatase: 205 U/L — ABNORMAL HIGH (ref 38–126)
Anion gap: 11 (ref 5–15)
BUN: 85 mg/dL — ABNORMAL HIGH (ref 8–23)
CO2: 25 mmol/L (ref 22–32)
Calcium: 7.8 mg/dL — ABNORMAL LOW (ref 8.9–10.3)
Chloride: 100 mmol/L (ref 98–111)
Creatinine, Ser: 6.38 mg/dL — ABNORMAL HIGH (ref 0.61–1.24)
GFR calc Af Amer: 9 mL/min — ABNORMAL LOW (ref >60)
GFR calc non Af Amer: 8 mL/min — ABNORMAL LOW (ref >60)
Glucose, Bld: 102 mg/dL — ABNORMAL HIGH (ref 70–99)
Potassium: 6.1 mmol/L — ABNORMAL HIGH (ref 3.5–5.1)
Sodium: 136 mmol/L (ref 135–145)
Total Bilirubin: 0.5 mg/dL (ref 0.3–1.2)
Total Protein: 5.4 g/dL — ABNORMAL LOW (ref 6.5–8.1)

## 2019-10-31 LAB — CBC WITH DIFFERENTIAL/PLATELET
Abs Immature Granulocytes: 0.02 10*3/uL (ref 0.00–0.07)
Basophils Absolute: 0 10*3/uL (ref 0.0–0.1)
Basophils Relative: 1 %
Eosinophils Absolute: 0.1 10*3/uL (ref 0.0–0.5)
Eosinophils Relative: 1 %
HCT: 26.5 % — ABNORMAL LOW (ref 39.0–52.0)
Hemoglobin: 8.1 g/dL — ABNORMAL LOW (ref 13.0–17.0)
Immature Granulocytes: 0 %
Lymphocytes Relative: 11 %
Lymphs Abs: 0.6 10*3/uL — ABNORMAL LOW (ref 0.7–4.0)
MCH: 32.5 pg (ref 26.0–34.0)
MCHC: 30.6 g/dL (ref 30.0–36.0)
MCV: 106.4 fL — ABNORMAL HIGH (ref 80.0–100.0)
Monocytes Absolute: 0.5 10*3/uL (ref 0.1–1.0)
Monocytes Relative: 10 %
Neutro Abs: 4.3 10*3/uL (ref 1.7–7.7)
Neutrophils Relative %: 77 %
Platelets: 161 10*3/uL (ref 150–400)
RBC: 2.49 MIL/uL — ABNORMAL LOW (ref 4.22–5.81)
RDW: 60.3 % — ABNORMAL HIGH (ref 11.5–15.5)
WBC: 5.6 10*3/uL (ref 4.0–10.5)
nRBC: 0.02 % (ref 0.0–0.2)

## 2019-10-31 LAB — TROPONIN I (HIGH SENSITIVITY): Troponin I (High Sensitivity): 14 ng/L (ref <18)

## 2019-10-31 MED ORDER — ALBUTEROL SULFATE (2.5 MG/3ML) 0.083% IN NEBU: 5.0000 mg | INHALATION_SOLUTION | Freq: Once | RESPIRATORY_TRACT | Status: DC

## 2019-10-31 NOTE — ED Notes (Signed)
CALLED X2 FOR VITALS RECHECK WITH NO RESPONSE

## 2019-10-31 NOTE — ED Notes (Signed)
Called for vitals recheck x1 with no response

## 2019-10-31 NOTE — ED Triage Notes (Signed)
C/o shortness of breath, started today.

## 2019-11-01 ENCOUNTER — Other Ambulatory Visit: Payer: Self-pay | Admitting: Gastroenterology

## 2019-11-01 DIAGNOSIS — R109 Unspecified abdominal pain: Secondary | ICD-10-CM

## 2019-11-01 NOTE — ED Notes (Signed)
Called x 3 with no answer.

## 2019-11-01 NOTE — ED Notes (Signed)
Pt called for lab work, no answer x2

## 2019-11-06 ENCOUNTER — Ambulatory Visit (HOSPITAL_COMMUNITY): Admission: RE | Admit: 2019-11-06 | Payer: Medicare Other | Source: Ambulatory Visit

## 2019-11-07 ENCOUNTER — Other Ambulatory Visit: Payer: Self-pay

## 2019-11-07 ENCOUNTER — Ambulatory Visit (HOSPITAL_COMMUNITY)
Admission: RE | Admit: 2019-11-07 | Discharge: 2019-11-07 | Disposition: A | Discharge status: Home / Self Care | Payer: Medicare Other | Source: Ambulatory Visit | Attending: Gastroenterology | Admitting: Gastroenterology

## 2019-11-07 DIAGNOSIS — R188 Other ascites: Secondary | ICD-10-CM | POA: Diagnosis not present

## 2019-11-07 DIAGNOSIS — K7460 Unspecified cirrhosis of liver: Secondary | ICD-10-CM | POA: Diagnosis present

## 2019-11-07 HISTORY — PX: IR PARACENTESIS: IMG2679

## 2019-11-07 MED ORDER — LIDOCAINE HCL 1 % IJ SOLN
INTRAMUSCULAR | Status: AC | PRN
  Administered 2019-11-07: 10 mL

## 2019-11-07 MED ORDER — LIDOCAINE HCL 1 % IJ SOLN
INTRAMUSCULAR | Status: AC
  Filled 2019-11-07: qty 20

## 2019-11-07 NOTE — Procedures (Signed)
PROCEDURE SUMMARY:  Successful image-guided paracentesis from the left lower abdomen.  Yielded 4.2 liters of clear yellow fluid.  No immediate complications.  EBL: zero Patient tolerated well.   Specimen was not sent for labs.  Please see imaging section of Epic for full dictation.  Joaquim Nam PA-C 11/07/2019 10:04 AM

## 2019-11-15 ENCOUNTER — Ambulatory Visit
Admission: RE | Admit: 2019-11-15 | Discharge: 2019-11-15 | Disposition: A | Discharge status: Home / Self Care | Payer: Medicare Other | Source: Ambulatory Visit | Attending: Gastroenterology | Admitting: Gastroenterology

## 2019-11-15 DIAGNOSIS — R109 Unspecified abdominal pain: Secondary | ICD-10-CM

## 2019-11-18 ENCOUNTER — Other Ambulatory Visit: Payer: Self-pay | Admitting: Gastroenterology

## 2019-11-18 ENCOUNTER — Other Ambulatory Visit (HOSPITAL_COMMUNITY): Payer: Self-pay | Admitting: Gastroenterology

## 2019-11-18 DIAGNOSIS — K7460 Unspecified cirrhosis of liver: Secondary | ICD-10-CM

## 2019-11-18 DIAGNOSIS — R188 Other ascites: Secondary | ICD-10-CM

## 2019-11-19 DIAGNOSIS — Z23 Encounter for immunization: Secondary | ICD-10-CM | POA: Insufficient documentation

## 2019-11-21 ENCOUNTER — Ambulatory Visit (HOSPITAL_COMMUNITY)
Admission: RE | Admit: 2019-11-21 | Discharge: 2019-11-21 | Disposition: A | Discharge status: Home / Self Care | Payer: Medicare Other | Source: Ambulatory Visit | Attending: Gastroenterology | Admitting: Gastroenterology

## 2019-11-21 ENCOUNTER — Other Ambulatory Visit: Payer: Self-pay

## 2019-11-21 DIAGNOSIS — R188 Other ascites: Secondary | ICD-10-CM | POA: Diagnosis not present

## 2019-11-21 DIAGNOSIS — K7460 Unspecified cirrhosis of liver: Secondary | ICD-10-CM | POA: Diagnosis not present

## 2019-11-21 MED ORDER — LIDOCAINE HCL (PF) 1 % IJ SOLN
INTRAMUSCULAR | Status: AC | PRN
  Administered 2019-11-21: 10 mL

## 2019-11-21 MED ORDER — ALBUMIN HUMAN 25 % IV SOLN
INTRAVENOUS | Status: AC
  Filled 2019-11-21: qty 200

## 2019-11-21 MED ORDER — ALBUMIN HUMAN 25 % IV SOLN
50.0000 g | Freq: Once | INTRAVENOUS | Status: AC
  Administered 2019-11-21: 50 g via INTRAVENOUS
  Filled 2019-11-21: qty 200

## 2019-11-21 MED ORDER — LIDOCAINE HCL 1 % IJ SOLN
INTRAMUSCULAR | Status: AC
  Filled 2019-11-21: qty 20

## 2019-11-21 NOTE — Procedures (Signed)
PROCEDURE SUMMARY:  Successful image-guided paracentesis from the left lower abdomen.  Yielded 4.3 liters of clear yellow fluid.  No immediate complications.  EBL: zero Patient tolerated well.   Specimen was not sent for labs.  Please see imaging section of Epic for full dictation.  Joaquim Nam PA-C 11/21/2019 1:29 PM

## 2019-11-29 ENCOUNTER — Other Ambulatory Visit: Payer: Self-pay | Admitting: Family Medicine

## 2019-12-02 ENCOUNTER — Other Ambulatory Visit: Payer: Self-pay | Admitting: Gastroenterology

## 2019-12-02 DIAGNOSIS — K7460 Unspecified cirrhosis of liver: Secondary | ICD-10-CM

## 2019-12-04 ENCOUNTER — Ambulatory Visit (HOSPITAL_COMMUNITY)
Admission: RE | Admit: 2019-12-04 | Discharge: 2019-12-04 | Disposition: A | Discharge status: Home / Self Care | Payer: Medicare Other | Source: Ambulatory Visit | Attending: Gastroenterology | Admitting: Gastroenterology

## 2019-12-04 ENCOUNTER — Other Ambulatory Visit: Payer: Self-pay

## 2019-12-04 DIAGNOSIS — K7460 Unspecified cirrhosis of liver: Secondary | ICD-10-CM

## 2019-12-04 DIAGNOSIS — R188 Other ascites: Secondary | ICD-10-CM | POA: Insufficient documentation

## 2019-12-04 HISTORY — PX: IR PARACENTESIS: IMG2679

## 2019-12-04 MED ORDER — ALBUMIN HUMAN 25 % IV SOLN
INTRAVENOUS | Status: AC
  Filled 2019-12-04: qty 200

## 2019-12-04 MED ORDER — LIDOCAINE HCL 1 % IJ SOLN
INTRAMUSCULAR | Status: AC
  Filled 2019-12-04: qty 20

## 2019-12-04 MED ORDER — ALBUMIN HUMAN 25 % IV SOLN
50.0000 g | Freq: Once | INTRAVENOUS | Status: AC
  Administered 2019-12-04: 50 g via INTRAVENOUS
  Filled 2019-12-04: qty 200

## 2019-12-04 NOTE — Procedures (Signed)
PROCEDURE SUMMARY:  Successful US guided paracentesis from right lateral abdomen.  Yielded 3.5 liters of yellow fluid.  No immediate complications.  Pt tolerated well.   Specimen was not sent for labs.  EBL < 49mL  Docia Barrier PA-C 12/04/2019 3:07 PM

## 2019-12-23 ENCOUNTER — Other Ambulatory Visit: Payer: Self-pay | Admitting: Gastroenterology

## 2019-12-23 DIAGNOSIS — R188 Other ascites: Secondary | ICD-10-CM

## 2019-12-23 DIAGNOSIS — K7460 Unspecified cirrhosis of liver: Secondary | ICD-10-CM

## 2019-12-25 ENCOUNTER — Ambulatory Visit (HOSPITAL_COMMUNITY)
Admission: RE | Admit: 2019-12-25 | Discharge: 2019-12-25 | Disposition: A | Discharge status: Home / Self Care | Payer: Medicare Other | Source: Ambulatory Visit | Attending: Vascular Surgery | Admitting: Vascular Surgery

## 2019-12-25 ENCOUNTER — Other Ambulatory Visit: Payer: Self-pay

## 2019-12-25 ENCOUNTER — Ambulatory Visit (HOSPITAL_COMMUNITY)
Admission: RE | Admit: 2019-12-25 | Discharge: 2019-12-25 | Disposition: A | Discharge status: Home / Self Care | Payer: Medicare Other | Source: Ambulatory Visit | Attending: Gastroenterology | Admitting: Gastroenterology

## 2019-12-25 ENCOUNTER — Ambulatory Visit (INDEPENDENT_AMBULATORY_CARE_PROVIDER_SITE_OTHER): Payer: Medicare Other | Admitting: Vascular Surgery

## 2019-12-25 ENCOUNTER — Encounter: Payer: Self-pay | Admitting: Vascular Surgery

## 2019-12-25 ENCOUNTER — Ambulatory Visit (INDEPENDENT_AMBULATORY_CARE_PROVIDER_SITE_OTHER)
Admission: RE | Admit: 2019-12-25 | Discharge: 2019-12-25 | Disposition: A | Discharge status: Home / Self Care | Payer: Medicare Other | Source: Ambulatory Visit | Attending: Vascular Surgery | Admitting: Vascular Surgery

## 2019-12-25 VITALS — BP 160/87 | HR 86 | Temp 98.3°F | Resp 20 | Ht 72.0 in | Wt 170.0 lb

## 2019-12-25 DIAGNOSIS — Z992 Dependence on renal dialysis: Secondary | ICD-10-CM | POA: Diagnosis not present

## 2019-12-25 DIAGNOSIS — I714 Abdominal aortic aneurysm, without rupture, unspecified: Secondary | ICD-10-CM

## 2019-12-25 DIAGNOSIS — Z48812 Encounter for surgical aftercare following surgery on the circulatory system: Secondary | ICD-10-CM

## 2019-12-25 DIAGNOSIS — K7460 Unspecified cirrhosis of liver: Secondary | ICD-10-CM | POA: Insufficient documentation

## 2019-12-25 DIAGNOSIS — N186 End stage renal disease: Secondary | ICD-10-CM | POA: Diagnosis not present

## 2019-12-25 DIAGNOSIS — R188 Other ascites: Secondary | ICD-10-CM | POA: Insufficient documentation

## 2019-12-25 HISTORY — PX: IR PARACENTESIS: IMG2679

## 2019-12-25 MED ORDER — ALBUMIN HUMAN 25 % IV SOLN
INTRAVENOUS | Status: AC
  Filled 2019-12-25: qty 200

## 2019-12-25 MED ORDER — ALBUMIN HUMAN 25 % IV SOLN
50.0000 g | Freq: Once | INTRAVENOUS | Status: AC
  Administered 2019-12-25: 50 g via INTRAVENOUS
  Filled 2019-12-25: qty 200

## 2019-12-25 MED ORDER — LIDOCAINE HCL 1 % IJ SOLN
INTRAMUSCULAR | Status: AC
  Filled 2019-12-25: qty 20

## 2019-12-25 MED ORDER — LIDOCAINE HCL 1 % IJ SOLN
INTRAMUSCULAR | Status: DC | PRN
  Administered 2019-12-25: 10 mL

## 2019-12-25 NOTE — Progress Notes (Signed)
REASON FOR VISIT:   Follow-up after endovascular aneurysm repair  MEDICAL ISSUES:   S/P ENDOVASCULAR ANEURYSM REPAIR: The patient's abdominal aortic aneurysm has not changed in size compared to its original diameter when it was repaired in November 2020.  Patient is overall doing well.   I have ordered a follow-up duplex in 9 months and I will see him back at that time.  He is not on aspirin I have asked him to begin taking 81 mg of aspirin daily.  We had previously discussed this.  He is on a statin.  END-STAGE RENAL DISEASE: He is now on dialysis and his fistula is working well.  HPI:   Ricky Harris is a pleasant 79 y.o. male who I last saw on 10/02/2019.  This patient underwent an endovascular aneurysm repair on 01/21/2019.  He also had a left brachiocephalic fistula on 7/68/1157.  When I saw him last his fistula was maturing nicely I felt they could begin using this in April if needed.  Since I saw him last, he has no specific complaints.  He is now on dialysis and his fistula has been working well.  He denies any recent uremic symptoms.  He denies any abdominal pain or back pain.  Past Medical History:  Diagnosis Date  . AAA (abdominal aortic aneurysm) ([place])    5.3cm , magd by vascular Dr Scot Dock ,  . Anemia    " slightly"  . Aortic stenosis   . Arthritis    knees  . Ascites   . Cancer [facility])    prostate  . Chronic kidney disease    stage 4, mgd by Dr Justin Mend   . 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([place])   . Coronary artery disease    on CT scan  . Gout   . H/O hypercholesterolemia   . Heart murmur   . History of falling    recent- see ct chest results of 04/25/2018  . Hypertension   . Hypothyroidism   . Liver cirrhosis ([place])   . Prediabetes    pt denies   . Renal artery stenosis ([place])   . Thrombocytopenia ([place])   . Wears glasses   . Wears glasses     Family History  Problem Relation Age of Onset  . Diabetes Mellitus II Mother   . Stroke Mother      SOCIAL HISTORY: Social History   Tobacco Use  . Smoking status: Never Smoker  . Smokeless tobacco: Never Used  Substance Use Topics  . Alcohol use: Not Currently    Allergies  Allergen Reactions  . Cephalexin Hives  . Hydralazine Hives    Current Outpatient Medications  Medication Sig Dispense Refill  . atorvastatin (LIPITOR) 10 MG tablet Take 10 mg by mouth at bedtime.     . carvedilol (COREG) 12.5 MG tablet Take 12.5 mg by mouth 2 (two) times daily.    . cloNIDine (CATAPRES) 0.1 MG tablet Take 1 tablet (0.1 mg total) by mouth daily. 30 tablet 0  . colchicine 0.6 MG tablet Take 0.6 mg by mouth daily as needed (gout flare).     . febuxostat (ULORIC) 40 MG tablet Take 40 mg by mouth daily.    . hydrOXYzine (ATARAX/VISTARIL) 10 MG tablet Take 10 mg by mouth 3 (three) times daily.    Marland Kitchen levofloxacin (LEVAQUIN) 500 MG tablet Take 500 mg by mouth daily.    Marland Kitchen levothyroxine (SYNTHROID) 50 MCG tablet Take 50 mcg by mouth daily.    Marland Kitchen  multivitamin (RENA-VIT) TABS tablet Take 1 tablet by mouth daily. 30 tablet 0  . pantoprazole (PROTONIX) 40 MG tablet Take 1 tablet (40 mg total) by mouth daily. 90 tablet 3  . sodium bicarbonate 650 MG tablet Take 650 mg by mouth 2 (two) times daily.    . traMADol (ULTRAM) 50 MG tablet Take 1 tablet (50 mg total) by mouth every 8 (eight) hours as needed for moderate pain. (Patient not taking: Reported on 10/02/2019) 8 tablet 0   No current facility-administered medications for this visit.    REVIEW OF SYSTEMS:  [X]  denotes positive finding, [ ]  denotes negative finding Cardiac  Comments:  Chest pain or chest pressure:    Shortness of breath upon exertion:    Short of breath when lying flat:    Irregular heart rhythm:        Vascular    Pain in calf, thigh, or hip brought on by ambulation:    Pain in feet at night that wakes you up from your sleep:     Blood clot in your veins:    Leg swelling:         Pulmonary    Oxygen at home:     Productive cough:     Wheezing:         Neurologic    Sudden weakness in arms or legs:     Sudden numbness in arms or legs:     Sudden onset of difficulty speaking or slurred speech:    Temporary loss of vision in one eye:     Problems with dizziness:         Gastrointestinal    Blood in stool:     Vomited blood:         Genitourinary    Burning when urinating:     Blood in urine:        Psychiatric    Major depression:         Hematologic    Bleeding problems:    Problems with blood clotting too easily:        Skin    Rashes or ulcers:        Constitutional    Fever or chills:     PHYSICAL EXAM:   Vitals:   12/25/19 0901  BP: (!) 160/87  Pulse: 86  Resp: 20  Temp: 98.3 F (36.8 C)  SpO2: 97%  Weight: 170 lb (77.1 kg)  Height: 6' (1.829 m)    GENERAL: The patient is a well-nourished male, in no acute distress. The vital signs are documented above. CARDIAC: There is a regular rate and rhythm.  VASCULAR: I do not detect carotid bruits. He has palpable femoral pulses.  He has a left brachiocephalic fistula which has an excellent thrill. PULMONARY: There is good air exchange bilaterally without wheezing or rales. ABDOMEN: Soft and non-tender with normal pitched bowel sounds.  MUSCULOSKELETAL: There are no major deformities or cyanosis. NEUROLOGIC: No focal weakness or paresthesias are detected. SKIN: There are no ulcers or rashes noted. PSYCHIATRIC: The patient has a normal affect.  DATA:    ARTERIAL DOPPLER STUDY: I have independently interpreted his arterial Doppler study today.  On the right side there is a triphasic dorsalis pedis and posterior tibial signal.  Great toe pressures 145 mmHg.  The arteries are not compressible so an ABI cannot be obtained.  On the left side there are triphasic dorsalis pedis and posterior tibial signals.  The arteries are not compressible.  Toe  pressures 149 mmHg.  DUPLEX ABDOMINAL AORTA: I have independently reviewed  the images of the duplex of his abdominal aorta.  By my measurement the maximum diameter of the aneurysm is 5.5 cm and has thus not changed significantly since his last study.  There is no evidence of endoleak.   Deitra Mayo [facility] [phone]

## 2019-12-25 NOTE — Procedures (Signed)
PROCEDURE SUMMARY:  Successful US guided paracentesis from right lateral abdomen.  Yielded 4.3 liters of clear yellow fluid.  No immediate complications.  Patient tolerated well.  EBL = trace  Vikrant Pryce S Sherrita Riederer PA-C 12/25/2019 3:04 PM

## 2020-01-08 ENCOUNTER — Other Ambulatory Visit (HOSPITAL_COMMUNITY): Payer: Self-pay | Admitting: Gastroenterology

## 2020-01-08 DIAGNOSIS — R188 Other ascites: Secondary | ICD-10-CM

## 2020-01-08 DIAGNOSIS — K7460 Unspecified cirrhosis of liver: Secondary | ICD-10-CM

## 2020-01-10 ENCOUNTER — Other Ambulatory Visit: Payer: Self-pay

## 2020-01-10 ENCOUNTER — Ambulatory Visit (HOSPITAL_COMMUNITY)
Admission: RE | Admit: 2020-01-10 | Discharge: 2020-01-10 | Disposition: A | Discharge status: Home / Self Care | Payer: Medicare Other | Source: Ambulatory Visit | Attending: Gastroenterology | Admitting: Gastroenterology

## 2020-01-10 DIAGNOSIS — R188 Other ascites: Secondary | ICD-10-CM | POA: Diagnosis not present

## 2020-01-10 DIAGNOSIS — K7460 Unspecified cirrhosis of liver: Secondary | ICD-10-CM | POA: Diagnosis not present

## 2020-01-10 HISTORY — PX: IR PARACENTESIS: IMG2679

## 2020-01-10 MED ORDER — LIDOCAINE HCL 1 % IJ SOLN
INTRAMUSCULAR | Status: AC
  Filled 2020-01-10: qty 20

## 2020-01-10 MED ORDER — LIDOCAINE HCL 1 % IJ SOLN
INTRAMUSCULAR | Status: DC | PRN
  Administered 2020-01-10: 10 mL

## 2020-01-16 ENCOUNTER — Other Ambulatory Visit: Payer: Self-pay | Admitting: Gastroenterology

## 2020-01-27 ENCOUNTER — Ambulatory Visit (HOSPITAL_COMMUNITY)
Admission: RE | Admit: 2020-01-27 | Discharge: 2020-01-27 | Disposition: A | Discharge status: Home / Self Care | Payer: Medicare Other | Source: Ambulatory Visit | Attending: Gastroenterology | Admitting: Gastroenterology

## 2020-01-27 ENCOUNTER — Other Ambulatory Visit: Payer: Self-pay

## 2020-01-27 DIAGNOSIS — K7460 Unspecified cirrhosis of liver: Secondary | ICD-10-CM | POA: Diagnosis present

## 2020-01-27 DIAGNOSIS — R188 Other ascites: Secondary | ICD-10-CM | POA: Diagnosis not present

## 2020-01-27 MED ORDER — LIDOCAINE HCL (PF) 1 % IJ SOLN
INTRAMUSCULAR | Status: AC | PRN
  Administered 2020-01-27: 10 mL

## 2020-01-27 MED ORDER — LIDOCAINE HCL 1 % IJ SOLN
INTRAMUSCULAR | Status: AC
  Filled 2020-01-27: qty 20

## 2020-01-27 NOTE — Procedures (Signed)
PROCEDURE SUMMARY:  Successful US guided paracentesis from right abdomen.  Yielded 3.2 L of clear yellow fluid.  No immediate complications.  Pt tolerated well.   EBL < 2 mL  Theresa Duty, NP 01/27/2020 10:58 AM

## 2020-02-05 ENCOUNTER — Emergency Department (HOSPITAL_COMMUNITY)
Admission: EM | Admit: 2020-02-05 | Discharge: 2020-02-05 | Disposition: A | Discharge status: Home / Self Care | Payer: Medicare Other | Attending: Emergency Medicine | Admitting: Emergency Medicine

## 2020-02-05 ENCOUNTER — Other Ambulatory Visit: Payer: Self-pay

## 2020-02-05 ENCOUNTER — Encounter (HOSPITAL_COMMUNITY): Payer: Self-pay | Admitting: Emergency Medicine

## 2020-02-05 DIAGNOSIS — R109 Unspecified abdominal pain: Secondary | ICD-10-CM | POA: Insufficient documentation

## 2020-02-05 DIAGNOSIS — Z5321 Procedure and treatment not carried out due to patient leaving prior to being seen by health care provider: Secondary | ICD-10-CM | POA: Insufficient documentation

## 2020-02-05 LAB — CBC
HCT: 40 % (ref 39.0–52.0)
Hemoglobin: 12.5 g/dL — ABNORMAL LOW (ref 13.0–17.0)
MCH: 31.6 pg (ref 26.0–34.0)
MCHC: 31.3 g/dL (ref 30.0–36.0)
MCV: 101 fL — ABNORMAL HIGH (ref 80.0–100.0)
Platelets: 120 10*3/uL — ABNORMAL LOW (ref 150–400)
RBC: 3.96 MIL/uL — ABNORMAL LOW (ref 4.22–5.81)
RDW: 17.8 % — ABNORMAL HIGH (ref 11.5–15.5)
WBC: 7.4 10*3/uL (ref 4.0–10.5)
nRBC: 0 % (ref 0.0–0.2)

## 2020-02-05 LAB — COMPREHENSIVE METABOLIC PANEL
ALT: 22 U/L (ref 0–44)
AST: 26 U/L (ref 15–41)
Albumin: 2.8 g/dL — ABNORMAL LOW (ref 3.5–5.0)
Alkaline Phosphatase: 168 U/L — ABNORMAL HIGH (ref 38–126)
Anion gap: 16 — ABNORMAL HIGH (ref 5–15)
BUN: 36 mg/dL — ABNORMAL HIGH (ref 8–23)
CO2: 25 mmol/L (ref 22–32)
Calcium: 8.4 mg/dL — ABNORMAL LOW (ref 8.9–10.3)
Chloride: 96 mmol/L — ABNORMAL LOW (ref 98–111)
Creatinine, Ser: 7.04 mg/dL — ABNORMAL HIGH (ref 0.61–1.24)
GFR, Estimated: 7 mL/min — ABNORMAL LOW (ref >60)
Glucose, Bld: 83 mg/dL (ref 70–99)
Potassium: 4.5 mmol/L (ref 3.5–5.1)
Sodium: 137 mmol/L (ref 135–145)
Total Bilirubin: 1.1 mg/dL (ref 0.3–1.2)
Total Protein: 6.2 g/dL — ABNORMAL LOW (ref 6.5–8.1)

## 2020-02-05 LAB — LIPASE, BLOOD: Lipase: 22 U/L (ref 11–51)

## 2020-02-05 NOTE — ED Notes (Signed)
No answer when called to update vs

## 2020-02-05 NOTE — ED Triage Notes (Signed)
Patient with history of ascites complains of abdominal pain and distention for the last several days. States he thinks he probably needs a paracentesis.

## 2020-02-05 NOTE — ED Notes (Signed)
Called pt x2 for vitals, no response. °

## 2020-02-12 ENCOUNTER — Other Ambulatory Visit: Payer: Self-pay

## 2020-02-12 ENCOUNTER — Other Ambulatory Visit (HOSPITAL_COMMUNITY)
Admission: RE | Admit: 2020-02-12 | Discharge: 2020-02-12 | Disposition: A | Discharge status: Home / Self Care | Payer: Medicare Other | Source: Ambulatory Visit | Attending: Gastroenterology | Admitting: Gastroenterology

## 2020-02-12 DIAGNOSIS — Z20822 Contact with and (suspected) exposure to covid-19: Secondary | ICD-10-CM | POA: Diagnosis not present

## 2020-02-12 DIAGNOSIS — Z01812 Encounter for preprocedural laboratory examination: Secondary | ICD-10-CM | POA: Insufficient documentation

## 2020-02-12 LAB — SARS CORONAVIRUS 2 (TAT 6-24 HRS): SARS Coronavirus 2: NEGATIVE

## 2020-02-13 NOTE — Anesthesia Preprocedure Evaluation (Addendum)
Anesthesia Evaluation  Patient identified by MRN, date of birth, ID band Patient awake    Reviewed: Allergy & Precautions, NPO status , Patient's Chart, lab work & pertinent test results  History of Anesthesia Complications Negative for: history of anesthetic complications  Airway Mallampati: II  TM Distance: >3 FB Neck ROM: Full    Dental no notable dental hx. (+) Dental Advisory Given   Pulmonary neg pulmonary ROS,    Pulmonary exam normal        Cardiovascular hypertension, Pt. on home beta blockers and Pt. on medications + CAD  Normal cardiovascular exam+ Valvular Problems/Murmurs AS      Neuro/Psych negative neurological ROS     GI/Hepatic negative GI ROS, Neg liver ROS,   Endo/Other  Hypothyroidism   Renal/GU ESRF and DialysisRenal disease     Musculoskeletal negative musculoskeletal ROS (+)   Abdominal   Peds  Hematology negative hematology ROS (+)   Anesthesia Other Findings   Reproductive/Obstetrics                            Anesthesia Physical Anesthesia Plan  ASA: III  Anesthesia Plan: MAC   Post-op Pain Management:    Induction: Intravenous  PONV Risk Score and Plan: Ondansetron and Propofol infusion  Airway Management Planned: Natural Airway and Simple Face Mask  Additional Equipment:   Intra-op Plan:   Post-operative Plan:   Informed Consent: I have reviewed the patients History and Physical, chart, labs and discussed the procedure including the risks, benefits and alternatives for the proposed anesthesia with the patient or authorized representative who has indicated his/her understanding and acceptance.     Dental advisory given  Plan Discussed with: Anesthesiologist and CRNA  Anesthesia Plan Comments:        Anesthesia Quick Evaluation

## 2020-02-14 ENCOUNTER — Other Ambulatory Visit: Payer: Self-pay

## 2020-02-14 ENCOUNTER — Ambulatory Visit (HOSPITAL_COMMUNITY): Payer: Medicare Other | Admitting: Anesthesiology

## 2020-02-14 ENCOUNTER — Ambulatory Visit (HOSPITAL_COMMUNITY)
Admission: RE | Admit: 2020-02-14 | Discharge: 2020-02-14 | Disposition: A | Discharge status: Home / Self Care | Payer: Medicare Other | Attending: Gastroenterology | Admitting: Gastroenterology

## 2020-02-14 ENCOUNTER — Encounter (HOSPITAL_COMMUNITY)
Admission: RE | Disposition: A | Discharge status: Home / Self Care | Payer: Self-pay | Source: Home / Self Care | Attending: Gastroenterology

## 2020-02-14 ENCOUNTER — Encounter (HOSPITAL_COMMUNITY): Payer: Self-pay | Admitting: Gastroenterology

## 2020-02-14 DIAGNOSIS — Z992 Dependence on renal dialysis: Secondary | ICD-10-CM | POA: Insufficient documentation

## 2020-02-14 DIAGNOSIS — K7460 Unspecified cirrhosis of liver: Secondary | ICD-10-CM | POA: Diagnosis not present

## 2020-02-14 DIAGNOSIS — Z881 Allergy status to other antibiotic agents status: Secondary | ICD-10-CM | POA: Insufficient documentation

## 2020-02-14 DIAGNOSIS — I120 Hypertensive chronic kidney disease with stage 5 chronic kidney disease or end stage renal disease: Secondary | ICD-10-CM | POA: Diagnosis not present

## 2020-02-14 DIAGNOSIS — I8510 Secondary esophageal varices without bleeding: Secondary | ICD-10-CM | POA: Diagnosis not present

## 2020-02-14 DIAGNOSIS — Z888 Allergy status to other drugs, medicaments and biological substances status: Secondary | ICD-10-CM | POA: Insufficient documentation

## 2020-02-14 DIAGNOSIS — N186 End stage renal disease: Secondary | ICD-10-CM | POA: Diagnosis not present

## 2020-02-14 DIAGNOSIS — Z79899 Other long term (current) drug therapy: Secondary | ICD-10-CM | POA: Diagnosis not present

## 2020-02-14 DIAGNOSIS — Z7982 Long term (current) use of aspirin: Secondary | ICD-10-CM | POA: Insufficient documentation

## 2020-02-14 DIAGNOSIS — Z79890 Hormone replacement therapy (postmenopausal): Secondary | ICD-10-CM | POA: Diagnosis not present

## 2020-02-14 DIAGNOSIS — Z980 Intestinal bypass and anastomosis status: Secondary | ICD-10-CM | POA: Insufficient documentation

## 2020-02-14 DIAGNOSIS — I8500 Esophageal varices without bleeding: Secondary | ICD-10-CM | POA: Diagnosis present

## 2020-02-14 DIAGNOSIS — K2950 Unspecified chronic gastritis without bleeding: Secondary | ICD-10-CM | POA: Insufficient documentation

## 2020-02-14 SURGERY — ESOPHAGOGASTRODUODENOSCOPY (EGD) WITH PROPOFOL
Anesthesia: Monitor Anesthesia Care

## 2020-02-14 MED ORDER — LACTATED RINGERS IV SOLN
INTRAVENOUS | Status: DC
Start: 2020-02-14 — End: 2020-02-14

## 2020-02-14 MED ORDER — LIDOCAINE 2% (20 MG/ML) 5 ML SYRINGE
INTRAMUSCULAR | Status: DC | PRN
  Administered 2020-02-14: 40 mg via INTRAVENOUS

## 2020-02-14 MED ORDER — ALBUMIN HUMAN 5 % IV SOLN
12.5000 g | Freq: Once | INTRAVENOUS | Status: AC
  Administered 2020-02-14: 12.5 g via INTRAVENOUS

## 2020-02-14 MED ORDER — SODIUM CHLORIDE 0.9 % IV SOLN
INTRAVENOUS | Status: DC
  Administered 2020-02-14: 500 mL via INTRAVENOUS

## 2020-02-14 MED ORDER — PROPOFOL 500 MG/50ML IV EMUL
INTRAVENOUS | Status: DC | PRN
  Administered 2020-02-14: 125 ug/kg/min via INTRAVENOUS

## 2020-02-14 MED ORDER — ONDANSETRON HCL 4 MG/2ML IJ SOLN
INTRAMUSCULAR | Status: DC | PRN
  Administered 2020-02-14: 4 mg via INTRAVENOUS

## 2020-02-14 MED ORDER — ALBUMIN HUMAN 5 % IV SOLN
INTRAVENOUS | Status: AC
  Filled 2020-02-14: qty 250

## 2020-02-14 MED ORDER — PHENYLEPHRINE 40 MCG/ML (10ML) SYRINGE FOR IV PUSH (FOR BLOOD PRESSURE SUPPORT)
PREFILLED_SYRINGE | INTRAVENOUS | Status: DC | PRN
  Administered 2020-02-14 (×2): 80 ug via INTRAVENOUS

## 2020-02-14 MED ORDER — ALBUMIN HUMAN 25 % IV SOLN: 12.5000 g | Freq: Once | INTRAVENOUS | Status: DC

## 2020-02-14 MED ORDER — PROPOFOL 10 MG/ML IV BOLUS
INTRAVENOUS | Status: DC | PRN
  Administered 2020-02-14 (×2): 40 mg via INTRAVENOUS

## 2020-02-14 SURGICAL SUPPLY — 14 items

## 2020-02-14 NOTE — Discharge Instructions (Signed)

## 2020-02-14 NOTE — Op Note (Addendum)
[facility] Patient Name: Ricky Harris Procedure Date: 02/14/2020 MRN: [id number] Attending MD: Otis Brace , MD Date of Birth: 1940/10/21 CSN: [id number] Age: 79 Admit Type: Outpatient Procedure:                Upper GI endoscopy Indications:              Follow-up of esophageal varices Providers:                Otis Brace, MD, Cleda Daub, RN, Elspeth Cho Tech., Technician, Glenis Smoker, CRNA Referring MD:              Medicines:                Sedation Administered by an Anesthesia Professional Complications:            No immediate complications. Estimated Blood Loss:     Estimated blood loss was minimal. Procedure:                Pre-Anesthesia Assessment:                           - Prior to the procedure, a History and Physical                            was performed, and patient medications and                            allergies were reviewed. The patient's tolerance of                            previous anesthesia was also reviewed. The risks                            and benefits of the procedure and the sedation                            options and risks were discussed with the patient.                            All questions were answered, and informed consent                            was obtained. Prior Anticoagulants: The patient has                            taken no previous anticoagulant or antiplatelet                            agents. ASA Grade Assessment: III - A patient with                            severe systemic disease. After reviewing the risks  and benefits, the patient was deemed in                            satisfactory condition to undergo the procedure.                           After obtaining informed consent, the endoscope was                            passed under direct vision. Throughout the                            procedure, the patient's  blood pressure, pulse, and                            oxygen saturations were monitored continuously. The                            GIF-H190 ([id number]) Olympus gastroscope was                            introduced through the mouth, and advanced to the                            second part of duodenum. The upper GI endoscopy was                            accomplished without difficulty. The patient                            tolerated the procedure well. Scope In: Scope Out: Findings:      Two columns of small (< 5 mm) varices with no bleeding and no stigmata       of recent bleeding were found in the middle third of the esophagus and       in the lower third of the esophagus,. No red wale signs were present.       Scarring from prior treatment was visible.      Diffuse moderate inflammation characterized by congestion (edema),       erosions and erythema was found in the entire examined stomach.      The cardia and gastric fundus were normal on retroflexion.      Evidence of a previous surgical anastomosis was found in the gastric       antrum. This was characterized by healthy appearing mucosa.      The examined duodenum was normal. Impression:               - Small (< 5 mm) esophageal varices with no                            bleeding and no stigmata of recent bleeding.                           - Chronic gastritis.                           -  A previous surgical anastomosis was found,                            characterized by healthy appearing mucosa.                           - Normal examined duodenum.                           - No specimens collected. Moderate Sedation:      Moderate (conscious) sedation was personally administered by an       anesthesia professional. The following parameters were monitored: oxygen       saturation, heart rate, blood pressure, and response to care. Recommendation:           - Patient has a contact number available for                             emergencies. The signs and symptoms of potential                            delayed complications were discussed with the                            patient. Return to normal activities tomorrow.                            Written discharge instructions were provided to the                            patient.                           - Resume previous diet.                           - Continue present medications.                           - Repeat upper endoscopy in 2 years for                            surveillance.                           - Return to my office as previously scheduled. Procedure Code(s):        --- Professional ---                           [phone], Esophagogastroduodenoscopy, flexible,                            transoral; diagnostic, including collection of                            specimen(s) by brushing or washing, when performed                            (  separate procedure) Diagnosis Code(s):        --- Professional ---                           I85.00, Esophageal varices without bleeding                           K29.50, Unspecified chronic gastritis without                            bleeding                           Z98.0, Intestinal bypass and anastomosis status CPT copyright 2019 [employer]. All rights reserved. The codes documented in this report are preliminary and upon coder review may  be revised to meet current compliance requirements. Otis Brace, MD Otis Brace, MD 02/14/2020 9:15:42 AM Number of Addenda: 0

## 2020-02-14 NOTE — Anesthesia Postprocedure Evaluation (Signed)
Anesthesia Post Note  Patient: GEOVANNY SARTIN  Procedure(s) Performed: ESOPHAGOGASTRODUODENOSCOPY (EGD) WITH PROPOFOL (N/A )     Patient location during evaluation: Endoscopy Anesthesia Type: MAC Level of consciousness: awake and alert Pain management: pain level controlled Vital Signs Assessment: post-procedure vital signs reviewed and stable Respiratory status: spontaneous breathing, nonlabored ventilation, respiratory function stable and patient connected to nasal cannula oxygen Cardiovascular status: blood pressure returned to baseline and stable Postop Assessment: no apparent nausea or vomiting Anesthetic complications: no   No complications documented.  Last Vitals:  Vitals:   02/14/20 0940 02/14/20 0950  BP: (!) 87/41 (!) 102/37  Pulse: (!) 56 (!) 55  Resp: 18 18  Temp:    SpO2: 95% 97%    Last Pain:  Vitals:   02/14/20 0950  TempSrc:   PainSc: 0-No pain                 Morissa Obeirne DANIEL

## 2020-02-14 NOTE — H&P (Addendum)
   Ricky Harris is a 79 y.o. male has presented today for esophageal variceal surveillance. 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 Last EGD in August 2020 showed small esophageal varices.    The various methods of treatment have been discussed with the patient and family. After consideration of risks, benefits and other options for treatment, the patient has consented to  Procedure(s): Esophagoduodenoscopy as a surgical intervention .  The patient's history has been reviewed, patient examined, no change in status, stable for surgery.  I have reviewed the patient's chart and labs.  Questions were answered to the patient's satisfaction.   Risks (bleeding, infection, bowel perforation that could require surgery, sedation-related changes in cardiopulmonary systems), benefits (identification and possible treatment of source of symptoms, exclusion of certain causes of symptoms), and alternatives (watchful waiting, radiographic imaging studies, empiric medical treatment)  were explained to patient in detail and patient wishes to proceed.  Otis Brace MD, [place] 02/14/2020, 8:42 AM  Contact #  [phone]

## 2020-02-14 NOTE — Transfer of Care (Signed)
Immediate Anesthesia Transfer of Care Note  Patient: Ricky Harris  Procedure(s) Performed: ESOPHAGOGASTRODUODENOSCOPY (EGD) WITH PROPOFOL (N/A )  Patient Location: PACU and Endoscopy Unit  Anesthesia Type:MAC  Level of Consciousness: sedated  Airway & Oxygen Therapy: Patient Spontanous Breathing and Patient connected to face mask oxygen  Post-op Assessment: Report given to RN and Post -op Vital signs reviewed and stable  Post vital signs: Reviewed and stable  Last Vitals:  Vitals Value Taken Time  BP 98/27 02/14/20 0913  Temp    Pulse 59 02/14/20 0914  Resp 17 02/14/20 0914  SpO2 96 % 02/14/20 0914  Vitals shown include unvalidated device data.  Last Pain:  Vitals:   02/14/20 0822  TempSrc: Oral  PainSc: 0-No pain         Complications: No complications documented.

## 2020-02-14 NOTE — Anesthesia Procedure Notes (Signed)
Procedure Name: MAC Date/Time: 02/14/2020 8:52 AM Performed by: Cynda Familia, CRNA Pre-anesthesia Checklist: Patient identified, Emergency Drugs available, Suction available, Patient being monitored and Timeout performed Patient Re-evaluated:Patient Re-evaluated prior to induction Oxygen Delivery Method: Simple face mask Placement Confirmation: positive ETCO2 and breath sounds checked- equal and bilateral Dental Injury: Teeth and Oropharynx as per pre-operative assessment

## 2020-02-15 LAB — POCT I-STAT, CHEM 8
BUN: 40 mg/dL — ABNORMAL HIGH (ref 8–23)
Calcium, Ion: 1.14 mmol/L — ABNORMAL LOW (ref 1.15–1.40)
Chloride: 99 mmol/L (ref 98–111)
Creatinine, Ser: 5.3 mg/dL — ABNORMAL HIGH (ref 0.61–1.24)
Glucose, Bld: 91 mg/dL (ref 70–99)
HCT: 37 % — ABNORMAL LOW (ref 39.0–52.0)
Hemoglobin: 12.6 g/dL — ABNORMAL LOW (ref 13.0–17.0)
Potassium: 3.9 mmol/L (ref 3.5–5.1)
Sodium: 138 mmol/L (ref 135–145)
TCO2: 25 mmol/L (ref 22–32)

## 2020-02-16 ENCOUNTER — Encounter (HOSPITAL_COMMUNITY): Payer: Self-pay | Admitting: Gastroenterology

## 2020-02-18 ENCOUNTER — Inpatient Hospital Stay (HOSPITAL_COMMUNITY)
Admission: EM | Admit: 2020-02-18 | Discharge: 2020-02-20 | DRG: 640 | Disposition: A | Discharge status: Home / Self Care | Payer: Medicare Other | Attending: Internal Medicine | Admitting: Internal Medicine

## 2020-02-18 ENCOUNTER — Other Ambulatory Visit: Payer: Self-pay

## 2020-02-18 ENCOUNTER — Emergency Department (HOSPITAL_COMMUNITY): Payer: Medicare Other

## 2020-02-18 ENCOUNTER — Encounter (HOSPITAL_COMMUNITY): Payer: Self-pay

## 2020-02-18 DIAGNOSIS — K2950 Unspecified chronic gastritis without bleeding: Secondary | ICD-10-CM | POA: Diagnosis present

## 2020-02-18 DIAGNOSIS — K219 Gastro-esophageal reflux disease without esophagitis: Secondary | ICD-10-CM | POA: Diagnosis present

## 2020-02-18 DIAGNOSIS — R0600 Dyspnea, unspecified: Secondary | ICD-10-CM | POA: Diagnosis not present

## 2020-02-18 DIAGNOSIS — Z8546 Personal history of malignant neoplasm of prostate: Secondary | ICD-10-CM

## 2020-02-18 DIAGNOSIS — Z992 Dependence on renal dialysis: Secondary | ICD-10-CM | POA: Diagnosis not present

## 2020-02-18 DIAGNOSIS — R188 Other ascites: Secondary | ICD-10-CM | POA: Diagnosis present

## 2020-02-18 DIAGNOSIS — Z79890 Hormone replacement therapy (postmenopausal): Secondary | ICD-10-CM

## 2020-02-18 DIAGNOSIS — I701 Atherosclerosis of renal artery: Secondary | ICD-10-CM | POA: Diagnosis present

## 2020-02-18 DIAGNOSIS — J9811 Atelectasis: Secondary | ICD-10-CM | POA: Diagnosis present

## 2020-02-18 DIAGNOSIS — K5900 Constipation, unspecified: Secondary | ICD-10-CM | POA: Diagnosis present

## 2020-02-18 DIAGNOSIS — I10 Essential (primary) hypertension: Secondary | ICD-10-CM | POA: Diagnosis present

## 2020-02-18 DIAGNOSIS — E039 Hypothyroidism, unspecified: Secondary | ICD-10-CM | POA: Diagnosis present

## 2020-02-18 DIAGNOSIS — I120 Hypertensive chronic kidney disease with stage 5 chronic kidney disease or end stage renal disease: Secondary | ICD-10-CM | POA: Diagnosis present

## 2020-02-18 DIAGNOSIS — E7800 Pure hypercholesterolemia, unspecified: Secondary | ICD-10-CM | POA: Diagnosis present

## 2020-02-18 DIAGNOSIS — Z79899 Other long term (current) drug therapy: Secondary | ICD-10-CM

## 2020-02-18 DIAGNOSIS — N2581 Secondary hyperparathyroidism of renal origin: Secondary | ICD-10-CM | POA: Diagnosis present

## 2020-02-18 DIAGNOSIS — E785 Hyperlipidemia, unspecified: Secondary | ICD-10-CM | POA: Diagnosis present

## 2020-02-18 DIAGNOSIS — I714 Abdominal aortic aneurysm, without rupture, unspecified: Secondary | ICD-10-CM | POA: Diagnosis present

## 2020-02-18 DIAGNOSIS — Z9181 History of falling: Secondary | ICD-10-CM

## 2020-02-18 DIAGNOSIS — R0602 Shortness of breath: Secondary | ICD-10-CM

## 2020-02-18 DIAGNOSIS — Z961 Presence of intraocular lens: Secondary | ICD-10-CM | POA: Diagnosis present

## 2020-02-18 DIAGNOSIS — Z888 Allergy status to other drugs, medicaments and biological substances status: Secondary | ICD-10-CM | POA: Diagnosis not present

## 2020-02-18 DIAGNOSIS — K7460 Unspecified cirrhosis of liver: Secondary | ICD-10-CM | POA: Diagnosis present

## 2020-02-18 DIAGNOSIS — N186 End stage renal disease: Secondary | ICD-10-CM | POA: Diagnosis present

## 2020-02-18 DIAGNOSIS — D631 Anemia in chronic kidney disease: Secondary | ICD-10-CM | POA: Diagnosis present

## 2020-02-18 DIAGNOSIS — I350 Nonrheumatic aortic (valve) stenosis: Secondary | ICD-10-CM | POA: Diagnosis present

## 2020-02-18 DIAGNOSIS — R011 Cardiac murmur, unspecified: Secondary | ICD-10-CM | POA: Diagnosis present

## 2020-02-18 DIAGNOSIS — E8770 Fluid overload, unspecified: Principal | ICD-10-CM | POA: Diagnosis present

## 2020-02-18 DIAGNOSIS — I8500 Esophageal varices without bleeding: Secondary | ICD-10-CM | POA: Diagnosis present

## 2020-02-18 DIAGNOSIS — E8889 Other specified metabolic disorders: Secondary | ICD-10-CM | POA: Diagnosis present

## 2020-02-18 DIAGNOSIS — Z20822 Contact with and (suspected) exposure to covid-19: Secondary | ICD-10-CM | POA: Diagnosis present

## 2020-02-18 DIAGNOSIS — K7581 Nonalcoholic steatohepatitis (NASH): Secondary | ICD-10-CM | POA: Diagnosis present

## 2020-02-18 DIAGNOSIS — Z9049 Acquired absence of other specified parts of digestive tract: Secondary | ICD-10-CM | POA: Diagnosis not present

## 2020-02-18 DIAGNOSIS — M109 Gout, unspecified: Secondary | ICD-10-CM | POA: Diagnosis present

## 2020-02-18 DIAGNOSIS — R1084 Generalized abdominal pain: Secondary | ICD-10-CM

## 2020-02-18 DIAGNOSIS — Z9842 Cataract extraction status, left eye: Secondary | ICD-10-CM

## 2020-02-18 DIAGNOSIS — I2510 Atherosclerotic heart disease of native coronary artery without angina pectoris: Secondary | ICD-10-CM | POA: Diagnosis present

## 2020-02-18 DIAGNOSIS — D696 Thrombocytopenia, unspecified: Secondary | ICD-10-CM | POA: Diagnosis present

## 2020-02-18 DIAGNOSIS — Z9841 Cataract extraction status, right eye: Secondary | ICD-10-CM

## 2020-02-18 DIAGNOSIS — Z823 Family history of stroke: Secondary | ICD-10-CM

## 2020-02-18 DIAGNOSIS — Z833 Family history of diabetes mellitus: Secondary | ICD-10-CM

## 2020-02-18 LAB — HEPATIC FUNCTION PANEL
ALT: 58 U/L — ABNORMAL HIGH (ref 0–44)
AST: 85 U/L — ABNORMAL HIGH (ref 15–41)
Albumin: 3.3 g/dL — ABNORMAL LOW (ref 3.5–5.0)
Alkaline Phosphatase: 248 U/L — ABNORMAL HIGH (ref 38–126)
Bilirubin, Direct: 0.3 mg/dL — ABNORMAL HIGH (ref 0.0–0.2)
Indirect Bilirubin: 0.5 mg/dL (ref 0.3–0.9)
Total Bilirubin: 0.8 mg/dL (ref 0.3–1.2)
Total Protein: 6.8 g/dL (ref 6.5–8.1)

## 2020-02-18 LAB — LACTIC ACID, PLASMA
Lactic Acid, Venous: 0.8 mmol/L (ref 0.5–1.9)
Lactic Acid, Venous: 0.7 mmol/L (ref 0.5–1.9)

## 2020-02-18 LAB — BASIC METABOLIC PANEL
Anion gap: 13 (ref 5–15)
BUN: 96 mg/dL — ABNORMAL HIGH (ref 8–23)
CO2: 25 mmol/L (ref 22–32)
Calcium: 8.6 mg/dL — ABNORMAL LOW (ref 8.9–10.3)
Chloride: 97 mmol/L — ABNORMAL LOW (ref 98–111)
Creatinine, Ser: 8.02 mg/dL — ABNORMAL HIGH (ref 0.61–1.24)
GFR, Estimated: 6 mL/min — ABNORMAL LOW (ref >60)
Glucose, Bld: 96 mg/dL (ref 70–99)
Potassium: 4.1 mmol/L (ref 3.5–5.1)
Sodium: 135 mmol/L (ref 135–145)

## 2020-02-18 LAB — RESP PANEL BY RT-PCR (FLU A&B, COVID) ARPGX2
Influenza A by PCR: NEGATIVE
Influenza B by PCR: NEGATIVE
SARS Coronavirus 2 by RT PCR: NEGATIVE

## 2020-02-18 LAB — CBC
HCT: 37.2 % — ABNORMAL LOW (ref 39.0–52.0)
Hemoglobin: 12.2 g/dL — ABNORMAL LOW (ref 13.0–17.0)
MCH: 32.8 pg (ref 26.0–34.0)
MCHC: 32.8 g/dL (ref 30.0–36.0)
MCV: 100 fL (ref 80.0–100.0)
Platelets: 108 10*3/uL — ABNORMAL LOW (ref 150–400)
RBC: 3.72 MIL/uL — ABNORMAL LOW (ref 4.22–5.81)
RDW: 16.6 % — ABNORMAL HIGH (ref 11.5–15.5)
WBC: 6.6 10*3/uL (ref 4.0–10.5)
nRBC: 0 % (ref 0.0–0.2)

## 2020-02-18 LAB — LIPASE, BLOOD: Lipase: 46 U/L (ref 11–51)

## 2020-02-18 MED ORDER — CAMPHOR-MENTHOL 0.5-0.5 % EX LOTN: 1.0000 "application " | TOPICAL_LOTION | Freq: Three times a day (TID) | CUTANEOUS | Status: DC | PRN

## 2020-02-18 MED ORDER — CALCIUM CARBONATE ANTACID 1250 MG/5ML PO SUSP
500.0000 mg | Freq: Four times a day (QID) | ORAL | Status: DC | PRN
  Filled 2020-02-18: qty 5

## 2020-02-18 MED ORDER — DOCUSATE SODIUM 283 MG RE ENEM
1.0000 | ENEMA | RECTAL | Status: DC | PRN
  Filled 2020-02-18: qty 1

## 2020-02-18 MED ORDER — ZOLPIDEM TARTRATE 5 MG PO TABS
5.0000 mg | ORAL_TABLET | Freq: Every evening | ORAL | Status: DC | PRN
Start: 2020-02-18 — End: 2020-02-20

## 2020-02-18 MED ORDER — ONDANSETRON HCL 4 MG/2ML IJ SOLN: 4.0000 mg | Freq: Four times a day (QID) | INTRAMUSCULAR | Status: DC | PRN

## 2020-02-18 MED ORDER — ONDANSETRON HCL 4 MG PO TABS
4.0000 mg | ORAL_TABLET | Freq: Four times a day (QID) | ORAL | Status: DC | PRN
  Filled 2020-02-18: qty 1

## 2020-02-18 MED ORDER — ACETAMINOPHEN 650 MG RE SUPP: 650.0000 mg | Freq: Four times a day (QID) | RECTAL | Status: DC | PRN

## 2020-02-18 MED ORDER — SODIUM CHLORIDE 0.9% FLUSH
3.0000 mL | Freq: Two times a day (BID) | INTRAVENOUS | Status: DC
  Administered 2020-02-19: 3 mL via INTRAVENOUS

## 2020-02-18 MED ORDER — HYDROXYZINE HCL 25 MG PO TABS: 25.0000 mg | ORAL_TABLET | Freq: Three times a day (TID) | ORAL | Status: DC | PRN

## 2020-02-18 MED ORDER — SODIUM CHLORIDE 0.9% FLUSH: 3.0000 mL | INTRAVENOUS | Status: DC | PRN

## 2020-02-18 MED ORDER — SORBITOL 70 % SOLN
30.0000 mL | Status: DC | PRN
  Filled 2020-02-18: qty 30

## 2020-02-18 MED ORDER — ACETAMINOPHEN 325 MG PO TABS: 650.0000 mg | ORAL_TABLET | Freq: Four times a day (QID) | ORAL | Status: DC | PRN

## 2020-02-18 MED ORDER — NEPRO/CARBSTEADY PO LIQD
237.0000 mL | Freq: Three times a day (TID) | ORAL | Status: DC | PRN
Start: 2020-02-18 — End: 2020-02-19

## 2020-02-18 MED ORDER — IOHEXOL 9 MG/ML PO SOLN
ORAL | Status: AC
  Filled 2020-02-18: qty 1000

## 2020-02-18 MED ORDER — SODIUM CHLORIDE 0.9 % IV SOLN: 250.0000 mL | INTRAVENOUS | Status: DC | PRN

## 2020-02-18 NOTE — ED Provider Notes (Signed)
Patient seen after prior ED provider.    Patient with evidence of fluid overload in the setting of recent missed dialysis.  Patient also with evidence of significant ascites.  Missed dialysis and accumulation of ascites are most likely contributing to the patient's dyspnea and abdominal discomfort.  No clear evidence of infectious process on exam or initial ED work-up.  Discussed with hospitalist service -Dr. Jonelle Sidle - who will evaluate patient.  We will plan to admit for further inpatient work-up and treatment including likely inpatient dialysis and possible IR LV paracentesis.  Patient understands plan of care and is in agreement.    Valarie Merino, MD 02/18/20 2014

## 2020-02-18 NOTE — ED Notes (Signed)
Pt maintain SpO2 of 100% while ambulating

## 2020-02-18 NOTE — ED Provider Notes (Signed)
[facility] DEPT Provider Note   CSN: [id number] Arrival date & time: 02/18/20  1119     History Chief Complaint  Patient presents with  . Shortness of Breath  . Weakness  . Abdominal Pain    Ricky Harris is a 79 y.o. male.  The history is provided by the patient and medical records. No language interpreter was used.  Shortness of Breath Severity:  Moderate Onset quality:  Gradual Duration:  2 days Timing:  Constant Progression:  Unchanged Chronicity:  New Context: URI (cough)   Relieved by:  Nothing Worsened by:  Exertion, coughing and deep breathing Ineffective treatments:  None tried Associated symptoms: abdominal pain and cough   Associated symptoms: no chest pain, no diaphoresis, no fever, no headaches, no neck pain, no rash, no sputum production, no vomiting and no wheezing   Risk factors: hx of cancer   Risk factors: no hx of PE/DVT        Past Medical History:  Diagnosis Date  . AAA (abdominal aortic aneurysm) ([place])    5.3cm , magd by vascular Dr Scot Dock ,  . Anemia    " slightly"  . Aortic stenosis   . Arthritis    knees  . Ascites   . Cancer [facility])    prostate  . Chronic kidney disease    stage 4, mgd by Dr Justin Mend   . 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([place])   . Coronary artery disease    on CT scan  . Gout   . H/O hypercholesterolemia   . Heart murmur   . History of falling    recent- see ct chest results of 04/25/2018  . Hypertension   . Hypothyroidism   . Liver cirrhosis ([place])   . Prediabetes    pt denies   . Renal artery stenosis ([place])   . Thrombocytopenia ([place])   . Wears glasses   . Wears glasses     Patient Active Problem List   Diagnosis Date Noted  . Hyperkalemia 09/23/2019  . CKD (chronic kidney disease), stage V ([place]) 09/23/2019  . Acute kidney insufficiency 09/23/2019  . Abdominal aortic aneurysm (AAA) ([place]) 01/21/2019  . AAA (abdominal aortic aneurysm) without rupture ([place]) 01/21/2019   . Ascites 05/20/2017  . Normochromic normocytic anemia 05/20/2017  . Essential hypertension 05/20/2017  . CKD (chronic kidney disease) stage 3, GFR 30-59 ml/min ([facility]) 05/20/2017  . HLD (hyperlipidemia) 05/20/2017  . Hypothyroidism 05/20/2017    Past Surgical History:  Procedure Laterality Date  . ABDOMINAL AORTIC ENDOVASCULAR STENT GRAFT N/A 01/21/2019   Procedure: ABDOMINAL AORTIC ENDOVASCULAR STENT GRAFT WITH CO2;  Surgeon: Angelia Mould, MD;  Location: [facility];  Service: Vascular;  Laterality: N/A;  . ABDOMINAL AORTOGRAM W/LOWER EXTREMITY Bilateral 12/14/2018   Procedure: ABDOMINAL AORTOGRAM W/LOWER EXTREMITY;  Surgeon: Angelia Mould, MD;  Location: [facility];  Service: Cardiovascular;  Laterality: Bilateral;  . AV FISTULA PLACEMENT Left 03/12/2019   Procedure: LEFT ARTERIOVENOUS Arteriovenous FISTULA CREATION.;  Surgeon: Angelia Mould, MD;  Location: [facility];  Service: Vascular;  Laterality: Left;  . CATARACT EXTRACTION W/ INTRAOCULAR LENS  IMPLANT, BILATERAL    . CHOLECYSTECTOMY    . ESOPHAGEAL BANDING N/A 08/11/2017   Procedure: ESOPHAGEAL BANDING;  Surgeon: Otis Brace, MD;  Location: [facility];  Service: Gastroenterology;  Laterality: N/A;  . ESOPHAGEAL BANDING N/A 10/26/2017   Procedure: ESOPHAGEAL BANDING;  Surgeon: Otis Brace, MD;  Location: [facility];  Service: Gastroenterology;  Laterality:  N/A;  . ESOPHAGEAL BANDING N/A 12/19/2017   Procedure: ESOPHAGEAL BANDING;  Surgeon: Otis Brace, MD;  Location: [facility];  Service: Gastroenterology;  Laterality: N/A;  . ESOPHAGOGASTRODUODENOSCOPY (EGD) WITH PROPOFOL N/A 07/05/2017   Procedure: ESOPHAGOGASTRODUODENOSCOPY (EGD) WITH PROPOFOL;  Surgeon: Otis Brace, MD;  [facility];  Service: Gastroenterology;  Laterality: N/A;  . ESOPHAGOGASTRODUODENOSCOPY (EGD) WITH PROPOFOL N/A 08/11/2017   Procedure: ESOPHAGOGASTRODUODENOSCOPY (EGD) WITH PROPOFOL;  Surgeon:  Otis Brace, MD;  Location: [facility];  Service: Gastroenterology;  Laterality: N/A;  . ESOPHAGOGASTRODUODENOSCOPY (EGD) WITH PROPOFOL N/A 10/26/2017   Procedure: ESOPHAGOGASTRODUODENOSCOPY (EGD) WITH PROPOFOL;  Surgeon: Otis Brace, MD;  [facility];  Service: Gastroenterology;  Laterality: N/A;  . ESOPHAGOGASTRODUODENOSCOPY (EGD) WITH PROPOFOL N/A 12/19/2017   Procedure: ESOPHAGOGASTRODUODENOSCOPY (EGD) WITH PROPOFOL;  Surgeon: Otis Brace, MD;  [facility];  Service: Gastroenterology;  Laterality: N/A;  . ESOPHAGOGASTRODUODENOSCOPY (EGD) WITH PROPOFOL N/A 10/29/2018   Procedure: ESOPHAGOGASTRODUODENOSCOPY (EGD) WITH PROPOFOL;  Surgeon: Otis Brace, MD;  [facility];  Service: Gastroenterology;  Laterality: N/A;  . ESOPHAGOGASTRODUODENOSCOPY (EGD) WITH PROPOFOL N/A 02/14/2020   Procedure: ESOPHAGOGASTRODUODENOSCOPY (EGD) WITH PROPOFOL;  Surgeon: Otis Brace, MD;  [facility];  Service: Gastroenterology;  Laterality: N/A;  . EYE SURGERY     bilateral cataract removal with lens placement  . HERNIA REPAIR    . IR PARACENTESIS  12/26/2018  . IR PARACENTESIS  01/02/2019  . IR PARACENTESIS  01/15/2019  . IR PARACENTESIS  01/30/2019  . IR PARACENTESIS  02/05/2019  . IR PARACENTESIS  02/14/2019  . IR PARACENTESIS  02/28/2019  . IR PARACENTESIS  03/14/2019  . IR PARACENTESIS  03/29/2019  . IR PARACENTESIS  04/11/2019  . IR PARACENTESIS  04/25/2019  . IR PARACENTESIS  05/09/2019  . IR PARACENTESIS  06/03/2019  . IR PARACENTESIS  06/06/2019  . IR PARACENTESIS  06/20/2019  . IR PARACENTESIS  07/03/2019  . IR PARACENTESIS  07/19/2019  . IR PARACENTESIS  08/08/2019  . IR PARACENTESIS  08/27/2019  . IR PARACENTESIS  09/09/2019  . IR PARACENTESIS  09/24/2019  . IR PARACENTESIS  10/07/2019  . IR PARACENTESIS  10/23/2019  . IR PARACENTESIS  11/07/2019  . IR PARACENTESIS  11/21/2019  . IR PARACENTESIS  12/04/2019  . IR PARACENTESIS  12/25/2019  .  IR PARACENTESIS  01/10/2020  . IR PARACENTESIS  01/27/2020  . PROSTATECTOMY    . subtotal gastrectomy         Family History  Problem Relation Age of Onset  . Diabetes Mellitus II Mother   . Stroke Mother     Social History   Tobacco Use  . Smoking status: Never Smoker  . Smokeless tobacco: Never Used  Vaping Use  . Vaping Use: Never used  Substance Use Topics  . Alcohol use: Not Currently  . Drug use: Never    Home Medications Prior to Admission medications   Medication Sig Start Date End Date Taking? Authorizing Provider  atorvastatin (LIPITOR) 10 MG tablet Take 10 mg by mouth at bedtime.  05/06/17   [provider]  carvedilol (COREG) 12.5 MG tablet Take 12.5 mg by mouth 2 (two) times daily. 03/22/17   [provider]  cloNIDine (CATAPRES) 0.1 MG tablet Take 1 tablet (0.1 mg total) by mouth daily. Patient taking differently: Take 0.2 mg by mouth daily. 09/28/19   Autry-Lott, Naaman Plummer, DO  colchicine 0.6 MG tablet Take 0.6 mg by mouth daily as needed (gout flare).  03/22/17   [provider]  febuxostat (  ULORIC) 40 MG tablet Take 40 mg by mouth daily.    [provider]  lactulose (CHRONULAC) 10 GM/15ML solution Take 10 mLs by mouth daily. 01/15/20   [provider]  levothyroxine (SYNTHROID) 25 MCG tablet Take 25 mcg by mouth daily. 07/20/19   [provider]  multivitamin (RENA-VIT) TABS tablet Take 1 tablet by mouth daily. Patient not taking: No sig reported 09/27/19   Autry-Lott, Naaman Plummer, DO  sodium bicarbonate 650 MG tablet Take 650 mg by mouth 2 (two) times daily.    [provider]  traMADol (ULTRAM) 50 MG tablet Take 1 tablet (50 mg total) by mouth every 8 (eight) hours as needed for moderate pain. 03/12/19   Rhyne, Hulen Shouts, PA-C    Allergies    Cephalexin and Hydralazine  Review of Systems   Review of Systems  Constitutional: Positive for fatigue. Negative for chills, diaphoresis and fever.  HENT:  Negative for congestion.   Respiratory: Positive for cough, chest tightness and shortness of breath. Negative for sputum production, wheezing and stridor.   Cardiovascular: Negative for chest pain and palpitations.  Gastrointestinal: Positive for abdominal pain. Negative for constipation, diarrhea, nausea and vomiting.  Genitourinary: Negative for flank pain and frequency.  Musculoskeletal: Negative for back pain, neck pain and neck stiffness.  Skin: Negative for rash and wound.  Neurological: Negative for dizziness, weakness, light-headedness and headaches.  Psychiatric/Behavioral: Negative for agitation and confusion.  All other systems reviewed and are negative.   Physical Exam Updated Vital Signs BP (!) 163/79 (BP Location: Right Arm) Comment: hasnt took BP meds today  Pulse 66   Temp 98.3 F (36.8 C)   Resp 17   Ht 6' (1.829 m)   Wt 72.6 kg   SpO2 100%   BMI 21.70 kg/m   Physical Exam Vitals and nursing note reviewed.  Constitutional:      General: He is not in acute distress.    Appearance: He is well-developed and well-nourished. He is not ill-appearing, toxic-appearing or diaphoretic.  HENT:     Head: Normocephalic and atraumatic.  Eyes:     Conjunctiva/sclera: Conjunctivae normal.     Pupils: Pupils are equal, round, and reactive to light.  Cardiovascular:     Rate and Rhythm: Normal rate and regular rhythm.     Heart sounds: No murmur heard.   Pulmonary:     Effort: Pulmonary effort is normal. Tachypnea present. No respiratory distress.     Breath sounds: Rales present. No decreased breath sounds, wheezing or rhonchi.  Chest:     Chest wall: No tenderness.  Abdominal:     General: Bowel sounds are normal.     Palpations: Abdomen is soft.     Tenderness: There is abdominal tenderness. There is no guarding or rebound.  Musculoskeletal:        General: No edema.     Cervical back: Neck supple.     Right lower leg: No tenderness. No edema.     Left lower  leg: No tenderness. No edema.  Skin:    General: Skin is warm and dry.     Capillary Refill: Capillary refill takes less than 2 seconds.     Findings: No erythema.  Neurological:     General: No focal deficit present.     Mental Status: He is alert.  Psychiatric:        Mood and Affect: Mood and affect and mood normal.     ED Results / Procedures / Treatments  Labs (all labs ordered are listed, but only abnormal results are displayed) Labs Reviewed  BASIC METABOLIC PANEL - Abnormal; Notable for the following components:      Result Value   Chloride 97 (*)    BUN 96 (*)    Creatinine, Ser 8.02 (*)    Calcium 8.6 (*)    GFR, Estimated 6 (*)    All other components within normal limits  CBC - Abnormal; Notable for the following components:   RBC 3.72 (*)    Hemoglobin 12.2 (*)    HCT 37.2 (*)    RDW 16.6 (*)    Platelets 108 (*)    All other components within normal limits  HEPATIC FUNCTION PANEL - Abnormal; Notable for the following components:   Albumin 3.3 (*)    AST 85 (*)    ALT 58 (*)    Alkaline Phosphatase 248 (*)    Bilirubin, Direct 0.3 (*)    All other components within normal limits  RESP PANEL BY RT-PCR (FLU A&B, COVID) ARPGX2  LIPASE, BLOOD  LACTIC ACID, PLASMA  LACTIC ACID, PLASMA    EKG EKG Interpretation  Date/Time:  Tuesday February 18 2020 13:00:19 EST Ventricular Rate:  62 PR Interval:    QRS Duration: 106 QT Interval:  441 QTC Calculation: 448 R Axis:   7 Text Interpretation: Sinus rhythm Probable anteroseptal infarct, old When comapred to prior, similar appearance. No STEMI Confirmed by Antony Blackbird [phone]) on 02/18/2020 1:15:18 PM   Radiology DG Chest Portable 1 View  Result Date: 02/18/2020 CLINICAL DATA:  This dialysis, shortness of breath and nonproductive cough and weakness EXAM: PORTABLE CHEST 1 VIEW COMPARISON:  October 20th of 2021. FINDINGS: Trachea midline. Cardiomediastinal contours and hilar structures with stable central  pulmonary vascular engorgement and obscured LEFT cardiac border due to elevated LEFT hemidiaphragm. No sign of lobar consolidative changes. Added density behind the heart on today's study and in the LEFT lower chest. On limited assessment no acute musculoskeletal process. IMPRESSION: Pulmonary vascular engorgement without frank pulmonary edema. Increased density in the retrocardiac region may represent volume loss in the setting of elevated LEFT hemidiaphragm. Difficult to exclude infection in this location. Electronically Signed   By: Zetta Bills M.D.   On: 02/18/2020 13:04    Procedures Procedures (including critical care time)  Medications Ordered in ED Medications  iohexol (OMNIPAQUE) 9 MG/ML oral solution (has no administration in time range)    ED Course  I have reviewed the triage vital signs and the nursing notes.  Pertinent labs & imaging results that were available during my care of the patient were reviewed by me and considered in my medical decision making (see chart for details).    MDM Rules/Calculators/A&P                          Ricky Harris is a 79 y.o. 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  Patient reports that he had his EGD done several days ago and has been doing well since then.  He says that since yesterday, he has been having shortness of breath.  He was post go dialysis today but was feeling too short of breath to do so.  He reports  the shortness breath is exertional and he feels he cannot take a deep breath.  He denies any chest pain or palpitations.  He reports a dry cough with no hemoptysis.  Denies any new leg pain or leg swelling.  Reports his  abdominal pain feels like when he has fluid buildup.  He denies significant nausea, vomiting, constipation, or diarrhea.  Denies any bloody stools.  Reports he still makes some urine but denies dysuria.  He simply feels fatigued and "bad all over with this abdominal soreness and the cough or shortness of breath.  On exam, abdomen is mildly tender.  Bowel sounds were appreciated.  Lungs had rales in the bases bilaterally but chest was nontender.  No murmur.  Good pulses in extremities.  Patient's oxygen saturations were normal on room air.  Patient is not tachycardic or hypoxic.  He is afebrile.  Patient reports that he is trying to get scheduled to get his next paracentesis.  He says that he was simply having too much shortness risk of dialysis today.  He denies any other Covid exposures or symptoms to his knowledge.  Clinically I do feel need to rule out Covid, get a chest ray to look for fluid overload or pneumonia with his cough.  With the exertional shortness of breath and history of CAD, will get a troponin and labs.  Patient reported no significant pain in his abdomen immediately following his procedure and they did not take any biopsies.  Low suspicion for perforation or other complication from his EGD at this time.  I suspect patient's abdominal pain which is consistent with his fluid overload from and ascites are causing the discomfort.  Anticipate getting labs and reassessing patient.  He was ambulated on room air and was not hypoxic.  We will get labs patient is not need emergent dialysis and then may touch base with his gastroenterologist to discuss if he needs outpatient paracentesis or if he needs inpatient paracentesis.  Pain is not going to his back and his blood pressure is 1 around 798 systolic.  It is not tearing or sharp and he has intact pulses in lower extremities.  Low suspicion for aortic etiology of his symptoms.  I suspect this is due to the ascites.   Anticipate reassessment after  work-up.  2:36 PM Spoke to Dr. Alessandra Bevels, the patient's gastroenterologist to the endoscopy already does recommend we get imaging to rule out perforation or other complication.  He also asked that we consider paracentesis if there is a large area of ascites to help with the fluid in the abdomen as well as sending off labs on the fluid to make sure there is not infection.  5:04 PM Care transferred to Dr. Francia Greaves while awaiting CT results and reassessment.  Care transferred in stable condition.   Final Clinical Impression(s) / ED Diagnoses Final diagnoses:  Shortness of breath  Generalized abdominal pain     Clinical Impression: 1. Shortness of breath   2. Generalized abdominal pain     Disposition: Care transferred to Dr. Francia Greaves while awaiting CT results and reassessment.  Care transferred in stable condition.  This note was prepared with assistance of Systems analyst. Occasional wrong-word or sound-a-like substitutions may have occurred due to the inherent limitations of voice recognition software.      Quinto Tippy, Gwenyth Allegra, MD 02/18/20 1705

## 2020-02-18 NOTE — ED Notes (Signed)
Blue top tube sent to lab. 

## 2020-02-18 NOTE — ED Triage Notes (Addendum)
Patient reports that he was suppose to go to dialysis today, but felt too weak, and having SOB and abdominal pain. Patient states symptoms started last night.

## 2020-02-18 NOTE — ED Notes (Signed)
Report called to [place] at [facility].

## 2020-02-18 NOTE — Progress Notes (Signed)
Attempted to return call for report x2. Left direct number for [facility] RN to call back.

## 2020-02-18 NOTE — H&P (Signed)
History and Physical   [name] ERD:[id number] DOB: [birth year] DOA: 02/18/2020  Referring MD/NP/PA: Dr. Francia Greaves  PCP: [name]., MD   Outpatient Specialists: [facility]  Patient coming from: Home  Chief Complaint: Shortness of breath and abdominal pain  HPI: Ricky Harris is a 79 y.o. 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  Patient recently had GI evaluation by Dr. Alessandra Bevels for esophageal varices 4 days ago.  He has been feeling weak and some shortness of breath today.  He has had previous ascites with multiple IR paracentesis.  He came to the ER today because he misses dialysis due to the symptoms.  He is not hypoxic and he is not in distress.  Patient seen and evaluated.  He appears to have fluid overload probably because of missed dialysis but may also be related to his liver cirrhosis.  Patient being admitted to the hospital for evaluation and treatment.  ER physician has contacted [place] as well as nephrology.  Patient will be admitted to [facility].  ED Course: Temperature 98.3 blood pressure 173/73 pulse 60 respirate 26 oxygen sat 93% room air.  White count 6.6 hemoglobin 12.1 platelets 108.  Sodium 135 potassium 4.0 chloride 97 CO2 25 BUN 96 creatinine 8.02 and calcium 8.6.  Lactic acid 0.7 glucose 96.  Chest x-ray showed pulmonary vascular congestion.  CT abdomen pelvis showed no acute findings.  There is moderate ascites and cirrhosis.  Lactic acid 0.8.  COVID-19 screen is negative.  Patient will be admitted for fluid overload and possible paracentesis if hemodialysis did not resolve his problem.  Review of Systems: As per HPI otherwise 10 point review of systems negative.     Past Medical History:  Diagnosis Date  . AAA (abdominal aortic aneurysm) ([place])    5.3cm , magd by vascular Dr Scot Dock ,  . Anemia    " slightly"  . Aortic stenosis   . Arthritis    knees  . Ascites   . Cancer [facility])    prostate  . Chronic kidney disease    stage 4, mgd by Dr Justin Mend   . 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([place])   . Coronary artery disease    on CT scan  . Gout   . H/O hypercholesterolemia   . Heart murmur   . History of falling    recent- see ct chest results of 04/25/2018  . Hypertension   . Hypothyroidism   . Liver cirrhosis ([place])   . Prediabetes    pt denies   . Renal artery stenosis ([place])   . Thrombocytopenia ([place])   . Wears glasses   . Wears glasses     Past Surgical History:  Procedure Laterality Date  . ABDOMINAL AORTIC ENDOVASCULAR STENT GRAFT N/A 01/21/2019   Procedure: ABDOMINAL AORTIC ENDOVASCULAR STENT GRAFT WITH CO2;  Surgeon: Angelia Mould, MD;  Location: [place];  Service: Vascular;  Laterality: N/A;  . ABDOMINAL AORTOGRAM W/LOWER EXTREMITY Bilateral 12/14/2018   Procedure: ABDOMINAL AORTOGRAM W/LOWER EXTREMITY;  Surgeon: Angelia Mould, MD;  Location: [place] CV LAB;  Service: Cardiovascular;  Laterality: Bilateral;  . AV FISTULA PLACEMENT Left 03/12/2019   Procedure: LEFT ARTERIOVENOUS Arteriovenous FISTULA CREATION.;  Surgeon: Angelia Mould, MD;  Location: [facility]  OR;  Service: Vascular;  Laterality: Left;  . CATARACT EXTRACTION W/ INTRAOCULAR LENS  IMPLANT, BILATERAL    . CHOLECYSTECTOMY    . ESOPHAGEAL BANDING N/A 08/11/2017   Procedure: ESOPHAGEAL BANDING;  Surgeon: Otis Brace, MD;  Location: [place];  Service: Gastroenterology;  Laterality: N/A;  . ESOPHAGEAL BANDING N/A 10/26/2017   Procedure: ESOPHAGEAL BANDING;  Surgeon: Otis Brace, MD;  Location: [facility];  Service: Gastroenterology;  Laterality: N/A;  . ESOPHAGEAL BANDING N/A 12/19/2017   Procedure: ESOPHAGEAL BANDING;  Surgeon:  Otis Brace, MD;  Location: [facility];  Service: Gastroenterology;  Laterality: N/A;  . ESOPHAGOGASTRODUODENOSCOPY (EGD) WITH PROPOFOL N/A 07/05/2017   Procedure: ESOPHAGOGASTRODUODENOSCOPY (EGD) WITH PROPOFOL;  Surgeon: Otis Brace, MD;  Location: [facility];  Service: Gastroenterology;  Laterality: N/A;  . ESOPHAGOGASTRODUODENOSCOPY (EGD) WITH PROPOFOL N/A 08/11/2017   Procedure: ESOPHAGOGASTRODUODENOSCOPY (EGD) WITH PROPOFOL;  Surgeon: Otis Brace, MD;  Location: [facility];  Service: Gastroenterology;  Laterality: N/A;  . ESOPHAGOGASTRODUODENOSCOPY (EGD) WITH PROPOFOL N/A 10/26/2017   Procedure: ESOPHAGOGASTRODUODENOSCOPY (EGD) WITH PROPOFOL;  Surgeon: Otis Brace, MD;  Location: [facility];  Service: Gastroenterology;  Laterality: N/A;  . ESOPHAGOGASTRODUODENOSCOPY (EGD) WITH PROPOFOL N/A 12/19/2017   Procedure: ESOPHAGOGASTRODUODENOSCOPY (EGD) WITH PROPOFOL;  Surgeon: Otis Brace, MD;  Location: [facility];  Service: Gastroenterology;  Laterality: N/A;  . ESOPHAGOGASTRODUODENOSCOPY (EGD) WITH PROPOFOL N/A 10/29/2018   Procedure: ESOPHAGOGASTRODUODENOSCOPY (EGD) WITH PROPOFOL;  Surgeon: Otis Brace, MD;  Location: [facility];  Service: Gastroenterology;  Laterality: N/A;  . ESOPHAGOGASTRODUODENOSCOPY (EGD) WITH PROPOFOL N/A 02/14/2020   Procedure: ESOPHAGOGASTRODUODENOSCOPY (EGD) WITH PROPOFOL;  Surgeon: Otis Brace, MD;  Location: [facility];  Service: Gastroenterology;  Laterality: N/A;  . EYE SURGERY     bilateral cataract removal with lens placement  . HERNIA REPAIR    . IR PARACENTESIS  12/26/2018  . IR PARACENTESIS  01/02/2019  . IR PARACENTESIS  01/15/2019  . IR PARACENTESIS  01/30/2019  . IR PARACENTESIS  02/05/2019  . IR PARACENTESIS  02/14/2019  . IR PARACENTESIS  02/28/2019  . IR PARACENTESIS  03/14/2019  . IR PARACENTESIS  03/29/2019  . IR PARACENTESIS  04/11/2019  . IR PARACENTESIS  04/25/2019  . IR PARACENTESIS  05/09/2019  .  IR PARACENTESIS  06/03/2019  . IR PARACENTESIS  06/06/2019  . IR PARACENTESIS  06/20/2019  . IR PARACENTESIS  07/03/2019  . IR PARACENTESIS  07/19/2019  . IR PARACENTESIS  08/08/2019  . IR PARACENTESIS  08/27/2019  . IR PARACENTESIS  09/09/2019  . IR PARACENTESIS  09/24/2019  . IR PARACENTESIS  10/07/2019  . IR PARACENTESIS  10/23/2019  . IR PARACENTESIS  11/07/2019  . IR PARACENTESIS  11/21/2019  . IR PARACENTESIS  12/04/2019  . IR PARACENTESIS  12/25/2019  . IR PARACENTESIS  01/10/2020  . IR PARACENTESIS  01/27/2020  . PROSTATECTOMY    . subtotal gastrectomy       reports that he has never smoked. He has never used smokeless tobacco. He reports previous alcohol use. He reports that he does not use drugs.  Allergies  Allergen Reactions  . Cephalexin Hives  . Hydralazine Hives    Family History  Problem Relation Age of Onset  . Diabetes Mellitus II Mother   . Stroke Mother      Prior to Admission medications   Medication Sig Start Date End Date Taking? Authorizing Provider  atorvastatin (LIPITOR) 10 MG tablet Take 10 mg by mouth at bedtime.  05/06/17  Yes [provider]  carvedilol (COREG) 12.5 MG tablet Take  12.5 mg by mouth 2 (two) times daily. 03/22/17  Yes [provider]  cloNIDine (CATAPRES) 0.1 MG tablet Take 1 tablet (0.1 mg total) by mouth daily. Patient taking differently: Take 0.2 mg by mouth daily. 09/28/19  Yes Autry-Lott, Naaman Plummer, DO  colchicine 0.6 MG tablet Take 0.6 mg by mouth daily as needed (gout flare).  03/22/17  Yes [provider]  febuxostat (ULORIC) 40 MG tablet Take 40 mg by mouth daily.   Yes [provider]  lactulose (CHRONULAC) 10 GM/15ML solution Take 10 mLs by mouth daily as needed for moderate constipation. 01/15/20  Yes [provider]  levothyroxine (SYNTHROID) 25 MCG tablet Take 25 mcg by mouth daily. 07/20/19  Yes [provider]  traMADol (ULTRAM) 50 MG tablet Take 1 tablet (50 mg total) by mouth every 8  (eight) hours as needed for moderate pain. 03/12/19  Yes Rhyne, Hulen Shouts, PA-C  multivitamin (RENA-VIT) TABS tablet Take 1 tablet by mouth daily. Patient not taking: No sig reported 09/27/19   Autry-Lott, Naaman Plummer, DO  sodium bicarbonate 650 MG tablet Take 650 mg by mouth 2 (two) times daily.    [provider]    Physical Exam: Vitals:   02/18/20 2045 02/18/20 2100 02/18/20 2115 02/18/20 2130  BP: (!) 143/65 139/77 (!) 156/70 (!) 142/69  Pulse: 63 63 65 63  Resp: 20 17 19  (!) 22  Temp:      SpO2: 93% 94% 94% 94%  Weight:      Height:          Constitutional: Chronically ill looking, obese, no distress Vitals:   02/18/20 2045 02/18/20 2100 02/18/20 2115 02/18/20 2130  BP: (!) 143/65 139/77 (!) 156/70 (!) 142/69  Pulse: 63 63 65 63  Resp: 20 17 19  (!) 22  Temp:      SpO2: 93% 94% 94% 94%  Weight:      Height:       Eyes: PERRL, lids and conjunctivae normal ENMT: Mucous membranes are moist. Posterior pharynx clear of any exudate or lesions.Normal dentition.  Neck: normal, supple, no masses, no thyromegaly Respiratory: Coarse breath sound bilaterally with crackles no wheeze normal respiratory effort. No accessory muscle use.  Cardiovascular: Regular rate and rhythm, no murmurs / rubs / gallops. No extremity edema. 2+ pedal pulses. No carotid bruits.  Abdomen: Soft but mildly distended, mildly tender diffusely, no masses palpated. No hepatosplenomegaly. Bowel sounds positive.  Musculoskeletal: no clubbing / cyanosis. No joint deformity upper and lower extremities. Good ROM, no contractures. Normal muscle tone.  Skin: no rashes, lesions, ulcers. No induration Neurologic: CN 2-12 grossly intact. Sensation intact, DTR normal. Strength 5/5 in all 4.  Psychiatric: Normal judgment and insight. Alert and oriented x 3.  Depressed mood.     Labs on Admission: I have personally reviewed following labs and imaging studies  CBC: Recent Labs  Lab 02/14/20 0832 02/18/20 1142   WBC  --  6.6  HGB 12.6* 12.2*  HCT 37.0* 37.2*  MCV  --  100.0  PLT  --  782*   Basic Metabolic Panel: Recent Labs  Lab 02/14/20 0832 02/18/20 1142  NA 138 135  K 3.9 4.1  CL 99 97*  CO2  --  25  GLUCOSE 91 96  BUN 40* 96*  CREATININE 5.30* 8.02*  CALCIUM  --  8.6*   GFR: Estimated Creatinine Clearance: 7.9 mL/min (A) (by C-G formula based on SCr of 8.02 mg/dL (H)). Liver Function Tests: Recent Labs  Lab 02/18/20  1223  AST 85*  ALT 58*  ALKPHOS 248*  BILITOT 0.8  PROT 6.8  ALBUMIN 3.3*   Recent Labs  Lab 02/18/20 1223  LIPASE 46   No results for input(s): AMMONIA in the last 168 hours. Coagulation Profile: No results for input(s): INR, PROTIME in the last 168 hours. Cardiac Enzymes: No results for input(s): CKTOTAL, CKMB, CKMBINDEX, TROPONINI in the last 168 hours. BNP (last 3 results) No results for input(s): PROBNP in the last 8760 hours. HbA1C: No results for input(s): HGBA1C in the last 72 hours. CBG: No results for input(s): GLUCAP in the last 168 hours. Lipid Profile: No results for input(s): CHOL, HDL, LDLCALC, TRIG, CHOLHDL, LDLDIRECT in the last 72 hours. Thyroid Function Tests: No results for input(s): TSH, T4TOTAL, FREET4, T3FREE, THYROIDAB in the last 72 hours. Anemia Panel: No results for input(s): VITAMINB12, FOLATE, FERRITIN, TIBC, IRON, RETICCTPCT in the last 72 hours. Urine analysis:    Component Value Date/Time   COLORURINE YELLOW 01/18/2019 1230   APPEARANCEUR CLEAR 01/18/2019 1230   LABSPEC 1.017 01/18/2019 1230   PHURINE 5.0 01/18/2019 1230   GLUCOSEU NEGATIVE 01/18/2019 1230   HGBUR NEGATIVE 01/18/2019 [place] 01/18/2019 1230   KETONESUR NEGATIVE 01/18/2019 1230   PROTEINUR 100 (A) 01/18/2019 1230   NITRITE NEGATIVE 01/18/2019 1230   LEUKOCYTESUR NEGATIVE 01/18/2019 1230   Sepsis Labs: @LABRCNTIP (procalcitonin:4,lacticidven:4) ) Recent Results (from the past 240 hour(s))  SARS CORONAVIRUS 2 (TAT 6-24  HRS) Nasopharyngeal Nasopharyngeal Swab     Status: None   Collection Time: 02/12/20 11:23 AM   Specimen: Nasopharyngeal Swab  Result Value Ref Range Status   SARS Coronavirus 2 NEGATIVE NEGATIVE Final    Comment: (NOTE) SARS-CoV-2 target nucleic acids are NOT DETECTED.  The SARS-CoV-2 RNA is generally detectable in upper and lower respiratory specimens during the acute phase of infection. Negative results do not preclude SARS-CoV-2 infection, do not rule out co-infections with other pathogens, and should not be used as the sole basis for treatment or other patient management decisions. Negative results must be combined with clinical observations, patient history, and epidemiological information. The expected result is Negative.  Fact Sheet for Patients: SugarRoll.be  Fact Sheet for Healthcare Providers: https://www.woods-mathews.com/  This test is not yet approved or cleared by the [place] FDA and  has been authorized for detection and/or diagnosis of SARS-CoV-2 by FDA under an Emergency Use Authorization (EUA). This EUA will remain  in effect (meaning this test can be used) for the duration of the COVID-19 declaration under Se ction 564(b)(1) of the Act, 21 U.S.C. section 360bbb-3(b)(1), unless the authorization is terminated or revoked sooner.  Performed at [facility], [place] [address] [place], [place] [address]   Resp Panel by RT-PCR (Flu A&B, Covid) Nasopharyngeal Swab     Status: None   Collection Time: 02/18/20 12:35 PM   Specimen: Nasopharyngeal Swab; Nasopharyngeal(NP) swabs in vial transport medium  Result Value Ref Range Status   SARS Coronavirus 2 by RT PCR NEGATIVE NEGATIVE Final    Comment: (NOTE) SARS-CoV-2 target nucleic acids are NOT DETECTED.  The SARS-CoV-2 RNA is generally detectable in upper respiratory specimens during the acute phase of infection. The lowest concentration of SARS-CoV-2 viral copies  this assay can detect is 138 copies/mL. A negative result does not preclude SARS-Cov-2 infection and should not be used as the sole basis for treatment or other patient management decisions. A negative result may occur with  improper specimen collection/handling, submission of specimen  other than nasopharyngeal swab, presence of viral mutation(s) within the areas targeted by this assay, and inadequate number of viral copies(<138 copies/mL). A negative result must be combined with clinical observations, patient history, and epidemiological information. The expected result is Negative.  Fact Sheet for Patients:  EntrepreneurPulse.com.au  Fact Sheet for Healthcare Providers:  IncredibleEmployment.be  This test is no t yet approved or cleared by the [place] FDA and  has been authorized for detection and/or diagnosis of SARS-CoV-2 by FDA under an Emergency Use Authorization (EUA). This EUA will remain  in effect (meaning this test can be used) for the duration of the COVID-19 declaration under Section 564(b)(1) of the Act, 21 U.S.C.section 360bbb-3(b)(1), unless the authorization is terminated  or revoked sooner.       Influenza A by PCR NEGATIVE NEGATIVE Final   Influenza B by PCR NEGATIVE NEGATIVE Final    Comment: (NOTE) The Xpert Xpress SARS-CoV-2/FLU/RSV plus assay is intended as an aid in the diagnosis of influenza from Nasopharyngeal swab specimens and should not be used as a sole basis for treatment. Nasal washings and aspirates are unacceptable for Xpert Xpress SARS-CoV-2/FLU/RSV testing.  Fact Sheet for Patients: EntrepreneurPulse.com.au  Fact Sheet for Healthcare Providers: IncredibleEmployment.be  This test is not yet approved or cleared by the [place] FDA and has been authorized for detection and/or diagnosis of SARS-CoV-2 by FDA under an Emergency Use Authorization (EUA). This EUA  will remain in effect (meaning this test can be used) for the duration of the COVID-19 declaration under Section 564(b)(1) of the Act, 21 U.S.C. section 360bbb-3(b)(1), unless the authorization is terminated or revoked.  Performed at [facility], [place] [address], [place],  [address]      Radiological Exams on Admission: CT ABDOMEN PELVIS WO CONTRAST  Result Date: 02/18/2020 CLINICAL DATA:  Abdominal pain and distention after recent EGD. History of cirrhosis. EXAM: CT ABDOMEN AND PELVIS WITHOUT CONTRAST TECHNIQUE: Multidetector CT imaging of the abdomen and pelvis was performed following the standard protocol without IV contrast. COMPARISON:  CT abdomen pelvis dated November 15, 2019. FINDINGS: Lower chest: No acute abnormality. Subsegmental atelectasis in both lower lobes. Elevated left hemidiaphragm. Hepatobiliary: Unchanged cirrhotic appearing liver. No focal liver abnormality. Status post cholecystectomy. No biliary dilatation. Pancreas: Atrophic. No ductal dilatation or surrounding inflammatory changes. Spleen: Unchanged mild splenomegaly.  No focal abnormality. Adrenals/Urinary Tract: The adrenal glands are unremarkable. Unchanged bilateral renal atrophy and cysts. Unchanged punctate calculus in the lower pole the right kidney. No hydronephrosis. The bladder is unremarkable. Stomach/Bowel: Stomach is within normal limits. No evidence of bowel wall thickening, distention, or inflammatory changes. Moderate left-sided colonic diverticulosis. Diminutive or absent appendix. Vascular/Lymphatic: Unchanged 5.5 cm infrarenal abdominal aortic aneurysm status post stent graft repair. Aortoiliac atherosclerotic vascular disease. No enlarged abdominal or pelvic lymph nodes. Reproductive: Prior prostatectomy. 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 Other: Moderate ascites, decreased when compared to prior study. No pneumoperitoneum. Musculoskeletal: No  acute or significant osseous findings. IMPRESSION: 1. No acute intra-abdominal process. 2. Moderate ascites, decreased when compared to prior study. 3. Unchanged cirrhosis and mild splenomegaly. 4. Unchanged punctate right nephrolithiasis. 5. Aortic Atherosclerosis (ICD10-I70.0). Electronically Signed   By: Titus Dubin M.D.   On: 02/18/2020 17:35   DG Chest Portable 1 View  Result Date: 02/18/2020 CLINICAL DATA:  This dialysis, shortness of breath and nonproductive cough and weakness EXAM: PORTABLE CHEST 1 VIEW COMPARISON:  October 20th of 2021. FINDINGS: Trachea midline. Cardiomediastinal contours and hilar  structures with stable central pulmonary vascular engorgement and obscured LEFT cardiac border due to elevated LEFT hemidiaphragm. No sign of lobar consolidative changes. Added density behind the heart on today's study and in the LEFT lower chest. On limited assessment no acute musculoskeletal process. IMPRESSION: Pulmonary vascular engorgement without frank pulmonary edema. Increased density in the retrocardiac region may represent volume loss in the setting of elevated LEFT hemidiaphragm. Difficult to exclude infection in this location. Electronically Signed   By: Zetta Bills M.D.   On: 02/18/2020 13:04      Assessment/Plan Principal Problem:   Dyspnea Active Problems:   Ascites   Essential hypertension   HLD (hyperlipidemia)   Hypothyroidism   AAA (abdominal aortic aneurysm) without rupture (HCC)   ESRD (end stage renal disease) (HCC)     #1 dyspnea: Multifactorial but mainly fluid overload.  Probably from missing hemodialysis as well as increased fluid from liver cirrhosis.  Patient will be admitted to [facility] where he can get hemodialyzed tomorrow.  Nephrology to be consulted.  Patient to be assessed for possible paracentesis.  His abdomen is not markedly distended but per patient despite that he usually gets paracentesis.  #2 end-stage renal disease: Hemodialysis  Tuesdays Thursdays and Saturdays.  Continue per nephrology  #3 liver cirrhosis: With ascites.  No acute bleed.  No decompensation.  Follows with [facility].  #4 hyperlipidemia: Confirm and resume home regimen  #5 hypothyroidism: Continue levothyroxine  #6 abdominal aortic aneurysm: Continue outpatient follow-up.  #7 essential hypertension: Continue to monitor blood pressure.     DVT prophylaxis: SCD Code Status: Full code Family Communication: Son at bedside Disposition Plan: Home Consults called: Consult nephrology in the morning Admission status: Observation  Severity of Illness: The appropriate patient status for this patient is OBSERVATION. Observation status is judged to be reasonable and necessary in order to provide the required intensity of service to ensure the patient's safety. The patient's presenting symptoms, physical exam findings, and initial radiographic and laboratory data in the context of their medical condition is felt to place them at decreased risk for further clinical deterioration. Furthermore, it is anticipated that the patient will be medically stable for discharge from the hospital within 2 midnights of admission. The following factors support the patient status of observation.   " The patient's presenting symptoms include shortness of breath and abdominal pain. " The physical exam findings include evidence of fluid overload. " The initial radiographic and laboratory data are CT findings of fluid overload and ascites.     Barbette Merino MD [facility] Pager [phone]  If 7PM-7AM, please contact night-coverage [email or web address] Password [facility]  02/18/2020, 9:49 PM

## 2020-02-18 NOTE — ED Notes (Signed)
Transport requested through [employer].

## 2020-02-18 NOTE — ED Notes (Signed)
Attempted to call report to [facility], RN is unavailable and will return my call.

## 2020-02-19 ENCOUNTER — Inpatient Hospital Stay (HOSPITAL_COMMUNITY): Payer: Medicare Other

## 2020-02-19 DIAGNOSIS — I714 Abdominal aortic aneurysm, without rupture: Secondary | ICD-10-CM

## 2020-02-19 LAB — CBC
HCT: 33.4 % — ABNORMAL LOW (ref 39.0–52.0)
Hemoglobin: 11.3 g/dL — ABNORMAL LOW (ref 13.0–17.0)
MCH: 33.2 pg (ref 26.0–34.0)
MCHC: 33.8 g/dL (ref 30.0–36.0)
MCV: 98.2 fL (ref 80.0–100.0)
Platelets: 99 10*3/uL — ABNORMAL LOW (ref 150–400)
RBC: 3.4 MIL/uL — ABNORMAL LOW (ref 4.22–5.81)
RDW: 16.5 % — ABNORMAL HIGH (ref 11.5–15.5)
WBC: 5.7 10*3/uL (ref 4.0–10.5)
nRBC: 0 % (ref 0.0–0.2)

## 2020-02-19 LAB — ALBUMIN, PLEURAL OR PERITONEAL FLUID: Albumin, Fluid: 1.5 g/dL

## 2020-02-19 LAB — BODY FLUID CELL COUNT WITH DIFFERENTIAL
Eos, Fluid: 0 %
Lymphs, Fluid: 62 %
Monocyte-Macrophage-Serous Fluid: 34 % — ABNORMAL LOW (ref 50–90)
Neutrophil Count, Fluid: 3 % (ref 0–25)
Other Cells, Fluid: 1 %
Total Nucleated Cell Count, Fluid: 328 cu mm (ref 0–1000)

## 2020-02-19 LAB — RENAL FUNCTION PANEL
Albumin: 2.7 g/dL — ABNORMAL LOW (ref 3.5–5.0)
Anion gap: 15 (ref 5–15)
BUN: 101 mg/dL — ABNORMAL HIGH (ref 8–23)
CO2: 22 mmol/L (ref 22–32)
Calcium: 8.1 mg/dL — ABNORMAL LOW (ref 8.9–10.3)
Chloride: 98 mmol/L (ref 98–111)
Creatinine, Ser: 8.73 mg/dL — ABNORMAL HIGH (ref 0.61–1.24)
GFR, Estimated: 6 mL/min — ABNORMAL LOW (ref >60)
Glucose, Bld: 99 mg/dL (ref 70–99)
Phosphorus: 5 mg/dL — ABNORMAL HIGH (ref 2.5–4.6)
Potassium: 4.1 mmol/L (ref 3.5–5.1)
Sodium: 135 mmol/L (ref 135–145)

## 2020-02-19 LAB — PROTEIN, PLEURAL OR PERITONEAL FLUID: Total protein, fluid: <3 g/dL

## 2020-02-19 LAB — GRAM STAIN

## 2020-02-19 LAB — GLUCOSE, PLEURAL OR PERITONEAL FLUID: Glucose, Fluid: 94 mg/dL

## 2020-02-19 MED ORDER — CALCITRIOL 0.25 MCG PO CAPS
ORAL_CAPSULE | ORAL | Status: AC
  Administered 2020-02-19: 0.25 ug via ORAL
  Filled 2020-02-19: qty 1

## 2020-02-19 MED ORDER — NEPRO/CARBSTEADY PO LIQD: 237.0000 mL | Freq: Three times a day (TID) | ORAL | Status: DC | PRN

## 2020-02-19 MED ORDER — HEPARIN SODIUM (PORCINE) 1000 UNIT/ML IJ SOLN
INTRAMUSCULAR | Status: AC
  Administered 2020-02-19: 3000 [IU]
  Filled 2020-02-19: qty 3

## 2020-02-19 MED ORDER — CHLORHEXIDINE GLUCONATE CLOTH 2 % EX PADS: 6.0000 | MEDICATED_PAD | Freq: Every day | CUTANEOUS | Status: DC

## 2020-02-19 MED ORDER — RENA-VITE PO TABS
1.0000 | ORAL_TABLET | Freq: Every day | ORAL | Status: DC
  Administered 2020-02-19: 1 via ORAL
  Filled 2020-02-19: qty 1

## 2020-02-19 MED ORDER — PROSOURCE TF PO LIQD
45.0000 mL | Freq: Two times a day (BID) | ORAL | Status: DC
  Filled 2020-02-19 (×4): qty 45

## 2020-02-19 MED ORDER — LIDOCAINE HCL (PF) 1 % IJ SOLN
INTRAMUSCULAR | Status: DC | PRN
  Administered 2020-02-19: 10 mL

## 2020-02-19 MED ORDER — ATORVASTATIN CALCIUM 10 MG PO TABS
10.0000 mg | ORAL_TABLET | Freq: Every day | ORAL | Status: DC
  Administered 2020-02-19: 10 mg via ORAL
  Filled 2020-02-19: qty 1

## 2020-02-19 MED ORDER — CALCIUM ACETATE (PHOS BINDER) 667 MG PO CAPS
667.0000 mg | ORAL_CAPSULE | Freq: Three times a day (TID) | ORAL | Status: DC
  Filled 2020-02-19: qty 1

## 2020-02-19 MED ORDER — LIDOCAINE HCL 1 % IJ SOLN
INTRAMUSCULAR | Status: AC
  Filled 2020-02-19: qty 20

## 2020-02-19 MED ORDER — LEVOTHYROXINE SODIUM 25 MCG PO TABS
25.0000 ug | ORAL_TABLET | Freq: Every day | ORAL | Status: DC
  Administered 2020-02-20: 25 ug via ORAL
  Filled 2020-02-19: qty 1

## 2020-02-19 MED ORDER — CALCITRIOL 0.25 MCG PO CAPS: 0.2500 ug | ORAL_CAPSULE | ORAL | Status: DC

## 2020-02-19 MED ORDER — CARVEDILOL 12.5 MG PO TABS
12.5000 mg | ORAL_TABLET | Freq: Two times a day (BID) | ORAL | Status: DC
  Administered 2020-02-19: 12.5 mg via ORAL
  Filled 2020-02-19: qty 1

## 2020-02-19 NOTE — Consult Note (Signed)
[facility] Renal Consultation Note    Indication for Consultation:  Management of ESRD/hemodialysis; anemia, hypertension/volume and secondary hyperparathyroidism PCP: Dr. Gilford Rile Nephrologist: Dr. Johnney Ou  HPI: Ricky Harris is a 79 y.o. male with ESRD on hemodialysis T,TH,S at [facility]. PMH: HTN, non-alcoholic cirrhosis with ascites requiring frequent paracentesis (last done 01/27/2020 3.2 liters), esophageal varices, prostate cancer, AAA, anemia of ESRD, SHPT. He missed HD 02/18/2020 D/T being "too short of breath to attend". He is usually compliant with dialysis prescription.   He presented to ED 02/18/2020 with C/O weakness and fatigue, SOB. Upon arrival to ED, SCr 8.02 BUN 96 K+ 4.1 Na 135 AST/ALT 85/58, CBC unremarkable.  CXR with vascular congestion without overt pulmonary edema. He has been admitted for volume overload.   Seen in room this AM. He has no specific complaints. He is RA, denies SOB at present. Does have moderate amount of ascites present in abdomen. Missed HD 02/18/2020. Will order HD today for volume removal but doubtful this will help his SOB as he needs paracentesis. BUN elevated but no evidence of uremia. Mental status at baseline.   Past Medical History:  Diagnosis Date  . AAA (abdominal aortic aneurysm) ([place])    5.3cm , magd by vascular Dr Scot Dock ,  . Anemia    " slightly"  . Aortic stenosis   . Arthritis    knees  . Ascites   . Cancer [facility])    prostate  . Chronic kidney disease    stage 4, mgd by Dr Justin Mend   . 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([place])   . Coronary artery disease    on CT scan  . Gout   . H/O hypercholesterolemia   . Heart murmur   . History of falling    recent- see ct chest results of 04/25/2018  . Hypertension   . Hypothyroidism   . Liver cirrhosis ([place])   . Prediabetes    pt denies   . Renal artery stenosis ([place])   . Thrombocytopenia ([place])   . Wears glasses   . Wears glasses    Past  Surgical History:  Procedure Laterality Date  . ABDOMINAL AORTIC ENDOVASCULAR STENT GRAFT N/A 01/21/2019   Procedure: ABDOMINAL AORTIC ENDOVASCULAR STENT GRAFT WITH CO2;  Surgeon: Angelia Mould, MD;  Location: [place];  Service: Vascular;  Laterality: N/A;  . ABDOMINAL AORTOGRAM W/LOWER EXTREMITY Bilateral 12/14/2018   Procedure: ABDOMINAL AORTOGRAM W/LOWER EXTREMITY;  Surgeon: Angelia Mould, MD;  Location: [place] [facility];  Service: Cardiovascular;  Laterality: Bilateral;  . AV FISTULA PLACEMENT Left 03/12/2019   Procedure: LEFT ARTERIOVENOUS Arteriovenous FISTULA CREATION.;  Surgeon: Angelia Mould, MD;  Location: [place];  Service: Vascular;  Laterality: Left;  . CATARACT EXTRACTION W/ INTRAOCULAR LENS  IMPLANT, BILATERAL    . CHOLECYSTECTOMY    . ESOPHAGEAL BANDING N/A 08/11/2017   Procedure: ESOPHAGEAL BANDING;  Surgeon: Otis Brace, MD;  Location: [place];  Service: Gastroenterology;  Laterality: N/A;  . ESOPHAGEAL BANDING N/A 10/26/2017   Procedure: ESOPHAGEAL BANDING;  Surgeon: Otis Brace, MD;  Location: [facility];  Service: Gastroenterology;  Laterality: N/A;  . ESOPHAGEAL BANDING N/A 12/19/2017   Procedure: ESOPHAGEAL BANDING;  Surgeon: Otis Brace, MD;  Location: [facility];  Service: Gastroenterology;  Laterality: N/A;  . ESOPHAGOGASTRODUODENOSCOPY (EGD) WITH PROPOFOL N/A 07/05/2017   Procedure: ESOPHAGOGASTRODUODENOSCOPY (EGD) WITH PROPOFOL;  Surgeon: Otis Brace, MD;  Location: [facility];  Service: Gastroenterology;  Laterality: N/A;  . ESOPHAGOGASTRODUODENOSCOPY (  EGD) WITH PROPOFOL N/A 08/11/2017   Procedure: ESOPHAGOGASTRODUODENOSCOPY (EGD) WITH PROPOFOL;  Surgeon: Otis Brace, MD;  Location: [place];  Service: Gastroenterology;  Laterality: N/A;  . ESOPHAGOGASTRODUODENOSCOPY (EGD) WITH PROPOFOL N/A 10/26/2017   Procedure: ESOPHAGOGASTRODUODENOSCOPY (EGD) WITH PROPOFOL;  Surgeon: Otis Brace, MD;  Location:  [facility];  Service: Gastroenterology;  Laterality: N/A;  . ESOPHAGOGASTRODUODENOSCOPY (EGD) WITH PROPOFOL N/A 12/19/2017   Procedure: ESOPHAGOGASTRODUODENOSCOPY (EGD) WITH PROPOFOL;  Surgeon: Otis Brace, MD;  Location: [facility];  Service: Gastroenterology;  Laterality: N/A;  . ESOPHAGOGASTRODUODENOSCOPY (EGD) WITH PROPOFOL N/A 10/29/2018   Procedure: ESOPHAGOGASTRODUODENOSCOPY (EGD) WITH PROPOFOL;  Surgeon: Otis Brace, MD;  Location: [facility];  Service: Gastroenterology;  Laterality: N/A;  . ESOPHAGOGASTRODUODENOSCOPY (EGD) WITH PROPOFOL N/A 02/14/2020   Procedure: ESOPHAGOGASTRODUODENOSCOPY (EGD) WITH PROPOFOL;  Surgeon: Otis Brace, MD;  Location: [facility];  Service: Gastroenterology;  Laterality: N/A;  . EYE SURGERY     bilateral cataract removal with lens placement  . HERNIA REPAIR    . IR PARACENTESIS  12/26/2018  . IR PARACENTESIS  01/02/2019  . IR PARACENTESIS  01/15/2019  . IR PARACENTESIS  01/30/2019  . IR PARACENTESIS  02/05/2019  . IR PARACENTESIS  02/14/2019  . IR PARACENTESIS  02/28/2019  . IR PARACENTESIS  03/14/2019  . IR PARACENTESIS  03/29/2019  . IR PARACENTESIS  04/11/2019  . IR PARACENTESIS  04/25/2019  . IR PARACENTESIS  05/09/2019  . IR PARACENTESIS  06/03/2019  . IR PARACENTESIS  06/06/2019  . IR PARACENTESIS  06/20/2019  . IR PARACENTESIS  07/03/2019  . IR PARACENTESIS  07/19/2019  . IR PARACENTESIS  08/08/2019  . IR PARACENTESIS  08/27/2019  . IR PARACENTESIS  09/09/2019  . IR PARACENTESIS  09/24/2019  . IR PARACENTESIS  10/07/2019  . IR PARACENTESIS  10/23/2019  . IR PARACENTESIS  11/07/2019  . IR PARACENTESIS  11/21/2019  . IR PARACENTESIS  12/04/2019  . IR PARACENTESIS  12/25/2019  . IR PARACENTESIS  01/10/2020  . IR PARACENTESIS  01/27/2020  . PROSTATECTOMY    . subtotal gastrectomy     Family History  Problem Relation Age of Onset  . Diabetes Mellitus II Mother   . Stroke Mother    Social History:  reports that he has never smoked.  He has never used smokeless tobacco. He reports previous alcohol use. He reports that he does not use drugs. Allergies  Allergen Reactions  . Cephalexin Hives  . Hydralazine Hives   Prior to Admission medications   Medication Sig Start Date End Date Taking? Authorizing Provider  atorvastatin (LIPITOR) 10 MG tablet Take 10 mg by mouth at bedtime.  05/06/17  Yes [provider]  carvedilol (COREG) 12.5 MG tablet Take 12.5 mg by mouth 2 (two) times daily. 03/22/17  Yes [provider]  cloNIDine (CATAPRES) 0.1 MG tablet Take 1 tablet (0.1 mg total) by mouth daily. Patient taking differently: Take 0.2 mg by mouth daily. 09/28/19  Yes Autry-Lott, Naaman Plummer, DO  colchicine 0.6 MG tablet Take 0.6 mg by mouth daily as needed (gout flare).  03/22/17  Yes [provider]  febuxostat (ULORIC) 40 MG tablet Take 40 mg by mouth daily.   Yes [provider]  lactulose (CHRONULAC) 10 GM/15ML solution Take 10 mLs by mouth daily as needed for moderate constipation. 01/15/20  Yes [provider]  levothyroxine (SYNTHROID) 25 MCG tablet Take 25 mcg by mouth daily. 07/20/19  Yes [provider]  traMADol (ULTRAM) 50 MG tablet Take 1 tablet (50 mg total) by  mouth every 8 (eight) hours as needed for moderate pain. 03/12/19  Yes Rhyne, Hulen Shouts, PA-C  multivitamin (RENA-VIT) TABS tablet Take 1 tablet by mouth daily. Patient not taking: No sig reported 09/27/19   Autry-Lott, Naaman Plummer, DO  sodium bicarbonate 650 MG tablet Take 650 mg by mouth 2 (two) times daily.    [provider]   Current Facility-Administered Medications  Medication Dose Route Frequency Provider Last Rate Last Admin  . 0.9 %  sodium chloride infusion  250 mL Intravenous PRN Elwyn Reach, MD      . acetaminophen (TYLENOL) tablet 650 mg  650 mg Oral Q6H PRN Elwyn Reach, MD       Or  . acetaminophen (TYLENOL) suppository 650 mg  650 mg Rectal Q6H PRN Gala Romney L, MD      .  calcitRIOL (ROCALTROL) capsule 0.25 mcg  0.25 mcg Oral Q T,Th,Sat-1800 Valentina Gu, NP   0.25 mcg at 02/19/20 1108  . calcium carbonate (dosed in mg elemental calcium) suspension 500 mg of elemental calcium  500 mg of elemental calcium Oral Q6H PRN Elwyn Reach, MD      . camphor-menthol (SARNA) lotion 1 application  1 application Topical W4Y PRN Elwyn Reach, MD       And  . hydrOXYzine (ATARAX/VISTARIL) tablet 25 mg  25 mg Oral Q8H PRN Elwyn Reach, MD      . Chlorhexidine Gluconate Cloth 2 % PADS 6 each  6 each Topical Q0600 Valentina Gu, NP      . docusate sodium [facility] enema 283 mg  1 enema Rectal PRN Elwyn Reach, MD      . feeding supplement (NEPRO CARB STEADY) liquid 237 mL  237 mL Oral TID PRN Elwyn Reach, MD      . ondansetron (ZOFRAN) tablet 4 mg  4 mg Oral Q6H PRN Elwyn Reach, MD       Or  . ondansetron (ZOFRAN) injection 4 mg  4 mg Intravenous Q6H PRN Jonelle Sidle, Mohammad L, MD      . sodium chloride flush (NS) 0.9 % injection 3 mL  3 mL Intravenous Q12H Garba, Mohammad L, MD      . sodium chloride flush (NS) 0.9 % injection 3 mL  3 mL Intravenous PRN Jonelle Sidle, Mohammad L, MD      . sorbitol 70 % solution 30 mL  30 mL Oral PRN Elwyn Reach, MD      . zolpidem (AMBIEN) tablet 5 mg  5 mg Oral QHS PRN Elwyn Reach, MD       Labs: Basic Metabolic Panel: Recent Labs  Lab 02/14/20 0832 02/18/20 1142 02/19/20 0214  NA 138 135 135  K 3.9 4.1 4.1  CL 99 97* 98  CO2  --  25 22  GLUCOSE 91 96 99  BUN 40* 96* 101*  CREATININE 5.30* 8.02* 8.73*  CALCIUM  --  8.6* 8.1*  PHOS  --   --  5.0*   Liver Function Tests: Recent Labs  Lab 02/18/20 1223 02/19/20 0214  AST 85*  --   ALT 58*  --   ALKPHOS 248*  --   BILITOT 0.8  --   PROT 6.8  --   ALBUMIN 3.3* 2.7*   Recent Labs  Lab 02/18/20 1223  LIPASE 46   No results for input(s): AMMONIA in the last 168 hours. CBC: Recent Labs  Lab 02/14/20 0832 02/18/20 1142  02/19/20 0214  WBC  --  6.6 5.7  HGB 12.6* 12.2* 11.3*  HCT 37.0* 37.2* 33.4*  MCV  --  100.0 98.2  PLT  --  108* 99*   Cardiac Enzymes: No results for input(s): CKTOTAL, CKMB, CKMBINDEX, TROPONINI in the last 168 hours. CBG: No results for input(s): GLUCAP in the last 168 hours. Iron Studies: No results for input(s): IRON, TIBC, TRANSFERRIN, FERRITIN in the last 72 hours. Studies/Results: CT ABDOMEN PELVIS WO CONTRAST  Result Date: 02/18/2020 CLINICAL DATA:  Abdominal pain and distention after recent EGD. History of cirrhosis. EXAM: CT ABDOMEN AND PELVIS WITHOUT CONTRAST TECHNIQUE: Multidetector CT imaging of the abdomen and pelvis was performed following the standard protocol without IV contrast. COMPARISON:  CT abdomen pelvis dated November 15, 2019. FINDINGS: Lower chest: No acute abnormality. Subsegmental atelectasis in both lower lobes. Elevated left hemidiaphragm. Hepatobiliary: Unchanged cirrhotic appearing liver. No focal liver abnormality. Status post cholecystectomy. No biliary dilatation. Pancreas: Atrophic. No ductal dilatation or surrounding inflammatory changes. Spleen: Unchanged mild splenomegaly.  No focal abnormality. Adrenals/Urinary Tract: The adrenal glands are unremarkable. Unchanged bilateral renal atrophy and cysts. Unchanged punctate calculus in the lower pole the right kidney. No hydronephrosis. The bladder is unremarkable. Stomach/Bowel: Stomach is within normal limits. No evidence of bowel wall thickening, distention, or inflammatory changes. Moderate left-sided colonic diverticulosis. Diminutive or absent appendix. Vascular/Lymphatic: Unchanged 5.5 cm infrarenal abdominal aortic aneurysm status post stent graft repair. Aortoiliac atherosclerotic vascular disease. No enlarged abdominal or pelvic lymph nodes. Reproductive: Prior prostatectomy. 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 Other: Moderate ascites, decreased when compared to prior  study. No pneumoperitoneum. Musculoskeletal: No acute or significant osseous findings. IMPRESSION: 1. No acute intra-abdominal process. 2. Moderate ascites, decreased when compared to prior study. 3. Unchanged cirrhosis and mild splenomegaly. 4. Unchanged punctate right nephrolithiasis. 5. Aortic Atherosclerosis (ICD10-I70.0). Electronically Signed   By: Titus Dubin M.D.   On: 02/18/2020 17:35   DG Chest Portable 1 View  Result Date: 02/18/2020 CLINICAL DATA:  This dialysis, shortness of breath and nonproductive cough and weakness EXAM: PORTABLE CHEST 1 VIEW COMPARISON:  October 20th of 2021. FINDINGS: Trachea midline. Cardiomediastinal contours and hilar structures with stable central pulmonary vascular engorgement and obscured LEFT cardiac border due to elevated LEFT hemidiaphragm. No sign of lobar consolidative changes. Added density behind the heart on today's study and in the LEFT lower chest. On limited assessment no acute musculoskeletal process. IMPRESSION: Pulmonary vascular engorgement without frank pulmonary edema. Increased density in the retrocardiac region may represent volume loss in the setting of elevated LEFT hemidiaphragm. Difficult to exclude infection in this location. Electronically Signed   By: Zetta Bills M.D.   On: 02/18/2020 13:04    ROS: As per HPI otherwise negative.    Physical Exam: Vitals:   02/19/20 1205 02/19/20 1220 02/19/20 1235 02/19/20 1350  BP: (!) 159/91 (!) 155/70 (!) 162/61 (!) 163/79  Pulse: 77 78 80 85  Resp: 19 20 (!) 23 (!) 21  Temp:      TempSrc:      SpO2: 95% 96% 96% 94%  Weight:      Height:         General: Frail, chronically ill appearing in no acute distress. Head: Normocephalic, atraumatic, sclera non-icteric, mucus membranes are moist Neck: Supple. JVD not elevated. Lungs: Clear bilaterally to auscultation without wheezes, rales, or rhonchi. Breathing is unlabored. Heart: RRR with S1 S2. No murmurs, rubs, or gallops  appreciated. Abdomen: soft, non-tender with ascites present.  M-S:  Strength and  tone appear normal for age. Lower extremities:without edema or ischemic changes, no open wounds  Neuro: Alert and oriented X 3. Moves all extremities spontaneously. Psych:  Responds to questions appropriately with a normal affect. Dialysis Access: L AVF +T/B  Dialysis Orders:[facility] T,Th,S 4 hrs 180NRe 350/800 2.0 K/3.0 Ca L AVF -Heparin 3000 units IV TIW -Calcitriol 0.25 mcg PO TIW     Assessment/Plan: 1.  Volume overload-agree-multifactorial. Did miss HD but has increased ascites. Needs both HD and paracentesis. HD today for max volume removal as tolerated. Paracentesis per primary 2. Non-alcoholic cirrhosis with H/O esophageal varices-per primary 3.  ESRD - T,Th, S via AVF. HD off schedule. Short HD 12/23 to get back on schedule.  4.  Hypertension/volume  - Other than ascites, he has no true evidence of volume overload by exam. BP elevated-probably due to lack of HD. Resume carvedilol 12.5 mg PO BID. Hold prior to HD on HD days.  5.  Anemia  - HGB 11.3. No ESA needed 6.  Metabolic bone disease -  Recent PO4 elevated. Not on binders yet. Start calcium acetate 667 mg PO TID AC. Continue VDRA..  7.  Nutrition - Albumin low in setting of cirrhosis. Protein supplements, renal vits.  8. H/O AAA 9. H/O Prostate cancer  Emmarie Sannes H. Owens Shark, NP-C 02/19/2020, 2:54 PM  [employer] [phone]

## 2020-02-19 NOTE — Procedures (Signed)
Patient seen and examined on Hemodialysis. BP (!) 175/87 (BP Location: Right Arm) Comment: post-paracentesis  Pulse 82   Temp 98.4 F (36.9 C) (Oral)   Resp (!) 21   Ht 6' (1.829 m)   Wt 73.1 kg   SpO2 96%   BMI 21.86 kg/m   QB 400 mL/min via AVF UF goal 3L  Tolerating treatment without complaints at this time.  Madelon Lips MD [facility] pgr [phone] 4:25 PM

## 2020-02-19 NOTE — Progress Notes (Signed)
[facility]                                                                              Patient Demographics  Ricky Harris, is a 79 y.o. male, DOB - 12/01/1940, ATF:[id number]  Admit date - 02/18/2020   Admitting Physician Ricky Reach, MD  Outpatient Primary MD for the patient is Ricky Mina., MD  Outpatient specialists:   LOS - 1  days   Medical records reviewed and are as summarized below:    Chief Complaint  Patient presents with  . Shortness of Breath  . Weakness  . Abdominal Pain       Brief summary   Patient is a 79 year old male with history of ESRD on HD TTS, nonalcoholic cirrhosis with significant ascites, aortic stenosis, prostate CA, anemia of chronic disease and AAA, hypertension, hypothyroidism presented to ED with shortness of breath, abdominal pain.  Patient recently had GI evaluation by Ricky Harris for esophageal varices 4 days prior to admission.  Patient had been feeling weak and some shortness of breath.  He presented to ED because of missed dialysis due to his symptoms.  Not hypoxic or any distress. 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  No fevers or chills. -Has mild abdominal pain, recent EGD on 12/17 had shown chronic gastritis, small esophageal varices with no bleeding     Essential hypertension -BP currently stable, continue Coreg     HLD (hyperlipidemia) -Continue statin      Hypothyroidism -Continue Synthroid     ESRD (end stage renal disease) ([facility]) -On HD TTS, nephrology following  Code Status: Full CODE STATUS DVT Prophylaxis:   SCD's Family Communication: Discussed all imaging results, lab results, explained to the patient's son, Ricky Harris at bed side    Disposition Plan:     Status is: Inpatient  Remains inpatient appropriate because:Inpatient level of care appropriate due to severity of illness   Dispo: The patient is from: Home              Anticipated d/c is to: Home              Anticipated d/c date is: 1 day              Patient currently is not medically stable to d/c.  Possible DC home in a.m.      Time Spent in minutes 35 minutes  Procedures:  Hemodialysis Ultrasound-guided paracentesis  Consultants:   Nephrology  Antimicrobials:   Anti-infectives (From admission, onward)   None          Medications  Scheduled Meds: . calcitRIOL  0.25 mcg Oral Q  T,Th,Sat-1800  . calcium acetate  667 mg Oral TID WC  . carvedilol  12.5 mg Oral BID WC  . feeding supplement (PROSource TF)  45 mL Per Tube BID  . lidocaine      . multivitamin  1 tablet Oral QHS  . sodium chloride flush  3 mL Intravenous Q12H   Continuous Infusions: . sodium chloride     PRN Meds:.sodium chloride, acetaminophen **OR** acetaminophen, calcium carbonate (dosed in mg elemental calcium), camphor-menthol **AND** hydrOXYzine, docusate sodium, feeding supplement (NEPRO CARB STEADY), lidocaine (PF), ondansetron **OR** ondansetron (ZOFRAN) IV, sodium chloride flush, sorbitol, zolpidem      Subjective:   Ricky Harris was seen and examined today.  Having mild abdominal pain, ascites.  No fevers or chills.  Son at the bedside, does not feel at baseline yet.  Constipated.  Patient denies dizziness, chest pain, shortness of breath,  new weakness, numbess, tingling. No acute events overnight.  No nausea vomiting.  Objective:   Vitals:   02/19/20 1350  02/19/20 1445 02/19/20 1537 02/19/20 1557  BP: (!) 163/79 (!) 145/90 (!) 175/87 (!) 175/87  Pulse: 85 82    Resp: (!) 21 (!) 21    Temp:  98.4 F (36.9 C)    TempSrc:  Oral    SpO2: 94% 96%    Weight:  73.1 kg    Height:        Intake/Output Summary (Last 24 hours) at 02/19/2020 1654 Last data filed at 02/19/2020 1445 Gross per 24 hour  Intake --  Output 2000 ml  Net -2000 ml     Wt Readings from Last 3 Encounters:  02/19/20 73.1 kg  02/14/20 72.6 kg  12/25/19 77.1 kg     Exam  General: Alert and oriented x 3, NAD  Cardiovascular: S1 S2 auscultated, no murmurs, RRR  Respiratory: Clear to auscultation bilaterally, no wheezing, rales or rhonchi  Gastrointestinal: Soft, mild lower abdominal TTP distended, + bowel sounds  Ext: no pedal edema bilaterally  Neuro: no new deficits  Musculoskeletal: No digital cyanosis, clubbing  Skin: No rashes  Psych: Normal affect and demeanor, alert and oriented x3    Data Reviewed:  I have personally reviewed following labs and imaging studies  Micro Results Recent Results (from the past 240 hour(s))  SARS CORONAVIRUS 2 (TAT 6-24 HRS) Nasopharyngeal Nasopharyngeal Swab     Status: None   Collection Time: 02/12/20 11:23 AM   Specimen: Nasopharyngeal Swab  Result Value Ref Range Status   SARS Coronavirus 2 NEGATIVE NEGATIVE Final    Comment: (NOTE) SARS-CoV-2 target nucleic acids are NOT DETECTED.  The SARS-CoV-2 RNA is generally detectable in upper and lower respiratory specimens during the acute phase of infection. Negative results do not preclude SARS-CoV-2 infection, do not rule out co-infections with other pathogens, and should not be used as the sole basis for treatment or other patient management decisions. Negative results must be combined with clinical observations, patient history, and epidemiological information. The expected result is Negative.  Fact Sheet for  Patients: SugarRoll.be  Fact Sheet for Healthcare Providers: https://www.woods-mathews.com/  This test is not yet approved or cleared by the [place] FDA and  has been authorized for detection and/or diagnosis of SARS-CoV-2 by FDA under an Emergency Use Authorization (EUA). This EUA will remain  in effect (meaning this test can be used) for the duration of the COVID-19 declaration under Se ction 564(b)(1) of the Act, 21 U.S.C. section 360bbb-3(b)(1), unless the authorization is terminated or revoked sooner.  Performed at [facility], [place] [address] [place], [place] [address]   Resp Panel by RT-PCR (Flu A&B, Covid) Nasopharyngeal Swab     Status: None   Collection Time: 02/18/20 12:35 PM   Specimen: Nasopharyngeal Swab; Nasopharyngeal(NP) swabs in vial transport medium  Result Value Ref Range Status   SARS Coronavirus 2 by RT PCR NEGATIVE NEGATIVE Final    Comment: (NOTE) SARS-CoV-2 target nucleic acids are NOT DETECTED.  The SARS-CoV-2 RNA is generally detectable in upper respiratory specimens during the acute phase of infection. The lowest concentration of SARS-CoV-2 viral copies this assay can detect is 138 copies/mL. A negative result does not preclude SARS-Cov-2 infection and should not be used as the sole basis for treatment or other patient management decisions. A negative result may occur with  improper specimen collection/handling, submission of specimen other than nasopharyngeal swab, presence of viral mutation(s) within the areas targeted by this assay, and inadequate number of viral copies(<138 copies/mL). A negative result must be combined with clinical observations, patient history, and epidemiological information. The expected result is Negative.  Fact Sheet for Patients:  EntrepreneurPulse.com.au  Fact Sheet for Healthcare Providers:  IncredibleEmployment.be  This test is  no t yet approved or cleared by the [place] FDA and  has been authorized for detection and/or diagnosis of SARS-CoV-2 by FDA under an Emergency Use Authorization (EUA). This EUA will remain  in effect (meaning this test can be used) for the duration of the COVID-19 declaration under Section 564(b)(1) of the Act, 21 U.S.C.section 360bbb-3(b)(1), unless the authorization is terminated  or revoked sooner.       Influenza A by PCR NEGATIVE NEGATIVE Final   Influenza B by PCR NEGATIVE NEGATIVE Final    Comment: (NOTE) The Xpert Xpress SARS-CoV-2/FLU/RSV plus assay is intended as an aid in the diagnosis of influenza from Nasopharyngeal swab specimens and should not be used as a sole basis for treatment. Nasal washings and aspirates are unacceptable for Xpert Xpress SARS-CoV-2/FLU/RSV testing.  Fact Sheet for Patients: EntrepreneurPulse.com.au  Fact Sheet for Healthcare Providers: IncredibleEmployment.be  This test is not yet approved or cleared by the [place] FDA and has been authorized for detection and/or diagnosis of SARS-CoV-2 by FDA under an Emergency Use Authorization (EUA). This EUA will remain in effect (meaning this test can be used) for the duration of the COVID-19 declaration under Section 564(b)(1) of the Act, 21 U.S.C. section 360bbb-3(b)(1), unless the authorization is terminated or revoked.  Performed at [facility], [place] [address] [place],  [address]     Radiology Reports CT ABDOMEN PELVIS WO CONTRAST  Result Date: 02/18/2020 CLINICAL DATA:  Abdominal pain and distention after recent EGD. History of cirrhosis. EXAM: CT ABDOMEN AND PELVIS WITHOUT CONTRAST TECHNIQUE: Multidetector CT imaging of the abdomen and pelvis was performed following the standard protocol without IV contrast. COMPARISON:  CT abdomen pelvis dated November 15, 2019. FINDINGS: Lower chest: No acute abnormality.  Subsegmental atelectasis in both lower lobes. Elevated left hemidiaphragm. Hepatobiliary: Unchanged cirrhotic appearing liver. No focal liver abnormality. Status post cholecystectomy. No biliary dilatation. Pancreas: Atrophic. No ductal dilatation or surrounding inflammatory changes. Spleen: Unchanged mild splenomegaly.  No focal abnormality. Adrenals/Urinary Tract: The adrenal glands are unremarkable. Unchanged bilateral renal atrophy and cysts. Unchanged punctate calculus in the lower pole the right kidney. No hydronephrosis. The bladder is unremarkable. Stomach/Bowel: Stomach is within normal limits. No evidence of bowel wall thickening, distention, or inflammatory changes. Moderate left-sided colonic diverticulosis. Diminutive or absent  appendix. Vascular/Lymphatic: Unchanged 5.5 cm infrarenal abdominal aortic aneurysm status post stent graft repair. Aortoiliac atherosclerotic vascular disease. No enlarged abdominal or pelvic lymph nodes. Reproductive: Prior prostatectomy. 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 Other: Moderate ascites, decreased when compared to prior study. No pneumoperitoneum. Musculoskeletal: No acute or significant osseous findings. IMPRESSION: 1. No acute intra-abdominal process. 2. Moderate ascites, decreased when compared to prior study. 3. Unchanged cirrhosis and mild splenomegaly. 4. Unchanged punctate right nephrolithiasis. 5. Aortic Atherosclerosis (ICD10-I70.0). Electronically Signed   By: Titus Dubin M.D.   On: 02/18/2020 17:35   DG Chest Portable 1 View  Result Date: 02/18/2020 CLINICAL DATA:  This dialysis, shortness of breath and nonproductive cough and weakness EXAM: PORTABLE CHEST 1 VIEW COMPARISON:  October 20th of 2021. FINDINGS: Trachea midline. Cardiomediastinal contours and hilar structures with stable central pulmonary vascular engorgement and obscured LEFT cardiac border due to elevated LEFT hemidiaphragm. No sign of lobar  consolidative changes. Added density behind the heart on today's study and in the LEFT lower chest. On limited assessment no acute musculoskeletal process. IMPRESSION: Pulmonary vascular engorgement without frank pulmonary edema. Increased density in the retrocardiac region may represent volume loss in the setting of elevated LEFT hemidiaphragm. Difficult to exclude infection in this location. Electronically Signed   By: Zetta Bills M.D.   On: 02/18/2020 13:04   IR Paracentesis  Result Date: 02/19/2020 INDICATION: Patient with history of NASH cirrhosis, ESRD on HD, abdominal distension, recurrent ascites. Request made for diagnostic and therapeutic paracentesis up to 6 L. EXAM: ULTRASOUND GUIDED DIAGNOSTIC AND THERAPEUTIC PARACENTESIS MEDICATIONS: 10 mL 1% lidocaine COMPLICATIONS: None immediate. PROCEDURE: Informed written consent was obtained from the patient after a discussion of the risks, benefits and alternatives to treatment. A timeout was performed prior to the initiation of the procedure. Initial ultrasound scanning demonstrates a large amount of ascites within the right lower abdominal quadrant. The right lower abdomen was prepped and draped in the usual sterile fashion. 1% lidocaine was used for local anesthesia. Following this, a 19 gauge, 7-cm, Yueh catheter was introduced. An ultrasound image was saved for documentation purposes. The paracentesis was performed. The catheter was removed and a dressing was applied. The patient tolerated the procedure well without immediate post procedural complication. FINDINGS: A total of approximately 2.2 L of clear yellow fluid was removed. Samples were sent to the laboratory as requested by the clinical team. IMPRESSION: Successful ultrasound-guided paracentesis yielding 2.2 L of peritoneal fluid. Read by: Earley Abide, PA-C Electronically Signed   By: Sandi Mariscal M.D.   On: 02/19/2020 16:12   IR Paracentesis  Result Date: 01/27/2020 INDICATION:  Patient with a history of cirrhosis with recurrent ascites and end-stage renal disease on hemodialysis. Interventional radiology asked to perform a therapeutic paracentesis. EXAM: ULTRASOUND GUIDED PARACENTESIS MEDICATIONS: 1% lidocaine 10 mL COMPLICATIONS: None immediate. PROCEDURE: Informed written consent was obtained from the patient after a discussion of the risks, benefits and alternatives to treatment. A timeout was performed prior to the initiation of the procedure. Initial ultrasound scanning demonstrates a large amount of ascites within the right lower abdominal quadrant. The right lower abdomen was prepped and draped in the usual sterile fashion. 1% lidocaine was used for local anesthesia. Following this, a 19 gauge, 7-cm, Yueh catheter was introduced. An ultrasound image was saved for documentation purposes. The paracentesis was performed. The catheter was removed and a dressing was applied. The patient tolerated the procedure well without immediate post procedural complication. FINDINGS: A total of  approximately 3.2 L of clear yellow fluid was removed. IMPRESSION: Successful ultrasound-guided paracentesis yielding 3.2 liters of peritoneal fluid. Read by: Soyla Dryer, NP Electronically Signed   By: Jerilynn Mages.  Shick M.D.   On: 01/27/2020 10:58    Lab Data:  CBC: Recent Labs  Lab 02/14/20 0832 02/18/20 1142 02/19/20 0214  WBC  --  6.6 5.7  HGB 12.6* 12.2* 11.3*  HCT 37.0* 37.2* 33.4*  MCV  --  100.0 98.2  PLT  --  108* 99*   Basic Metabolic Panel: Recent Labs  Lab 02/14/20 0832 02/18/20 1142 02/19/20 0214  NA 138 135 135  K 3.9 4.1 4.1  CL 99 97* 98  CO2  --  25 22  GLUCOSE 91 96 99  BUN 40* 96* 101*  CREATININE 5.30* 8.02* 8.73*  CALCIUM  --  8.6* 8.1*  PHOS  --   --  5.0*   GFR: Estimated Creatinine Clearance: 7.3 mL/min (A) (by C-G formula based on SCr of 8.73 mg/dL (H)). Liver Function Tests: Recent Labs  Lab 02/18/20 1223 02/19/20 0214  AST 85*  --   ALT 58*  --    ALKPHOS 248*  --   BILITOT 0.8  --   PROT 6.8  --   ALBUMIN 3.3* 2.7*   Recent Labs  Lab 02/18/20 1223  LIPASE 46   No results for input(s): AMMONIA in the last 168 hours. Coagulation Profile: No results for input(s): INR, PROTIME in the last 168 hours. Cardiac Enzymes: No results for input(s): CKTOTAL, CKMB, CKMBINDEX, TROPONINI in the last 168 hours. BNP (last 3 results) No results for input(s): PROBNP in the last 8760 hours. HbA1C: No results for input(s): HGBA1C in the last 72 hours. CBG: No results for input(s): GLUCAP in the last 168 hours. Lipid Profile: No results for input(s): CHOL, HDL, LDLCALC, TRIG, CHOLHDL, LDLDIRECT in the last 72 hours. Thyroid Function Tests: No results for input(s): TSH, T4TOTAL, FREET4, T3FREE, THYROIDAB in the last 72 hours. Anemia Panel: No results for input(s): VITAMINB12, FOLATE, FERRITIN, TIBC, IRON, RETICCTPCT in the last 72 hours. Urine analysis:    Component Value Date/Time   COLORURINE YELLOW 01/18/2019 1230   APPEARANCEUR CLEAR 01/18/2019 1230   LABSPEC 1.017 01/18/2019 1230   PHURINE 5.0 01/18/2019 1230   GLUCOSEU NEGATIVE 01/18/2019 1230   HGBUR NEGATIVE 01/18/2019 1230   BILIRUBINUR NEGATIVE 01/18/2019 Media 01/18/2019 1230   PROTEINUR 100 (A) 01/18/2019 1230   NITRITE NEGATIVE 01/18/2019 1230   LEUKOCYTESUR NEGATIVE 01/18/2019 1230     Talajah Slimp M.D. [facility] 02/19/2020, 4:54 PM   Call night coverage person covering after 7pm

## 2020-02-19 NOTE — Procedures (Signed)
PROCEDURE SUMMARY:  Successful image-guided paracentesis from the right lateral abdomen.  Yielded 2.2 liters of clear yellow fluid.  No immediate complications.  EBL = 0 mL. Patient tolerated well.   Specimen was sent for labs.  Please see imaging section of Epic for full dictation.   Earley Abide PA-C 02/19/2020 3:39 PM

## 2020-02-20 LAB — COMPREHENSIVE METABOLIC PANEL
ALT: 41 U/L (ref 0–44)
AST: 43 U/L — ABNORMAL HIGH (ref 15–41)
Albumin: 2.7 g/dL — ABNORMAL LOW (ref 3.5–5.0)
Alkaline Phosphatase: 227 U/L — ABNORMAL HIGH (ref 38–126)
Anion gap: 14 (ref 5–15)
BUN: 41 mg/dL — ABNORMAL HIGH (ref 8–23)
CO2: 28 mmol/L (ref 22–32)
Calcium: 8.9 mg/dL (ref 8.9–10.3)
Chloride: 97 mmol/L — ABNORMAL LOW (ref 98–111)
Creatinine, Ser: 5.43 mg/dL — ABNORMAL HIGH (ref 0.61–1.24)
GFR, Estimated: 10 mL/min — ABNORMAL LOW (ref >60)
Glucose, Bld: 98 mg/dL (ref 70–99)
Potassium: 3.7 mmol/L (ref 3.5–5.1)
Sodium: 139 mmol/L (ref 135–145)
Total Bilirubin: 1.2 mg/dL (ref 0.3–1.2)
Total Protein: 6 g/dL — ABNORMAL LOW (ref 6.5–8.1)

## 2020-02-20 LAB — CBC
HCT: 36 % — ABNORMAL LOW (ref 39.0–52.0)
Hemoglobin: 11.7 g/dL — ABNORMAL LOW (ref 13.0–17.0)
MCH: 32.1 pg (ref 26.0–34.0)
MCHC: 32.5 g/dL (ref 30.0–36.0)
MCV: 98.9 fL (ref 80.0–100.0)
Platelets: 101 10*3/uL — ABNORMAL LOW (ref 150–400)
RBC: 3.64 MIL/uL — ABNORMAL LOW (ref 4.22–5.81)
RDW: 16 % — ABNORMAL HIGH (ref 11.5–15.5)
WBC: 5.3 10*3/uL (ref 4.0–10.5)
nRBC: 0 % (ref 0.0–0.2)

## 2020-02-20 LAB — CYTOLOGY - NON PAP

## 2020-02-20 MED ORDER — SODIUM CHLORIDE 0.9 % IV SOLN: 100.0000 mL | INTRAVENOUS | Status: DC | PRN

## 2020-02-20 MED ORDER — HEPARIN SODIUM (PORCINE) 1000 UNIT/ML IJ SOLN
INTRAMUSCULAR | Status: AC
  Filled 2020-02-20: qty 3

## 2020-02-20 MED ORDER — HEPARIN SODIUM (PORCINE) 1000 UNIT/ML DIALYSIS: 3000.0000 [IU] | INTRAMUSCULAR | Status: DC | PRN

## 2020-02-20 MED ORDER — CALCITRIOL 0.25 MCG PO CAPS
ORAL_CAPSULE | ORAL | Status: AC
  Administered 2020-02-20: 0.25 ug
  Filled 2020-02-20: qty 1

## 2020-02-20 MED ORDER — FEBUXOSTAT 40 MG PO TABS: 40.0000 mg | ORAL_TABLET | Freq: Every day | ORAL | 3 refills | Status: DC

## 2020-02-20 MED ORDER — PANTOPRAZOLE SODIUM 40 MG PO TBEC: 40.0000 mg | DELAYED_RELEASE_TABLET | Freq: Every day | ORAL | 1 refills | Status: DC

## 2020-02-20 MED ORDER — LIDOCAINE-PRILOCAINE 2.5-2.5 % EX CREA: 1.0000 "application " | TOPICAL_CREAM | CUTANEOUS | Status: DC | PRN

## 2020-02-20 MED ORDER — LACTULOSE 10 GM/15ML PO SOLN: 10.0000 g | Freq: Every day | ORAL | 1 refills | Status: DC

## 2020-02-20 MED ORDER — PENTAFLUOROPROP-TETRAFLUOROETH EX AERO: 1.0000 "application " | INHALATION_SPRAY | CUTANEOUS | Status: DC | PRN

## 2020-02-20 MED ORDER — CALCIUM ACETATE (PHOS BINDER) 667 MG PO CAPS: 667.0000 mg | ORAL_CAPSULE | Freq: Three times a day (TID) | ORAL | 2 refills | Status: DC

## 2020-02-20 MED ORDER — LIDOCAINE HCL (PF) 1 % IJ SOLN: 5.0000 mL | INTRAMUSCULAR | Status: DC | PRN

## 2020-02-20 MED ORDER — ALTEPLASE 2 MG IJ SOLR: 2.0000 mg | Freq: Once | INTRAMUSCULAR | Status: DC | PRN

## 2020-02-20 MED ORDER — HEPARIN SODIUM (PORCINE) 1000 UNIT/ML DIALYSIS
3000.0000 [IU] | INTRAMUSCULAR | Status: DC | PRN
  Administered 2020-02-20: 3000 [IU] via INTRAVENOUS_CENTRAL

## 2020-02-20 MED ORDER — CALCITRIOL 0.25 MCG PO CAPS: 0.2500 ug | ORAL_CAPSULE | ORAL | 3 refills | Status: DC

## 2020-02-20 MED ORDER — HEPARIN SODIUM (PORCINE) 1000 UNIT/ML DIALYSIS: 1000.0000 [IU] | INTRAMUSCULAR | Status: DC | PRN

## 2020-02-20 NOTE — Discharge Summary (Signed)
Physician Discharge Summary   Patient ID: Ricky Harris MRN: [id number] DOB/AGE: 08-31-1940 79 y.o.  Admit date: 02/18/2020 Discharge date: 02/20/2020  Primary Care Physician:  [name]., MD   Recommendations for Outpatient Follow-up:  1. Follow up with PCP in 1-2 weeks  Home Health: None Equipment/Devices:   Discharge Condition: stable  CODE STATUS: FULL  Diet recommendation: Renal diet   Discharge Diagnoses:    Dyspnea likely due to fluid overload from missed HD, ascites . AAA (abdominal aortic aneurysm) without rupture ([place]) . Ascites status post paracentesis . Essential hypertension . HLD (hyperlipidemia) . Hypothyroidism . 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 Cephalexin Hives  . Hydralazine Hives     DISCHARGE MEDICATIONS: Allergies as of 02/20/2020      Reactions   Cephalexin Hives   Hydralazine Hives      Medication List    STOP taking these medications   sodium bicarbonate 650 MG tablet     TAKE these medications   atorvastatin 10 MG tablet Commonly known as: LIPITOR Take 10 mg by mouth at bedtime.   calcitRIOL 0.25 MCG capsule Commonly known as: ROCALTROL Take 1 capsule (0.25 mcg total) by mouth every Tuesday, Thursday, and Saturday at 6 PM.   calcium acetate 667 MG capsule Commonly known as: PHOSLO Take 1 capsule (667 mg total) by mouth 3 (three) times daily with meals.   carvedilol 12.5 MG tablet Commonly known as: COREG Take 12.5 mg by mouth 2 (two) times daily.   cloNIDine 0.1 MG tablet Commonly known as: CATAPRES Take 1 tablet (0.1 mg total) by mouth daily. What changed: how much to take   colchicine 0.6 MG tablet Take 0.6 mg by mouth daily as needed (gout flare).   febuxostat 40 MG tablet Commonly known as: ULORIC Take 1 tablet (40 mg total) by mouth daily.   lactulose 10 GM/15ML solution Commonly known as:  CHRONULAC Take 15 mLs (10 g total) by mouth daily. What changed:   how much to take  when to take this  reasons to take this   levothyroxine 25 MCG tablet Commonly known as: SYNTHROID Take 25 mcg by mouth daily.   multivitamin Tabs tablet Take 1 tablet by mouth daily.   traMADol 50 MG tablet Commonly known as: ULTRAM Take 1 tablet (50 mg total) by mouth every 8 (eight) hours as needed for moderate pain.            Discharge Care Instructions  (From admission, onward)         Start     Ordered   02/20/20 0000  No dressing needed        02/20/20 0953           Brief H and P: For complete details please refer to admission H and P, but in brief *Patient is a 79 year old male with history of ESRD on HD TTS, nonalcoholic cirrhosis with significant ascites, aortic stenosis, prostate CA, anemia of chronic disease and AAA, hypertension, hypothyroidism presented to ED with shortness of breath, abdominal pain.  Patient recently had GI evaluation by Dr. Alessandra Bevels for esophageal varices 4 days prior to admission.  Patient had been feeling weak and some shortness of breath.  He presented to ED because of missed dialysis due to his symptoms.  Not hypoxic or any distress. Patient was admitted for further work-up   Hospital Course:  Dyspnea likely due to fluid overload from missed HD, in the setting of ESRD on HD TTS -Nephrology was consulted, patient underwent serial back-to-back hemodialysis x2 on 02/19/2020 and 02/20/2020 prior to discharge -He will resume his regular HD schedule, currently off due to holidays Patient however feels close to his baseline now. Cleared by nephrology to discharge home after hemodialysis.  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  Patient does not have any  abdominal pain, fevers or chills.  Chronic gastritis/GERD -recent EGD on 12/17 had shown chronic gastritis, small esophageal varices with no bleeding -will place on Protonix 40 mg daily Outpatient follow-up with GI    Essential hypertension -BP currently stable, continue Coreg     HLD (hyperlipidemia) -Continue statin     Hypothyroidism -Continue Synthroid     ESRD (end stage renal disease) ([place]) -On HD TTS, resumed HD   Day of Discharge S: Currently no acute complaints.  Seen at hemodialysis today.  Feels a whole lot better today.  No nausea vomiting abdominal pain.  BP 110/61   Pulse 85   Temp 99 F (37.2 C) (Oral)   Resp 19   Ht 6' (1.829 m)   Wt 70.4 kg   SpO2 95%   BMI 21.05 kg/m   Physical Exam: General: Alert and awake oriented x3 not in any acute distress. HEENT: anicteric sclera, pupils reactive to light and accommodation CVS: S1-S2 clear no murmur rubs or gallops Chest: clear to auscultation bilaterally, no wheezing rales or rhonchi Abdomen: soft nontender, nondistended, normal bowel sounds Extremities: no cyanosis, clubbing or edema noted bilaterally Neuro: Cranial nerves II-XII intact, no focal neurological deficits    Get Medicines reviewed and adjusted: Please take all your medications with you for your next visit with your Primary MD  Please request your Primary MD to go over all hospital tests and procedure/radiological results at the follow up. Please ask your Primary MD to get all Hospital records sent to his/her office.  If you experience worsening of your admission symptoms, develop shortness of breath, life threatening emergency, suicidal or homicidal thoughts you must seek medical attention immediately by calling 911 or calling your MD immediately  if symptoms less severe.  You must read complete instructions/literature along with all the possible adverse reactions/side effects for all the Medicines you take and that have been  prescribed to you. Take any new Medicines after you have completely understood and accept all the possible adverse reactions/side effects.   Do not drive when taking pain medications.   Do not take more than prescribed Pain, Sleep and Anxiety Medications  Special Instructions: If you have smoked or chewed Tobacco  in the last 2 yrs please stop smoking, stop any regular Alcohol  and or any Recreational drug use.  Wear Seat belts while driving.  Please note  You were cared for by a hospitalist during your hospital stay. Once you are discharged, your primary care physician will handle any further medical issues. Please note that NO REFILLS for any discharge medications will be authorized once you are discharged, as it is imperative that you return to your primary care physician (or establish a relationship with a primary care physician if you do not have one) for your aftercare needs so that they can reassess your need for medications and monitor your lab values.   The  results of significant diagnostics from this hospitalization (including imaging, microbiology, ancillary and laboratory) are listed below for reference.      Procedures/Studies:  CT ABDOMEN PELVIS WO CONTRAST  Result Date: 02/18/2020 CLINICAL DATA:  Abdominal pain and distention after recent EGD. History of cirrhosis. EXAM: CT ABDOMEN AND PELVIS WITHOUT CONTRAST TECHNIQUE: Multidetector CT imaging of the abdomen and pelvis was performed following the standard protocol without IV contrast. COMPARISON:  CT abdomen pelvis dated November 15, 2019. FINDINGS: Lower chest: No acute abnormality. Subsegmental atelectasis in both lower lobes. Elevated left hemidiaphragm. Hepatobiliary: Unchanged cirrhotic appearing liver. No focal liver abnormality. Status post cholecystectomy. No biliary dilatation. Pancreas: Atrophic. No ductal dilatation or surrounding inflammatory changes. Spleen: Unchanged mild splenomegaly.  No focal abnormality.  Adrenals/Urinary Tract: The adrenal glands are unremarkable. Unchanged bilateral renal atrophy and cysts. Unchanged punctate calculus in the lower pole the right kidney. No hydronephrosis. The bladder is unremarkable. Stomach/Bowel: Stomach is within normal limits. No evidence of bowel wall thickening, distention, or inflammatory changes. Moderate left-sided colonic diverticulosis. Diminutive or absent appendix. Vascular/Lymphatic: Unchanged 5.5 cm infrarenal abdominal aortic aneurysm status post stent graft repair. Aortoiliac atherosclerotic vascular disease. No enlarged abdominal or pelvic lymph nodes. Reproductive: Prior prostatectomy. 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 Other: Moderate ascites, decreased when compared to prior study. No pneumoperitoneum. Musculoskeletal: No acute or significant osseous findings. IMPRESSION: 1. No acute intra-abdominal process. 2. Moderate ascites, decreased when compared to prior study. 3. Unchanged cirrhosis and mild splenomegaly. 4. Unchanged punctate right nephrolithiasis. 5. Aortic Atherosclerosis (ICD10-I70.0). Electronically Signed   By: Titus Dubin M.D.   On: 02/18/2020 17:35   DG Chest Portable 1 View  Result Date: 02/18/2020 CLINICAL DATA:  This dialysis, shortness of breath and nonproductive cough and weakness EXAM: PORTABLE CHEST 1 VIEW COMPARISON:  October 20th of 2021. FINDINGS: Trachea midline. Cardiomediastinal contours and hilar structures with stable central pulmonary vascular engorgement and obscured LEFT cardiac border due to elevated LEFT hemidiaphragm. No sign of lobar consolidative changes. Added density behind the heart on today's study and in the LEFT lower chest. On limited assessment no acute musculoskeletal process. IMPRESSION: Pulmonary vascular engorgement without frank pulmonary edema. Increased density in the retrocardiac region may represent volume loss in the setting of elevated LEFT hemidiaphragm.  Difficult to exclude infection in this location. Electronically Signed   By: Zetta Bills M.D.   On: 02/18/2020 13:04   IR Paracentesis  Result Date: 02/19/2020 INDICATION: Patient with history of NASH cirrhosis, ESRD on HD, abdominal distension, recurrent ascites. Request made for diagnostic and therapeutic paracentesis up to 6 L. EXAM: ULTRASOUND GUIDED DIAGNOSTIC AND THERAPEUTIC PARACENTESIS MEDICATIONS: 10 mL 1% lidocaine COMPLICATIONS: None immediate. PROCEDURE: Informed written consent was obtained from the patient after a discussion of the risks, benefits and alternatives to treatment. A timeout was performed prior to the initiation of the procedure. Initial ultrasound scanning demonstrates a large amount of ascites within the right lower abdominal quadrant. The right lower abdomen was prepped and draped in the usual sterile fashion. 1% lidocaine was used for local anesthesia. Following this, a 19 gauge, 7-cm, Yueh catheter was introduced. An ultrasound image was saved for documentation purposes. The paracentesis was performed. The catheter was removed and a dressing was applied. The patient tolerated the procedure well without immediate post procedural complication. FINDINGS: A total of approximately 2.2 L of clear yellow fluid was removed. Samples were sent to the laboratory as requested by the clinical team. IMPRESSION: Successful ultrasound-guided paracentesis yielding  2.2 L of peritoneal fluid. Read by: Earley Abide, PA-C Electronically Signed   By: Sandi Mariscal M.D.   On: 02/19/2020 16:12   IR Paracentesis  Result Date: 01/27/2020 INDICATION: Patient with a history of cirrhosis with recurrent ascites and end-stage renal disease on hemodialysis. Interventional radiology asked to perform a therapeutic paracentesis. EXAM: ULTRASOUND GUIDED PARACENTESIS MEDICATIONS: 1% lidocaine 10 mL COMPLICATIONS: None immediate. PROCEDURE: Informed written consent was obtained from the patient after a  discussion of the risks, benefits and alternatives to treatment. A timeout was performed prior to the initiation of the procedure. Initial ultrasound scanning demonstrates a large amount of ascites within the right lower abdominal quadrant. The right lower abdomen was prepped and draped in the usual sterile fashion. 1% lidocaine was used for local anesthesia. Following this, a 19 gauge, 7-cm, Yueh catheter was introduced. An ultrasound image was saved for documentation purposes. The paracentesis was performed. The catheter was removed and a dressing was applied. The patient tolerated the procedure well without immediate post procedural complication. FINDINGS: A total of approximately 3.2 L of clear yellow fluid was removed. IMPRESSION: Successful ultrasound-guided paracentesis yielding 3.2 liters of peritoneal fluid. Read by: Soyla Dryer, NP Electronically Signed   By: Jerilynn Mages.  Shick M.D.   On: 01/27/2020 10:58       LAB RESULTS: Basic Metabolic Panel: Recent Labs  Lab 02/19/20 0214 02/20/20 0227  NA 135 139  K 4.1 3.7  CL 98 97*  CO2 22 28  GLUCOSE 99 98  BUN 101* 41*  CREATININE 8.73* 5.43*  CALCIUM 8.1* 8.9  PHOS 5.0*  --    Liver Function Tests: Recent Labs  Lab 02/18/20 1223 02/19/20 0214 02/20/20 0227  AST 85*  --  43*  ALT 58*  --  41  ALKPHOS 248*  --  227*  BILITOT 0.8  --  1.2  PROT 6.8  --  6.0*  ALBUMIN 3.3* 2.7* 2.7*   Recent Labs  Lab 02/18/20 1223  LIPASE 46   No results for input(s): AMMONIA in the last 168 hours. CBC: Recent Labs  Lab 02/19/20 0214 02/20/20 0227  WBC 5.7 5.3  HGB 11.3* 11.7*  HCT 33.4* 36.0*  MCV 98.2 98.9  PLT 99* 101*   Cardiac Enzymes: No results for input(s): CKTOTAL, CKMB, CKMBINDEX, TROPONINI in the last 168 hours. BNP: Invalid input(s): POCBNP CBG: No results for input(s): GLUCAP in the last 168 hours.     Disposition and Follow-up: Discharge Instructions    Diet - low sodium heart healthy   Complete by: As  directed    Increase activity slowly   Complete by: As directed    No dressing needed   Complete by: As directed        DISPOSITION: Home   DISCHARGE FOLLOW-UP  Follow-up Information    [name]., MD. Schedule an appointment as soon as possible for a visit in 2 week(s).   Specialty: Internal Medicine Contact information: [place] [address] [phone]                Time coordinating discharge:  35 mins  Signed:   Estill Cotta M.D. [facility] 02/20/2020, 11:31 AM

## 2020-02-20 NOTE — Procedures (Signed)
Patient seen and examined on Hemodialysis. BP 136/71   Pulse 79   Temp 98 F (36.7 C) (Oral)   Resp 15   Ht 6' (1.829 m)   Wt 67.9 kg   SpO2 95%   BMI 20.30 kg/m   QB 400 mL/ min via AVF UF goal 2L  Tolerating treatment without complaints at this time.   Madelon Lips MD [facility] pgr [phone] 1:25 PM

## 2020-02-20 NOTE — Progress Notes (Addendum)
Ricky Harris Progress Note   Subjective: Seen at start of HD.  Net UF with HD 2 Liters, paracentesis done with net 2.2 L done 02/19/2020. He looks and feel much better.   Objective Vitals:   02/19/20 1722 02/19/20 2225 02/20/20 0010 02/20/20 0455  BP: (!) 164/69 (!) 160/89 (!) 143/74 (!) 154/77  Pulse:  81 72 78  Resp:  12 17 (!) 23  Temp: 99.4 F (37.4 C)  98.9 F (37.2 C) 99.3 F (37.4 C)  TempSrc: Oral  Oral Oral  SpO2:  98% 96% 99%  Weight:    70.4 kg  Height:       Physical Exam General: Chronically ill appearing pleasant elderly male in NAD Heart: S1,S2 RRR No R/M/G Lungs: CTAB  Abdomen: soft, still with ascites present but much improved Extremities: No LE edema.  Dialysis Access: L AVF+T/B    Additional Objective Labs: Basic Metabolic Panel: Recent Labs  Lab 02/18/20 1142 02/19/20 0214 02/20/20 0227  NA 135 135 139  K 4.1 4.1 3.7  CL 97* 98 97*  CO2 25 22 28   GLUCOSE 96 99 98  BUN 96* 101* 41*  CREATININE 8.02* 8.73* 5.43*  CALCIUM 8.6* 8.1* 8.9  PHOS  --  5.0*  --    Liver Function Tests: Recent Labs  Lab 02/18/20 1223 02/19/20 0214 02/20/20 0227  AST 85*  --  43*  ALT 58*  --  41  ALKPHOS 248*  --  227*  BILITOT 0.8  --  1.2  PROT 6.8  --  6.0*  ALBUMIN 3.3* 2.7* 2.7*   Recent Labs  Lab 02/18/20 1223  LIPASE 46   CBC: Recent Labs  Lab 02/18/20 1142 02/19/20 0214 02/20/20 0227  WBC 6.6 5.7 5.3  HGB 12.2* 11.3* 11.7*  HCT 37.2* 33.4* 36.0*  MCV 100.0 98.2 98.9  PLT 108* 99* 101*   Blood Culture    Component Value Date/Time   SDES PERITONEAL FLUID 02/19/2020 1603   SPECREQUEST ABDOMEN 02/19/2020 1603   CULT  12/05/2018 0922    NO GROWTH 3 DAYS Performed at [facility], [place] [address] [place],  [address]    REPTSTATUS 02/19/2020 FINAL 02/19/2020 1603    Cardiac Enzymes: No results for input(s): CKTOTAL, CKMB, CKMBINDEX, TROPONINI in the last 168 hours. CBG: No results for input(s): GLUCAP  in the last 168 hours. Iron Studies: No results for input(s): IRON, TIBC, TRANSFERRIN, FERRITIN in the last 72 hours. @lablastinr3 @ Studies/Results: CT ABDOMEN PELVIS WO CONTRAST  Result Date: 02/18/2020 CLINICAL DATA:  Abdominal pain and distention after recent EGD. History of cirrhosis. EXAM: CT ABDOMEN AND PELVIS WITHOUT CONTRAST TECHNIQUE: Multidetector CT imaging of the abdomen and pelvis was performed following the standard protocol without IV contrast. COMPARISON:  CT abdomen pelvis dated November 15, 2019. FINDINGS: Lower chest: No acute abnormality. Subsegmental atelectasis in both lower lobes. Elevated left hemidiaphragm. Hepatobiliary: Unchanged cirrhotic appearing liver. No focal liver abnormality. Status post cholecystectomy. No biliary dilatation. Pancreas: Atrophic. No ductal dilatation or surrounding inflammatory changes. Spleen: Unchanged mild splenomegaly.  No focal abnormality. Adrenals/Urinary Tract: The adrenal glands are unremarkable. Unchanged bilateral renal atrophy and cysts. Unchanged punctate calculus in the lower pole the right kidney. No hydronephrosis. The bladder is unremarkable. Stomach/Bowel: Stomach is within normal limits. No evidence of bowel wall thickening, distention, or inflammatory changes. Moderate left-sided colonic diverticulosis. Diminutive or absent appendix. Vascular/Lymphatic: Unchanged 5.5 cm infrarenal abdominal aortic aneurysm status post stent graft repair. Aortoiliac atherosclerotic vascular disease. No  enlarged abdominal or pelvic lymph nodes. Reproductive: Prior prostatectomy. 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 Other: Moderate ascites, decreased when compared to prior study. No pneumoperitoneum. Musculoskeletal: No acute or significant osseous findings. IMPRESSION: 1. No acute intra-abdominal process. 2. Moderate ascites, decreased when compared to prior study. 3. Unchanged cirrhosis and mild splenomegaly. 4.  Unchanged punctate right nephrolithiasis. 5. Aortic Atherosclerosis (ICD10-I70.0). Electronically Signed   By: Titus Dubin M.D.   On: 02/18/2020 17:35   DG Chest Portable 1 View  Result Date: 02/18/2020 CLINICAL DATA:  This dialysis, shortness of breath and nonproductive cough and weakness EXAM: PORTABLE CHEST 1 VIEW COMPARISON:  October 20th of 2021. FINDINGS: Trachea midline. Cardiomediastinal contours and hilar structures with stable central pulmonary vascular engorgement and obscured LEFT cardiac border due to elevated LEFT hemidiaphragm. No sign of lobar consolidative changes. Added density behind the heart on today's study and in the LEFT lower chest. On limited assessment no acute musculoskeletal process. IMPRESSION: Pulmonary vascular engorgement without frank pulmonary edema. Increased density in the retrocardiac region may represent volume loss in the setting of elevated LEFT hemidiaphragm. Difficult to exclude infection in this location. Electronically Signed   By: Zetta Bills M.D.   On: 02/18/2020 13:04   IR Paracentesis  Result Date: 02/19/2020 INDICATION: Patient with history of NASH cirrhosis, ESRD on HD, abdominal distension, recurrent ascites. Request made for diagnostic and therapeutic paracentesis up to 6 L. EXAM: ULTRASOUND GUIDED DIAGNOSTIC AND THERAPEUTIC PARACENTESIS MEDICATIONS: 10 mL 1% lidocaine COMPLICATIONS: None immediate. PROCEDURE: Informed written consent was obtained from the patient after a discussion of the risks, benefits and alternatives to treatment. A timeout was performed prior to the initiation of the procedure. Initial ultrasound scanning demonstrates a large amount of ascites within the right lower abdominal quadrant. The right lower abdomen was prepped and draped in the usual sterile fashion. 1% lidocaine was used for local anesthesia. Following this, a 19 gauge, 7-cm, Yueh catheter was introduced. An ultrasound image was saved for documentation purposes.  The paracentesis was performed. The catheter was removed and a dressing was applied. The patient tolerated the procedure well without immediate post procedural complication. FINDINGS: A total of approximately 2.2 L of clear yellow fluid was removed. Samples were sent to the laboratory as requested by the clinical team. IMPRESSION: Successful ultrasound-guided paracentesis yielding 2.2 L of peritoneal fluid. Read by: Earley Abide, PA-C Electronically Signed   By: Sandi Mariscal M.D.   On: 02/19/2020 16:12   Medications: . sodium chloride     . atorvastatin  10 mg Oral QHS  . calcitRIOL  0.25 mcg Oral Q T,Th,Sat-1800  . calcium acetate  667 mg Oral TID WC  . carvedilol  12.5 mg Oral BID WC  . feeding supplement (PROSource TF)  45 mL Per Tube BID  . levothyroxine  25 mcg Oral Daily  . multivitamin  1 tablet Oral QHS  . sodium chloride flush  3 mL Intravenous Q12H    Dialysis Orders:[place] T,Th,S 4 hrs 180NRe 350/800 2.0 K/3.0 Ca L AVF -Heparin 3000 units IV TIW -Calcitriol 0.25 mcg PO TIW  Assessment/Plan: 1.  Volume overload-multifactorial. Resolved with HD and paracentesis 02/19/2020 2. Non-alcoholic cirrhosis with H/O esophageal varices-per primary 3.  ESRD - T,Th, S via AVF. HD on schedule today. Labs not drawn yet.  4.  Hypertension/volume  - BP slightly improved, Resumed carvedilol 12.5 mg PO BID. Hold prior to HD on HD days. Euvolemic by exam.  5.  Anemia  -  HGB 11.3 12/22. No ESA needed. Labs pending. 6.  Metabolic bone disease -  Recent PO4 elevated. Not on binders yet. Start calcium acetate 667 mg PO TID AC. Continue VDRA..  7.  Nutrition - Albumin low in setting of cirrhosis. Protein supplements, renal vits.  8. H/O AAA 9. H/O Prostate cancer  Disposition: Stable for DC from nephrology standpoint. He will have next HD on Holiday Schedule 02/23/20 at OP center.   Denzel Etienne H. Lin Glazier NP-C 02/20/2020, 8:17 AM  [employer] [phone]

## 2020-02-20 NOTE — TOC Initial Note (Signed)
Transition of Care [facility]) - Initial/Assessment Note    Patient Details  Name: Ricky Harris MRN: [id number] Date of Birth: May 27, 1940  [facility] CM/SW Contact:    Bethena Roys, RN Phone Number: 02/20/2020, 12:03 PM  Clinical Narrative: High risk for readmission assessment completed. Case Manager discussed home health needs. Prior to arrival independent from home with support of son and his wife. Patient uses [facility] and gets medications without any issues. Patent goes to HD TTS schedule and has transportation. Discussed home health services and patient will not need any services at this time.                 Expected Discharge Plan: Home/Self Care Barriers to Discharge: No Barriers Identified   Patient Goals and CMS Choice Patient states their goals for this hospitalization and ongoing recovery are:: to return home with family support.   Choice offered to / list presented to : NA  Expected Discharge Plan and Services Expected Discharge Plan: Home/Self Care In-house Referral: NA Discharge Planning Services: CM Consult   Living arrangements for the past 2 months: Single Family Home Expected Discharge Date: 02/20/20               DME Arranged: N/A DME Agency: NA       HH Arranged: NA          Prior Living Arrangements/Services Living arrangements for the past 2 months: Single Family Home Lives with:: Adult Children (lives witrh son) Patient language and need for interpreter reviewed:: Yes        Need for Family Participation in Patient Care: Yes (Comment) Care giver support system in place?: Yes (comment)   Criminal Activity/Legal Involvement Pertinent to Current Situation/Hospitalization: No - Comment as needed  Activities of Daily Living Home Assistive Devices/Equipment: Eyeglasses,Walker (specify type),Other (Comment) (front wheeled walker, walk-in shower) ADL Screening (condition at time of admission) Patient's cognitive  ability adequate to safely complete daily activities?: Yes Is the patient deaf or have difficulty hearing?: Yes ([place]) Does the patient have difficulty seeing, even when wearing glasses/contacts?: No Does the patient have difficulty concentrating, remembering, or making decisions?: Yes (short term memory loss) Patient able to express need for assistance with ADLs?: Yes Does the patient have difficulty dressing or bathing?: No Independently performs ADLs?: No Communication: Independent Dressing (OT): Independent Grooming: Independent Feeding: Independent Bathing: Independent Toileting: Needs assistance Is this a change from baseline?: Pre-admission baseline In/Out Bed: Needs assistance Is this a change from baseline?: Pre-admission baseline Walks in Home: Needs assistance Is this a change from baseline?: Pre-admission baseline Does the patient have difficulty walking or climbing stairs?: Yes (secondary to weakness and shortness of breath) Weakness of Legs: Both Weakness of Arms/Hands: None  Permission Sought/Granted Permission sought to share information with : Case Manager   Emotional Assessment Appearance:: Appears stated age Attitude/Demeanor/Rapport: Engaged Affect (typically observed): Appropriate Orientation: : Oriented to Situation,Oriented to  Time,Oriented to Place,Oriented to Self Alcohol / Substance Use: Not Applicable Psych Involvement: No (comment)  Admission diagnosis:  Shortness of breath [R06.02] Dyspnea [R06.00] Generalized abdominal pain [R10.84] Patient Active Problem List   Diagnosis Date Noted  . ESRD (end stage renal disease) ([place]) 02/18/2020  . Dyspnea 02/18/2020  . Hyperkalemia 09/23/2019  . CKD (chronic kidney disease), stage V ([place]) 09/23/2019  . Acute kidney insufficiency 09/23/2019  . Abdominal aortic aneurysm (AAA) ([place]) 01/21/2019  . AAA (abdominal aortic aneurysm) without rupture ([place]) 01/21/2019  . Ascites 05/20/2017  . Normochromic  normocytic anemia 05/20/2017  . Essential hypertension 05/20/2017  . CKD (chronic kidney disease) stage 3, GFR 30-59 ml/min (H[facility]) 05/20/2017  . HLD (hyperlipidemia) 05/20/2017  . Hypothyroidism 05/20/2017   PCP:  [name]., MD Pharmacy:   [facility], [address] [place] [address] Phone: [phone] Fax: [phone]  Readmission Risk Interventions Readmission Risk Prevention Plan 02/20/2020 09/26/2019 01/22/2019  Transportation Screening Complete Complete Complete  PCP or Specialist Appt within 5-7 Days - - Complete  PCP or Specialist Appt within 3-5 Days Complete Complete -  Home Care Screening - - Complete  Medication Review (RN CM) - - Complete  HRI or Home Care Consult Complete Complete -  Social Work Consult for Recovery Care Planning/Counseling Complete Complete -  Palliative Care Screening Not Applicable Not Applicable -  Medication Review Press photographer) Complete Complete -  Some recent data might be hidden

## 2020-02-21 ENCOUNTER — Telehealth: Payer: Self-pay | Admitting: Physician Assistant

## 2020-02-21 NOTE — Telephone Encounter (Signed)
Transition of care contact from inpatient facility  Date of discharge: 02/20/20 Date of contact: 02/21/20 Method: Phone Spoke to: Patient  Patient contacted to discuss transition of care from recent inpatient hospitalization. Patient was admitted to [facility] from 02/18/20-02/20/20 with discharge diagnosis of dyspnea and volume overload.  Medication changes were reviewed. VDRA is given at outpatient HD, does not need to take at home  Patient will follow up with his/her outpatient HD unit on: 02/23/20 due to holiday schedule, pt aware

## 2020-02-26 DIAGNOSIS — R4189 Other symptoms and signs involving cognitive functions and awareness: Secondary | ICD-10-CM | POA: Insufficient documentation

## 2020-02-27 LAB — CULTURE, BODY FLUID W GRAM STAIN -BOTTLE: Culture: NO GROWTH

## 2020-03-10 ENCOUNTER — Other Ambulatory Visit: Payer: Self-pay | Admitting: Gastroenterology

## 2020-03-10 ENCOUNTER — Other Ambulatory Visit (HOSPITAL_COMMUNITY): Payer: Self-pay | Admitting: Gastroenterology

## 2020-03-10 DIAGNOSIS — K7460 Unspecified cirrhosis of liver: Secondary | ICD-10-CM

## 2020-03-13 ENCOUNTER — Other Ambulatory Visit: Payer: Self-pay

## 2020-03-13 ENCOUNTER — Other Ambulatory Visit (HOSPITAL_COMMUNITY): Payer: Self-pay | Admitting: Gastroenterology

## 2020-03-13 ENCOUNTER — Ambulatory Visit (HOSPITAL_COMMUNITY)
Admission: RE | Admit: 2020-03-13 | Discharge: 2020-03-13 | Disposition: A | Discharge status: Home / Self Care | Payer: Medicare Other | Source: Ambulatory Visit | Attending: Gastroenterology | Admitting: Gastroenterology

## 2020-03-13 DIAGNOSIS — K7460 Unspecified cirrhosis of liver: Secondary | ICD-10-CM

## 2020-03-13 DIAGNOSIS — R188 Other ascites: Secondary | ICD-10-CM | POA: Insufficient documentation

## 2020-03-13 MED ORDER — LIDOCAINE HCL 1 % IJ SOLN
INTRAMUSCULAR | Status: AC
  Filled 2020-03-13: qty 20

## 2020-03-21 IMAGING — DX DG CHEST 1V PORT
1 series · 1 of 1 positions shown · non-contrast
Comparison: Lung bases from abdominal CT 05/16/2017

CLINICAL DATA: Shortness of breath.

EXAM:
PORTABLE CHEST 1 VIEW

[chest ap]
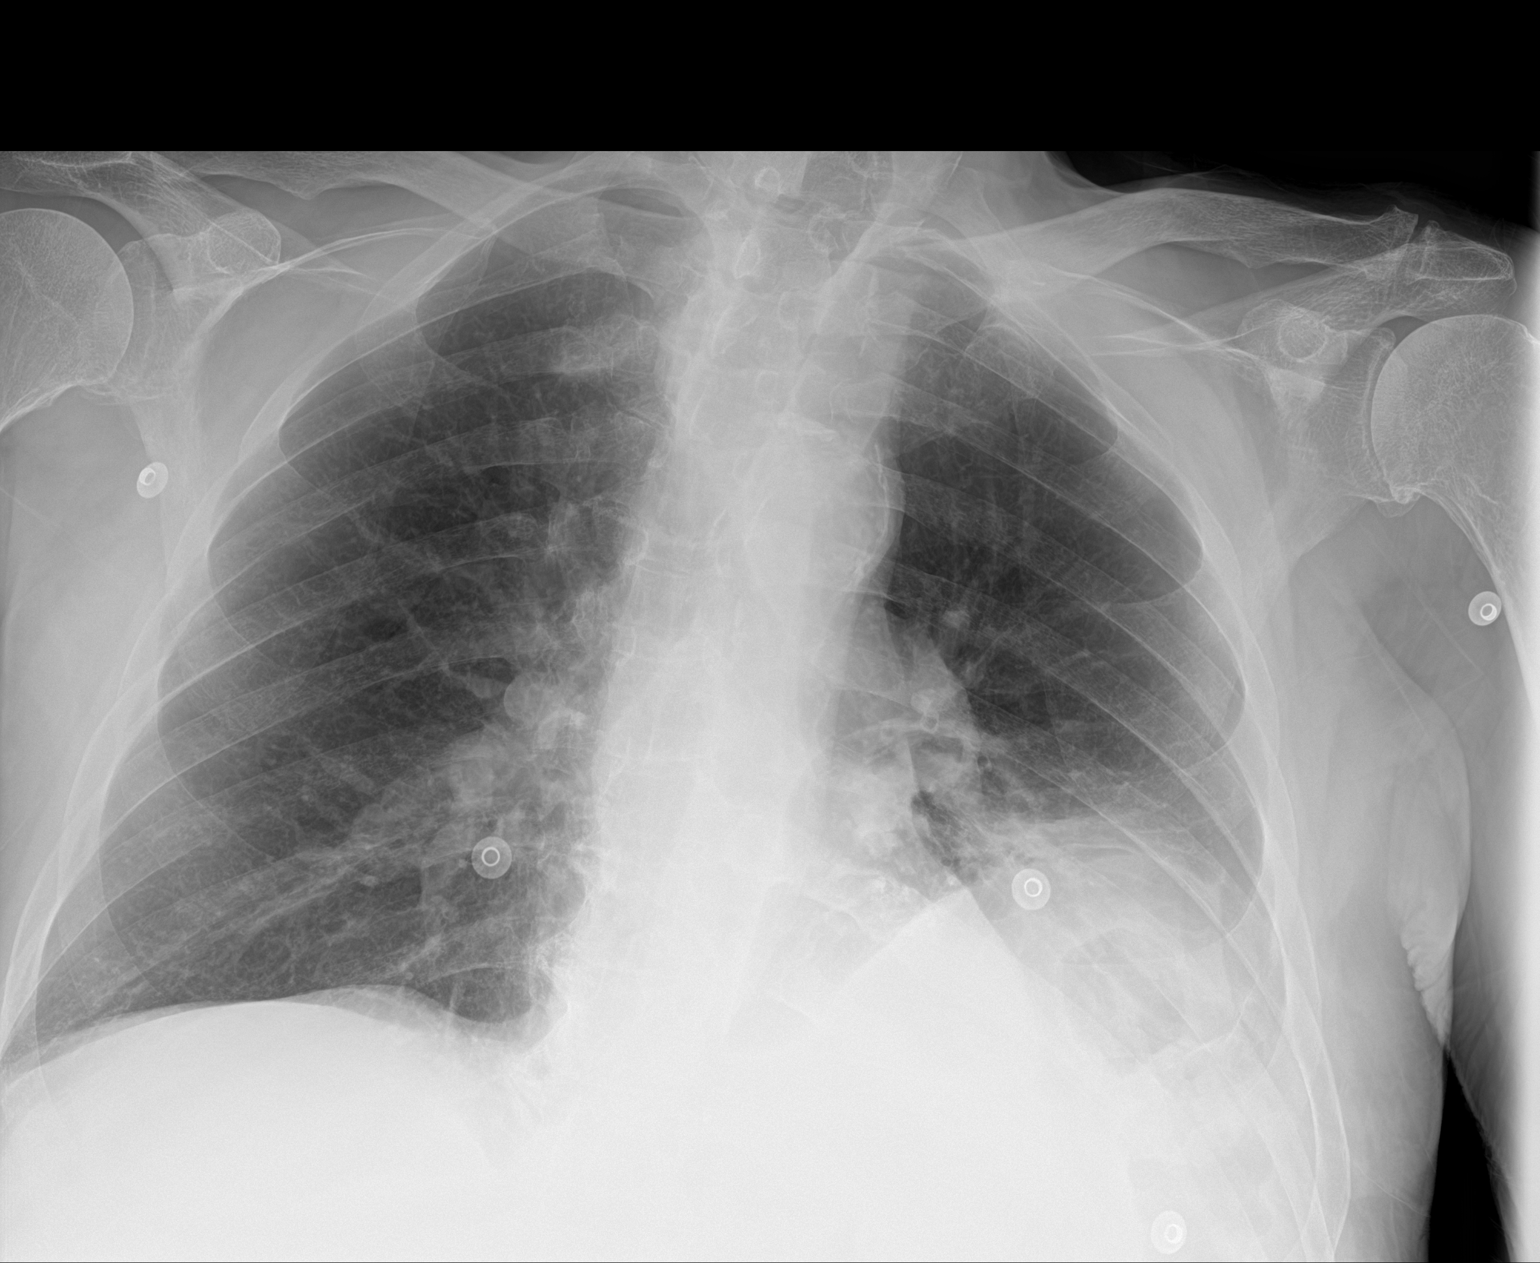

[1 of 1 positions shown; findings below may reference images not displayed]

FINDINGS: Left lung base opacity represents pleural effusion and atelectasis
is seen on recent CT. Right lung is clear. No pulmonary edema.
Normal heart size. Aortic atherosclerosis. No pneumothorax.
IMPRESSION: Left lung base opacity represents atelectasis and pleural effusion
as seen on recent abdominal CT.

Aortic Atherosclerosis ([id number]).

## 2020-03-24 ENCOUNTER — Other Ambulatory Visit: Payer: Self-pay | Admitting: Gastroenterology

## 2020-03-24 ENCOUNTER — Other Ambulatory Visit (HOSPITAL_COMMUNITY): Payer: Self-pay | Admitting: Gastroenterology

## 2020-03-24 DIAGNOSIS — R188 Other ascites: Secondary | ICD-10-CM

## 2020-03-27 ENCOUNTER — Ambulatory Visit (HOSPITAL_COMMUNITY)
Admission: RE | Admit: 2020-03-27 | Discharge: 2020-03-27 | Disposition: A | Discharge status: Home / Self Care | Payer: Medicare Other | Source: Ambulatory Visit | Attending: Gastroenterology | Admitting: Gastroenterology

## 2020-03-27 ENCOUNTER — Other Ambulatory Visit: Payer: Self-pay

## 2020-03-27 DIAGNOSIS — R188 Other ascites: Secondary | ICD-10-CM | POA: Insufficient documentation

## 2020-03-27 HISTORY — PX: IR PARACENTESIS: IMG2679

## 2020-03-27 MED ORDER — LIDOCAINE HCL 1 % IJ SOLN
INTRAMUSCULAR | Status: DC | PRN
  Administered 2020-03-27: 10 mL

## 2020-03-27 MED ORDER — LIDOCAINE HCL 1 % IJ SOLN
INTRAMUSCULAR | Status: AC
  Filled 2020-03-27: qty 20

## 2020-03-27 NOTE — Procedures (Signed)
PROCEDURE SUMMARY:  Successful image-guided paracentesis from the right lateral abdomen.  Yielded 1.4 liters of clear yellow fluid.  No immediate complications.  EBL = 0 mL. Patient tolerated well.   Specimen was not sent for labs.  Please see imaging section of Epic for full dictation.   Claris Pong Lichelle Viets PA-C 03/27/2020 10:08 AM

## 2020-03-28 ENCOUNTER — Emergency Department (HOSPITAL_COMMUNITY)
Admission: EM | Admit: 2020-03-28 | Discharge: 2020-03-28 | Disposition: A | Discharge status: Home / Self Care | Payer: Medicare Other | Attending: Emergency Medicine | Admitting: Emergency Medicine

## 2020-03-28 ENCOUNTER — Other Ambulatory Visit: Payer: Self-pay

## 2020-03-28 ENCOUNTER — Encounter (HOSPITAL_COMMUNITY): Payer: Self-pay | Admitting: Emergency Medicine

## 2020-03-28 ENCOUNTER — Emergency Department (HOSPITAL_COMMUNITY): Payer: Medicare Other

## 2020-03-28 DIAGNOSIS — R1084 Generalized abdominal pain: Secondary | ICD-10-CM | POA: Diagnosis not present

## 2020-03-28 DIAGNOSIS — R531 Weakness: Secondary | ICD-10-CM | POA: Insufficient documentation

## 2020-03-28 DIAGNOSIS — Z79899 Other long term (current) drug therapy: Secondary | ICD-10-CM | POA: Diagnosis not present

## 2020-03-28 DIAGNOSIS — E039 Hypothyroidism, unspecified: Secondary | ICD-10-CM | POA: Insufficient documentation

## 2020-03-28 DIAGNOSIS — Z992 Dependence on renal dialysis: Secondary | ICD-10-CM | POA: Insufficient documentation

## 2020-03-28 DIAGNOSIS — I120 Hypertensive chronic kidney disease with stage 5 chronic kidney disease or end stage renal disease: Secondary | ICD-10-CM | POA: Diagnosis not present

## 2020-03-28 DIAGNOSIS — Z20822 Contact with and (suspected) exposure to covid-19: Secondary | ICD-10-CM | POA: Diagnosis not present

## 2020-03-28 DIAGNOSIS — R140 Abdominal distension (gaseous): Secondary | ICD-10-CM | POA: Diagnosis not present

## 2020-03-28 DIAGNOSIS — Z8546 Personal history of malignant neoplasm of prostate: Secondary | ICD-10-CM | POA: Insufficient documentation

## 2020-03-28 DIAGNOSIS — I2510 Atherosclerotic heart disease of native coronary artery without angina pectoris: Secondary | ICD-10-CM | POA: Insufficient documentation

## 2020-03-28 DIAGNOSIS — N186 End stage renal disease: Secondary | ICD-10-CM | POA: Insufficient documentation

## 2020-03-28 LAB — COMPREHENSIVE METABOLIC PANEL
ALT: 47 U/L — ABNORMAL HIGH (ref 0–44)
AST: 36 U/L (ref 15–41)
Albumin: 3.2 g/dL — ABNORMAL LOW (ref 3.5–5.0)
Alkaline Phosphatase: 286 U/L — ABNORMAL HIGH (ref 38–126)
Anion gap: 13 (ref 5–15)
BUN: 70 mg/dL — ABNORMAL HIGH (ref 8–23)
CO2: 28 mmol/L (ref 22–32)
Calcium: 9.2 mg/dL (ref 8.9–10.3)
Chloride: 93 mmol/L — ABNORMAL LOW (ref 98–111)
Creatinine, Ser: 6.5 mg/dL — ABNORMAL HIGH (ref 0.61–1.24)
GFR, Estimated: 8 mL/min — ABNORMAL LOW (ref >60)
Glucose, Bld: 107 mg/dL — ABNORMAL HIGH (ref 70–99)
Potassium: 3.7 mmol/L (ref 3.5–5.1)
Sodium: 134 mmol/L — ABNORMAL LOW (ref 135–145)
Total Bilirubin: 0.7 mg/dL (ref 0.3–1.2)
Total Protein: 7.1 g/dL (ref 6.5–8.1)

## 2020-03-28 LAB — CBC
HCT: 34.5 % — ABNORMAL LOW (ref 39.0–52.0)
Hemoglobin: 11.3 g/dL — ABNORMAL LOW (ref 13.0–17.0)
MCH: 33.4 pg (ref 26.0–34.0)
MCHC: 32.8 g/dL (ref 30.0–36.0)
MCV: 102.1 fL — ABNORMAL HIGH (ref 80.0–100.0)
Platelets: 150 10*3/uL (ref 150–400)
RBC: 3.38 MIL/uL — ABNORMAL LOW (ref 4.22–5.81)
RDW: 16.3 % — ABNORMAL HIGH (ref 11.5–15.5)
WBC: 7.8 10*3/uL (ref 4.0–10.5)
nRBC: 0 % (ref 0.0–0.2)

## 2020-03-28 LAB — LIPASE, BLOOD: Lipase: 41 U/L (ref 11–51)

## 2020-03-28 NOTE — ED Provider Notes (Signed)
[facility] EMERGENCY DEPARTMENT Provider Note   CSN: [id number] Arrival date & time: 03/28/20  1629     History Chief Complaint  Patient presents with  . Weakness    Ricky Harris is a 80 y.o. male who presents today from dialysis center for concern of worsening weakness for the last 3 days, as well as generalized abdominal pain and distention.  Additionally endorses loss of appetite or interest in food.  He completed 2 of the 4 hours of dialysis today.  He is on dialysis on Tuesdays and Thursdays.  Patient with history of end-stage renal disease as well as nonalcoholic cirrhosis of the liver, with recurrent ascites and scheduled therapeutic paracenteses.  Most recent paracentesis reported from the patient was yesterday where they removed approximately 1.5 L of fluid from his abdomen.  He states that his belly has not become more distended since that time however he still feels uncomfortable in his becoming weaker.  When asked for clarification what changed today to bring him to the emergency department he is unable to clearly specify any acute change from his baseline, just states that he was having a bad day.  I personally reviewed the patient's medical records.  History of hypertension, ESRD on hemodialysis with fistula in the left arm, hyperlipidemia, AAA without rupture, nonalcoholic cirrhosis.  HPI     Past Medical History:  Diagnosis Date  . AAA (abdominal aortic aneurysm) ([place])    5.3cm , magd by vascular Dr Scot Dock ,  . Anemia    " slightly"  . Aortic stenosis   . Arthritis    knees  . Ascites   . Cancer [facility])    prostate  . Chronic kidney disease    stage 4, mgd by Dr Justin Mend   . 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([place])   . Coronary artery disease    on CT scan  . Gout   . H/O hypercholesterolemia   . Heart murmur   . History of falling    recent- see ct chest results of 04/25/2018  . Hypertension   . Hypothyroidism   . Liver cirrhosis  ([place])   . Prediabetes    pt denies   . Renal artery stenosis ([place])   . Thrombocytopenia ([place])   . Wears glasses   . Wears glasses     Patient Active Problem List   Diagnosis Date Noted  . ESRD (end stage renal disease) ([place]) 02/18/2020  . Dyspnea 02/18/2020  . Hyperkalemia 09/23/2019  . CKD (chronic kidney disease), stage V ([place]) 09/23/2019  . Acute kidney insufficiency 09/23/2019  . Abdominal aortic aneurysm (AAA) ([place]) 01/21/2019  . AAA (abdominal aortic aneurysm) without rupture ([place]) 01/21/2019  . Ascites 05/20/2017  . Normochromic normocytic anemia 05/20/2017  . Essential hypertension 05/20/2017  . CKD (chronic kidney disease) stage 3, GFR 30-59 ml/min ([facility]) 05/20/2017  . HLD (hyperlipidemia) 05/20/2017  . Hypothyroidism 05/20/2017    Past Surgical History:  Procedure Laterality Date  . ABDOMINAL AORTIC ENDOVASCULAR STENT GRAFT N/A 01/21/2019   Procedure: ABDOMINAL AORTIC ENDOVASCULAR STENT GRAFT WITH CO2;  Surgeon: Angelia Mould, MD;  Location: [facility];  Service: Vascular;  Laterality: N/A;  . ABDOMINAL AORTOGRAM W/LOWER EXTREMITY Bilateral 12/14/2018   Procedure: ABDOMINAL AORTOGRAM W/LOWER EXTREMITY;  Surgeon: Angelia Mould, MD;  Location: [facility];  Service: Cardiovascular;  Laterality: Bilateral;  . AV FISTULA PLACEMENT Left 03/12/2019   Procedure: LEFT ARTERIOVENOUS Arteriovenous FISTULA CREATION.;  Surgeon: Angelia Mould,  MD;  Location: [facility];  Service: Vascular;  Laterality: Left;  . CATARACT EXTRACTION W/ INTRAOCULAR LENS  IMPLANT, BILATERAL    . CHOLECYSTECTOMY    . ESOPHAGEAL BANDING N/A 08/11/2017   Procedure: ESOPHAGEAL BANDING;  Surgeon: Otis Brace, MD;  Location: [facility];  Service: Gastroenterology;  Laterality: N/A;  . ESOPHAGEAL BANDING N/A 10/26/2017   Procedure: ESOPHAGEAL BANDING;  Surgeon: Otis Brace, MD;  Location: [facility];  Service: Gastroenterology;  Laterality: N/A;  . ESOPHAGEAL BANDING N/A  12/19/2017   Procedure: ESOPHAGEAL BANDING;  Surgeon: Otis Brace, MD;  Location: [facility];  Service: Gastroenterology;  Laterality: N/A;  . ESOPHAGOGASTRODUODENOSCOPY (EGD) WITH PROPOFOL N/A 07/05/2017   Procedure: ESOPHAGOGASTRODUODENOSCOPY (EGD) WITH PROPOFOL;  Surgeon: Otis Brace, MD;  [facility]: [facility];  Service: Gastroenterology;  Laterality: N/A;  . ESOPHAGOGASTRODUODENOSCOPY (EGD) WITH PROPOFOL N/A 08/11/2017   Procedure: ESOPHAGOGASTRODUODENOSCOPY (EGD) WITH PROPOFOL;  Surgeon: Otis Brace, MD;  Location: [facility];  Service: Gastroenterology;  Laterality: N/A;  . ESOPHAGOGASTRODUODENOSCOPY (EGD) WITH PROPOFOL N/A 10/26/2017   Procedure: ESOPHAGOGASTRODUODENOSCOPY (EGD) WITH PROPOFOL;  Surgeon: Otis Brace, MD;  [facility];  Service: Gastroenterology;  Laterality: N/A;  . ESOPHAGOGASTRODUODENOSCOPY (EGD) WITH PROPOFOL N/A 12/19/2017   Procedure: ESOPHAGOGASTRODUODENOSCOPY (EGD) WITH PROPOFOL;  Surgeon: Otis Brace, MD;  [facility];  Service: Gastroenterology;  Laterality: N/A;  . ESOPHAGOGASTRODUODENOSCOPY (EGD) WITH PROPOFOL N/A 10/29/2018   Procedure: ESOPHAGOGASTRODUODENOSCOPY (EGD) WITH PROPOFOL;  Surgeon: Otis Brace, MD;  [facility];  Service: Gastroenterology;  Laterality: N/A;  . ESOPHAGOGASTRODUODENOSCOPY (EGD) WITH PROPOFOL N/A 02/14/2020   Procedure: ESOPHAGOGASTRODUODENOSCOPY (EGD) WITH PROPOFOL;  Surgeon: Otis Brace, MD;  Location: [facility];  Service: Gastroenterology;  Laterality: N/A;  . EYE SURGERY     bilateral cataract removal with lens placement  . HERNIA REPAIR    . IR PARACENTESIS  12/26/2018  . IR PARACENTESIS  01/02/2019  . IR PARACENTESIS  01/15/2019  . IR PARACENTESIS  01/30/2019  . IR PARACENTESIS  02/05/2019  . IR PARACENTESIS  02/14/2019  . IR PARACENTESIS  02/28/2019  . IR PARACENTESIS  03/14/2019  . IR PARACENTESIS  03/29/2019  . IR PARACENTESIS  04/11/2019  . IR  PARACENTESIS  04/25/2019  . IR PARACENTESIS  05/09/2019  . IR PARACENTESIS  06/03/2019  . IR PARACENTESIS  06/06/2019  . IR PARACENTESIS  06/20/2019  . IR PARACENTESIS  07/03/2019  . IR PARACENTESIS  07/19/2019  . IR PARACENTESIS  08/08/2019  . IR PARACENTESIS  08/27/2019  . IR PARACENTESIS  09/09/2019  . IR PARACENTESIS  09/24/2019  . IR PARACENTESIS  10/07/2019  . IR PARACENTESIS  10/23/2019  . IR PARACENTESIS  11/07/2019  . IR PARACENTESIS  11/21/2019  . IR PARACENTESIS  12/04/2019  . IR PARACENTESIS  12/25/2019  . IR PARACENTESIS  01/10/2020  . IR PARACENTESIS  01/27/2020  . IR PARACENTESIS  02/19/2020  . IR PARACENTESIS  03/27/2020  . PROSTATECTOMY    . subtotal gastrectomy         Family History  Problem Relation Age of Onset  . Diabetes Mellitus II Mother   . Stroke Mother     Social History   Tobacco Use  . Smoking status: Never Smoker  . Smokeless tobacco: Never Used  Vaping Use  . Vaping Use: Never used  Substance Use Topics  . Alcohol use: Not Currently  . Drug use: Never    Home Medications Prior to Admission medications   Medication Sig Start Date End Date Taking? Authorizing Provider  atorvastatin (LIPITOR) 10  MG tablet Take 10 mg by mouth at bedtime.  05/06/17   [provider]  calcitRIOL (ROCALTROL) 0.25 MCG capsule Take 1 capsule (0.25 mcg total) by mouth every Tuesday, Thursday, and Saturday at 6 PM. 02/20/20   Rai, Vernelle Emerald, MD  calcium acetate (PHOSLO) 667 MG capsule Take 1 capsule (667 mg total) by mouth 3 (three) times daily with meals. 02/20/20   Rai, Vernelle Emerald, MD  carvedilol (COREG) 12.5 MG tablet Take 12.5 mg by mouth 2 (two) times daily. 03/22/17   [provider]  cloNIDine (CATAPRES) 0.1 MG tablet Take 1 tablet (0.1 mg total) by mouth daily. Patient taking differently: Take 0.2 mg by mouth daily. 09/28/19   Autry-Lott, Naaman Plummer, DO  colchicine 0.6 MG tablet Take 0.6 mg by mouth daily as needed (gout flare).  03/22/17   [provider]  febuxostat (ULORIC) 40 MG tablet Take 1 tablet (40 mg total) by mouth daily. 02/20/20   Rai, Ripudeep Raliegh Ip, MD  lactulose (CHRONULAC) 10 GM/15ML solution Take 15 mLs (10 g total) by mouth daily. 02/20/20   Rai, Vernelle Emerald, MD  levothyroxine (SYNTHROID) 25 MCG tablet Take 25 mcg by mouth daily. 07/20/19   [provider]  multivitamin (RENA-VIT) TABS tablet Take 1 tablet by mouth daily. Patient not taking: No sig reported 09/27/19   Autry-Lott, Naaman Plummer, DO  pantoprazole (PROTONIX) 40 MG tablet Take 1 tablet (40 mg total) by mouth daily. 02/20/20 04/20/20  Rai, Vernelle Emerald, MD  traMADol (ULTRAM) 50 MG tablet Take 1 tablet (50 mg total) by mouth every 8 (eight) hours as needed for moderate pain. 03/12/19   Rhyne, Hulen Shouts, PA-C    Allergies    Cephalexin and Hydralazine  Review of Systems   Review of Systems  Constitutional: Positive for activity change, appetite change and fatigue. Negative for chills, fever and unexpected weight change.  HENT: Negative.   Eyes: Negative.   Respiratory: Negative.   Cardiovascular: Negative.   Gastrointestinal: Positive for abdominal distention and nausea.  Genitourinary: Negative.   Musculoskeletal: Negative.   Skin: Negative.   Neurological: Negative.   Psychiatric/Behavioral: Negative.     Physical Exam Updated Vital Signs BP 130/68   Pulse 79   Temp 98.6 F (37 C) (Oral)   Resp (!) 22   SpO2 96%   Physical Exam Vitals and nursing note reviewed.  HENT:     Head: Normocephalic and atraumatic.      Nose: Nose normal.     Mouth/Throat:     Mouth: Mucous membranes are moist.     Pharynx: Oropharynx is clear. Uvula midline. No oropharyngeal exudate or posterior oropharyngeal erythema.     Tonsils: No tonsillar exudate.  Eyes:     General: Lids are normal. Vision grossly intact.        Right eye: No discharge.        Left eye: No discharge.     Extraocular Movements: Extraocular movements intact.     Conjunctiva/sclera:  Conjunctivae normal.     Pupils: Pupils are equal, round, and reactive to light.  Neck:     Trachea: Trachea and phonation normal.  Cardiovascular:     Rate and Rhythm: Normal rate and regular rhythm.     Pulses: Normal pulses.          Radial pulses are 2+ on the right side and 2+ on the left side.       Dorsalis pedis pulses are 2+ on the right side and 2+ on  the left side.     Heart sounds: Normal heart sounds. No murmur heard.   Pulmonary:     Effort: Pulmonary effort is normal. No respiratory distress.     Breath sounds: Examination of the right-lower field reveals decreased breath sounds. Examination of the left-lower field reveals decreased breath sounds. Decreased breath sounds present. No wheezing or rales.  Chest:     Chest wall: No lacerations, deformity, swelling, tenderness, crepitus or edema.  Abdominal:     General: Bowel sounds are normal. There is distension.     Palpations: Abdomen is soft. There is fluid wave.     Tenderness: There is generalized abdominal tenderness. There is no right CVA tenderness or left CVA tenderness.  Musculoskeletal:        General: No deformity.     Cervical back: Full passive range of motion without pain, normal range of motion and neck supple. No rigidity or crepitus. No pain with movement, spinous process tenderness or muscular tenderness.     Right lower leg: No edema.     Left lower leg: No edema.  Lymphadenopathy:     Cervical: No cervical adenopathy.  Skin:    General: Skin is warm and dry.     Capillary Refill: Capillary refill takes less than 2 seconds.  Neurological:     General: No focal deficit present.     Mental Status: He is alert and oriented to person, place, and time. Mental status is at baseline.     Cranial Nerves: Cranial nerves are intact.     Sensory: Sensation is intact.     Motor: Motor function is intact.     Gait: Gait is intact.  Psychiatric:        Mood and Affect: Mood normal.     ED Results /  Procedures / Treatments   Labs (all labs ordered are listed, but only abnormal results are displayed) Labs Reviewed  CBC - Abnormal; Notable for the following components:      Result Value   RBC 3.38 (*)    Hemoglobin 11.3 (*)    HCT 34.5 (*)    MCV 102.1 (*)    RDW 16.3 (*)    All other components within normal limits  COMPREHENSIVE METABOLIC PANEL - Abnormal; Notable for the following components:   Sodium 134 (*)    Chloride 93 (*)    Glucose, Bld 107 (*)    BUN 70 (*)    Creatinine, Ser 6.50 (*)    Albumin 3.2 (*)    ALT 47 (*)    Alkaline Phosphatase 286 (*)    GFR, Estimated 8 (*)    All other components within normal limits  SARS CORONAVIRUS 2 (TAT 6-24 HRS)  LIPASE, BLOOD  URINALYSIS, ROUTINE W REFLEX MICROSCOPIC  CBG MONITORING, ED    EKG EKG Interpretation  Date/Time:  Saturday March 28 2020 16:58:13 EST Ventricular Rate:  77 PR Interval:  156 QRS Duration: 90 QT Interval:  380 QTC Calculation: 430 R Axis:   15 Text Interpretation: Normal sinus rhythm Septal infarct , age undetermined Abnormal ECG since last tracing no significant change Confirmed by Noemi Chapel [phone]) on 03/28/2020 8:37:25 PM   Radiology DG Chest Port 1 View  Result Date: 03/28/2020 CLINICAL DATA:  Weakness EXAM: PORTABLE CHEST 1 VIEW COMPARISON:  February 18, 2020 FINDINGS: There is stable elevation of the left hemidiaphragm. There are persistent calcifications of the thoracic aorta. The heart size is stable. There is no pneumothorax. No large  pleural effusion. There is probable bibasilar atelectasis. Degenerative changes are noted of the bilateral AC joints. There are degenerative changes of the bilateral glenohumeral joints. IMPRESSION: No active disease. Electronically Signed   By: Constance Holster M.D.   On: 03/28/2020 21:16   IR Paracentesis  Result Date: 03/27/2020 INDICATION: Patient with history of NASH cirrhosis, ESRD on HD, abdominal distension, and recurrent ascites.  Request is made for therapeutic paracentesis. EXAM: ULTRASOUND GUIDED THERAPEUTIC PARACENTESIS MEDICATIONS: 10 mL 1% lidocaine COMPLICATIONS: None immediate. PROCEDURE: Informed written consent was obtained from the patient after a discussion of the risks, benefits and alternatives to treatment. A timeout was performed prior to the initiation of the procedure. Initial ultrasound scanning demonstrates a large amount of ascites within the right lower abdominal quadrant. The right lower abdomen was prepped and draped in the usual sterile fashion. 1% lidocaine was used for local anesthesia. Following this, a 19 gauge, 7-cm, Yueh catheter was introduced. An ultrasound image was saved for documentation purposes. The paracentesis was performed. The catheter was removed and a dressing was applied. The patient tolerated the procedure well without immediate post procedural complication. FINDINGS: A total of approximately 1.4 L of clear yellow fluid was removed. IMPRESSION: Successful ultrasound-guided paracentesis yielding 1.4 L of peritoneal fluid. Read by: Earley Abide, PA-C Electronically Signed   By: Corrie Mckusick D.O.   On: 03/27/2020 10:31   Procedures Procedures   Medications Ordered in ED Medications - No data to display  ED Course  I have reviewed the triage vital signs and the nursing notes.  Pertinent labs & imaging results that were available during my care of the patient were reviewed by me and considered in my medical decision making (see chart for details).    MDM Rules/Calculators/A&P                         80 year old male with end-stage renal disease and history of cirrhosis who presents with concern for fatigue and weakness x3 days.  Additionally endorses abdominal distention and pain, underwent therapeutic paracentesis yesterday removed 1.5 L.  Mildly hypertensive on intake 145/72.  Vital signs otherwise normal.  Physical exam reassuring.  Cardiac exam is normal, pulmonary exam with  decreased breath sounds in lung bases bilaterally. Abdominal exam with generalized abdominal tenderness to palpation, fluid wave, but abdomen is soft.  There is no rebound or guarding.  There is no lower extremity edema.  Will proceed with basic laboratory studies.  CBC with mild anemia, hemoglobin 11.3  now at patient's baseline.  CMP with poor renal function and transaminitis and elevated alk phosphatase, at patient baseline.  EKG without change from prior, chest x-ray negative for acute cardiopulmonary disease.  Bedside Ultrasound performed by attending physician, revealed very small pockets of fluid throughout the abdomen, but no large fluid collection amenable to drainage at this time.  Reassuring laboratory studies and bedside ultrasound, do not feel patient is experiencing SBP at this time.  Suspect discomfort from chronic intermittent abdominal distention secondary to ascites.  Will proceed with COVID-19 test given new anorexia and increased fatigue.  Given reassuring physical exam, laboratory and imaging studies, no further work-up is warranted at this time.  Patient to follow-up on his COVID-19 test in his MyChart app or receive a phone call tomorrow from the ED.  Recommend close follow-up with primary care doctor, gastroenterologist, and nephrology.  Simona Huh and his son at the bedside voiced understanding of his medical evaluation and treatment plan. Each  of his questions was answered to his expressed satisfaction.  Return precautions given.  Patient is stable and appropriate for discharge at this time.  KRISTOFER SCHAFFERT was evaluated in Emergency Department on 03/28/2020 for the symptoms described in the history of present illness. He was evaluated in the context of the global COVID-19 pandemic, which necessitated consideration that the patient might be at risk for infection with the SARS-CoV-2 virus that causes COVID-19. Institutional protocols and algorithms that pertain to the evaluation of  patients at risk for COVID-19 are in a state of rapid change based on information released by regulatory bodies including the CDC and federal and state organizations. These policies and algorithms were followed during the patient's care in the ED.  This chart was dictated using voice recognition software, Dragon. Despite the best efforts of this provider to proofread and correct errors, errors may still occur which can change documentation meaning.  Final Clinical Impression(s) / ED Diagnoses Final diagnoses:  None    Rx / DC Orders ED Discharge Orders    None       Aura Dials 03/28/20 2326    Noemi Chapel, MD 03/29/20 1459

## 2020-03-28 NOTE — ED Triage Notes (Signed)
Pt to triage via [facility] from dialysis.  Reports generalized weakness and generalized abd pain x 2 days.  Decreased PO intake.  Pt completed 2 (of the 4) hours of dialysis.

## 2020-03-28 NOTE — Discharge Instructions (Addendum)
You were evaluated in the emergency department today for your weakness.  Your blood work, chest x-ray, EKG, and abdominal ultrasound were very reassuring.  You have been tested for COVID-19, you will receive a phone call with your test results tomorrow.  Please continue to follow-up with dialysis as scheduled, follow-up with your gastroenterologist as scheduled. Follow-up closely with your primary care doctor.    Return to the emergency department if you develop any new chest pain, difficulty breathing, severe abdominal pain, or any other new severe symptoms.

## 2020-03-28 NOTE — ED Provider Notes (Signed)
Hx of cirrhosis and ESRD - had 2/4 hours of dialysis today, he doesn't know why, he has 3 days of poor appetite and generalized fatigue / weakness, labs unremarkable,  BUN and Cr appropriately elevated, K normal, not peritoneal and has no tense abdomen, had 1.5 L removed yesterday.  CXR without findings, VS normal -   The patient has an ultrasound showing minimal fluid in the abdomen, there is no way to get a sample with the minimal fluid that is there, in fact on palpation of the abdomen with the ultrasound probe the patient does not have any guarding and does not act like it hurts.  He has a penile implant which is nontender, you can feel the inguinal apparatus, there is no tenderness or obvious hernias in this location.  Patient and family member at the bedside, they are in agreement to go home, Covid test pending at discharge  Will check covid but pt appears well for d/c.  Medical screening examination/treatment/procedure(s) were conducted as a shared visit with non-physician practitioner(s) and myself.  I personally evaluated the patient during the encounter.  Clinical Impression:   Final diagnoses:  Weakness         Noemi Chapel, MD 03/29/20 1459

## 2020-03-28 NOTE — ED Notes (Signed)
Discharge instructions provided to patient. Verbalized understanding. Alert and oriented. Escorted out of ED via w/c. °

## 2020-03-29 LAB — SARS CORONAVIRUS 2 (TAT 6-24 HRS): SARS Coronavirus 2: NEGATIVE

## 2020-04-14 ENCOUNTER — Emergency Department (HOSPITAL_COMMUNITY)
Admission: EM | Admit: 2020-04-14 | Discharge: 2020-04-14 | Disposition: A | Discharge status: Home / Self Care | Payer: Medicare Other | Attending: Emergency Medicine | Admitting: Emergency Medicine

## 2020-04-14 ENCOUNTER — Encounter (HOSPITAL_COMMUNITY): Payer: Self-pay | Admitting: Emergency Medicine

## 2020-04-14 ENCOUNTER — Emergency Department (HOSPITAL_COMMUNITY): Payer: Medicare Other

## 2020-04-14 ENCOUNTER — Other Ambulatory Visit: Payer: Self-pay

## 2020-04-14 DIAGNOSIS — I120 Hypertensive chronic kidney disease with stage 5 chronic kidney disease or end stage renal disease: Secondary | ICD-10-CM | POA: Diagnosis not present

## 2020-04-14 DIAGNOSIS — I2510 Atherosclerotic heart disease of native coronary artery without angina pectoris: Secondary | ICD-10-CM | POA: Diagnosis not present

## 2020-04-14 DIAGNOSIS — Z79899 Other long term (current) drug therapy: Secondary | ICD-10-CM | POA: Diagnosis not present

## 2020-04-14 DIAGNOSIS — Z8546 Personal history of malignant neoplasm of prostate: Secondary | ICD-10-CM | POA: Insufficient documentation

## 2020-04-14 DIAGNOSIS — E039 Hypothyroidism, unspecified: Secondary | ICD-10-CM | POA: Diagnosis not present

## 2020-04-14 DIAGNOSIS — E8770 Fluid overload, unspecified: Secondary | ICD-10-CM | POA: Insufficient documentation

## 2020-04-14 DIAGNOSIS — N186 End stage renal disease: Secondary | ICD-10-CM | POA: Diagnosis not present

## 2020-04-14 DIAGNOSIS — R109 Unspecified abdominal pain: Secondary | ICD-10-CM | POA: Diagnosis present

## 2020-04-14 DIAGNOSIS — R1084 Generalized abdominal pain: Secondary | ICD-10-CM

## 2020-04-14 LAB — COMPREHENSIVE METABOLIC PANEL
ALT: 35 U/L (ref 0–44)
AST: 25 U/L (ref 15–41)
Albumin: 3.9 g/dL (ref 3.5–5.0)
Alkaline Phosphatase: 258 U/L — ABNORMAL HIGH (ref 38–126)
Anion gap: 18 — ABNORMAL HIGH (ref 5–15)
BUN: 102 mg/dL — ABNORMAL HIGH (ref 8–23)
CO2: 25 mmol/L (ref 22–32)
Calcium: 13.2 mg/dL (ref 8.9–10.3)
Chloride: 91 mmol/L — ABNORMAL LOW (ref 98–111)
Creatinine, Ser: 10.01 mg/dL — ABNORMAL HIGH (ref 0.61–1.24)
GFR, Estimated: 5 mL/min — ABNORMAL LOW (ref >60)
Glucose, Bld: 93 mg/dL (ref 70–99)
Potassium: 4.5 mmol/L (ref 3.5–5.1)
Sodium: 134 mmol/L — ABNORMAL LOW (ref 135–145)
Total Bilirubin: 0.8 mg/dL (ref 0.3–1.2)
Total Protein: 7.8 g/dL (ref 6.5–8.1)

## 2020-04-14 LAB — LIPASE, BLOOD: Lipase: 38 U/L (ref 11–51)

## 2020-04-14 LAB — CBC
HCT: 37 % — ABNORMAL LOW (ref 39.0–52.0)
Hemoglobin: 12.2 g/dL — ABNORMAL LOW (ref 13.0–17.0)
MCH: 33.9 pg (ref 26.0–34.0)
MCHC: 33 g/dL (ref 30.0–36.0)
MCV: 102.8 fL — ABNORMAL HIGH (ref 80.0–100.0)
Platelets: 126 10*3/uL — ABNORMAL LOW (ref 150–400)
RBC: 3.6 MIL/uL — ABNORMAL LOW (ref 4.22–5.81)
RDW: 15.6 % — ABNORMAL HIGH (ref 11.5–15.5)
WBC: 7.3 10*3/uL (ref 4.0–10.5)
nRBC: 0 % (ref 0.0–0.2)

## 2020-04-14 MED ORDER — IOHEXOL 300 MG/ML  SOLN
75.0000 mL | Freq: Once | INTRAMUSCULAR | Status: AC | PRN
  Administered 2020-04-14: 75 mL via INTRAVENOUS

## 2020-04-14 NOTE — ED Provider Notes (Signed)
[facility] DEPT Provider Note   CSN: [id number] Arrival date & time: 04/14/20  1136     History Chief Complaint  Patient presents with  . Shortness of Breath  . Abdominal Pain    Ricky Harris is a 80 y.o. male.  HPI Patient with multiple medical issues including end-stage renal disease presents with abdominal pain, dyspnea, nausea, vomiting. He notes this episode began about 4 days ago.  He has been able to tolerate dialysis previously, but today, with persistent symptoms presents for evaluation. Last dialysis was 3 days ago. Last bowel movement was possibly yesterday, possibly 3 days ago. No clear precipitant. Since onset no relief with anything, minimal change with Tums. Abdominal pain is around the umbilical area, seemingly nonradiating. No report of fever.    Past Medical History:  Diagnosis Date  . AAA (abdominal aortic aneurysm) ([place])    5.3cm , magd by vascular Dr Scot Dock ,  . Anemia    " slightly"  . Aortic stenosis   . Arthritis    knees  . Ascites   . Cancer [facility])    prostate  . Chronic kidney disease    stage 4, mgd by Dr Justin Mend   . 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([place])   . Coronary artery disease    on CT scan  . Gout   . H/O hypercholesterolemia   . Heart murmur   . History of falling    recent- see ct chest results of 04/25/2018  . Hypertension   . Hypothyroidism   . Liver cirrhosis ([place])   . Prediabetes    pt denies   . Renal artery stenosis ([place])   . Thrombocytopenia ([place])   . Wears glasses   . Wears glasses     Patient Active Problem List   Diagnosis Date Noted  . ESRD (end stage renal disease) ([place]) 02/18/2020  . Dyspnea 02/18/2020  . Hyperkalemia 09/23/2019  . CKD (chronic kidney disease), stage V ([place]) 09/23/2019  . Acute kidney insufficiency 09/23/2019  . Abdominal aortic aneurysm (AAA) ([place]) 01/21/2019  . AAA (abdominal aortic aneurysm) without rupture ([place]) 01/21/2019  . Ascites 05/20/2017   . Normochromic normocytic anemia 05/20/2017  . Essential hypertension 05/20/2017  . CKD (chronic kidney disease) stage 3, GFR 30-59 ml/min ([facility]) 05/20/2017  . HLD (hyperlipidemia) 05/20/2017  . Hypothyroidism 05/20/2017    Past Surgical History:  Procedure Laterality Date  . ABDOMINAL AORTIC ENDOVASCULAR STENT GRAFT N/A 01/21/2019   Procedure: ABDOMINAL AORTIC ENDOVASCULAR STENT GRAFT WITH CO2;  Surgeon: Angelia Mould, MD;  Location: [facility];  Service: Vascular;  Laterality: N/A;  . ABDOMINAL AORTOGRAM W/LOWER EXTREMITY Bilateral 12/14/2018   Procedure: ABDOMINAL AORTOGRAM W/LOWER EXTREMITY;  Surgeon: Angelia Mould, MD;  Location: [facility];  Service: Cardiovascular;  Laterality: Bilateral;  . AV FISTULA PLACEMENT Left 03/12/2019   Procedure: LEFT ARTERIOVENOUS Arteriovenous FISTULA CREATION.;  Surgeon: Angelia Mould, MD;  Location: [facility];  Service: Vascular;  Laterality: Left;  . CATARACT EXTRACTION W/ INTRAOCULAR LENS  IMPLANT, BILATERAL    . CHOLECYSTECTOMY    . ESOPHAGEAL BANDING N/A 08/11/2017   Procedure: ESOPHAGEAL BANDING;  Surgeon: Otis Brace, MD;  Location: [facility];  Service: Gastroenterology;  Laterality: N/A;  . ESOPHAGEAL BANDING N/A 10/26/2017   Procedure: ESOPHAGEAL BANDING;  Surgeon: Otis Brace, MD;  Location: [facility];  Service: Gastroenterology;  Laterality: N/A;  . ESOPHAGEAL BANDING N/A 12/19/2017   Procedure: ESOPHAGEAL BANDING;  Surgeon: Otis Brace, MD;  Location: [facility];  Service: Gastroenterology;  Laterality: N/A;  . ESOPHAGOGASTRODUODENOSCOPY (EGD) WITH PROPOFOL N/A 07/05/2017   Procedure: ESOPHAGOGASTRODUODENOSCOPY (EGD) WITH PROPOFOL;  Surgeon: Otis Brace, MD;  Location: [facility];  Service: Gastroenterology;  Laterality: N/A;  . ESOPHAGOGASTRODUODENOSCOPY (EGD) WITH PROPOFOL N/A 08/11/2017   Procedure: ESOPHAGOGASTRODUODENOSCOPY (EGD) WITH PROPOFOL;  Surgeon: Otis Brace, MD;   Location: [facility];  Service: Gastroenterology;  Laterality: N/A;  . ESOPHAGOGASTRODUODENOSCOPY (EGD) WITH PROPOFOL N/A 10/26/2017   Procedure: ESOPHAGOGASTRODUODENOSCOPY (EGD) WITH PROPOFOL;  Surgeon: Otis Brace, MD;  [facility]: [facility];  Service: Gastroenterology;  Laterality: N/A;  . ESOPHAGOGASTRODUODENOSCOPY (EGD) WITH PROPOFOL N/A 12/19/2017   Procedure: ESOPHAGOGASTRODUODENOSCOPY (EGD) WITH PROPOFOL;  Surgeon: Otis Brace, MD;  [facility];  Service: Gastroenterology;  Laterality: N/A;  . ESOPHAGOGASTRODUODENOSCOPY (EGD) WITH PROPOFOL N/A 10/29/2018   Procedure: ESOPHAGOGASTRODUODENOSCOPY (EGD) WITH PROPOFOL;  Surgeon: Otis Brace, MD;  [facility];  Service: Gastroenterology;  Laterality: N/A;  . ESOPHAGOGASTRODUODENOSCOPY (EGD) WITH PROPOFOL N/A 02/14/2020   Procedure: ESOPHAGOGASTRODUODENOSCOPY (EGD) WITH PROPOFOL;  Surgeon: Otis Brace, MD;  Location: [facility];  Service: Gastroenterology;  Laterality: N/A;  . EYE SURGERY     bilateral cataract removal with lens placement  . HERNIA REPAIR    . IR PARACENTESIS  12/26/2018  . IR PARACENTESIS  01/02/2019  . IR PARACENTESIS  01/15/2019  . IR PARACENTESIS  01/30/2019  . IR PARACENTESIS  02/05/2019  . IR PARACENTESIS  02/14/2019  . IR PARACENTESIS  02/28/2019  . IR PARACENTESIS  03/14/2019  . IR PARACENTESIS  03/29/2019  . IR PARACENTESIS  04/11/2019  . IR PARACENTESIS  04/25/2019  . IR PARACENTESIS  05/09/2019  . IR PARACENTESIS  06/03/2019  . IR PARACENTESIS  06/06/2019  . IR PARACENTESIS  06/20/2019  . IR PARACENTESIS  07/03/2019  . IR PARACENTESIS  07/19/2019  . IR PARACENTESIS  08/08/2019  . IR PARACENTESIS  08/27/2019  . IR PARACENTESIS  09/09/2019  . IR PARACENTESIS  09/24/2019  . IR PARACENTESIS  10/07/2019  . IR PARACENTESIS  10/23/2019  . IR PARACENTESIS  11/07/2019  . IR PARACENTESIS  11/21/2019  . IR PARACENTESIS  12/04/2019  . IR PARACENTESIS  12/25/2019  . IR PARACENTESIS   01/10/2020  . IR PARACENTESIS  01/27/2020  . IR PARACENTESIS  02/19/2020  . IR PARACENTESIS  03/27/2020  . PROSTATECTOMY    . subtotal gastrectomy         Family History  Problem Relation Age of Onset  . Diabetes Mellitus II Mother   . Stroke Mother     Social History   Tobacco Use  . Smoking status: Never Smoker  . Smokeless tobacco: Never Used  Vaping Use  . Vaping Use: Never used  Substance Use Topics  . Alcohol use: Not Currently  . Drug use: Never    Home Medications Prior to Admission medications   Medication Sig Start Date End Date Taking? Authorizing Provider  calcitRIOL (ROCALTROL) 0.25 MCG capsule Take 1 capsule (0.25 mcg total) by mouth every Tuesday, Thursday, and Saturday at 6 PM. 02/20/20   Rai, Vernelle Emerald, MD  calcium acetate (PHOSLO) 667 MG capsule Take 1 capsule (667 mg total) by mouth 3 (three) times daily with meals. 02/20/20   Rai, Vernelle Emerald, MD  carvedilol (COREG) 12.5 MG tablet Take 12.5 mg by mouth 2 (two) times daily. 03/22/17   [provider]  cloNIDine (CATAPRES) 0.1 MG tablet Take 1 tablet (0.1 mg total) by mouth daily. Patient taking differently: Take 0.2 mg by mouth  daily. 09/28/19   Autry-Lott, Naaman Plummer, DO  colchicine 0.6 MG tablet Take 0.6 mg by mouth daily as needed (gout flare).  03/22/17   [provider]  febuxostat (ULORIC) 40 MG tablet Take 1 tablet (40 mg total) by mouth daily. 02/20/20   Rai, Ripudeep Raliegh Ip, MD  lactulose (CHRONULAC) 10 GM/15ML solution Take 15 mLs (10 g total) by mouth daily. 02/20/20   Rai, Vernelle Emerald, MD  levothyroxine (SYNTHROID) 25 MCG tablet Take 25 mcg by mouth daily. 07/20/19   [provider]  multivitamin (RENA-VIT) TABS tablet Take 1 tablet by mouth daily. Patient not taking: No sig reported 09/27/19   Autry-Lott, Naaman Plummer, DO  pantoprazole (PROTONIX) 40 MG tablet Take 1 tablet (40 mg total) by mouth daily. 02/20/20 04/20/20  Rai, Vernelle Emerald, MD  traMADol (ULTRAM) 50 MG tablet Take 1 tablet  (50 mg total) by mouth every 8 (eight) hours as needed for moderate pain. 03/12/19   Rhyne, Hulen Shouts, PA-C    Allergies    Cephalexin and Hydralazine  Review of Systems   Review of Systems  Constitutional:       Per HPI, otherwise negative  HENT:       Per HPI, otherwise negative  Respiratory:       Per HPI, otherwise negative  Cardiovascular:       Per HPI, otherwise negative  Gastrointestinal: Positive for abdominal pain, constipation, nausea and vomiting.  Endocrine:       Negative aside from HPI  Genitourinary:       Neg aside from HPI   Musculoskeletal:       Per HPI, otherwise negative  Skin: Negative.   Allergic/Immunologic: Positive for immunocompromised state.  Neurological: Negative for syncope.    Physical Exam Updated Vital Signs BP (!) 146/69   Pulse 85   Temp 98.5 F (36.9 C)   Resp 16   SpO2 97%   Physical Exam Vitals and nursing note reviewed.  Constitutional:      General: He is not in acute distress.    Appearance: He is well-developed.  HENT:     Head: Normocephalic and atraumatic.  Eyes:     Extraocular Movements: EOM normal.     Conjunctiva/sclera: Conjunctivae normal.  Cardiovascular:     Rate and Rhythm: Normal rate and regular rhythm.  Pulmonary:     Effort: Pulmonary effort is normal. No respiratory distress.     Breath sounds: No stridor.  Abdominal:     General: There is no distension.     Tenderness: There is abdominal tenderness. There is guarding.  Musculoskeletal:        General: No edema.  Skin:    General: Skin is warm and dry.     Comments: Left upper extremity dialysis graft appreciable with palpable thrill, mild ecchymotic changes  Neurological:     Mental Status: He is alert and oriented to person, place, and time.  Psychiatric:        Mood and Affect: Mood and affect normal.     ED Results / Procedures / Treatments   Labs (all labs ordered are listed, but only abnormal results are displayed) Labs Reviewed   COMPREHENSIVE METABOLIC PANEL - Abnormal; Notable for the following components:      Result Value   Sodium 134 (*)    Chloride 91 (*)    BUN 102 (*)    Creatinine, Ser 10.01 (*)    Calcium 13.2 (*)    Alkaline Phosphatase 258 (*)  GFR, Estimated 5 (*)    Anion gap 18 (*)    All other components within normal limits  CBC - Abnormal; Notable for the following components:   RBC 3.60 (*)    Hemoglobin 12.2 (*)    HCT 37.0 (*)    MCV 102.8 (*)    RDW 15.6 (*)    Platelets 126 (*)    All other components within normal limits  LIPASE, BLOOD  URINALYSIS, ROUTINE W REFLEX MICROSCOPIC    EKG EKG Interpretation  Date/Time:  Tuesday April 14 2020 11:49:39 EST Ventricular Rate:  82 PR Interval:    QRS Duration: 103 QT Interval:  372 QTC Calculation: 435 R Axis:   -66 Text Interpretation: Sinus rhythm LAD, consider left anterior fascicular block Probable anteroseptal infarct, recent ST elevation, consider inferior injury since last tracing no significant change Confirmed by Daleen Bo [phone]) on 04/14/2020 12:03:48 PM   Radiology DG Chest 2 View  Result Date: 04/14/2020 CLINICAL DATA:  Shortness of breath, abdominal pain EXAM: CHEST - 2 VIEW COMPARISON:  03/28/2020 FINDINGS: Mild elevation of the left hemidiaphragm. Heart and mediastinal contours are within normal limits. No focal opacities or effusions. No acute bony abnormality. Aortic atherosclerosis. IMPRESSION: No active cardiopulmonary disease. Electronically Signed   By: Rolm Baptise M.D.   On: 04/14/2020 12:16   CT Abdomen Pelvis W Contrast  Result Date: 04/14/2020 CLINICAL DATA:  Abdominal pain EXAM: CT ABDOMEN AND PELVIS WITH CONTRAST TECHNIQUE: Multidetector CT imaging of the abdomen and pelvis was performed using the standard protocol following bolus administration of intravenous contrast. CONTRAST:  54mL OMNIPAQUE IOHEXOL 300 MG/ML  SOLN COMPARISON:  CT 02/18/2020 FINDINGS: Lower chest: Lung bases are clear.  Hepatobiliary: Lobular contour of the liver. Postcholecystectomy. No biliary duct dilatation. Enlarged caudate lobe. Moderate volume of ascites surrounds the liver is decreased from comparison exam. Portal veins are patent. Pancreas: Pancreas is atrophic.  No duct dilatation. Spleen: Spleen is mildly enlarged at 13 cm not changed from prior. Splenic vein is patent. Adrenals/urinary tract: Adrenal glands normal. Kidneys demonstrate cortical thinning. No hydronephrosis. Ureters and bladder normal. A penile reservoir is in lower pelvis Stomach/Bowel: Stomach, duodenum small-bowel normal. No evidence of bowel obstruction. Appendix and cecum normal. Several diverticula of the LEFT colon without acute inflammation. Rectum normal. Vascular/Lymphatic: Saccular aneurysm of the infrarenal abdominal aorta measuring 5.4 cm compared to 5.5 cm. Stent graft repair pair of aneurysm unchanged. Reproductive: Prostate unremarkable Other: No abscess or inflammation. Musculoskeletal: No aggressive osseous lesion. IMPRESSION: 1. Interval decrease intraperitoneal free fluid compared to prior. 2. Cirrhotic liver morphology.  Portal veins are patent. 3. Mild splenomegaly. 4. No evidence of bowel obstruction or inflammation. 5. LEFT colon diverticulosis without diverticulitis. 6. Stable infrarenal abdominal aortic aneurysm (5.4 cm) with endograft repair. Electronically Signed   By: Suzy Bouchard M.D.   On: 04/14/2020 19:32    Procedures Procedures 8:43 PM On repeat exam patient is awake, alert, hemodynamically unremarkable. He is accompanied by his son. We discussed all findings including elevated calcium, otherwise reassuring CT abdomen pelvis, chest x-ray. He has no hypoxia, no hypotension, no increased work of breathing, there are some suspicion for the patient's weakness, abdominal pain being secondary to chronic conditions/new dialysis regimen. Discussed the importance of following up for dialysis tomorrow, following up with  nephrology to discuss his hypercalcemia. With no other alarming findings, generally reassuring labs otherwise, CT otherwise, x-ray otherwise, the patient is discharged in stable condition to follow-up tomorrow.  Medications Ordered in ED Medications  iohexol (OMNIPAQUE) 300 MG/ML solution 75 mL (75 mLs Intravenous Contrast Given 04/14/20 1836)    ED Course  I have reviewed the triage vital signs and the nursing notes.  Pertinent labs & imaging results that were available during my care of the patient were reviewed by me and considered in my medical decision making (see chart for details).  Final Clinical Impression(s) / ED Diagnoses Final diagnoses:  Generalized abdominal pain  Hypercalcemia    Rx / DC Orders ED Discharge Orders    None       Carmin Muskrat, MD 04/14/20 2045

## 2020-04-14 NOTE — ED Triage Notes (Signed)
Complains of SOB and abd pain x4 days, endorses cough in the mornings, denies CP. Had dialysis last week. Endorses N/V/D.

## 2020-04-14 NOTE — Discharge Instructions (Addendum)
As discussed, your evaluation today has been largely reassuring.  But, it is important that you monitor your condition carefully, and do not hesitate to return to the ED if you develop new, or concerning changes in your condition.  Otherwise, please follow-up with your physician for appropriate ongoing care.  Please attempt to have dialysis tomorrow.  Inform dialysis staff that her calcium levels today were elevated beyond normal (13).

## 2020-05-22 DIAGNOSIS — M5416 Radiculopathy, lumbar region: Secondary | ICD-10-CM | POA: Insufficient documentation

## 2020-07-13 DIAGNOSIS — E7800 Pure hypercholesterolemia, unspecified: Secondary | ICD-10-CM | POA: Insufficient documentation

## 2020-07-13 DIAGNOSIS — I8500 Esophageal varices without bleeding: Secondary | ICD-10-CM | POA: Insufficient documentation

## 2020-07-13 DIAGNOSIS — Z992 Dependence on renal dialysis: Secondary | ICD-10-CM | POA: Insufficient documentation

## 2020-09-14 ENCOUNTER — Other Ambulatory Visit: Payer: Self-pay | Admitting: Family Medicine

## 2020-09-15 ENCOUNTER — Encounter (HOSPITAL_COMMUNITY): Payer: Self-pay | Admitting: Emergency Medicine

## 2020-09-15 ENCOUNTER — Inpatient Hospital Stay (HOSPITAL_COMMUNITY)
Admission: EM | Admit: 2020-09-15 | Discharge: 2020-09-23 | DRG: 280 | Disposition: A | Discharge status: Home / Self Care | Payer: Medicare Other | Attending: Internal Medicine | Admitting: Internal Medicine

## 2020-09-15 ENCOUNTER — Other Ambulatory Visit: Payer: Self-pay

## 2020-09-15 ENCOUNTER — Emergency Department (HOSPITAL_COMMUNITY): Payer: Medicare Other

## 2020-09-15 DIAGNOSIS — N186 End stage renal disease: Secondary | ICD-10-CM | POA: Diagnosis present

## 2020-09-15 DIAGNOSIS — N2581 Secondary hyperparathyroidism of renal origin: Secondary | ICD-10-CM | POA: Diagnosis present

## 2020-09-15 DIAGNOSIS — I712 Thoracic aortic aneurysm, without rupture, unspecified: Secondary | ICD-10-CM | POA: Diagnosis present

## 2020-09-15 DIAGNOSIS — Z7189 Other specified counseling: Secondary | ICD-10-CM | POA: Diagnosis not present

## 2020-09-15 DIAGNOSIS — Z9842 Cataract extraction status, left eye: Secondary | ICD-10-CM

## 2020-09-15 DIAGNOSIS — I255 Ischemic cardiomyopathy: Secondary | ICD-10-CM | POA: Diagnosis present

## 2020-09-15 DIAGNOSIS — I25110 Atherosclerotic heart disease of native coronary artery with unstable angina pectoris: Secondary | ICD-10-CM | POA: Diagnosis present

## 2020-09-15 DIAGNOSIS — E8779 Other fluid overload: Secondary | ICD-10-CM | POA: Diagnosis not present

## 2020-09-15 DIAGNOSIS — I5021 Acute systolic (congestive) heart failure: Secondary | ICD-10-CM | POA: Diagnosis not present

## 2020-09-15 DIAGNOSIS — K7460 Unspecified cirrhosis of liver: Secondary | ICD-10-CM | POA: Diagnosis present

## 2020-09-15 DIAGNOSIS — Z8679 Personal history of other diseases of the circulatory system: Secondary | ICD-10-CM

## 2020-09-15 DIAGNOSIS — I272 Pulmonary hypertension, unspecified: Secondary | ICD-10-CM | POA: Diagnosis present

## 2020-09-15 DIAGNOSIS — D539 Nutritional anemia, unspecified: Secondary | ICD-10-CM | POA: Diagnosis present

## 2020-09-15 DIAGNOSIS — I4891 Unspecified atrial fibrillation: Secondary | ICD-10-CM | POA: Diagnosis not present

## 2020-09-15 DIAGNOSIS — D638 Anemia in other chronic diseases classified elsewhere: Secondary | ICD-10-CM | POA: Diagnosis present

## 2020-09-15 DIAGNOSIS — I480 Paroxysmal atrial fibrillation: Secondary | ICD-10-CM | POA: Diagnosis not present

## 2020-09-15 DIAGNOSIS — I132 Hypertensive heart and chronic kidney disease with heart failure and with stage 5 chronic kidney disease, or end stage renal disease: Principal | ICD-10-CM | POA: Diagnosis present

## 2020-09-15 DIAGNOSIS — Z961 Presence of intraocular lens: Secondary | ICD-10-CM | POA: Diagnosis present

## 2020-09-15 DIAGNOSIS — Z992 Dependence on renal dialysis: Secondary | ICD-10-CM | POA: Diagnosis not present

## 2020-09-15 DIAGNOSIS — Z6823 Body mass index (BMI) 23.0-23.9, adult: Secondary | ICD-10-CM

## 2020-09-15 DIAGNOSIS — Z20822 Contact with and (suspected) exposure to covid-19: Secondary | ICD-10-CM | POA: Diagnosis present

## 2020-09-15 DIAGNOSIS — Z888 Allergy status to other drugs, medicaments and biological substances status: Secondary | ICD-10-CM

## 2020-09-15 DIAGNOSIS — R188 Other ascites: Secondary | ICD-10-CM | POA: Diagnosis not present

## 2020-09-15 DIAGNOSIS — R7989 Other specified abnormal findings of blood chemistry: Secondary | ICD-10-CM | POA: Diagnosis present

## 2020-09-15 DIAGNOSIS — I214 Non-ST elevation (NSTEMI) myocardial infarction: Secondary | ICD-10-CM | POA: Diagnosis present

## 2020-09-15 DIAGNOSIS — M898X9 Other specified disorders of bone, unspecified site: Secondary | ICD-10-CM | POA: Diagnosis present

## 2020-09-15 DIAGNOSIS — J90 Pleural effusion, not elsewhere classified: Secondary | ICD-10-CM | POA: Diagnosis present

## 2020-09-15 DIAGNOSIS — D696 Thrombocytopenia, unspecified: Secondary | ICD-10-CM | POA: Diagnosis present

## 2020-09-15 DIAGNOSIS — J189 Pneumonia, unspecified organism: Secondary | ICD-10-CM | POA: Diagnosis present

## 2020-09-15 DIAGNOSIS — I2510 Atherosclerotic heart disease of native coronary artery without angina pectoris: Secondary | ICD-10-CM | POA: Diagnosis not present

## 2020-09-15 DIAGNOSIS — Z66 Do not resuscitate: Secondary | ICD-10-CM | POA: Diagnosis present

## 2020-09-15 DIAGNOSIS — R778 Other specified abnormalities of plasma proteins: Secondary | ICD-10-CM | POA: Diagnosis not present

## 2020-09-15 DIAGNOSIS — I350 Nonrheumatic aortic (valve) stenosis: Secondary | ICD-10-CM | POA: Diagnosis not present

## 2020-09-15 DIAGNOSIS — I10 Essential (primary) hypertension: Secondary | ICD-10-CM | POA: Diagnosis present

## 2020-09-15 DIAGNOSIS — I714 Abdominal aortic aneurysm, without rupture, unspecified: Secondary | ICD-10-CM | POA: Diagnosis present

## 2020-09-15 DIAGNOSIS — E440 Moderate protein-calorie malnutrition: Secondary | ICD-10-CM | POA: Diagnosis present

## 2020-09-15 DIAGNOSIS — Z7901 Long term (current) use of anticoagulants: Secondary | ICD-10-CM

## 2020-09-15 DIAGNOSIS — K7469 Other cirrhosis of liver: Secondary | ICD-10-CM | POA: Diagnosis not present

## 2020-09-15 DIAGNOSIS — K766 Portal hypertension: Secondary | ICD-10-CM | POA: Diagnosis present

## 2020-09-15 DIAGNOSIS — R079 Chest pain, unspecified: Secondary | ICD-10-CM

## 2020-09-15 DIAGNOSIS — I5043 Acute on chronic combined systolic (congestive) and diastolic (congestive) heart failure: Secondary | ICD-10-CM | POA: Diagnosis present

## 2020-09-15 DIAGNOSIS — E7800 Pure hypercholesterolemia, unspecified: Secondary | ICD-10-CM | POA: Diagnosis present

## 2020-09-15 DIAGNOSIS — Z515 Encounter for palliative care: Secondary | ICD-10-CM | POA: Diagnosis not present

## 2020-09-15 DIAGNOSIS — Z79899 Other long term (current) drug therapy: Secondary | ICD-10-CM

## 2020-09-15 DIAGNOSIS — Z9841 Cataract extraction status, right eye: Secondary | ICD-10-CM

## 2020-09-15 DIAGNOSIS — Z881 Allergy status to other antibiotic agents status: Secondary | ICD-10-CM

## 2020-09-15 DIAGNOSIS — E039 Hypothyroidism, unspecified: Secondary | ICD-10-CM | POA: Diagnosis present

## 2020-09-15 DIAGNOSIS — Z833 Family history of diabetes mellitus: Secondary | ICD-10-CM

## 2020-09-15 DIAGNOSIS — I7100 Dissection of unspecified site of aorta: Secondary | ICD-10-CM | POA: Diagnosis present

## 2020-09-15 DIAGNOSIS — E876 Hypokalemia: Secondary | ICD-10-CM | POA: Diagnosis not present

## 2020-09-15 DIAGNOSIS — I5031 Acute diastolic (congestive) heart failure: Secondary | ICD-10-CM | POA: Diagnosis not present

## 2020-09-15 DIAGNOSIS — Z823 Family history of stroke: Secondary | ICD-10-CM

## 2020-09-15 DIAGNOSIS — I25118 Atherosclerotic heart disease of native coronary artery with other forms of angina pectoris: Secondary | ICD-10-CM | POA: Diagnosis not present

## 2020-09-15 DIAGNOSIS — E8770 Fluid overload, unspecified: Secondary | ICD-10-CM | POA: Diagnosis present

## 2020-09-15 LAB — PROTIME-INR
INR: 1.2 (ref 0.8–1.2)
Prothrombin Time: 15.3 seconds — ABNORMAL HIGH (ref 11.4–15.2)

## 2020-09-15 LAB — CBC
HCT: 30.4 % — ABNORMAL LOW (ref 39.0–52.0)
Hemoglobin: 9.8 g/dL — ABNORMAL LOW (ref 13.0–17.0)
MCH: 34 pg (ref 26.0–34.0)
MCHC: 32.2 g/dL (ref 30.0–36.0)
MCV: 105.6 fL — ABNORMAL HIGH (ref 80.0–100.0)
Platelets: 91 10*3/uL — ABNORMAL LOW (ref 150–400)
RBC: 2.88 MIL/uL — ABNORMAL LOW (ref 4.22–5.81)
RDW: 16.8 % — ABNORMAL HIGH (ref 11.5–15.5)
WBC: 8.2 10*3/uL (ref 4.0–10.5)
nRBC: 0 % (ref 0.0–0.2)

## 2020-09-15 LAB — CBC WITH DIFFERENTIAL/PLATELET
Abs Immature Granulocytes: 0.02 10*3/uL (ref 0.00–0.07)
Basophils Absolute: 0 10*3/uL (ref 0.0–0.1)
Basophils Relative: 1 %
Eosinophils Absolute: 0.1 10*3/uL (ref 0.0–0.5)
Eosinophils Relative: 1 %
HCT: 33.2 % — ABNORMAL LOW (ref 39.0–52.0)
Hemoglobin: 10.2 g/dL — ABNORMAL LOW (ref 13.0–17.0)
Immature Granulocytes: 0 %
Lymphocytes Relative: 6 %
Lymphs Abs: 0.4 10*3/uL — ABNORMAL LOW (ref 0.7–4.0)
MCH: 33.2 pg (ref 26.0–34.0)
MCHC: 30.7 g/dL (ref 30.0–36.0)
MCV: 108.1 fL — ABNORMAL HIGH (ref 80.0–100.0)
Monocytes Absolute: 0.4 10*3/uL (ref 0.1–1.0)
Monocytes Relative: 7 %
Neutro Abs: 5.8 10*3/uL (ref 1.7–7.7)
Neutrophils Relative %: 85 %
Platelets: 90 10*3/uL — ABNORMAL LOW (ref 150–400)
RBC: 3.07 MIL/uL — ABNORMAL LOW (ref 4.22–5.81)
RDW: 16.9 % — ABNORMAL HIGH (ref 11.5–15.5)
WBC: 6.7 10*3/uL (ref 4.0–10.5)
nRBC: 0 % (ref 0.0–0.2)

## 2020-09-15 LAB — RESP PANEL BY RT-PCR (FLU A&B, COVID) ARPGX2
Influenza A by PCR: NEGATIVE
Influenza B by PCR: NEGATIVE
SARS Coronavirus 2 by RT PCR: NEGATIVE

## 2020-09-15 LAB — COMPREHENSIVE METABOLIC PANEL
ALT: 14 U/L (ref 0–44)
AST: 25 U/L (ref 15–41)
Albumin: 3.4 g/dL — ABNORMAL LOW (ref 3.5–5.0)
Alkaline Phosphatase: 108 U/L (ref 38–126)
Anion gap: 12 (ref 5–15)
BUN: 29 mg/dL — ABNORMAL HIGH (ref 8–23)
CO2: 27 mmol/L (ref 22–32)
Calcium: 8.3 mg/dL — ABNORMAL LOW (ref 8.9–10.3)
Chloride: 101 mmol/L (ref 98–111)
Creatinine, Ser: 7.1 mg/dL — ABNORMAL HIGH (ref 0.61–1.24)
GFR, Estimated: 7 mL/min — ABNORMAL LOW (ref >60)
Glucose, Bld: 82 mg/dL (ref 70–99)
Potassium: 3.2 mmol/L — ABNORMAL LOW (ref 3.5–5.1)
Sodium: 140 mmol/L (ref 135–145)
Total Bilirubin: 1 mg/dL (ref 0.3–1.2)
Total Protein: 6.4 g/dL — ABNORMAL LOW (ref 6.5–8.1)

## 2020-09-15 LAB — RENAL FUNCTION PANEL
Albumin: 2.8 g/dL — ABNORMAL LOW (ref 3.5–5.0)
Anion gap: 14 (ref 5–15)
BUN: 31 mg/dL — ABNORMAL HIGH (ref 8–23)
CO2: 24 mmol/L (ref 22–32)
Calcium: 8 mg/dL — ABNORMAL LOW (ref 8.9–10.3)
Chloride: 99 mmol/L (ref 98–111)
Creatinine, Ser: 7.61 mg/dL — ABNORMAL HIGH (ref 0.61–1.24)
GFR, Estimated: 7 mL/min — ABNORMAL LOW (ref >60)
Glucose, Bld: 90 mg/dL (ref 70–99)
Phosphorus: 4.2 mg/dL (ref 2.5–4.6)
Potassium: 3.4 mmol/L — ABNORMAL LOW (ref 3.5–5.1)
Sodium: 137 mmol/L (ref 135–145)

## 2020-09-15 LAB — TROPONIN I (HIGH SENSITIVITY)
Troponin I (High Sensitivity): 96 ng/L — ABNORMAL HIGH (ref <18)
Troponin I (High Sensitivity): 87 ng/L — ABNORMAL HIGH (ref <18)

## 2020-09-15 LAB — LIPASE, BLOOD: Lipase: 27 U/L (ref 11–51)

## 2020-09-15 LAB — BRAIN NATRIURETIC PEPTIDE: B Natriuretic Peptide: >4500 pg/mL — ABNORMAL HIGH (ref 0.0–100.0)

## 2020-09-15 MED ORDER — LIDOCAINE HCL (PF) 1 % IJ SOLN: 5.0000 mL | INTRAMUSCULAR | Status: DC | PRN

## 2020-09-15 MED ORDER — SODIUM CHLORIDE 0.9 % IV SOLN: 250.0000 mL | INTRAVENOUS | Status: DC | PRN

## 2020-09-15 MED ORDER — ACETAMINOPHEN 325 MG PO TABS
650.0000 mg | ORAL_TABLET | ORAL | Status: DC | PRN
  Filled 2020-09-15: qty 2

## 2020-09-15 MED ORDER — PENTAFLUOROPROP-TETRAFLUOROETH EX AERO
1.0000 "application " | INHALATION_SPRAY | CUTANEOUS | Status: DC | PRN
  Filled 2020-09-15: qty 116

## 2020-09-15 MED ORDER — FENTANYL CITRATE (PF) 100 MCG/2ML IJ SOLN
50.0000 ug | Freq: Once | INTRAMUSCULAR | Status: AC
  Administered 2020-09-15: 50 ug via INTRAVENOUS
  Filled 2020-09-15: qty 2

## 2020-09-15 MED ORDER — FENTANYL CITRATE (PF) 100 MCG/2ML IJ SOLN: 12.5000 ug | INTRAMUSCULAR | Status: DC | PRN

## 2020-09-15 MED ORDER — SODIUM CHLORIDE 0.9 % IV SOLN: 100.0000 mL | INTRAVENOUS | Status: DC | PRN

## 2020-09-15 MED ORDER — LIDOCAINE-PRILOCAINE 2.5-2.5 % EX CREA
1.0000 "application " | TOPICAL_CREAM | CUTANEOUS | Status: DC | PRN
  Filled 2020-09-15: qty 5

## 2020-09-15 MED ORDER — IOHEXOL 350 MG/ML SOLN
100.0000 mL | Freq: Once | INTRAVENOUS | Status: AC | PRN
  Administered 2020-09-15: 100 mL via INTRAVENOUS

## 2020-09-15 MED ORDER — HEPARIN SODIUM (PORCINE) 5000 UNIT/ML IJ SOLN
5000.0000 [IU] | Freq: Three times a day (TID) | INTRAMUSCULAR | Status: DC
  Administered 2020-09-15 – 2020-09-18 (×5): 5000 [IU] via SUBCUTANEOUS
  Filled 2020-09-15 (×5): qty 1

## 2020-09-15 MED ORDER — LIDOCAINE 5 % EX PTCH
1.0000 | MEDICATED_PATCH | CUTANEOUS | Status: DC
  Administered 2020-09-15 – 2020-09-21 (×7): 1 via TRANSDERMAL
  Filled 2020-09-15 (×8): qty 1

## 2020-09-15 MED ORDER — CHLORHEXIDINE GLUCONATE CLOTH 2 % EX PADS
6.0000 | MEDICATED_PAD | Freq: Every day | CUTANEOUS | Status: DC
  Administered 2020-09-17 – 2020-09-18 (×2): 6 via TOPICAL

## 2020-09-15 MED ORDER — ONDANSETRON HCL 4 MG/2ML IJ SOLN: 4.0000 mg | Freq: Four times a day (QID) | INTRAMUSCULAR | Status: DC | PRN

## 2020-09-15 MED ORDER — ALTEPLASE 2 MG IJ SOLR
2.0000 mg | Freq: Once | INTRAMUSCULAR | Status: DC | PRN
  Filled 2020-09-15: qty 2

## 2020-09-15 MED ORDER — HEPARIN SODIUM (PORCINE) 1000 UNIT/ML DIALYSIS
1000.0000 [IU] | INTRAMUSCULAR | Status: DC | PRN
  Filled 2020-09-15: qty 1

## 2020-09-15 NOTE — Progress Notes (Signed)
Assess patient site. Instructed on procedure. Removed dialysis needles. Held pressure for 15 min, applied pressure drsg. Tech took over holding pressure instructed for another 5-73min to confirm bleeding had stopped. Notified nurse. Fran Lowes, RN VAST

## 2020-09-15 NOTE — ED Provider Notes (Signed)
[place] EMERGENCY DEPARTMENT Provider Note   CSN: [id number] Arrival date & time: 09/15/20  1309     History Chief Complaint  Patient presents with   Chest Pain    Ricky Harris is a 80 y.o. male.  The history is provided by the patient and medical records.  Chest Pain Ricky Harris is a 80 y.o. male who presents to the Emergency Department complaining of chest pain.  He presents to the ED accompanied by his son for evaluation of one week of sharp, right sided chest pain.  Pain is constant in nature, nonradiating, worse today.  Pain is worse with breathing. Has associated two weeks of emesis - one episode every morning.    No AP, fever.  Has mild sob.  Has cough - nonproductive.  Has diarrhea 1.5 weeks ago - improving.  No leg swelling/pain.  No prior similar sxs.  Has ESRD - Diaylzes T, TH, sat.  Received 38mn of dialysis today and his pain significantly worsened.  Not on anticoagulation.  No hx/o DVT/PE.  No recent injuries/accidents.       Past Medical History:  Diagnosis Date   AAA (abdominal aortic aneurysm) ([place]    5.3cm , magd by vascular Dr DScot Dock,   Anemia    " slightly"   Aortic stenosis    Arthritis    knees   Ascites    Cancer ([facility]    prostate   Chronic kidney disease    stage 4, mgd by Dr WJustin Mend   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([facility]    Coronary artery disease    on CT scan   Gout    H/O hypercholesterolemia    Heart murmur    History of falling    recent- see ct chest results of 04/25/2018   Hypertension    Hypothyroidism    Liver cirrhosis ([place]    Prediabetes    pt denies    Renal artery stenosis (HCC)    Thrombocytopenia ([place]    Wears glasses    Wears glasses     Patient Active Problem List   Diagnosis Date Noted   Bilateral pleural effusion 09/15/2020   Thoracic aortic aneurysm ([place] 09/15/2020   Elevated troponin 09/15/2020   Right-sided chest pain 09/15/2020   Dependence on renal dialysis ([place]  07/13/2020   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   Pure hypercholesterolemia 07/13/2020   Lumbar radiculopathy 05/22/2020   Hypercalcemia 04/25/2020   Fluid overload, unspecified 04/14/2020   Impaired cognition 02/26/2020   ESRD (end stage renal disease) ([place] 02/18/2020   Dyspnea 02/18/2020   Encounter for immunization 11/19/2019   Allergy, unspecified, initial encounter 09/27/2019   Iron deficiency anemia, unspecified 09/27/2019   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   Unspecified protein-calorie malnutrition (HL[place] 09/27/2019   Diarrhea, unspecified 09/26/2019   Pain, unspecified 09/26/2019   Hyperkalemia 09/23/2019   CKD (chronic kidney disease), stage V ([place] 09/23/2019   Acute kidney insufficiency 09/23/2019   Pruritus 04/15/2019   Senile purpura ([place] 04/15/2019   Abdominal aortic aneurysm (AAA) ([place] 01/21/2019   AAA (abdominal aortic aneurysm) without rupture ([place] 01/21/2019   Trochanteric bursitis of left hip 12/27/2018   Cirrhosis of liver ([place] 05/29/2017   Ascites 05/20/2017   Normochromic normocytic anemia 05/20/2017   Essential hypertension 05/20/2017   CKD (chronic kidney disease) stage 3, GFR 30-59 ml/min (HCC) 05/20/2017   HLD (hyperlipidemia) 05/20/2017   Hypothyroidism 05/20/2017   Aortic  valve sclerosis 02/23/2017   Coronary artery calcification seen on CAT scan 02/23/2017   Atherosclerosis of abdominal aorta ([place]) 12/02/2016   Left hip pain 06/13/2016   Senile nuclear sclerosis 04/05/2016   Bilateral chronic knee pain 03/11/2016   Thrombocytopenia ([place]) 11/21/2015   H/O peptic ulcer 05/13/2015   History of prostate cancer 05/13/2015   Vitamin B12 deficiency 05/13/2015   Benign neoplasm of colon 04/01/2015   Degeneration of lumbar intervertebral disc 04/01/2015   Erectile dysfunction 04/01/2015   Gouty arthropathy 04/01/2015   Osteoarthritis 04/01/2015   Prediabetes 04/01/2015   Renal artery stenosis ([place])  04/01/2015    Past Surgical History:  Procedure Laterality Date   ABDOMINAL AORTIC ENDOVASCULAR STENT GRAFT N/A 01/21/2019   Procedure: ABDOMINAL AORTIC ENDOVASCULAR STENT GRAFT WITH CO2;  Surgeon: Angelia Mould, MD;  Location: [facility];  Service: Vascular;  Laterality: N/A;   ABDOMINAL AORTOGRAM W/LOWER EXTREMITY Bilateral 12/14/2018   Procedure: ABDOMINAL AORTOGRAM W/LOWER EXTREMITY;  Surgeon: Angelia Mould, MD;  Location: [facility];  Service: Cardiovascular;  Laterality: Bilateral;   AV FISTULA PLACEMENT Left 03/12/2019   Procedure: LEFT ARTERIOVENOUS Arteriovenous FISTULA CREATION.;  Surgeon: Angelia Mould, MD;  Location: [facility] OR;  Service: Vascular;  Laterality: Left;   CATARACT EXTRACTION W/ INTRAOCULAR LENS  IMPLANT, BILATERAL     CHOLECYSTECTOMY     ESOPHAGEAL BANDING N/A 08/11/2017   Procedure: ESOPHAGEAL BANDING;  Surgeon: Otis Brace, MD;  Location: [place];  Service: Gastroenterology;  Laterality: N/A;   ESOPHAGEAL BANDING N/A 10/26/2017   Procedure: ESOPHAGEAL BANDING;  Surgeon: Otis Brace, MD;  Location: [facility];  Service: Gastroenterology;  Laterality: N/A;   ESOPHAGEAL BANDING N/A 12/19/2017   Procedure: ESOPHAGEAL BANDING;  Surgeon: Otis Brace, MD;  Location: [facility];  Service: Gastroenterology;  Laterality: N/A;   ESOPHAGOGASTRODUODENOSCOPY (EGD) WITH PROPOFOL N/A 07/05/2017   Procedure: ESOPHAGOGASTRODUODENOSCOPY (EGD) WITH PROPOFOL;  Surgeon: Otis Brace, MD;  Location: [facility];  Service: Gastroenterology;  Laterality: N/A;   ESOPHAGOGASTRODUODENOSCOPY (EGD) WITH PROPOFOL N/A 08/11/2017   Procedure: ESOPHAGOGASTRODUODENOSCOPY (EGD) WITH PROPOFOL;  Surgeon: Otis Brace, MD;  Location: [facility];  Service: Gastroenterology;  Laterality: N/A;   ESOPHAGOGASTRODUODENOSCOPY (EGD) WITH PROPOFOL N/A 10/26/2017   Procedure: ESOPHAGOGASTRODUODENOSCOPY (EGD) WITH PROPOFOL;  Surgeon: Otis Brace, MD;   [facility];  Service: Gastroenterology;  Laterality: N/A;   ESOPHAGOGASTRODUODENOSCOPY (EGD) WITH PROPOFOL N/A 12/19/2017   Procedure: ESOPHAGOGASTRODUODENOSCOPY (EGD) WITH PROPOFOL;  Surgeon: Otis Brace, MD;  [facility];  Service: Gastroenterology;  Laterality: N/A;   ESOPHAGOGASTRODUODENOSCOPY (EGD) WITH PROPOFOL N/A 10/29/2018   Procedure: ESOPHAGOGASTRODUODENOSCOPY (EGD) WITH PROPOFOL;  Surgeon: Otis Brace, MD;  [facility];  Service: Gastroenterology;  Laterality: N/A;   ESOPHAGOGASTRODUODENOSCOPY (EGD) WITH PROPOFOL N/A 02/14/2020   Procedure: ESOPHAGOGASTRODUODENOSCOPY (EGD) WITH PROPOFOL;  Surgeon: Otis Brace, MD;  [facility];  Service: Gastroenterology;  Laterality: N/A;   EYE SURGERY     bilateral cataract removal with lens placement   HERNIA REPAIR     IR PARACENTESIS  12/26/2018   IR PARACENTESIS  01/02/2019   IR PARACENTESIS  01/15/2019   IR PARACENTESIS  01/30/2019   IR PARACENTESIS  02/05/2019   IR PARACENTESIS  02/14/2019   IR PARACENTESIS  02/28/2019   IR PARACENTESIS  03/14/2019   IR PARACENTESIS  03/29/2019   IR PARACENTESIS  04/11/2019   IR PARACENTESIS  04/25/2019   IR PARACENTESIS  05/09/2019   IR PARACENTESIS  06/03/2019   IR PARACENTESIS  06/06/2019   IR PARACENTESIS  06/20/2019  IR PARACENTESIS  07/03/2019   IR PARACENTESIS  07/19/2019   IR PARACENTESIS  08/08/2019   IR PARACENTESIS  08/27/2019   IR PARACENTESIS  09/09/2019   IR PARACENTESIS  09/24/2019   IR PARACENTESIS  10/07/2019   IR PARACENTESIS  10/23/2019   IR PARACENTESIS  11/07/2019   IR PARACENTESIS  11/21/2019   IR PARACENTESIS  12/04/2019   IR PARACENTESIS  12/25/2019   IR PARACENTESIS  01/10/2020   IR PARACENTESIS  01/27/2020   IR PARACENTESIS  02/19/2020   IR PARACENTESIS  03/27/2020   PROSTATECTOMY     subtotal gastrectomy         Family History  Problem Relation Age of Onset   Diabetes Mellitus II Mother    Stroke Mother     Social  History   Tobacco Use   Smoking status: Never   Smokeless tobacco: Never  Vaping Use   Vaping Use: Never used  Substance Use Topics   Alcohol use: Not Currently   Drug use: Never    Home Medications Prior to Admission medications   Medication Sig Start Date End Date Taking? Authorizing Provider  calcitRIOL (ROCALTROL) 0.25 MCG capsule Take 1 capsule (0.25 mcg total) by mouth every Tuesday, Thursday, and Saturday at 6 PM. Patient not taking: No sig reported 02/20/20   Rai, Ripudeep K, MD  calcium acetate (PHOSLO) 667 MG capsule Take 1 capsule (667 mg total) by mouth 3 (three) times daily with meals. 02/20/20   Rai, Vernelle Emerald, MD  carvedilol (COREG) 12.5 MG tablet Take 12.5 mg by mouth 2 (two) times daily. Patient not taking: Reported on 04/14/2020 03/22/17   [provider]  cloNIDine (CATAPRES) 0.1 MG tablet Take 1 tablet (0.1 mg total) by mouth daily. Patient not taking: No sig reported 09/28/19   Autry-Lott, Naaman Plummer, DO  febuxostat (ULORIC) 40 MG tablet Take 1 tablet (40 mg total) by mouth daily. Patient not taking: No sig reported 02/20/20   Rai, Ripudeep K, MD  lactulose (CHRONULAC) 10 GM/15ML solution Take 15 mLs (10 g total) by mouth daily. 02/20/20   Rai, Vernelle Emerald, MD  multivitamin (RENA-VIT) TABS tablet Take 1 tablet by mouth daily. 09/27/19   Autry-Lott, Naaman Plummer, DO  pantoprazole (PROTONIX) 40 MG tablet Take 1 tablet (40 mg total) by mouth daily. Patient not taking: No sig reported 02/20/20 04/20/20  Rai, Vernelle Emerald, MD  traMADol (ULTRAM) 50 MG tablet Take 1 tablet (50 mg total) by mouth every 8 (eight) hours as needed for moderate pain. 03/12/19   Rhyne, Hulen Shouts, PA-C    Allergies    Cephalexin and Hydralazine  Review of Systems   Review of Systems  Cardiovascular:  Positive for chest pain.  All other systems reviewed and are negative.  Physical Exam Updated Vital Signs BP 117/69   Pulse 95   Temp (!) 97.5 F (36.4 C) (Oral)   Resp (!) 24   Ht 6' (1.829  m)   Wt 77 kg   SpO2 94%   BMI 23.02 kg/m   Physical Exam Vitals and nursing note reviewed.  Constitutional:      Appearance: He is well-developed.  HENT:     Head: Normocephalic and atraumatic.  Cardiovascular:     Rate and Rhythm: Normal rate and regular rhythm.     Heart sounds: No murmur heard. Pulmonary:     Effort: Pulmonary effort is normal. No respiratory distress.     Breath sounds: Normal breath sounds.  Chest:     Chest  wall: Tenderness present.  Abdominal:     Palpations: Abdomen is soft.     Tenderness: There is abdominal tenderness. There is no guarding or rebound.     Comments: Moderate right sided abdominal tenderness  Musculoskeletal:        General: No swelling or tenderness.     Comments: Fistula in LUE, dressing in place  Skin:    General: Skin is warm and dry.  Neurological:     Mental Status: He is alert and oriented to person, place, and time.  Psychiatric:        Behavior: Behavior normal.    ED Results / Procedures / Treatments   Labs (all labs ordered are listed, but only abnormal results are displayed) Labs Reviewed  COMPREHENSIVE METABOLIC PANEL - Abnormal; Notable for the following components:      Result Value   Potassium 3.2 (*)    BUN 29 (*)    Creatinine, Ser 7.10 (*)    Calcium 8.3 (*)    Total Protein 6.4 (*)    Albumin 3.4 (*)    GFR, Estimated 7 (*)    All other components within normal limits  CBC WITH DIFFERENTIAL/PLATELET - Abnormal; Notable for the following components:   RBC 3.07 (*)    Hemoglobin 10.2 (*)    HCT 33.2 (*)    MCV 108.1 (*)    RDW 16.9 (*)    Platelets 90 (*)    Lymphs Abs 0.4 (*)    All other components within normal limits  PROTIME-INR - Abnormal; Notable for the following components:   Prothrombin Time 15.3 (*)    All other components within normal limits  BRAIN NATRIURETIC PEPTIDE - Abnormal; Notable for the following components:   B Natriuretic Peptide >4,500.0 (*)    All other components  within normal limits  RENAL FUNCTION PANEL - Abnormal; Notable for the following components:   Potassium 3.4 (*)    BUN 31 (*)    Creatinine, Ser 7.61 (*)    Calcium 8.0 (*)    Albumin 2.8 (*)    GFR, Estimated 7 (*)    All other components within normal limits  CBC - Abnormal; Notable for the following components:   RBC 2.88 (*)    Hemoglobin 9.8 (*)    HCT 30.4 (*)    MCV 105.6 (*)    RDW 16.8 (*)    Platelets 91 (*)    All other components within normal limits  TROPONIN I (HIGH SENSITIVITY) - Abnormal; Notable for the following components:   Troponin I (High Sensitivity) 96 (*)    All other components within normal limits  TROPONIN I (HIGH SENSITIVITY) - Abnormal; Notable for the following components:   Troponin I (High Sensitivity) 87 (*)    All other components within normal limits  RESP PANEL BY RT-PCR (FLU A&B, COVID) ARPGX2  LIPASE, BLOOD  BASIC METABOLIC PANEL  CBC    EKG EKG Interpretation  Date/Time:  Tuesday September 15 2020 13:15:41 EDT Ventricular Rate:  93 PR Interval:  172 QRS Duration: 108 QT Interval:  389 QTC Calculation: 484 R Axis:   -30 Text Interpretation: Sinus rhythm Probable left atrial enlargement Left axis deviation Anterior infarct, old Nonspecific T abnormalities, lateral leads Confirmed by Quintella Reichert [id number]) on 09/15/2020 3:00:13 PM  Radiology DG Chest Port 1 View  Result Date: 09/15/2020 CLINICAL DATA:  Onset right chest pain during dialysis today. EXAM: PORTABLE CHEST 1 VIEW COMPARISON:  PA and lateral chest 04/14/2020. FINDINGS:  There is bilateral airspace disease and left greater than right pleural effusions. Cardiomegaly. Aortic atherosclerosis. No pneumothorax. No acute or focal bony abnormality. IMPRESSION: Left greater than right pleural effusions and airspace disease have an appearance most compatible with congestive failure but could reflect pneumonia. Cardiomegaly. Aortic Atherosclerosis (ICD10-I70.0). Electronically Signed   By:  Inge Rise M.D.   On: 09/15/2020 16:03   CT Angio Chest/Abd/Pel for Dissection W and/or W/WO  Result Date: 09/15/2020 CLINICAL DATA:  Abdominal pain, suspected aortic dissection EXAM: CT ANGIOGRAPHY CHEST, ABDOMEN AND PELVIS TECHNIQUE: Multidetector CT imaging through the chest, abdomen and pelvis was performed using the standard protocol during bolus administration of intravenous contrast. Multiplanar reconstructed images and MIPs were obtained and reviewed to evaluate the vascular anatomy. CONTRAST:  153m OMNIPAQUE IOHEXOL 350 MG/ML SOLN COMPARISON:  CT 04/14/2020 and previous FINDINGS: CTA CHEST FINDINGS Cardiovascular: 4-chamber mild cardiomegaly has developed. No pericardial effusion. Satisfactory opacification of pulmonary arteries noted, and there is no evidence of pulmonary emboli. Coarse coronary calcifications. Good contrast opacification of the thoracic aorta, without dissection or aneurysm. Moderate calcified atheromatous plaque in the arch and descending thoracic segment. 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 Aortic Root: --Valve: 2.7 cm with coarse calcifications on the leaflets --Sinuses: 3.6 cm --Sinotubular Junction: 3.1 cm Limitations by motion: Moderate Thoracic Aorta: --Ascending Aorta: 4.1   cm --Aortic Arch: 4 cm --Descending Aorta: 3.3 cm Mediastinum/Nodes: No mass or adenopathy. Lungs/Pleura: Moderate pleural effusions have developed. There are extensive opacities throughout the left lower lobe, and dependent atelectasis/consolidation in the right lower lobe. No pneumothorax. Musculoskeletal: Anterior vertebral endplate spurring at multiple levels in the lower thoracic spine. Left shoulder DJD. 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 Review of the MIP images confirms the above findings. CTA ABDOMEN AND PELVIS FINDINGS VASCULAR Aorta: Patent infrarenal bifurcated aortic stent graft as before, with mild narrowing of  the left limb, stable. Native sac diameter 6.1 x 6 cm maximum transverse dimensions (previously 5.9 by my measurement). Limited assessment for endoleak on this single phase study. Celiac: Patent.  Splenic artery diffusely calcified. SMA: Patent without evidence of aneurysm, dissection, vasculitis or significant stenosis. Renals: Single bilaterally, both with heavily calcified ostial plaque resulting in short segment stenosis of at least moderate severity, patent distally. IMA: Proximal occlusion, reconstituted distally by visceral collaterals. Inflow: Right limb of the stent graft extends to the distal common iliac, well apposed. Left limb extends to distal common iliac, well apposed. Calcified plaque in the native internal iliac arteries without aneurysm. Native external iliac arteries are mildly tortuous, patent. Veins: No obvious venous abnormality within the limitations of this arterial phase study. Review of the MIP images confirms the above findings. NON-VASCULAR Hepatobiliary: No focal liver abnormality is seen. Status post cholecystectomy. No biliary dilatation. Pancreas: Unremarkable. No pancreatic ductal dilatation or surrounding inflammatory changes. Spleen: Normal in size without focal abnormality. Adrenals/Urinary Tract: Adrenal glands unremarkable. Bilateral renal parenchymal atrophy. No hydronephrosis. 2.5 cm exophytic probable cyst from the mid right kidney, stable. Urinary bladder is incompletely distended. Stomach/Bowel: Stomach is partially distended, unremarkable. The small bowel is decompressed. Normal appendix. Innumerable descending and sigmoid diverticula. Lymphatic: No abdominal or pelvic adenopathy. Reproductive: Penile pump components stable position. Previous prostatectomy. Other: Small volume abdominal ascites, new since previous. No free air. Musculoskeletal: Spondylitic changes in the lower lumbar spine. Left hip DJD. No displaced fracture or worrisome bone lesion. Review of the MIP  images confirms the above findings. IMPRESSION: 1. Negative for acute  PE or thoracic aortic dissection. 2. 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 Recommend annual imaging followup by CTA or MRA. This recommendation follows 2010 ACCF/AHA/AATS/ACR/ASA/SCA/SCAI/SIR/STS/SVM Guidelines for the Diagnosis and Management of Patients with Thoracic Aortic Disease. Circulation. 2010; 121: W967-R916. 3. New moderate pleural effusions with bilateral pulmonary opacities. 4. Small volume  Abdominal ascites. 5. Patent aortic stent graft with 6.1 cm native sac diameter (previously 5.9). 6. Descending and sigmoid diverticulosis Electronically Signed   By: Lucrezia Europe M.D.   On: 09/15/2020 17:32    Procedures Procedures   Medications Ordered in ED Medications  Chlorhexidine Gluconate Cloth 2 % PADS 6 each (has no administration in time range)  pentafluoroprop-tetrafluoroeth (GEBAUERS) aerosol 1 application (has no administration in time range)  lidocaine (PF) (XYLOCAINE) 1 % injection 5 mL (has no administration in time range)  lidocaine-prilocaine (EMLA) cream 1 application (has no administration in time range)  0.9 %  sodium chloride infusion (has no administration in time range)  0.9 %  sodium chloride infusion (has no administration in time range)  heparin injection 1,000 Units (has no administration in time range)  alteplase (CATHFLO ACTIVASE) injection 2 mg (has no administration in time range)  0.9 %  sodium chloride infusion (has no administration in time range)  acetaminophen (TYLENOL) tablet 650 mg (has no administration in time range)  ondansetron (ZOFRAN) injection 4 mg (has no administration in time range)  heparin injection 5,000 Units (5,000 Units Subcutaneous Given 09/15/20 2202)  fentaNYL (SUBLIMAZE) injection 12.5-25 mcg (has no administration in time range)  lidocaine (LIDODERM) 5 % 1 patch (1 patch Transdermal Patch Applied 09/15/20 2158)  fentaNYL (SUBLIMAZE) injection 50 mcg (50 mcg  Intravenous Given 09/15/20 1532)  iohexol (OMNIPAQUE) 350 MG/ML injection 100 mL (100 mLs Intravenous Contrast Given 09/15/20 1700)    ED Course  I have reviewed the triage vital signs and the nursing notes.  Pertinent labs & imaging results that were available during my care of the patient were reviewed by me and considered in my medical decision making (see chart for details).    MDM Rules/Calculators/A&P                          patient with ESR D on hemodialysis, cirrhosis here for evaluation of right sided chest pain and abdominal pain, the symptoms resulted in him not completing his dialysis session today. He has tenderness on examination without peritoneal findings. CTA was obtained given his history of aortic aneurysm repair. CTA demonstrates large pleural effusion's, no additional abnormalities. Troponins are elevated but flat. BNP is significantly elevated. Given patient's ongoing pain, increased pleural effusion to medicine consulted for admission. Nephrology consulted regarding ESR D and requiring recurrent dialysis. Patient is in agreement with hospitalization.  Final Clinical Impression(s) / ED Diagnoses Final diagnoses:  None    Rx / DC Orders ED Discharge Orders     None        Quintella Reichert, MD 09/15/20 2309

## 2020-09-15 NOTE — ED Triage Notes (Signed)
Pt BIB [facility] from dialysis, c/o R sided CP. Pt was 25 mins into dialysis tx when pain started. States pain worsens with deep breaths. No cardiac hx. Cough x1 week. Prior to EMS arrival, pt had 1 episode emesis. 2 NTG given, last was 1 hr PTA without relief.   EMS VS 140/90, HR 96, SpO2 99% RA, CBG 121

## 2020-09-15 NOTE — ED Notes (Signed)
Patient transported to CT 

## 2020-09-15 NOTE — H&P (Signed)
History and Physical    [name] PPJ:[id number] DOB: [birth year] DOA: 09/15/2020  PCP: [name]., MD   Patient coming from: Home   Chief Complaint: Right chest pain   HPI: Ricky Harris is a 80 y.o. male with medical history significant for ESRD on hemodialysis, liver cirrhosis, hypertension, hypothyroidism, AAA with stent graft, and coronary artery disease seen on CT scan, now presenting to the emergency department from his dialysis center for evaluation of right-sided chest pain.  Patient reports a nonproductive cough for 1 to 2 weeks, has developed some exertional dyspnea over this interval, and then right-sided chest pain in the past 2 to 3 days.  Pain is constant, localized to the right chest, worse with cough or deep breath, and then becoming severe during dialysis which was stopped after 30 minutes so that the patient could be evaluated in the ED.  He denies any fevers, chills, or leg swelling.  He urinates every several days only.  Reports that his weight has been increasing.  ED Course: Upon arrival to the ED, patient is found to be afebrile, saturating mid 90s on room air, mildly tachypneic, and with stable blood pressure.  EKG features sinus rhythm with LAD.  Chest x-ray with left greater than right pleural effusions.  CTA chest/abdomen/pelvis is negative for PE or aortic dissection but notable for ascending thoracic aortic aneurysm and new moderate pleural effusions bilaterally.  Chemistry panel with potassium 3.2.  CBC notable for macrocytic anemia with hemoglobin 10.2 and thrombocytopenia with platelets 90,000.  BNP is >4500 and high-sensitivity troponin 96 and then 87.  Nephrology was consulted by the ED physician, patient was treated with fentanyl, and hospitalist asked to admit.   Review of Systems:  All other systems reviewed and apart from HPI, are negative.  Past Medical History:  Diagnosis Date   AAA (abdominal aortic aneurysm) ([place])    5.3cm , magd by vascular  Dr Scot Dock ,   Anemia    " slightly"   Aortic stenosis    Arthritis    knees   Ascites    Cancer ([place])    prostate   Chronic kidney disease    stage 4, mgd by Dr Justin Mend    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[facility])    Coronary artery disease    on CT scan   Gout    H/O hypercholesterolemia    Heart murmur    History of falling    recent- see ct chest results of 04/25/2018   Hypertension    Hypothyroidism    Liver cirrhosis ([place])    Prediabetes    pt denies    Renal artery stenosis ([facility])    Thrombocytopenia ([place])    Wears glasses    Wears glasses     Past Surgical History:  Procedure Laterality Date   ABDOMINAL AORTIC ENDOVASCULAR STENT GRAFT N/A 01/21/2019   Procedure: ABDOMINAL AORTIC ENDOVASCULAR STENT GRAFT WITH CO2;  Surgeon: Angelia Mould, MD;  Location: [place];  Service: Vascular;  Laterality: N/A;   ABDOMINAL AORTOGRAM W/LOWER EXTREMITY Bilateral 12/14/2018   Procedure: ABDOMINAL AORTOGRAM W/LOWER EXTREMITY;  Surgeon: Angelia Mould, MD;  Location: [place] [facility];  Service: Cardiovascular;  Laterality: Bilateral;   AV FISTULA PLACEMENT Left 03/12/2019   Procedure: LEFT ARTERIOVENOUS Arteriovenous FISTULA CREATION.;  Surgeon: Angelia Mould, MD;  [facility];  Service: Vascular;  Laterality: Left;   CATARACT EXTRACTION W/ INTRAOCULAR LENS  IMPLANT, BILATERAL  CHOLECYSTECTOMY     ESOPHAGEAL BANDING N/A 08/11/2017   Procedure: ESOPHAGEAL BANDING;  Surgeon: Otis Brace, MD;  Location: [place];  Service: Gastroenterology;  Laterality: N/A;   ESOPHAGEAL BANDING N/A 10/26/2017   Procedure: ESOPHAGEAL BANDING;  Surgeon: Otis Brace, MD;  Location: [facility];  Service: Gastroenterology;  Laterality: N/A;   ESOPHAGEAL BANDING N/A 12/19/2017   Procedure: ESOPHAGEAL BANDING;  Surgeon: Otis Brace, MD;  Location: [facility];  Service: Gastroenterology;  Laterality: N/A;   ESOPHAGOGASTRODUODENOSCOPY (EGD) WITH PROPOFOL  N/A 07/05/2017   Procedure: ESOPHAGOGASTRODUODENOSCOPY (EGD) WITH PROPOFOL;  Surgeon: Otis Brace, MD;  Location: [place];  Service: Gastroenterology;  Laterality: N/A;   ESOPHAGOGASTRODUODENOSCOPY (EGD) WITH PROPOFOL N/A 08/11/2017   Procedure: ESOPHAGOGASTRODUODENOSCOPY (EGD) WITH PROPOFOL;  Surgeon: Otis Brace, MD;  Location: [place];  Service: Gastroenterology;  Laterality: N/A;   ESOPHAGOGASTRODUODENOSCOPY (EGD) WITH PROPOFOL N/A 10/26/2017   Procedure: ESOPHAGOGASTRODUODENOSCOPY (EGD) WITH PROPOFOL;  Surgeon: Otis Brace, MD;  [facility];  Service: Gastroenterology;  Laterality: N/A;   ESOPHAGOGASTRODUODENOSCOPY (EGD) WITH PROPOFOL N/A 12/19/2017   Procedure: ESOPHAGOGASTRODUODENOSCOPY (EGD) WITH PROPOFOL;  Surgeon: Otis Brace, MD;  [facility];  Service: Gastroenterology;  Laterality: N/A;   ESOPHAGOGASTRODUODENOSCOPY (EGD) WITH PROPOFOL N/A 10/29/2018   Procedure: ESOPHAGOGASTRODUODENOSCOPY (EGD) WITH PROPOFOL;  Surgeon: Otis Brace, MD;  [facility];  Service: Gastroenterology;  Laterality: N/A;   ESOPHAGOGASTRODUODENOSCOPY (EGD) WITH PROPOFOL N/A 02/14/2020   Procedure: ESOPHAGOGASTRODUODENOSCOPY (EGD) WITH PROPOFOL;  Surgeon: Otis Brace, MD;  [facility];  Service: Gastroenterology;  Laterality: N/A;   EYE SURGERY     bilateral cataract removal with lens placement   HERNIA REPAIR     IR PARACENTESIS  12/26/2018   IR PARACENTESIS  01/02/2019   IR PARACENTESIS  01/15/2019   IR PARACENTESIS  01/30/2019   IR PARACENTESIS  02/05/2019   IR PARACENTESIS  02/14/2019   IR PARACENTESIS  02/28/2019   IR PARACENTESIS  03/14/2019   IR PARACENTESIS  03/29/2019   IR PARACENTESIS  04/11/2019   IR PARACENTESIS  04/25/2019   IR PARACENTESIS  05/09/2019   IR PARACENTESIS  06/03/2019   IR PARACENTESIS  06/06/2019   IR PARACENTESIS  06/20/2019   IR PARACENTESIS  07/03/2019   IR PARACENTESIS  07/19/2019   IR PARACENTESIS   08/08/2019   IR PARACENTESIS  08/27/2019   IR PARACENTESIS  09/09/2019   IR PARACENTESIS  09/24/2019   IR PARACENTESIS  10/07/2019   IR PARACENTESIS  10/23/2019   IR PARACENTESIS  11/07/2019   IR PARACENTESIS  11/21/2019   IR PARACENTESIS  12/04/2019   IR PARACENTESIS  12/25/2019   IR PARACENTESIS  01/10/2020   IR PARACENTESIS  01/27/2020   IR PARACENTESIS  02/19/2020   IR PARACENTESIS  03/27/2020   PROSTATECTOMY     subtotal gastrectomy      Social History:   reports that he has never smoked. He has never used smokeless tobacco. He reports previous alcohol use. He reports that he does not use drugs.  Allergies  Allergen Reactions   Cephalexin Hives   Hydralazine Hives    Family History  Problem Relation Age of Onset   Diabetes Mellitus II Mother    Stroke Mother      Prior to Admission medications   Medication Sig Start Date End Date Taking? Authorizing Provider  calcitRIOL (ROCALTROL) 0.25 MCG capsule Take 1 capsule (0.25 mcg total) by mouth every Tuesday, Thursday, and Saturday at 6 PM. Patient not taking: No sig reported 02/20/20   Rai, Ripudeep  K, MD  calcium acetate (PHOSLO) 667 MG capsule Take 1 capsule (667 mg total) by mouth 3 (three) times daily with meals. 02/20/20   Rai, Vernelle Emerald, MD  carvedilol (COREG) 12.5 MG tablet Take 12.5 mg by mouth 2 (two) times daily. Patient not taking: Reported on 04/14/2020 03/22/17   [provider]  cloNIDine (CATAPRES) 0.1 MG tablet Take 1 tablet (0.1 mg total) by mouth daily. Patient not taking: No sig reported 09/28/19   Autry-Lott, Naaman Plummer, DO  febuxostat (ULORIC) 40 MG tablet Take 1 tablet (40 mg total) by mouth daily. Patient not taking: No sig reported 02/20/20   Rai, Ripudeep K, MD  lactulose (CHRONULAC) 10 GM/15ML solution Take 15 mLs (10 g total) by mouth daily. 02/20/20   Rai, Vernelle Emerald, MD  multivitamin (RENA-VIT) TABS tablet Take 1 tablet by mouth daily. 09/27/19   Autry-Lott, Naaman Plummer, DO  pantoprazole (PROTONIX) 40 MG  tablet Take 1 tablet (40 mg total) by mouth daily. Patient not taking: No sig reported 02/20/20 04/20/20  Rai, Vernelle Emerald, MD  traMADol (ULTRAM) 50 MG tablet Take 1 tablet (50 mg total) by mouth every 8 (eight) hours as needed for moderate pain. 03/12/19   Gabriel Earing, PA-C    Physical Exam: Vitals:   09/15/20 2000 09/15/20 2015 09/15/20 2030 09/15/20 2100  BP: 106/60 (!) 104/57 103/60 111/66  Pulse: 96 98 95 96  Resp: 18 (!) 22 (!) 27 (!) 33  Temp:      TempSrc:      SpO2: 93% 90% 91% 90%  Weight:      Height:        Constitutional: NAD, calm  Eyes: PERTLA, lids and conjunctivae normal ENMT: Mucous membranes are moist. Posterior pharynx clear of any exudate or lesions.   Neck: supple, no masses  Respiratory: Diminished bilaterally, no wheezing. No cyanosis.  Cardiovascular: S1 & S2 heard, regular rate and rhythm. No extremity edema.   Abdomen: No distension, no tenderness, soft. Bowel sounds active.  Musculoskeletal: no clubbing / cyanosis. No joint deformity upper and lower extremities.   Skin: no significant rashes, lesions, ulcers. Warm, dry, well-perfused. Neurologic: CN 2-12 grossly intact. Sensation intact. Moving all extremities.   Psychiatric: Alert and oriented to person, place, and situation. Pleasant and cooperative.    Labs and Imaging on Admission: I have personally reviewed following labs and imaging studies  CBC: Recent Labs  Lab 09/15/20 1526  WBC 6.7  NEUTROABS 5.8  HGB 10.2*  HCT 33.2*  MCV 108.1*  PLT 90*   Basic Metabolic Panel: Recent Labs  Lab 09/15/20 1526  NA 140  K 3.2*  CL 101  CO2 27  GLUCOSE 82  BUN 29*  CREATININE 7.10*  CALCIUM 8.3*   GFR: Estimated Creatinine Clearance: 9.3 mL/min (A) (by C-G formula based on SCr of 7.1 mg/dL (H)). Liver Function Tests: Recent Labs  Lab 09/15/20 1526  AST 25  ALT 14  ALKPHOS 108  BILITOT 1.0  PROT 6.4*  ALBUMIN 3.4*   Recent Labs  Lab 09/15/20 1526  LIPASE 27   No results  for input(s): AMMONIA in the last 168 hours. Coagulation Profile: Recent Labs  Lab 09/15/20 1526  INR 1.2   Cardiac Enzymes: No results for input(s): CKTOTAL, CKMB, CKMBINDEX, TROPONINI in the last 168 hours. BNP (last 3 results) No results for input(s): PROBNP in the last 8760 hours. HbA1C: No results for input(s): HGBA1C in the last 72 hours. CBG: No results for input(s): GLUCAP in the last  168 hours. Lipid Profile: No results for input(s): CHOL, HDL, LDLCALC, TRIG, CHOLHDL, LDLDIRECT in the last 72 hours. Thyroid Function Tests: No results for input(s): TSH, T4TOTAL, FREET4, T3FREE, THYROIDAB in the last 72 hours. Anemia Panel: No results for input(s): VITAMINB12, FOLATE, FERRITIN, TIBC, IRON, RETICCTPCT in the last 72 hours. Urine analysis:    Component Value Date/Time   COLORURINE YELLOW 01/18/2019 1230   APPEARANCEUR CLEAR 01/18/2019 1230   LABSPEC 1.017 01/18/2019 1230   PHURINE 5.0 01/18/2019 1230   GLUCOSEU NEGATIVE 01/18/2019 1230   HGBUR NEGATIVE 01/18/2019 1230   BILIRUBINUR NEGATIVE 01/18/2019 1230   KETONESUR NEGATIVE 01/18/2019 1230   PROTEINUR 100 (A) 01/18/2019 1230   NITRITE NEGATIVE 01/18/2019 1230   LEUKOCYTESUR NEGATIVE 01/18/2019 1230   Sepsis Labs: @LABRCNTIP (procalcitonin:4,lacticidven:4) ) Recent Results (from the past 240 hour(s))  Resp Panel by RT-PCR (Flu A&B, Covid) Nasopharyngeal Swab     Status: None   Collection Time: 09/15/20  4:14 PM   Specimen: Nasopharyngeal Swab; Nasopharyngeal(NP) swabs in vial transport medium  Result Value Ref Range Status   SARS Coronavirus 2 by RT PCR NEGATIVE NEGATIVE Final    Comment: (NOTE) SARS-CoV-2 target nucleic acids are NOT DETECTED.  The SARS-CoV-2 RNA is generally detectable in upper respiratory specimens during the acute phase of infection. The lowest concentration of SARS-CoV-2 viral copies this assay can detect is 138 copies/mL. A negative result does not preclude SARS-Cov-2 infection and  should not be used as the sole basis for treatment or other patient management decisions. A negative result may occur with  improper specimen collection/handling, submission of specimen other than nasopharyngeal swab, presence of viral mutation(s) within the areas targeted by this assay, and inadequate number of viral copies(<138 copies/mL). A negative result must be combined with clinical observations, patient history, and epidemiological information. The expected result is Negative.  Fact Sheet for Patients:  EntrepreneurPulse.com.au  Fact Sheet for Healthcare Providers:  IncredibleEmployment.be  This test is no t yet approved or cleared by the [place] FDA and  has been authorized for detection and/or diagnosis of SARS-CoV-2 by FDA under an Emergency Use Authorization (EUA). This EUA will remain  in effect (meaning this test can be used) for the duration of the COVID-19 declaration under Section 564(b)(1) of the Act, 21 U.S.C.section 360bbb-3(b)(1), unless the authorization is terminated  or revoked sooner.       Influenza A by PCR NEGATIVE NEGATIVE Final   Influenza B by PCR NEGATIVE NEGATIVE Final    Comment: (NOTE) The Xpert Xpress SARS-CoV-2/FLU/RSV plus assay is intended as an aid in the diagnosis of influenza from Nasopharyngeal swab specimens and should not be used as a sole basis for treatment. Nasal washings and aspirates are unacceptable for Xpert Xpress SARS-CoV-2/FLU/RSV testing.  Fact Sheet for Patients: EntrepreneurPulse.com.au  Fact Sheet for Healthcare Providers: IncredibleEmployment.be  This test is not yet approved or cleared by the [place] FDA and has been authorized for detection and/or diagnosis of SARS-CoV-2 by FDA under an Emergency Use Authorization (EUA). This EUA will remain in effect (meaning this test can be used) for the duration of the COVID-19 declaration  under Section 564(b)(1) of the Act, 21 U.S.C. section 360bbb-3(b)(1), unless the authorization is terminated or revoked.  Performed at [facility], [place] [address] [place], [place] [address]      Radiological Exams on Admission: DG Chest Port 1 View  Result Date: 09/15/2020 CLINICAL DATA:  Onset right chest pain during dialysis today. EXAM: PORTABLE CHEST 1 VIEW  COMPARISON:  PA and lateral chest 04/14/2020. FINDINGS: There is bilateral airspace disease and left greater than right pleural effusions. Cardiomegaly. Aortic atherosclerosis. No pneumothorax. No acute or focal bony abnormality. IMPRESSION: Left greater than right pleural effusions and airspace disease have an appearance most compatible with congestive failure but could reflect pneumonia. Cardiomegaly. Aortic Atherosclerosis (ICD10-I70.0). Electronically Signed   By: Inge Rise M.D.   On: 09/15/2020 16:03   CT Angio Chest/Abd/Pel for Dissection W and/or W/WO  Result Date: 09/15/2020 CLINICAL DATA:  Abdominal pain, suspected aortic dissection EXAM: CT ANGIOGRAPHY CHEST, ABDOMEN AND PELVIS TECHNIQUE: Multidetector CT imaging through the chest, abdomen and pelvis was performed using the standard protocol during bolus administration of intravenous contrast. Multiplanar reconstructed images and MIPs were obtained and reviewed to evaluate the vascular anatomy. CONTRAST:  146mL OMNIPAQUE IOHEXOL 350 MG/ML SOLN COMPARISON:  CT 04/14/2020 and previous FINDINGS: CTA CHEST FINDINGS Cardiovascular: 4-chamber mild cardiomegaly has developed. No pericardial effusion. Satisfactory opacification of pulmonary arteries noted, and there is no evidence of pulmonary emboli. Coarse coronary calcifications. Good contrast opacification of the thoracic aorta, without dissection or aneurysm. Moderate calcified atheromatous plaque in the arch and descending thoracic segment. 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 Aortic Root:  --Valve: 2.7 cm with coarse calcifications on the leaflets --Sinuses: 3.6 cm --Sinotubular Junction: 3.1 cm Limitations by motion: Moderate Thoracic Aorta: --Ascending Aorta: 4.1   cm --Aortic Arch: 4 cm --Descending Aorta: 3.3 cm Mediastinum/Nodes: No mass or adenopathy. Lungs/Pleura: Moderate pleural effusions have developed. There are extensive opacities throughout the left lower lobe, and dependent atelectasis/consolidation in the right lower lobe. No pneumothorax. Musculoskeletal: Anterior vertebral endplate spurring at multiple levels in the lower thoracic spine. Left shoulder DJD. 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 Review of the MIP images confirms the above findings. CTA ABDOMEN AND PELVIS FINDINGS VASCULAR Aorta: Patent infrarenal bifurcated aortic stent graft as before, with mild narrowing of the left limb, stable. Native sac diameter 6.1 x 6 cm maximum transverse dimensions (previously 5.9 by my measurement). Limited assessment for endoleak on this single phase study. Celiac: Patent.  Splenic artery diffusely calcified. SMA: Patent without evidence of aneurysm, dissection, vasculitis or significant stenosis. Renals: Single bilaterally, both with heavily calcified ostial plaque resulting in short segment stenosis of at least moderate severity, patent distally. IMA: Proximal occlusion, reconstituted distally by visceral collaterals. Inflow: Right limb of the stent graft extends to the distal common iliac, well apposed. Left limb extends to distal common iliac, well apposed. Calcified plaque in the native internal iliac arteries without aneurysm. Native external iliac arteries are mildly tortuous, patent. Veins: No obvious venous abnormality within the limitations of this arterial phase study. Review of the MIP images confirms the above findings. NON-VASCULAR Hepatobiliary: No focal liver abnormality is seen. Status post cholecystectomy. No biliary dilatation.  Pancreas: Unremarkable. No pancreatic ductal dilatation or surrounding inflammatory changes. Spleen: Normal in size without focal abnormality. Adrenals/Urinary Tract: Adrenal glands unremarkable. Bilateral renal parenchymal atrophy. No hydronephrosis. 2.5 cm exophytic probable cyst from the mid right kidney, stable. Urinary bladder is incompletely distended. Stomach/Bowel: Stomach is partially distended, unremarkable. The small bowel is decompressed. Normal appendix. Innumerable descending and sigmoid diverticula. Lymphatic: No abdominal or pelvic adenopathy. Reproductive: Penile pump components stable position. Previous prostatectomy. Other: Small volume abdominal ascites, new since previous. No free air. Musculoskeletal: Spondylitic changes in the lower lumbar spine. Left hip DJD. No displaced fracture or worrisome bone lesion. Review of the MIP images confirms the  above findings. IMPRESSION: 1. Negative for acute PE or thoracic aortic dissection. 2. 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 Recommend annual imaging followup by CTA or MRA. This recommendation follows 2010 ACCF/AHA/AATS/ACR/ASA/SCA/SCAI/SIR/STS/SVM Guidelines for the Diagnosis and Management of Patients with Thoracic Aortic Disease. Circulation. 2010; 121: P591-M384. 3. New moderate pleural effusions with bilateral pulmonary opacities. 4. Small volume  Abdominal ascites. 5. Patent aortic stent graft with 6.1 cm native sac diameter (previously 5.9). 6. Descending and sigmoid diverticulosis Electronically Signed   By: Lucrezia Europe M.D.   On: 09/15/2020 17:32    EKG: Independently reviewed. Sinus rhythm, LAD.   Assessment/Plan   1. Bilateral pleural effusions; acute on chronic diastolic CHF   - Presents with 1-2 wks of non-productive cough and DOE, and 2-3 days of pleuritic chest pain on the right and is found to have new bilateral pleural effusion  - BNP >4500 in ED, he reports recent wt gain, had EF 60-65% on yr ago, has cirrhosis and ESRD  and only urinates every several days  - Appreciate nephrology involvement, planning for inpatient HD  - Restrict fluids, update echo, consider thoracentesis is effusions do not respond to volume removal    2. ESRD  - Left HD after 30 minutes today to come to ED for evaluation of right-sided chest pain  - He appears hypervolemic on admission but without hyperkalemia, uremia, acidosis, or HTN  - Nephrology consulted by ED physician and much appreciated, planning for HD in am  - Restrict fluid, renally-dose medications    3. Cirrhosis  - Appears compensated   4. Chest pain   - Presents with 2-3 days of constant right chest pain that is reproducible and worse with cough  - CTA negative for PE but notable for pleural effusions  - Pain is musculoskeletal, likely related to coughing  - Continue pain-control    5. Elevated troponin  - HS troponin was 96 then 87 in ED  - No angina or acute ischemic features on EKG and there is low-suspicion for ACS    6. AAA; thoracic aortic aneurysm  - CTA in ED negative for dissection but notable for 4.1 cm ascending thoracic aortic aneurysm and patent aortic stent graft with 6.1 cm native sac diameter up from 5.9 cm  - Discussed with patient, outpatient follow-up recommended    7. Thrombocytopenia  - Platelets 90k in ED, appears chronic, no apparent bleeding or infection, likely related to chronic liver disease    DVT prophylaxis: sq heparin  Code Status: Full, confirmed with patient on admission  Level of Care: Level of care: Telemetry Cardiac Family Communication: None present  Disposition Plan:  Patient is from: Home  Anticipated d/c is to: Home  Anticipated d/c date is: 7/21 or 09/18/20  Patient currently: Pending echocardiogram, inpatient HD  Consults called: Nephrology consulted by ED physician  Admission status: Inpatient     Vianne Bulls, MD [facility]  09/15/2020, 9:16 PM

## 2020-09-16 ENCOUNTER — Inpatient Hospital Stay (HOSPITAL_COMMUNITY): Payer: Medicare Other

## 2020-09-16 ENCOUNTER — Other Ambulatory Visit (HOSPITAL_COMMUNITY): Payer: Medicare Other

## 2020-09-16 DIAGNOSIS — I5031 Acute diastolic (congestive) heart failure: Secondary | ICD-10-CM | POA: Diagnosis not present

## 2020-09-16 LAB — ECHOCARDIOGRAM COMPLETE
Area-P 1/2: 4.96 cm2
Calc EF: 32.9 %
Height: 72 in
S' Lateral: 4.4 cm
Single Plane A2C EF: 30.5 %
Single Plane A4C EF: 31.2 %
Weight: 2716.07 oz

## 2020-09-16 LAB — BASIC METABOLIC PANEL
Anion gap: 11 (ref 5–15)
BUN: 34 mg/dL — ABNORMAL HIGH (ref 8–23)
CO2: 25 mmol/L (ref 22–32)
Calcium: 8 mg/dL — ABNORMAL LOW (ref 8.9–10.3)
Chloride: 99 mmol/L (ref 98–111)
Creatinine, Ser: 7.93 mg/dL — ABNORMAL HIGH (ref 0.61–1.24)
GFR, Estimated: 6 mL/min — ABNORMAL LOW (ref >60)
Glucose, Bld: 81 mg/dL (ref 70–99)
Potassium: 3.7 mmol/L (ref 3.5–5.1)
Sodium: 135 mmol/L (ref 135–145)

## 2020-09-16 LAB — CBC
HCT: 30.9 % — ABNORMAL LOW (ref 39.0–52.0)
Hemoglobin: 9.8 g/dL — ABNORMAL LOW (ref 13.0–17.0)
MCH: 34 pg (ref 26.0–34.0)
MCHC: 31.7 g/dL (ref 30.0–36.0)
MCV: 107.3 fL — ABNORMAL HIGH (ref 80.0–100.0)
Platelets: 87 10*3/uL — ABNORMAL LOW (ref 150–400)
RBC: 2.88 MIL/uL — ABNORMAL LOW (ref 4.22–5.81)
RDW: 16.8 % — ABNORMAL HIGH (ref 11.5–15.5)
WBC: 7.3 10*3/uL (ref 4.0–10.5)
nRBC: 0 % (ref 0.0–0.2)

## 2020-09-16 LAB — PROCALCITONIN: Procalcitonin: 0.6 ng/mL

## 2020-09-16 MED ORDER — DEXTROSE 5 % IV SOLN: 250.0000 mg | INTRAVENOUS | Status: DC

## 2020-09-16 MED ORDER — CEFTRIAXONE SODIUM 1 G IJ SOLR: 1.0000 g | INTRAMUSCULAR | Status: DC

## 2020-09-16 MED ORDER — SODIUM CHLORIDE 0.9 % IV SOLN
1.5000 g | INTRAVENOUS | Status: AC
Start: 2020-09-16 — End: 2020-09-21
  Administered 2020-09-16 – 2020-09-20 (×4): 1.5 g via INTRAVENOUS
  Filled 2020-09-16 (×4): qty 4
  Filled 2020-09-16: qty 1.5
  Filled 2020-09-16: qty 4

## 2020-09-16 NOTE — Consult Note (Addendum)
[facility] Renal Consultation Note    Indication for Consultation:  Management of ESRD/hemodialysis, anemia, hypertension/volume, and secondary hyperparathyroidism. PCP:  HPI: Ricky Harris is a 80 y.o. male with ESRD, HTN, Hx AAA, cirrhosis, hypothyroidism who was admitted with chets pain, found to have B pleural effusions.  Brought to ED yesterday from dialysis after developing severe chest pain, diaphoresis, dyspnea - took NTG x 2 without relief. Had been having R sided chest pain and non-productive cough for 1-2 weeks PTA. Denied fever, chills, N/V/D. + mild abdominal pain. In ED - vitals were normal and he was afebrile. EKG without acute ST changes. Labs with Na 137, K 3.4 (post-HD), Ca 8, Alb 2.8, BNP > 4500, WBC 8.2, Hgb 9.8, Trop 87. COVID/Flu negative. 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 Given Ceftrixone and azithro for possible pneumonia. He was admitted and dialysis ordered for this morning.  Seen on HD this morning - 2L UFG and tolerating. Denies CP or dyspnea at the moment.  Dialyzes on TTS schedule at [facility], s/p partial HD yesterday. Uses LUE AVF as his access - no recent issues.  Past Medical History:  Diagnosis Date   AAA (abdominal aortic aneurysm) ([place])    5.3cm , magd by vascular Dr Scot Dock ,   Anemia    " slightly"   Aortic stenosis    Arthritis    knees   Ascites    Cancer ([place])    prostate   Chronic kidney disease    stage 4, mgd by Dr Justin Mend    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[facility])    Coronary artery disease    on CT scan   Gout    H/O hypercholesterolemia    Heart murmur    History of falling    recent- see ct chest results of 04/25/2018   Hypertension    Hypothyroidism    Liver cirrhosis ([place])    Prediabetes    pt denies    Renal artery stenosis ([facility])    Thrombocytopenia ([place])    Wears glasses    Wears glasses    Past  Surgical History:  Procedure Laterality Date   ABDOMINAL AORTIC ENDOVASCULAR STENT GRAFT N/A 01/21/2019   Procedure: ABDOMINAL AORTIC ENDOVASCULAR STENT GRAFT WITH CO2;  Surgeon: Angelia Mould, MD;  Location: [place];  Service: Vascular;  Laterality: N/A;   ABDOMINAL AORTOGRAM W/LOWER EXTREMITY Bilateral 12/14/2018   Procedure: ABDOMINAL AORTOGRAM W/LOWER EXTREMITY;  Surgeon: Angelia Mould, MD;  Location: [place] CV LAB;  Service: Cardiovascular;  Laterality: Bilateral;   AV FISTULA PLACEMENT Left 03/12/2019   Procedure: LEFT ARTERIOVENOUS Arteriovenous FISTULA CREATION.;  Surgeon: Angelia Mould, MD;  Location[facility] OR;  Service: Vascular;  Laterality: Left;   CATARACT EXTRACTION W/ INTRAOCULAR LENS  IMPLANT, BILATERAL     CHOLECYSTECTOMY     ESOPHAGEAL BANDING N/A 08/11/2017   Procedure: ESOPHAGEAL BANDING;  Surgeon: Otis Brace, MD;  Location: [place];  Service: Gastroenterology;  Laterality: N/A;   ESOPHAGEAL BANDING N/A 10/26/2017   Procedure: ESOPHAGEAL BANDING;  Surgeon: Otis Brace, MD;  Location: [facility];  Service: Gastroenterology;  Laterality: N/A;   ESOPHAGEAL BANDING N/A 12/19/2017   Procedure: ESOPHAGEAL BANDING;  Surgeon: Otis Brace, MD;  Location: [facility];  Service: Gastroenterology;  Laterality: N/A;   ESOPHAGOGASTRODUODENOSCOPY (EGD) WITH PROPOFOL N/A 07/05/2017   Procedure: ESOPHAGOGASTRODUODENOSCOPY (EGD) WITH PROPOFOL;  Surgeon: Otis Brace,  MD;  Location: [place];  Service: Gastroenterology;  Laterality: N/A;   ESOPHAGOGASTRODUODENOSCOPY (EGD) WITH PROPOFOL N/A 08/11/2017   Procedure: ESOPHAGOGASTRODUODENOSCOPY (EGD) WITH PROPOFOL;  Surgeon: Otis Brace, MD;  Location: [place];  Service: Gastroenterology;  Laterality: N/A;   ESOPHAGOGASTRODUODENOSCOPY (EGD) WITH PROPOFOL N/A 10/26/2017   Procedure: ESOPHAGOGASTRODUODENOSCOPY (EGD) WITH PROPOFOL;  Surgeon: Otis Brace, MD;  [facility];  Service: Gastroenterology;  Laterality: N/A;   ESOPHAGOGASTRODUODENOSCOPY (EGD) WITH PROPOFOL N/A 12/19/2017   Procedure: ESOPHAGOGASTRODUODENOSCOPY (EGD) WITH PROPOFOL;  Surgeon: Otis Brace, MD;  Location: [facility];  Service: Gastroenterology;  Laterality: N/A;   ESOPHAGOGASTRODUODENOSCOPY (EGD) WITH PROPOFOL N/A 10/29/2018   Procedure: ESOPHAGOGASTRODUODENOSCOPY (EGD) WITH PROPOFOL;  Surgeon: Otis Brace, MD;  Location: [facility];  Service: Gastroenterology;  Laterality: N/A;   ESOPHAGOGASTRODUODENOSCOPY (EGD) WITH PROPOFOL N/A 02/14/2020   Procedure: ESOPHAGOGASTRODUODENOSCOPY (EGD) WITH PROPOFOL;  Surgeon: Otis Brace, MD;  Location: [facility];  Service: Gastroenterology;  Laterality: N/A;   EYE SURGERY     bilateral cataract removal with lens placement   HERNIA REPAIR     IR PARACENTESIS  12/26/2018   IR PARACENTESIS  01/02/2019   IR PARACENTESIS  01/15/2019   IR PARACENTESIS  01/30/2019   IR PARACENTESIS  02/05/2019   IR PARACENTESIS  02/14/2019   IR PARACENTESIS  02/28/2019   IR PARACENTESIS  03/14/2019   IR PARACENTESIS  03/29/2019   IR PARACENTESIS  04/11/2019   IR PARACENTESIS  04/25/2019   IR PARACENTESIS  05/09/2019   IR PARACENTESIS  06/03/2019   IR PARACENTESIS  06/06/2019   IR PARACENTESIS  06/20/2019   IR PARACENTESIS  07/03/2019   IR PARACENTESIS  07/19/2019   IR PARACENTESIS  08/08/2019   IR PARACENTESIS  08/27/2019   IR PARACENTESIS  09/09/2019   IR PARACENTESIS  09/24/2019   IR PARACENTESIS  10/07/2019   IR PARACENTESIS  10/23/2019   IR PARACENTESIS  11/07/2019   IR PARACENTESIS  11/21/2019   IR PARACENTESIS  12/04/2019   IR PARACENTESIS  12/25/2019   IR PARACENTESIS  01/10/2020   IR PARACENTESIS  01/27/2020   IR PARACENTESIS  02/19/2020   IR PARACENTESIS  03/27/2020   PROSTATECTOMY     subtotal gastrectomy     Family History  Problem Relation Age of Onset   Diabetes Mellitus II Mother    Stroke Mother    Social History:  reports that  he has never smoked. He has never used smokeless tobacco. He reports previous alcohol use. He reports that he does not use drugs.  ROS: As per HPI otherwise negative.  Physical Exam: Vitals:   09/16/20 0200 09/16/20 0200 09/16/20 0400 09/16/20 0600  BP: 102/64  100/61 104/60  Pulse: 87  89 89  Resp: 20  (!) 22 (!) 22  Temp:  98.4 F (36.9 C)    TempSrc:  Oral    SpO2: 94%  97% 93%  Weight:      Height:         General: Well developed, well nourished, in no acute distress. Room air. Head: Normocephalic, atraumatic, sclera non-icteric, mucus membranes are moist. Neck: Supple without lymphadenopathy/masses. JVD not elevated. Lungs: Clear bilaterally to auscultation without wheezes, rales, or rhonchi. Breathing is unlabored. Heart: RRR with normal S1, S2. No murmurs, rubs, or gallops appreciated. Abdomen: Soft, mild generalized tenderness without guarding. Non-distended. Musculoskeletal:  Strength and tone appear normal for age. Lower extremities: Trace BLE edema, no ischemic changes, no open wounds. Neuro: Alert and oriented X 3. Moves all extremities spontaneously.  Psych:  Responds to questions appropriately with a normal affect. Dialysis Access: LUE AVF + bruit  Allergies  Allergen Reactions   Cephalexin Hives   Hydralazine Hives   Prior to Admission medications   Medication Sig Start Date End Date Taking? Authorizing Provider  amLODipine (NORVASC) 5 MG tablet Take 5 mg by mouth daily. 07/29/20  Yes [provider]  atorvastatin (LIPITOR) 10 MG tablet Take 10 mg by mouth daily. 07/16/20  Yes [provider]  calcium acetate (PHOSLO) 667 MG capsule Take 1 capsule (667 mg total) by mouth 3 (three) times daily with meals. 02/20/20  Yes Rai, Ripudeep K, MD  carvedilol (COREG) 12.5 MG tablet Take 12.5 mg by mouth 2 (two) times daily. 03/22/17  Yes [provider]  febuxostat (ULORIC) 40 MG tablet Take 1 tablet (40 mg total) by mouth daily. 02/20/20  Yes Rai,  Ripudeep K, MD  traMADol (ULTRAM) 50 MG tablet Take 1 tablet (50 mg total) by mouth every 8 (eight) hours as needed for moderate pain. 03/12/19  Yes Rhyne, Samantha J, PA-C  calcitRIOL (ROCALTROL) 0.25 MCG capsule Take 1 capsule (0.25 mcg total) by mouth every Tuesday, Thursday, and Saturday at 6 PM. Patient not taking: No sig reported 02/20/20   Rai, Ripudeep K, MD  cloNIDine (CATAPRES) 0.1 MG tablet Take 1 tablet (0.1 mg total) by mouth daily. Patient not taking: No sig reported 09/28/19   Autry-Lott, Naaman Plummer, DO  lactulose (CHRONULAC) 10 GM/15ML solution Take 15 mLs (10 g total) by mouth daily. 02/20/20   Rai, Vernelle Emerald, MD  multivitamin (RENA-VIT) TABS tablet Take 1 tablet by mouth daily. 09/27/19   Autry-Lott, Naaman Plummer, DO  pantoprazole (PROTONIX) 40 MG tablet Take 1 tablet (40 mg total) by mouth daily. Patient not taking: No sig reported 02/20/20 04/20/20  Mendel Corning, MD   Current Facility-Administered Medications  Medication Dose Route Frequency Provider Last Rate Last Admin   0.9 %  sodium chloride infusion  100 mL Intravenous PRN Opyd, Ilene Qua, MD       0.9 %  sodium chloride infusion  100 mL Intravenous PRN Opyd, Ilene Qua, MD       0.9 %  sodium chloride infusion  250 mL Intravenous PRN Opyd, Ilene Qua, MD       acetaminophen (TYLENOL) tablet 650 mg  650 mg Oral Q4H PRN Opyd, Ilene Qua, MD       alteplase (CATHFLO ACTIVASE) injection 2 mg  2 mg Intracatheter Once PRN Opyd, Ilene Qua, MD       azithromycin (ZITHROMAX) 250 mg in dextrose 5 % 125 mL IVPB  250 mg Intravenous Q24H Pahwani, Ravi, MD       cefTRIAXone (ROCEPHIN) 1 g in sodium chloride 0.9 % 100 mL IVPB  1 g Intravenous Q24H Pahwani, Ravi, MD       Chlorhexidine Gluconate Cloth 2 % PADS 6 each  6 each Topical Q0600 Opyd, Ilene Qua, MD       fentaNYL (SUBLIMAZE) injection 12.5-25 mcg  12.5-25 mcg Intravenous Q2H PRN Opyd, Ilene Qua, MD       heparin injection 1,000 Units  1,000 Units Dialysis PRN Opyd, Ilene Qua, MD        heparin injection 5,000 Units  5,000 Units Subcutaneous Q8H Opyd, Ilene Qua, MD   5,000 Units at 09/15/20 2202   lidocaine (LIDODERM) 5 % 1 patch  1 patch Transdermal Q24H Opyd, Ilene Qua, MD   1 patch at 09/15/20 2158   lidocaine (PF) (XYLOCAINE)  1 % injection 5 mL  5 mL Intradermal PRN Opyd, Ilene Qua, MD       lidocaine-prilocaine (EMLA) cream 1 application  1 application Topical PRN Opyd, Ilene Qua, MD       ondansetron (ZOFRAN) injection 4 mg  4 mg Intravenous Q6H PRN Opyd, Ilene Qua, MD       pentafluoroprop-tetrafluoroeth (GEBAUERS) aerosol 1 application  1 application Topical PRN Opyd, Ilene Qua, MD       Current Outpatient Medications  Medication Sig Dispense Refill   amLODipine (NORVASC) 5 MG tablet Take 5 mg by mouth daily.     atorvastatin (LIPITOR) 10 MG tablet Take 10 mg by mouth daily.     calcium acetate (PHOSLO) 667 MG capsule Take 1 capsule (667 mg total) by mouth 3 (three) times daily with meals. 90 capsule 2   carvedilol (COREG) 12.5 MG tablet Take 12.5 mg by mouth 2 (two) times daily.     febuxostat (ULORIC) 40 MG tablet Take 1 tablet (40 mg total) by mouth daily. 30 tablet 3   traMADol (ULTRAM) 50 MG tablet Take 1 tablet (50 mg total) by mouth every 8 (eight) hours as needed for moderate pain. 8 tablet 0   calcitRIOL (ROCALTROL) 0.25 MCG capsule Take 1 capsule (0.25 mcg total) by mouth every Tuesday, Thursday, and Saturday at 6 PM. (Patient not taking: No sig reported) 30 capsule 3   cloNIDine (CATAPRES) 0.1 MG tablet Take 1 tablet (0.1 mg total) by mouth daily. (Patient not taking: No sig reported) 30 tablet 0   lactulose (CHRONULAC) 10 GM/15ML solution Take 15 mLs (10 g total) by mouth daily. 236 mL 1   multivitamin (RENA-VIT) TABS tablet Take 1 tablet by mouth daily. 30 tablet 0   pantoprazole (PROTONIX) 40 MG tablet Take 1 tablet (40 mg total) by mouth daily. (Patient not taking: No sig reported) 30 tablet 1   Labs: Basic Metabolic Panel: Recent Labs  Lab  09/15/20 1526 09/15/20 2025 09/16/20 0335  NA 140 137 135  K 3.2* 3.4* 3.7  CL 101 99 99  CO2 27 24 25   GLUCOSE 82 90 81  BUN 29* 31* 34*  CREATININE 7.10* 7.61* 7.93*  CALCIUM 8.3* 8.0* 8.0*  PHOS  --  4.2  --    Liver Function Tests: Recent Labs  Lab 09/15/20 1526 09/15/20 2025  AST 25  --   ALT 14  --   ALKPHOS 108  --   BILITOT 1.0  --   PROT 6.4*  --   ALBUMIN 3.4* 2.8*   Recent Labs  Lab 09/15/20 1526  LIPASE 27   CBC: Recent Labs  Lab 09/15/20 1526 09/15/20 2025 09/16/20 0335  WBC 6.7 8.2 7.3  NEUTROABS 5.8  --   --   HGB 10.2* 9.8* 9.8*  HCT 33.2* 30.4* 30.9*  MCV 108.1* 105.6* 107.3*  PLT 90* 91* 87*   Studies/Results: DG Chest Port 1 View  Result Date: 09/15/2020 CLINICAL DATA:  Onset right chest pain during dialysis today. EXAM: PORTABLE CHEST 1 VIEW COMPARISON:  PA and lateral chest 04/14/2020. FINDINGS: There is bilateral airspace disease and left greater than right pleural effusions. Cardiomegaly. Aortic atherosclerosis. No pneumothorax. No acute or focal bony abnormality. IMPRESSION: Left greater than right pleural effusions and airspace disease have an appearance most compatible with congestive failure but could reflect pneumonia. Cardiomegaly. Aortic Atherosclerosis (ICD10-I70.0). Electronically Signed   By: Inge Rise M.D.   On: 09/15/2020 16:03   CT Angio Chest/Abd/Pel for Dissection  W and/or W/WO  Result Date: 09/15/2020 CLINICAL DATA:  Abdominal pain, suspected aortic dissection EXAM: CT ANGIOGRAPHY CHEST, ABDOMEN AND PELVIS TECHNIQUE: Multidetector CT imaging through the chest, abdomen and pelvis was performed using the standard protocol during bolus administration of intravenous contrast. Multiplanar reconstructed images and MIPs were obtained and reviewed to evaluate the vascular anatomy. CONTRAST:  133mL OMNIPAQUE IOHEXOL 350 MG/ML SOLN COMPARISON:  CT 04/14/2020 and previous FINDINGS: CTA CHEST FINDINGS Cardiovascular: 4-chamber mild  cardiomegaly has developed. No pericardial effusion. Satisfactory opacification of pulmonary arteries noted, and there is no evidence of pulmonary emboli. Coarse coronary calcifications. Good contrast opacification of the thoracic aorta, without dissection or aneurysm. Moderate calcified atheromatous plaque in the arch and descending thoracic segment. 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 Aortic Root: --Valve: 2.7 cm with coarse calcifications on the leaflets --Sinuses: 3.6 cm --Sinotubular Junction: 3.1 cm Limitations by motion: Moderate Thoracic Aorta: --Ascending Aorta: 4.1   cm --Aortic Arch: 4 cm --Descending Aorta: 3.3 cm Mediastinum/Nodes: No mass or adenopathy. Lungs/Pleura: Moderate pleural effusions have developed. There are extensive opacities throughout the left lower lobe, and dependent atelectasis/consolidation in the right lower lobe. No pneumothorax. Musculoskeletal: Anterior vertebral endplate spurring at multiple levels in the lower thoracic spine. Left shoulder DJD. 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 Review of the MIP images confirms the above findings. CTA ABDOMEN AND PELVIS FINDINGS VASCULAR Aorta: Patent infrarenal bifurcated aortic stent graft as before, with mild narrowing of the left limb, stable. Native sac diameter 6.1 x 6 cm maximum transverse dimensions (previously 5.9 by my measurement). Limited assessment for endoleak on this single phase study. Celiac: Patent.  Splenic artery diffusely calcified. SMA: Patent without evidence of aneurysm, dissection, vasculitis or significant stenosis. Renals: Single bilaterally, both with heavily calcified ostial plaque resulting in short segment stenosis of at least moderate severity, patent distally. IMA: Proximal occlusion, reconstituted distally by visceral collaterals. Inflow: Right limb of the stent graft extends to the distal common iliac, well apposed. Left limb extends  to distal common iliac, well apposed. Calcified plaque in the native internal iliac arteries without aneurysm. Native external iliac arteries are mildly tortuous, patent. Veins: No obvious venous abnormality within the limitations of this arterial phase study. Review of the MIP images confirms the above findings. NON-VASCULAR Hepatobiliary: No focal liver abnormality is seen. Status post cholecystectomy. No biliary dilatation. Pancreas: Unremarkable. No pancreatic ductal dilatation or surrounding inflammatory changes. Spleen: Normal in size without focal abnormality. Adrenals/Urinary Tract: Adrenal glands unremarkable. Bilateral renal parenchymal atrophy. No hydronephrosis. 2.5 cm exophytic probable cyst from the mid right kidney, stable. Urinary bladder is incompletely distended. Stomach/Bowel: Stomach is partially distended, unremarkable. The small bowel is decompressed. Normal appendix. Innumerable descending and sigmoid diverticula. Lymphatic: No abdominal or pelvic adenopathy. Reproductive: Penile pump components stable position. Previous prostatectomy. Other: Small volume abdominal ascites, new since previous. No free air. Musculoskeletal: Spondylitic changes in the lower lumbar spine. Left hip DJD. No displaced fracture or worrisome bone lesion. Review of the MIP images confirms the above findings. IMPRESSION: 1. Negative for acute PE or thoracic aortic dissection. 2. 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 Recommend annual imaging followup by CTA or MRA. This recommendation follows 2010 ACCF/AHA/AATS/ACR/ASA/SCA/SCAI/SIR/STS/SVM Guidelines for the Diagnosis and Management of Patients with Thoracic Aortic Disease. Circulation. 2010; 121: X517-G017. 3. New moderate pleural effusions with bilateral pulmonary opacities. 4. Small volume  Abdominal ascites. 5. Patent aortic stent graft with 6.1 cm native sac diameter (previously 5.9). 6. Descending  and sigmoid diverticulosis Electronically Signed   By: Lucrezia Europe M.D.   On: 09/15/2020 17:32    Dialysis Orders:  TTS [place] 4hr, 350/500, EDW 77.5kg, 3K/2.5Ca, AVF, heparin 3000 bolus - Venofer 50mg  IV q week - Calcitriol 0.83mcg PO q HD  Assessment/Plan:  Chest pain/B pleural effusions: CTA neg for PE/dissection. Extra HD today for volume reduction. Looks like being covered for pneumonia as well.  ESRD:  Usual TTS schedule - HD today, then back to usual HD tomorrow. 3K bath.  Hypertension/volume: BP low/normal, on Coreg at home - held here for now.  Anemia: Hgb 9.8 - follow without ESA for now.  Metabolic bone disease: Ca/Phos ok - continue home binders/VDRA.  cirrhosis  Elevated Trop: Follow trends  AAA/new ascending thoracic AA: For OP follow-up per notes.  Thrombocytopenia  Veneta Penton, PA-C 09/16/2020, 8:35 AM  [facility] I have seen and examined this patient and agree with plan and assessment in the above note with renal recommendations/intervention highlighted.  Spiking a fever will check blood cultures on HD.  Broadus John A Nikodem Leadbetter,MD 09/16/2020 9:51 AM

## 2020-09-16 NOTE — Progress Notes (Signed)
NEW ADMISSION NOTE New Admission Note:   Arrival Method: Patient arrived from ED on stretcher. Mental Orientation: alert and oriented x 4. Telemetry: [id number]-16 Assessment: In process Skin: WNL IV: R AC SL. Pain: 0/10 Tubes: N/A Safety Measures: Safety Fall Prevention Plan has been given, discussed and signed Admission: Completed [facility] Orientation: Patient has been orientated to the room, unit and staff.  Family: son at bedside.  Orders have been reviewed and implemented. Will continue to monitor the patient. Call light has been placed within reach and bed alarm has been activated.   Amaryllis Dyke, RN

## 2020-09-16 NOTE — ED Notes (Signed)
Attempted report x 2 

## 2020-09-16 NOTE — Procedures (Signed)
I was present at this dialysis session. I have reviewed the session itself and made appropriate changes.   Vital signs in last 24 hours:  Temp:  [97.5 F (36.4 C)-98.4 F (36.9 C)] 98.4 F (36.9 C) (07/20 0200) Pulse Rate:  [87-102] 89 (07/20 0600) Resp:  [17-33] 22 (07/20 0600) BP: (100-137)/(57-80) 104/60 (07/20 0600) SpO2:  [90 %-100 %] 93 % (07/20 0600) Weight:  [77 kg] 77 kg (07/19 1317) Weight change:  Filed Weights   09/15/20 1317  Weight: 77 kg    Recent Labs  Lab 09/15/20 2025 09/16/20 0335  NA 137 135  K 3.4* 3.7  CL 99 99  CO2 24 25  GLUCOSE 90 81  BUN 31* 34*  CREATININE 7.61* 7.93*  CALCIUM 8.0* 8.0*  PHOS 4.2  --     Recent Labs  Lab 09/15/20 1526 09/15/20 2025 09/16/20 0335  WBC 6.7 8.2 7.3  NEUTROABS 5.8  --   --   HGB 10.2* 9.8* 9.8*  HCT 33.2* 30.4* 30.9*  MCV 108.1* 105.6* 107.3*  PLT 90* 91* 87*    Scheduled Meds:  Chlorhexidine Gluconate Cloth  6 each Topical Q0600   heparin  5,000 Units Subcutaneous Q8H   lidocaine  1 patch Transdermal Q24H   Continuous Infusions:  sodium chloride     sodium chloride     sodium chloride     azithromycin     cefTRIAXone (ROCEPHIN)  IV     PRN Meds:.sodium chloride, sodium chloride, sodium chloride, acetaminophen, alteplase, fentaNYL (SUBLIMAZE) injection, heparin, lidocaine (PF), lidocaine-prilocaine, ondansetron (ZOFRAN) IV, pentafluoroprop-tetrafluoroeth   Donetta Potts,  MD 09/16/2020, 8:28 AM

## 2020-09-16 NOTE — Progress Notes (Addendum)
PROGRESS NOTE    RON BESKE  POE:[id number] DOB: [birth year] DOA: 09/15/2020 PCP: [name]., MD   Brief Narrative:  HPI: Ricky Harris is a 80 y.o. male with medical history significant for ESRD on hemodialysis, liver cirrhosis, hypertension, hypothyroidism, AAA with stent graft, and coronary artery disease seen on CT scan, now presenting to the emergency department from his dialysis center for evaluation of right-sided chest pain.  Patient reports a nonproductive cough for 1 to 2 weeks, has developed some exertional dyspnea over this interval, and then right-sided chest pain in the past 2 to 3 days.  Pain is constant, localized to the right chest, worse with cough or deep breath, and then becoming severe during dialysis which was stopped after 30 minutes so that the patient could be evaluated in the ED.  He denies any fevers, chills, or leg swelling.  He urinates every several days only.  Reports that his weight has been increasing.   ED Course: Upon arrival to the ED, patient is found to be afebrile, saturating mid 90s on room air, mildly tachypneic, and with stable blood pressure.  EKG features sinus rhythm with LAD.  Chest x-ray with left greater than right pleural effusions.  CTA chest/abdomen/pelvis is negative for PE or aortic dissection but notable for ascending thoracic aortic aneurysm and new moderate pleural effusions bilaterally.  Chemistry panel with potassium 3.2.  CBC notable for macrocytic anemia with hemoglobin 10.2 and thrombocytopenia with platelets 90,000.  BNP is >4500 and high-sensitivity troponin 96 and then 87.  Nephrology was consulted by the ED physician, patient was treated with fentanyl, and hospitalist asked to admit.  Assessment & Plan:   Principal Problem:   Bilateral pleural effusion Active Problems:   Essential hypertension   AAA (abdominal aortic aneurysm) without rupture (HCC)   ESRD (end stage renal disease) (HCC)   Cirrhosis of liver (HCC)   Fluid  overload, unspecified   Thrombocytopenia (HCC)   Thoracic aortic aneurysm (HCC)   Elevated troponin   Right-sided chest pain   1. Bilateral pleural effusions; acute on chronic diastolic CHF   - BNP >3154 in ED, reports recent wt gain, had EF 60-65% on yr ago, has cirrhosis and ESRD and only urinates every several days.  Nephrology consulted.  This should be taken care of with scheduled hemodialysis sessions.  He saturating about 94% on room air.  I doubt he will need thoracentesis since he is not hypoxic and looks comfortable on clinical exam.    2. ESRD: Left HD after 30 minutes on the day of admission to come to ED for evaluation of right-sided chest pain.  Nephrology on board now.  Management per them.   3.  Liver cirrhosis  - Appears compensated    4.  Right-sided chest pain: Denies any chest pain today.  CTA negative for PE.  Likely pleuritic pain.   5. Elevated troponin: Slightly elevated but flat, not indicative of ACS.  Monitor symptoms.  6. AAA; thoracic aortic aneurysm  - CTA in ED negative for dissection but notable for 4.1 cm ascending thoracic aortic aneurysm and patent aortic stent graft with 6.1 cm native sac diameter up from 5.9 cm. outpatient follow-up recommended.   7. Thrombocytopenia - Platelets 90k in ED, appears chronic, no apparent bleeding or infection, likely related to chronic liver disease    8.  Community-acquired pneumonia: Chest x-ray also suspicious for bilateral, left greater than right airspace disease.  He has been having cough as well.  No leukocytosis, afebrile and no hypoxia.  I do think that he has community-acquired pneumonia.  He is allergic to Keflex so we will start him on Zosyn.  We will check urine and streptococci, Legionella and sputum culture.   DVT prophylaxis: heparin injection 5,000 Units Start: 09/15/20 2200   Code Status: Full Code  Family Communication: None present at bedside.  Plan of care discussed with patient in length and he  verbalized understanding and agreed with it.  Status is: Inpatient  Remains inpatient appropriate because:Inpatient level of care appropriate due to severity of illness  Dispo: The patient is from: Home              Anticipated d/c is to: Home              Patient currently is not medically stable to d/c.   Difficult to place patient No        Estimated body mass index is 23.02 kg/m as calculated from the following:   Height as of this encounter: 6' (1.829 m).   Weight as of this encounter: 77 kg.      Nutritional status:               Consultants:  Nephrology  Procedures:  None  Antimicrobials:  Anti-infectives (From admission, onward)    Start     Dose/Rate Route Frequency Ordered Stop   09/16/20 1600  ampicillin-sulbactam (UNASYN) 1.5 g in sodium chloride 0.9 % 100 mL IVPB        1.5 g 200 mL/hr over 30 Minutes Intravenous Every 24 hours 09/16/20 0931     09/16/20 0800  cefTRIAXone (ROCEPHIN) 1 g in sodium chloride 0.9 % 100 mL IVPB  Status:  Discontinued        1 g 200 mL/hr over 30 Minutes Intravenous Every 24 hours 09/16/20 0753 09/16/20 0903   09/16/20 0800  azithromycin (ZITHROMAX) 250 mg in dextrose 5 % 125 mL IVPB  Status:  Discontinued        250 mg 125 mL/hr over 60 Minutes Intravenous Every 24 hours 09/16/20 0753 09/16/20 0903          Subjective: Seen and examined in dialysis unit.  Feels better but has right-sided chest pain with coughing every time or taking deep breath.  No other complaint.  Objective: Vitals:   09/16/20 1130 09/16/20 1200 09/16/20 1230 09/16/20 1410  BP: 104/61 107/60 117/74 (!) 118/59  Pulse: 86 80 85 85  Resp:    14  Temp:      TempSrc:      SpO2:    94%  Weight:      Height:        Intake/Output Summary (Last 24 hours) at 09/16/2020 1421 Last data filed at 09/15/2020 2251 Gross per 24 hour  Intake 480 ml  Output --  Net 480 ml   Filed Weights   09/15/20 1317  Weight: 77 kg     Examination:  General exam: Appears calm and comfortable  Respiratory system: Diminished breath sounds bilaterally at the bases. Cardiovascular system: S1 & S2 heard, RRR. No JVD, murmurs, rubs, gallops or clicks. No pedal edema. Gastrointestinal system: Abdomen is nondistended, soft and nontender. No organomegaly or masses felt. Normal bowel sounds heard. Central nervous system: Alert and oriented. No focal neurological deficits. Extremities: Symmetric 5 x 5 power. Skin: No rashes, lesions or ulcers Psychiatry: Judgement and insight appear normal. Mood & affect appropriate.    Data Reviewed: I have personally  reviewed following labs and imaging studies  CBC: Recent Labs  Lab 09/15/20 1526 09/15/20 2025 09/16/20 0335  WBC 6.7 8.2 7.3  NEUTROABS 5.8  --   --   HGB 10.2* 9.8* 9.8*  HCT 33.2* 30.4* 30.9*  MCV 108.1* 105.6* 107.3*  PLT 90* 91* 87*   Basic Metabolic Panel: Recent Labs  Lab 09/15/20 1526 09/15/20 2025 09/16/20 0335  NA 140 137 135  K 3.2* 3.4* 3.7  CL 101 99 99  CO2 27 24 25   GLUCOSE 82 90 81  BUN 29* 31* 34*  CREATININE 7.10* 7.61* 7.93*  CALCIUM 8.3* 8.0* 8.0*  PHOS  --  4.2  --    GFR: Estimated Creatinine Clearance: 8.4 mL/min (A) (by C-G formula based on SCr of 7.93 mg/dL (H)). Liver Function Tests: Recent Labs  Lab 09/15/20 1526 09/15/20 2025  AST 25  --   ALT 14  --   ALKPHOS 108  --   BILITOT 1.0  --   PROT 6.4*  --   ALBUMIN 3.4* 2.8*   Recent Labs  Lab 09/15/20 1526  LIPASE 27   No results for input(s): AMMONIA in the last 168 hours. Coagulation Profile: Recent Labs  Lab 09/15/20 1526  INR 1.2   Cardiac Enzymes: No results for input(s): CKTOTAL, CKMB, CKMBINDEX, TROPONINI in the last 168 hours. BNP (last 3 results) No results for input(s): PROBNP in the last 8760 hours. HbA1C: No results for input(s): HGBA1C in the last 72 hours. CBG: No results for input(s): GLUCAP in the last 168 hours. Lipid Profile: No  results for input(s): CHOL, HDL, LDLCALC, TRIG, CHOLHDL, LDLDIRECT in the last 72 hours. Thyroid Function Tests: No results for input(s): TSH, T4TOTAL, FREET4, T3FREE, THYROIDAB in the last 72 hours. Anemia Panel: No results for input(s): VITAMINB12, FOLATE, FERRITIN, TIBC, IRON, RETICCTPCT in the last 72 hours. Sepsis Labs: No results for input(s): PROCALCITON, LATICACIDVEN in the last 168 hours.  Recent Results (from the past 240 hour(s))  Resp Panel by RT-PCR (Flu A&B, Covid) Nasopharyngeal Swab     Status: None   Collection Time: 09/15/20  4:14 PM   Specimen: Nasopharyngeal Swab; Nasopharyngeal(NP) swabs in vial transport medium  Result Value Ref Range Status   SARS Coronavirus 2 by RT PCR NEGATIVE NEGATIVE Final    Comment: (NOTE) SARS-CoV-2 target nucleic acids are NOT DETECTED.  The SARS-CoV-2 RNA is generally detectable in upper respiratory specimens during the acute phase of infection. The lowest concentration of SARS-CoV-2 viral copies this assay can detect is 138 copies/mL. A negative result does not preclude SARS-Cov-2 infection and should not be used as the sole basis for treatment or other patient management decisions. A negative result may occur with  improper specimen collection/handling, submission of specimen other than nasopharyngeal swab, presence of viral mutation(s) within the areas targeted by this assay, and inadequate number of viral copies(<138 copies/mL). A negative result must be combined with clinical observations, patient history, and epidemiological information. The expected result is Negative.  Fact Sheet for Patients:  EntrepreneurPulse.com.au  Fact Sheet for Healthcare Providers:  IncredibleEmployment.be  This test is no t yet approved or cleared by the [place] FDA and  has been authorized for detection and/or diagnosis of SARS-CoV-2 by FDA under an Emergency Use Authorization (EUA). This EUA will remain   in effect (meaning this test can be used) for the duration of the COVID-19 declaration under Section 564(b)(1) of the Act, 21 U.S.C.section 360bbb-3(b)(1), unless the authorization is terminated  or revoked  sooner.       Influenza A by PCR NEGATIVE NEGATIVE Final   Influenza B by PCR NEGATIVE NEGATIVE Final    Comment: (NOTE) The Xpert Xpress SARS-CoV-2/FLU/RSV plus assay is intended as an aid in the diagnosis of influenza from Nasopharyngeal swab specimens and should not be used as a sole basis for treatment. Nasal washings and aspirates are unacceptable for Xpert Xpress SARS-CoV-2/FLU/RSV testing.  Fact Sheet for Patients: EntrepreneurPulse.com.au  Fact Sheet for Healthcare Providers: IncredibleEmployment.be  This test is not yet approved or cleared by the [place] FDA and has been authorized for detection and/or diagnosis of SARS-CoV-2 by FDA under an Emergency Use Authorization (EUA). This EUA will remain in effect (meaning this test can be used) for the duration of the COVID-19 declaration under Section 564(b)(1) of the Act, 21 U.S.C. section 360bbb-3(b)(1), unless the authorization is terminated or revoked.  Performed at [facility], [place] [address], [place], [place] [address]       Radiology Studies: DG Chest Port 1 View  Result Date: 09/15/2020 CLINICAL DATA:  Onset right chest pain during dialysis today. EXAM: PORTABLE CHEST 1 VIEW COMPARISON:  PA and lateral chest 04/14/2020. FINDINGS: There is bilateral airspace disease and left greater than right pleural effusions. Cardiomegaly. Aortic atherosclerosis. No pneumothorax. No acute or focal bony abnormality. IMPRESSION: Left greater than right pleural effusions and airspace disease have an appearance most compatible with congestive failure but could reflect pneumonia. Cardiomegaly. Aortic Atherosclerosis (ICD10-I70.0). Electronically Signed   By: Inge Rise M.D.    On: 09/15/2020 16:03   CT Angio Chest/Abd/Pel for Dissection W and/or W/WO  Result Date: 09/15/2020 CLINICAL DATA:  Abdominal pain, suspected aortic dissection EXAM: CT ANGIOGRAPHY CHEST, ABDOMEN AND PELVIS TECHNIQUE: Multidetector CT imaging through the chest, abdomen and pelvis was performed using the standard protocol during bolus administration of intravenous contrast. Multiplanar reconstructed images and MIPs were obtained and reviewed to evaluate the vascular anatomy. CONTRAST:  123mL OMNIPAQUE IOHEXOL 350 MG/ML SOLN COMPARISON:  CT 04/14/2020 and previous FINDINGS: CTA CHEST FINDINGS Cardiovascular: 4-chamber mild cardiomegaly has developed. No pericardial effusion. Satisfactory opacification of pulmonary arteries noted, and there is no evidence of pulmonary emboli. Coarse coronary calcifications. Good contrast opacification of the thoracic aorta, without dissection or aneurysm. Moderate calcified atheromatous plaque in the arch and descending thoracic segment. 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 Aortic Root: --Valve: 2.7 cm with coarse calcifications on the leaflets --Sinuses: 3.6 cm --Sinotubular Junction: 3.1 cm Limitations by motion: Moderate Thoracic Aorta: --Ascending Aorta: 4.1   cm --Aortic Arch: 4 cm --Descending Aorta: 3.3 cm Mediastinum/Nodes: No mass or adenopathy. Lungs/Pleura: Moderate pleural effusions have developed. There are extensive opacities throughout the left lower lobe, and dependent atelectasis/consolidation in the right lower lobe. No pneumothorax. Musculoskeletal: Anterior vertebral endplate spurring at multiple levels in the lower thoracic spine. Left shoulder DJD. 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 Review of the MIP images confirms the above findings. CTA ABDOMEN AND PELVIS FINDINGS VASCULAR Aorta: Patent infrarenal bifurcated aortic stent graft as before, with mild narrowing of the left limb, stable.  Native sac diameter 6.1 x 6 cm maximum transverse dimensions (previously 5.9 by my measurement). Limited assessment for endoleak on this single phase study. Celiac: Patent.  Splenic artery diffusely calcified. SMA: Patent without evidence of aneurysm, dissection, vasculitis or significant stenosis. Renals: Single bilaterally, both with heavily calcified ostial plaque resulting in short segment stenosis of at least moderate severity, patent distally. IMA:  Proximal occlusion, reconstituted distally by visceral collaterals. Inflow: Right limb of the stent graft extends to the distal common iliac, well apposed. Left limb extends to distal common iliac, well apposed. Calcified plaque in the native internal iliac arteries without aneurysm. Native external iliac arteries are mildly tortuous, patent. Veins: No obvious venous abnormality within the limitations of this arterial phase study. Review of the MIP images confirms the above findings. NON-VASCULAR Hepatobiliary: No focal liver abnormality is seen. Status post cholecystectomy. No biliary dilatation. Pancreas: Unremarkable. No pancreatic ductal dilatation or surrounding inflammatory changes. Spleen: Normal in size without focal abnormality. Adrenals/Urinary Tract: Adrenal glands unremarkable. Bilateral renal parenchymal atrophy. No hydronephrosis. 2.5 cm exophytic probable cyst from the mid right kidney, stable. Urinary bladder is incompletely distended. Stomach/Bowel: Stomach is partially distended, unremarkable. The small bowel is decompressed. Normal appendix. Innumerable descending and sigmoid diverticula. Lymphatic: No abdominal or pelvic adenopathy. Reproductive: Penile pump components stable position. Previous prostatectomy. Other: Small volume abdominal ascites, new since previous. No free air. Musculoskeletal: Spondylitic changes in the lower lumbar spine. Left hip DJD. No displaced fracture or worrisome bone lesion. Review of the MIP images confirms the above  findings. IMPRESSION: 1. Negative for acute PE or thoracic aortic dissection. 2. 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 Recommend annual imaging followup by CTA or MRA. This recommendation follows 2010 ACCF/AHA/AATS/ACR/ASA/SCA/SCAI/SIR/STS/SVM Guidelines for the Diagnosis and Management of Patients with Thoracic Aortic Disease. Circulation. 2010; 121: X211-H417. 3. New moderate pleural effusions with bilateral pulmonary opacities. 4. Small volume  Abdominal ascites. 5. Patent aortic stent graft with 6.1 cm native sac diameter (previously 5.9). 6. Descending and sigmoid diverticulosis Electronically Signed   By: Lucrezia Europe M.D.   On: 09/15/2020 17:32    Scheduled Meds:  Chlorhexidine Gluconate Cloth  6 each Topical Q0600   heparin  5,000 Units Subcutaneous Q8H   lidocaine  1 patch Transdermal Q24H   Continuous Infusions:  sodium chloride     sodium chloride     sodium chloride     ampicillin-sulbactam (UNASYN) IV       LOS: 1 day   Time spent: 34 minutes   Darliss Cheney, MD [facility]  09/16/2020, 2:21 PM   How to contact the [facility] Attending or Consulting provider [place] or covering provider during after hours [place], for this patient?  Check the care team in [facility] and look for a) attending/consulting [facility] provider listed and b) the [facility] team listed. Page or secure chat [phone]. Log into [email or web address] and use [place]'s universal password to access. If you do not have the password, please contact the hospital operator. Locate the [facility] provider you are looking for under [facility] and page to a number that you can be directly reached. If you still have difficulty reaching the provider, please page the [facility] (Director on Call) for the Hospitalists listed on amion for assistance.

## 2020-09-16 NOTE — Progress Notes (Signed)
  Echocardiogram 2D Echocardiogram has been performed.  Ricky Harris 09/16/2020, 4:05 PM

## 2020-09-16 NOTE — ED Notes (Signed)
Attempted report x1. 

## 2020-09-17 DIAGNOSIS — J189 Pneumonia, unspecified organism: Secondary | ICD-10-CM

## 2020-09-17 LAB — BASIC METABOLIC PANEL
Anion gap: 10 (ref 5–15)
BUN: 18 mg/dL (ref 8–23)
CO2: 28 mmol/L (ref 22–32)
Calcium: 8 mg/dL — ABNORMAL LOW (ref 8.9–10.3)
Chloride: 99 mmol/L (ref 98–111)
Creatinine, Ser: 4.86 mg/dL — ABNORMAL HIGH (ref 0.61–1.24)
GFR, Estimated: 12 mL/min — ABNORMAL LOW (ref >60)
Glucose, Bld: 99 mg/dL (ref 70–99)
Potassium: 3.4 mmol/L — ABNORMAL LOW (ref 3.5–5.1)
Sodium: 137 mmol/L (ref 135–145)

## 2020-09-17 LAB — CBC
HCT: 32.1 % — ABNORMAL LOW (ref 39.0–52.0)
Hemoglobin: 10.3 g/dL — ABNORMAL LOW (ref 13.0–17.0)
MCH: 33.6 pg (ref 26.0–34.0)
MCHC: 32.1 g/dL (ref 30.0–36.0)
MCV: 104.6 fL — ABNORMAL HIGH (ref 80.0–100.0)
Platelets: 88 10*3/uL — ABNORMAL LOW (ref 150–400)
RBC: 3.07 MIL/uL — ABNORMAL LOW (ref 4.22–5.81)
RDW: 16.7 % — ABNORMAL HIGH (ref 11.5–15.5)
WBC: 6.9 10*3/uL (ref 4.0–10.5)
nRBC: 0 % (ref 0.0–0.2)

## 2020-09-17 MED ORDER — AMOXICILLIN-POT CLAVULANATE 500-125 MG PO TABS: 1.0000 | ORAL_TABLET | Freq: Two times a day (BID) | ORAL | 0 refills | Status: DC

## 2020-09-17 MED ORDER — POTASSIUM CHLORIDE CRYS ER 10 MEQ PO TBCR
10.0000 meq | EXTENDED_RELEASE_TABLET | Freq: Once | ORAL | Status: AC
  Administered 2020-09-17: 10 meq via ORAL
  Filled 2020-09-17: qty 1

## 2020-09-17 NOTE — Discharge Summary (Signed)
Physician Discharge Summary  Ricky Harris CBS:[id number] DOB: [birth year] DOA: 09/15/2020  PCP: [name]., MD  Admit date: 09/15/2020 Discharge date: 09/17/2020 30 Day Unplanned Readmission Risk Score    Flowsheet Row ED to Hosp-Admission (Current) from 09/15/2020 in [facility]  30 Day Unplanned Readmission Risk Score (%) 26.17 Filed at 09/17/2020 0801       This score is the patient's risk of an unplanned readmission within 30 days of being discharged (0 -100%). The score is based on dignosis, age, lab data, medications, orders, and past utilization.   Low:  0-14.9   Medium: 15-21.9   High: 22-29.9   Extreme: 30 and above          Admitted From: Home Disposition: Home  Recommendations for Outpatient Follow-up:  Follow up with PCP in 1-2 weeks Follow-up with outpatient hemodialysis Please obtain BMP/CBC in one week Please follow up with your PCP on the following pending results: Unresulted Labs (From admission, onward)     Start     Ordered   09/16/20 0749  Expectorated Sputum Assessment w Gram Stain, Rflx to Resp Cult  Once,   R        09/16/20 0748   09/16/20 0749  Strep pneumoniae urinary antigen  Once,   STAT        09/16/20 0748   09/16/20 0749  Legionella Pneumophila Serogp 1 Ur Ag  Once,   STAT        09/16/20 0748   09/16/20 8466  Basic metabolic panel  Daily,   R      09/15/20 2115   09/16/20 0500  CBC  Daily,   R      09/15/20 2115              Home Health: None Equipment/Devices: None  Discharge Condition: Stable CODE STATUS: Full code Diet recommendation: Renal and cardiac  Subjective: Seen and examined this morning.  Feels much better.  Off of oxygen, no shortness of breath or chest pain.  Was feeling so much better that he wanted to go home at the moment I saw him.  HPI: Ricky Harris is a 80 y.o. male with medical history significant for ESRD on hemodialysis, liver cirrhosis, hypertension, hypothyroidism, AAA with stent  graft, and coronary artery disease seen on CT scan, now presenting to the emergency department from his dialysis center for evaluation of right-sided chest pain.  Patient reports a nonproductive cough for 1 to 2 weeks, has developed some exertional dyspnea over this interval, and then right-sided chest pain in the past 2 to 3 days.  Pain is constant, localized to the right chest, worse with cough or deep breath, and then becoming severe during dialysis which was stopped after 30 minutes so that the patient could be evaluated in the ED.  He denies any fevers, chills, or leg swelling.  He urinates every several days only.  Reports that his weight has been increasing.   ED Course: Upon arrival to the ED, patient is found to be afebrile, saturating mid 90s on room air, mildly tachypneic, and with stable blood pressure.  EKG features sinus rhythm with LAD.  Chest x-ray with left greater than right pleural effusions.  CTA chest/abdomen/pelvis is negative for PE or aortic dissection but notable for ascending thoracic aortic aneurysm and new moderate pleural effusions bilaterally.  Chemistry panel with potassium 3.2.  CBC notable for macrocytic anemia with hemoglobin 10.2 and thrombocytopenia with platelets 90,000.  BNP  is >4500 and high-sensitivity troponin 96 and then 87.  Nephrology was consulted by the ED physician, patient was treated with fentanyl, and hospitalist asked to admit.  Brief/Interim Summary: As below.  Bilateral pleural effusions; acute on chronic diastolic CHF   - BNP >8242 in ED, reports recent wt gain, had EF 60-65% on yr ago, has cirrhosis and ESRD and only urinates every several days.  Nephrology consulted.  Patient underwent hemodialysis on the day of admission and then he will have another dialysis this afternoon before discharge.  Right-sided chest pain: Denies any chest pain today.  CTA negative for PE.  Likely pleuritic pain.   Elevated troponin: Slightly elevated but flat, not  indicative of ACS.     AAA; thoracic aortic aneurysm - CTA in ED negative for dissection but notable for 4.1 cm ascending thoracic aortic aneurysm and patent aortic stent graft with 6.1 cm native sac diameter up from 5.9 cm. outpatient follow-up recommended.  PCP to order repeat imaging study.   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  He has been having cough as well.  No leukocytosis, afebrile and no hypoxia.  I do think that he has community-acquired pneumonia.  He is allergic to Keflex so he was started on Zosyn.  He is being discharged on Augmentin.  Discharge Diagnoses:  Principal Problem:   Bilateral pleural effusion Active Problems:   Essential hypertension   AAA (abdominal aortic aneurysm) without rupture (HCC)   ESRD (end stage renal disease) (HCC)   Cirrhosis of liver (HCC)   Fluid overload, unspecified   Thrombocytopenia (HCC)   Thoracic aortic aneurysm (HCC)   Elevated troponin   Right-sided chest pain   CAP (community acquired pneumonia)    Discharge Instructions  Discharge Instructions     Ambulatory referral to Physical Therapy   Complete by: As directed       Allergies as of 09/17/2020       Reactions   Cephalexin Hives   Hydralazine Hives        Medication List     STOP taking these medications    calcitRIOL 0.25 MCG capsule Commonly known as: ROCALTROL   cloNIDine 0.1 MG tablet Commonly known as: CATAPRES   pantoprazole 40 MG tablet Commonly known as: Protonix       TAKE these medications    amLODipine 5 MG tablet Commonly known as: NORVASC Take 5 mg by mouth daily.   amoxicillin-clavulanate 500-125 MG tablet Commonly known as: Augmentin Take 1 tablet (500 mg total) by mouth 2 (two) times daily for 6 days.   atorvastatin  10 MG tablet Commonly known as: LIPITOR Take 10 mg by mouth daily.   calcium acetate 667 MG capsule Commonly known as: PHOSLO Take 1 capsule (667 mg total) by mouth 3 (three) times daily with meals.   carvedilol 12.5 MG tablet Commonly known as: COREG Take 12.5 mg by mouth 2 (two) times daily.   febuxostat 40 MG tablet Commonly known as: ULORIC Take 1 tablet (40 mg total) by mouth daily.   lactulose 10 GM/15ML solution Commonly known as: CHRONULAC Take 15 mLs (10 g total) by mouth daily.   multivitamin Tabs tablet Take 1 tablet by mouth daily.   traMADol 50 MG tablet Commonly known as: ULTRAM Take 1 tablet (50 mg total) by mouth every 8 (eight)  hours as needed for moderate pain.        Follow-up Information     [name]., MD Follow up in 1 week(s).   Specialty: Internal Medicine Contact information: [place] [address] [phone]                Allergies  Allergen Reactions   Cephalexin Hives   Hydralazine Hives    Consultations: Nephrology   Procedures/Studies: [facility] Chest Port 1 View  Result Date: 09/15/2020 CLINICAL DATA:  Onset right chest pain during dialysis today. EXAM: PORTABLE CHEST 1 VIEW COMPARISON:  PA and lateral chest 04/14/2020. FINDINGS: There is bilateral airspace disease and left greater than right pleural effusions. Cardiomegaly. Aortic atherosclerosis. No pneumothorax. No acute or focal bony abnormality. IMPRESSION: Left greater than right pleural effusions and airspace disease have an appearance most compatible with congestive failure but could reflect pneumonia. Cardiomegaly. Aortic Atherosclerosis (ICD10-I70.0). Electronically Signed   By: Inge Rise M.D.   On: 09/15/2020 16:03   ECHOCARDIOGRAM COMPLETE  Result Date: 09/16/2020    ECHOCARDIOGRAM REPORT   Patient Name:   Ricky Harris Date of Exam: 09/16/2020 Medical Rec #:  [id number]      Height:       72.0 in Accession #:    [id number]     Weight:        169.8 lb Date of Birth:  1940-11-01     BSA:          1.987 m Patient Age:    40 years       BP:           118/59 mmHg Patient Gender: M              HR:           96 bpm. Exam Location:  Inpatient Procedure: 2D Echo Indications:    acute diastolic chf  History:        Patient has prior history of Echocardiogram examinations, most                 recent 09/24/2019. Abdominal aortic aneurysm, Chronic kidney                 disease. Cirrhosis, Signs/Symptoms:elevated troponin; Risk                 Factors:Hypertension.  Sonographer:    Johny Chess Referring Phys: [id number] [place]  1. Left ventricular ejection fraction, by estimation, is 25 to 30%. The left ventricle has severely decreased function. The left ventricle demonstrates global hypokinesis. Left ventricular diastolic parameters are consistent with Grade I diastolic dysfunction (impaired relaxation).  2. Right ventricular systolic function is mildly reduced. The right ventricular size is normal. There is normal pulmonary artery systolic pressure. The estimated right ventricular systolic pressure is 78.4 mmHg.  3. Left atrial size was moderately dilated.  4. Right atrial size was mildly dilated.  5. The mitral valve is normal in structure. Trivial mitral valve regurgitation. No evidence of mitral stenosis. Moderate mitral annular calcification.  6. The aortic valve is tricuspid. Aortic valve regurgitation is not visualized. Mild to moderate aortic valve sclerosis/calcification is present, without any evidence of aortic stenosis.  7. Aortic dilatation noted. There is mild dilatation of the ascending aorta, measuring 40 mm.  8. The inferior vena cava is dilated in size with >50% respiratory variability, suggesting right atrial pressure of 8 mmHg. FINDINGS  Left Ventricle: Left ventricular ejection fraction, by  estimation, is 25 to 30%. The left ventricle has severely decreased function. The left ventricle demonstrates global hypokinesis.  The left ventricular internal cavity size was normal in size. There is no left ventricular hypertrophy. Left ventricular diastolic parameters are consistent with Grade I diastolic dysfunction (impaired relaxation). Right Ventricle: The right ventricular size is normal. No increase in right ventricular wall thickness. Right ventricular systolic function is mildly reduced. There is normal pulmonary artery systolic pressure. The tricuspid regurgitant velocity is 1.61 m/s, and with an assumed right atrial pressure of 8 mmHg, the estimated right ventricular systolic pressure is 23.5 mmHg. Left Atrium: Left atrial size was moderately dilated. Right Atrium: Right atrial size was mildly dilated. Pericardium: There is no evidence of pericardial effusion. Mitral Valve: The mitral valve is normal in structure. Moderate mitral annular calcification. Trivial mitral valve regurgitation. No evidence of mitral valve stenosis. Tricuspid Valve: The tricuspid valve is normal in structure. Tricuspid valve regurgitation is trivial. Aortic Valve: The aortic valve is tricuspid. Aortic valve regurgitation is not visualized. Mild to moderate aortic valve sclerosis/calcification is present, without any evidence of aortic stenosis. Pulmonic Valve: The pulmonic valve was normal in structure. Pulmonic valve regurgitation is not visualized. Aorta: The aortic root is normal in size and structure and aortic dilatation noted. There is mild dilatation of the ascending aorta, measuring 40 mm. Venous: The inferior vena cava is dilated in size with greater than 50% respiratory variability, suggesting right atrial pressure of 8 mmHg. IAS/Shunts: No atrial level shunt detected by color flow Doppler.  LEFT VENTRICLE PLAX 2D LVIDd:         5.60 cm      Diastology LVIDs:         4.40 cm      LV e' medial:    5.00 cm/s LV PW:         1.00 cm      LV E/e' medial:  19.5 LV IVS:        0.90 cm      LV e' lateral:   10.10 cm/s LVOT diam:     2.20 cm      LV E/e'  lateral: 9.7 LV SV:         92 LV SV Index:   46 LVOT Area:     3.80 cm  LV Volumes (MOD) LV vol d, MOD A2C: 154.0 ml LV vol d, MOD A4C: 199.0 ml LV vol s, MOD A2C: 107.0 ml LV vol s, MOD A4C: 137.0 ml LV SV MOD A2C:     47.0 ml LV SV MOD A4C:     199.0 ml LV SV MOD BP:      60.9 ml RIGHT VENTRICLE             IVC RV S prime:     13.30 cm/s  IVC diam: 2.40 cm TAPSE (M-mode): 2.4 cm LEFT ATRIUM              Index       RIGHT ATRIUM           Index LA diam:        5.10 cm  2.57 cm/m  RA Area:     21.90 cm LA Vol (A2C):   101.0 ml 50.83 ml/m RA Volume:   66.60 ml  33.52 ml/m LA Vol (A4C):   93.2 ml  46.90 ml/m LA Biplane Vol: 97.0 ml  48.81 ml/m  AORTIC VALVE LVOT Vmax:   125.00 cm/s LVOT Vmean:  85.200  cm/s LVOT VTI:    0.241 m  AORTA Ao Root diam: 3.50 cm Ao Asc diam:  4.00 cm MITRAL VALVE                TRICUSPID VALVE MV Area (PHT): 4.96 cm     TR Peak grad:   10.4 mmHg MV Decel Time: 153 msec     TR Vmax:        161.00 cm/s MV E velocity: 97.50 cm/s MV A velocity: 112.00 cm/s  SHUNTS MV E/A ratio:  0.87         Systemic VTI:  0.24 m                             Systemic Diam: 2.20 cm Loralie Champagne MD Electronically signed by Loralie Champagne MD Signature Date/Time: 09/16/2020/5:29:59 PM    Final    CT Angio Chest/Abd/Pel for Dissection W and/or W/WO  Result Date: 09/15/2020 CLINICAL DATA:  Abdominal pain, suspected aortic dissection EXAM: CT ANGIOGRAPHY CHEST, ABDOMEN AND PELVIS TECHNIQUE: Multidetector CT imaging through the chest, abdomen and pelvis was performed using the standard protocol during bolus administration of intravenous contrast. Multiplanar reconstructed images and MIPs were obtained and reviewed to evaluate the vascular anatomy. CONTRAST:  161mL OMNIPAQUE IOHEXOL 350 MG/ML SOLN COMPARISON:  CT 04/14/2020 and previous FINDINGS: CTA CHEST FINDINGS Cardiovascular: 4-chamber mild cardiomegaly has developed. No pericardial effusion. Satisfactory opacification of pulmonary arteries noted, and  there is no evidence of pulmonary emboli. Coarse coronary calcifications. Good contrast opacification of the thoracic aorta, without dissection or aneurysm. Moderate calcified atheromatous plaque in the arch and descending thoracic segment. 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 Aortic Root: --Valve: 2.7 cm with coarse calcifications on the leaflets --Sinuses: 3.6 cm --Sinotubular Junction: 3.1 cm Limitations by motion: Moderate Thoracic Aorta: --Ascending Aorta: 4.1   cm --Aortic Arch: 4 cm --Descending Aorta: 3.3 cm Mediastinum/Nodes: No mass or adenopathy. Lungs/Pleura: Moderate pleural effusions have developed. There are extensive opacities throughout the left lower lobe, and dependent atelectasis/consolidation in the right lower lobe. No pneumothorax. Musculoskeletal: Anterior vertebral endplate spurring at multiple levels in the lower thoracic spine. Left shoulder DJD. 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 Review of the MIP images confirms the above findings. CTA ABDOMEN AND PELVIS FINDINGS VASCULAR Aorta: Patent infrarenal bifurcated aortic stent graft as before, with mild narrowing of the left limb, stable. Native sac diameter 6.1 x 6 cm maximum transverse dimensions (previously 5.9 by my measurement). Limited assessment for endoleak on this single phase study. Celiac: Patent.  Splenic artery diffusely calcified. SMA: Patent without evidence of aneurysm, dissection, vasculitis or significant stenosis. Renals: Single bilaterally, both with heavily calcified ostial plaque resulting in short segment stenosis of at least moderate severity, patent distally. IMA: Proximal occlusion, reconstituted distally by visceral collaterals. Inflow: Right limb of the stent graft extends to the distal common iliac, well apposed. Left limb extends to distal common iliac, well apposed. Calcified plaque in the native internal iliac arteries without aneurysm.  Native external iliac arteries are mildly tortuous, patent. Veins: No obvious venous abnormality within the limitations of this arterial phase study. Review of the MIP images confirms the above findings. NON-VASCULAR Hepatobiliary: No focal liver abnormality is seen. Status post cholecystectomy. No biliary dilatation. Pancreas: Unremarkable. No pancreatic ductal dilatation or surrounding inflammatory changes. Spleen: Normal in size without focal abnormality. Adrenals/Urinary Tract: Adrenal glands unremarkable. Bilateral renal parenchymal atrophy. No  hydronephrosis. 2.5 cm exophytic probable cyst from the mid right kidney, stable. Urinary bladder is incompletely distended. Stomach/Bowel: Stomach is partially distended, unremarkable. The small bowel is decompressed. Normal appendix. Innumerable descending and sigmoid diverticula. Lymphatic: No abdominal or pelvic adenopathy. Reproductive: Penile pump components stable position. Previous prostatectomy. Other: Small volume abdominal ascites, new since previous. No free air. Musculoskeletal: Spondylitic changes in the lower lumbar spine. Left hip DJD. No displaced fracture or worrisome bone lesion. Review of the MIP images confirms the above findings. IMPRESSION: 1. Negative for acute PE or thoracic aortic dissection. 2. 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 Recommend annual imaging followup by CTA or MRA. This recommendation follows 2010 ACCF/AHA/AATS/ACR/ASA/SCA/SCAI/SIR/STS/SVM Guidelines for the Diagnosis and Management of Patients with Thoracic Aortic Disease. Circulation. 2010; 121: P102-H852. 3. New moderate pleural effusions with bilateral pulmonary opacities. 4. Small volume  Abdominal ascites. 5. Patent aortic stent graft with 6.1 cm native sac diameter (previously 5.9). 6. Descending and sigmoid diverticulosis Electronically Signed   By: Lucrezia Europe M.D.   On: 09/15/2020 17:32     Discharge Exam: Vitals:   09/17/20 0518 09/17/20 0909  BP: (!) 101/56  108/67  Pulse: 92 91  Resp: 18 18  Temp: 98.5 F (36.9 C) 98 F (36.7 C)  SpO2: 97% 100%   Vitals:   09/16/20 1721 09/16/20 2041 09/17/20 0518 09/17/20 0909  BP: 108/64 119/74 (!) 101/56 108/67  Pulse: 93 100 92 91  Resp: 19 18 18 18   Temp: 99.6 F (37.6 C) 98.9 F (37.2 C) 98.5 F (36.9 C) 98 F (36.7 C)  TempSrc: Oral Oral  Oral  SpO2: 99% 99% 97% 100%  Weight:      Height:        General: Pt is alert, awake, not in acute distress Cardiovascular: RRR, S1/S2 +, no rubs, no gallops Respiratory: Diminished breath sounds at the bases bilaterally. Abdominal: Soft, NT, ND, bowel sounds + Extremities: no edema, no cyanosis    The results of significant diagnostics from this hospitalization (including imaging, microbiology, ancillary and laboratory) are listed below for reference.     Microbiology: Recent Results (from the past 240 hour(s))  Resp Panel by RT-PCR (Flu A&B, Covid) Nasopharyngeal Swab     Status: None   Collection Time: 09/15/20  4:14 PM   Specimen: Nasopharyngeal Swab; Nasopharyngeal(NP) swabs in vial transport medium  Result Value Ref Range Status   SARS Coronavirus 2 by RT PCR NEGATIVE NEGATIVE Final    Comment: (NOTE) SARS-CoV-2 target nucleic acids are NOT DETECTED.  The SARS-CoV-2 RNA is generally detectable in upper respiratory specimens during the acute phase of infection. The lowest concentration of SARS-CoV-2 viral copies this assay can detect is 138 copies/mL. A negative result does not preclude SARS-Cov-2 infection and should not be used as the sole basis for treatment or other patient management decisions. A negative result may occur with  improper specimen collection/handling, submission of specimen other than nasopharyngeal swab, presence of viral mutation(s) within the areas targeted by this assay, and inadequate number of viral copies(<138 copies/mL). A negative result must be combined with clinical observations, patient history, and  epidemiological information. The expected result is Negative.  Fact Sheet for Patients:  EntrepreneurPulse.com.au  Fact Sheet for Healthcare Providers:  IncredibleEmployment.be  This test is no t yet approved or cleared by the [place] FDA and  has been authorized for detection and/or diagnosis of SARS-CoV-2 by FDA under an Emergency Use Authorization (EUA). This EUA will remain  in effect (meaning  this test can be used) for the duration of the COVID-19 declaration under Section 564(b)(1) of the Act, 21 U.S.C.section 360bbb-3(b)(1), unless the authorization is terminated  or revoked sooner.       Influenza A by PCR NEGATIVE NEGATIVE Final   Influenza B by PCR NEGATIVE NEGATIVE Final    Comment: (NOTE) The Xpert Xpress SARS-CoV-2/FLU/RSV plus assay is intended as an aid in the diagnosis of influenza from Nasopharyngeal swab specimens and should not be used as a sole basis for treatment. Nasal washings and aspirates are unacceptable for Xpert Xpress SARS-CoV-2/FLU/RSV testing.  Fact Sheet for Patients: EntrepreneurPulse.com.au  Fact Sheet for Healthcare Providers: IncredibleEmployment.be  This test is not yet approved or cleared by the [place] FDA and has been authorized for detection and/or diagnosis of SARS-CoV-2 by FDA under an Emergency Use Authorization (EUA). This EUA will remain in effect (meaning this test can be used) for the duration of the COVID-19 declaration under Section 564(b)(1) of the Act, 21 U.S.C. section 360bbb-3(b)(1), unless the authorization is terminated or revoked.  Performed at [facility], [place] [address] [place], [place] [address]      Labs: BNP (last 3 results) Recent Labs    09/23/19 1813 09/15/20 1618  BNP 490.9* >8,416.6*   Basic Metabolic Panel: Recent Labs  Lab 09/15/20 1526 09/15/20 2025 09/16/20 0335 09/17/20 0319  NA 140 137 135 137   K 3.2* 3.4* 3.7 3.4*  CL 101 99 99 99  CO2 27 24 25 28   GLUCOSE 82 90 81 99  BUN 29* 31* 34* 18  CREATININE 7.10* 7.61* 7.93* 4.86*  CALCIUM 8.3* 8.0* 8.0* 8.0*  PHOS  --  4.2  --   --    Liver Function Tests: Recent Labs  Lab 09/15/20 1526 09/15/20 2025  AST 25  --   ALT 14  --   ALKPHOS 108  --   BILITOT 1.0  --   PROT 6.4*  --   ALBUMIN 3.4* 2.8*   Recent Labs  Lab 09/15/20 1526  LIPASE 27   No results for input(s): AMMONIA in the last 168 hours. CBC: Recent Labs  Lab 09/15/20 1526 09/15/20 2025 09/16/20 0335 09/17/20 0319  WBC 6.7 8.2 7.3 6.9  NEUTROABS 5.8  --   --   --   HGB 10.2* 9.8* 9.8* 10.3*  HCT 33.2* 30.4* 30.9* 32.1*  MCV 108.1* 105.6* 107.3* 104.6*  PLT 90* 91* 87* 88*   Cardiac Enzymes: No results for input(s): CKTOTAL, CKMB, CKMBINDEX, TROPONINI in the last 168 hours. BNP: Invalid input(s): POCBNP CBG: No results for input(s): GLUCAP in the last 168 hours. D-Dimer No results for input(s): DDIMER in the last 72 hours. Hgb A1c No results for input(s): HGBA1C in the last 72 hours. Lipid Profile No results for input(s): CHOL, HDL, LDLCALC, TRIG, CHOLHDL, LDLDIRECT in the last 72 hours. Thyroid function studies No results for input(s): TSH, T4TOTAL, T3FREE, THYROIDAB in the last 72 hours.  Invalid input(s): FREET3 Anemia work up No results for input(s): VITAMINB12, FOLATE, FERRITIN, TIBC, IRON, RETICCTPCT in the last 72 hours. Urinalysis    Component Value Date/Time   COLORURINE YELLOW 01/18/2019 1230   APPEARANCEUR CLEAR 01/18/2019 1230   LABSPEC 1.017 01/18/2019 1230   PHURINE 5.0 01/18/2019 1230   GLUCOSEU NEGATIVE 01/18/2019 1230   HGBUR NEGATIVE 01/18/2019 [place] 01/18/2019 [place] 01/18/2019 1230   PROTEINUR 100 (A) 01/18/2019 1230   NITRITE NEGATIVE 01/18/2019 1230  LEUKOCYTESUR NEGATIVE 01/18/2019 1230   Sepsis Labs Invalid input(s): PROCALCITONIN,  WBC,   LACTICIDVEN Microbiology Recent Results (from the past 240 hour(s))  Resp Panel by RT-PCR (Flu A&B, Covid) Nasopharyngeal Swab     Status: None   Collection Time: 09/15/20  4:14 PM   Specimen: Nasopharyngeal Swab; Nasopharyngeal(NP) swabs in vial transport medium  Result Value Ref Range Status   SARS Coronavirus 2 by RT PCR NEGATIVE NEGATIVE Final    Comment: (NOTE) SARS-CoV-2 target nucleic acids are NOT DETECTED.  The SARS-CoV-2 RNA is generally detectable in upper respiratory specimens during the acute phase of infection. The lowest concentration of SARS-CoV-2 viral copies this assay can detect is 138 copies/mL. A negative result does not preclude SARS-Cov-2 infection and should not be used as the sole basis for treatment or other patient management decisions. A negative result may occur with  improper specimen collection/handling, submission of specimen other than nasopharyngeal swab, presence of viral mutation(s) within the areas targeted by this assay, and inadequate number of viral copies(<138 copies/mL). A negative result must be combined with clinical observations, patient history, and epidemiological information. The expected result is Negative.  Fact Sheet for Patients:  EntrepreneurPulse.com.au  Fact Sheet for Healthcare Providers:  IncredibleEmployment.be  This test is no t yet approved or cleared by the [place] FDA and  has been authorized for detection and/or diagnosis of SARS-CoV-2 by FDA under an Emergency Use Authorization (EUA). This EUA will remain  in effect (meaning this test can be used) for the duration of the COVID-19 declaration under Section 564(b)(1) of the Act, 21 U.S.C.section 360bbb-3(b)(1), unless the authorization is terminated  or revoked sooner.       Influenza A by PCR NEGATIVE NEGATIVE Final   Influenza B by PCR NEGATIVE NEGATIVE Final    Comment: (NOTE) The Xpert Xpress SARS-CoV-2/FLU/RSV plus  assay is intended as an aid in the diagnosis of influenza from Nasopharyngeal swab specimens and should not be used as a sole basis for treatment. Nasal washings and aspirates are unacceptable for Xpert Xpress SARS-CoV-2/FLU/RSV testing.  Fact Sheet for Patients: EntrepreneurPulse.com.au  Fact Sheet for Healthcare Providers: IncredibleEmployment.be  This test is not yet approved or cleared by the [place] FDA and has been authorized for detection and/or diagnosis of SARS-CoV-2 by FDA under an Emergency Use Authorization (EUA). This EUA will remain in effect (meaning this test can be used) for the duration of the COVID-19 declaration under Section 564(b)(1) of the Act, 21 U.S.C. section 360bbb-3(b)(1), unless the authorization is terminated or revoked.  Performed at [facility], [place] [address] [place], [place] [address]      Time coordinating discharge: Over 30 minutes  SIGNED:   Darliss Cheney, MD  [facility] 09/17/2020, 9:46 AM  If 7PM-7AM, please contact night-coverage [email or web address]

## 2020-09-17 NOTE — Evaluation (Signed)
Occupational Therapy Evaluation Patient Details Name: Ricky Harris MRN: [id number] DOB: 05-30-1940 Today's Date: 09/17/2020    History of Present Illness 80yo male admitted 09/15/20 presenting with R chest pain which developed during HD session. Only able to tolerate 30 minutes of HD due to pain. Negative for PE and aortic dissection. Found to have B pleural effusions and acute on chronic CHF. PMH AAA, aortic stenosis, CA, CKD, gout, HLD, HTN, pre-DM   Clinical Impression   PTA patient was living with his son in a private residence and was grossly I with ADLs/IADLs without AD. Patient currently requiring Min guard to Min a grossly for observed ADLs, ADL transfers and household mobility. Patient also limited by deficits listed below including decreased balance and decreased activity tolerance and would benefit from continued acute OT services in prep for safe d/c home with recommendation for OPOT and initial 24hr supervision/assist.     Follow Up Recommendations  Outpatient OT;Supervision/Assistance - 24 hour    Equipment Recommendations  None recommended by OT    Recommendations for Other Services       Precautions / Restrictions Precautions Precautions: Fall;Other (comment) Precaution Comments: chronic back pain Restrictions Weight Bearing Restrictions: No      Mobility Bed Mobility Overal bed mobility: Independent                  Transfers Overall transfer level: Needs assistance   Transfers: Sit to/from Stand Sit to Stand: Min guard         General transfer comment: Min guard for steadying/safety.    Balance Overall balance assessment: Needs assistance Sitting-balance support: No upper extremity supported;Feet supported Sitting balance-Leahy Scale: Good     Standing balance support: No upper extremity supported;Bilateral upper extremity supported;During functional activity Standing balance-Leahy Scale: Poor Standing balance comment: Reliant on BUE on  RW.                           ADL either performed or assessed with clinical judgement   ADL Overall ADL's : Needs assistance/impaired     Grooming: Min guard;Standing Grooming Details (indicate cue type and reason): Min guard for steadying/safety             Lower Body Dressing: Min guard;Sit to/from stand Lower Body Dressing Details (indicate cue type and reason): Min guard for steadying/safety. Toilet Transfer: Magazine features editor Details (indicate cue type and reason): Simulated with transfer to recliner with HHA +1 and cues for hand placement.         Functional mobility during ADLs: Minimal assistance General ADL Comments: Patient limited by decreased balance, decreased activity tolerance and chronic low back pain.     Vision   Vision Assessment?: No apparent visual deficits     Perception     Praxis      Pertinent Vitals/Pain Pain Assessment: Faces Faces Pain Scale: Hurts little more Pain Location: Low back (chronic) Pain Descriptors / Indicators: Aching;Sore Pain Intervention(s): Limited activity within patient's tolerance     Hand Dominance     Extremity/Trunk Assessment Upper Extremity Assessment Upper Extremity Assessment: Overall WFL for tasks assessed   Lower Extremity Assessment Lower Extremity Assessment: Overall WFL for tasks assessed   Cervical / Trunk Assessment Cervical / Trunk Assessment: Kyphotic   Communication Communication Communication: HOH   Cognition Arousal/Alertness: Awake/alert Behavior During Therapy: WFL for tasks assessed/performed Overall Cognitive Status: Within Functional Limits for tasks assessed  General Comments  HR 90's at rest and 100's with short-distance mobility and SpO2 99% throughout.    Exercises     Shoulder Instructions      Home Living Family/patient expects to be discharged to:: Private residence Living Arrangements:  Children Available Help at Discharge: Family;Available PRN/intermittently Type of Home: House Home Access: Stairs to enter [employer] of Steps: 5 Entrance Stairs-Rails: Right;Left;Can reach both Home Layout: One level     Bathroom Shower/Tub: Occupational psychologist: Standard     Home Equipment: Environmental consultant - 2 wheels;Grab bars - tub/shower;Grab bars - toilet   Additional Comments: hx of remote fall when R knee buckled going up the steps      Prior Functioning/Environment Level of Independence: Independent        Comments: I with ADLs/IADLs without AD; reports driving PRN but family assists with transportation to and from dialysis; manages meds with I.        OT Problem List: Decreased strength;Decreased activity tolerance;Impaired balance (sitting and/or standing);Decreased knowledge of use of DME or AE;Cardiopulmonary status limiting activity      OT Treatment/Interventions: Self-care/ADL training;Therapeutic exercise;Energy conservation;DME and/or AE instruction;Therapeutic activities;Patient/family education;Balance training    OT Goals(Current goals can be found in the care plan section) Acute Rehab OT Goals Patient Stated Goal: To return home. OT Goal Formulation: With patient Time For Goal Achievement: 10/01/20 Potential to Achieve Goals: Good ADL Goals Pt Will Perform Grooming: standing;with modified independence Pt Will Perform Upper Body Dressing: with modified independence Pt Will Perform Lower Body Dressing: with modified independence;sit to/from stand Pt Will Transfer to Toilet: with modified independence;ambulating Pt Will Perform Toileting - Clothing Manipulation and hygiene: with modified independence;sit to/from stand Additional ADL Goal #1: Patient will recall 3 energy conservation techniques in prep for ADLs/IADLs.  OT Frequency: Min 2X/week   Barriers to D/C:            Co-evaluation              AM-PAC OT "6 Clicks"  Daily Activity     Outcome Measure Help from another person eating meals?: None Help from another person taking care of personal grooming?: A Little Help from another person toileting, which includes using toliet, bedpan, or urinal?: A Little Help from another person bathing (including washing, rinsing, drying)?: A Lot Help from another person to put on and taking off regular upper body clothing?: A Little Help from another person to put on and taking off regular lower body clothing?: A Little 6 Click Score: 18   End of Session Equipment Utilized During Treatment: Gait belt;Rolling walker Nurse Communication: Mobility status  Activity Tolerance: Patient tolerated treatment well Patient left: in chair;with call bell/phone within reach  OT Visit Diagnosis: Unsteadiness on feet (R26.81);Muscle weakness (generalized) (M62.81);History of falling (Z91.81)                Time: 4967-5916 OT Time Calculation (min): 17 min Charges:  OT General Charges $OT Visit: 1 Visit OT Evaluation $OT Eval Low Complexity: 1 Low  Lex Linhares H. OTR/L Supplemental OT, Department of rehab services [phone]  Telesia Ates R H. 09/17/2020, 1:16 PM

## 2020-09-17 NOTE — Progress Notes (Signed)
Notified by telemetry and dialysis nurse pt was in A-fib during transit but converted back to normal sinus rhythm. Night time provider on call notified. Will continue to monitor.

## 2020-09-17 NOTE — Progress Notes (Addendum)
[facility] Progress Note   Subjective:  Seen in room - feeling a lot better today. Cough and dyspnea are less. No CP. S/p extra HD yesterday with net 2.5L off. Plan is for HD again today to get back on usual TTS schedule. He is hoping to go home later today.  Objective Vitals:   09/16/20 1721 09/16/20 2041 09/17/20 0518 09/17/20 0909  BP: 108/64 119/74 (!) 101/56 108/67  Pulse: 93 100 92 91  Resp: 19 18 18 18   Temp: 99.6 F (37.6 C) 98.9 F (37.2 C) 98.5 F (36.9 C) 98 F (36.7 C)  TempSrc: Oral Oral  Oral  SpO2: 99% 99% 97% 100%  Weight:      Height:       Physical Exam General: Well appearing man, NAD. Room air. Heart: RRR; no murmur Lungs: Clear in upper lobes, dull B bases Abdomen: soft, non-tender Extremities: No LE edema Dialysis Access: L AVF + bruit  Additional Objective Labs: Basic Metabolic Panel: Recent Labs  Lab 09/15/20 2025 09/16/20 0335 09/17/20 0319  NA 137 135 137  K 3.4* 3.7 3.4*  CL 99 99 99  CO2 24 25 28   GLUCOSE 90 81 99  BUN 31* 34* 18  CREATININE 7.61* 7.93* 4.86*  CALCIUM 8.0* 8.0* 8.0*  PHOS 4.2  --   --    Liver Function Tests: Recent Labs  Lab 09/15/20 1526 09/15/20 2025  AST 25  --   ALT 14  --   ALKPHOS 108  --   BILITOT 1.0  --   PROT 6.4*  --   ALBUMIN 3.4* 2.8*   Recent Labs  Lab 09/15/20 1526  LIPASE 27   CBC: Recent Labs  Lab 09/15/20 1526 09/15/20 2025 09/16/20 0335 09/17/20 0319  WBC 6.7 8.2 7.3 6.9  NEUTROABS 5.8  --   --   --   HGB 10.2* 9.8* 9.8* 10.3*  HCT 33.2* 30.4* 30.9* 32.1*  MCV 108.1* 105.6* 107.3* 104.6*  PLT 90* 91* 87* 88*   Studies/Results: DG Chest Port 1 View  Result Date: 09/15/2020 CLINICAL DATA:  Onset right chest pain during dialysis today. EXAM: PORTABLE CHEST 1 VIEW COMPARISON:  PA and lateral chest 04/14/2020. FINDINGS: There is bilateral airspace disease and left greater than right pleural effusions. Cardiomegaly. Aortic atherosclerosis. No pneumothorax. No  acute or focal bony abnormality. IMPRESSION: Left greater than right pleural effusions and airspace disease have an appearance most compatible with congestive failure but could reflect pneumonia. Cardiomegaly. Aortic Atherosclerosis (ICD10-I70.0). Electronically Signed   By: Inge Rise M.D.   On: 09/15/2020 16:03   ECHOCARDIOGRAM COMPLETE  Result Date: 09/16/2020    ECHOCARDIOGRAM REPORT   Patient Name:   Ricky Harris Date of Exam: 09/16/2020 Medical Rec #:  [id number]      Height:       72.0 in Accession #:    [id number]     Weight:       169.8 lb Date of Birth:  01/25/1941     BSA:          1.987 m Patient Age:    80 years       BP:           118/59 mmHg Patient Gender: M              HR:           96 bpm. Exam Location:  Inpatient Procedure: 2D Echo Indications:    acute diastolic chf  History:  Patient has prior history of Echocardiogram examinations, most                 recent 09/24/2019. Abdominal aortic aneurysm, Chronic kidney                 disease. Cirrhosis, Signs/Symptoms:elevated troponin; Risk                 Factors:Hypertension.  Sonographer:    Johny Chess Referring Phys: [id number] [place]  1. Left ventricular ejection fraction, by estimation, is 25 to 30%. The left ventricle has severely decreased function. The left ventricle demonstrates global hypokinesis. Left ventricular diastolic parameters are consistent with Grade I diastolic dysfunction (impaired relaxation).  2. Right ventricular systolic function is mildly reduced. The right ventricular size is normal. There is normal pulmonary artery systolic pressure. The estimated right ventricular systolic pressure is 45.4 mmHg.  3. Left atrial size was moderately dilated.  4. Right atrial size was mildly dilated.  5. The mitral valve is normal in structure. Trivial mitral valve regurgitation. No evidence of mitral stenosis. Moderate mitral annular calcification.  6. The aortic valve is tricuspid. Aortic valve  regurgitation is not visualized. Mild to moderate aortic valve sclerosis/calcification is present, without any evidence of aortic stenosis.  7. Aortic dilatation noted. There is mild dilatation of the ascending aorta, measuring 40 mm.  8. The inferior vena cava is dilated in size with >50% respiratory variability, suggesting right atrial pressure of 8 mmHg. FINDINGS  Left Ventricle: Left ventricular ejection fraction, by estimation, is 25 to 30%. The left ventricle has severely decreased function. The left ventricle demonstrates global hypokinesis. The left ventricular internal cavity size was normal in size. There is no left ventricular hypertrophy. Left ventricular diastolic parameters are consistent with Grade I diastolic dysfunction (impaired relaxation). Right Ventricle: The right ventricular size is normal. No increase in right ventricular wall thickness. Right ventricular systolic function is mildly reduced. There is normal pulmonary artery systolic pressure. The tricuspid regurgitant velocity is 1.61 m/s, and with an assumed right atrial pressure of 8 mmHg, the estimated right ventricular systolic pressure is 09.8 mmHg. Left Atrium: Left atrial size was moderately dilated. Right Atrium: Right atrial size was mildly dilated. Pericardium: There is no evidence of pericardial effusion. Mitral Valve: The mitral valve is normal in structure. Moderate mitral annular calcification. Trivial mitral valve regurgitation. No evidence of mitral valve stenosis. Tricuspid Valve: The tricuspid valve is normal in structure. Tricuspid valve regurgitation is trivial. Aortic Valve: The aortic valve is tricuspid. Aortic valve regurgitation is not visualized. Mild to moderate aortic valve sclerosis/calcification is present, without any evidence of aortic stenosis. Pulmonic Valve: The pulmonic valve was normal in structure. Pulmonic valve regurgitation is not visualized. Aorta: The aortic root is normal in size and structure and  aortic dilatation noted. There is mild dilatation of the ascending aorta, measuring 40 mm. Venous: The inferior vena cava is dilated in size with greater than 50% respiratory variability, suggesting right atrial pressure of 8 mmHg. IAS/Shunts: No atrial level shunt detected by color flow Doppler.  LEFT VENTRICLE PLAX 2D LVIDd:         5.60 cm      Diastology LVIDs:         4.40 cm      LV e' medial:    5.00 cm/s LV PW:         1.00 cm      LV E/e' medial:  19.5 LV IVS:  0.90 cm      LV e' lateral:   10.10 cm/s LVOT diam:     2.20 cm      LV E/e' lateral: 9.7 LV SV:         92 LV SV Index:   46 LVOT Area:     3.80 cm  LV Volumes (MOD) LV vol d, MOD A2C: 154.0 ml LV vol d, MOD A4C: 199.0 ml LV vol s, MOD A2C: 107.0 ml LV vol s, MOD A4C: 137.0 ml LV SV MOD A2C:     47.0 ml LV SV MOD A4C:     199.0 ml LV SV MOD BP:      60.9 ml RIGHT VENTRICLE             IVC RV S prime:     13.30 cm/s  IVC diam: 2.40 cm TAPSE (M-mode): 2.4 cm LEFT ATRIUM              Index       RIGHT ATRIUM           Index LA diam:        5.10 cm  2.57 cm/m  RA Area:     21.90 cm LA Vol (A2C):   101.0 ml 50.83 ml/m RA Volume:   66.60 ml  33.52 ml/m LA Vol (A4C):   93.2 ml  46.90 ml/m LA Biplane Vol: 97.0 ml  48.81 ml/m  AORTIC VALVE LVOT Vmax:   125.00 cm/s LVOT Vmean:  85.200 cm/s LVOT VTI:    0.241 m  AORTA Ao Root diam: 3.50 cm Ao Asc diam:  4.00 cm MITRAL VALVE                TRICUSPID VALVE MV Area (PHT): 4.96 cm     TR Peak grad:   10.4 mmHg MV Decel Time: 153 msec     TR Vmax:        161.00 cm/s MV E velocity: 97.50 cm/s MV A velocity: 112.00 cm/s  SHUNTS MV E/A ratio:  0.87         Systemic VTI:  0.24 m                             Systemic Diam: 2.20 cm Loralie Champagne MD Electronically signed by Loralie Champagne MD Signature Date/Time: 09/16/2020/5:29:59 PM    Final    CT Angio Chest/Abd/Pel for Dissection W and/or W/WO  Result Date: 09/15/2020 CLINICAL DATA:  Abdominal pain, suspected aortic dissection EXAM: CT ANGIOGRAPHY CHEST,  ABDOMEN AND PELVIS TECHNIQUE: Multidetector CT imaging through the chest, abdomen and pelvis was performed using the standard protocol during bolus administration of intravenous contrast. Multiplanar reconstructed images and MIPs were obtained and reviewed to evaluate the vascular anatomy. CONTRAST:  117mL OMNIPAQUE IOHEXOL 350 MG/ML SOLN COMPARISON:  CT 04/14/2020 and previous FINDINGS: CTA CHEST FINDINGS Cardiovascular: 4-chamber mild cardiomegaly has developed. No pericardial effusion. Satisfactory opacification of pulmonary arteries noted, and there is no evidence of pulmonary emboli. Coarse coronary calcifications. Good contrast opacification of the thoracic aorta, without dissection or aneurysm. Moderate calcified atheromatous plaque in the arch and descending thoracic segment. 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 Aortic Root: --Valve: 2.7 cm with coarse calcifications on the leaflets --Sinuses: 3.6 cm --Sinotubular Junction: 3.1 cm Limitations by motion: Moderate Thoracic Aorta: --Ascending Aorta: 4.1   cm --Aortic Arch: 4 cm --Descending Aorta: 3.3 cm Mediastinum/Nodes: No mass or adenopathy. Lungs/Pleura: Moderate pleural  effusions have developed. There are extensive opacities throughout the left lower lobe, and dependent atelectasis/consolidation in the right lower lobe. No pneumothorax. Musculoskeletal: Anterior vertebral endplate spurring at multiple levels in the lower thoracic spine. Left shoulder DJD. 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 Review of the MIP images confirms the above findings. CTA ABDOMEN AND PELVIS FINDINGS VASCULAR Aorta: Patent infrarenal bifurcated aortic stent graft as before, with mild narrowing of the left limb, stable. Native sac diameter 6.1 x 6 cm maximum transverse dimensions (previously 5.9 by my measurement). Limited assessment for endoleak on this single phase study. Celiac: Patent.  Splenic artery  diffusely calcified. SMA: Patent without evidence of aneurysm, dissection, vasculitis or significant stenosis. Renals: Single bilaterally, both with heavily calcified ostial plaque resulting in short segment stenosis of at least moderate severity, patent distally. IMA: Proximal occlusion, reconstituted distally by visceral collaterals. Inflow: Right limb of the stent graft extends to the distal common iliac, well apposed. Left limb extends to distal common iliac, well apposed. Calcified plaque in the native internal iliac arteries without aneurysm. Native external iliac arteries are mildly tortuous, patent. Veins: No obvious venous abnormality within the limitations of this arterial phase study. Review of the MIP images confirms the above findings. NON-VASCULAR Hepatobiliary: No focal liver abnormality is seen. Status post cholecystectomy. No biliary dilatation. Pancreas: Unremarkable. No pancreatic ductal dilatation or surrounding inflammatory changes. Spleen: Normal in size without focal abnormality. Adrenals/Urinary Tract: Adrenal glands unremarkable. Bilateral renal parenchymal atrophy. No hydronephrosis. 2.5 cm exophytic probable cyst from the mid right kidney, stable. Urinary bladder is incompletely distended. Stomach/Bowel: Stomach is partially distended, unremarkable. The small bowel is decompressed. Normal appendix. Innumerable descending and sigmoid diverticula. Lymphatic: No abdominal or pelvic adenopathy. Reproductive: Penile pump components stable position. Previous prostatectomy. Other: Small volume abdominal ascites, new since previous. No free air. Musculoskeletal: Spondylitic changes in the lower lumbar spine. Left hip DJD. No displaced fracture or worrisome bone lesion. Review of the MIP images confirms the above findings. IMPRESSION: 1. Negative for acute PE or thoracic aortic dissection. 2. 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 Recommend annual imaging followup by CTA or MRA. This  recommendation follows 2010 ACCF/AHA/AATS/ACR/ASA/SCA/SCAI/SIR/STS/SVM Guidelines for the Diagnosis and Management of Patients with Thoracic Aortic Disease. Circulation. 2010; 121: I502-D741. 3. New moderate pleural effusions with bilateral pulmonary opacities. 4. Small volume  Abdominal ascites. 5. Patent aortic stent graft with 6.1 cm native sac diameter (previously 5.9). 6. Descending and sigmoid diverticulosis Electronically Signed   By: Lucrezia Europe M.D.   On: 09/15/2020 17:32   Medications:  sodium chloride     sodium chloride     sodium chloride     ampicillin-sulbactam (UNASYN) IV 1.5 g (09/16/20 2103)    Chlorhexidine Gluconate Cloth  6 each Topical Q0600   heparin  5,000 Units Subcutaneous Q8H   lidocaine  1 patch Transdermal Q24H    Dialysis Orders: TTS [place] 4hr, 350/500, EDW 77.5kg, 3K/2.5Ca, AVF, heparin 3000 bolus - Venofer 50mg  IV q week - Calcitriol 0.40mcg PO q HD   Assessment/Plan:  Chest pain/B pleural effusions/pneumonia: CTA neg for PE/dissection. S/p extra HD on 7/20. On Unasyn for pneumonia coverage.   ESRD: Usual TTS schedule , s/p extra HD yesterday, for HD again today per usual sched.  Hypertension/volume: BP low/normal, on Coreg at home - held here for now.  Anemia: Hgb 10.3 - follow without ESA for now.  Metabolic bone disease: Ca/Phos ok - continue home binders/VDRA.  Cirrhosis/thrombocytopenia  Elevated Trop: Follow trends  AAA/new ascending thoracic AA: For OP follow-up per notes.  Thrombocytopenia  New HFrEF (EF 25-30%): Prev 60-65% last year, further w/u per primary.  Veneta Penton, PA-C 09/17/2020, 10:06 AM  [facility]  I have seen and examined this patient and agree with plan and assessment in the above note with renal recommendations/intervention highlighted.  Broadus John A Marston Mccadden,MD 09/17/2020 11:35 AM

## 2020-09-17 NOTE — Evaluation (Signed)
Physical Therapy Evaluation Patient Details Name: Ricky Harris MRN: [id number] DOB: [birth year] Today's Date: 09/17/2020   History of Present Illness  80yo male admitted 09/15/20 presenting with R chest pain which developed during HD session. Only able to tolerate 30 minutes of HD due to pain. Negative for PE and aortic dissection. Found to have B pleural effusions and acute on chronic CHF. PMH AAA, aortic stenosis, CA, CKD, gout, HLD, HTN, pre-DM  Clinical Impression   Patient received in bed, very pleasant and cooperative with therapy today. Able to mobilize on a min guard-MinA basis without device, gait somewhat limited by back pain but he did much better with U HHA- feel that mobility would improve significantly (likely to Mod independent level) with use of RW, and discussed this with him. No signs of dyspnea, increased WOB, or other signs of O2 desat with activity. Left in bed with all needs met and questions addressed, nursing staff aware of patient status. Would benefit from skilled OP PT f/u.     Follow Up Recommendations Outpatient PT    Equipment Recommendations  None recommended by PT    Recommendations for Other Services       Precautions / Restrictions Precautions Precautions: Fall;Other (comment) Precaution Comments: chronic back pain Restrictions Weight Bearing Restrictions: No      Mobility  Bed Mobility Overal bed mobility: Independent                  Transfers Overall transfer level: Needs assistance   Transfers: Sit to/from Stand Sit to Stand: Min guard         General transfer comment: min guard for safety, dizzy for a moment but this improved within a few seconds of standing  Ambulation/Gait Ambulation/Gait assistance: Min assist Gait Distance (Feet): 100 Feet Assistive device: 1 person hand held assist Gait Pattern/deviations: Step-through pattern;Trunk flexed;Antalgic;Drifts right/left Gait velocity: decreased   General Gait Details:  slow and antalgic gait- reports he has chronic back pain and it is flaring up now. Steady with U HHA, feel he would do well with RW. No dysnea, increased WOB, or other signs of desat with activity  Stairs            Wheelchair Mobility    Modified Rankin (Stroke Patients Only)       Balance Overall balance assessment: Mild deficits observed, not formally tested                                           Pertinent Vitals/Pain Pain Assessment: No/denies pain    Home Living Family/patient expects to be discharged to:: Private residence Living Arrangements: Children Available Help at Discharge: Family;Available PRN/intermittently Type of Home: House Home Access: Stairs to enter Entrance Stairs-Rails: Right;Left;Can reach both Entrance Stairs-Number of Steps: 5 Home Layout: One level Home Equipment: Walker - 2 wheels;Grab bars - tub/shower;Grab bars - toilet Additional Comments: hx of remote fall when R knee buckled going up the steps    Prior Function Level of Independence: Independent         Comments: still driving, manging meds, cooking     Hand Dominance        Extremity/Trunk Assessment   Upper Extremity Assessment Upper Extremity Assessment: Overall WFL for tasks assessed    Lower Extremity Assessment Lower Extremity Assessment: Overall WFL for tasks assessed    Cervical / Trunk Assessment Cervical / Trunk  Assessment: Kyphotic  Communication   Communication: HOH  Cognition Arousal/Alertness: Awake/alert Behavior During Therapy: WFL for tasks assessed/performed Overall Cognitive Status: Within Functional Limits for tasks assessed                                        General Comments      Exercises     Assessment/Plan    PT Assessment Patient needs continued PT services  PT Problem List Decreased strength;Decreased coordination;Decreased balance;Pain       PT Treatment Interventions DME  instruction;Balance training;Gait training;Stair training;Functional mobility training;Patient/family education;Therapeutic activities;Therapeutic exercise    PT Goals (Current goals can be found in the Care Plan section)  Acute Rehab PT Goals Patient Stated Goal: go home today PT Goal Formulation: With patient Time For Goal Achievement: 10/01/20 Potential to Achieve Goals: Good    Frequency Min 3X/week   Barriers to discharge        Co-evaluation               AM-PAC PT "6 Clicks" Mobility  Outcome Measure Help needed turning from your back to your side while in a flat bed without using bedrails?: None Help needed moving from lying on your back to sitting on the side of a flat bed without using bedrails?: None Help needed moving to and from a bed to a chair (including a wheelchair)?: A Little Help needed standing up from a chair using your arms (e.g., wheelchair or bedside chair)?: A Little Help needed to walk in hospital room?: A Little Help needed climbing 3-5 steps with a railing? : A Little 6 Click Score: 20    End of Session   Activity Tolerance: Patient tolerated treatment well Patient left: in bed;with call bell/phone within reach (no recliner available in room) Nurse Communication: Mobility status PT Visit Diagnosis: Muscle weakness (generalized) (M62.81);Pain Pain - Right/Left:  (back) Pain - part of body:  (back)    Time: 7680-8811 PT Time Calculation (min) (ACUTE ONLY): 21 min   Charges:   PT Evaluation $PT Eval Moderate Complexity: 1 Mod         Windell Norfolk, DPT, PN1   Supplemental Physical Therapist [place]    Pager [phone] Acute Rehab Office [phone]

## 2020-09-17 NOTE — Plan of Care (Signed)
  Problem: Clinical Measurements: Goal: Diagnostic test results will improve Outcome: Progressing Goal: Respiratory complications will improve Outcome: Progressing   Problem: Fluid Volume: Goal: Compliance with measures to maintain balanced fluid volume will improve Outcome: Progressing   Problem: Nutritional: Goal: Ability to make healthy dietary choices will improve Outcome: Progressing

## 2020-09-17 NOTE — Care Management Important Message (Signed)
Important Message  Patient Details  Name: Ricky Harris MRN: [id number] Date of Birth: 01-12-1941   Medicare Important Message Given:  Yes     [name] 09/17/2020, 2:33 PM

## 2020-09-17 NOTE — Progress Notes (Signed)
Dialysis Nurse Note:  Notified by telemetry that patient was in A-Fib.  Candiss Norse MD notified.  Order received to contact Bedside RN and have him contact the attending.

## 2020-09-17 NOTE — Progress Notes (Addendum)
Overnight floor coverage progress note  Notified by RN that patient had an episode of A. fib tonight while being transported back to his room from dialysis, now converted back to sinus rhythm.  No documented history of A. fib.  Patient seen at bedside.  Resting comfortably.  States he felt his heart racing while at dialysis but feeling better now.  Denies lightheadedness/dizziness, chest pain, or shortness of breath.  Denies history of A. fib.  Currently in sinus rhythm but review of telemetry showing that he was in A. fib earlier with rate in the 140s. Stat EKG done and showing new ST elevations in V2 and V3. I spoke to on-call cardiologist Dr. Toney Rakes who also came to review telemetry and has reviewed the EKG.  -Stat troponin -Stat potassium and magnesium levels -Check TSH level -Further management per cardiology recommendations. -Discharge order canceled  Addendum: Dr. Toney Rakes discussed the case with on-call interventional cardiologist as well, STEMI felt to be less likely in the absence of anginal symptoms.  No plan for urgent cardiac catheterization tonight but will be done in the morning.  Echo done during this hospitalization showing EF 25 to 30%.  Cardiology recommending medical management at this time and started the patient on aspirin, metoprolol, and IV heparin.

## 2020-09-18 ENCOUNTER — Encounter (HOSPITAL_COMMUNITY): Payer: Self-pay | Admitting: Family Medicine

## 2020-09-18 ENCOUNTER — Encounter (HOSPITAL_COMMUNITY)
Admission: EM | Disposition: A | Discharge status: Home / Self Care | Payer: Self-pay | Source: Home / Self Care | Attending: Family Medicine

## 2020-09-18 DIAGNOSIS — I4891 Unspecified atrial fibrillation: Secondary | ICD-10-CM

## 2020-09-18 DIAGNOSIS — I214 Non-ST elevation (NSTEMI) myocardial infarction: Secondary | ICD-10-CM

## 2020-09-18 DIAGNOSIS — I2510 Atherosclerotic heart disease of native coronary artery without angina pectoris: Secondary | ICD-10-CM

## 2020-09-18 HISTORY — PX: RIGHT AND LEFT HEART CATH: CATH118262

## 2020-09-18 HISTORY — DX: Unspecified atrial fibrillation: I48.91

## 2020-09-18 LAB — CBC
HCT: 31.8 % — ABNORMAL LOW (ref 39.0–52.0)
Hemoglobin: 10.2 g/dL — ABNORMAL LOW (ref 13.0–17.0)
MCH: 33.4 pg (ref 26.0–34.0)
MCHC: 32.1 g/dL (ref 30.0–36.0)
MCV: 104.3 fL — ABNORMAL HIGH (ref 80.0–100.0)
Platelets: 87 10*3/uL — ABNORMAL LOW (ref 150–400)
RBC: 3.05 MIL/uL — ABNORMAL LOW (ref 4.22–5.81)
RDW: 16.2 % — ABNORMAL HIGH (ref 11.5–15.5)
WBC: 5 10*3/uL (ref 4.0–10.5)
nRBC: 0 % (ref 0.0–0.2)

## 2020-09-18 LAB — BASIC METABOLIC PANEL
Anion gap: 11 (ref 5–15)
BUN: 12 mg/dL (ref 8–23)
CO2: 28 mmol/L (ref 22–32)
Calcium: 8.2 mg/dL — ABNORMAL LOW (ref 8.9–10.3)
Chloride: 99 mmol/L (ref 98–111)
Creatinine, Ser: 3.54 mg/dL — ABNORMAL HIGH (ref 0.61–1.24)
GFR, Estimated: 17 mL/min — ABNORMAL LOW (ref >60)
Glucose, Bld: 141 mg/dL — ABNORMAL HIGH (ref 70–99)
Potassium: 3.3 mmol/L — ABNORMAL LOW (ref 3.5–5.1)
Sodium: 138 mmol/L (ref 135–145)

## 2020-09-18 LAB — POCT I-STAT EG7
Acid-Base Excess: 4 mmol/L — ABNORMAL HIGH (ref 0.0–2.0)
Acid-Base Excess: 4 mmol/L — ABNORMAL HIGH (ref 0.0–2.0)
Bicarbonate: 28.7 mmol/L — ABNORMAL HIGH (ref 20.0–28.0)
Bicarbonate: 28.9 mmol/L — ABNORMAL HIGH (ref 20.0–28.0)
Calcium, Ion: 1.07 mmol/L — ABNORMAL LOW (ref 1.15–1.40)
Calcium, Ion: 1.08 mmol/L — ABNORMAL LOW (ref 1.15–1.40)
HCT: 29 % — ABNORMAL LOW (ref 39.0–52.0)
HCT: 30 % — ABNORMAL LOW (ref 39.0–52.0)
Hemoglobin: 10.2 g/dL — ABNORMAL LOW (ref 13.0–17.0)
Hemoglobin: 9.9 g/dL — ABNORMAL LOW (ref 13.0–17.0)
O2 Saturation: 67 %
O2 Saturation: 69 %
Potassium: 3.9 mmol/L (ref 3.5–5.1)
Potassium: 3.9 mmol/L (ref 3.5–5.1)
Sodium: 138 mmol/L (ref 135–145)
Sodium: 139 mmol/L (ref 135–145)
TCO2: 30 mmol/L (ref 22–32)
TCO2: 30 mmol/L (ref 22–32)
pCO2, Ven: 40.8 mmHg — ABNORMAL LOW (ref 44.0–60.0)
pCO2, Ven: 41.9 mmHg — ABNORMAL LOW (ref 44.0–60.0)
pH, Ven: 7.447 — ABNORMAL HIGH (ref 7.250–7.430)
pH, Ven: 7.455 — ABNORMAL HIGH (ref 7.250–7.430)
pO2, Ven: 33 mmHg (ref 32.0–45.0)
pO2, Ven: 35 mmHg (ref 32.0–45.0)

## 2020-09-18 LAB — TROPONIN I (HIGH SENSITIVITY)
Troponin I (High Sensitivity): 103 ng/L (ref <18)
Troponin I (High Sensitivity): 110 ng/L (ref <18)

## 2020-09-18 LAB — HEPARIN LEVEL (UNFRACTIONATED): Heparin Unfractionated: 0.3 IU/mL (ref 0.30–0.70)

## 2020-09-18 LAB — POCT I-STAT 7, (LYTES, BLD GAS, ICA,H+H)
Acid-Base Excess: 4 mmol/L — ABNORMAL HIGH (ref 0.0–2.0)
Bicarbonate: 28.2 mmol/L — ABNORMAL HIGH (ref 20.0–28.0)
Calcium, Ion: 1.02 mmol/L — ABNORMAL LOW (ref 1.15–1.40)
HCT: 29 % — ABNORMAL LOW (ref 39.0–52.0)
Hemoglobin: 9.9 g/dL — ABNORMAL LOW (ref 13.0–17.0)
O2 Saturation: 95 %
Potassium: 3.8 mmol/L (ref 3.5–5.1)
Sodium: 139 mmol/L (ref 135–145)
TCO2: 29 mmol/L (ref 22–32)
pCO2 arterial: 37.8 mmHg (ref 32.0–48.0)
pH, Arterial: 7.48 — ABNORMAL HIGH (ref 7.350–7.450)
pO2, Arterial: 71 mmHg — ABNORMAL LOW (ref 83.0–108.0)

## 2020-09-18 LAB — MAGNESIUM: Magnesium: 1.8 mg/dL (ref 1.7–2.4)

## 2020-09-18 LAB — TSH: TSH: 4.65 u[IU]/mL — ABNORMAL HIGH (ref 0.350–4.500)

## 2020-09-18 SURGERY — RIGHT AND LEFT HEART CATH
Anesthesia: LOCAL

## 2020-09-18 MED ORDER — HEPARIN (PORCINE) 25000 UT/250ML-% IV SOLN
1500.0000 [IU]/h | INTRAVENOUS | Status: DC
Start: 2020-09-19 — End: 2020-09-22
  Administered 2020-09-19: 1400 [IU]/h via INTRAVENOUS
  Administered 2020-09-19: 1150 [IU]/h via INTRAVENOUS
  Administered 2020-09-20 – 2020-09-22 (×3): 1500 [IU]/h via INTRAVENOUS
  Filled 2020-09-18 (×4): qty 250

## 2020-09-18 MED ORDER — SODIUM CHLORIDE 0.9% FLUSH: 3.0000 mL | INTRAVENOUS | Status: DC | PRN

## 2020-09-18 MED ORDER — ASPIRIN EC 81 MG PO TBEC
81.0000 mg | DELAYED_RELEASE_TABLET | Freq: Every day | ORAL | Status: DC
  Administered 2020-09-18: 81 mg via ORAL
  Filled 2020-09-18: qty 1

## 2020-09-18 MED ORDER — HEPARIN (PORCINE) 25000 UT/250ML-% IV SOLN
1100.0000 [IU]/h | INTRAVENOUS | Status: DC
  Administered 2020-09-18: 1100 [IU]/h via INTRAVENOUS
  Filled 2020-09-18: qty 250

## 2020-09-18 MED ORDER — ATORVASTATIN CALCIUM 80 MG PO TABS
80.0000 mg | ORAL_TABLET | Freq: Every day | ORAL | Status: DC
  Administered 2020-09-18: 80 mg via ORAL
  Filled 2020-09-18: qty 1

## 2020-09-18 MED ORDER — SODIUM CHLORIDE 0.9 % IV SOLN: 250.0000 mL | INTRAVENOUS | Status: DC | PRN

## 2020-09-18 MED ORDER — SODIUM CHLORIDE 0.9 % IV SOLN: INTRAVENOUS | Status: DC

## 2020-09-18 MED ORDER — ATORVASTATIN CALCIUM 80 MG PO TABS
80.0000 mg | ORAL_TABLET | Freq: Every day | ORAL | Status: DC
  Administered 2020-09-18 – 2020-09-22 (×5): 80 mg via ORAL
  Filled 2020-09-18 (×5): qty 1

## 2020-09-18 MED ORDER — LIDOCAINE HCL (PF) 1 % IJ SOLN
INTRAMUSCULAR | Status: DC | PRN
  Administered 2020-09-18 (×2): 10 mL
  Administered 2020-09-18: 15 mL

## 2020-09-18 MED ORDER — METOPROLOL SUCCINATE ER 25 MG PO TB24
25.0000 mg | ORAL_TABLET | Freq: Every day | ORAL | Status: DC
  Administered 2020-09-18 – 2020-09-23 (×7): 25 mg via ORAL
  Filled 2020-09-18 (×7): qty 1

## 2020-09-18 MED ORDER — ACETAMINOPHEN 325 MG PO TABS
650.0000 mg | ORAL_TABLET | ORAL | Status: DC | PRN
  Administered 2020-09-19: 650 mg via ORAL
  Filled 2020-09-18: qty 2

## 2020-09-18 MED ORDER — ASPIRIN EC 81 MG PO TBEC
81.0000 mg | DELAYED_RELEASE_TABLET | Freq: Every day | ORAL | Status: DC
  Administered 2020-09-19 – 2020-09-23 (×5): 81 mg via ORAL
  Filled 2020-09-18 (×5): qty 1

## 2020-09-18 MED ORDER — MIDAZOLAM HCL 2 MG/2ML IJ SOLN
INTRAMUSCULAR | Status: AC
  Filled 2020-09-18: qty 2

## 2020-09-18 MED ORDER — LIDOCAINE HCL (PF) 1 % IJ SOLN
INTRAMUSCULAR | Status: AC
  Filled 2020-09-18: qty 30

## 2020-09-18 MED ORDER — ASPIRIN 81 MG PO CHEW: 81.0000 mg | CHEWABLE_TABLET | Freq: Every day | ORAL | Status: DC

## 2020-09-18 MED ORDER — ISOSORBIDE MONONITRATE ER 30 MG PO TB24
15.0000 mg | ORAL_TABLET | Freq: Every day | ORAL | Status: DC
  Administered 2020-09-19 – 2020-09-23 (×5): 15 mg via ORAL
  Filled 2020-09-18 (×5): qty 1

## 2020-09-18 MED ORDER — LABETALOL HCL 5 MG/ML IV SOLN: 10.0000 mg | INTRAVENOUS | Status: AC | PRN

## 2020-09-18 MED ORDER — HYDRALAZINE HCL 20 MG/ML IJ SOLN: 10.0000 mg | INTRAMUSCULAR | Status: AC | PRN

## 2020-09-18 MED ORDER — HEPARIN (PORCINE) IN NACL 1000-0.9 UT/500ML-% IV SOLN
INTRAVENOUS | Status: DC | PRN
  Administered 2020-09-18 (×2): 500 mL

## 2020-09-18 MED ORDER — FENTANYL CITRATE (PF) 100 MCG/2ML IJ SOLN
INTRAMUSCULAR | Status: AC
  Filled 2020-09-18: qty 2

## 2020-09-18 MED ORDER — ASPIRIN EC 81 MG PO TBEC: 81.0000 mg | DELAYED_RELEASE_TABLET | Freq: Every day | ORAL | Status: DC

## 2020-09-18 MED ORDER — SODIUM CHLORIDE 0.9% FLUSH
3.0000 mL | Freq: Two times a day (BID) | INTRAVENOUS | Status: DC
  Administered 2020-09-18 – 2020-09-23 (×8): 3 mL via INTRAVENOUS

## 2020-09-18 MED ORDER — FENTANYL CITRATE (PF) 100 MCG/2ML IJ SOLN
INTRAMUSCULAR | Status: DC | PRN
  Administered 2020-09-18: 25 ug via INTRAVENOUS

## 2020-09-18 MED ORDER — DIAZEPAM 5 MG PO TABS: 5.0000 mg | ORAL_TABLET | Freq: Three times a day (TID) | ORAL | Status: DC | PRN

## 2020-09-18 MED ORDER — SODIUM CHLORIDE 0.9% FLUSH
3.0000 mL | Freq: Two times a day (BID) | INTRAVENOUS | Status: DC
  Administered 2020-09-18 – 2020-09-23 (×7): 3 mL via INTRAVENOUS

## 2020-09-18 MED ORDER — MIDAZOLAM HCL 2 MG/2ML IJ SOLN
INTRAMUSCULAR | Status: DC | PRN
  Administered 2020-09-18 (×2): 1 mg via INTRAVENOUS

## 2020-09-18 MED ORDER — IOHEXOL 350 MG/ML SOLN
INTRAVENOUS | Status: DC | PRN
  Administered 2020-09-18: 70 mL

## 2020-09-18 MED ORDER — ASPIRIN 325 MG PO TABS
325.0000 mg | ORAL_TABLET | Freq: Every day | ORAL | Status: DC
  Administered 2020-09-18: 325 mg via ORAL
  Filled 2020-09-18: qty 1

## 2020-09-18 MED ORDER — ONDANSETRON HCL 4 MG/2ML IJ SOLN: 4.0000 mg | Freq: Four times a day (QID) | INTRAMUSCULAR | Status: DC | PRN

## 2020-09-18 SURGICAL SUPPLY — 13 items
CATH INFINITI 5FR JL5 (CATHETERS) ×2 IMPLANT
CATH INFINITI 5FR MULTPACK ANG (CATHETERS) ×2 IMPLANT
CATH SWAN GANZ 7F STRAIGHT (CATHETERS) ×2 IMPLANT
CLOSURE MYNX CONTROL 5F (Vascular Products) ×2 IMPLANT
KIT HEART LEFT (KITS) ×2 IMPLANT
PACK CARDIAC CATHETERIZATION (CUSTOM PROCEDURE TRAY) ×2 IMPLANT
SHEATH PINNACLE 5F 10CM (SHEATH) ×2 IMPLANT
SHEATH PINNACLE 7F 10CM (SHEATH) ×2 IMPLANT
SHEATH PROBE COVER 6X72 (BAG) ×2 IMPLANT
TRANSDUCER W/STOPCOCK (MISCELLANEOUS) ×2 IMPLANT
TUBING CIL FLEX 10 FLL-RA (TUBING) ×2 IMPLANT
WIRE EMERALD 3MM-J .025X260CM (WIRE) ×2 IMPLANT
WIRE EMERALD 3MM-J .035X150CM (WIRE) ×2 IMPLANT

## 2020-09-18 NOTE — Interval H&P Note (Signed)
Cath Lab Visit (complete for each Cath Lab visit)  Clinical Evaluation Leading to the Procedure:   ACS: No.  Non-ACS:    Anginal Classification: CCS I  Anti-ischemic medical therapy: Minimal Therapy (1 class of medications)  Non-Invasive Test Results: No non-invasive testing performed  Prior CABG: No previous CABG      History and Physical Interval Note:  09/18/2020 3:36 PM  Ricky Harris  has presented today for surgery, with the diagnosis of nstemi.  The various methods of treatment have been discussed with the patient and family. After consideration of risks, benefits and other options for treatment, the patient has consented to  Procedure(s): LEFT HEART CATH AND CORONARY ANGIOGRAPHY (N/A) as a surgical intervention.  The patient's history has been reviewed, patient examined, no change in status, stable for surgery.  I have reviewed the patient's chart and labs.  Questions were answered to the patient's satisfaction.     Shelva Majestic

## 2020-09-18 NOTE — Plan of Care (Signed)
  Problem: Education: Goal: Knowledge of disease and its progression will improve Outcome: Progressing   Problem: Nutritional: Goal: Ability to make healthy dietary choices will improve Outcome: Progressing   Problem: Clinical Measurements: Goal: Complications related to the disease process, condition or treatment will be avoided or minimized Outcome: Progressing   

## 2020-09-18 NOTE — H&P (View-Only) (Signed)
Progress Note  Patient Name: Ricky Harris Date of Encounter: 09/18/2020  [facility] Cardiologist: None new  Subjective   Feels well this am. No chest pain or palpitations. No dyspnea.   Inpatient Medications    Scheduled Meds:  aspirin EC  81 mg Oral Daily   atorvastatin  80 mg Oral Daily   Chlorhexidine Gluconate Cloth  6 each Topical Q0600   lidocaine  1 patch Transdermal Q24H   metoprolol succinate  25 mg Oral Daily   Continuous Infusions:  sodium chloride     sodium chloride     sodium chloride     ampicillin-sulbactam (UNASYN) IV 200 mL/hr at 09/17/20 1557   heparin 1,100 Units/hr (09/18/20 0228)   PRN Meds: sodium chloride, sodium chloride, sodium chloride, acetaminophen, alteplase, fentaNYL (SUBLIMAZE) injection, heparin, lidocaine (PF), lidocaine-prilocaine, ondansetron (ZOFRAN) IV, pentafluoroprop-tetrafluoroeth   Vital Signs    Vitals:   09/17/20 2307 09/18/20 0101 09/18/20 0256 09/18/20 0510  BP: 105/65 (!) 100/58 106/64 105/65  Pulse: (!) 103 (!) 101 96 88  Resp: 18 20 16 16   Temp: 98.4 F (36.9 C) 98.2 F (36.8 C) 98.8 F (37.1 C) 98.7 F (37.1 C)  TempSrc: Oral Oral Oral Oral  SpO2: 97% 95% 92% 94%  Weight:    76.9 kg  Height:        Intake/Output Summary (Last 24 hours) at 09/18/2020 0818 Last data filed at 09/18/2020 0800 Gross per 24 hour  Intake 553.06 ml  Output 1100 ml  Net -546.94 ml   Last 3 Weights 09/18/2020 09/17/2020 09/17/2020  Weight (lbs) 169 lb 8.5 oz 169 lb 8.5 oz 174 lb 6.1 oz  Weight (kg) 76.9 kg 76.9 kg 79.1 kg      Telemetry    NSR. Was in Afib with RVR but converted at 2300 last night - Personally Reviewed  ECG    NSR with LVH, old anteroseptal infarct. - Personally Reviewed  Physical Exam   GEN: thin, elderly WM No acute distress.   Neck: No JVD Cardiac: RRR, no murmurs, rubs, or gallops.  Respiratory: Clear to auscultation bilaterally. GI: Soft, nontender, non-distended. Has some type of prosthesis in  right groin. MS: No edema; No deformity. AV fistula left upper arm. Pulses 2+= Neuro:  Nonfocal  Psych: Normal affect   Labs    High Sensitivity Troponin:   Recent Labs  Lab 09/15/20 1526 09/15/20 1806 09/18/20 0216 09/18/20 0458  TROPONINIHS 96* 87* 103* 110*      Chemistry Recent Labs  Lab 09/15/20 1526 09/15/20 2025 09/16/20 0335 09/17/20 0319 09/18/20 0107  NA 140 137 135 137 138  K 3.2* 3.4* 3.7 3.4* 3.3*  CL 101 99 99 99 99  CO2 27 24 25 28 28   GLUCOSE 82 90 81 99 141*  BUN 29* 31* 34* 18 12  CREATININE 7.10* 7.61* 7.93* 4.86* 3.54*  CALCIUM 8.3* 8.0* 8.0* 8.0* 8.2*  PROT 6.4*  --   --   --   --   ALBUMIN 3.4* 2.8*  --   --   --   AST 25  --   --   --   --   ALT 14  --   --   --   --   ALKPHOS 108  --   --   --   --   BILITOT 1.0  --   --   --   --   GFRNONAA 7* 7* 6* 12* 17*  ANIONGAP 12 14 11  10  11     Hematology Recent Labs  Lab 09/16/20 0335 09/17/20 0319 09/18/20 0107  WBC 7.3 6.9 5.0  RBC 2.88* 3.07* 3.05*  HGB 9.8* 10.3* 10.2*  HCT 30.9* 32.1* 31.8*  MCV 107.3* 104.6* 104.3*  MCH 34.0 33.6 33.4  MCHC 31.7 32.1 32.1  RDW 16.8* 16.7* 16.2*  PLT 87* 88* 87*    BNP Recent Labs  Lab 09/15/20 1618  BNP >4,500.0*     DDimer No results for input(s): DDIMER in the last 168 hours.   Radiology    ECHOCARDIOGRAM COMPLETE  Result Date: 09/16/2020    ECHOCARDIOGRAM REPORT   Patient Name:   YEISON SIPPEL Date of Exam: 09/16/2020 Medical Rec #:  [id number]      Height:       72.0 in Accession #:    [id number]     Weight:       169.8 lb Date of Birth:  1940-09-25     BSA:          1.987 m Patient Age:    80 years       BP:           118/59 mmHg Patient Gender: M              HR:           96 bpm. Exam Location:  Inpatient Procedure: 2D Echo Indications:    acute diastolic chf  History:        Patient has prior history of Echocardiogram examinations, most                 recent 09/24/2019. Abdominal aortic aneurysm, Chronic kidney                  disease. Cirrhosis, Signs/Symptoms:elevated troponin; Risk                 Factors:Hypertension.  Sonographer:    Johny Chess Referring Phys: [id number] [place]  1. Left ventricular ejection fraction, by estimation, is 25 to 30%. The left ventricle has severely decreased function. The left ventricle demonstrates global hypokinesis. Left ventricular diastolic parameters are consistent with Grade I diastolic dysfunction (impaired relaxation).  2. Right ventricular systolic function is mildly reduced. The right ventricular size is normal. There is normal pulmonary artery systolic pressure. The estimated right ventricular systolic pressure is 14.4 mmHg.  3. Left atrial size was moderately dilated.  4. Right atrial size was mildly dilated.  5. The mitral valve is normal in structure. Trivial mitral valve regurgitation. No evidence of mitral stenosis. Moderate mitral annular calcification.  6. The aortic valve is tricuspid. Aortic valve regurgitation is not visualized. Mild to moderate aortic valve sclerosis/calcification is present, without any evidence of aortic stenosis.  7. Aortic dilatation noted. There is mild dilatation of the ascending aorta, measuring 40 mm.  8. The inferior vena cava is dilated in size with >50% respiratory variability, suggesting right atrial pressure of 8 mmHg. FINDINGS  Left Ventricle: Left ventricular ejection fraction, by estimation, is 25 to 30%. The left ventricle has severely decreased function. The left ventricle demonstrates global hypokinesis. The left ventricular internal cavity size was normal in size. There is no left ventricular hypertrophy. Left ventricular diastolic parameters are consistent with Grade I diastolic dysfunction (impaired relaxation). Right Ventricle: The right ventricular size is normal. No increase in right ventricular wall thickness. Right ventricular systolic function is mildly reduced. There is normal pulmonary artery systolic pressure.  The tricuspid regurgitant  velocity is 1.61 m/s, and with an assumed right atrial pressure of 8 mmHg, the estimated right ventricular systolic pressure is 03.5 mmHg. Left Atrium: Left atrial size was moderately dilated. Right Atrium: Right atrial size was mildly dilated. Pericardium: There is no evidence of pericardial effusion. Mitral Valve: The mitral valve is normal in structure. Moderate mitral annular calcification. Trivial mitral valve regurgitation. No evidence of mitral valve stenosis. Tricuspid Valve: The tricuspid valve is normal in structure. Tricuspid valve regurgitation is trivial. Aortic Valve: The aortic valve is tricuspid. Aortic valve regurgitation is not visualized. Mild to moderate aortic valve sclerosis/calcification is present, without any evidence of aortic stenosis. Pulmonic Valve: The pulmonic valve was normal in structure. Pulmonic valve regurgitation is not visualized. Aorta: The aortic root is normal in size and structure and aortic dilatation noted. There is mild dilatation of the ascending aorta, measuring 40 mm. Venous: The inferior vena cava is dilated in size with greater than 50% respiratory variability, suggesting right atrial pressure of 8 mmHg. IAS/Shunts: No atrial level shunt detected by color flow Doppler.  LEFT VENTRICLE PLAX 2D LVIDd:         5.60 cm      Diastology LVIDs:         4.40 cm      LV e' medial:    5.00 cm/s LV PW:         1.00 cm      LV E/e' medial:  19.5 LV IVS:        0.90 cm      LV e' lateral:   10.10 cm/s LVOT diam:     2.20 cm      LV E/e' lateral: 9.7 LV SV:         92 LV SV Index:   46 LVOT Area:     3.80 cm  LV Volumes (MOD) LV vol d, MOD A2C: 154.0 ml LV vol d, MOD A4C: 199.0 ml LV vol s, MOD A2C: 107.0 ml LV vol s, MOD A4C: 137.0 ml LV SV MOD A2C:     47.0 ml LV SV MOD A4C:     199.0 ml LV SV MOD BP:      60.9 ml RIGHT VENTRICLE             IVC RV S prime:     13.30 cm/s  IVC diam: 2.40 cm TAPSE (M-mode): 2.4 cm LEFT ATRIUM              Index        RIGHT ATRIUM           Index LA diam:        5.10 cm  2.57 cm/m  RA Area:     21.90 cm LA Vol (A2C):   101.0 ml 50.83 ml/m RA Volume:   66.60 ml  33.52 ml/m LA Vol (A4C):   93.2 ml  46.90 ml/m LA Biplane Vol: 97.0 ml  48.81 ml/m  AORTIC VALVE LVOT Vmax:   125.00 cm/s LVOT Vmean:  85.200 cm/s LVOT VTI:    0.241 m  AORTA Ao Root diam: 3.50 cm Ao Asc diam:  4.00 cm MITRAL VALVE                TRICUSPID VALVE MV Area (PHT): 4.96 cm     TR Peak grad:   10.4 mmHg MV Decel Time: 153 msec     TR Vmax:        161.00 cm/s MV E velocity: 97.50  cm/s MV A velocity: 112.00 cm/s  SHUNTS MV E/A ratio:  0.87         Systemic VTI:  0.24 m                             Systemic Diam: 2.20 cm Loralie Champagne MD Electronically signed by Loralie Champagne MD Signature Date/Time: 09/16/2020/5:29:59 PM    Final     Cardiac Studies   ECHO: 09/16/20 IMPRESSIONS     1. Left ventricular ejection fraction, by estimation, is 25 to 30%. The  left ventricle has severely decreased function. The left ventricle  demonstrates global hypokinesis. Left ventricular diastolic parameters are  consistent with Grade I diastolic  dysfunction (impaired relaxation).   2. Right ventricular systolic function is mildly reduced. The right  ventricular size is normal. There is normal pulmonary artery systolic  pressure. The estimated right ventricular systolic pressure is 10.1 mmHg.   3. Left atrial size was moderately dilated.   4. Right atrial size was mildly dilated.   5. The mitral valve is normal in structure. Trivial mitral valve  regurgitation. No evidence of mitral stenosis. Moderate mitral annular  calcification.   6. The aortic valve is tricuspid. Aortic valve regurgitation is not  visualized. Mild to moderate aortic valve sclerosis/calcification is  present, without any evidence of aortic stenosis.   7. Aortic dilatation noted. There is mild dilatation of the ascending  aorta, measuring 40 mm.   8. The inferior vena cava is dilated  in size with >50% respiratory  variability, suggesting right atrial pressure of 8 mmHg.  Patient Profile     80 y.o. 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    Assessment & Plan    Atrial fibrillation with RVR. Now converted to NSR. First episode documented. [place] vasc score is 5. Currently on Toprol XL. On ASA. Will need to evaluate ischemic risk. Consider anticoagulation with Eliquis but will need to keep in mind high bleeding risk with history of ESRD, cirrhosis and multiple esophageal banding procedures.  Acute systolic CHF. Severe LV and RV dysfunction noted on Echo. EF 25-30% which is new compared to Echo one year ago. Appears euvolemic with dialysis. Recommend right and left heart cath today to assess hemodynamics and ischemic risk. The procedure and risks were reviewed including but not limited to death, myocardial infarction, stroke, arrythmias, bleeding, transfusion, emergency surgery, dye allergy, or renal dysfunction. The patient voices understanding and is agreeable to proceed. Chest pain. Troponin negative. High risk for CAD. Will assess with cath today ESRD on HD. HTN HLD. Recommend high dose statin. S/p stent graft for AAA History of cirrhosis. 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       For questions or updates, please contact [place] Please consult [email or web address] for contact info under        Signed, Sevin Langenbach [place], MD  09/18/2020, 8:18 AM

## 2020-09-18 NOTE — Progress Notes (Signed)
ANTICOAGULATION CONSULT NOTE  Pharmacy Consult for heparin Indication:  Afib and r/o ACS  Allergies  Allergen Reactions   Cephalexin Hives   Hydralazine Hives    Patient Measurements: Height: 6' (182.9 cm) Weight: 76.9 kg (169 lb 8.5 oz) IBW/kg (Calculated) : 77.6  Vital Signs: Temp: 98.1 F (36.7 C) (07/22 0910) Temp Source: Oral (07/22 0910) BP: 101/63 (07/22 0910) Pulse Rate: 84 (07/22 0910)  Labs: Recent Labs    09/15/20 1526 09/15/20 1806 09/15/20 2025 09/16/20 0335 09/17/20 0319 09/18/20 0107 09/18/20 0216 09/18/20 0458 09/18/20 1055  HGB 10.2*  --    < > 9.8* 10.3* 10.2*  --   --   --   HCT 33.2*  --    < > 30.9* 32.1* 31.8*  --   --   --   PLT 90*  --    < > 87* 88* 87*  --   --   --   LABPROT 15.3*  --   --   --   --   --   --   --   --   INR 1.2  --   --   --   --   --   --   --   --   HEPARINUNFRC  --   --   --   --   --   --   --   --  0.30  CREATININE 7.10*  --    < > 7.93* 4.86* 3.54*  --   --   --   TROPONINIHS 96* 87*  --   --   --   --  103* 110*  --    < > = values in this interval not displayed.     Estimated Creatinine Clearance: 18.7 mL/min (A) (by C-G formula based on SCr of 3.54 mg/dL (H)).   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   09/15/2020   atorvastatin (LIPITOR) 10 MG tablet Take 10 mg by mouth daily.   09/15/2020   carvedilol (COREG) 12.5 MG tablet Take 12.5 mg by mouth 2 (two) times daily.   09/15/2020 at 0900   febuxostat (ULORIC) 40 MG tablet Take 1 tablet (40 mg total) by mouth daily. 30 tablet 3 Past Month   lactulose (CHRONULAC) 10 GM/15ML solution Take 15 mLs (10 g total) by mouth daily.  236 mL 1 Past Month   multivitamin (RENA-VIT) TABS tablet Take 1 tablet by mouth daily. 30 tablet 0 09/15/2020   calcitRIOL (ROCALTROL) 0.25 MCG capsule Take 1 capsule (0.25 mcg total) by mouth every Tuesday, Thursday, and Saturday at 6 PM. (Patient not taking: No sig reported) 30 capsule 3 Not Taking   calcium acetate (PHOSLO) 667 MG capsule Take 1 capsule (667 mg total) by mouth 3 (three) times daily with meals. 90 capsule 2    cloNIDine (CATAPRES) 0.1 MG tablet Take 1 tablet (0.1 mg total) by mouth daily. (Patient not taking: No sig reported) 30 tablet 0 Not Taking   pantoprazole (PROTONIX) 40 MG tablet Take 1 tablet (40 mg total) by mouth daily. 30 tablet 1    traMADol (ULTRAM) 50 MG tablet Take 1 tablet (50 mg total) by mouth every 8 (eight) hours as needed for moderate pain. (Patient not taking: Reported on 09/17/2020) 8 tablet 0 Not Taking   Scheduled:   aspirin EC  81 mg Oral Daily   atorvastatin  80 mg Oral Daily   Chlorhexidine Gluconate Cloth  6 each Topical Q0600   lidocaine  1 patch Transdermal Q24H   metoprolol succinate  25 mg Oral Daily   sodium chloride flush  3 mL Intravenous Q12H   Infusions:   sodium chloride     sodium chloride     sodium chloride     sodium chloride     [START ON 09/19/2020] sodium chloride     ampicillin-sulbactam (UNASYN) IV 200 mL/hr at 09/17/20 1557   heparin 1,100 Units/hr (09/18/20 0228)    Assessment: 80yo male admitted for acute on chronic CHF with plan  to discharge soon now w/ episode of Afib overnight, ECG done that revealed new ST elevations though no anginal sx, to begin heparin while awaiting possible cath.  Heparin level therapeutic at 0.3, cath later today so no changes.  Goal of Therapy:  Heparin level 0.3-0.7 units/ml Monitor platelets by anticoagulation protocol: Yes   Plan:  Heparin 1100 units/h F/U after cath  Arrie Senate, PharmD, BCPS, [facility] Clinical Pharmacist [phone] Please check AMION for all [facility] Pharmacy numbers 09/18/2020

## 2020-09-18 NOTE — Consult Note (Addendum)
Cardiology Consultation:   Patient ID: GURNOOR URSUA MRN: [id number]; DOB: [birth year]  Admit date: 09/15/2020 Date of Consult: 09/18/2020  PCP:  [name]., MD   [facility] Providers Cardiologist:  None        Patient Profile:   KATO WIECZOREK is a 80 y.o. 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  History of Present Illness:   HY SWIATEK is a 80 y.o. 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  Per history: patient presented with right sided chest pain, non productive cough, exertional dyspnea. His work up revealed CTA chest/abd/pelvis for PE or aortic dissection but notable for ascending thoracic aortic aneurysm and new moderate pleural effusions bilaterally Trop 96->87. EKG showed q waves in anteroseptal leads, NSR. Nephrology was consulted and patient underwent HD. ECHO was done which showed new LVEF reduction 25-30%, global hypokinesis.  Today evening the patient completed HD and towards the end- noted to have fluttery sensation and on the monitor was found to have Afib RVR, HR in 150s- this resolved on its own. An EKG was ordered which showed q waves in anteroseptal leads (present before) and the ST junction is more pronounced/elevation thus hospitalist called Cardiology  At the time of evaluation- the patient completely denies symptoms, denies chest  pain, heaviness, GERD, epigastric pain. He appears compensated and closer to euvolemia post HD Vitals:  HR 101, BP 100/58   Past Medical History:  Diagnosis Date   A-fib ([place]) 09/18/2020   AAA (abdominal aortic aneurysm) ([place])    5.3cm , magd by vascular Dr Scot Dock ,   Anemia    " slightly"   Aortic stenosis    Arthritis    knees   Ascites    Cancer ([place])    prostate   Chronic kidney disease    stage 4, mgd by Dr Justin Mend    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[facility])    Coronary artery disease    on CT scan   Gout    H/O hypercholesterolemia    Heart murmur    History of falling    recent- see ct chest results of 04/25/2018   Hypertension    Hypothyroidism    Liver cirrhosis ([place])    Prediabetes    pt denies    Renal artery stenosis ([facility])    Thrombocytopenia ([place])    Wears glasses    Wears glasses     Past Surgical History:  Procedure Laterality Date   ABDOMINAL AORTIC ENDOVASCULAR STENT GRAFT N/A 01/21/2019   Procedure: ABDOMINAL AORTIC ENDOVASCULAR STENT GRAFT WITH CO2;  Surgeon: Angelia Mould, MD;  Location: [place];  Service: Vascular;  Laterality: N/A;   ABDOMINAL AORTOGRAM W/LOWER EXTREMITY Bilateral 12/14/2018   Procedure: ABDOMINAL AORTOGRAM W/LOWER EXTREMITY;  Surgeon: Angelia Mould, MD;  Location: [place] CV LAB;  Service: Cardiovascular;  Laterality: Bilateral;   AV  FISTULA PLACEMENT Left 03/12/2019   Procedure: LEFT ARTERIOVENOUS Arteriovenous FISTULA CREATION.;  Surgeon: Angelia Mould, MD;  Location: [facility] OR;  Service: Vascular;  Laterality: Left;   CATARACT EXTRACTION W/ INTRAOCULAR LENS  IMPLANT, BILATERAL     CHOLECYSTECTOMY     ESOPHAGEAL BANDING N/A 08/11/2017   Procedure: ESOPHAGEAL BANDING;  Surgeon: Otis Brace, MD;  Location: [place];  Service: Gastroenterology;  Laterality: N/A;   ESOPHAGEAL BANDING N/A 10/26/2017   Procedure: ESOPHAGEAL BANDING;  Surgeon: Otis Brace, MD;  Location: [facility];  Service:  Gastroenterology;  Laterality: N/A;   ESOPHAGEAL BANDING N/A 12/19/2017   Procedure: ESOPHAGEAL BANDING;  Surgeon: Otis Brace, MD;  Location: [facility];  Service: Gastroenterology;  Laterality: N/A;   ESOPHAGOGASTRODUODENOSCOPY (EGD) WITH PROPOFOL N/A 07/05/2017   Procedure: ESOPHAGOGASTRODUODENOSCOPY (EGD) WITH PROPOFOL;  Surgeon: Otis Brace, MD;  Location: [place];  Service: Gastroenterology;  Laterality: N/A;   ESOPHAGOGASTRODUODENOSCOPY (EGD) WITH PROPOFOL N/A 08/11/2017   Procedure: ESOPHAGOGASTRODUODENOSCOPY (EGD) WITH PROPOFOL;  Surgeon: Otis Brace, MD;  Location: [place];  Service: Gastroenterology;  Laterality: N/A;   ESOPHAGOGASTRODUODENOSCOPY (EGD) WITH PROPOFOL N/A 10/26/2017   Procedure: ESOPHAGOGASTRODUODENOSCOPY (EGD) WITH PROPOFOL;  Surgeon: Otis Brace, MD;  Location: [facility];  Service: Gastroenterology;  Laterality: N/A;   ESOPHAGOGASTRODUODENOSCOPY (EGD) WITH PROPOFOL N/A 12/19/2017   Procedure: ESOPHAGOGASTRODUODENOSCOPY (EGD) WITH PROPOFOL;  Surgeon: Otis Brace, MD;  Location: [facility];  Service: Gastroenterology;  Laterality: N/A;   ESOPHAGOGASTRODUODENOSCOPY (EGD) WITH PROPOFOL N/A 10/29/2018   Procedure: ESOPHAGOGASTRODUODENOSCOPY (EGD) WITH PROPOFOL;  Surgeon: Otis Brace, MD;  Location: [facility];  Service: Gastroenterology;  Laterality: N/A;   ESOPHAGOGASTRODUODENOSCOPY (EGD) WITH PROPOFOL N/A 02/14/2020   Procedure: ESOPHAGOGASTRODUODENOSCOPY (EGD) WITH PROPOFOL;  Surgeon: Otis Brace, MD;  Location: [facility];  Service: Gastroenterology;  Laterality: N/A;   EYE SURGERY     bilateral cataract removal with lens placement   HERNIA REPAIR     IR PARACENTESIS  12/26/2018   IR PARACENTESIS  01/02/2019   IR PARACENTESIS  01/15/2019   IR PARACENTESIS  01/30/2019   IR PARACENTESIS  02/05/2019   IR PARACENTESIS  02/14/2019   IR PARACENTESIS  02/28/2019   IR PARACENTESIS  03/14/2019   IR PARACENTESIS   03/29/2019   IR PARACENTESIS  04/11/2019   IR PARACENTESIS  04/25/2019   IR PARACENTESIS  05/09/2019   IR PARACENTESIS  06/03/2019   IR PARACENTESIS  06/06/2019   IR PARACENTESIS  06/20/2019   IR PARACENTESIS  07/03/2019   IR PARACENTESIS  07/19/2019   IR PARACENTESIS  08/08/2019   IR PARACENTESIS  08/27/2019   IR PARACENTESIS  09/09/2019   IR PARACENTESIS  09/24/2019   IR PARACENTESIS  10/07/2019   IR PARACENTESIS  10/23/2019   IR PARACENTESIS  11/07/2019   IR PARACENTESIS  11/21/2019   IR PARACENTESIS  12/04/2019   IR PARACENTESIS  12/25/2019   IR PARACENTESIS  01/10/2020   IR PARACENTESIS  01/27/2020   IR PARACENTESIS  02/19/2020   IR PARACENTESIS  03/27/2020   PROSTATECTOMY     subtotal gastrectomy       Home Medications:  Prior to Admission medications   Medication Sig Start Date End Date Taking? Authorizing Provider  amLODipine (NORVASC) 5 MG tablet Take 5 mg by mouth daily. 07/29/20  Yes [provider]  amoxicillin-clavulanate (AUGMENTIN) 500-125 MG tablet Take 1 tablet (500 mg total) by mouth 2 (two) times daily for 6 days. 09/17/20 09/23/20 Yes Pahwani, Einar Grad, MD  atorvastatin (LIPITOR) 10 MG tablet Take 10 mg by mouth  daily. 07/16/20  Yes [provider]  carvedilol (COREG) 12.5 MG tablet Take 12.5 mg by mouth 2 (two) times daily. 03/22/17  Yes [provider]  febuxostat (ULORIC) 40 MG tablet Take 1 tablet (40 mg total) by mouth daily. 02/20/20  Yes Rai, Ripudeep K, MD  lactulose (CHRONULAC) 10 GM/15ML solution Take 15 mLs (10 g total) by mouth daily. 02/20/20  Yes Rai, Ripudeep K, MD  multivitamin (RENA-VIT) TABS tablet Take 1 tablet by mouth daily. 09/27/19  Yes Autry-Lott, Naaman Plummer, DO  calcitRIOL (ROCALTROL) 0.25 MCG capsule Take 1 capsule (0.25 mcg total) by mouth every Tuesday, Thursday, and Saturday at 6 PM. Patient not taking: No sig reported 02/20/20   Rai, Ripudeep K, MD  calcium acetate (PHOSLO) 667 MG capsule Take 1 capsule (667 mg total) by mouth 3 (three)  times daily with meals. 02/20/20   Rai, Ripudeep K, MD  cloNIDine (CATAPRES) 0.1 MG tablet Take 1 tablet (0.1 mg total) by mouth daily. Patient not taking: No sig reported 09/28/19   Autry-Lott, Naaman Plummer, DO  pantoprazole (PROTONIX) 40 MG tablet Take 1 tablet (40 mg total) by mouth daily. 02/20/20 04/20/20  Rai, Vernelle Emerald, MD  traMADol (ULTRAM) 50 MG tablet Take 1 tablet (50 mg total) by mouth every 8 (eight) hours as needed for moderate pain. Patient not taking: Reported on 09/17/2020 03/12/19   Gabriel Earing, PA-C    Inpatient Medications: Scheduled Meds:  aspirin  325 mg Oral Daily   Chlorhexidine Gluconate Cloth  6 each Topical Q0600   heparin  5,000 Units Subcutaneous Q8H   lidocaine  1 patch Transdermal Q24H   metoprolol succinate  25 mg Oral Daily   Continuous Infusions:  sodium chloride     sodium chloride     sodium chloride     ampicillin-sulbactam (UNASYN) IV 200 mL/hr at 09/17/20 1557   PRN Meds: sodium chloride, sodium chloride, sodium chloride, acetaminophen, alteplase, fentaNYL (SUBLIMAZE) injection, heparin, lidocaine (PF), lidocaine-prilocaine, ondansetron (ZOFRAN) IV, pentafluoroprop-tetrafluoroeth  Allergies:    Allergies  Allergen Reactions   Cephalexin Hives   Hydralazine Hives    Social History:   Social History   Socioeconomic History   Marital status: Divorced    Spouse name: Not on file   Number of children: Not on file   Years of education: Not on file   Highest education level: Not on file  Occupational History   Not on file  Tobacco Use   Smoking status: Never   Smokeless tobacco: Never  Vaping Use   Vaping Use: Never used  Substance and Sexual Activity   Alcohol use: Not Currently   Drug use: Never   Sexual activity: Not on file  Other Topics Concern   Not on file  Social History Narrative   Not on file   Social Determinants of Health   Financial Resource Strain: Not on file  Food Insecurity: Not on file  Transportation Needs:  Not on file  Physical Activity: Not on file  Stress: Not on file  Social Connections: Not on file  Intimate Partner Violence: Not on file    Family History:    Family History  Problem Relation Age of Onset   Diabetes Mellitus II Mother    Stroke Mother      ROS:  Please see the history of present illness.  As noteda bove All other ROS reviewed and negative.     Physical Exam/Data:   Vitals:   09/17/20 2155 09/17/20 2210 09/17/20 2307 09/18/20 0101  BP: 139/67 (!) 144/81 105/65 (!) 100/58  Pulse: (!) 118 (!) 122 (!) 103 (!) 101  Resp: 20 (!) 24 18 20   Temp: 97.9 F (36.6 C) 97.9 F (36.6 C) 98.4 F (36.9 C) 98.2 F (36.8 C)  TempSrc: Oral Oral Oral Oral  SpO2:   97% 95%  Weight:  76.9 kg    Height:        Intake/Output Summary (Last 24 hours) at 09/18/2020 0108 Last data filed at 09/17/2020 2210 Gross per 24 hour  Intake 525.29 ml  Output 1100 ml  Net -574.71 ml   Last 3 Weights 09/17/2020 09/17/2020 09/15/2020  Weight (lbs) 169 lb 8.5 oz 174 lb 6.1 oz 169 lb 12.1 oz  Weight (kg) 76.9 kg 79.1 kg 77 kg     Body mass index is 22.99 kg/m.  General:  Well nourished, well developed, in no acute distress HEENT: normal Lymph: no adenopathy Neck: no JVD Endocrine:  No thryomegaly Vascular: No carotid bruits; FA pulses 2+ bilaterally without bruits  Left AVF Cardiac:  normal S1, S2; RRR; no murmur  Lungs:  clear to auscultation bilaterally, no wheezing, rhonchi or rales  Abd: soft, nontender, no hepatomegaly  Ext: no edema Musculoskeletal:  No deformities, BUE and BLE strength normal and equal Skin: warm and dry  Neuro:  CNs 2-12 intact, no focal abnormalities noted Psych:  Normal affect     Laboratory Data:  High Sensitivity Troponin:   Recent Labs  Lab 09/15/20 1526 09/15/20 1806  TROPONINIHS 96* 87*     Chemistry Recent Labs  Lab 09/15/20 2025 09/16/20 0335 09/17/20 0319  NA 137 135 137  K 3.4* 3.7 3.4*  CL 99 99 99  CO2 24 25 28   GLUCOSE 90  81 99  BUN 31* 34* 18  CREATININE 7.61* 7.93* 4.86*  CALCIUM 8.0* 8.0* 8.0*  GFRNONAA 7* 6* 12*  ANIONGAP 14 11 10     Recent Labs  Lab 09/15/20 1526 09/15/20 2025  PROT 6.4*  --   ALBUMIN 3.4* 2.8*  AST 25  --   ALT 14  --   ALKPHOS 108  --   BILITOT 1.0  --    Hematology Recent Labs  Lab 09/15/20 2025 09/16/20 0335 09/17/20 0319  WBC 8.2 7.3 6.9  RBC 2.88* 2.88* 3.07*  HGB 9.8* 9.8* 10.3*  HCT 30.4* 30.9* 32.1*  MCV 105.6* 107.3* 104.6*  MCH 34.0 34.0 33.6  MCHC 32.2 31.7 32.1  RDW 16.8* 16.8* 16.7*  PLT 91* 87* 88*   BNP Recent Labs  Lab 09/15/20 1618  BNP >4,500.0*    DDimer No results for input(s): DDIMER in the last 168 hours.   Radiology/Studies:  DG Chest Port 1 View  Result Date: 09/15/2020 CLINICAL DATA:  Onset right chest pain during dialysis today. EXAM: PORTABLE CHEST 1 VIEW COMPARISON:  PA and lateral chest 04/14/2020. FINDINGS: There is bilateral airspace disease and left greater than right pleural effusions. Cardiomegaly. Aortic atherosclerosis. No pneumothorax. No acute or focal bony abnormality. IMPRESSION: Left greater than right pleural effusions and airspace disease have an appearance most compatible with congestive failure but could reflect pneumonia. Cardiomegaly. Aortic Atherosclerosis (ICD10-I70.0). Electronically Signed   By: Inge Rise M.D.   On: 09/15/2020 16:03   ECHOCARDIOGRAM COMPLETE  Result Date: 09/16/2020    ECHOCARDIOGRAM REPORT   Patient Name:   MATTISON STUCKEY Date of Exam: 09/16/2020 Medical Rec #:  [id number]      Height:       72.0 in  Accession #:    [id number]     Weight:       169.8 lb Date of Birth:  1940-08-19     BSA:          1.987 m Patient Age:    80 years       BP:           118/59 mmHg Patient Gender: M              HR:           96 bpm. Exam Location:  Inpatient Procedure: 2D Echo Indications:    acute diastolic chf  History:        Patient has prior history of Echocardiogram examinations, most                  recent 09/24/2019. Abdominal aortic aneurysm, Chronic kidney                 disease. Cirrhosis, Signs/Symptoms:elevated troponin; Risk                 Factors:Hypertension.  Sonographer:    Johny Chess Referring Phys: [id number] [place]  1. Left ventricular ejection fraction, by estimation, is 25 to 30%. The left ventricle has severely decreased function. The left ventricle demonstrates global hypokinesis. Left ventricular diastolic parameters are consistent with Grade I diastolic dysfunction (impaired relaxation).  2. Right ventricular systolic function is mildly reduced. The right ventricular size is normal. There is normal pulmonary artery systolic pressure. The estimated right ventricular systolic pressure is 54.0 mmHg.  3. Left atrial size was moderately dilated.  4. Right atrial size was mildly dilated.  5. The mitral valve is normal in structure. Trivial mitral valve regurgitation. No evidence of mitral stenosis. Moderate mitral annular calcification.  6. The aortic valve is tricuspid. Aortic valve regurgitation is not visualized. Mild to moderate aortic valve sclerosis/calcification is present, without any evidence of aortic stenosis.  7. Aortic dilatation noted. There is mild dilatation of the ascending aorta, measuring 40 mm.  8. The inferior vena cava is dilated in size with >50% respiratory variability, suggesting right atrial pressure of 8 mmHg. FINDINGS  Left Ventricle: Left ventricular ejection fraction, by estimation, is 25 to 30%. The left ventricle has severely decreased function. The left ventricle demonstrates global hypokinesis. The left ventricular internal cavity size was normal in size. There is no left ventricular hypertrophy. Left ventricular diastolic parameters are consistent with Grade I diastolic dysfunction (impaired relaxation). Right Ventricle: The right ventricular size is normal. No increase in right ventricular wall thickness. Right ventricular systolic  function is mildly reduced. There is normal pulmonary artery systolic pressure. The tricuspid regurgitant velocity is 1.61 m/s, and with an assumed right atrial pressure of 8 mmHg, the estimated right ventricular systolic pressure is 08.6 mmHg. Left Atrium: Left atrial size was moderately dilated. Right Atrium: Right atrial size was mildly dilated. Pericardium: There is no evidence of pericardial effusion. Mitral Valve: The mitral valve is normal in structure. Moderate mitral annular calcification. Trivial mitral valve regurgitation. No evidence of mitral valve stenosis. Tricuspid Valve: The tricuspid valve is normal in structure. Tricuspid valve regurgitation is trivial. Aortic Valve: The aortic valve is tricuspid. Aortic valve regurgitation is not visualized. Mild to moderate aortic valve sclerosis/calcification is present, without any evidence of aortic stenosis. Pulmonic Valve: The pulmonic valve was normal in structure. Pulmonic valve regurgitation is not visualized. Aorta: The aortic root is normal in size and structure and aortic  dilatation noted. There is mild dilatation of the ascending aorta, measuring 40 mm. Venous: The inferior vena cava is dilated in size with greater than 50% respiratory variability, suggesting right atrial pressure of 8 mmHg. IAS/Shunts: No atrial level shunt detected by color flow Doppler.  LEFT VENTRICLE PLAX 2D LVIDd:         5.60 cm      Diastology LVIDs:         4.40 cm      LV e' medial:    5.00 cm/s LV PW:         1.00 cm      LV E/e' medial:  19.5 LV IVS:        0.90 cm      LV e' lateral:   10.10 cm/s LVOT diam:     2.20 cm      LV E/e' lateral: 9.7 LV SV:         92 LV SV Index:   46 LVOT Area:     3.80 cm  LV Volumes (MOD) LV vol d, MOD A2C: 154.0 ml LV vol d, MOD A4C: 199.0 ml LV vol s, MOD A2C: 107.0 ml LV vol s, MOD A4C: 137.0 ml LV SV MOD A2C:     47.0 ml LV SV MOD A4C:     199.0 ml LV SV MOD BP:      60.9 ml RIGHT VENTRICLE             IVC RV S prime:     13.30 cm/s   IVC diam: 2.40 cm TAPSE (M-mode): 2.4 cm LEFT ATRIUM              Index       RIGHT ATRIUM           Index LA diam:        5.10 cm  2.57 cm/m  RA Area:     21.90 cm LA Vol (A2C):   101.0 ml 50.83 ml/m RA Volume:   66.60 ml  33.52 ml/m LA Vol (A4C):   93.2 ml  46.90 ml/m LA Biplane Vol: 97.0 ml  48.81 ml/m  AORTIC VALVE LVOT Vmax:   125.00 cm/s LVOT Vmean:  85.200 cm/s LVOT VTI:    0.241 m  AORTA Ao Root diam: 3.50 cm Ao Asc diam:  4.00 cm MITRAL VALVE                TRICUSPID VALVE MV Area (PHT): 4.96 cm     TR Peak grad:   10.4 mmHg MV Decel Time: 153 msec     TR Vmax:        161.00 cm/s MV E velocity: 97.50 cm/s MV A velocity: 112.00 cm/s  SHUNTS MV E/A ratio:  0.87         Systemic VTI:  0.24 m                             Systemic Diam: 2.20 cm Loralie Champagne MD Electronically signed by Loralie Champagne MD Signature Date/Time: 09/16/2020/5:29:59 PM    Final    CT Angio Chest/Abd/Pel for Dissection W and/or W/WO  Result Date: 09/15/2020 CLINICAL DATA:  Abdominal pain, suspected aortic dissection EXAM: CT ANGIOGRAPHY CHEST, ABDOMEN AND PELVIS TECHNIQUE: Multidetector CT imaging through the chest, abdomen and pelvis was performed using the standard protocol during bolus administration of intravenous contrast. Multiplanar reconstructed images and MIPs were obtained and reviewed to evaluate  the vascular anatomy. CONTRAST:  156mL OMNIPAQUE IOHEXOL 350 MG/ML SOLN COMPARISON:  CT 04/14/2020 and previous FINDINGS: CTA CHEST FINDINGS Cardiovascular: 4-chamber mild cardiomegaly has developed. No pericardial effusion. Satisfactory opacification of pulmonary arteries noted, and there is no evidence of pulmonary emboli. Coarse coronary calcifications. Good contrast opacification of the thoracic aorta, without dissection or aneurysm. Moderate calcified atheromatous plaque in the arch and descending thoracic segment. 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 Aortic Root: --Valve: 2.7 cm with coarse  calcifications on the leaflets --Sinuses: 3.6 cm --Sinotubular Junction: 3.1 cm Limitations by motion: Moderate Thoracic Aorta: --Ascending Aorta: 4.1   cm --Aortic Arch: 4 cm --Descending Aorta: 3.3 cm Mediastinum/Nodes: No mass or adenopathy. Lungs/Pleura: Moderate pleural effusions have developed. There are extensive opacities throughout the left lower lobe, and dependent atelectasis/consolidation in the right lower lobe. No pneumothorax. Musculoskeletal: Anterior vertebral endplate spurring at multiple levels in the lower thoracic spine. Left shoulder DJD. 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 Review of the MIP images confirms the above findings. CTA ABDOMEN AND PELVIS FINDINGS VASCULAR Aorta: Patent infrarenal bifurcated aortic stent graft as before, with mild narrowing of the left limb, stable. Native sac diameter 6.1 x 6 cm maximum transverse dimensions (previously 5.9 by my measurement). Limited assessment for endoleak on this single phase study. Celiac: Patent.  Splenic artery diffusely calcified. SMA: Patent without evidence of aneurysm, dissection, vasculitis or significant stenosis. Renals: Single bilaterally, both with heavily calcified ostial plaque resulting in short segment stenosis of at least moderate severity, patent distally. IMA: Proximal occlusion, reconstituted distally by visceral collaterals. Inflow: Right limb of the stent graft extends to the distal common iliac, well apposed. Left limb extends to distal common iliac, well apposed. Calcified plaque in the native internal iliac arteries without aneurysm. Native external iliac arteries are mildly tortuous, patent. Veins: No obvious venous abnormality within the limitations of this arterial phase study. Review of the MIP images confirms the above findings. NON-VASCULAR Hepatobiliary: No focal liver abnormality is seen. Status post cholecystectomy. No biliary dilatation. Pancreas: Unremarkable. No  pancreatic ductal dilatation or surrounding inflammatory changes. Spleen: Normal in size without focal abnormality. Adrenals/Urinary Tract: Adrenal glands unremarkable. Bilateral renal parenchymal atrophy. No hydronephrosis. 2.5 cm exophytic probable cyst from the mid right kidney, stable. Urinary bladder is incompletely distended. Stomach/Bowel: Stomach is partially distended, unremarkable. The small bowel is decompressed. Normal appendix. Innumerable descending and sigmoid diverticula. Lymphatic: No abdominal or pelvic adenopathy. Reproductive: Penile pump components stable position. Previous prostatectomy. Other: Small volume abdominal ascites, new since previous. No free air. Musculoskeletal: Spondylitic changes in the lower lumbar spine. Left hip DJD. No displaced fracture or worrisome bone lesion. Review of the MIP images confirms the above findings. IMPRESSION: 1. Negative for acute PE or thoracic aortic dissection. 2. 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 Recommend annual imaging followup by CTA or MRA. This recommendation follows 2010 ACCF/AHA/AATS/ACR/ASA/SCA/SCAI/SIR/STS/SVM Guidelines for the Diagnosis and Management of Patients with Thoracic Aortic Disease. Circulation. 2010; 121: Z610-R604. 3. New moderate pleural effusions with bilateral pulmonary opacities. 4. Small volume  Abdominal ascites. 5. Patent aortic stent graft with 6.1 cm native sac diameter (previously 5.9). 6. Descending and sigmoid diverticulosis Electronically Signed   By: Lucrezia Europe M.D.   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  Telemetry:  Telemetry was personally reviewed and demonstrates:  afib RVR episodes and then to sinus tachycardia  Relevant CV  Studies: ECHO: 09/16/20 IMPRESSIONS     1. Left ventricular ejection fraction, by estimation, is 25 to 30%. The  left ventricle has severely decreased function. The left ventricle  demonstrates global hypokinesis. Left ventricular diastolic parameters are  consistent with Grade I diastolic  dysfunction (impaired relaxation).   2. Right ventricular systolic function is mildly reduced. The right  ventricular size is normal. There is normal pulmonary artery systolic  pressure. The estimated right ventricular systolic pressure is 67.2 mmHg.   3. Left atrial size was moderately dilated.   4. Right atrial size was mildly dilated.   5. The mitral valve is normal in structure. Trivial mitral valve  regurgitation. No evidence of mitral stenosis. Moderate mitral annular  calcification.   6. The aortic valve is tricuspid. Aortic valve regurgitation is not  visualized. Mild to moderate aortic valve sclerosis/calcification is  present, without any evidence of aortic stenosis.   7. Aortic dilatation noted. There is mild dilatation of the ascending  aorta, measuring 40 mm.   8. The inferior vena cava is dilated in size with >50% respiratory  variability, suggesting right atrial pressure of 8 mmHg.   Assessment and Plan:   Q waves with ST junction more pronounced/elevation in anteroseptal leads (No chest pain or reciprocal changes to consider STEMI) Newly reduced LVEF 25-30% Paroxysmal Afib RVR- chadsvasc 5 ?Right sided chest pain ESRD on HD TTS via Left UE AVF HTN, HLD H/o AAA s/p endovascular repair in 2020 (CT showing patent aortic stent graft with 6.1cm native sac diameter - previously 5.9cm) 4.1 Ascending aortic aneurysm. H/o cirrhosis  Plan:  - his EKG changes are dynamic in V2-V3 showing ST junction/elevation without reciprocal changes and he has q waves in anteroseptal leads. He has no chest pain currently or any symptoms. I d/w the case with On call Interventionalist (Dr Claiborne Billings) as  well. Given he has no symptoms, we will not activate the lab. Rx medically now and plan for elective case in am. - give him aspirin 325mg , start toprol xl 25mg  daily, atorvastatin 80mg  daily and begin heparin gtt  - given EKG changes and newly reduced LVEF- we will proceed with LHC/CA this am. Keep him NPO.  - continue to trend trops. If he has chest pain- please contact use asap. - his afib is paroxysmal and is back to sinus rhythm now: continue anticoagulation  - keep K>4.0, Mg>2.0 - volume removal by HD (ESRD on HD) - may need to discuss with Vascular sx regarding s/p endovascular aneurysm repair with sac at 6.1cm now - continue home medications.  Risk Assessment/Risk Scores:     TIMI Risk Score for Unstable Angina or Non-ST Elevation MI:   The patient's TIMI risk score is  , which indicates a  % risk of all cause mortality, new or recurrent myocardial infarction or need for urgent revascularization in the next 14 days.  New York Heart Association (NYHA) Functional Class NYHA Class II  CHA2DS2-VASc Score = 5  This indicates a 7.2% annual risk of stroke. The patient's score is based upon: CHF History: Yes HTN History: Yes Diabetes History: No Stroke History: No Vascular Disease History: Yes Age Score: 2 Gender Score: 0        For questions or updates, please contact [place] Please consult [email or web address] for contact  info under    Signed, Renae Fickle, MD  09/18/2020 1:08 AM

## 2020-09-18 NOTE — Progress Notes (Signed)
Physical Therapy Treatment Patient Details Name: Ricky Harris MRN: [id number] DOB: 07/02/1940 Today's Date: 09/18/2020    History of Present Illness 80yo male admitted 09/15/20 presenting with R chest pain which developed during HD session. Only able to tolerate 30 minutes of HD due to pain. Negative for PE and aortic dissection. Found to have B pleural effusions and acute on chronic CHF. PMH AAA, aortic stenosis, CA, CKD, gout, HLD, HTN, pre-DM    PT Comments    Pt supine in bed on entry with son in room awaiting heart cath. Pt agreeable to walk with therapy but admits that he is feeling tired. Pt limited in safe mobility by generalized weakness, decreased balance and decreased endurance. Pt is independent with bed mobility, and min guard for transfers. Pt reports he has RW at home but does not use it often. Pt starts ambulation with IV pole, gait with scissoring requiring minA for steadying. Pt continues to progress mobility pushing IV pole but also reaching for handrail in hallway to improve balance. Pt requires 2x standing rest breaks due to fatigue, no dyspnea noted and HR in mid 90s. D/c plan remains appropriate, educated pt that he should use RW for safety while working on building strength and endurance. PT will continue to follow acutely.   Follow Up Recommendations  Outpatient PT     Equipment Recommendations  None recommended by PT       Precautions / Restrictions Precautions Precautions: Fall;Other (comment) Precaution Comments: chronic back pain Restrictions Weight Bearing Restrictions: No    Mobility  Bed Mobility Overal bed mobility: Independent                  Transfers Overall transfer level: Needs assistance   Transfers: Sit to/from Stand Sit to Stand: Min guard         General transfer comment: Min guard for steadying/safety.  Ambulation/Gait Ambulation/Gait assistance: Min Web designer (Feet): 50 Feet Assistive device: IV Pole Gait  Pattern/deviations: Step-through pattern;Scissoring;Drifts right/left Gait velocity: decreased Gait velocity interpretation: <1.8 ft/sec, indicate of risk for recurrent falls General Gait Details: min A for steadying while pushing IV pole, pt also seeks UE support from handrail in hallway, after slight scissoring ambulating across open floor requiring heavy min A for LoB, pt reports this is not his normal, pt also requires 2x standing rest break, no dyspnea noted pt reports "just tired" HR in mid 90s throughout ambulation          Balance Overall balance assessment: Needs assistance Sitting-balance support: No upper extremity supported;Feet supported Sitting balance-Leahy Scale: Good     Standing balance support: No upper extremity supported;Bilateral upper extremity supported;During functional activity Standing balance-Leahy Scale: Poor Standing balance comment: Reliant on BUE on RW.                            Cognition Arousal/Alertness: Awake/alert Behavior During Therapy: WFL for tasks assessed/performed;Flat affect Overall Cognitive Status: Within Functional Limits for tasks assessed                                           General Comments General comments (skin integrity, edema, etc.): HR in mid 80s at rest and mid 90s with ambulation      Pertinent Vitals/Pain Pain Assessment: No/denies pain     PT Goals (current goals can now  be found in the care plan section) Acute Rehab PT Goals Patient Stated Goal: To return home. PT Goal Formulation: With patient Time For Goal Achievement: 10/01/20 Potential to Achieve Goals: Good Progress towards PT goals: Progressing toward goals    Frequency    Min 3X/week      PT Plan Current plan remains appropriate       AM-PAC PT "6 Clicks" Mobility   Outcome Measure  Help needed turning from your back to your side while in a flat bed without using bedrails?: None Help needed moving from  lying on your back to sitting on the side of a flat bed without using bedrails?: None Help needed moving to and from a bed to a chair (including a wheelchair)?: A Little Help needed standing up from a chair using your arms (e.g., wheelchair or bedside chair)?: A Little Help needed to walk in hospital room?: A Little Help needed climbing 3-5 steps with a railing? : A Little 6 Click Score: 20    End of Session Equipment Utilized During Treatment: Gait belt Activity Tolerance: Patient tolerated treatment well Patient left: in chair;with call bell/phone within reach Nurse Communication: Mobility status PT Visit Diagnosis: Muscle weakness (generalized) (M62.81);Pain Pain - part of body:  (back)     Time: 0947-0962 PT Time Calculation (min) (ACUTE ONLY): 17 min  Charges:  $Therapeutic Exercise: 8-22 mins                     Alaisha Eversley B. Migdalia Dk PT, DPT Acute Rehabilitation Services Pager [phone] Office [phone]    [place] 09/18/2020, 12:30 PM

## 2020-09-18 NOTE — Progress Notes (Signed)
PROGRESS NOTE    Ricky Harris  ZOX:[id number] DOB: 1941-02-23 DOA: 09/15/2020 PCP: [name]., MD   Brief Narrative:  Ricky Harris is a 80 y.o. male with medical history significant for ESRD on hemodialysis, liver cirrhosis, hypertension, hypothyroidism, AAA with stent graft, and coronary artery disease seen on CT scan, presented from his dialysis center for evaluation of right-sided chest pain.  Patient reports a nonproductive cough for 1 to 2 weeks, has developed some exertional dyspnea over this interval, and then right-sided chest pain in the past 2 to 3 days.  Pain becoming severe during dialysis which was stopped after 30 minutes so that the patient could be evaluated in the ED.    Upon arrival to the ED, patient is found to be afebrile, saturating mid 90s on room air, mildly tachypneic, and with stable blood pressure.  EKG features sinus rhythm with LAD.  Chest x-ray with left greater than right pleural effusions.  CTA chest/abdomen/pelvis is negative for PE or aortic dissection but notable for ascending thoracic aortic aneurysm and new moderate pleural effusions bilaterally.  Chemistry panel with potassium 3.2.  CBC notable for macrocytic anemia with hemoglobin 10.2 and thrombocytopenia with platelets 90,000.  BNP is >4500 and high-sensitivity troponin 96 and then 87.  Nephrology was consulted by the ED physician, patient was treated with fentanyl, and hospitalist asked to admit.  Assessment & Plan:   Principal Problem:   Bilateral pleural effusion Active Problems:   Essential hypertension   AAA (abdominal aortic aneurysm) without rupture (HCC)   ESRD (end stage renal disease) (HCC)   Cirrhosis of liver (HCC)   Fluid overload, unspecified   Thrombocytopenia (HCC)   Thoracic aortic aneurysm (HCC)   Elevated troponin   Chest pain   CAP (community acquired pneumonia)   A-fib ([place])   1. Bilateral pleural effusions; acute on chronic diastolic CHF   - BNP >8119 in ED,  reports recent wt gain, had EF 60-65% on yr ago, has cirrhosis and ESRD and only urinates every several days.  Nephrology consulted.  This should be taken care of with scheduled hemodialysis sessions.  He saturating about 94% on room air.  I doubt he will need thoracentesis since he is not hypoxic and looks comfortable on clinical exam.    2. ESRD: Left HD after 30 minutes on the day of admission to come to ED for evaluation of right-sided chest pain.  Nephrology on board now.  Management per them.   3.  Liver cirrhosis  - Appears compensated    4.  Right-sided chest pain: Denies any chest pain today.  CTA negative for PE.  Likely pleuritic pain.   5. Elevated troponin: Slightly elevated but flat, not indicative of ACS.  Monitor symptoms.  6. Hx AAA s/p endovascular repair, - CTA in ED negative for dissection but notable for 4.1 cm ascending thoracic aortic aneurysm and patent aortic stent graft with 6.1 cm native sac diameter up from 5.9 cm. outpatient follow-up recommended, however, I will consult with vascular surgery today.   7. Thrombocytopenia - Platelets 90k in ED, appears chronic, no apparent bleeding or infection, likely related to chronic liver disease    8.  Community-acquired pneumonia: Chest x-ray also suspicious for bilateral, left greater than right airspace disease.  Continue Unasyn.  9.  New onset atrial fibrillation: After dialysis on the evening of 09/17/2020, patient was noted to have developed new onset of atrial fibrillation.  Cardiology was consulted.  Patient is spontaneously converted back  to normal sinus rhythm.  Still in normal sinus rhythm.  Cardiology on board and managing.  Patient on Toprol-XL and aspirin.  10.  Acute systolic CHF: Severe reduction in RV and LV noted with EF of 25 to 30% which is new compared to the echo 1 year ago.  Patient's troponin only minimally elevated.  Cardiology consulted.  Patient remains on heparin drip.  Plan for right and left heart  cath by cardiology today.  11.  Dyslipidemia: Now on atorvastatin 80 mg p.o. daily.  12.  Hypokalemia: We will replace.  DVT prophylaxis:    Code Status: Full Code  Family Communication: None present at bedside.  Plan of care discussed with patient in length and he verbalized understanding and agreed with it.  Status is: Inpatient  Remains inpatient appropriate because:Inpatient level of care appropriate due to severity of illness  Dispo: The patient is from: Home              Anticipated d/c is to: Home              Patient currently is not medically stable to d/c.   Difficult to place patient No   Estimated body mass index is 22.99 kg/m as calculated from the following:   Height as of this encounter: 6' (1.829 m).   Weight as of this encounter: 76.9 kg.   Nutritional status:  Consultants:  Nephrology Cardiology  Procedures:  None  Antimicrobials:  Anti-infectives (From admission, onward)    Start     Dose/Rate Route Frequency Ordered Stop   09/17/20 0000  amoxicillin-clavulanate (AUGMENTIN) 500-125 MG tablet        1 tablet Oral 2 times daily 09/17/20 0945 09/23/20 2359   09/16/20 1600  ampicillin-sulbactam (UNASYN) 1.5 g in sodium chloride 0.9 % 100 mL IVPB        1.5 g 200 mL/hr over 30 Minutes Intravenous Every 24 hours 09/16/20 0931     09/16/20 0800  cefTRIAXone (ROCEPHIN) 1 g in sodium chloride 0.9 % 100 mL IVPB  Status:  Discontinued        1 g 200 mL/hr over 30 Minutes Intravenous Every 24 hours 09/16/20 0753 09/16/20 0903   09/16/20 0800  azithromycin (ZITHROMAX) 250 mg in dextrose 5 % 125 mL IVPB  Status:  Discontinued        250 mg 125 mL/hr over 60 Minutes Intravenous Every 24 hours 09/16/20 0753 09/16/20 0903          Subjective: Patient seen and examined today.  Son at the bedside.  Patient has no complaints, denied any shortness of breath or chest pain.  He is fully alert and oriented and comfortable.  Objective: Vitals:   09/18/20 0256  09/18/20 0510 09/18/20 0910 09/18/20 0959  BP: 106/64 105/65 101/63   Pulse: 96 88 84   Resp: 16 16 18    Temp: 98.8 F (37.1 C) 98.7 F (37.1 C) 98.1 F (36.7 C)   TempSrc: Oral Oral Oral   SpO2: 92% 94% 99%   Weight:  76.9 kg  76.9 kg  Height:        Intake/Output Summary (Last 24 hours) at 09/18/2020 1240 Last data filed at 09/18/2020 1200 Gross per 24 hour  Intake 433.06 ml  Output 1100 ml  Net -666.94 ml    Filed Weights   09/17/20 2210 09/18/20 0510 09/18/20 0959  Weight: 76.9 kg 76.9 kg 76.9 kg    Examination:  General exam: Appears calm and comfortable  Respiratory  system: Clear to auscultation. Respiratory effort normal. Cardiovascular system: S1 & S2 heard, RRR. No JVD, murmurs, rubs, gallops or clicks. No pedal edema. Gastrointestinal system: Abdomen is nondistended, soft and nontender. No organomegaly or masses felt. Normal bowel sounds heard. Central nervous system: Alert and oriented. No focal neurological deficits. Extremities: Symmetric 5 x 5 power. Skin: No rashes, lesions or ulcers.  Psychiatry: Judgement and insight appear normal. Mood & affect appropriate.    Data Reviewed: I have personally reviewed following labs and imaging studies  CBC: Recent Labs  Lab 09/15/20 1526 09/15/20 2025 09/16/20 0335 09/17/20 0319 09/18/20 0107  WBC 6.7 8.2 7.3 6.9 5.0  NEUTROABS 5.8  --   --   --   --   HGB 10.2* 9.8* 9.8* 10.3* 10.2*  HCT 33.2* 30.4* 30.9* 32.1* 31.8*  MCV 108.1* 105.6* 107.3* 104.6* 104.3*  PLT 90* 91* 87* 88* 87*    Basic Metabolic Panel: Recent Labs  Lab 09/15/20 1526 09/15/20 2025 09/16/20 0335 09/17/20 0319 09/18/20 0107  NA 140 137 135 137 138  K 3.2* 3.4* 3.7 3.4* 3.3*  CL 101 99 99 99 99  CO2 27 24 25 28 28   GLUCOSE 82 90 81 99 141*  BUN 29* 31* 34* 18 12  CREATININE 7.10* 7.61* 7.93* 4.86* 3.54*  CALCIUM 8.3* 8.0* 8.0* 8.0* 8.2*  MG  --   --   --   --  1.8  PHOS  --  4.2  --   --   --     GFR: Estimated Creatinine  Clearance: 18.7 mL/min (A) (by C-G formula based on SCr of 3.54 mg/dL (H)). Liver Function Tests: Recent Labs  Lab 09/15/20 1526 09/15/20 2025  AST 25  --   ALT 14  --   ALKPHOS 108  --   BILITOT 1.0  --   PROT 6.4*  --   ALBUMIN 3.4* 2.8*    Recent Labs  Lab 09/15/20 1526  LIPASE 27    No results for input(s): AMMONIA in the last 168 hours. Coagulation Profile: Recent Labs  Lab 09/15/20 1526  INR 1.2    Cardiac Enzymes: No results for input(s): CKTOTAL, CKMB, CKMBINDEX, TROPONINI in the last 168 hours. BNP (last 3 results) No results for input(s): PROBNP in the last 8760 hours. HbA1C: No results for input(s): HGBA1C in the last 72 hours. CBG: No results for input(s): GLUCAP in the last 168 hours. Lipid Profile: No results for input(s): CHOL, HDL, LDLCALC, TRIG, CHOLHDL, LDLDIRECT in the last 72 hours. Thyroid Function Tests: Recent Labs    09/18/20 0107  TSH 4.650*   Anemia Panel: No results for input(s): VITAMINB12, FOLATE, FERRITIN, TIBC, IRON, RETICCTPCT in the last 72 hours. Sepsis Labs: Recent Labs  Lab 09/16/20 0335  PROCALCITON 0.60    Recent Results (from the past 240 hour(s))  Resp Panel by RT-PCR (Flu A&B, Covid) Nasopharyngeal Swab     Status: None   Collection Time: 09/15/20  4:14 PM   Specimen: Nasopharyngeal Swab; Nasopharyngeal(NP) swabs in vial transport medium  Result Value Ref Range Status   SARS Coronavirus 2 by RT PCR NEGATIVE NEGATIVE Final    Comment: (NOTE) SARS-CoV-2 target nucleic acids are NOT DETECTED.  The SARS-CoV-2 RNA is generally detectable in upper respiratory specimens during the acute phase of infection. The lowest concentration of SARS-CoV-2 viral copies this assay can detect is 138 copies/mL. A negative result does not preclude SARS-Cov-2 infection and should not be used as the sole basis for treatment  or other patient management decisions. A negative result may occur with  improper specimen collection/handling,  submission of specimen other than nasopharyngeal swab, presence of viral mutation(s) within the areas targeted by this assay, and inadequate number of viral copies(<138 copies/mL). A negative result must be combined with clinical observations, patient history, and epidemiological information. The expected result is Negative.  Fact Sheet for Patients:  EntrepreneurPulse.com.au  Fact Sheet for Healthcare Providers:  IncredibleEmployment.be  This test is no t yet approved or cleared by the [place] FDA and  has been authorized for detection and/or diagnosis of SARS-CoV-2 by FDA under an Emergency Use Authorization (EUA). This EUA will remain  in effect (meaning this test can be used) for the duration of the COVID-19 declaration under Section 564(b)(1) of the Act, 21 U.S.C.section 360bbb-3(b)(1), unless the authorization is terminated  or revoked sooner.       Influenza A by PCR NEGATIVE NEGATIVE Final   Influenza B by PCR NEGATIVE NEGATIVE Final    Comment: (NOTE) The Xpert Xpress SARS-CoV-2/FLU/RSV plus assay is intended as an aid in the diagnosis of influenza from Nasopharyngeal swab specimens and should not be used as a sole basis for treatment. Nasal washings and aspirates are unacceptable for Xpert Xpress SARS-CoV-2/FLU/RSV testing.  Fact Sheet for Patients: EntrepreneurPulse.com.au  Fact Sheet for Healthcare Providers: IncredibleEmployment.be  This test is not yet approved or cleared by the [place] FDA and has been authorized for detection and/or diagnosis of SARS-CoV-2 by FDA under an Emergency Use Authorization (EUA). This EUA will remain in effect (meaning this test can be used) for the duration of the COVID-19 declaration under Section 564(b)(1) of the Act, 21 U.S.C. section 360bbb-3(b)(1), unless the authorization is terminated or revoked.  Performed at [facility], [place] [address] [place], [place] [address]        Radiology Studies: ECHOCARDIOGRAM COMPLETE  Result Date: 09/16/2020    ECHOCARDIOGRAM REPORT   Patient Name:   Ricky Harris Date of Exam: 09/16/2020 Medical Rec #:  [id number]      Height:       72.0 in Accession #:    [id number]     Weight:       169.8 lb Date of Birth:  03-23-1940     BSA:          1.987 m Patient Age:    51 years       BP:           118/59 mmHg Patient Gender: M              HR:           96 bpm. Exam Location:  Inpatient Procedure: 2D Echo Indications:    acute diastolic chf  History:        Patient has prior history of Echocardiogram examinations, most                 recent 09/24/2019. Abdominal aortic aneurysm, Chronic kidney                 disease. Cirrhosis, Signs/Symptoms:elevated troponin; Risk                 Factors:Hypertension.  Sonographer:    Johny Chess Referring Phys: [id number] [place]  1. Left ventricular ejection fraction, by estimation, is 25 to 30%. The left ventricle has severely decreased function. The left ventricle demonstrates global hypokinesis. Left ventricular diastolic parameters are consistent with Grade I diastolic  dysfunction (impaired relaxation).  2. Right ventricular systolic function is mildly reduced. The right ventricular size is normal. There is normal pulmonary artery systolic pressure. The estimated right ventricular systolic pressure is 99.3 mmHg.  3. Left atrial size was moderately dilated.  4. Right atrial size was mildly dilated.  5. The mitral valve is normal in structure. Trivial mitral valve regurgitation. No evidence of mitral stenosis. Moderate mitral annular calcification.  6. The aortic valve is tricuspid. Aortic valve regurgitation is not visualized. Mild to moderate aortic valve sclerosis/calcification is present, without any evidence of aortic stenosis.  7. Aortic dilatation noted. There is mild dilatation of the ascending aorta, measuring 40 mm.  8. The inferior vena  cava is dilated in size with >50% respiratory variability, suggesting right atrial pressure of 8 mmHg. FINDINGS  Left Ventricle: Left ventricular ejection fraction, by estimation, is 25 to 30%. The left ventricle has severely decreased function. The left ventricle demonstrates global hypokinesis. The left ventricular internal cavity size was normal in size. There is no left ventricular hypertrophy. Left ventricular diastolic parameters are consistent with Grade I diastolic dysfunction (impaired relaxation). Right Ventricle: The right ventricular size is normal. No increase in right ventricular wall thickness. Right ventricular systolic function is mildly reduced. There is normal pulmonary artery systolic pressure. The tricuspid regurgitant velocity is 1.61 m/s, and with an assumed right atrial pressure of 8 mmHg, the estimated right ventricular systolic pressure is 71.6 mmHg. Left Atrium: Left atrial size was moderately dilated. Right Atrium: Right atrial size was mildly dilated. Pericardium: There is no evidence of pericardial effusion. Mitral Valve: The mitral valve is normal in structure. Moderate mitral annular calcification. Trivial mitral valve regurgitation. No evidence of mitral valve stenosis. Tricuspid Valve: The tricuspid valve is normal in structure. Tricuspid valve regurgitation is trivial. Aortic Valve: The aortic valve is tricuspid. Aortic valve regurgitation is not visualized. Mild to moderate aortic valve sclerosis/calcification is present, without any evidence of aortic stenosis. Pulmonic Valve: The pulmonic valve was normal in structure. Pulmonic valve regurgitation is not visualized. Aorta: The aortic root is normal in size and structure and aortic dilatation noted. There is mild dilatation of the ascending aorta, measuring 40 mm. Venous: The inferior vena cava is dilated in size with greater than 50% respiratory variability, suggesting right atrial pressure of 8 mmHg. IAS/Shunts: No atrial level  shunt detected by color flow Doppler.  LEFT VENTRICLE PLAX 2D LVIDd:         5.60 cm      Diastology LVIDs:         4.40 cm      LV e' medial:    5.00 cm/s LV PW:         1.00 cm      LV E/e' medial:  19.5 LV IVS:        0.90 cm      LV e' lateral:   10.10 cm/s LVOT diam:     2.20 cm      LV E/e' lateral: 9.7 LV SV:         92 LV SV Index:   46 LVOT Area:     3.80 cm  LV Volumes (MOD) LV vol d, MOD A2C: 154.0 ml LV vol d, MOD A4C: 199.0 ml LV vol s, MOD A2C: 107.0 ml LV vol s, MOD A4C: 137.0 ml LV SV MOD A2C:     47.0 ml LV SV MOD A4C:     199.0 ml LV SV MOD BP:  60.9 ml RIGHT VENTRICLE             IVC RV S prime:     13.30 cm/s  IVC diam: 2.40 cm TAPSE (M-mode): 2.4 cm LEFT ATRIUM              Index       RIGHT ATRIUM           Index LA diam:        5.10 cm  2.57 cm/m  RA Area:     21.90 cm LA Vol (A2C):   101.0 ml 50.83 ml/m RA Volume:   66.60 ml  33.52 ml/m LA Vol (A4C):   93.2 ml  46.90 ml/m LA Biplane Vol: 97.0 ml  48.81 ml/m  AORTIC VALVE LVOT Vmax:   125.00 cm/s LVOT Vmean:  85.200 cm/s LVOT VTI:    0.241 m  AORTA Ao Root diam: 3.50 cm Ao Asc diam:  4.00 cm MITRAL VALVE                TRICUSPID VALVE MV Area (PHT): 4.96 cm     TR Peak grad:   10.4 mmHg MV Decel Time: 153 msec     TR Vmax:        161.00 cm/s MV E velocity: 97.50 cm/s MV A velocity: 112.00 cm/s  SHUNTS MV E/A ratio:  0.87         Systemic VTI:  0.24 m                             Systemic Diam: 2.20 cm Loralie Champagne MD Electronically signed by Loralie Champagne MD Signature Date/Time: 09/16/2020/5:29:59 PM    Final     Scheduled Meds:  aspirin EC  81 mg Oral Daily   atorvastatin  80 mg Oral Daily   Chlorhexidine Gluconate Cloth  6 each Topical Q0600   lidocaine  1 patch Transdermal Q24H   metoprolol succinate  25 mg Oral Daily   sodium chloride flush  3 mL Intravenous Q12H   Continuous Infusions:  sodium chloride     sodium chloride     sodium chloride     sodium chloride     [START ON 09/19/2020] sodium chloride      ampicillin-sulbactam (UNASYN) IV 200 mL/hr at 09/17/20 1557   heparin 1,100 Units/hr (09/18/20 0228)     LOS: 3 days   Time spent: 35 minutes   Darliss Cheney, MD [facility]  09/18/2020, 12:40 PM   How to contact the [facility] Attending or Consulting provider [place] or covering provider during after hours 7P -7A, for this patient?  Check the care team in [facility] and look for a) attending/consulting [facility] provider listed and b) the [facility] team listed. Page or secure chat 7A-7P. Log into [email or web address] and use [place]'s universal password to access. If you do not have the password, please contact the hospital operator. Locate the [facility] provider you are looking for under [facility] and page to a number that you can be directly reached. If you still have difficulty reaching the provider, please page the [facility] (Director on Call) for the Hospitalists listed on amion for assistance.

## 2020-09-18 NOTE — Progress Notes (Signed)
ANTICOAGULATION CONSULT NOTE  Pharmacy Consult for heparin Indication:  Afib and r/o ACS  Allergies  Allergen Reactions   Cephalexin Hives   Hydralazine Hives    Patient Measurements: Height: 6' (182.9 cm) Weight: 76.9 kg (169 lb 8.5 oz) IBW/kg (Calculated) : 77.6  Vital Signs: Temp: 98.1 F (36.7 C) (07/22 0910) Temp Source: Oral (07/22 0910) BP: 124/42 (07/22 1719) Pulse Rate: 82 (07/22 1724)  Labs: Recent Labs    09/15/20 1806 09/15/20 2025 09/16/20 0335 09/17/20 0319 09/18/20 0107 09/18/20 0216 09/18/20 0458 09/18/20 1055  HGB  --    < > 9.8* 10.3* 10.2*  --   --   --   HCT  --    < > 30.9* 32.1* 31.8*  --   --   --   PLT  --    < > 87* 88* 87*  --   --   --   HEPARINUNFRC  --   --   --   --   --   --   --  0.30  CREATININE  --    < > 7.93* 4.86* 3.54*  --   --   --   TROPONINIHS 87*  --   --   --   --  103* 110*  --    < > = values in this interval not displayed.     Estimated Creatinine Clearance: 18.7 mL/min (A) (by C-G formula based on SCr of 3.54 mg/dL (H)).   Medications:  Medications Prior to Admission  Medication Sig Dispense Refill Last Dose   amLODipine (NORVASC) 5 MG tablet Take 5 mg by mouth daily.   09/15/2020   atorvastatin (LIPITOR) 10 MG tablet Take 10 mg by mouth daily.   09/15/2020   carvedilol (COREG) 12.5 MG tablet Take 12.5 mg by mouth 2 (two) times daily.   09/15/2020 at 0900   febuxostat (ULORIC) 40 MG tablet Take 1 tablet (40 mg total) by mouth daily. 30 tablet 3 Past Month   lactulose (CHRONULAC) 10 GM/15ML solution Take 15 mLs (10 g total) by mouth daily. 236 mL 1 Past Month   multivitamin (RENA-VIT) TABS tablet Take 1 tablet by mouth daily. 30 tablet 0 09/15/2020   calcitRIOL (ROCALTROL) 0.25 MCG capsule Take 1 capsule (0.25 mcg total) by mouth every Tuesday, Thursday, and Saturday at 6 PM. (Patient not taking: No sig reported) 30 capsule 3 Not Taking   calcium acetate (PHOSLO) 667 MG capsule Take 1 capsule (667 mg total) by mouth 3  (three) times daily with meals. 90 capsule 2    cloNIDine (CATAPRES) 0.1 MG tablet Take 1 tablet (0.1 mg total) by mouth daily. (Patient not taking: No sig reported) 30 tablet 0 Not Taking   pantoprazole (PROTONIX) 40 MG tablet Take 1 tablet (40 mg total) by mouth daily. 30 tablet 1    traMADol (ULTRAM) 50 MG tablet Take 1 tablet (50 mg total) by mouth every 8 (eight) hours as needed for moderate pain. (Patient not taking: Reported on 09/17/2020) 8 tablet 0 Not Taking   Scheduled:   aspirin EC  81 mg Oral Daily   atorvastatin  80 mg Oral Daily   Chlorhexidine Gluconate Cloth  6 each Topical Q0600   lidocaine  1 patch Transdermal Q24H   metoprolol succinate  25 mg Oral Daily   sodium chloride flush  3 mL Intravenous Q12H   Infusions:   sodium chloride     sodium chloride     sodium chloride  ampicillin-sulbactam (UNASYN) IV Stopped (09/17/20 1624)   heparin Stopped (09/18/20 1522)    Assessment: 80yo male admitted for acute on chronic CHF with plan to discharge soon now w/ episode of Afib overnight, ECG done that revealed new ST elevations though no anginal sx, to begin heparin while awaiting possible cath.  Cath earlier today showed 3vCAD, consulting CVTS for possible CABG. Pharmacy consulted to resume Heparin 10 hours after sheath removal without a bolus. Sheath noted to be removed at around 1730.   Goal of Therapy:  Heparin level 0.3-0.7 units/ml Monitor platelets by anticoagulation protocol: Yes   Plan:  - Restart Heparin at 1150 units/hr starting at 0330 on 7/23 - Will continue to monitor for any signs/symptoms of bleeding and will follow up with heparin level in 8 hours after resuming  Thank you for allowing pharmacy to be a part of this patient's care.  Alycia Rossetti, PharmD, BCPS Clinical Pharmacist Clinical phone for 09/18/2020: [phone] 09/18/2020 5:56 PM   **Pharmacist phone directory can now be found on [email or web address] ([email or web address]).  Listed under [place].

## 2020-09-18 NOTE — Progress Notes (Signed)
Progress Note  Patient Name: Ricky Harris Date of Encounter: 09/18/2020  [facility] Cardiologist: None new  Subjective   Feels well this am. No chest pain or palpitations. No dyspnea.   Inpatient Medications    Scheduled Meds:  aspirin EC  81 mg Oral Daily   atorvastatin  80 mg Oral Daily   Chlorhexidine Gluconate Cloth  6 each Topical Q0600   lidocaine  1 patch Transdermal Q24H   metoprolol succinate  25 mg Oral Daily   Continuous Infusions:  sodium chloride     sodium chloride     sodium chloride     ampicillin-sulbactam (UNASYN) IV 200 mL/hr at 09/17/20 1557   heparin 1,100 Units/hr (09/18/20 0228)   PRN Meds: sodium chloride, sodium chloride, sodium chloride, acetaminophen, alteplase, fentaNYL (SUBLIMAZE) injection, heparin, lidocaine (PF), lidocaine-prilocaine, ondansetron (ZOFRAN) IV, pentafluoroprop-tetrafluoroeth   Vital Signs    Vitals:   09/17/20 2307 09/18/20 0101 09/18/20 0256 09/18/20 0510  BP: 105/65 (!) 100/58 106/64 105/65  Pulse: (!) 103 (!) 101 96 88  Resp: 18 20 16 16   Temp: 98.4 F (36.9 C) 98.2 F (36.8 C) 98.8 F (37.1 C) 98.7 F (37.1 C)  TempSrc: Oral Oral Oral Oral  SpO2: 97% 95% 92% 94%  Weight:    76.9 kg  Height:        Intake/Output Summary (Last 24 hours) at 09/18/2020 0818 Last data filed at 09/18/2020 0800 Gross per 24 hour  Intake 553.06 ml  Output 1100 ml  Net -546.94 ml   Last 3 Weights 09/18/2020 09/17/2020 09/17/2020  Weight (lbs) 169 lb 8.5 oz 169 lb 8.5 oz 174 lb 6.1 oz  Weight (kg) 76.9 kg 76.9 kg 79.1 kg      Telemetry    NSR. Was in Afib with RVR but converted at 2300 last night - Personally Reviewed  ECG    NSR with LVH, old anteroseptal infarct. - Personally Reviewed  Physical Exam   GEN: thin, elderly WM No acute distress.   Neck: No JVD Cardiac: RRR, no murmurs, rubs, or gallops.  Respiratory: Clear to auscultation bilaterally. GI: Soft, nontender, non-distended. Has some type of prosthesis in  right groin. MS: No edema; No deformity. AV fistula left upper arm. Pulses 2+= Neuro:  Nonfocal  Psych: Normal affect   Labs    High Sensitivity Troponin:   Recent Labs  Lab 09/15/20 1526 09/15/20 1806 09/18/20 0216 09/18/20 0458  TROPONINIHS 96* 87* 103* 110*      Chemistry Recent Labs  Lab 09/15/20 1526 09/15/20 2025 09/16/20 0335 09/17/20 0319 09/18/20 0107  NA 140 137 135 137 138  K 3.2* 3.4* 3.7 3.4* 3.3*  CL 101 99 99 99 99  CO2 27 24 25 28 28   GLUCOSE 82 90 81 99 141*  BUN 29* 31* 34* 18 12  CREATININE 7.10* 7.61* 7.93* 4.86* 3.54*  CALCIUM 8.3* 8.0* 8.0* 8.0* 8.2*  PROT 6.4*  --   --   --   --   ALBUMIN 3.4* 2.8*  --   --   --   AST 25  --   --   --   --   ALT 14  --   --   --   --   ALKPHOS 108  --   --   --   --   BILITOT 1.0  --   --   --   --   GFRNONAA 7* 7* 6* 12* 17*  ANIONGAP 12 14 11  10  11     Hematology Recent Labs  Lab 09/16/20 0335 09/17/20 0319 09/18/20 0107  WBC 7.3 6.9 5.0  RBC 2.88* 3.07* 3.05*  HGB 9.8* 10.3* 10.2*  HCT 30.9* 32.1* 31.8*  MCV 107.3* 104.6* 104.3*  MCH 34.0 33.6 33.4  MCHC 31.7 32.1 32.1  RDW 16.8* 16.7* 16.2*  PLT 87* 88* 87*    BNP Recent Labs  Lab 09/15/20 1618  BNP >4,500.0*     DDimer No results for input(s): DDIMER in the last 168 hours.   Radiology    ECHOCARDIOGRAM COMPLETE  Result Date: 09/16/2020    ECHOCARDIOGRAM REPORT   Patient Name:   Ricky Harris Date of Exam: 09/16/2020 Medical Rec #:  [id number]      Height:       72.0 in Accession #:    [id number]     Weight:       169.8 lb Date of Birth:  December 21, 1940     BSA:          1.987 m Patient Age:    50 years       BP:           118/59 mmHg Patient Gender: M              HR:           96 bpm. Exam Location:  Inpatient Procedure: 2D Echo Indications:    acute diastolic chf  History:        Patient has prior history of Echocardiogram examinations, most                 recent 09/24/2019. Abdominal aortic aneurysm, Chronic kidney                  disease. Cirrhosis, Signs/Symptoms:elevated troponin; Risk                 Factors:Hypertension.  Sonographer:    Johny Chess Referring Phys: [id number] [place]  1. Left ventricular ejection fraction, by estimation, is 25 to 30%. The left ventricle has severely decreased function. The left ventricle demonstrates global hypokinesis. Left ventricular diastolic parameters are consistent with Grade I diastolic dysfunction (impaired relaxation).  2. Right ventricular systolic function is mildly reduced. The right ventricular size is normal. There is normal pulmonary artery systolic pressure. The estimated right ventricular systolic pressure is 76.1 mmHg.  3. Left atrial size was moderately dilated.  4. Right atrial size was mildly dilated.  5. The mitral valve is normal in structure. Trivial mitral valve regurgitation. No evidence of mitral stenosis. Moderate mitral annular calcification.  6. The aortic valve is tricuspid. Aortic valve regurgitation is not visualized. Mild to moderate aortic valve sclerosis/calcification is present, without any evidence of aortic stenosis.  7. Aortic dilatation noted. There is mild dilatation of the ascending aorta, measuring 40 mm.  8. The inferior vena cava is dilated in size with >50% respiratory variability, suggesting right atrial pressure of 8 mmHg. FINDINGS  Left Ventricle: Left ventricular ejection fraction, by estimation, is 25 to 30%. The left ventricle has severely decreased function. The left ventricle demonstrates global hypokinesis. The left ventricular internal cavity size was normal in size. There is no left ventricular hypertrophy. Left ventricular diastolic parameters are consistent with Grade I diastolic dysfunction (impaired relaxation). Right Ventricle: The right ventricular size is normal. No increase in right ventricular wall thickness. Right ventricular systolic function is mildly reduced. There is normal pulmonary artery systolic pressure.  The tricuspid regurgitant  velocity is 1.61 m/s, and with an assumed right atrial pressure of 8 mmHg, the estimated right ventricular systolic pressure is 87.8 mmHg. Left Atrium: Left atrial size was moderately dilated. Right Atrium: Right atrial size was mildly dilated. Pericardium: There is no evidence of pericardial effusion. Mitral Valve: The mitral valve is normal in structure. Moderate mitral annular calcification. Trivial mitral valve regurgitation. No evidence of mitral valve stenosis. Tricuspid Valve: The tricuspid valve is normal in structure. Tricuspid valve regurgitation is trivial. Aortic Valve: The aortic valve is tricuspid. Aortic valve regurgitation is not visualized. Mild to moderate aortic valve sclerosis/calcification is present, without any evidence of aortic stenosis. Pulmonic Valve: The pulmonic valve was normal in structure. Pulmonic valve regurgitation is not visualized. Aorta: The aortic root is normal in size and structure and aortic dilatation noted. There is mild dilatation of the ascending aorta, measuring 40 mm. Venous: The inferior vena cava is dilated in size with greater than 50% respiratory variability, suggesting right atrial pressure of 8 mmHg. IAS/Shunts: No atrial level shunt detected by color flow Doppler.  LEFT VENTRICLE PLAX 2D LVIDd:         5.60 cm      Diastology LVIDs:         4.40 cm      LV e' medial:    5.00 cm/s LV PW:         1.00 cm      LV E/e' medial:  19.5 LV IVS:        0.90 cm      LV e' lateral:   10.10 cm/s LVOT diam:     2.20 cm      LV E/e' lateral: 9.7 LV SV:         92 LV SV Index:   46 LVOT Area:     3.80 cm  LV Volumes (MOD) LV vol d, MOD A2C: 154.0 ml LV vol d, MOD A4C: 199.0 ml LV vol s, MOD A2C: 107.0 ml LV vol s, MOD A4C: 137.0 ml LV SV MOD A2C:     47.0 ml LV SV MOD A4C:     199.0 ml LV SV MOD BP:      60.9 ml RIGHT VENTRICLE             IVC RV S prime:     13.30 cm/s  IVC diam: 2.40 cm TAPSE (M-mode): 2.4 cm LEFT ATRIUM              Index        RIGHT ATRIUM           Index LA diam:        5.10 cm  2.57 cm/m  RA Area:     21.90 cm LA Vol (A2C):   101.0 ml 50.83 ml/m RA Volume:   66.60 ml  33.52 ml/m LA Vol (A4C):   93.2 ml  46.90 ml/m LA Biplane Vol: 97.0 ml  48.81 ml/m  AORTIC VALVE LVOT Vmax:   125.00 cm/s LVOT Vmean:  85.200 cm/s LVOT VTI:    0.241 m  AORTA Ao Root diam: 3.50 cm Ao Asc diam:  4.00 cm MITRAL VALVE                TRICUSPID VALVE MV Area (PHT): 4.96 cm     TR Peak grad:   10.4 mmHg MV Decel Time: 153 msec     TR Vmax:        161.00 cm/s MV E velocity: 97.50  cm/s MV A velocity: 112.00 cm/s  SHUNTS MV E/A ratio:  0.87         Systemic VTI:  0.24 m                             Systemic Diam: 2.20 cm Loralie Champagne MD Electronically signed by Loralie Champagne MD Signature Date/Time: 09/16/2020/5:29:59 PM    Final     Cardiac Studies   ECHO: 09/16/20 IMPRESSIONS     1. Left ventricular ejection fraction, by estimation, is 25 to 30%. The  left ventricle has severely decreased function. The left ventricle  demonstrates global hypokinesis. Left ventricular diastolic parameters are  consistent with Grade I diastolic  dysfunction (impaired relaxation).   2. Right ventricular systolic function is mildly reduced. The right  ventricular size is normal. There is normal pulmonary artery systolic  pressure. The estimated right ventricular systolic pressure is 10.2 mmHg.   3. Left atrial size was moderately dilated.   4. Right atrial size was mildly dilated.   5. The mitral valve is normal in structure. Trivial mitral valve  regurgitation. No evidence of mitral stenosis. Moderate mitral annular  calcification.   6. The aortic valve is tricuspid. Aortic valve regurgitation is not  visualized. Mild to moderate aortic valve sclerosis/calcification is  present, without any evidence of aortic stenosis.   7. Aortic dilatation noted. There is mild dilatation of the ascending  aorta, measuring 40 mm.   8. The inferior vena cava is dilated  in size with >50% respiratory  variability, suggesting right atrial pressure of 8 mmHg.  Patient Profile     80 y.o. 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    Assessment & Plan    Atrial fibrillation with RVR. Now converted to NSR. First episode documented. [place] vasc score is 5. Currently on Toprol XL. On ASA. Will need to evaluate ischemic risk. Consider anticoagulation with Eliquis but will need to keep in mind high bleeding risk with history of ESRD, cirrhosis and multiple esophageal banding procedures.  Acute systolic CHF. Severe LV and RV dysfunction noted on Echo. EF 25-30% which is new compared to Echo one year ago. Appears euvolemic with dialysis. Recommend right and left heart cath today to assess hemodynamics and ischemic risk. The procedure and risks were reviewed including but not limited to death, myocardial infarction, stroke, arrythmias, bleeding, transfusion, emergency surgery, dye allergy, or renal dysfunction. The patient voices understanding and is agreeable to proceed. Chest pain. Troponin negative. High risk for CAD. Will assess with cath today ESRD on HD. HTN HLD. Recommend high dose statin. S/p stent graft for AAA History of cirrhosis. 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       For questions or updates, please contact [place] Please consult [email or web address] for contact info under        Signed, Gerson Fauth [place], MD  09/18/2020, 8:18 AM

## 2020-09-18 NOTE — Progress Notes (Signed)
Progress Note  Patient Name: Ricky Harris Date of Encounter: 09/18/2020  [facility] Cardiologist: None   Subjective   No chest pain this morning.   Inpatient Medications    Scheduled Meds:  aspirin EC  81 mg Oral Daily   atorvastatin  80 mg Oral Daily   Chlorhexidine Gluconate Cloth  6 each Topical Q0600   lidocaine  1 patch Transdermal Q24H   metoprolol succinate  25 mg Oral Daily   Continuous Infusions:  sodium chloride     sodium chloride     sodium chloride     ampicillin-sulbactam (UNASYN) IV 200 mL/hr at 09/17/20 1557   heparin 1,100 Units/hr (09/18/20 0228)   PRN Meds: sodium chloride, sodium chloride, sodium chloride, acetaminophen, alteplase, fentaNYL (SUBLIMAZE) injection, heparin, lidocaine (PF), lidocaine-prilocaine, ondansetron (ZOFRAN) IV, pentafluoroprop-tetrafluoroeth   Vital Signs    Vitals:   09/17/20 2307 09/18/20 0101 09/18/20 0256 09/18/20 0510  BP: 105/65 (!) 100/58 106/64 105/65  Pulse: (!) 103 (!) 101 96 88  Resp: 18 20 16 16   Temp: 98.4 F (36.9 C) 98.2 F (36.8 C) 98.8 F (37.1 C) 98.7 F (37.1 C)  TempSrc: Oral Oral Oral Oral  SpO2: 97% 95% 92% 94%  Weight:    76.9 kg  Height:        Intake/Output Summary (Last 24 hours) at 09/18/2020 0807 Last data filed at 09/18/2020 0800 Gross per 24 hour  Intake 553.06 ml  Output 1100 ml  Net -546.94 ml   Last 3 Weights 09/18/2020 09/17/2020 09/17/2020  Weight (lbs) 169 lb 8.5 oz 169 lb 8.5 oz 174 lb 6.1 oz  Weight (kg) 76.9 kg 76.9 kg 79.1 kg      Telemetry    Afib RVR last evening, SR this morning - Personally Reviewed  ECG    NSR, 85 bpm with ST elevation v2-v3 - Personally Reviewed  Physical Exam   GEN: No acute distress.   Neck: No JVD Cardiac: RRR, no murmurs, rubs, or gallops.  Respiratory: Clear to auscultation bilaterally. GI: Soft, nontender, non-distended  MS: No edema; No deformity. Left UE fistula Neuro:  Nonfocal  Psych: Normal affect   Labs    High  Sensitivity Troponin:   Recent Labs  Lab 09/15/20 1526 09/15/20 1806 09/18/20 0216 09/18/20 0458  TROPONINIHS 96* 87* 103* 110*      Chemistry Recent Labs  Lab 09/15/20 1526 09/15/20 2025 09/16/20 0335 09/17/20 0319 09/18/20 0107  NA 140 137 135 137 138  K 3.2* 3.4* 3.7 3.4* 3.3*  CL 101 99 99 99 99  CO2 27 24 25 28 28   GLUCOSE 82 90 81 99 141*  BUN 29* 31* 34* 18 12  CREATININE 7.10* 7.61* 7.93* 4.86* 3.54*  CALCIUM 8.3* 8.0* 8.0* 8.0* 8.2*  PROT 6.4*  --   --   --   --   ALBUMIN 3.4* 2.8*  --   --   --   AST 25  --   --   --   --   ALT 14  --   --   --   --   ALKPHOS 108  --   --   --   --   BILITOT 1.0  --   --   --   --   GFRNONAA 7* 7* 6* 12* 17*  ANIONGAP 12 14 11 10 11      Hematology Recent Labs  Lab 09/16/20 0335 09/17/20 0319 09/18/20 0107  WBC 7.3 6.9 5.0  RBC 2.88*  3.07* 3.05*  HGB 9.8* 10.3* 10.2*  HCT 30.9* 32.1* 31.8*  MCV 107.3* 104.6* 104.3*  MCH 34.0 33.6 33.4  MCHC 31.7 32.1 32.1  RDW 16.8* 16.7* 16.2*  PLT 87* 88* 87*    BNP Recent Labs  Lab 09/15/20 1618  BNP >4,500.0*     DDimer No results for input(s): DDIMER in the last 168 hours.   Radiology    ECHOCARDIOGRAM COMPLETE  Result Date: 09/16/2020    ECHOCARDIOGRAM REPORT   Patient Name:   Ricky Harris Date of Exam: 09/16/2020 Medical Rec #:  [id number]      Height:       72.0 in Accession #:    [id number]     Weight:       169.8 lb Date of Birth:  03-23-1940     BSA:          1.987 m Patient Age:    80 years       BP:           118/59 mmHg Patient Gender: M              HR:           96 bpm. Exam Location:  Inpatient Procedure: 2D Echo Indications:    acute diastolic chf  History:        Patient has prior history of Echocardiogram examinations, most                 recent 09/24/2019. Abdominal aortic aneurysm, Chronic kidney                 disease. Cirrhosis, Signs/Symptoms:elevated troponin; Risk                 Factors:Hypertension.  Sonographer:    Johny Chess Referring  Phys: [id number] [place]  1. Left ventricular ejection fraction, by estimation, is 25 to 30%. The left ventricle has severely decreased function. The left ventricle demonstrates global hypokinesis. Left ventricular diastolic parameters are consistent with Grade I diastolic dysfunction (impaired relaxation).  2. Right ventricular systolic function is mildly reduced. The right ventricular size is normal. There is normal pulmonary artery systolic pressure. The estimated right ventricular systolic pressure is 68.3 mmHg.  3. Left atrial size was moderately dilated.  4. Right atrial size was mildly dilated.  5. The mitral valve is normal in structure. Trivial mitral valve regurgitation. No evidence of mitral stenosis. Moderate mitral annular calcification.  6. The aortic valve is tricuspid. Aortic valve regurgitation is not visualized. Mild to moderate aortic valve sclerosis/calcification is present, without any evidence of aortic stenosis.  7. Aortic dilatation noted. There is mild dilatation of the ascending aorta, measuring 40 mm.  8. The inferior vena cava is dilated in size with >50% respiratory variability, suggesting right atrial pressure of 8 mmHg. FINDINGS  Left Ventricle: Left ventricular ejection fraction, by estimation, is 25 to 30%. The left ventricle has severely decreased function. The left ventricle demonstrates global hypokinesis. The left ventricular internal cavity size was normal in size. There is no left ventricular hypertrophy. Left ventricular diastolic parameters are consistent with Grade I diastolic dysfunction (impaired relaxation). Right Ventricle: The right ventricular size is normal. No increase in right ventricular wall thickness. Right ventricular systolic function is mildly reduced. There is normal pulmonary artery systolic pressure. The tricuspid regurgitant velocity is 1.61 m/s, and with an assumed right atrial pressure of 8 mmHg, the estimated right ventricular systolic  pressure is 41.9 mmHg. Left  Atrium: Left atrial size was moderately dilated. Right Atrium: Right atrial size was mildly dilated. Pericardium: There is no evidence of pericardial effusion. Mitral Valve: The mitral valve is normal in structure. Moderate mitral annular calcification. Trivial mitral valve regurgitation. No evidence of mitral valve stenosis. Tricuspid Valve: The tricuspid valve is normal in structure. Tricuspid valve regurgitation is trivial. Aortic Valve: The aortic valve is tricuspid. Aortic valve regurgitation is not visualized. Mild to moderate aortic valve sclerosis/calcification is present, without any evidence of aortic stenosis. Pulmonic Valve: The pulmonic valve was normal in structure. Pulmonic valve regurgitation is not visualized. Aorta: The aortic root is normal in size and structure and aortic dilatation noted. There is mild dilatation of the ascending aorta, measuring 40 mm. Venous: The inferior vena cava is dilated in size with greater than 50% respiratory variability, suggesting right atrial pressure of 8 mmHg. IAS/Shunts: No atrial level shunt detected by color flow Doppler.  LEFT VENTRICLE PLAX 2D LVIDd:         5.60 cm      Diastology LVIDs:         4.40 cm      LV e' medial:    5.00 cm/s LV PW:         1.00 cm      LV E/e' medial:  19.5 LV IVS:        0.90 cm      LV e' lateral:   10.10 cm/s LVOT diam:     2.20 cm      LV E/e' lateral: 9.7 LV SV:         92 LV SV Index:   46 LVOT Area:     3.80 cm  LV Volumes (MOD) LV vol d, MOD A2C: 154.0 ml LV vol d, MOD A4C: 199.0 ml LV vol s, MOD A2C: 107.0 ml LV vol s, MOD A4C: 137.0 ml LV SV MOD A2C:     47.0 ml LV SV MOD A4C:     199.0 ml LV SV MOD BP:      60.9 ml RIGHT VENTRICLE             IVC RV S prime:     13.30 cm/s  IVC diam: 2.40 cm TAPSE (M-mode): 2.4 cm LEFT ATRIUM              Index       RIGHT ATRIUM           Index LA diam:        5.10 cm  2.57 cm/m  RA Area:     21.90 cm LA Vol (A2C):   101.0 ml 50.83 ml/m RA Volume:   66.60  ml  33.52 ml/m LA Vol (A4C):   93.2 ml  46.90 ml/m LA Biplane Vol: 97.0 ml  48.81 ml/m  AORTIC VALVE LVOT Vmax:   125.00 cm/s LVOT Vmean:  85.200 cm/s LVOT VTI:    0.241 m  AORTA Ao Root diam: 3.50 cm Ao Asc diam:  4.00 cm MITRAL VALVE                TRICUSPID VALVE MV Area (PHT): 4.96 cm     TR Peak grad:   10.4 mmHg MV Decel Time: 153 msec     TR Vmax:        161.00 cm/s MV E velocity: 97.50 cm/s MV A velocity: 112.00 cm/s  SHUNTS MV E/A ratio:  0.87         Systemic VTI:  0.24 m                             Systemic Diam: 2.20 cm Loralie Champagne MD Electronically signed by Loralie Champagne MD Signature Date/Time: 09/16/2020/5:29:59 PM    Final     Cardiac Studies   Echo: 09/16/20  IMPRESSIONS     1. Left ventricular ejection fraction, by estimation, is 25 to 30%. The  left ventricle has severely decreased function. The left ventricle  demonstrates global hypokinesis. Left ventricular diastolic parameters are  consistent with Grade I diastolic  dysfunction (impaired relaxation).   2. Right ventricular systolic function is mildly reduced. The right  ventricular size is normal. There is normal pulmonary artery systolic  pressure. The estimated right ventricular systolic pressure is 02.4 mmHg.   3. Left atrial size was moderately dilated.   4. Right atrial size was mildly dilated.   5. The mitral valve is normal in structure. Trivial mitral valve  regurgitation. No evidence of mitral stenosis. Moderate mitral annular  calcification.   6. The aortic valve is tricuspid. Aortic valve regurgitation is not  visualized. Mild to moderate aortic valve sclerosis/calcification is  present, without any evidence of aortic stenosis.   7. Aortic dilatation noted. There is mild dilatation of the ascending  aorta, measuring 40 mm.   8. The inferior vena cava is dilated in size with >50% respiratory  variability, suggesting right atrial pressure of 8 mmHg.   Patient Profile     80 y.o. 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 Also noted to have ST changes on EKG.   Assessment & Plan    Acute combined CHF: Echo this admission with decline in EF to 25-30% from 60% back in 2020 (noted in care everywhere). He has intermittently followed with cardiology through [facility] with stress testing in 2019. Will need ischemic work up with [facility] [place] to assess for CAD.  -- volume management with HD -- GDMT limited with soft BPs and ESRD. Currently on Toprol XL 25 mg daily  Afib RVR: developed episode while in HD last evening. Felt a fluttering sensation in his chest. Brief episode, then back into SR.  -- currently on IV heparin -- This patients CHA2DS2-VASc Score of at least 5, therefore will need address [place] post cath -- tolerating low dose Toprol  Abnormal EKG with mildly elevated troponin: noted to have q waves in anteroseptal leads which were more pronounced in v2-v3. He denies any chest pain but does have CRFs. With new decline in EF have recommended that he undergo cardiac cath. hsTn mildly elevated in the setting of ESRD.  -- The patient understands that risks included but are not limited to stroke (1 in 1000), death (1 in 28), kidney failure [usually temporary] (1 in 500), bleeding (1 in 200), allergic reaction [possibly serious] (1 in 200).   -- on ASA, statin, BB, IV heparin  ESRD on HD: TTS schedule. Per nephrology  Hx of AAA: s/p endovascular repair in 2020. CT showed patent aortic stent graft with 6.1cm native sac diameter which had previously been 5.9cm. May need to review with [facility]  Anemia: baseline hgb 9-10  For questions or updates, please contact [place] Please consult [email or web address] for contact info under  Signed, Reino Bellis, NP  09/18/2020, 8:07 AM

## 2020-09-18 NOTE — Plan of Care (Signed)
  Problem: Education: Goal: Knowledge of General Education information will improve Description: Including pain rating scale, medication(s)/side effects and non-pharmacologic comfort measures Outcome: Progressing   Problem: Health Behavior/Discharge Planning: Goal: Ability to manage health-related needs will improve Outcome: Progressing   Problem: Clinical Measurements: Goal: Will remain free from infection Outcome: Progressing Goal: Respiratory complications will improve Outcome: Progressing   Problem: Fluid Volume: Goal: Compliance with measures to maintain balanced fluid volume will improve Outcome: Progressing

## 2020-09-18 NOTE — Progress Notes (Signed)
ANTICOAGULATION CONSULT NOTE - Initial Consult  Pharmacy Consult for heparin Indication:  Afib and r/o ACS  Allergies  Allergen Reactions   Cephalexin Hives   Hydralazine Hives    Patient Measurements: Height: 6' (182.9 cm) Weight: 76.9 kg (169 lb 8.5 oz) IBW/kg (Calculated) : 77.6  Vital Signs: Temp: 98.2 F (36.8 C) (07/22 0101) Temp Source: Oral (07/22 0101) BP: 100/58 (07/22 0101) Pulse Rate: 101 (07/22 0101)  Labs: Recent Labs    09/15/20 1526 09/15/20 1806 09/15/20 2025 09/16/20 0335 09/17/20 0319  HGB 10.2*  --  9.8* 9.8* 10.3*  HCT 33.2*  --  30.4* 30.9* 32.1*  PLT 90*  --  91* 87* 88*  LABPROT 15.3*  --   --   --   --   INR 1.2  --   --   --   --   CREATININE 7.10*  --  7.61* 7.93* 4.86*  TROPONINIHS 96* 87*  --   --   --     Estimated Creatinine Clearance: 13.6 mL/min (A) (by C-G formula based on SCr of 4.86 mg/dL (H)).   Medical History: Past Medical History:  Diagnosis Date   A-fib ([place]) 09/18/2020   AAA (abdominal aortic aneurysm) ([place])    5.3cm , magd by vascular Dr Scot Dock ,   Anemia    " slightly"   Aortic stenosis    Arthritis    knees   Ascites    Cancer ([place])    prostate   Chronic kidney disease    stage 4, mgd by Dr Justin Mend    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[facility])    Coronary artery disease    on CT scan   Gout    H/O hypercholesterolemia    Heart murmur    History of falling    recent- see ct chest results of 04/25/2018   Hypertension    Hypothyroidism    Liver cirrhosis ([place])    Prediabetes    pt denies    Renal artery stenosis ([facility])    Thrombocytopenia ([facility])    Wears glasses    Wears glasses     Medications:  Medications Prior to Admission  Medication Sig Dispense Refill Last Dose   amLODipine (NORVASC) 5 MG tablet Take 5 mg by mouth daily.   09/15/2020   atorvastatin (LIPITOR) 10 MG tablet Take 10 mg by mouth daily.   09/15/2020   carvedilol (COREG) 12.5 MG tablet Take 12.5 mg by mouth 2 (two) times daily.    09/15/2020 at 0900   febuxostat (ULORIC) 40 MG tablet Take 1 tablet (40 mg total) by mouth daily. 30 tablet 3 Past Month   lactulose (CHRONULAC) 10 GM/15ML solution Take 15 mLs (10 g total) by mouth daily. 236 mL 1 Past Month   multivitamin (RENA-VIT) TABS tablet Take 1 tablet by mouth daily. 30 tablet 0 09/15/2020   calcitRIOL (ROCALTROL) 0.25 MCG capsule Take 1 capsule (0.25 mcg total) by mouth every Tuesday, Thursday, and Saturday at 6 PM. (Patient not taking: No sig reported) 30 capsule 3 Not Taking   calcium acetate (PHOSLO) 667 MG capsule Take 1 capsule (667 mg total) by mouth 3 (three) times daily with meals. 90 capsule 2    cloNIDine (CATAPRES) 0.1 MG tablet Take 1 tablet (0.1 mg total) by mouth daily. (Patient not taking: No sig reported) 30 tablet 0 Not Taking   pantoprazole (PROTONIX) 40 MG tablet Take 1 tablet (40 mg total) by mouth daily. 30 tablet 1  traMADol (ULTRAM) 50 MG tablet Take 1 tablet (50 mg total) by mouth every 8 (eight) hours as needed for moderate pain. (Patient not taking: Reported on 09/17/2020) 8 tablet 0 Not Taking   Scheduled:   aspirin  325 mg Oral Daily   Chlorhexidine Gluconate Cloth  6 each Topical Q0600   lidocaine  1 patch Transdermal Q24H   metoprolol succinate  25 mg Oral Daily   Infusions:   sodium chloride     sodium chloride     sodium chloride     ampicillin-sulbactam (UNASYN) IV 200 mL/hr at 09/17/20 1557    Assessment: 80yo male admitted for acute on chronic CHF with plan to discharge soon now w/ episode of Afib overnight, ECG done that revealed new ST elevations though no anginal sx, to begin heparin while awaiting possible cath.  Goal of Therapy:  Heparin level 0.3-0.7 units/ml Monitor platelets by anticoagulation protocol: Yes   Plan:  Rec'd UFH 5000 units SQ 1h ago so will defer bolus and start heparin infusion at 1100 units/hr and monitor heparin levels and CBC.  Wynona Neat, PharmD, BCPS  09/18/2020,1:15 AM

## 2020-09-18 NOTE — Progress Notes (Addendum)
[facility] Progress Note   Subjective:  Seen in room - on heparin drip with plan for heart cath today to eval for ischemia given new systolic HF and A-fib RVR. Dialyzed last night - did fine, 1.1L net UF. No CP or dyspnea at the moment.  Objective Vitals:   09/18/20 0101 09/18/20 0256 09/18/20 0510 09/18/20 0910  BP: (!) 100/58 106/64 105/65 101/63  Pulse: (!) 101 96 88 84  Resp: 20 16 16 18   Temp: 98.2 F (36.8 C) 98.8 F (37.1 C) 98.7 F (37.1 C) 98.1 F (36.7 C)  TempSrc: Oral Oral Oral Oral  SpO2: 95% 92% 94% 99%  Weight:   76.9 kg   Height:       Physical Exam General: Well appearing man, NAD. Room air. Heart: RRR; no murmur Lungs: CTAB Abdomen: soft, non-tender Extremities: No LE edema Dialysis Access: L AVF + bruit  Additional Objective Labs: Basic Metabolic Panel: Recent Labs  Lab 09/15/20 2025 09/16/20 0335 09/17/20 0319 09/18/20 0107  NA 137 135 137 138  K 3.4* 3.7 3.4* 3.3*  CL 99 99 99 99  CO2 24 25 28 28   GLUCOSE 90 81 99 141*  BUN 31* 34* 18 12  CREATININE 7.61* 7.93* 4.86* 3.54*  CALCIUM 8.0* 8.0* 8.0* 8.2*  PHOS 4.2  --   --   --    Liver Function Tests: Recent Labs  Lab 09/15/20 1526 09/15/20 2025  AST 25  --   ALT 14  --   ALKPHOS 108  --   BILITOT 1.0  --   PROT 6.4*  --   ALBUMIN 3.4* 2.8*   Recent Labs  Lab 09/15/20 1526  LIPASE 27   CBC: Recent Labs  Lab 09/15/20 1526 09/15/20 2025 09/16/20 0335 09/17/20 0319 09/18/20 0107  WBC 6.7 8.2 7.3 6.9 5.0  NEUTROABS 5.8  --   --   --   --   HGB 10.2* 9.8* 9.8* 10.3* 10.2*  HCT 33.2* 30.4* 30.9* 32.1* 31.8*  MCV 108.1* 105.6* 107.3* 104.6* 104.3*  PLT 90* 91* 87* 88* 87*  Studies/Results: ECHOCARDIOGRAM COMPLETE  Result Date: 09/16/2020    ECHOCARDIOGRAM REPORT   Patient Name:   Ricky Harris Date of Exam: 09/16/2020 Medical Rec #:  [id number]      Height:       72.0 in Accession #:    [id number]     Weight:       169.8 lb Date of Birth:  Apr 30, 1940      BSA:          1.987 m Patient Age:    80 years       BP:           118/59 mmHg Patient Gender: M              HR:           96 bpm. Exam Location:  Inpatient Procedure: 2D Echo Indications:    acute diastolic chf  History:        Patient has prior history of Echocardiogram examinations, most                 recent 09/24/2019. Abdominal aortic aneurysm, Chronic kidney                 disease. Cirrhosis, Signs/Symptoms:elevated troponin; Risk                 Factors:Hypertension.  Sonographer:    Johny Chess Referring  Phys: [id number] TIMOTHY S OPYD IMPRESSIONS  1. Left ventricular ejection fraction, by estimation, is 25 to 30%. The left ventricle has severely decreased function. The left ventricle demonstrates global hypokinesis. Left ventricular diastolic parameters are consistent with Grade I diastolic dysfunction (impaired relaxation).  2. Right ventricular systolic function is mildly reduced. The right ventricular size is normal. There is normal pulmonary artery systolic pressure. The estimated right ventricular systolic pressure is 69.6 mmHg.  3. Left atrial size was moderately dilated.  4. Right atrial size was mildly dilated.  5. The mitral valve is normal in structure. Trivial mitral valve regurgitation. No evidence of mitral stenosis. Moderate mitral annular calcification.  6. The aortic valve is tricuspid. Aortic valve regurgitation is not visualized. Mild to moderate aortic valve sclerosis/calcification is present, without any evidence of aortic stenosis.  7. Aortic dilatation noted. There is mild dilatation of the ascending aorta, measuring 40 mm.  8. The inferior vena cava is dilated in size with >50% respiratory variability, suggesting right atrial pressure of 8 mmHg. FINDINGS  Left Ventricle: Left ventricular ejection fraction, by estimation, is 25 to 30%. The left ventricle has severely decreased function. The left ventricle demonstrates global hypokinesis. The left ventricular internal cavity size  was normal in size. There is no left ventricular hypertrophy. Left ventricular diastolic parameters are consistent with Grade I diastolic dysfunction (impaired relaxation). Right Ventricle: The right ventricular size is normal. No increase in right ventricular wall thickness. Right ventricular systolic function is mildly reduced. There is normal pulmonary artery systolic pressure. The tricuspid regurgitant velocity is 1.61 m/s, and with an assumed right atrial pressure of 8 mmHg, the estimated right ventricular systolic pressure is 78.9 mmHg. Left Atrium: Left atrial size was moderately dilated. Right Atrium: Right atrial size was mildly dilated. Pericardium: There is no evidence of pericardial effusion. Mitral Valve: The mitral valve is normal in structure. Moderate mitral annular calcification. Trivial mitral valve regurgitation. No evidence of mitral valve stenosis. Tricuspid Valve: The tricuspid valve is normal in structure. Tricuspid valve regurgitation is trivial. Aortic Valve: The aortic valve is tricuspid. Aortic valve regurgitation is not visualized. Mild to moderate aortic valve sclerosis/calcification is present, without any evidence of aortic stenosis. Pulmonic Valve: The pulmonic valve was normal in structure. Pulmonic valve regurgitation is not visualized. Aorta: The aortic root is normal in size and structure and aortic dilatation noted. There is mild dilatation of the ascending aorta, measuring 40 mm. Venous: The inferior vena cava is dilated in size with greater than 50% respiratory variability, suggesting right atrial pressure of 8 mmHg. IAS/Shunts: No atrial level shunt detected by color flow Doppler.  LEFT VENTRICLE PLAX 2D LVIDd:         5.60 cm      Diastology LVIDs:         4.40 cm      LV e' medial:    5.00 cm/s LV PW:         1.00 cm      LV E/e' medial:  19.5 LV IVS:        0.90 cm      LV e' lateral:   10.10 cm/s LVOT diam:     2.20 cm      LV E/e' lateral: 9.7 LV SV:         92 LV SV  Index:   46 LVOT Area:     3.80 cm  LV Volumes (MOD) LV vol d, MOD A2C: 154.0 ml LV vol d, MOD A4C: 199.0  ml LV vol s, MOD A2C: 107.0 ml LV vol s, MOD A4C: 137.0 ml LV SV MOD A2C:     47.0 ml LV SV MOD A4C:     199.0 ml LV SV MOD BP:      60.9 ml RIGHT VENTRICLE             IVC RV S prime:     13.30 cm/s  IVC diam: 2.40 cm TAPSE (M-mode): 2.4 cm LEFT ATRIUM              Index       RIGHT ATRIUM           Index LA diam:        5.10 cm  2.57 cm/m  RA Area:     21.90 cm LA Vol (A2C):   101.0 ml 50.83 ml/m RA Volume:   66.60 ml  33.52 ml/m LA Vol (A4C):   93.2 ml  46.90 ml/m LA Biplane Vol: 97.0 ml  48.81 ml/m  AORTIC VALVE LVOT Vmax:   125.00 cm/s LVOT Vmean:  85.200 cm/s LVOT VTI:    0.241 m  AORTA Ao Root diam: 3.50 cm Ao Asc diam:  4.00 cm MITRAL VALVE                TRICUSPID VALVE MV Area (PHT): 4.96 cm     TR Peak grad:   10.4 mmHg MV Decel Time: 153 msec     TR Vmax:        161.00 cm/s MV E velocity: 97.50 cm/s MV A velocity: 112.00 cm/s  SHUNTS MV E/A ratio:  0.87         Systemic VTI:  0.24 m                             Systemic Diam: 2.20 cm Loralie Champagne MD Electronically signed by Loralie Champagne MD Signature Date/Time: 09/16/2020/5:29:59 PM    Final    Medications:  sodium chloride     sodium chloride     sodium chloride     sodium chloride     [START ON 09/19/2020] sodium chloride     ampicillin-sulbactam (UNASYN) IV 200 mL/hr at 09/17/20 1557   heparin 1,100 Units/hr (09/18/20 0228)    aspirin EC  81 mg Oral Daily   atorvastatin  80 mg Oral Daily   Chlorhexidine Gluconate Cloth  6 each Topical Q0600   lidocaine  1 patch Transdermal Q24H   metoprolol succinate  25 mg Oral Daily   sodium chloride flush  3 mL Intravenous Q12H    Dialysis Orders: TTS [place] 4hr, 350/500, EDW 77.5kg, 3K/2.5Ca, AVF, heparin 3000 bolus - Venofer 50mg  IV q week - Calcitriol 0.40mcg PO q HD   Assessment/Plan:  Chest pain/B pleural effusions/pneumonia: CTA neg for PE/dissection. S/p extra HD on 7/20.  On Unasyn for pneumonia coverage.   ESRD: Usual TTS schedule , s/p extra HD 7/20. Next HD on Sat 7/23.  Hypertension/volume: BP low/normal, on metoprolol for now.  Anemia: Hgb 10.2 - follow without ESA for now.  Metabolic bone disease: Ca/Phos ok - continue home binders/VDRA.  Cirrhosis/thrombocytopenia  Elevated Trop: Follow trends  AAA/new ascending thoracic AA: For OP follow-up per notes.  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  New A-Fib: Ischemic w/u ongoing.  Veneta Penton, PA-C 09/18/2020, 10:35 AM  [facility]  I have  seen and examined this patient and agree with plan and assessment in the above note with renal recommendations/intervention highlighted. ECHO results noted and agree with LHC today.  Plan for HD tomorrow to keep on schedule.  Broadus John A Keyante Durio,MD 09/18/2020 2:16 PM

## 2020-09-19 DIAGNOSIS — I5021 Acute systolic (congestive) heart failure: Secondary | ICD-10-CM

## 2020-09-19 DIAGNOSIS — I25118 Atherosclerotic heart disease of native coronary artery with other forms of angina pectoris: Secondary | ICD-10-CM

## 2020-09-19 DIAGNOSIS — I480 Paroxysmal atrial fibrillation: Secondary | ICD-10-CM

## 2020-09-19 LAB — BASIC METABOLIC PANEL
Anion gap: 12 (ref 5–15)
BUN: 23 mg/dL (ref 8–23)
CO2: 23 mmol/L (ref 22–32)
Calcium: 8 mg/dL — ABNORMAL LOW (ref 8.9–10.3)
Chloride: 99 mmol/L (ref 98–111)
Creatinine, Ser: 5.34 mg/dL — ABNORMAL HIGH (ref 0.61–1.24)
GFR, Estimated: 10 mL/min — ABNORMAL LOW (ref >60)
Glucose, Bld: 91 mg/dL (ref 70–99)
Potassium: 3.9 mmol/L (ref 3.5–5.1)
Sodium: 134 mmol/L — ABNORMAL LOW (ref 135–145)

## 2020-09-19 LAB — LIPID PANEL
Cholesterol: 133 mg/dL (ref 0–200)
HDL: 38 mg/dL — ABNORMAL LOW (ref >40)
LDL Cholesterol: 74 mg/dL (ref 0–99)
Total CHOL/HDL Ratio: 3.5 RATIO
Triglycerides: 103 mg/dL (ref <150)
VLDL: 21 mg/dL (ref 0–40)

## 2020-09-19 LAB — CBC
HCT: 32.1 % — ABNORMAL LOW (ref 39.0–52.0)
Hemoglobin: 10.4 g/dL — ABNORMAL LOW (ref 13.0–17.0)
MCH: 33.9 pg (ref 26.0–34.0)
MCHC: 32.4 g/dL (ref 30.0–36.0)
MCV: 104.6 fL — ABNORMAL HIGH (ref 80.0–100.0)
Platelets: 120 10*3/uL — ABNORMAL LOW (ref 150–400)
RBC: 3.07 MIL/uL — ABNORMAL LOW (ref 4.22–5.81)
RDW: 16.2 % — ABNORMAL HIGH (ref 11.5–15.5)
WBC: 6.1 10*3/uL (ref 4.0–10.5)
nRBC: 0 % (ref 0.0–0.2)

## 2020-09-19 LAB — HEPARIN LEVEL (UNFRACTIONATED)
Heparin Unfractionated: 0.16 IU/mL — ABNORMAL LOW (ref 0.30–0.70)
Heparin Unfractionated: 0.28 IU/mL — ABNORMAL LOW (ref 0.30–0.70)

## 2020-09-19 NOTE — Progress Notes (Signed)
ANTICOAGULATION CONSULT NOTE  Pharmacy Consult for heparin Indication:  Afib and r/o ACS  Allergies  Allergen Reactions   Cephalexin Hives   Hydralazine Hives    Patient Measurements: Height: 6' (182.9 cm) Weight: 74.6 kg (164 lb 6.4 oz) IBW/kg (Calculated) : 77.6  Vital Signs: Temp: 97.7 F (36.5 C) (07/23 1129) Temp Source: Oral (07/23 1129) BP: 115/62 (07/23 1129) Pulse Rate: 79 (07/23 1129)  Labs: Recent Labs    09/17/20 0319 09/18/20 0107 09/18/20 0216 09/18/20 0458 09/18/20 1055 09/18/20 1706 09/18/20 1710 09/19/20 0358 09/19/20 1140  HGB 10.3* 10.2*  --   --   --  9.9*  10.2* 9.9* 10.4*  --   HCT 32.1* 31.8*  --   --   --  29.0*  30.0* 29.0* 32.1*  --   PLT 88* 87*  --   --   --   --   --  120*  --   HEPARINUNFRC  --   --   --   --  0.30  --   --   --  0.16*  CREATININE 4.86* 3.54*  --   --   --   --   --  5.34*  --   TROPONINIHS  --   --  103* 110*  --   --   --   --   --      Estimated Creatinine Clearance: 12 mL/min (A) (by C-G formula based on SCr of 5.34 mg/dL (H)).   Medications:  Medications Prior to Admission  Medication Sig Dispense Refill Last Dose   amLODipine (NORVASC) 5 MG tablet Take 5 mg by mouth daily.   09/15/2020   atorvastatin (LIPITOR) 10 MG tablet Take 10 mg by mouth daily.   09/15/2020   carvedilol (COREG) 12.5 MG tablet Take 12.5 mg by mouth 2 (two) times daily.   09/15/2020 at 0900   febuxostat (ULORIC) 40 MG tablet Take 1 tablet (40 mg total) by mouth daily. 30 tablet 3 Past Month   lactulose (CHRONULAC) 10 GM/15ML solution Take 15 mLs (10 g total) by mouth daily. 236 mL 1 Past Month   multivitamin (RENA-VIT) TABS tablet Take 1 tablet by mouth daily. 30 tablet 0 09/15/2020   calcitRIOL (ROCALTROL) 0.25 MCG capsule Take 1 capsule (0.25 mcg total) by mouth every Tuesday, Thursday, and Saturday at 6 PM. (Patient not taking: No sig reported) 30 capsule 3 Not Taking   calcium acetate (PHOSLO) 667 MG capsule Take 1 capsule (667 mg  total) by mouth 3 (three) times daily with meals. 90 capsule 2    cloNIDine (CATAPRES) 0.1 MG tablet Take 1 tablet (0.1 mg total) by mouth daily. (Patient not taking: No sig reported) 30 tablet 0 Not Taking   pantoprazole (PROTONIX) 40 MG tablet Take 1 tablet (40 mg total) by mouth daily. 30 tablet 1    traMADol (ULTRAM) 50 MG tablet Take 1 tablet (50 mg total) by mouth every 8 (eight) hours as needed for moderate pain. (Patient not taking: Reported on 09/17/2020) 8 tablet 0 Not Taking   Scheduled:   aspirin EC  81 mg Oral Daily   atorvastatin  80 mg Oral QHS   isosorbide mononitrate  15 mg Oral Daily   lidocaine  1 patch Transdermal Q24H   metoprolol succinate  25 mg Oral Daily   sodium chloride flush  3 mL Intravenous Q12H   sodium chloride flush  3 mL Intravenous Q12H   Infusions:   sodium chloride  sodium chloride     sodium chloride     sodium chloride     ampicillin-sulbactam (UNASYN) IV Stopped (09/17/20 1624)   heparin 1,150 Units/hr (09/19/20 0335)    Assessment: 80yo male admitted for acute on chronic CHF w/new onset Afib. Cath showed 3vCAD, consulting CVTS for possible CABG. Pt also w/history of cirrhosis and esophageal varices, will need to evaluate need for long-term anticoagulation and consider bleed vs clot risk.   Heparin level 0.16 and subtherapeutic. Hgb/Hct stable, plt stable and WNL. No s/sx of bleeding or issues with site per RN.   Goal of Therapy:  Heparin level 0.3-0.7 units/ml Monitor platelets by anticoagulation protocol: Yes   Plan:  Increase IV heparin gtt to 1400 units/hr Check 8h heparin level Daily heparin level, CBC Monitor for s/sx of bleeding  Follow-up long-term anticoagulation plan   Thank you for involving pharmacy in this patient's care.  Elita Quick, PharmD PGY1 Ambulatory Care Pharmacy Resident 09/19/2020 1:13 PM  **Pharmacist phone directory can be found on [email or web address] listed under [place]**

## 2020-09-19 NOTE — Progress Notes (Signed)
[facility] Progress Note   Subjective:   Had [place] yesterday --severe CAD. CTS to evaluate for CABG No complaints this am. Denies cp, sob. For dialysis today   Objective Vitals:   09/19/20 0418 09/19/20 0500 09/19/20 0954 09/19/20 1129  BP: 96/82  112/68 115/62  Pulse: 81  79 79  Resp: 18  18 18   Temp: 98.2 F (36.8 C)  98.1 F (36.7 C) 97.7 F (36.5 C)  TempSrc: Oral  Oral Oral  SpO2: 98%  98% 98%  Weight:  74.6 kg    Height:       Physical Exam General: Well appearing man, NAD. Room air. Heart: RRR; no murmur Lungs: CTAB Abdomen: soft, non-tender Extremities: No LE edema Dialysis Access: L AVF + bruit  Additional Objective Labs: Basic Metabolic Panel: Recent Labs  Lab 09/15/20 2025 09/16/20 0335 09/17/20 0319 09/18/20 0107 09/18/20 1706 09/18/20 1710 09/19/20 0358  NA 137   < > 137 138 138  139 139 134*  K 3.4*   < > 3.4* 3.3* 3.9  3.9 3.8 3.9  CL 99   < > 99 99  --   --  99  CO2 24   < > 28 28  --   --  23  GLUCOSE 90   < > 99 141*  --   --  91  BUN 31*   < > 18 12  --   --  23  CREATININE 7.61*   < > 4.86* 3.54*  --   --  5.34*  CALCIUM 8.0*   < > 8.0* 8.2*  --   --  8.0*  PHOS 4.2  --   --   --   --   --   --    < > = values in this interval not displayed.    Liver Function Tests: Recent Labs  Lab 09/15/20 1526 09/15/20 2025  AST 25  --   ALT 14  --   ALKPHOS 108  --   BILITOT 1.0  --   PROT 6.4*  --   ALBUMIN 3.4* 2.8*    Recent Labs  Lab 09/15/20 1526  LIPASE 27    CBC: Recent Labs  Lab 09/15/20 1526 09/15/20 2025 09/16/20 0335 09/17/20 0319 09/18/20 0107 09/18/20 1706 09/18/20 1710 09/19/20 0358  WBC 6.7 8.2 7.3 6.9 5.0  --   --  6.1  NEUTROABS 5.8  --   --   --   --   --   --   --   HGB 10.2* 9.8* 9.8* 10.3* 10.2* 9.9*  10.2* 9.9* 10.4*  HCT 33.2* 30.4* 30.9* 32.1* 31.8* 29.0*  30.0* 29.0* 32.1*  MCV 108.1* 105.6* 107.3* 104.6* 104.3*  --   --  104.6*  PLT 90* 91* 87* 88* 87*  --   --  120*    Studies/Results: CARDIAC CATHETERIZATION  Result Date: 09/18/2020 Formatting of this result is different from the original.   Prox LAD to Mid LAD lesion is 85% stenosed.   Mid LAD lesion is 50% stenosed.   Dist LAD lesion is 20% stenosed.   Prox Cx to Mid Cx lesion is 65% stenosed.   Prox RCA lesion is 95% stenosed.   Mid RCA lesion is 90% stenosed.   LV end diastolic pressure is mildly elevated.   Hemodynamic findings consistent with mild pulmonary hypertension. There is severe coronary calcification involving the LAD and right coronary artery with mild calcification of the left circumflex vessel. Severe multivessel  CAD with 67 to 90% calcified proximal LAD stenosis followed by 50% stenosis after the first diagonal vessel with 20% mid distal LAD stenosis; normal ramus intermediate vessel; 65% mildly calcified proximal circumflex stenosis; and severe calcification of the right coronary artery with 95 to 99% proximal stenosis followed by 90% mid stenosis and significantly calcified segments. Mild right heart pressure elevation with mild pulmonary hypertension. RECOMMENDATION: The patient has severe coronary calcification and three-vessel disease.  Echo Doppler has demonstrated reduced EF in the 25 to 30% range.  Recommend initial surgical consultation for consideration of possible CABG revascularization surgery.  However with the patient's significant comorbidities, if surgery is excluded, will need to review with colleagues regarding potential orbital atherectomy and intervention into the LAD and RCA.  We will consider reinstitution of low-dose heparin much later today and this patient with recent PAF and ulcerated plaque in the proximal RCA.   Medications:  sodium chloride     sodium chloride     sodium chloride     sodium chloride     ampicillin-sulbactam (UNASYN) IV Stopped (09/17/20 1624)   heparin 1,150 Units/hr (09/19/20 0335)    aspirin EC  81 mg Oral Daily   atorvastatin  80 mg Oral QHS    isosorbide mononitrate  15 mg Oral Daily   lidocaine  1 patch Transdermal Q24H   metoprolol succinate  25 mg Oral Daily   sodium chloride flush  3 mL Intravenous Q12H   sodium chloride flush  3 mL Intravenous Q12H    Dialysis Orders: TTS [place] 4hr, 350/500, EDW 77.5kg, 3K/2.5Ca, AVF, heparin 3000 bolus - Venofer 50mg  IV q week - Calcitriol 0.35mcg PO q HD   Assessment/Plan:  Chest pain/B pleural effusions/pneumonia: CTA neg for PE/dissection. S/p extra HD on 7/20. On Unasyn for pneumonia coverage.   ESRD: Usual TTS schedule , s/p extra HD 7/20. Next HD on Sat 7/23.  Hypertension/volume: BP low/normal, on metoprolol for now.  Anemia: Hgb 10.4 - follow without ESA for now.  Metabolic bone disease: Ca/Phos ok - continue home binders/VDRA.  Cirrhosis/thrombocytopenia  Elevated Trop: Follow trends  AAA/new ascending thoracic AA: For OP follow-up per notes.  Thrombocytopenia - improving.   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 Per cards to have CT surgery evaluate for CABG   New A-Fib: Per cards. Remains on heparin gtt   Lynnda Child PA-C [facility] 09/19/2020,11:56 AM

## 2020-09-19 NOTE — Progress Notes (Signed)
PROGRESS NOTE    Ricky Harris  HQP:[id number] DOB: [birth year] DOA: 09/15/2020 PCP: [name]., MD   Brief Narrative:  Ricky Harris is a 80 y.o. male with medical history significant for ESRD on hemodialysis, liver cirrhosis, hypertension, hypothyroidism, AAA with stent graft, and coronary artery disease seen on CT scan, presented from his dialysis center for evaluation of right-sided chest pain.  Patient reports a nonproductive cough for 1 to 2 weeks, has developed some exertional dyspnea over this interval, and then right-sided chest pain in the past 2 to 3 days.  Pain becoming severe during dialysis which was stopped after 30 minutes so that the patient could be evaluated in the ED.    Upon arrival to the ED, patient is found to be afebrile, saturating mid 90s on room air, mildly tachypneic, and with stable blood pressure.  EKG features sinus rhythm with LAD.  Chest x-ray with left greater than right pleural effusions.  CTA chest/abdomen/pelvis is negative for PE or aortic dissection but notable for ascending thoracic aortic aneurysm and new moderate pleural effusions bilaterally. BNP >4500. Nephrology was consulted by the ED physician, patient was treated with fentanyl, and admitted under hospitalist service.  Assessment & Plan:   Principal Problem:   Bilateral pleural effusion Active Problems:   Essential hypertension   AAA (abdominal aortic aneurysm) without rupture (HCC)   ESRD (end stage renal disease) (HCC)   Cirrhosis of liver (HCC)   Fluid overload, unspecified   Thrombocytopenia (HCC)   Thoracic aortic aneurysm (HCC)   Elevated troponin   Chest pain   CAP (community acquired pneumonia)   A-fib ([place])   1. Bilateral pleural effusions; acute on chronic diastolic CHF combined with acute systolic CHF: - BNP >5993 in ED, reports recent wt gain, had EF 60-65% on yr ago but now 25 to 30%.  Underwent left heart cath 09/18/2020 and found to have three-vessel disease.   Cardiology has consulted CT surgery for possible consideration of CABG.  Further management per CT surgery and cardiology combined.   2. ESRD: Left HD after 30 minutes on the day of admission to come to ED for evaluation of right-sided chest pain.  Nephrology on board now.  Management per them.   3.  Liver cirrhosis  - Appears compensated    4.  Right-sided chest pain: Denies any chest pain today.  CTA negative for PE.  New diagnosis of acute CHF.   5. Elevated troponin: Slightly elevated but flat, not indicative of ACS.   6. Hx AAA s/p endovascular repair, - CTA in ED negative for dissection but notable for 4.1 cm ascending thoracic aortic aneurysm and patent aortic stent graft with 6.1 cm native sac diameter up from 5.9 cm. outpatient follow-up recommended.  Hold off to vascular consultation until his cardiac issues are fixed.   7. Thrombocytopenia - Platelets 90k in ED, appears chronic, no apparent bleeding or infection, likely related to chronic liver disease numbers are stable.   8.  Community-acquired pneumonia: Chest x-ray also suspicious for bilateral, left greater than right airspace disease.  Continue Unasyn.  9.  New onset atrial fibrillation: After dialysis on the evening of 09/17/2020, patient was noted to have developed new onset of atrial fibrillation.  Cardiology was consulted. spontaneously converted back to normal sinus rhythm.  Still in normal sinus rhythm.  Cardiology on board and managing.  Patient on Toprol-XL, Isorbid and aspirin.  He remains on heparin drip due to severe CAD.  10.  Dyslipidemia: Now  on atorvastatin 80 mg p.o. daily.  11.  Hypokalemia: Resolved.  DVT prophylaxis: SCD's Start: 09/18/20 1801   Code Status: Full Code  Family Communication: None present at bedside.  Plan of care discussed with patient in length and he verbalized understanding and agreed with it.  Status is: Inpatient  Remains inpatient appropriate because:Inpatient level of care  appropriate due to severity of illness  Dispo: The patient is from: Home              Anticipated d/c is to: Home              Patient currently is not medically stable to d/c.   Difficult to place patient No   Estimated body mass index is 22.3 kg/m as calculated from the following:   Height as of this encounter: 6' (1.829 m).   Weight as of this encounter: 74.6 kg.   Nutritional status:  Consultants:  Nephrology Cardiology CTS  Procedures:  Left and right heart cath.  Antimicrobials:  Anti-infectives (From admission, onward)    Start     Dose/Rate Route Frequency Ordered Stop   09/17/20 0000  amoxicillin-clavulanate (AUGMENTIN) 500-125 MG tablet        1 tablet Oral 2 times daily 09/17/20 0945 09/23/20 2359   09/16/20 1600  ampicillin-sulbactam (UNASYN) 1.5 g in sodium chloride 0.9 % 100 mL IVPB        1.5 g 200 mL/hr over 30 Minutes Intravenous Every 24 hours 09/16/20 0931     09/16/20 0800  cefTRIAXone (ROCEPHIN) 1 g in sodium chloride 0.9 % 100 mL IVPB  Status:  Discontinued        1 g 200 mL/hr over 30 Minutes Intravenous Every 24 hours 09/16/20 0753 09/16/20 0903   09/16/20 0800  azithromycin (ZITHROMAX) 250 mg in dextrose 5 % 125 mL IVPB  Status:  Discontinued        250 mg 125 mL/hr over 60 Minutes Intravenous Every 24 hours 09/16/20 0753 09/16/20 0903          Subjective: Seen and examined.  No complaints.  Denies shortness of breath or chest pain.  Objective: Vitals:   09/19/20 0418 09/19/20 0500 09/19/20 0954 09/19/20 1129  BP: 96/82  112/68 115/62  Pulse: 81  79 79  Resp: 18  18 18   Temp: 98.2 F (36.8 C)  98.1 F (36.7 C) 97.7 F (36.5 C)  TempSrc: Oral  Oral Oral  SpO2: 98%  98% 98%  Weight:  74.6 kg    Height:        Intake/Output Summary (Last 24 hours) at 09/19/2020 1130 Last data filed at 09/18/2020 1531 Gross per 24 hour  Intake 205.68 ml  Output --  Net 205.68 ml    Filed Weights   09/18/20 0510 09/18/20 0959 09/19/20 0500   Weight: 76.9 kg 76.9 kg 74.6 kg    Examination:  General exam: Appears calm and comfortable  Respiratory system: Clear to auscultation. Respiratory effort normal. Cardiovascular system: S1 & S2 heard, RRR. No JVD, murmurs, rubs, gallops or clicks. No pedal edema. Gastrointestinal system: Abdomen is nondistended, soft and nontender. No organomegaly or masses felt. Normal bowel sounds heard. Central nervous system: Alert and oriented. No focal neurological deficits. Extremities: Symmetric 5 x 5 power. Skin: No rashes, lesions or ulcers.  Psychiatry: Judgement and insight appear normal. Mood & affect appropriate.    Data Reviewed: I have personally reviewed following labs and imaging studies  CBC: Recent Labs  Lab 09/15/20 1526  09/15/20 2025 09/16/20 0335 09/17/20 0319 09/18/20 0107 09/18/20 1706 09/18/20 1710 09/19/20 0358  WBC 6.7 8.2 7.3 6.9 5.0  --   --  6.1  NEUTROABS 5.8  --   --   --   --   --   --   --   HGB 10.2* 9.8* 9.8* 10.3* 10.2* 9.9*  10.2* 9.9* 10.4*  HCT 33.2* 30.4* 30.9* 32.1* 31.8* 29.0*  30.0* 29.0* 32.1*  MCV 108.1* 105.6* 107.3* 104.6* 104.3*  --   --  104.6*  PLT 90* 91* 87* 88* 87*  --   --  120*    Basic Metabolic Panel: Recent Labs  Lab 09/15/20 2025 09/16/20 0335 09/17/20 0319 09/18/20 0107 09/18/20 1706 09/18/20 1710 09/19/20 0358  NA 137 135 137 138 138  139 139 134*  K 3.4* 3.7 3.4* 3.3* 3.9  3.9 3.8 3.9  CL 99 99 99 99  --   --  99  CO2 24 25 28 28   --   --  23  GLUCOSE 90 81 99 141*  --   --  91  BUN 31* 34* 18 12  --   --  23  CREATININE 7.61* 7.93* 4.86* 3.54*  --   --  5.34*  CALCIUM 8.0* 8.0* 8.0* 8.2*  --   --  8.0*  MG  --   --   --  1.8  --   --   --   PHOS 4.2  --   --   --   --   --   --     GFR: Estimated Creatinine Clearance: 12 mL/min (A) (by C-G formula based on SCr of 5.34 mg/dL (H)). Liver Function Tests: Recent Labs  Lab 09/15/20 1526 09/15/20 2025  AST 25  --   ALT 14  --   ALKPHOS 108  --    BILITOT 1.0  --   PROT 6.4*  --   ALBUMIN 3.4* 2.8*    Recent Labs  Lab 09/15/20 1526  LIPASE 27    No results for input(s): AMMONIA in the last 168 hours. Coagulation Profile: Recent Labs  Lab 09/15/20 1526  INR 1.2    Cardiac Enzymes: No results for input(s): CKTOTAL, CKMB, CKMBINDEX, TROPONINI in the last 168 hours. BNP (last 3 results) No results for input(s): PROBNP in the last 8760 hours. HbA1C: No results for input(s): HGBA1C in the last 72 hours. CBG: No results for input(s): GLUCAP in the last 168 hours. Lipid Profile: Recent Labs    09/19/20 0358  CHOL 133  HDL 38*  LDLCALC 74  TRIG 103  CHOLHDL 3.5   Thyroid Function Tests: Recent Labs    09/18/20 0107  TSH 4.650*    Anemia Panel: No results for input(s): VITAMINB12, FOLATE, FERRITIN, TIBC, IRON, RETICCTPCT in the last 72 hours. Sepsis Labs: Recent Labs  Lab 09/16/20 0335  PROCALCITON 0.60     Recent Results (from the past 240 hour(s))  Resp Panel by RT-PCR (Flu A&B, Covid) Nasopharyngeal Swab     Status: None   Collection Time: 09/15/20  4:14 PM   Specimen: Nasopharyngeal Swab; Nasopharyngeal(NP) swabs in vial transport medium  Result Value Ref Range Status   SARS Coronavirus 2 by RT PCR NEGATIVE NEGATIVE Final    Comment: (NOTE) SARS-CoV-2 target nucleic acids are NOT DETECTED.  The SARS-CoV-2 RNA is generally detectable in upper respiratory specimens during the acute phase of infection. The lowest concentration of SARS-CoV-2 viral copies this assay can detect is  138 copies/mL. A negative result does not preclude SARS-Cov-2 infection and should not be used as the sole basis for treatment or other patient management decisions. A negative result may occur with  improper specimen collection/handling, submission of specimen other than nasopharyngeal swab, presence of viral mutation(s) within the areas targeted by this assay, and inadequate number of viral copies(<138 copies/mL). A  negative result must be combined with clinical observations, patient history, and epidemiological information. The expected result is Negative.  Fact Sheet for Patients:  EntrepreneurPulse.com.au  Fact Sheet for Healthcare Providers:  IncredibleEmployment.be  This test is no t yet approved or cleared by the [place] FDA and  has been authorized for detection and/or diagnosis of SARS-CoV-2 by FDA under an Emergency Use Authorization (EUA). This EUA will remain  in effect (meaning this test can be used) for the duration of the COVID-19 declaration under Section 564(b)(1) of the Act, 21 U.S.C.section 360bbb-3(b)(1), unless the authorization is terminated  or revoked sooner.       Influenza A by PCR NEGATIVE NEGATIVE Final   Influenza B by PCR NEGATIVE NEGATIVE Final    Comment: (NOTE) The Xpert Xpress SARS-CoV-2/FLU/RSV plus assay is intended as an aid in the diagnosis of influenza from Nasopharyngeal swab specimens and should not be used as a sole basis for treatment. Nasal washings and aspirates are unacceptable for Xpert Xpress SARS-CoV-2/FLU/RSV testing.  Fact Sheet for Patients: EntrepreneurPulse.com.au  Fact Sheet for Healthcare Providers: IncredibleEmployment.be  This test is not yet approved or cleared by the [place] FDA and has been authorized for detection and/or diagnosis of SARS-CoV-2 by FDA under an Emergency Use Authorization (EUA). This EUA will remain in effect (meaning this test can be used) for the duration of the COVID-19 declaration under Section 564(b)(1) of the Act, 21 U.S.C. section 360bbb-3(b)(1), unless the authorization is terminated or revoked.  Performed at [facility], [place] [address], [place], [place] [address]        Radiology Studies: CARDIAC CATHETERIZATION  Result Date: 09/18/2020 Formatting of this result is different from the original.   Prox LAD  to Mid LAD lesion is 85% stenosed.   Mid LAD lesion is 50% stenosed.   Dist LAD lesion is 20% stenosed.   Prox Cx to Mid Cx lesion is 65% stenosed.   Prox RCA lesion is 95% stenosed.   Mid RCA lesion is 90% stenosed.   LV end diastolic pressure is mildly elevated.   Hemodynamic findings consistent with mild pulmonary hypertension. There is severe coronary calcification involving the LAD and right coronary artery with mild calcification of the left circumflex vessel. 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 Mild right heart pressure elevation with mild pulmonary hypertension. RECOMMENDATION: The patient has severe coronary calcification and three-vessel disease.  Echo Doppler has demonstrated reduced EF in the 25 to 30% range.  Recommend initial surgical consultation for consideration of possible CABG revascularization surgery.  However with the patient's significant comorbidities, if surgery is excluded, will need to review with colleagues regarding potential orbital atherectomy and intervention into the LAD and RCA.  We will consider reinstitution of low-dose heparin much later today and this patient with recent PAF and ulcerated plaque  in the proximal RCA.    Scheduled Meds:  aspirin EC  81 mg Oral Daily   atorvastatin  80 mg Oral QHS   isosorbide mononitrate  15 mg Oral Daily   lidocaine  1 patch Transdermal Q24H   metoprolol succinate  25 mg Oral Daily   sodium chloride flush  3 mL Intravenous Q12H   sodium chloride flush  3 mL Intravenous Q12H   Continuous Infusions:  sodium chloride     sodium chloride     sodium chloride     sodium chloride      ampicillin-sulbactam (UNASYN) IV Stopped (09/17/20 1624)   heparin 1,150 Units/hr (09/19/20 0335)     LOS: 4 days   Time spent: 30 minutes   Darliss Cheney, MD [facility]  09/19/2020, 11:30 AM   How to contact the [facility] Attending or Consulting provider [place] or covering provider during after hours [place], for this patient?  Check the care team in [facility] and look for a) attending/consulting [facility] provider listed and b) the [facility] team listed. Page or secure chat [phone]. Log into [email or web address] and use [place]'s universal password to access. If you do not have the password, please contact the hospital operator. Locate the [facility] provider you are looking for under [facility] and page to a number that you can be directly reached. If you still have difficulty reaching the provider, please page the [facility] (Director on Call) for the Hospitalists listed on amion for assistance.

## 2020-09-19 NOTE — Progress Notes (Signed)
ANTICOAGULATION CONSULT NOTE  Pharmacy Consult for heparin Indication:  Afib and r/o ACS  Patient Measurements: Height: 6' (182.9 cm) Weight: 72.3 kg (159 lb 6.3 oz) IBW/kg (Calculated) : 77.6 Heparin Dosing Wt: 72 kg  Vital Signs: Temp: 98.5 F (36.9 C) (07/23 2100) Temp Source: Oral (07/23 2100) BP: 112/73 (07/23 2100) Pulse Rate: 86 (07/23 2100)  Labs: Recent Labs    09/17/20 0319 09/18/20 0107 09/18/20 0216 09/18/20 0458 09/18/20 1055 09/18/20 1706 09/18/20 1710 09/19/20 0358 09/19/20 1140 09/19/20 2139  HGB 10.3* 10.2*  --   --   --  9.9*  10.2* 9.9* 10.4*  --   --   HCT 32.1* 31.8*  --   --   --  29.0*  30.0* 29.0* 32.1*  --   --   PLT 88* 87*  --   --   --   --   --  120*  --   --   HEPARINUNFRC  --   --   --   --  0.30  --   --   --  0.16* 0.28*  CREATININE 4.86* 3.54*  --   --   --   --   --  5.34*  --   --   TROPONINIHS  --   --  103* 110*  --   --   --   --   --   --      Estimated Creatinine Clearance: 11.7 mL/min (A) (by C-G formula based on SCr of 5.34 mg/dL (H)).   Medications:  Medications Prior to Admission  Medication Sig Dispense Refill Last Dose   amLODipine (NORVASC) 5 MG tablet Take 5 mg by mouth daily.   09/15/2020   atorvastatin (LIPITOR) 10 MG tablet Take 10 mg by mouth daily.   09/15/2020   carvedilol (COREG) 12.5 MG tablet Take 12.5 mg by mouth 2 (two) times daily.   09/15/2020 at 0900   febuxostat (ULORIC) 40 MG tablet Take 1 tablet (40 mg total) by mouth daily. 30 tablet 3 Past Month   lactulose (CHRONULAC) 10 GM/15ML solution Take 15 mLs (10 g total) by mouth daily. 236 mL 1 Past Month   multivitamin (RENA-VIT) TABS tablet Take 1 tablet by mouth daily. 30 tablet 0 09/15/2020   calcitRIOL (ROCALTROL) 0.25 MCG capsule Take 1 capsule (0.25 mcg total) by mouth every Tuesday, Thursday, and Saturday at 6 PM. (Patient not taking: No sig reported) 30 capsule 3 Not Taking   calcium acetate (PHOSLO) 667 MG capsule Take 1 capsule (667 mg total) by  mouth 3 (three) times daily with meals. 90 capsule 2    cloNIDine (CATAPRES) 0.1 MG tablet Take 1 tablet (0.1 mg total) by mouth daily. (Patient not taking: No sig reported) 30 tablet 0 Not Taking   pantoprazole (PROTONIX) 40 MG tablet Take 1 tablet (40 mg total) by mouth daily. 30 tablet 1    traMADol (ULTRAM) 50 MG tablet Take 1 tablet (50 mg total) by mouth every 8 (eight) hours as needed for moderate pain. (Patient not taking: Reported on 09/17/2020) 8 tablet 0 Not Taking   Scheduled:   aspirin EC  81 mg Oral Daily   atorvastatin  80 mg Oral QHS   isosorbide mononitrate  15 mg Oral Daily   lidocaine  1 patch Transdermal Q24H   metoprolol succinate  25 mg Oral Daily   sodium chloride flush  3 mL Intravenous Q12H   sodium chloride flush  3 mL Intravenous Q12H   Infusions:  sodium chloride     sodium chloride     sodium chloride     sodium chloride     ampicillin-sulbactam (UNASYN) IV 1.5 g (09/19/20 1652)   heparin 1,400 Units/hr (09/19/20 2210)    Assessment: 80yo male admitted for acute on chronic CHF w/new onset Afib. Cath showed 3vCAD, consulting CVTS for possible CABG. Pt also w/history of cirrhosis and esophageal varices, will need to evaluate need for long-term anticoagulation and consider bleed vs clot risk.   Heparin level this evening is SUBtherapeutic though trending up after a rate increase earlier today (HL 0.28 << 0.16, goal of 0.3-0.7). No bleeding or issues noted.   Goal of Therapy:  Heparin level 0.3-0.7 units/ml Monitor platelets by anticoagulation protocol: Yes   Plan:  - Increase Heparin to 1500 units/hr (15 ml/hr) - Will continue to monitor for any signs/symptoms of bleeding and will follow up with heparin level in 8 hours   Thank you for allowing pharmacy to be a part of this patient's care.  Alycia Rossetti, PharmD, BCPS Clinical Pharmacist Clinical phone for 09/19/2020: [phone] 09/19/2020 10:25 PM   **Pharmacist phone directory can now be found on  [email or web address] ([email or web address]).  Listed under [place].

## 2020-09-19 NOTE — Progress Notes (Signed)
Progress Note  Patient Name: Ricky Harris Date of Encounter: 09/19/2020  [facility] Cardiologist: New  Subjective   No complaints  Inpatient Medications    Scheduled Meds:  aspirin EC  81 mg Oral Daily   atorvastatin  80 mg Oral QHS   isosorbide mononitrate  15 mg Oral Daily   lidocaine  1 patch Transdermal Q24H   metoprolol succinate  25 mg Oral Daily   sodium chloride flush  3 mL Intravenous Q12H   sodium chloride flush  3 mL Intravenous Q12H   Continuous Infusions:  sodium chloride     sodium chloride     sodium chloride     sodium chloride     ampicillin-sulbactam (UNASYN) IV Stopped (09/17/20 1624)   heparin 1,150 Units/hr (09/19/20 0335)   PRN Meds: sodium chloride, sodium chloride, sodium chloride, sodium chloride, acetaminophen, alteplase, diazepam, fentaNYL (SUBLIMAZE) injection, heparin, lidocaine (PF), lidocaine-prilocaine, ondansetron (ZOFRAN) IV, pentafluoroprop-tetrafluoroeth, sodium chloride flush   Vital Signs    Vitals:   09/18/20 1853 09/18/20 2049 09/19/20 0418 09/19/20 0500  BP: 119/69 108/62 96/82   Pulse: 83 76 81   Resp:  18 18   Temp:  98.1 F (36.7 C) 98.2 F (36.8 C)   TempSrc:  Oral Oral   SpO2: 98% 99% 98%   Weight:    74.6 kg  Height:        Intake/Output Summary (Last 24 hours) at 09/19/2020 0929 Last data filed at 09/18/2020 1531 Gross per 24 hour  Intake 205.68 ml  Output --  Net 205.68 ml   Last 3 Weights 09/19/2020 09/18/2020 09/18/2020  Weight (lbs) 164 lb 6.4 oz 169 lb 8.5 oz 169 lb 8.5 oz  Weight (kg) 74.571 kg 76.9 kg 76.9 kg      Telemetry    SR - Personally Reviewed  ECG    N/a - Personally Reviewed  Physical Exam   GEN: No acute distress.   Neck: No JVD Cardiac: RRR, no murmurs, rubs, or gallops.  Respiratory: Clear to auscultation bilaterally. GI: Soft, nontender, non-distended  MS: No edema; No deformity. Neuro:  Nonfocal  Psych: Normal affect   Labs    High Sensitivity Troponin:   Recent  Labs  Lab 09/15/20 1526 09/15/20 1806 09/18/20 0216 09/18/20 0458  TROPONINIHS 96* 87* 103* 110*      Chemistry Recent Labs  Lab 09/15/20 1526 09/15/20 2025 09/16/20 0335 09/17/20 0319 09/18/20 0107 09/18/20 1706 09/18/20 1710 09/19/20 0358  NA 140 137   < > 137 138 138  139 139 134*  K 3.2* 3.4*   < > 3.4* 3.3* 3.9  3.9 3.8 3.9  CL 101 99   < > 99 99  --   --  99  CO2 27 24   < > 28 28  --   --  23  GLUCOSE 82 90   < > 99 141*  --   --  91  BUN 29* 31*   < > 18 12  --   --  23  CREATININE 7.10* 7.61*   < > 4.86* 3.54*  --   --  5.34*  CALCIUM 8.3* 8.0*   < > 8.0* 8.2*  --   --  8.0*  PROT 6.4*  --   --   --   --   --   --   --   ALBUMIN 3.4* 2.8*  --   --   --   --   --   --  AST 25  --   --   --   --   --   --   --   ALT 14  --   --   --   --   --   --   --   ALKPHOS 108  --   --   --   --   --   --   --   BILITOT 1.0  --   --   --   --   --   --   --   GFRNONAA 7* 7*   < > 12* 17*  --   --  10*  ANIONGAP 12 14   < > 10 11  --   --  12   < > = values in this interval not displayed.     Hematology Recent Labs  Lab 09/17/20 0319 09/18/20 0107 09/18/20 1706 09/18/20 1710 09/19/20 0358  WBC 6.9 5.0  --   --  6.1  RBC 3.07* 3.05*  --   --  3.07*  HGB 10.3* 10.2* 9.9*  10.2* 9.9* 10.4*  HCT 32.1* 31.8* 29.0*  30.0* 29.0* 32.1*  MCV 104.6* 104.3*  --   --  104.6*  MCH 33.6 33.4  --   --  33.9  MCHC 32.1 32.1  --   --  32.4  RDW 16.7* 16.2*  --   --  16.2*  PLT 88* 87*  --   --  120*    BNP Recent Labs  Lab 09/15/20 1618  BNP >4,500.0*     DDimer No results for input(s): DDIMER in the last 168 hours.   Radiology    CARDIAC CATHETERIZATION  Result Date: 09/18/2020 Formatting of this result is different from the original.   Prox LAD to Mid LAD lesion is 85% stenosed.   Mid LAD lesion is 50% stenosed.   Dist LAD lesion is 20% stenosed.   Prox Cx to Mid Cx lesion is 65% stenosed.   Prox RCA lesion is 95% stenosed.   Mid RCA lesion is 90% stenosed.    LV end diastolic pressure is mildly elevated.   Hemodynamic findings consistent with mild pulmonary hypertension. There is severe coronary calcification involving the LAD and right coronary artery with mild calcification of the left circumflex vessel. 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 Mild right heart pressure elevation with mild pulmonary hypertension. RECOMMENDATION: The patient has severe coronary calcification and three-vessel disease.  Echo Doppler has demonstrated reduced EF in the 25 to 30% range.  Recommend initial surgical consultation for consideration of possible CABG revascularization surgery.  However with the patient's significant comorbidities, if surgery is excluded, will need to review with colleagues regarding potential orbital atherectomy and intervention into the LAD and RCA.  We will consider reinstitution of low-dose heparin much later today and this patient with recent PAF and ulcerated plaque in the proximal RCA.    Cardiac Studies    Patient Profile     80 y.o. 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    Assessment & Plan    Afib with RVR - new diagnosis this admission - self converted to SR - have not started oral anticoag in setting of cirrhosis and esophageal varices, he is on hep gtt in setting of  significant CAD - on toprol for rate control   2. Acute systolic HF/Ischemic CM/CAD - 08/2019 echo LVEF 60-65% - 08/2020 echo LVEF 25-30%, global hypokinesis, grade I dd, mild RV dysfunction - RHC/LHC: prox LAD 85%, mid LAD 50%, LCX 65%, prox RCA 95% and mid 90% CI 3.55 mean PA 27 PCWP not reported, LVEDP 19  -for CT surgery evaluation for possible CABG - if turned down would need to discuss PCI options with interventional  -medical therapy with hep gtt, ASA 81, atorva 80, toprol 25. Soft bp's at times have not started ACE/ARB/ARNI/aldactone - fluid management per HD   3. ESRD - per renal   4.History of cirrhosis. 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   5.Thrombocytopenia - uptrending to 120 today, continue to monitor  For questions or updates, please contact [place] Please consult [email or web address] for contact info under        Signed, Carlyle Dolly, MD  09/19/2020, 9:29 AM

## 2020-09-20 DIAGNOSIS — I714 Abdominal aortic aneurysm, without rupture: Secondary | ICD-10-CM

## 2020-09-20 DIAGNOSIS — Z992 Dependence on renal dialysis: Secondary | ICD-10-CM

## 2020-09-20 DIAGNOSIS — I4891 Unspecified atrial fibrillation: Secondary | ICD-10-CM

## 2020-09-20 DIAGNOSIS — J90 Pleural effusion, not elsewhere classified: Secondary | ICD-10-CM

## 2020-09-20 DIAGNOSIS — I2510 Atherosclerotic heart disease of native coronary artery without angina pectoris: Secondary | ICD-10-CM

## 2020-09-20 DIAGNOSIS — I350 Nonrheumatic aortic (valve) stenosis: Secondary | ICD-10-CM

## 2020-09-20 DIAGNOSIS — N186 End stage renal disease: Secondary | ICD-10-CM

## 2020-09-20 LAB — HEPARIN LEVEL (UNFRACTIONATED)
Heparin Unfractionated: 0.39 IU/mL (ref 0.30–0.70)
Heparin Unfractionated: 0.38 IU/mL (ref 0.30–0.70)

## 2020-09-20 LAB — BASIC METABOLIC PANEL
Anion gap: 11 (ref 5–15)
BUN: 13 mg/dL (ref 8–23)
CO2: 26 mmol/L (ref 22–32)
Calcium: 8.5 mg/dL — ABNORMAL LOW (ref 8.9–10.3)
Chloride: 98 mmol/L (ref 98–111)
Creatinine, Ser: 4.06 mg/dL — ABNORMAL HIGH (ref 0.61–1.24)
GFR, Estimated: 14 mL/min — ABNORMAL LOW (ref >60)
Glucose, Bld: 105 mg/dL — ABNORMAL HIGH (ref 70–99)
Potassium: 3.5 mmol/L (ref 3.5–5.1)
Sodium: 135 mmol/L (ref 135–145)

## 2020-09-20 LAB — CBC
HCT: 29.6 % — ABNORMAL LOW (ref 39.0–52.0)
Hemoglobin: 9.5 g/dL — ABNORMAL LOW (ref 13.0–17.0)
MCH: 33 pg (ref 26.0–34.0)
MCHC: 32.1 g/dL (ref 30.0–36.0)
MCV: 102.8 fL — ABNORMAL HIGH (ref 80.0–100.0)
Platelets: 111 10*3/uL — ABNORMAL LOW (ref 150–400)
RBC: 2.88 MIL/uL — ABNORMAL LOW (ref 4.22–5.81)
RDW: 15.7 % — ABNORMAL HIGH (ref 11.5–15.5)
WBC: 5.3 10*3/uL (ref 4.0–10.5)
nRBC: 0 % (ref 0.0–0.2)

## 2020-09-20 MED ORDER — SPIRONOLACTONE 12.5 MG HALF TABLET
12.5000 mg | ORAL_TABLET | Freq: Every day | ORAL | Status: DC
  Administered 2020-09-20 – 2020-09-23 (×4): 12.5 mg via ORAL
  Filled 2020-09-20 (×4): qty 1

## 2020-09-20 NOTE — Progress Notes (Signed)
PROGRESS NOTE    Ricky Harris  MGQ:[id number] DOB: 1941-02-06 DOA: 09/15/2020 PCP: [name]., MD   Brief Narrative:  Ricky Harris is a 80 y.o. male with medical history significant for ESRD on hemodialysis, liver cirrhosis, hypertension, hypothyroidism, AAA with stent graft, and coronary artery disease seen on CT scan, presented from his dialysis center for evaluation of right-sided chest pain.  Patient reports a nonproductive cough for 1 to 2 weeks, has developed some exertional dyspnea over this interval, and then right-sided chest pain in the past 2 to 3 days.  Pain becoming severe during dialysis which was stopped after 30 minutes so that the patient could be evaluated in the ED.    Upon arrival to the ED, patient is found to be afebrile, saturating mid 90s on room air, mildly tachypneic, and with stable blood pressure.  EKG features sinus rhythm with LAD.  Chest x-ray with left greater than right pleural effusions.  CTA chest/abdomen/pelvis is negative for PE or aortic dissection but notable for ascending thoracic aortic aneurysm and new moderate pleural effusions bilaterally. BNP >4500. Nephrology was consulted by the ED physician, patient was treated with fentanyl, and admitted under hospitalist service.  09/20/2020: Patient was seen and examined at his bedside.  He has no new complaints.  Denies any chest pain, dyspnea or palpitations.  His son is at bedside.  All questions answered to the best of my ability.  Assessment & Plan:   Principal Problem:   Bilateral pleural effusion Active Problems:   Essential hypertension   AAA (abdominal aortic aneurysm) without rupture (HCC)   ESRD (end stage renal disease) (HCC)   Cirrhosis of liver (HCC)   Fluid overload, unspecified   Thrombocytopenia (HCC)   Thoracic aortic aneurysm (HCC)   Elevated troponin   Chest pain   CAP (community acquired pneumonia)   A-fib ([place])   1. Bilateral pleural effusions; acute on chronic diastolic  CHF combined with acute systolic CHF: - BNP >2671 in ED, reports recent wt gain, had EF 60-65% on yr ago but now 25 to 30%.  Underwent left heart cath 09/18/2020 and found to have three-vessel disease.  Cardiology has consulted CT surgery for possible consideration of CABG.  Further management per CT surgery and cardiology combined.   2. ESRD: Left HD after 30 minutes on the day of admission to come to ED for evaluation of right-sided chest pain.  Nephrology on board now.  Management per them.   3.  Liver cirrhosis  - Appears compensated    4.  Right-sided chest pain: Denies any chest pain today.  CTA negative for PE.  New diagnosis of acute CHF.   5. Elevated troponin: Slightly elevated but flat, not indicative of ACS.   6. Hx AAA s/p endovascular repair, - CTA in ED negative for dissection but notable for 4.1 cm ascending thoracic aortic aneurysm and patent aortic stent graft with 6.1 cm native sac diameter up from 5.9 cm. outpatient follow-up recommended.  Hold off to vascular consultation until his cardiac issues are fixed.   7. Thrombocytopenia - Platelets 90k in ED, appears chronic, no apparent bleeding or infection, likely related to chronic liver disease numbers are stable.   8.  Community-acquired pneumonia: Chest x-ray also suspicious for bilateral, left greater than right airspace disease.  Continue Unasyn.  9.  New onset atrial fibrillation: After dialysis on the evening of 09/17/2020, patient was noted to have developed new onset of atrial fibrillation.  Cardiology was consulted. spontaneously  converted back to normal sinus rhythm.  Still in normal sinus rhythm.  Cardiology on board and managing.  Patient on Toprol-XL, Isorbid and aspirin.  He remains on heparin drip due to severe CAD.  10.  Dyslipidemia: Now on atorvastatin 80 mg p.o. daily.  11.  Resolved post repletion: Hypokalemia.  12.  Multivessel coronary disease with high-grade LAD and right coronary stenosis with diffuse  calcified coronary arteries.  Complicated by severe LV systolic dysfunction with EF 25 to 30% and mild RV dysfunction.  He was seen by CTS, does not think he is a candidate for CABG due to his severe comorbidities including advanced age, ESRD on HD, cirrhosis with portal hypertension, recurrent problems with ascites and esophageal varices requiring multiple paracentesis procedures and is official pending, malnutrition with low albumin of 2.8 and severe LV systolic dysfunction.  PCT has his operative mortality will be prohibitive.  13.  Anemia of chronic disease.  Hemoglobin stable 9.5.  No overt bleeding.  14.  Moderate protein calorie malnutrition Albumin 2.8, BMI 21, encourage oral protein calorie intake.  DVT prophylaxis: SCD's Start: 09/18/20 1801   Code Status: Full Code  Family Communication: None present at bedside.  Plan of care discussed with patient in length and he verbalized understanding and agreed with it.  Status is: Inpatient  Remains inpatient appropriate because:Inpatient level of care appropriate due to severity of illness  Dispo: The patient is from: Home              Anticipated d/c is to: Home              Patient currently is not medically stable to d/c.   Difficult to place patient No   Estimated body mass index is 21.53 kg/m as calculated from the following:   Height as of this encounter: 6' (1.829 m).   Weight as of this encounter: 72 kg.   Nutritional status:  Consultants:  Nephrology Cardiology CTS  Procedures:  Left and right heart cath.  Antimicrobials:  Anti-infectives (From admission, onward)    Start     Dose/Rate Route Frequency Ordered Stop   09/17/20 0000  amoxicillin-clavulanate (AUGMENTIN) 500-125 MG tablet        1 tablet Oral 2 times daily 09/17/20 0945 09/23/20 2359   09/16/20 1600  ampicillin-sulbactam (UNASYN) 1.5 g in sodium chloride 0.9 % 100 mL IVPB        1.5 g 200 mL/hr over 30 Minutes Intravenous Every 24 hours 09/16/20  0931 09/21/20 1559   09/16/20 0800  cefTRIAXone (ROCEPHIN) 1 g in sodium chloride 0.9 % 100 mL IVPB  Status:  Discontinued        1 g 200 mL/hr over 30 Minutes Intravenous Every 24 hours 09/16/20 0753 09/16/20 0903   09/16/20 0800  azithromycin (ZITHROMAX) 250 mg in dextrose 5 % 125 mL IVPB  Status:  Discontinued        250 mg 125 mL/hr over 60 Minutes Intravenous Every 24 hours 09/16/20 0753 09/16/20 0903          Objective: Vitals:   09/19/20 1655 09/19/20 2100 09/20/20 0545 09/20/20 1422  BP: 130/73 112/73 100/64 106/62  Pulse: 93 86 76 77  Resp:  18 18 16   Temp:  98.5 F (36.9 C) 98 F (36.7 C) 98.3 F (36.8 C)  TempSrc:  Oral Oral Oral  SpO2:  90%  94%  Weight:   72 kg   Height:       No intake or output  data in the 24 hours ending 09/20/20 1609 Filed Weights   09/19/20 1245 09/19/20 1600 09/20/20 0545  Weight: 74.6 kg 72.3 kg 72 kg    Examination:  General exam: Frail-appearing no acute distress.  Is alert and interactive. Respiratory system: Clear to auscultation no wheezes or rales.  Good inspiratory effort. Cardiovascular system: Regular rate and rhythm no rubs or gallops.  No JVD thyromegaly noted.   Gastrointestinal system: Soft nontender normal bowel sounds..  Nondistended.   Central nervous system: Alert and oriented.  No focal motor neurodeficit.   Skin: No rashes lesions or ulcers. Psychiatry: Mood is appropriate for condition and setting.  Neurology: No focal motor deficits.   Data Reviewed: I have personally reviewed following labs and imaging studies  CBC: Recent Labs  Lab 09/15/20 1526 09/15/20 2025 09/16/20 0335 09/17/20 0319 09/18/20 0107 09/18/20 1706 09/18/20 1710 09/19/20 0358 09/20/20 0505  WBC 6.7   < > 7.3 6.9 5.0  --   --  6.1 5.3  NEUTROABS 5.8  --   --   --   --   --   --   --   --   HGB 10.2*   < > 9.8* 10.3* 10.2* 9.9*  10.2* 9.9* 10.4* 9.5*  HCT 33.2*   < > 30.9* 32.1* 31.8* 29.0*  30.0* 29.0* 32.1* 29.6*  MCV 108.1*    < > 107.3* 104.6* 104.3*  --   --  104.6* 102.8*  PLT 90*   < > 87* 88* 87*  --   --  120* 111*   < > = values in this interval not displayed.   Basic Metabolic Panel: Recent Labs  Lab 09/15/20 2025 09/16/20 0335 09/17/20 0319 09/18/20 0107 09/18/20 1706 09/18/20 1710 09/19/20 0358 09/20/20 0505  NA 137 135 137 138 138  139 139 134* 135  K 3.4* 3.7 3.4* 3.3* 3.9  3.9 3.8 3.9 3.5  CL 99 99 99 99  --   --  99 98  CO2 24 25 28 28   --   --  23 26  GLUCOSE 90 81 99 141*  --   --  91 105*  BUN 31* 34* 18 12  --   --  23 13  CREATININE 7.61* 7.93* 4.86* 3.54*  --   --  5.34* 4.06*  CALCIUM 8.0* 8.0* 8.0* 8.2*  --   --  8.0* 8.5*  MG  --   --   --  1.8  --   --   --   --   PHOS 4.2  --   --   --   --   --   --   --    GFR: Estimated Creatinine Clearance: 15.3 mL/min (A) (by C-G formula based on SCr of 4.06 mg/dL (H)). Liver Function Tests: Recent Labs  Lab 09/15/20 1526 09/15/20 2025  AST 25  --   ALT 14  --   ALKPHOS 108  --   BILITOT 1.0  --   PROT 6.4*  --   ALBUMIN 3.4* 2.8*   Recent Labs  Lab 09/15/20 1526  LIPASE 27   No results for input(s): AMMONIA in the last 168 hours. Coagulation Profile: Recent Labs  Lab 09/15/20 1526  INR 1.2   Cardiac Enzymes: No results for input(s): CKTOTAL, CKMB, CKMBINDEX, TROPONINI in the last 168 hours. BNP (last 3 results) No results for input(s): PROBNP in the last 8760 hours. HbA1C: No results for input(s): HGBA1C in the last 72 hours. CBG:  No results for input(s): GLUCAP in the last 168 hours. Lipid Profile: Recent Labs    09/19/20 0358  CHOL 133  HDL 38*  LDLCALC 74  TRIG 103  CHOLHDL 3.5   Thyroid Function Tests: Recent Labs    09/18/20 0107  TSH 4.650*   Anemia Panel: No results for input(s): VITAMINB12, FOLATE, FERRITIN, TIBC, IRON, RETICCTPCT in the last 72 hours. Sepsis Labs: Recent Labs  Lab 09/16/20 0335  PROCALCITON 0.60    Recent Results (from the past 240 hour(s))  Resp Panel by RT-PCR  (Flu A&B, Covid) Nasopharyngeal Swab     Status: None   Collection Time: 09/15/20  4:14 PM   Specimen: Nasopharyngeal Swab; Nasopharyngeal(NP) swabs in vial transport medium  Result Value Ref Range Status   SARS Coronavirus 2 by RT PCR NEGATIVE NEGATIVE Final    Comment: (NOTE) SARS-CoV-2 target nucleic acids are NOT DETECTED.  The SARS-CoV-2 RNA is generally detectable in upper respiratory specimens during the acute phase of infection. The lowest concentration of SARS-CoV-2 viral copies this assay can detect is 138 copies/mL. A negative result does not preclude SARS-Cov-2 infection and should not be used as the sole basis for treatment or other patient management decisions. A negative result may occur with  improper specimen collection/handling, submission of specimen other than nasopharyngeal swab, presence of viral mutation(s) within the areas targeted by this assay, and inadequate number of viral copies(<138 copies/mL). A negative result must be combined with clinical observations, patient history, and epidemiological information. The expected result is Negative.  Fact Sheet for Patients:  EntrepreneurPulse.com.au  Fact Sheet for Healthcare Providers:  IncredibleEmployment.be  This test is no t yet approved or cleared by the [place] FDA and  has been authorized for detection and/or diagnosis of SARS-CoV-2 by FDA under an Emergency Use Authorization (EUA). This EUA will remain  in effect (meaning this test can be used) for the duration of the COVID-19 declaration under Section 564(b)(1) of the Act, 21 U.S.C.section 360bbb-3(b)(1), unless the authorization is terminated  or revoked sooner.       Influenza A by PCR NEGATIVE NEGATIVE Final   Influenza B by PCR NEGATIVE NEGATIVE Final    Comment: (NOTE) The Xpert Xpress SARS-CoV-2/FLU/RSV plus assay is intended as an aid in the diagnosis of influenza from Nasopharyngeal swab specimens  and should not be used as a sole basis for treatment. Nasal washings and aspirates are unacceptable for Xpert Xpress SARS-CoV-2/FLU/RSV testing.  Fact Sheet for Patients: EntrepreneurPulse.com.au  Fact Sheet for Healthcare Providers: IncredibleEmployment.be  This test is not yet approved or cleared by the [place] FDA and has been authorized for detection and/or diagnosis of SARS-CoV-2 by FDA under an Emergency Use Authorization (EUA). This EUA will remain in effect (meaning this test can be used) for the duration of the COVID-19 declaration under Section 564(b)(1) of the Act, 21 U.S.C. section 360bbb-3(b)(1), unless the authorization is terminated or revoked.  Performed at [facility], [place] [address] [place], [place] [address]        Radiology Studies: No results found.  Scheduled Meds:  aspirin EC  81 mg Oral Daily   atorvastatin  80 mg Oral QHS   isosorbide mononitrate  15 mg Oral Daily   lidocaine  1 patch Transdermal Q24H   metoprolol succinate  25 mg Oral Daily   sodium chloride flush  3 mL Intravenous Q12H   sodium chloride flush  3 mL Intravenous Q12H   spironolactone  12.5 mg Oral Daily  Continuous Infusions:  sodium chloride     sodium chloride     sodium chloride     sodium chloride     ampicillin-sulbactam (UNASYN) IV 1.5 g (09/20/20 1535)   heparin 1,500 Units/hr (09/19/20 2306)     LOS: 5 days   Time spent: 30 minutes   Kayleen Memos, MD [facility]  09/20/2020, 4:09 PM   How to contact the [facility] Attending or Consulting provider English or covering provider during after hours [place], for this patient?  Check the care team in [facility] and look for a) attending/consulting [facility] provider listed and b) the [facility] team listed. Page or secure chat [phone]. Log into [email or web address] and use [place]'s universal password to access. If you do not have the password, please contact the hospital operator. Locate  the [facility] provider you are looking for under [facility] and page to a number that you can be directly reached. If you still have difficulty reaching the provider, please page the [facility] (Director on Call) for the Hospitalists listed on amion for assistance.

## 2020-09-20 NOTE — Progress Notes (Signed)
[place] for heparin Indication:  Afib and r/o ACS  Patient Measurements: Height: 6' (182.9 cm) Weight: 72 kg (158 lb 11.7 oz) IBW/kg (Calculated) : 77.6 Heparin Dosing Wt: 72 kg  Vital Signs: Temp: 98 F (36.7 C) (07/24 0545) Temp Source: Oral (07/24 0545) BP: 100/64 (07/24 0545) Pulse Rate: 76 (07/24 0545)  Labs: Recent Labs    09/18/20 0107 09/18/20 0216 09/18/20 0458 09/18/20 1055 09/18/20 1710 09/19/20 0358 09/19/20 1140 09/19/20 2139 09/20/20 0505 09/20/20 1307  HGB 10.2*  --   --    < > 9.9* 10.4*  --   --  9.5*  --   HCT 31.8*  --   --    < > 29.0* 32.1*  --   --  29.6*  --   PLT 87*  --   --   --   --  120*  --   --  111*  --   HEPARINUNFRC  --   --   --    < >  --   --    < > 0.28* 0.39 0.38  CREATININE 3.54*  --   --   --   --  5.34*  --   --  4.06*  --   TROPONINIHS  --  103* 110*  --   --   --   --   --   --   --    < > = values in this interval not displayed.     Estimated Creatinine Clearance: 15.3 mL/min (A) (by C-G formula based on SCr of 4.06 mg/dL (H)).   Medications:  Medications Prior to Admission  Medication Sig Dispense Refill Last Dose   amLODipine (NORVASC) 5 MG tablet Take 5 mg by mouth daily.   09/15/2020   atorvastatin (LIPITOR) 10 MG tablet Take 10 mg by mouth daily.   09/15/2020   carvedilol (COREG) 12.5 MG tablet Take 12.5 mg by mouth 2 (two) times daily.   09/15/2020 at 0900   febuxostat (ULORIC) 40 MG tablet Take 1 tablet (40 mg total) by mouth daily. 30 tablet 3 Past Month   lactulose (CHRONULAC) 10 GM/15ML solution Take 15 mLs (10 g total) by mouth daily. 236 mL 1 Past Month   multivitamin (RENA-VIT) TABS tablet Take 1 tablet by mouth daily. 30 tablet 0 09/15/2020   calcitRIOL (ROCALTROL) 0.25 MCG capsule Take 1 capsule (0.25 mcg total) by mouth every Tuesday, Thursday, and Saturday at 6 PM. (Patient not taking: No sig reported) 30 capsule 3 Not Taking   calcium acetate (PHOSLO) 667 MG capsule  Take 1 capsule (667 mg total) by mouth 3 (three) times daily with meals. 90 capsule 2    cloNIDine (CATAPRES) 0.1 MG tablet Take 1 tablet (0.1 mg total) by mouth daily. (Patient not taking: No sig reported) 30 tablet 0 Not Taking   pantoprazole (PROTONIX) 40 MG tablet Take 1 tablet (40 mg total) by mouth daily. 30 tablet 1    traMADol (ULTRAM) 50 MG tablet Take 1 tablet (50 mg total) by mouth every 8 (eight) hours as needed for moderate pain. (Patient not taking: Reported on 09/17/2020) 8 tablet 0 Not Taking   Scheduled:   aspirin EC  81 mg Oral Daily   atorvastatin  80 mg Oral QHS   isosorbide mononitrate  15 mg Oral Daily   lidocaine  1 patch Transdermal Q24H   metoprolol succinate  25 mg Oral Daily   sodium chloride flush  3 mL  Intravenous Q12H   sodium chloride flush  3 mL Intravenous Q12H   spironolactone  12.5 mg Oral Daily   Infusions:   sodium chloride     sodium chloride     sodium chloride     sodium chloride     ampicillin-sulbactam (UNASYN) IV 1.5 g (09/19/20 1652)   heparin 1,500 Units/hr (09/19/20 2306)    Assessment: 80yo male admitted for acute on chronic CHF w/new onset Afib. Cath showed 3vCAD, consulting CVTS for possible CABG. Pt also w/history of cirrhosis and esophageal varices, will need to evaluate need for long-term anticoagulation and consider bleed vs clot risk.   Confirmatory heparin level 0.38 and remains therapeutic.   Goal of Therapy:  Heparin level 0.3-0.7 units/ml Monitor platelets by anticoagulation protocol: Yes   Plan:  Continue IV heparin gtt at 1500 units/hr Daily heparin level, cbc Monitor for s/sx of bleeding F/u long-term anticoagulation plan  Thank you for involving pharmacy in this patient's care.  Elita Quick, PharmD PGY1 Ambulatory Care Pharmacy Resident 09/20/2020 1:57 PM  **Pharmacist phone directory can be found on [email or web address] listed under [place]**

## 2020-09-20 NOTE — Progress Notes (Signed)
[facility] Progress Note   Subjective:   Seen in room. Completed dialysis yesterday. Net UF 2L. No issues. No complaints this am.   Objective Vitals:   09/19/20 1600 09/19/20 1655 09/19/20 2100 09/20/20 0545  BP: 118/72 130/73 112/73 100/64  Pulse:  93 86 76  Resp:   18 18  Temp: 97.9 F (36.6 C)  98.5 F (36.9 C) 98 F (36.7 C)  TempSrc: Oral  Oral Oral  SpO2:   90%   Weight: 72.3 kg   72 kg  Height:       Physical Exam General: Well appearing man, NAD. Room air. Heart: RRR; no murmur Lungs: CTAB Abdomen: soft, non-tender Extremities: No LE edema Dialysis Access: L AVF + bruit  Additional Objective Labs: Basic Metabolic Panel: Recent Labs  Lab 09/15/20 2025 09/16/20 0335 09/18/20 0107 09/18/20 1706 09/18/20 1710 09/19/20 0358 09/20/20 0505  NA 137   < > 138   < > 139 134* 135  K 3.4*   < > 3.3*   < > 3.8 3.9 3.5  CL 99   < > 99  --   --  99 98  CO2 24   < > 28  --   --  23 26  GLUCOSE 90   < > 141*  --   --  91 105*  BUN 31*   < > 12  --   --  23 13  CREATININE 7.61*   < > 3.54*  --   --  5.34* 4.06*  CALCIUM 8.0*   < > 8.2*  --   --  8.0* 8.5*  PHOS 4.2  --   --   --   --   --   --    < > = values in this interval not displayed.    Liver Function Tests: Recent Labs  Lab 09/15/20 1526 09/15/20 2025  AST 25  --   ALT 14  --   ALKPHOS 108  --   BILITOT 1.0  --   PROT 6.4*  --   ALBUMIN 3.4* 2.8*    Recent Labs  Lab 09/15/20 1526  LIPASE 27    CBC: Recent Labs  Lab 09/15/20 1526 09/15/20 2025 09/16/20 0335 09/17/20 0319 09/18/20 0107 09/18/20 1706 09/18/20 1710 09/19/20 0358 09/20/20 0505  WBC 6.7   < > 7.3 6.9 5.0  --   --  6.1 5.3  NEUTROABS 5.8  --   --   --   --   --   --   --   --   HGB 10.2*   < > 9.8* 10.3* 10.2*   < > 9.9* 10.4* 9.5*  HCT 33.2*   < > 30.9* 32.1* 31.8*   < > 29.0* 32.1* 29.6*  MCV 108.1*   < > 107.3* 104.6* 104.3*  --   --  104.6* 102.8*  PLT 90*   < > 87* 88* 87*  --   --  120* 111*   <  > = values in this interval not displayed.   Studies/Results: CARDIAC CATHETERIZATION  Result Date: 09/18/2020 Formatting of this result is different from the original.   Prox LAD to Mid LAD lesion is 85% stenosed.   Mid LAD lesion is 50% stenosed.   Dist LAD lesion is 20% stenosed.   Prox Cx to Mid Cx lesion is 65% stenosed.   Prox RCA lesion is 95% stenosed.   Mid RCA lesion is 90% stenosed.   LV end  diastolic pressure is mildly elevated.   Hemodynamic findings consistent with mild pulmonary hypertension. There is severe coronary calcification involving the LAD and right coronary artery with mild calcification of the left circumflex vessel. 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 Mild right heart pressure elevation with mild pulmonary hypertension. RECOMMENDATION: The patient has severe coronary calcification and three-vessel disease.  Echo Doppler has demonstrated reduced EF in the 25 to 30% range.  Recommend initial surgical consultation for consideration of possible CABG revascularization surgery.  However with the patient's significant comorbidities, if surgery is excluded, will need to review with colleagues regarding potential orbital atherectomy and intervention into the LAD and RCA.  We will consider reinstitution of low-dose heparin much later today and this patient with recent PAF and ulcerated plaque in the proximal RCA.   Medications:  sodium chloride     sodium chloride     sodium chloride     sodium chloride     ampicillin-sulbactam (UNASYN) IV 1.5 g (09/19/20 1652)   heparin 1,500 Units/hr (09/19/20 2306)    aspirin EC  81 mg Oral  Daily   atorvastatin  80 mg Oral QHS   isosorbide mononitrate  15 mg Oral Daily   lidocaine  1 patch Transdermal Q24H   metoprolol succinate  25 mg Oral Daily   sodium chloride flush  3 mL Intravenous Q12H   sodium chloride flush  3 mL Intravenous Q12H    Dialysis Orders: TTS [place] 4hr, 350/500, EDW 77.5kg, 3K/2.5Ca, AVF, heparin 3000 bolus - Venofer 50mg  IV q week - Calcitriol 0.35mcg PO q HD   Assessment/Plan:  Chest pain/B pleural effusions/pneumonia: CTA neg for PE/dissection. S/p extra HD on 7/20. On Unasyn for pneumonia coverage.   ESRD: Usual TTS schedule , s/p extra HD 7/20. Next HD on Sat 7/23.  Hypertension/volume: BP low/normal, on metoprolol for now.  Anemia: Hgb 9.5 - follow without ESA for now.  Metabolic bone disease: Ca/Phos ok - continue home binders/VDRA.  Cirrhosis/thrombocytopenia  Elevated Trop: Follow trends  AAA/new ascending thoracic AA: For OP follow-up per notes.  Thrombocytopenia - improving.   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 Per cards to have CT surgery evaluate for CABG   New A-Fib: Per cards. Remains on heparin gtt   Lynnda Child PA-C [facility] 09/20/2020,8:28 AM

## 2020-09-20 NOTE — Progress Notes (Addendum)
[place] for heparin Indication:  Afib and r/o ACS  Patient Measurements: Height: 6' (182.9 cm) Weight: 72 kg (158 lb 11.7 oz) IBW/kg (Calculated) : 77.6 Heparin Dosing Wt: 72 kg  Vital Signs: Temp: 98 F (36.7 C) (07/24 0545) Temp Source: Oral (07/24 0545) BP: 100/64 (07/24 0545) Pulse Rate: 76 (07/24 0545)  Labs: Recent Labs    09/18/20 0107 09/18/20 0216 09/18/20 0458 09/18/20 1055 09/18/20 1710 09/19/20 0358 09/19/20 1140 09/19/20 2139 09/20/20 0505  HGB 10.2*  --   --    < > 9.9* 10.4*  --   --  9.5*  HCT 31.8*  --   --    < > 29.0* 32.1*  --   --  29.6*  PLT 87*  --   --   --   --  120*  --   --  111*  HEPARINUNFRC  --   --   --    < >  --   --  0.16* 0.28* 0.39  CREATININE 3.54*  --   --   --   --  5.34*  --   --  4.06*  TROPONINIHS  --  103* 110*  --   --   --   --   --   --    < > = values in this interval not displayed.     Estimated Creatinine Clearance: 15.3 mL/min (A) (by C-G formula based on SCr of 4.06 mg/dL (H)).   Medications:  Medications Prior to Admission  Medication Sig Dispense Refill Last Dose   amLODipine (NORVASC) 5 MG tablet Take 5 mg by mouth daily.   09/15/2020   atorvastatin (LIPITOR) 10 MG tablet Take 10 mg by mouth daily.   09/15/2020   carvedilol (COREG) 12.5 MG tablet Take 12.5 mg by mouth 2 (two) times daily.   09/15/2020 at 0900   febuxostat (ULORIC) 40 MG tablet Take 1 tablet (40 mg total) by mouth daily. 30 tablet 3 Past Month   lactulose (CHRONULAC) 10 GM/15ML solution Take 15 mLs (10 g total) by mouth daily. 236 mL 1 Past Month   multivitamin (RENA-VIT) TABS tablet Take 1 tablet by mouth daily. 30 tablet 0 09/15/2020   calcitRIOL (ROCALTROL) 0.25 MCG capsule Take 1 capsule (0.25 mcg total) by mouth every Tuesday, Thursday, and Saturday at 6 PM. (Patient not taking: No sig reported) 30 capsule 3 Not Taking   calcium acetate (PHOSLO) 667 MG capsule Take 1 capsule (667 mg total) by mouth 3  (three) times daily with meals. 90 capsule 2    cloNIDine (CATAPRES) 0.1 MG tablet Take 1 tablet (0.1 mg total) by mouth daily. (Patient not taking: No sig reported) 30 tablet 0 Not Taking   pantoprazole (PROTONIX) 40 MG tablet Take 1 tablet (40 mg total) by mouth daily. 30 tablet 1    traMADol (ULTRAM) 50 MG tablet Take 1 tablet (50 mg total) by mouth every 8 (eight) hours as needed for moderate pain. (Patient not taking: Reported on 09/17/2020) 8 tablet 0 Not Taking   Scheduled:   aspirin EC  81 mg Oral Daily   atorvastatin  80 mg Oral QHS   isosorbide mononitrate  15 mg Oral Daily   lidocaine  1 patch Transdermal Q24H   metoprolol succinate  25 mg Oral Daily   sodium chloride flush  3 mL Intravenous Q12H   sodium chloride flush  3 mL Intravenous Q12H   Infusions:   sodium chloride  sodium chloride     sodium chloride     sodium chloride     ampicillin-sulbactam (UNASYN) IV 1.5 g (09/19/20 1652)   heparin 1,500 Units/hr (09/19/20 2306)    Assessment: 80yo male admitted for acute on chronic CHF w/new onset Afib. Cath showed 3vCAD, consulting CVTS for possible CABG. Pt also w/history of cirrhosis and esophageal varices, will need to evaluate need for long-term anticoagulation and consider bleed vs clot risk.   Heparin level 0.39 and therapeutic. Heparin level obtained earlier at 6h, will recheck 8h level. Hgb/hct decreased slightly but still stable, plt decreased slightly but still stable. No bleeding or IV site issues per RN.   Goal of Therapy:  Heparin level 0.3-0.7 units/ml Monitor platelets by anticoagulation protocol: Yes   Plan:  Continue IV heparin gtt at 1500 units/hr Confirmatory 8h heparin level check Daily heparin level, cbc Monitor for s/sx of bleeding F/u long-term anticoagulation plan  Thank you for involving pharmacy in this patient's care.  Elita Quick, PharmD PGY1 Ambulatory Care Pharmacy Resident 09/20/2020 8:33 AM  **Pharmacist phone directory can  be found on [email or web address] listed under [email or web address]**

## 2020-09-20 NOTE — Consult Note (Signed)
[address]       [place] [address]             [phone]      Cardiothoracic Surgery Consultation  Reason for Consult: Multi-vessel coronary artery disease Referring Physician: Dr. Shelva Majestic  Ricky Harris is an 80 y.o. male.  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  CTA of the chest, abdomen, and pelvis was negative for pulmonary embolism or aortic dissection but did show bilateral moderate pleural effusions.  Troponin was mildly elevated but BNP was greater than 4500.  2D echocardiogram showed a left ventricular ejection fraction of 25 to 30% with global hypokinesis.  There is mild RV dysfunction.  He underwent cardiac catheterization on Friday afternoon showing 85% proximal and mid LAD stenosis with a heavily calcified vessel.  Left circumflex had 65% proximal and mid vessel stenosis and a heavily calcified vessel.  The RCA had 95% proximal and 90% mid vessel stenosis and a heavily calcified vessel that had very small distal branches.  There was mild pulmonary hypertension at 39/20 with a mean of 27.  LVEDP was 19 and cardiac index was 3.6.  The patient lives with his son who is here with him today.  The patient denies any chest pain or pressure.  He does report exertional fatigue and shortness of breath which has only started recently.  He denies any dizziness or syncope.  Has had no peripheral edema.  Past Medical  History:  Diagnosis Date   A-fib ([place]) 09/18/2020   AAA (abdominal aortic aneurysm) ([place])    5.3cm , magd by vascular Dr Scot Dock ,   Anemia    " slightly"   Aortic stenosis    Arthritis    knees   Ascites    Cancer ([place])    prostate   Chronic kidney disease    stage 4, mgd by Dr Justin Mend    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[facility])    Coronary artery disease    on CT scan   Gout    H/O hypercholesterolemia    Heart murmur    History of falling    recent- see ct chest results of 04/25/2018   Hypertension    Hypothyroidism    Liver cirrhosis ([place])    Prediabetes    pt denies    Renal artery stenosis ([facility])    Thrombocytopenia ([place])    Wears glasses    Wears glasses     Past Surgical History:  Procedure Laterality Date   ABDOMINAL AORTIC ENDOVASCULAR STENT GRAFT N/A 01/21/2019   Procedure: ABDOMINAL AORTIC ENDOVASCULAR STENT GRAFT WITH CO2;  Surgeon: Angelia Mould, MD;  Location: [facility];  Service: Vascular;  Laterality: N/A;   ABDOMINAL AORTOGRAM W/LOWER EXTREMITY Bilateral 12/14/2018   Procedure: ABDOMINAL AORTOGRAM W/LOWER EXTREMITY;  Surgeon: Angelia Mould, MD;  Location: [facility];  Service: Cardiovascular;  Laterality: Bilateral;   AV FISTULA PLACEMENT Left 03/12/2019   Procedure: LEFT ARTERIOVENOUS Arteriovenous FISTULA CREATION.;  Surgeon: Angelia Mould, MD;  Location: [facility];  Service: Vascular;  Laterality: Left;   CATARACT EXTRACTION W/ INTRAOCULAR LENS  IMPLANT, BILATERAL     CHOLECYSTECTOMY     ESOPHAGEAL BANDING N/A 08/11/2017   Procedure: ESOPHAGEAL BANDING;  Surgeon: Otis Brace, MD;  Location: [facility];  Service: Gastroenterology;  Laterality: N/A;   ESOPHAGEAL BANDING N/A 10/26/2017   Procedure: ESOPHAGEAL BANDING;  Surgeon: Otis Brace, MD;  [facility];  Service: Gastroenterology;  Laterality: N/A;   ESOPHAGEAL BANDING N/A 12/19/2017   Procedure: ESOPHAGEAL BANDING;  Surgeon: Otis Brace, MD;   [facility];  Service: Gastroenterology;  Laterality: N/A;   ESOPHAGOGASTRODUODENOSCOPY (EGD) WITH PROPOFOL N/A 07/05/2017   Procedure: ESOPHAGOGASTRODUODENOSCOPY (EGD) WITH PROPOFOL;  Surgeon: Otis Brace, MD;  Location: [facility];  Service: Gastroenterology;  Laterality: N/A;   ESOPHAGOGASTRODUODENOSCOPY (EGD) WITH PROPOFOL N/A 08/11/2017   Procedure: ESOPHAGOGASTRODUODENOSCOPY (EGD) WITH PROPOFOL;  Surgeon: Otis Brace, MD;  Location: [facility];  Service: Gastroenterology;  Laterality: N/A;   ESOPHAGOGASTRODUODENOSCOPY (EGD) WITH PROPOFOL N/A 10/26/2017   Procedure: ESOPHAGOGASTRODUODENOSCOPY (EGD) WITH PROPOFOL;  Surgeon: Otis Brace, MD;  [facility];  Service: Gastroenterology;  Laterality: N/A;   ESOPHAGOGASTRODUODENOSCOPY (EGD) WITH PROPOFOL N/A 12/19/2017   Procedure: ESOPHAGOGASTRODUODENOSCOPY (EGD) WITH PROPOFOL;  Surgeon: Otis Brace, MD;  [facility];  Service: Gastroenterology;  Laterality: N/A;   ESOPHAGOGASTRODUODENOSCOPY (EGD) WITH PROPOFOL N/A 10/29/2018   Procedure: ESOPHAGOGASTRODUODENOSCOPY (EGD) WITH PROPOFOL;  Surgeon: Otis Brace, MD;  [facility];  Service: Gastroenterology;  Laterality: N/A;   ESOPHAGOGASTRODUODENOSCOPY (EGD) WITH PROPOFOL N/A 02/14/2020   Procedure: ESOPHAGOGASTRODUODENOSCOPY (EGD) WITH PROPOFOL;  Surgeon: Otis Brace, MD;  [facility];  Service: Gastroenterology;  Laterality: N/A;   EYE SURGERY     bilateral cataract removal with lens placement   HERNIA REPAIR     IR PARACENTESIS  12/26/2018   IR PARACENTESIS  01/02/2019   IR PARACENTESIS  01/15/2019   IR PARACENTESIS  01/30/2019   IR PARACENTESIS  02/05/2019   IR PARACENTESIS  02/14/2019   IR PARACENTESIS  02/28/2019   IR PARACENTESIS  03/14/2019   IR PARACENTESIS  03/29/2019   IR PARACENTESIS  04/11/2019   IR PARACENTESIS  04/25/2019   IR PARACENTESIS  05/09/2019   IR PARACENTESIS  06/03/2019   IR PARACENTESIS   06/06/2019   IR PARACENTESIS  06/20/2019   IR PARACENTESIS  07/03/2019   IR PARACENTESIS  07/19/2019   IR PARACENTESIS  08/08/2019   IR PARACENTESIS  08/27/2019   IR PARACENTESIS  09/09/2019   IR PARACENTESIS  09/24/2019   IR PARACENTESIS  10/07/2019   IR PARACENTESIS  10/23/2019   IR PARACENTESIS  11/07/2019   IR PARACENTESIS  11/21/2019   IR PARACENTESIS  12/04/2019   IR PARACENTESIS  12/25/2019   IR PARACENTESIS  01/10/2020   IR PARACENTESIS  01/27/2020   IR PARACENTESIS  02/19/2020   IR PARACENTESIS  03/27/2020   PROSTATECTOMY     subtotal gastrectomy      Family History  Problem Relation Age of Onset   Diabetes Mellitus II Mother    Stroke Mother     Social History:  reports that he has never smoked. He has never used smokeless tobacco. He reports previous alcohol use. He reports that he does not use drugs.  Allergies:  Allergies  Allergen Reactions   Cephalexin Hives   Hydralazine Hives    Medications: I have reviewed the patient's current medications. Prior to Admission:  Medications Prior to Admission  Medication Sig Dispense Refill Last Dose   amLODipine (NORVASC) 5 MG  tablet Take 5 mg by mouth daily.   09/15/2020   atorvastatin (LIPITOR) 10 MG tablet Take 10 mg by mouth daily.   09/15/2020   carvedilol (COREG) 12.5 MG tablet Take 12.5 mg by mouth 2 (two) times daily.   09/15/2020 at 0900   febuxostat (ULORIC) 40 MG tablet Take 1 tablet (40 mg total) by mouth daily. 30 tablet 3 Past Month   lactulose (CHRONULAC) 10 GM/15ML solution Take 15 mLs (10 g total) by mouth daily. 236 mL 1 Past Month   multivitamin (RENA-VIT) TABS tablet Take 1 tablet by mouth daily. 30 tablet 0 09/15/2020   calcitRIOL (ROCALTROL) 0.25 MCG capsule Take 1 capsule (0.25 mcg total) by mouth every Tuesday, Thursday, and Saturday at 6 PM. (Patient not taking: No sig reported) 30 capsule 3 Not Taking   calcium acetate (PHOSLO) 667 MG capsule Take 1 capsule (667 mg total) by mouth 3 (three) times daily with meals.  90 capsule 2    cloNIDine (CATAPRES) 0.1 MG tablet Take 1 tablet (0.1 mg total) by mouth daily. (Patient not taking: No sig reported) 30 tablet 0 Not Taking   pantoprazole (PROTONIX) 40 MG tablet Take 1 tablet (40 mg total) by mouth daily. 30 tablet 1    traMADol (ULTRAM) 50 MG tablet Take 1 tablet (50 mg total) by mouth every 8 (eight) hours as needed for moderate pain. (Patient not taking: Reported on 09/17/2020) 8 tablet 0 Not Taking   Scheduled:  aspirin EC  81 mg Oral Daily   atorvastatin  80 mg Oral QHS   isosorbide mononitrate  15 mg Oral Daily   lidocaine  1 patch Transdermal Q24H   metoprolol succinate  25 mg Oral Daily   sodium chloride flush  3 mL Intravenous Q12H   sodium chloride flush  3 mL Intravenous Q12H   spironolactone  12.5 mg Oral Daily   Continuous:  sodium chloride     sodium chloride     sodium chloride     sodium chloride     ampicillin-sulbactam (UNASYN) IV 1.5 g (09/19/20 1652)   heparin 1,500 Units/hr (09/19/20 2306)   OIN:OMVEHM chloride, sodium chloride, sodium chloride, sodium chloride, acetaminophen, alteplase, diazepam, fentaNYL (SUBLIMAZE) injection, heparin, lidocaine (PF), lidocaine-prilocaine, ondansetron (ZOFRAN) IV, pentafluoroprop-tetrafluoroeth, sodium chloride flush Anti-infectives (From admission, onward)    Start     Dose/Rate Route Frequency Ordered Stop   09/17/20 0000  amoxicillin-clavulanate (AUGMENTIN) 500-125 MG tablet        1 tablet Oral 2 times daily 09/17/20 0945 09/23/20 2359   09/16/20 1600  ampicillin-sulbactam (UNASYN) 1.5 g in sodium chloride 0.9 % 100 mL IVPB        1.5 g 200 mL/hr over 30 Minutes Intravenous Every 24 hours 09/16/20 0931 09/21/20 1559   09/16/20 0800  cefTRIAXone (ROCEPHIN) 1 g in sodium chloride 0.9 % 100 mL IVPB  Status:  Discontinued        1 g 200 mL/hr over 30 Minutes Intravenous Every 24 hours 09/16/20 0753 09/16/20 0903   09/16/20 0800  azithromycin (ZITHROMAX) 250 mg in dextrose 5 % 125 mL IVPB   Status:  Discontinued        250 mg 125 mL/hr over 60 Minutes Intravenous Every 24 hours 09/16/20 0753 09/16/20 0903       Results for orders placed or performed during the hospital encounter of 09/15/20 (from the past 48 hour(s))  POCT I-Stat EG7     Status: Abnormal   Collection Time: 09/18/20  5:06 PM  Result Value Ref Range   pH, Ven 7.447 (H) 7.250 - 7.430   pCO2, Ven 41.9 (L) 44.0 - 60.0 mmHg   pO2, Ven 35.0 32.0 - 45.0 mmHg   Bicarbonate 28.9 (H) 20.0 - 28.0 mmol/L   TCO2 30 22 - 32 mmol/L   O2 Saturation 69.0 %   Acid-Base Excess 4.0 (H) 0.0 - 2.0 mmol/L   Sodium 139 135 - 145 mmol/L   Potassium 3.9 3.5 - 5.1 mmol/L   Calcium, Ion 1.08 (L) 1.15 - 1.40 mmol/L   HCT 30.0 (L) 39.0 - 52.0 %   Hemoglobin 10.2 (L) 13.0 - 17.0 g/dL   Collection site art line    Drawn by Nurse    Sample type VENOUS    Comment NOTIFIED PHYSICIAN   POCT I-Stat EG7     Status: Abnormal   Collection Time: 09/18/20  5:06 PM  Result Value Ref Range   pH, Ven 7.455 (H) 7.250 - 7.430   pCO2, Ven 40.8 (L) 44.0 - 60.0 mmHg   pO2, Ven 33.0 32.0 - 45.0 mmHg   Bicarbonate 28.7 (H) 20.0 - 28.0 mmol/L   TCO2 30 22 - 32 mmol/L   O2 Saturation 67.0 %   Acid-Base Excess 4.0 (H) 0.0 - 2.0 mmol/L   Sodium 138 135 - 145 mmol/L   Potassium 3.9 3.5 - 5.1 mmol/L   Calcium, Ion 1.07 (L) 1.15 - 1.40 mmol/L   HCT 29.0 (L) 39.0 - 52.0 %   Hemoglobin 9.9 (L) 13.0 - 17.0 g/dL   Collection site Magazine features editor by Nurse    Sample type VENOUS    Comment NOTIFIED PHYSICIAN   I-STAT 7, (LYTES, BLD GAS, ICA, H+H)     Status: Abnormal   Collection Time: 09/18/20  5:10 PM  Result Value Ref Range   pH, Arterial 7.480 (H) 7.350 - 7.450   pCO2 arterial 37.8 32.0 - 48.0 mmHg   pO2, Arterial 71 (L) 83.0 - 108.0 mmHg   Bicarbonate 28.2 (H) 20.0 - 28.0 mmol/L   TCO2 29 22 - 32 mmol/L   O2 Saturation 95.0 %   Acid-Base Excess 4.0 (H) 0.0 - 2.0 mmol/L   Sodium 139 135 - 145 mmol/L   Potassium 3.8 3.5 - 5.1 mmol/L    Calcium, Ion 1.02 (L) 1.15 - 1.40 mmol/L   HCT 29.0 (L) 39.0 - 52.0 %   Hemoglobin 9.9 (L) 13.0 - 17.0 g/dL   Collection site art line    Drawn by Nurse    Sample type ARTERIAL   Lipid panel     Status: Abnormal   Collection Time: 09/19/20  3:58 AM  Result Value Ref Range   Cholesterol 133 0 - 200 mg/dL   Triglycerides 103 <150 mg/dL   HDL 38 (L) >40 mg/dL   Total CHOL/HDL Ratio 3.5 RATIO   VLDL 21 0 - 40 mg/dL   LDL Cholesterol 74 0 - 99 mg/dL    Comment:        Total Cholesterol/HDL:CHD Risk Coronary Heart Disease Risk Table                     Men   Women  1/2 Average Risk   3.4   3.3  Average Risk       5.0   4.4  2 X Average Risk   9.6   7.1  3 X Average Risk  23.4   11.0  Use the calculated Patient Ratio above and the CHD Risk Table to determine the patient's CHD Risk.        ATP III CLASSIFICATION (LDL):  <100     mg/dL   Optimal  100-129  mg/dL   Near or Above                    Optimal  130-159  mg/dL   Borderline  160-189  mg/dL   High  >190     mg/dL   Very High Performed at [facility] [address], [place], [place] [address]   CBC     Status: Abnormal   Collection Time: 09/19/20  3:58 AM  Result Value Ref Range   WBC 6.1 4.0 - 10.5 K/uL   RBC 3.07 (L) 4.22 - 5.81 MIL/uL   Hemoglobin 10.4 (L) 13.0 - 17.0 g/dL   HCT 32.1 (L) 39.0 - 52.0 %   MCV 104.6 (H) 80.0 - 100.0 fL   MCH 33.9 26.0 - 34.0 pg   MCHC 32.4 30.0 - 36.0 g/dL   RDW 16.2 (H) 11.5 - 15.5 %   Platelets 120 (L) 150 - 400 K/uL   nRBC 0.0 0.0 - 0.2 %    Comment: Performed at [facility] [address], [place], [place] [address]  Basic metabolic panel     Status: Abnormal   Collection Time: 09/19/20  3:58 AM  Result Value Ref Range   Sodium 134 (L) 135 - 145 mmol/L   Potassium 3.9 3.5 - 5.1 mmol/L   Chloride 99 98 - 111 mmol/L   CO2 23 22 - 32 mmol/L   Glucose, Bld 91 70 - 99 mg/dL    Comment: Glucose reference range applies only to samples taken after fasting for  at least 8 hours.   BUN 23 8 - 23 mg/dL   Creatinine, Ser 5.34 (H) 0.61 - 1.24 mg/dL    Comment: DELTA CHECK NOTED   Calcium 8.0 (L) 8.9 - 10.3 mg/dL   GFR, Estimated 10 (L) >60 mL/min    Comment: (NOTE) Calculated using the CKD-EPI Creatinine Equation (2021)    Anion gap 12 5 - 15    Comment: Performed at [facility] [address] [place], [place] [address]  Heparin level (unfractionated)     Status: Abnormal   Collection Time: 09/19/20 11:40 AM  Result Value Ref Range   Heparin Unfractionated 0.16 (L) 0.30 - 0.70 IU/mL    Comment: (NOTE) The clinical reportable range upper limit is being lowered to >1.10 to align with the FDA approved guidance for the current laboratory assay.  If heparin results are below expected values, and patient dosage has  been confirmed, suggest follow up testing of antithrombin III levels. Performed at [facility], [place] [address], [place], [place] [address]   Heparin level (unfractionated)     Status: Abnormal   Collection Time: 09/19/20  9:39 PM  Result Value Ref Range   Heparin Unfractionated 0.28 (L) 0.30 - 0.70 IU/mL    Comment: (NOTE) The clinical reportable range upper limit is being lowered to >1.10 to align with the FDA approved guidance for the current laboratory assay.  If heparin results are below expected values, and patient dosage has  been confirmed, suggest follow up testing of antithrombin III levels. Performed at [facility], [place] [name], [place], [place] [address]   Basic metabolic panel     Status: Abnormal  Collection Time: 09/20/20  5:05 AM  Result Value Ref Range   Sodium 135 135 - 145 mmol/L   Potassium 3.5 3.5 - 5.1 mmol/L   Chloride 98 98 - 111 mmol/L   CO2 26 22 - 32 mmol/L   Glucose, Bld 105 (H) 70 - 99 mg/dL    Comment: Glucose reference range applies only to samples taken after fasting for at least 8 hours.   BUN 13 8 - 23 mg/dL   Creatinine, Ser 4.06 (H) 0.61 - 1.24 mg/dL   Calcium  8.5 (L) 8.9 - 10.3 mg/dL   GFR, Estimated 14 (L) >60 mL/min    Comment: (NOTE) Calculated using the CKD-EPI Creatinine Equation (2021)    Anion gap 11 5 - 15    Comment: Performed at [facility] [address] [place], [place] [address]  CBC     Status: Abnormal   Collection Time: 09/20/20  5:05 AM  Result Value Ref Range   WBC 5.3 4.0 - 10.5 K/uL   RBC 2.88 (L) 4.22 - 5.81 MIL/uL   Hemoglobin 9.5 (L) 13.0 - 17.0 g/dL   HCT 29.6 (L) 39.0 - 52.0 %   MCV 102.8 (H) 80.0 - 100.0 fL   MCH 33.0 26.0 - 34.0 pg   MCHC 32.1 30.0 - 36.0 g/dL   RDW 15.7 (H) 11.5 - 15.5 %   Platelets 111 (L) 150 - 400 K/uL    Comment: Immature Platelet Fraction may be clinically indicated, consider ordering this additional test [id number] CONSISTENT WITH PREVIOUS RESULT REPEATED TO VERIFY    nRBC 0.0 0.0 - 0.2 %    Comment: Performed at [facility], [place] [address], [place], [place] [address]  Heparin level (unfractionated)     Status: None   Collection Time: 09/20/20  5:05 AM  Result Value Ref Range   Heparin Unfractionated 0.39 0.30 - 0.70 IU/mL    Comment: (NOTE) The clinical reportable range upper limit is being lowered to >1.10 to align with the FDA approved guidance for the current laboratory assay.  If heparin results are below expected values, and patient dosage has  been confirmed, suggest follow up testing of antithrombin III levels. Performed at [facility], [place] [address] [place], [place] [address]   Heparin level (unfractionated)     Status: None   Collection Time: 09/20/20  1:07 PM  Result Value Ref Range   Heparin Unfractionated 0.38 0.30 - 0.70 IU/mL    Comment: (NOTE) The clinical reportable range upper limit is being lowered to >1.10 to align with the FDA approved guidance for the current laboratory assay.  If heparin results are below expected values, and patient dosage has  been confirmed, suggest follow up testing of antithrombin III levels. Performed  at [facility], [place] [address] [place], [place] [address]     CARDIAC CATHETERIZATION  Result Date: 09/18/2020 Formatting of this result is different from the original.   Prox LAD to Mid LAD lesion is 85% stenosed.   Mid LAD lesion is 50% stenosed.   Dist LAD lesion is 20% stenosed.   Prox Cx to Mid Cx lesion is 65% stenosed.   Prox RCA lesion is 95% stenosed.   Mid RCA lesion is 90% stenosed.   LV end diastolic pressure is mildly elevated.   Hemodynamic findings consistent with mild pulmonary hypertension. There is severe coronary calcification involving the LAD and right coronary artery with mild calcification of the left circumflex vessel.  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 Mild right heart pressure elevation with mild pulmonary hypertension. RECOMMENDATION: The patient has severe coronary calcification and three-vessel disease.  Echo Doppler has demonstrated reduced EF in the 25 to 30% range.  Recommend initial surgical consultation for consideration of possible CABG revascularization surgery.  However with the patient's significant comorbidities, if surgery is excluded, will need to review with colleagues regarding potential orbital atherectomy and intervention into the LAD and RCA.  We will consider reinstitution of low-dose heparin much later today and this patient with recent PAF and ulcerated plaque in the proximal RCA.    Review of Systems  Constitutional:  Positive for fatigue. Negative for activity change.  HENT: Negative.    Eyes: Negative.   Respiratory:  Positive for cough and shortness of breath.   Cardiovascular:   Negative for chest pain and leg swelling.  Gastrointestinal:  Negative for abdominal distention, abdominal pain and blood in stool.  Genitourinary: Negative.   Musculoskeletal:  Positive for arthralgias.  Skin: Negative.   Neurological:  Negative for dizziness and syncope.  Hematological:  Bruises/bleeds easily.  Psychiatric/Behavioral: Negative.    Blood pressure 106/62, pulse 77, temperature 98.3 F (36.8 C), temperature source Oral, resp. rate 16, height 6' (1.829 m), weight 72 kg, SpO2 94 %. Physical Exam Constitutional:      Comments: Elderly, frail appearing gentleman in no distress  HENT:     Head: Normocephalic and atraumatic.  Eyes:     Extraocular Movements: Extraocular movements intact.     Pupils: Pupils are equal, round, and reactive to light.  Neck:     Vascular: No carotid bruit.  Cardiovascular:     Rate and Rhythm: Normal rate and regular rhythm.     Pulses: Normal pulses.     Heart sounds: Murmur heard.     Comments: 2/6 systolic murmur along the right sternal border Pulmonary:     Effort: Pulmonary effort is normal.     Comments: Decreased breath sounds in the lung bases Abdominal:     General: Abdomen is flat.     Palpations: Abdomen is soft.     Tenderness: There is no abdominal tenderness.  Musculoskeletal:        General: No swelling.  Skin:    General: Skin is warm and dry.  Neurological:     General: No focal deficit present.     Mental Status: He is alert and oriented to person, place, and time.  Psychiatric:        Mood and Affect: Mood normal.        Behavior: Behavior normal.   Physicians  Panel Physicians Referring Physician Case Authorizing Physician  Troy Sine, MD (Primary)      Procedures  RIGHT AND LEFT HEART CATH    Conclusion      Prox LAD to Mid LAD lesion is 85% stenosed.   Mid LAD lesion is 50% stenosed.   Dist LAD lesion is 20% stenosed.   Prox Cx to Mid Cx lesion is 65% stenosed.   Prox RCA lesion is 95%  stenosed.   Mid RCA lesion is 90% stenosed.   LV end diastolic pressure is mildly elevated.  Hemodynamic findings consistent with mild pulmonary hypertension.   There is severe coronary calcification involving the LAD and right coronary artery with mild calcification of the left circumflex vessel.   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   Mild right heart pressure elevation with mild pulmonary hypertension.   RECOMMENDATION: The patient has severe coronary calcification and three-vessel disease.  Echo Doppler has demonstrated reduced EF in the 25 to 30% range.  Recommend initial surgical consultation for consideration of possible CABG revascularization surgery.  However with the patient's significant comorbidities, if surgery is excluded, will need to review with colleagues regarding potential orbital atherectomy and intervention into the LAD and RCA.  We will consider reinstitution of low-dose heparin much later today and this patient with recent PAF and ulcerated plaque in the proximal RCA.    Recommendations  Antiplatelet/Anticoag Recommend Aspirin 81mg  daily for moderate CAD.     Indications  Non-ST elevation (NSTEMI) myocardial infarction ([place]) [I21.4 (ICD-10-CM)]    Procedural Details  Technical Details Mr. Brennyn Haisley is a 80 year old male with a history of end-stage renal disease on dialysis, hypertension, cirrhosis, hypothyroidism, and is status post endovascular repair of an abdominal aortic aneurysm.  He had developed right-sided chest pain and had ECG changes showing prior QS complex but with slight additional J-point  elevation precordially and atrial fibrillation.  An echo Doppler study has shown an EF of 25 to 30%.  He is referred for right and left heart cardiac catheterization.  Patient arrived to the catheterization laboratory in the fasting state.  The procedure was done through the left femoral access.  Ultrasound guidance was utilized.  His left femoral artery was punctured anteriorly 5 French sheath was inserted.  Initial attempts at left femoral vein access were unsuccessful.  Diagnostic catheterization was done utilizing a [place] JL 5 and JR4 catheters.  The JR4 catheter was used for left ventricular pressure recording.  Following left heart catheterization, significant additional attempt was made at accessing the left femoral vein which ultimately was successful.  The vein was directly behind the artery.  A 7 French sheath was inserted.  A Swan-Ganz catheter was advanced to the RA, RV, and there was significant difficulty but ultimately was able to be passed to the left pulmonary artery with wire support.  Numerous attempts were made at trying to obtain a good wedge pressure recording, but this was unsuccessful.  Oxygen saturation was obtained in the pulmonary artery x2 as well as in the femoral artery.  All catheters were removed from the patient.  A Mynx closure device was used for the left femoral artery.  Hemostasis was applied to the left femoral vein after sheath removal. The patient tolerated the procedure well and returned to his room in satisfactory condition.                                      Estimated blood loss <50 mL.   During this procedure medications were administered to achieve and maintain moderate conscious sedation while the patient's heart rate, blood pressure, and oxygen saturation were continuously monitored and I was present face-to-face 100%  of this time.    Medications (Filter: Administrations occurring from 1523 to 1734 on 09/18/20)  important  Continuous medications are totaled  by the amount administered until 09/18/20 1734.    Heparin (Porcine) in NaCl 1000-0.9 UT/500ML-% SOLN (mL) Total volume:  1,000 mL  Date/Time Rate/Dose/Volume Action   09/18/20 1539 500 mL Given   1540 500 mL Given    fentaNYL (SUBLIMAZE) injection (mcg) Total dose:  25 mcg  Date/Time Rate/Dose/Volume Action   09/18/20 1546 25 mcg Given    midazolam (VERSED) injection (mg) Total dose:  2 mg  Date/Time Rate/Dose/Volume Action   09/18/20 1546 1 mg Given   1558 1 mg Given    lidocaine (PF) (XYLOCAINE) 1 % injection (mL) Total volume:  35 mL  Date/Time Rate/Dose/Volume Action   09/18/20 1556 15 mL Given   1637 10 mL Given   1641 10 mL Given    iohexol (OMNIPAQUE) 350 MG/ML injection (mL) Total volume:  70 mL  Date/Time Rate/Dose/Volume Action   09/18/20 1720 70 mL Given    0.9 %  sodium chloride infusion (mL) Total dose:  Cannot be calculated* Dosing weight:  77  *Administration dose not documented Date/Time Rate/Dose/Volume Action   09/18/20 1523 *Not included in total MAR Hold    0.9 %  sodium chloride infusion (mL) Total dose:  Cannot be calculated* Dosing weight:  77  *Administration dose not documented Date/Time Rate/Dose/Volume Action   09/18/20 1523 *Not included in total MAR Hold    0.9 %  sodium chloride infusion (mL) Total dose:  Cannot be calculated* Dosing weight:  77  *Administration dose not documented Date/Time Rate/Dose/Volume Action   09/18/20 1523 *Not included in total MAR Hold    alteplase (CATHFLO ACTIVASE) injection 2 mg (mg) Total dose:  Cannot be calculated* Dosing weight:  77  *Administration dose not documented Date/Time Rate/Dose/Volume Action   09/18/20 1523 *Not included in total MAR Hold    ampicillin-sulbactam (UNASYN) 1.5 g in sodium chloride 0.9 % 100 mL IVPB (mL/hr) Total dose:  Cannot be calculated* Dosing weight:  77  *Administration dose not documented Date/Time Rate/Dose/Volume Action   09/18/20 1523 *Not included  in total MAR Hold   1531 91.58  [vol]    1600 *Not included in total Automatically Held    fentaNYL (SUBLIMAZE) injection 12.5-25 mcg (mcg) Total dose:  Cannot be calculated* Dosing weight:  77  *Administration dose not documented Date/Time Rate/Dose/Volume Action   09/18/20 1523 *Not included in total MAR Hold    heparin injection 1,000 Units (Units) Total dose:  Cannot be calculated* Dosing weight:  77  *Administration dose not documented Date/Time Rate/Dose/Volume Action   09/18/20 1523 *Not included in total MAR Hold    lidocaine (LIDODERM) 5 % 1 patch (patch) Total dose:  Cannot be calculated* Dosing weight:  77  *Administration dose not documented Date/Time Rate/Dose/Volume Action   09/18/20 1523 *Not included in total MAR Hold    lidocaine (PF) (XYLOCAINE) 1 % injection 5 mL (mL) Total dose:  Cannot be calculated* Dosing weight:  77  *Administration dose not documented Date/Time Rate/Dose/Volume Action   09/18/20 1523 *Not included in total MAR Hold    lidocaine-prilocaine (EMLA) cream 1 application (application) Total dose:  Cannot be calculated* Dosing weight:  77  *Administration dose not documented Date/Time Rate/Dose/Volume Action   09/18/20 1523 *Not included in total MAR Hold    metoprolol succinate (TOPROL-XL) 24 hr tablet 25 mg (mg) Total dose:  Cannot be calculated* Dosing weight:  76.9  *Administration dose not documented Date/Time Rate/Dose/Volume Action   09/18/20 1523 *Not included in total MAR Hold    pentafluoroprop-tetrafluoroeth (GEBAUERS) aerosol 1 application (application) Total dose:  Cannot be calculated* Dosing weight:  77  *Administration dose not documented Date/Time Rate/Dose/Volume Action   09/18/20 1523 *Not included in total MAR Hold    sodium chloride flush (NS) 0.9 % injection 3 mL (mL) Total dose:  Cannot be calculated* Dosing weight:  76.9  *Administration dose not documented Date/Time Rate/Dose/Volume Action   09/18/20  1523 *Not included in total MAR Hold    acetaminophen (TYLENOL) tablet 650 mg (mg) Total dose:  Cannot be calculated* Dosing weight:  77  *Administration dose not documented Date/Time Rate/Dose/Volume Action   09/18/20 1523 *Not included in total MAR Hold    aspirin EC tablet 81 mg (mg) Total dose:  Cannot be calculated* Dosing weight:  76.9  *Administration dose not documented Date/Time Rate/Dose/Volume Action   09/18/20 1523 *Not included in total MAR Hold    atorvastatin (LIPITOR) tablet 80 mg (mg) Total dose:  Cannot be calculated* Dosing weight:  76.9  *Administration dose not documented Date/Time Rate/Dose/Volume Action   09/18/20 1523 *Not included in total MAR Hold    Chlorhexidine Gluconate Cloth 2 % PADS 6 each (each) Total dose:  Cannot be calculated* Dosing weight:  77  *Administration dose not documented Date/Time Rate/Dose/Volume Action   09/18/20 1523 *Not included in total MAR Hold    heparin ADULT infusion 100 units/mL (25000 units/271mL) (Units/hr) Total dose:  Cannot be calculated* Dosing weight:  76.9  *Administration dose not documented Date/Time Rate/Dose/Volume Action   09/18/20 1531 114.1  [vol]     ondansetron (ZOFRAN) injection 4 mg (mg) Total dose:  Cannot be calculated* Dosing weight:  77  *Administration dose not documented Date/Time Rate/Dose/Volume Action   09/18/20 1523 *Not included in total MAR Hold     Sedation Time  Sedation Time Physician-1: 1 hour 31 minutes 40 seconds   Contrast  Medication Name Total Dose  iohexol (OMNIPAQUE) 350 MG/ML injection 70 mL    Radiation/Fluoro  Fluoro time: 16.1 (min) DAP: 38.4 (Gycm2) Cumulative Air Kerma: 349.2 (mGy)   Coronary Findings   Diagnostic Dominance: Co-dominant  Left Anterior Descending  Prox LAD to Mid LAD lesion is 85% stenosed.  Mid LAD lesion is 50% stenosed.  Dist LAD lesion is 20% stenosed.  Left Circumflex  Prox Cx to Mid Cx lesion is 65% stenosed.  Right  Coronary Artery  Prox RCA lesion is 95% stenosed.  Mid RCA lesion is 90% stenosed.   Intervention   No interventions have been documented.    Right Heart  Right Heart Pressures Hemodynamic findings consistent with mild pulmonary hypertension. Elevated LV EDP consistent with volume overload. RA: A-wave 16, V wave 17; mean 14 RV: 38/15 PA: 39/20; mean 27 PW: unable to obtain an adequate tracing  Ao: 111/57; mean 81 LV: 108/19  Pullback: LV: 105/19 AO: 104/57; mean 77  Oxygen saturation in the pulmonary artery 68% and in these aorta 95%  By the Fick method, cardiac output 7.1 L/min and cardiac index 3.6 L/min/m.         Left Heart  Left Ventricle LV end diastolic pressure is mildly elevated.    Coronary Diagrams   Diagnostic Dominance: Co-dominant    Intervention     Implants     Vascular Products   Closure Mynx Control 83f - [id number] - Implanted  Inventory item: CLOSURE MYNX CONTROL 22F Model/Cat  number: [id number]  Manufacturer: [place] identifier: [id number]  Device identifier type: GS1     GUDID Information  Request status Successful    Brand name: MYNX CONTROL Version/Model: [id number]  Company name: [employer] safety info as of 09/18/20: MR Safe  Contains dry or latex rubber: No    GMDN P.T. name: Wound hydrogel dressing, non-antimicrobial     As of 09/18/2020  Status: Implanted        Syngo Images   Show images for CARDIAC CATHETERIZATION  Images on Long Term Storage   Show images for [place], [place] to Procedure Log  Procedure Log      Hemo Data  Flowsheet Row Most Recent Value  Fick Cardiac Output 7.06 L/min  Fick Cardiac Output Index 3.55 (L/min)/BSA  RA A Wave 16 mmHg  RA V Wave 17 mmHg  RA Mean 14 mmHg  RV Systolic Pressure 38 mmHg  RV Diastolic Pressure 8 mmHg  RV EDP 15 mmHg  PA Systolic Pressure 39 mmHg  PA Diastolic Pressure 20 mmHg  PA Mean 27 mmHg  AO Systolic  Pressure 193 mmHg  AO Diastolic Pressure 57 mmHg  AO Mean 81 mmHg  LV Systolic Pressure 790 mmHg  LV Diastolic Pressure 10 mmHg  LV EDP 19 mmHg  AOp Systolic Pressure 240 mmHg  AOp Diastolic Pressure 57 mmHg  AOp Mean Pressure 77 mmHg  LVp Systolic Pressure 973 mmHg  LVp Diastolic Pressure 9 mmHg  LVp EDP Pressure 19 mmHg  QP/QS 1  TPVR Index 7.6 HRUI  TSVR Index 22.8 HRUI  TPVR/TSVR Ratio 0.33   ECHOCARDIOGRAM REPORT         Patient Name:   Ricky Harris Date of Exam: 09/16/2020  Medical Rec #:  [id number]      Height:       72.0 in  Accession #:    [id number]     Weight:       169.8 lb  Date of Birth:  01/18/1941     BSA:          1.987 m  Patient Age:    67 years       BP:           118/59 mmHg  Patient Gender: M              HR:           96 bpm.  Exam Location:  Inpatient   Procedure: 2D Echo   Indications:    acute diastolic chf     History:        Patient has prior history of Echocardiogram examinations,  most                  recent 09/24/2019. Abdominal aortic aneurysm, Chronic  kidney                  disease. Cirrhosis, Signs/Symptoms:elevated troponin; Risk                  Factors:Hypertension.     Sonographer:    Johny Chess  Referring Phys: [id number] [place]     1. Left ventricular ejection fraction, by estimation, is 25 to 30%. The  left ventricle has severely decreased function. The left ventricle  demonstrates global hypokinesis. Left ventricular diastolic parameters are  consistent with Grade I diastolic  dysfunction (impaired relaxation).   2. Right ventricular systolic function is  mildly reduced. The right  ventricular size is normal. There is normal pulmonary artery systolic  pressure. The estimated right ventricular systolic pressure is 33.2 mmHg.   3. Left atrial size was moderately dilated.   4. Right atrial size was mildly dilated.   5. The mitral valve is normal in structure. Trivial mitral valve   regurgitation. No evidence of mitral stenosis. Moderate mitral annular  calcification.   6. The aortic valve is tricuspid. Aortic valve regurgitation is not  visualized. Mild to moderate aortic valve sclerosis/calcification is  present, without any evidence of aortic stenosis.   7. Aortic dilatation noted. There is mild dilatation of the ascending  aorta, measuring 40 mm.   8. The inferior vena cava is dilated in size with >50% respiratory  variability, suggesting right atrial pressure of 8 mmHg.   FINDINGS   Left Ventricle: Left ventricular ejection fraction, by estimation, is 25  to 30%. The left ventricle has severely decreased function. The left  ventricle demonstrates global hypokinesis. The left ventricular internal  cavity size was normal in size. There  is no left ventricular hypertrophy. Left ventricular diastolic parameters  are consistent with Grade I diastolic dysfunction (impaired relaxation).   Right Ventricle: The right ventricular size is normal. No increase in  right ventricular wall thickness. Right ventricular systolic function is  mildly reduced. There is normal pulmonary artery systolic pressure. The  tricuspid regurgitant velocity is 1.61  m/s, and with an assumed right atrial pressure of 8 mmHg, the estimated  right ventricular systolic pressure is 95.1 mmHg.   Left Atrium: Left atrial size was moderately dilated.   Right Atrium: Right atrial size was mildly dilated.   Pericardium: There is no evidence of pericardial effusion.   Mitral Valve: The mitral valve is normal in structure. Moderate mitral  annular calcification. Trivial mitral valve regurgitation. No evidence of  mitral valve stenosis.   Tricuspid Valve: The tricuspid valve is normal in structure. Tricuspid  valve regurgitation is trivial.   Aortic Valve: The aortic valve is tricuspid. Aortic valve regurgitation is  not visualized. Mild to moderate aortic valve sclerosis/calcification is   present, without any evidence of aortic stenosis.   Pulmonic Valve: The pulmonic valve was normal in structure. Pulmonic valve  regurgitation is not visualized.   Aorta: The aortic root is normal in size and structure and aortic  dilatation noted. There is mild dilatation of the ascending aorta,  measuring 40 mm.   Venous: The inferior vena cava is dilated in size with greater than 50%  respiratory variability, suggesting right atrial pressure of 8 mmHg.   IAS/Shunts: No atrial level shunt detected by color flow Doppler.      LEFT VENTRICLE  PLAX 2D  LVIDd:         5.60 cm      Diastology  LVIDs:         4.40 cm      LV e' medial:    5.00 cm/s  LV PW:         1.00 cm      LV E/e' medial:  19.5  LV IVS:        0.90 cm      LV e' lateral:   10.10 cm/s  LVOT diam:     2.20 cm      LV E/e' lateral: 9.7  LV SV:         92  LV SV Index:   46  LVOT Area:  3.80 cm     LV Volumes (MOD)  LV vol d, MOD A2C: 154.0 ml  LV vol d, MOD A4C: 199.0 ml  LV vol s, MOD A2C: 107.0 ml  LV vol s, MOD A4C: 137.0 ml  LV SV MOD A2C:     47.0 ml  LV SV MOD A4C:     199.0 ml  LV SV MOD BP:      60.9 ml   RIGHT VENTRICLE             IVC  RV S prime:     13.30 cm/s  IVC diam: 2.40 cm  TAPSE (M-mode): 2.4 cm   LEFT ATRIUM              Index       RIGHT ATRIUM           Index  LA diam:        5.10 cm  2.57 cm/m  RA Area:     21.90 cm  LA Vol (A2C):   101.0 ml 50.83 ml/m RA Volume:   66.60 ml  33.52 ml/m  LA Vol (A4C):   93.2 ml  46.90 ml/m  LA Biplane Vol: 97.0 ml  48.81 ml/m   AORTIC VALVE  LVOT Vmax:   125.00 cm/s  LVOT Vmean:  85.200 cm/s  LVOT VTI:    0.241 m     AORTA  Ao Root diam: 3.50 cm  Ao Asc diam:  4.00 cm   MITRAL VALVE                TRICUSPID VALVE  MV Area (PHT): 4.96 cm     TR Peak grad:   10.4 mmHg  MV Decel Time: 153 msec     TR Vmax:        161.00 cm/s  MV E velocity: 97.50 cm/s  MV A velocity: 112.00 cm/s  SHUNTS  MV E/A ratio:  0.87         Systemic VTI:   0.24 m                              Systemic Diam: 2.20 cm   Loralie Champagne MD  Electronically signed by Loralie Champagne MD  Signature Date/Time: 09/16/2020/5:29:59 PM         Final     Assessment/Plan:  This 80 year old gentleman has multivessel coronary disease with high-grade LAD and right coronary stenoses with diffusely calcified coronary arteries.  He has severe LV systolic dysfunction with ejection fraction of 25 to 30% and mild RV dysfunction.  He presented with some right-sided chest pain, shortness of breath, and cough likely related to pleural effusion from congestive heart failure with a BNP of greater than 4500.  He denies any anginal symptoms at home but has recently had exertional shortness of breath and fatigue that may be also related to his congestive heart failure.  I do not think he is a candidate for coronary bypass graft surgery due to his severe comorbidities including advanced age, end-stage renal disease on hemodialysis, cirrhosis with portal hypertension and recurrent problems with ascites and esophageal varices requiring multiple paracentesis procedures and esophageal banding, malnutrition with low albumin of 2.8, and severe LV systolic dysfunction.  I think his operative mortality would be prohibitive.  I would consider whether high risk atherectomy and PCI is an option or treat him medically.  I discussed all this with him and his family  and answered all of their questions.  Gaye Pollack 09/20/2020, 2:33 PM

## 2020-09-20 NOTE — Progress Notes (Signed)
     See yesterday's rounding note. New diagnosis afib with RVR self converted this admission, remains in SR on toprol and hep gtt. 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 Medical therapy limited by soft bp's currentl just on toprol, fluid management per renal and HD. Will add aldactone 12.5mg  daily. Once revasc plan is in place will need to make final decisions regarding antiplatelet/anticoag in setting of afib and CAD, patiet with cirrhosis and esophageal varices      No additional cardiology recs today. Will touch base with CT surgery to see if on there rounding list for the day.     Merrily Pew, MD  09/20/2020, 9:25 AM

## 2020-09-21 ENCOUNTER — Encounter (HOSPITAL_COMMUNITY): Payer: Self-pay | Admitting: Cardiovascular Disease

## 2020-09-21 DIAGNOSIS — I714 Abdominal aortic aneurysm, without rupture: Secondary | ICD-10-CM

## 2020-09-21 DIAGNOSIS — I10 Essential (primary) hypertension: Secondary | ICD-10-CM

## 2020-09-21 DIAGNOSIS — R188 Other ascites: Secondary | ICD-10-CM

## 2020-09-21 DIAGNOSIS — K7460 Unspecified cirrhosis of liver: Secondary | ICD-10-CM

## 2020-09-21 LAB — RENAL FUNCTION PANEL
Albumin: 2.7 g/dL — ABNORMAL LOW (ref 3.5–5.0)
Anion gap: 11 (ref 5–15)
BUN: 21 mg/dL (ref 8–23)
CO2: 25 mmol/L (ref 22–32)
Calcium: 7.8 mg/dL — ABNORMAL LOW (ref 8.9–10.3)
Chloride: 96 mmol/L — ABNORMAL LOW (ref 98–111)
Creatinine, Ser: 5.45 mg/dL — ABNORMAL HIGH (ref 0.61–1.24)
GFR, Estimated: 10 mL/min — ABNORMAL LOW (ref >60)
Glucose, Bld: 104 mg/dL — ABNORMAL HIGH (ref 70–99)
Phosphorus: 3.3 mg/dL (ref 2.5–4.6)
Potassium: 3.5 mmol/L (ref 3.5–5.1)
Sodium: 132 mmol/L — ABNORMAL LOW (ref 135–145)

## 2020-09-21 LAB — HEPARIN LEVEL (UNFRACTIONATED): Heparin Unfractionated: 0.37 IU/mL (ref 0.30–0.70)

## 2020-09-21 LAB — CBC
HCT: 30.3 % — ABNORMAL LOW (ref 39.0–52.0)
Hemoglobin: 9.7 g/dL — ABNORMAL LOW (ref 13.0–17.0)
MCH: 33 pg (ref 26.0–34.0)
MCHC: 32 g/dL (ref 30.0–36.0)
MCV: 103.1 fL — ABNORMAL HIGH (ref 80.0–100.0)
Platelets: 123 10*3/uL — ABNORMAL LOW (ref 150–400)
RBC: 2.94 MIL/uL — ABNORMAL LOW (ref 4.22–5.81)
RDW: 15.8 % — ABNORMAL HIGH (ref 11.5–15.5)
WBC: 5.9 10*3/uL (ref 4.0–10.5)
nRBC: 0 % (ref 0.0–0.2)

## 2020-09-21 LAB — MAGNESIUM: Magnesium: 1.9 mg/dL (ref 1.7–2.4)

## 2020-09-21 MED ORDER — DARBEPOETIN ALFA 40 MCG/0.4ML IJ SOSY
40.0000 ug | PREFILLED_SYRINGE | INTRAMUSCULAR | Status: DC
  Filled 2020-09-21: qty 0.4

## 2020-09-21 NOTE — Progress Notes (Signed)
PT Cancellation Note  Patient Details Name: Ricky Harris MRN: [id number] DOB: May 04, 1940   Cancelled Treatment:    Reason Eval/Treat Not Completed: Fatigue/lethargy limiting ability to participate. Patient declining ambulation and/or exercises at this time. Will re-attempt at later date.     Laylonie Marzec 09/21/2020, 2:13 PM

## 2020-09-21 NOTE — Progress Notes (Signed)
[place] for heparin Indication:  Afib and r/o ACS  Patient Measurements: Height: 6' (182.9 cm) Weight: 77.1 kg (169 lb 15.6 oz) IBW/kg (Calculated) : 77.6 Heparin Dosing Wt: 72 kg  Vital Signs: Temp: 98.3 F (36.8 C) (07/25 0300) Temp Source: Oral (07/25 0300) BP: 110/63 (07/25 0300) Pulse Rate: 76 (07/25 0300)  Labs: Recent Labs    09/19/20 0358 09/19/20 1140 09/20/20 0505 09/20/20 1307 09/21/20 0254  HGB 10.4*  --  9.5*  --  9.7*  HCT 32.1*  --  29.6*  --  30.3*  PLT 120*  --  111*  --  123*  HEPARINUNFRC  --    < > 0.39 0.38 0.37  CREATININE 5.34*  --  4.06*  --  5.45*   < > = values in this interval not displayed.     Estimated Creatinine Clearance: 12.2 mL/min (A) (by C-G formula based on SCr of 5.45 mg/dL (H)).   Medications:  Medications Prior to Admission  Medication Sig Dispense Refill Last Dose   amLODipine (NORVASC) 5 MG tablet Take 5 mg by mouth daily.   09/15/2020   atorvastatin (LIPITOR) 10 MG tablet Take 10 mg by mouth daily.   09/15/2020   carvedilol (COREG) 12.5 MG tablet Take 12.5 mg by mouth 2 (two) times daily.   09/15/2020 at 0900   febuxostat (ULORIC) 40 MG tablet Take 1 tablet (40 mg total) by mouth daily. 30 tablet 3 Past Month   lactulose (CHRONULAC) 10 GM/15ML solution Take 15 mLs (10 g total) by mouth daily. 236 mL 1 Past Month   multivitamin (RENA-VIT) TABS tablet Take 1 tablet by mouth daily. 30 tablet 0 09/15/2020   calcitRIOL (ROCALTROL) 0.25 MCG capsule Take 1 capsule (0.25 mcg total) by mouth every Tuesday, Thursday, and Saturday at 6 PM. (Patient not taking: No sig reported) 30 capsule 3 Not Taking   calcium acetate (PHOSLO) 667 MG capsule Take 1 capsule (667 mg total) by mouth 3 (three) times daily with meals. 90 capsule 2    cloNIDine (CATAPRES) 0.1 MG tablet Take 1 tablet (0.1 mg total) by mouth daily. (Patient not taking: No sig reported) 30 tablet 0 Not Taking   pantoprazole (PROTONIX) 40 MG  tablet Take 1 tablet (40 mg total) by mouth daily. 30 tablet 1    traMADol (ULTRAM) 50 MG tablet Take 1 tablet (50 mg total) by mouth every 8 (eight) hours as needed for moderate pain. (Patient not taking: Reported on 09/17/2020) 8 tablet 0 Not Taking   Scheduled:   aspirin EC  81 mg Oral Daily   atorvastatin  80 mg Oral QHS   isosorbide mononitrate  15 mg Oral Daily   lidocaine  1 patch Transdermal Q24H   metoprolol succinate  25 mg Oral Daily   sodium chloride flush  3 mL Intravenous Q12H   sodium chloride flush  3 mL Intravenous Q12H   spironolactone  12.5 mg Oral Daily   Infusions:   sodium chloride     sodium chloride     sodium chloride     sodium chloride     ampicillin-sulbactam (UNASYN) IV 1.5 g (09/20/20 1535)   heparin 1,500 Units/hr (09/20/20 1758)    Assessment: 80yo male admitted for acute on chronic CHF w/new onset Afib. LHC with mvCAD, revascularization plan pending. Heparin level therapeutic and CBC stable.  Goal of Therapy:  Heparin level 0.3-0.7 units/ml Monitor platelets by anticoagulation protocol: Yes   Plan:  Continue IV heparin  gtt at 1500 units/hr Daily heparin level, CBC  Arrie Senate, PharmD, [place], [facility] Clinical Pharmacist [phone] Please check AMION for all [place] numbers 09/21/2020

## 2020-09-21 NOTE — Progress Notes (Addendum)
The patient has been seen in conjunction with Billey Chang, PAC. All aspects of care have been considered and discussed. The patient has been personally interviewed, examined, and all clinical data has been reviewed.  Significant risk for bleeding with antithrombotic and or dual antiplatelet therapy. In absence of angina and after reviewing most recent coronary angiography where there is moderately severe LAD and circumflex disease and critical right coronary disease, I recommend medical therapy. The right coronary fills slowly and has faint left to right collaterals.  If refractory angina becomes an issue, interventional therapy is not possible on right coronary in a safe manner.  I could potentially do orbital atherectomy followed by stenting of the proximal to mid LAD.  High risk for bleeding complications on dual antiplatelet therapy.  We would prefer a conservative treatment strategy EF is reduced based upon echo from 09/16/2020.  There is likely an ischemic component.  He has been turned down for coronary bypass surgery. Amiodarone therapy for control of atrial fibrillation.  There is risk involved given that the patient has a history of cirrhosis.  This was discussed with the EP.  He has not felt to be a reasonable candidate for any antiarrhythmic therapy.   Given above concerns, I recommend continuing medical therapy, start Eliquis, and follow closely for evidence of bleeding. Overall prognosis is very poor.  Recommend palliative care consult for goals of management and establishing CODE STATUS.  I discussed this with the patient and his brother this morning.  Patient gave me know signals 1 way or the other with how he felt about his prognosis and future management.  Progress Note  Patient Name: Ricky Harris Date of Encounter: 09/21/2020  [facility] Cardiologist: Dr. Martinique  Subjective   Denies any CP or SOB.   Inpatient Medications    Scheduled Meds:  aspirin EC  81 mg Oral  Daily   atorvastatin  80 mg Oral QHS   isosorbide mononitrate  15 mg Oral Daily   lidocaine  1 patch Transdermal Q24H   metoprolol succinate  25 mg Oral Daily   sodium chloride flush  3 mL Intravenous Q12H   sodium chloride flush  3 mL Intravenous Q12H   spironolactone  12.5 mg Oral Daily   Continuous Infusions:  sodium chloride     sodium chloride     sodium chloride     sodium chloride     ampicillin-sulbactam (UNASYN) IV 1.5 g (09/20/20 1535)   heparin 1,500 Units/hr (09/20/20 1758)   PRN Meds: sodium chloride, sodium chloride, sodium chloride, sodium chloride, acetaminophen, alteplase, diazepam, fentaNYL (SUBLIMAZE) injection, heparin, lidocaine (PF), lidocaine-prilocaine, ondansetron (ZOFRAN) IV, pentafluoroprop-tetrafluoroeth, sodium chloride flush   Vital Signs    Vitals:   09/20/20 1422 09/21/20 0300 09/21/20 0806 09/21/20 0812  BP: 106/62 110/63 115/63 115/63  Pulse: 77 76 76 77  Resp: 16  16   Temp: 98.3 F (36.8 C) 98.3 F (36.8 C) 97.6 F (36.4 C)   TempSrc: Oral Oral Oral   SpO2: 94% 97% 99%   Weight:  77.1 kg    Height:        Intake/Output Summary (Last 24 hours) at 09/21/2020 0930 Last data filed at 09/21/2020 0859 Gross per 24 hour  Intake 460 ml  Output --  Net 460 ml   Last 3 Weights 09/21/2020 09/20/2020 09/19/2020  Weight (lbs) 169 lb 15.6 oz 158 lb 11.7 oz 159 lb 6.3 oz  Weight (kg) 77.1 kg 72 kg 72.3 kg  Telemetry    NSR without recurrence of afib - Personally Reviewed  ECG    Sinus rhythm with poor R wave progression in the anterior leads - Personally Reviewed  Physical Exam   GEN: pale, cachectic  Neck: No JVD Cardiac: RRR, no murmurs, rubs, or gallops.  Respiratory: Clear to auscultation bilaterally. GI: Soft, nontender, non-distended  MS: No edema; No deformity. Neuro:  Nonfocal  Psych: Normal affect   Labs    High Sensitivity Troponin:   Recent Labs  Lab 09/15/20 1526 09/15/20 1806 09/18/20 0216 09/18/20 0458   TROPONINIHS 96* 87* 103* 110*      Chemistry Recent Labs  Lab 09/15/20 1526 09/15/20 2025 09/16/20 0335 09/19/20 0358 09/20/20 0505 09/21/20 0254  NA 140 137   < > 134* 135 132*  K 3.2* 3.4*   < > 3.9 3.5 3.5  CL 101 99   < > 99 98 96*  CO2 27 24   < > 23 26 25   GLUCOSE 82 90   < > 91 105* 104*  BUN 29* 31*   < > 23 13 21   CREATININE 7.10* 7.61*   < > 5.34* 4.06* 5.45*  CALCIUM 8.3* 8.0*   < > 8.0* 8.5* 7.8*  PROT 6.4*  --   --   --   --   --   ALBUMIN 3.4* 2.8*  --   --   --  2.7*  AST 25  --   --   --   --   --   ALT 14  --   --   --   --   --   ALKPHOS 108  --   --   --   --   --   BILITOT 1.0  --   --   --   --   --   GFRNONAA 7* 7*   < > 10* 14* 10*  ANIONGAP 12 14   < > 12 11 11    < > = values in this interval not displayed.     Hematology Recent Labs  Lab 09/19/20 0358 09/20/20 0505 09/21/20 0254  WBC 6.1 5.3 5.9  RBC 3.07* 2.88* 2.94*  HGB 10.4* 9.5* 9.7*  HCT 32.1* 29.6* 30.3*  MCV 104.6* 102.8* 103.1*  MCH 33.9 33.0 33.0  MCHC 32.4 32.1 32.0  RDW 16.2* 15.7* 15.8*  PLT 120* 111* 123*    BNP Recent Labs  Lab 09/15/20 1618  BNP >4,500.0*     DDimer No results for input(s): DDIMER in the last 168 hours.   Radiology    No results found.  Cardiac Studies   Echo 09/16/2020  1. Left ventricular ejection fraction, by estimation, is 25 to 30%. The  left ventricle has severely decreased function. The left ventricle  demonstrates global hypokinesis. Left ventricular diastolic parameters are  consistent with Grade I diastolic  dysfunction (impaired relaxation).   2. Right ventricular systolic function is mildly reduced. The right  ventricular size is normal. There is normal pulmonary artery systolic  pressure. The estimated right ventricular systolic pressure is 02.7 mmHg.   3. Left atrial size was moderately dilated.   4. Right atrial size was mildly dilated.   5. The mitral valve is normal in structure. Trivial mitral valve  regurgitation.  No evidence of mitral stenosis. Moderate mitral annular  calcification.   6. The aortic valve is tricuspid. Aortic valve regurgitation is not  visualized. Mild to moderate aortic valve sclerosis/calcification is  present, without any  evidence of aortic stenosis.   7. Aortic dilatation noted. There is mild dilatation of the ascending  aorta, measuring 40 mm.   8. The inferior vena cava is dilated in size with >50% respiratory  variability, suggesting right atrial pressure of 8 mmHg.     Cath 09/18/2020   Prox LAD to Mid LAD lesion is 85% stenosed.   Mid LAD lesion is 50% stenosed.   Dist LAD lesion is 20% stenosed.   Prox Cx to Mid Cx lesion is 65% stenosed.   Prox RCA lesion is 95% stenosed.   Mid RCA lesion is 90% stenosed.   LV end diastolic pressure is mildly elevated.   Hemodynamic findings consistent with mild pulmonary hypertension.   There is severe coronary calcification involving the LAD and right coronary artery with mild calcification of the left circumflex vessel.   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   Mild right heart pressure elevation with mild pulmonary hypertension.   RECOMMENDATION: The patient has severe coronary calcification and three-vessel disease.  Echo Doppler has demonstrated reduced EF in the 25 to 30% range.  Recommend initial surgical consultation for consideration of possible CABG revascularization surgery.  However with the patient's significant comorbidities, if surgery is excluded, will need to review with colleagues regarding potential orbital atherectomy and intervention into the LAD and RCA.  We  will consider reinstitution of low-dose heparin much later today and this patient with recent PAF and ulcerated plaque in the proximal RCA.  Patient Profile     80 y.o. 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 H/o esophageal varices and cirrhosis.  Acute systolic CHF / ischemic cardiomyopathy  - Echo 08/2019 EF 60-65%  - Echo 08/2020 EF 25-30%, global hypokinesis  - continue metoprolol succinate, spironolactone.   CAD  - RHC/LHC: prox LAD 85%, mid LAD 50%, LCX 65%, prox RCA 95% and mid 90%, CI 3.55 mean PA 27 PCWP not reported, LVEDP 19  - seen by Dr. Cyndia Bent of CT surgery, turned down for CABG, MD to review and see if patient is a PCI candidate. Make NPO now  - complicating issue is if stenting, will need ASA and plavix. Patient may also need NOAC give new afib. High risk of bleeding with preexisting esophageal varices.  ESRD  Cirrhosis: S/p partial gastrectomy and multiple esophageal varices banding procedures. Last endoscopy on 02/14/2020 showed small <1mm esophageal varices.       For questions or updates, please contact [place] Please consult [email or web address] for contact info under        Signed, Almyra Deforest, [place]  09/21/2020, 9:30 AM

## 2020-09-21 NOTE — Progress Notes (Signed)
PROGRESS NOTE    Ricky Harris  HTD:[id number] DOB: [birth year] DOA: 09/15/2020 PCP: [name]., MD   Chief Complaint  Patient presents with   Chest Pain    Brief Narrative: 80 year old male with ESRD on HD, liver cirrhosis, HTN, hypothyroidism, AAA with stent graft, CAD seen in the CT scan.  From dialysis center for evaluation of right-sided chest pain. He was seen in the ED EKG showed sinus rhythm chest x-ray with left more than right pleural effusion, CTA chest abdomen pelvis negative for PE BNP more than 4500, showed new moderate pleural effusion nephro was consulted cardio was consulted and patient was admitted.  Subjective: Alert,awake, on chest pain nausea or shortness of breath Son Bryan at bedside On RA. No BM.  Assessment & Plan:  Acute on chronic combined systolic and diastolic CHF: Bilateral pleural effusions Appreciate cardiology input EF decreased to 25-30% with three-vessel disease CT surgery evaluating for atherectomy versus CABG.  Continue aspirin, Imdur, Lipitor and metoprolol Appreciate cardiology input. Weight iS UP but has total net negative balance  Net IO Since Admission: -3,412.54 mL [09/21/20 1436]  Filed Weights   09/19/20 1600 09/20/20 0545 09/21/20 0300  Weight: 72.3 kg 72 kg 77.1 kg     Ischemic cardiomyopathy Slightly positive troponin Monitor vessel coronary artery disease seen in the cardiac cath-with high-grade LAD and right coronary stenosis EF low underwent cath and plan for cardiovascular evaluation as above  New onset A. fib after dialysis on 7/21.He spontaneously converted.  On Toprol, on anticoagulation with heparin  Dyslipidemia on statin  ESRD on HD TTS, nephro on board continue as per nephrology currently stable Next dialysis 7/26. Metabolic bone disease calcium/phosphorus stable continue home binders/VDRF  Liver cirrhosis:Compensated  History of AAA underwent CTA with patent aortic stent graft with 6.1 cm native sac  diameter up from 5.9 need outpatient close follow-up  Possible Community-acquired pneumonia chest x-ray suspicious for bilateral left greater than right airspace disease on Unasyn.  Overall afebrile, no leukocytosis procalcitonin was 0.6 Recent Labs  Lab 09/16/20 0335 09/17/20 0319 09/18/20 0107 09/19/20 0358 09/20/20 0505 09/21/20 0254  WBC 7.3 6.9 5.0 6.1 5.3 5.9  PROCALCITON 0.60  --   --   --   --   --    Thrombocytopenia monitor closely.  Improving. Recent Labs  Lab 09/17/20 0319 09/18/20 0107 09/19/20 0358 09/20/20 0505 09/21/20 0254  PLT 88* 87* 120* 111* 123*   Anemia of chronic disease hemoglobin stable.  Monitor Recent Labs  Lab 09/18/20 1706 09/18/20 1710 09/19/20 0358 09/20/20 0505 09/21/20 0254  HGB 9.9*  10.2* 9.9* 10.4* 9.5* 9.7*  HCT 29.0*  30.0* 29.0* 32.1* 29.6* 30.3*    Moderate protein calorie malnutrition augment diet as tolerated.  Diet Order             Diet renal/carb modified with fluid restriction Diet-HS Snack? Nothing; Fluid restriction: 1200 mL Fluid; Room service appropriate? Yes; Fluid consistency: Thin  Diet effective now                 Patient's Body mass index is 23.05 kg/m. DVT prophylaxis: SCD's Start: 09/18/20 1801 Code Status:   Code Status: Full Code  Family Communication: plan of care discussed with patient and son at bedside.  Status is: Inpatient Remains inpatient appropriate because:IV treatments appropriate due to intensity of illness or inability to take PO and Inpatient level of care appropriate due to severity of illness Dispo:  Patient From: Home  Planned Disposition: Home  Medically stable for discharge: No  Unresulted Labs (From admission, onward)     Start     Ordered   09/21/20 0500  Heparin level (unfractionated)  Daily,   R     Question:  Specimen collection method  Answer:  Lab=Lab collect  See Hyperspace for full Linked Orders Report.   09/19/20 2227   09/20/20 0500  CBC  Daily,   R      Question:  Specimen collection method  Answer:  Lab=Lab collect  See Hyperspace for full Linked Orders Report.   09/19/20 2227   09/16/20 0749  Expectorated Sputum Assessment w Gram Stain, Rflx to Resp Cult  Once,   R        09/16/20 0748   09/16/20 0749  Strep pneumoniae urinary antigen  Once,   STAT        09/16/20 0748   09/16/20 0749  Legionella Pneumophila Serogp 1 Ur Ag  Once,   STAT        09/16/20 0748           Medications reviewed:  Scheduled Meds:  aspirin EC  81 mg Oral Daily   atorvastatin  80 mg Oral QHS   isosorbide mononitrate  15 mg Oral Daily   lidocaine  1 patch Transdermal Q24H   metoprolol succinate  25 mg Oral Daily   sodium chloride flush  3 mL Intravenous Q12H   sodium chloride flush  3 mL Intravenous Q12H   spironolactone  12.5 mg Oral Daily   Continuous Infusions:  sodium chloride     sodium chloride     sodium chloride     sodium chloride     ampicillin-sulbactam (UNASYN) IV 1.5 g (09/20/20 1535)   heparin 1,500 Units/hr (09/20/20 1758)   Consultants:see note  Procedures:see note Antimicrobials: Anti-infectives (From admission, onward)    Start     Dose/Rate Route Frequency Ordered Stop   09/17/20 0000  amoxicillin-clavulanate (AUGMENTIN) 500-125 MG tablet        1 tablet Oral 2 times daily 09/17/20 0945 09/23/20 2359   09/16/20 1600  ampicillin-sulbactam (UNASYN) 1.5 g in sodium chloride 0.9 % 100 mL IVPB        1.5 g 200 mL/hr over 30 Minutes Intravenous Every 24 hours 09/16/20 0931 09/21/20 1559   09/16/20 0800  cefTRIAXone (ROCEPHIN) 1 g in sodium chloride 0.9 % 100 mL IVPB  Status:  Discontinued        1 g 200 mL/hr over 30 Minutes Intravenous Every 24 hours 09/16/20 0753 09/16/20 0903   09/16/20 0800  azithromycin (ZITHROMAX) 250 mg in dextrose 5 % 125 mL IVPB  Status:  Discontinued        250 mg 125 mL/hr over 60 Minutes Intravenous Every 24 hours 09/16/20 0753 09/16/20 0903      Culture/Microbiology    Component Value  Date/Time   SDES PERITONEAL FLUID 02/19/2020 1603   SDES PERITONEAL FLUID 02/19/2020 1603   SPECREQUEST ABDOMEN 02/19/2020 1603   SPECREQUEST ABDOMEN 02/19/2020 1603   CULT  02/19/2020 1603    NO GROWTH 5 DAYS Performed at [facility], [place] [address] [place], [place] [address]    REPTSTATUS 02/19/2020 FINAL 02/19/2020 1603   REPTSTATUS 02/27/2020 FINAL 02/19/2020 1603    Other culture-see note  Objective: Vitals: Today's Vitals   09/20/20 1422 09/21/20 0300 09/21/20 0806 09/21/20 0812  BP: 106/62 110/63 115/63 115/63  Pulse: 77 76 76 77  Resp: 16  16   Temp:  98.3 F (36.8 C) 98.3 F (36.8 C) 97.6 F (36.4 C)   TempSrc: Oral Oral Oral   SpO2: 94% 97% 99%   Weight:  77.1 kg    Height:      PainSc:  0-No pain 0-No pain     Intake/Output Summary (Last 24 hours) at 09/21/2020 0901 Last data filed at 09/21/2020 0859 Gross per 24 hour  Intake 460 ml  Output --  Net 460 ml   Filed Weights   09/19/20 1600 09/20/20 0545 09/21/20 0300  Weight: 72.3 kg 72 kg 77.1 kg   Weight change: 2.5 kg  Intake/Output from previous day: 07/24 0701 - 07/25 0700 In: 240 [P.O.:240] Out: -  Intake/Output this shift: Total I/O In: 220 [P.O.:220] Out: -  Filed Weights   09/19/20 1600 09/20/20 0545 09/21/20 0300  Weight: 72.3 kg 72 kg 77.1 kg   Examination: General exam: AAOx,3 on Ra, HEENT:Oral mucosa moist, Ear/Nose WNL grossly,dentition normal. Respiratory system: bilaterally clear, no use of accessory muscle, non tender. Cardiovascular system: S1 & S2 +,no JVD. Gastrointestinal system: Abdomen soft, NT,ND, BS+. Nervous System:Alert, awake, moving extremities Extremities: no edema, distal peripheral pulses palpable.  Skin: No rashes,no icterus. Bruies o nrt arm at iv sites, and in Left groin area, dressing intact.  MSK: Normal muscle bulk,tone, power Data Reviewed: I have personally reviewed following labs and imaging studies CBC: Recent Labs  Lab 09/15/20 1526  09/15/20 2025 09/17/20 0319 09/18/20 0107 09/18/20 1706 09/18/20 1710 09/19/20 0358 09/20/20 0505 09/21/20 0254  WBC 6.7   < > 6.9 5.0  --   --  6.1 5.3 5.9  NEUTROABS 5.8  --   --   --   --   --   --   --   --   HGB 10.2*   < > 10.3* 10.2* 9.9*  10.2* 9.9* 10.4* 9.5* 9.7*  HCT 33.2*   < > 32.1* 31.8* 29.0*  30.0* 29.0* 32.1* 29.6* 30.3*  MCV 108.1*   < > 104.6* 104.3*  --   --  104.6* 102.8* 103.1*  PLT 90*   < > 88* 87*  --   --  120* 111* 123*   < > = values in this interval not displayed.   Basic Metabolic Panel: Recent Labs  Lab 09/15/20 2025 09/16/20 0335 09/17/20 0319 09/18/20 0107 09/18/20 1706 09/18/20 1710 09/19/20 0358 09/20/20 0505 09/21/20 0254  NA 137   < > 137 138 138  139 139 134* 135 132*  K 3.4*   < > 3.4* 3.3* 3.9  3.9 3.8 3.9 3.5 3.5  CL 99   < > 99 99  --   --  99 98 96*  CO2 24   < > 28 28  --   --  23 26 25   GLUCOSE 90   < > 99 141*  --   --  91 105* 104*  BUN 31*   < > 18 12  --   --  23 13 21   CREATININE 7.61*   < > 4.86* 3.54*  --   --  5.34* 4.06* 5.45*  CALCIUM 8.0*   < > 8.0* 8.2*  --   --  8.0* 8.5* 7.8*  MG  --   --   --  1.8  --   --   --   --  1.9  PHOS 4.2  --   --   --   --   --   --   --  3.3   < > = values in this interval not displayed.   GFR: Estimated Creatinine Clearance: 12.2 mL/min (A) (by C-G formula based on SCr of 5.45 mg/dL (H)). Liver Function Tests: Recent Labs  Lab 09/15/20 1526 09/15/20 2025 09/21/20 0254  AST 25  --   --   ALT 14  --   --   ALKPHOS 108  --   --   BILITOT 1.0  --   --   PROT 6.4*  --   --   ALBUMIN 3.4* 2.8* 2.7*   Recent Labs  Lab 09/15/20 1526  LIPASE 27   No results for input(s): AMMONIA in the last 168 hours. Coagulation Profile: Recent Labs  Lab 09/15/20 1526  INR 1.2   Cardiac Enzymes: No results for input(s): CKTOTAL, CKMB, CKMBINDEX, TROPONINI in the last 168 hours. BNP (last 3 results) No results for input(s): PROBNP in the last 8760 hours. HbA1C: No results for  input(s): HGBA1C in the last 72 hours. CBG: No results for input(s): GLUCAP in the last 168 hours. Lipid Profile: Recent Labs    09/19/20 0358  CHOL 133  HDL 38*  LDLCALC 74  TRIG 103  CHOLHDL 3.5   Thyroid Function Tests: No results for input(s): TSH, T4TOTAL, FREET4, T3FREE, THYROIDAB in the last 72 hours. Anemia Panel: No results for input(s): VITAMINB12, FOLATE, FERRITIN, TIBC, IRON, RETICCTPCT in the last 72 hours. Sepsis Labs: Recent Labs  Lab 09/16/20 0335  PROCALCITON 0.60    Recent Results (from the past 240 hour(s))  Resp Panel by RT-PCR (Flu A&B, Covid) Nasopharyngeal Swab     Status: None   Collection Time: 09/15/20  4:14 PM   Specimen: Nasopharyngeal Swab; Nasopharyngeal(NP) swabs in vial transport medium  Result Value Ref Range Status   SARS Coronavirus 2 by RT PCR NEGATIVE NEGATIVE Final    Comment: (NOTE) SARS-CoV-2 target nucleic acids are NOT DETECTED.  The SARS-CoV-2 RNA is generally detectable in upper respiratory specimens during the acute phase of infection. The lowest concentration of SARS-CoV-2 viral copies this assay can detect is 138 copies/mL. A negative result does not preclude SARS-Cov-2 infection and should not be used as the sole basis for treatment or other patient management decisions. A negative result may occur with  improper specimen collection/handling, submission of specimen other than nasopharyngeal swab, presence of viral mutation(s) within the areas targeted by this assay, and inadequate number of viral copies(<138 copies/mL). A negative result must be combined with clinical observations, patient history, and epidemiological information. The expected result is Negative.  Fact Sheet for Patients:  EntrepreneurPulse.com.au  Fact Sheet for Healthcare Providers:  IncredibleEmployment.be  This test is no t yet approved or cleared by the [place] FDA and  has been authorized for detection  and/or diagnosis of SARS-CoV-2 by FDA under an Emergency Use Authorization (EUA). This EUA will remain  in effect (meaning this test can be used) for the duration of the COVID-19 declaration under Section 564(b)(1) of the Act, 21 U.S.C.section 360bbb-3(b)(1), unless the authorization is terminated  or revoked sooner.       Influenza A by PCR NEGATIVE NEGATIVE Final   Influenza B by PCR NEGATIVE NEGATIVE Final    Comment: (NOTE) The Xpert Xpress SARS-CoV-2/FLU/RSV plus assay is intended as an aid in the diagnosis of influenza from Nasopharyngeal swab specimens and should not be used as a sole basis for treatment. Nasal washings and aspirates are unacceptable for Xpert Xpress SARS-CoV-2/FLU/RSV testing.  Fact Sheet for Patients: EntrepreneurPulse.com.au  Fact Sheet for Healthcare Providers: IncredibleEmployment.be  This test is not yet approved or cleared by the [place] FDA and has been authorized for detection and/or diagnosis of SARS-CoV-2 by FDA under an Emergency Use Authorization (EUA). This EUA will remain in effect (meaning this test can be used) for the duration of the COVID-19 declaration under Section 564(b)(1) of the Act, 21 U.S.C. section 360bbb-3(b)(1), unless the authorization is terminated or revoked.  Performed at [facility], [place] [address], [place], [place] [address]      Radiology Studies: No results found.   LOS: 6 days   Antonieta Pert, MD [facility]  09/21/2020, 9:01 AM

## 2020-09-21 NOTE — Progress Notes (Signed)
[facility] Progress Note   Subjective:   Seen in room. No events overnight. No cp, sob.   Objective Vitals:   09/20/20 1422 09/21/20 0300 09/21/20 0806 09/21/20 0812  BP: 106/62 110/63 115/63 115/63  Pulse: 77 76 76 77  Resp: 16  16   Temp: 98.3 F (36.8 C) 98.3 F (36.8 C) 97.6 F (36.4 C)   TempSrc: Oral Oral Oral   SpO2: 94% 97% 99%   Weight:  77.1 kg    Height:       Physical Exam General: Well appearing man, NAD. Room air. Heart: RRR; no murmur Lungs: CTAB Abdomen: soft, non-tender Extremities: No LE edema Dialysis Access: L AVF + bruit  Additional Objective Labs: Basic Metabolic Panel: Recent Labs  Lab 09/15/20 2025 09/16/20 0335 09/19/20 0358 09/20/20 0505 09/21/20 0254  NA 137   < > 134* 135 132*  K 3.4*   < > 3.9 3.5 3.5  CL 99   < > 99 98 96*  CO2 24   < > 23 26 25   GLUCOSE 90   < > 91 105* 104*  BUN 31*   < > 23 13 21   CREATININE 7.61*   < > 5.34* 4.06* 5.45*  CALCIUM 8.0*   < > 8.0* 8.5* 7.8*  PHOS 4.2  --   --   --  3.3   < > = values in this interval not displayed.    Liver Function Tests: Recent Labs  Lab 09/15/20 1526 09/15/20 2025 09/21/20 0254  AST 25  --   --   ALT 14  --   --   ALKPHOS 108  --   --   BILITOT 1.0  --   --   PROT 6.4*  --   --   ALBUMIN 3.4* 2.8* 2.7*    Recent Labs  Lab 09/15/20 1526  LIPASE 27    CBC: Recent Labs  Lab 09/15/20 1526 09/15/20 2025 09/17/20 0319 09/18/20 0107 09/18/20 1706 09/19/20 0358 09/20/20 0505 09/21/20 0254  WBC 6.7   < > 6.9 5.0  --  6.1 5.3 5.9  NEUTROABS 5.8  --   --   --   --   --   --   --   HGB 10.2*   < > 10.3* 10.2*   < > 10.4* 9.5* 9.7*  HCT 33.2*   < > 32.1* 31.8*   < > 32.1* 29.6* 30.3*  MCV 108.1*   < > 104.6* 104.3*  --  104.6* 102.8* 103.1*  PLT 90*   < > 88* 87*  --  120* 111* 123*   < > = values in this interval not displayed.   Studies/Results: No results found. Medications:  sodium chloride     sodium chloride     sodium chloride      sodium chloride     ampicillin-sulbactam (UNASYN) IV 1.5 g (09/20/20 1535)   heparin 1,500 Units/hr (09/21/20 1106)    aspirin EC  81 mg Oral Daily   atorvastatin  80 mg Oral QHS   isosorbide mononitrate  15 mg Oral Daily   lidocaine  1 patch Transdermal Q24H   metoprolol succinate  25 mg Oral Daily   sodium chloride flush  3 mL Intravenous Q12H   sodium chloride flush  3 mL Intravenous Q12H   spironolactone  12.5 mg Oral Daily    Dialysis Orders: TTS [place] 4hr, 350/500, EDW 77.5kg, 3K/2.5Ca, AVF, heparin 3000 bolus - Venofer 50mg  IV q  week - Calcitriol 0.64mcg PO q HD   Assessment/Plan:  Chest pain/B pleural effusions/pneumonia: CTA neg for PE/dissection. S/p extra HD on 7/20. On Unasyn for pneumonia coverage.   ESRD: Usual TTS schedule , s/p extra HD 7/20. Next HD 7/26  Hypertension/volume: BP low/normal, on metoprolol for now. Got below EDW 7/23. Follow.   Anemia: Hgb 9.5 - Start ESA. Aranesp 40 with HD 7/26.   Metabolic bone disease: Ca/Phos ok - continue home binders/VDRA.  Cirrhosis/thrombocytopenia  Elevated Trop: Follow trends  AAA/new ascending thoracic AA: For OP follow-up per notes.  Thrombocytopenia - improving.   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 Not a candidate for CABG. Medical therapy per cards.   New A-Fib: Per cards. Remains on heparin gtt   Lynnda Child PA-C [facility] 09/21/2020,11:38 AM

## 2020-09-22 DIAGNOSIS — R778 Other specified abnormalities of plasma proteins: Secondary | ICD-10-CM

## 2020-09-22 DIAGNOSIS — Z7189 Other specified counseling: Secondary | ICD-10-CM

## 2020-09-22 DIAGNOSIS — D696 Thrombocytopenia, unspecified: Secondary | ICD-10-CM

## 2020-09-22 DIAGNOSIS — K7469 Other cirrhosis of liver: Secondary | ICD-10-CM

## 2020-09-22 DIAGNOSIS — Z515 Encounter for palliative care: Secondary | ICD-10-CM

## 2020-09-22 DIAGNOSIS — N186 End stage renal disease: Secondary | ICD-10-CM

## 2020-09-22 LAB — CBC
HCT: 30.5 % — ABNORMAL LOW (ref 39.0–52.0)
Hemoglobin: 9.6 g/dL — ABNORMAL LOW (ref 13.0–17.0)
MCH: 33.1 pg (ref 26.0–34.0)
MCHC: 31.5 g/dL (ref 30.0–36.0)
MCV: 105.2 fL — ABNORMAL HIGH (ref 80.0–100.0)
Platelets: 122 10*3/uL — ABNORMAL LOW (ref 150–400)
RBC: 2.9 MIL/uL — ABNORMAL LOW (ref 4.22–5.81)
RDW: 15.9 % — ABNORMAL HIGH (ref 11.5–15.5)
WBC: 7.4 10*3/uL (ref 4.0–10.5)
nRBC: 0 % (ref 0.0–0.2)

## 2020-09-22 LAB — BASIC METABOLIC PANEL
Anion gap: 13 (ref 5–15)
BUN: 28 mg/dL — ABNORMAL HIGH (ref 8–23)
CO2: 22 mmol/L (ref 22–32)
Calcium: 7.8 mg/dL — ABNORMAL LOW (ref 8.9–10.3)
Chloride: 100 mmol/L (ref 98–111)
Creatinine, Ser: 6.68 mg/dL — ABNORMAL HIGH (ref 0.61–1.24)
GFR, Estimated: 8 mL/min — ABNORMAL LOW (ref >60)
Glucose, Bld: 101 mg/dL — ABNORMAL HIGH (ref 70–99)
Potassium: 3.8 mmol/L (ref 3.5–5.1)
Sodium: 135 mmol/L (ref 135–145)

## 2020-09-22 LAB — HEPARIN LEVEL (UNFRACTIONATED): Heparin Unfractionated: 0.32 IU/mL (ref 0.30–0.70)

## 2020-09-22 MED ORDER — APIXABAN 5 MG PO TABS
5.0000 mg | ORAL_TABLET | Freq: Two times a day (BID) | ORAL | Status: DC
  Administered 2020-09-22 – 2020-09-23 (×3): 5 mg via ORAL
  Filled 2020-09-22 (×3): qty 1

## 2020-09-22 NOTE — Consult Note (Signed)
Consultation Note Date: 09/22/2020   Patient Name: Ricky Harris  DOB: 11/04/1940  MRN: [id number]  Age / Sex: 80 y.o., male  PCP: [name]., MD Referring Physician: Antonieta Pert, MD  Reason for Consultation: Establishing goals of care  HPI/Patient Profile: 80 y.o. male  with past medical history of ESRD on hemodialysis, liver cirrhosis, hypertension, hypothyroidism, AAA with stent graft, coronary artery disease, CKD stage 4 admitted on 09/15/2020 with right chest pain x 2-3 days, cough x 1-2 weeks, exertional dyspnea. Dialysis was stopped and he was sent to ED for evaluation of severe chest pain and found to have bilateral pleural effusions and acute on chronic diastolic heart failure and found to have severe coronary calcification and three-vessel disease with EF 25-30%. Now also with atrial fibrillation with RVR. Not a good surgical candidate due to complicated co-morbidities.   Clinical Assessment and Goals of Care: I met today with Ricky Harris along with his 2 sons at bedside. He lives at home with one of his sons and his dog. He has 4 children total. Ricky Harris and his sons all have good understanding of his complicated medical issues with declining heart function, severe CAD and no options for surgery, and further complicated by liver cirrhosis and liver disease on dialysis. He has been on dialysis ~1 year. Ricky Harris is hopeful to have more time to spend at home with his dog and time with his family. He understands that his health and his symptom burden can worsen into the future and further limit him and limit his dialysis as well. We discussed listening to his body and utilizing medication to optimize and provide relief of symptoms for as long as we can. He does express that he has lived a full life and has done all he wished. He talks of being born in [place] and traveling for his work and the only places he  hasn't been are [place] and [place]. He also talks of his family who seem very supportive of him.   We further discussed his wishes and he elects at this time that he would desire DNR and would not want extraordinary measures to prolong his life. If he cannot return home and be with his dog and family this would not be good quality for him. He does wish to continue dialysis at this time. We discussed outpatient palliative care to follow for support and ongoing conversation as needed and they all agree this would be helpful.   All questions/concerns addressed. Emotional support provided.   Primary Decision Maker PATIENT    SUMMARY OF RECOMMENDATIONS   - DNR elected - Optimize QOL and symptom relief - Hopeful to return home to his dog where he lives with his son - Outpatient palliative to follow  Code Status/Advance Care Planning: DNR   Symptom Management:  Chest pain: Roxicodone 5 mg every 2 hours as needed would be recommended for recurrent chest pain/discomfort. If roxicodone ineffective for symptom relief would use as needed dilaudid po.   Palliative Prophylaxis:  Delirium Protocol, Frequent Pain Assessment, and Turn Reposition  Additional Recommendations (Limitations, Scope, Preferences): Avoid Hospitalization  Psycho-social/Spiritual:  Desire for further Chaplaincy support:no Additional Recommendations: Grief/Bereavement Support  Prognosis:  Overall prognosis poor with multivessel disease and underlying co-morbidities with limited options for treatment.   Discharge Planning: Home with Palliative Services      Primary Diagnoses: Present on Admission:  Bilateral pleural effusion  ESRD (end stage renal disease) ([place])  Cirrhosis of liver (HCC)  AAA (abdominal aortic aneurysm) without rupture (HCC)  Thrombocytopenia (HCC)  Thoracic aortic aneurysm (HCC)  Fluid overload, unspecified  Essential hypertension  Elevated troponin   I have reviewed the medical  record, interviewed the patient and family, and examined the patient. The following aspects are pertinent.  Past Medical History:  Diagnosis Date   A-fib ([place]) 09/18/2020   AAA (abdominal aortic aneurysm) (HCC)    5.3cm , magd by vascular Dr Scot Dock ,   Anemia    " slightly"   Aortic stenosis    Arthritis    knees   Ascites    Cancer ([place])    prostate   Chronic kidney disease    stage 4, mgd by Dr Justin Mend    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[facility])    Coronary artery disease    on CT scan   Gout    H/O hypercholesterolemia    Heart murmur    History of falling    recent- see ct chest results of 04/25/2018   Hypertension    Hypothyroidism    Liver cirrhosis ([place])    Prediabetes    pt denies    Renal artery stenosis (HCC)    Thrombocytopenia (HCC)    Wears glasses    Wears glasses    Social History   Socioeconomic History   Marital status: Divorced    Spouse name: Not on file   Number of children: Not on file   Years of education: Not on file   Highest education level: Not on file  Occupational History   Not on file  Tobacco Use   Smoking status: Never   Smokeless tobacco: Never  Vaping Use   Vaping Use: Never used  Substance and Sexual Activity   Alcohol use: Not Currently   Drug use: Never   Sexual activity: Not on file  Other Topics Concern   Not on file  Social History Narrative   Not on file   Social Determinants of Health   Financial Resource Strain: Not on file  Food Insecurity: Not on file  Transportation Needs: Not on file  Physical Activity: Not on file  Stress: Not on file  Social Connections: Not on file   Family History  Problem Relation Age of Onset   Diabetes Mellitus II Mother    Stroke Mother    Scheduled Meds:  aspirin EC  81 mg Oral Daily   atorvastatin  80 mg Oral QHS   darbepoetin (ARANESP) injection - DIALYSIS  40 mcg Intravenous Q Tue-HD   isosorbide mononitrate  15 mg Oral Daily   lidocaine  1 patch Transdermal Q24H    metoprolol succinate  25 mg Oral Daily   sodium chloride flush  3 mL Intravenous Q12H   sodium chloride flush  3 mL Intravenous Q12H   spironolactone  12.5 mg Oral Daily   Continuous Infusions:  sodium chloride     sodium chloride     sodium chloride     sodium chloride     heparin  1,500 Units/hr (09/22/20 0520)   PRN Meds:.sodium chloride, sodium chloride, sodium chloride, sodium chloride, acetaminophen, alteplase, diazepam, fentaNYL (SUBLIMAZE) injection, heparin, lidocaine (PF), lidocaine-prilocaine, ondansetron (ZOFRAN) IV, pentafluoroprop-tetrafluoroeth, sodium chloride flush Allergies  Allergen Reactions   Cephalexin Hives   Hydralazine Hives   Review of Systems  Constitutional:  Positive for appetite change and fatigue.  Respiratory:  Positive for cough. Negative for chest tightness.   Cardiovascular:  Negative for chest pain.  Neurological:  Positive for weakness.  Psychiatric/Behavioral:  Negative for sleep disturbance.    Physical Exam Vitals and nursing note reviewed.  Constitutional:      Appearance: He is ill-appearing.     Comments: Thin  Cardiovascular:     Rate and Rhythm: Normal rate.  Pulmonary:     Effort: No tachypnea, accessory muscle usage or respiratory distress.  Abdominal:     General: Abdomen is flat.  Neurological:     Mental Status: He is alert and oriented to person, place, and time.    Vital Signs: BP 117/61   Pulse 87   Temp 97.6 F (36.4 C) (Oral)   Resp (!) 26   Ht 6' (1.829 m)   Wt 73.7 kg   SpO2 97%   BMI 22.04 kg/m  Pain Scale: 0-10 POSS *See Group Information*: 2-Acceptable,Slightly drowsy, easily aroused Pain Score: 0-No pain   SpO2: SpO2: 97 % O2 Device:SpO2: 97 % O2 Flow Rate: .O2 Flow Rate (L/min): 2 L/min  IO: Intake/output summary:  Intake/Output Summary (Last 24 hours) at 09/22/2020 1226 Last data filed at 09/22/2020 1130 Gross per 24 hour  Intake --  Output 1500 ml  Net -1500 ml    LBM: Last BM Date:  09/16/20 Baseline Weight: Weight: 77 kg Most recent weight: Weight: 73.7 kg     Palliative Assessment/Data:     Time In: 1330 Time Out: 1430 Time Total: 60 min Greater than 50%  of this time was spent counseling and coordinating care related to the above assessment and plan.  Signed by: Vinie Sill, NP Palliative Medicine Team Pager # [phone] (M-F 8a-5p) Team Phone # [phone] (Nights/Weekends)

## 2020-09-22 NOTE — TOC Benefit Eligibility Note (Signed)
Transition of Care [facility]) Benefit Eligibility Note    Patient Details  Name: Ricky Harris MRN: [id number] Date of Birth: [birth year]   Medication/Dose: ELIQUIS  5 MG BID  Covered?: Yes  Tier: 3 Drug  Prescription Coverage Preferred Pharmacy: [facility]  Spoke with Person/Company/Phone Number:: JAY  @ [employer] #  [phone]  Co-Pay: $47.00  Prior Approval: No  Deductible: Met (OUT-OF-POCKET : UNMET)  Additional Notes: APIXABAN: NON-FORMULARY    Memory Argue Phone Number: 09/22/2020, 5:15 PM

## 2020-09-22 NOTE — Progress Notes (Signed)
OT Cancellation Note  Patient Details Name: Ricky Harris MRN: [id number] DOB: [birth year]   Cancelled Treatment:    Reason Eval/Treat Not Completed: Patient at procedure or test/ unavailable (Pt at HD upon arrival, well re-attempt as time allows.)  Aldona Bar A Tayon Parekh 09/22/2020, 10:52 AM

## 2020-09-22 NOTE — Progress Notes (Signed)
Physical Therapy Treatment Patient Details Name: Ricky Harris MRN: [id number] DOB: Jul 15, 1940 Today's Date: 09/22/2020    History of Present Illness Pt is a 80 y.o. male admitted from HD center on 09/15/20 with R-side chest pain. CXR with bilateral pleural effusion (L>R); concern for PNA. Workup for acute on chronic CHF, pleural effusions, new onset afib. S/p LHC on 7/22 which showed multivessel CAD; deemed not a surgical candidate, plan for medical management. PMH includes HTN, CAD, ESRD (HD TTS), ischemic cardiomyopathy, metabolic bone disease, anemia, prostate CA, gout, arthritis.   PT Comments    Pt with increased fatigue this session, suspect related to post-HD and hypotension (see values below). Today's session focused on transfer and gait training; pt agrees that stability improved with use of RW, and pt agreeable to use this for reduced fall risk upon return home. Pt's son present and supportive. Will continue to follow acutely to address established goals.  Sitting BP 97/61 Standing BP 103/60 Post-ambulation BP 105/60    Follow Up Recommendations  Home health PT;Supervision for mobility/OOB     Equipment Recommendations  None recommended by PT    Recommendations for Other Services       Precautions / Restrictions Precautions Precautions: Fall;Other (comment) Precaution Comments: chronic back pain; hypotension Restrictions Weight Bearing Restrictions: No    Mobility  Bed Mobility Overal bed mobility: Modified Independent             General bed mobility comments: Mod indep supine<>sit with HOB slightly elevated    Transfers Overall transfer level: Needs assistance Equipment used: None Transfers: Sit to/from Stand Sit to Stand: Min guard         General transfer comment: Min guard for balance secondary to fatigue and hypotension  Ambulation/Gait Ambulation/Gait assistance: Min guard;Supervision Gait Distance (Feet): 100 Feet Assistive device:  None;Rolling walker (2 wheeled)   Gait velocity: Decreased   General Gait Details: Initial gait ~10' without DME, close min guard for balance; additional gait distance with RW, stability much improved, progressing to intermittent min guard for balance; pt in agreement stability improved with RW use; distance limited by fatigue; pt denies dizziness   Stairs Stairs:  (deferred this session, but initiated educ with pt about stair training prior to d/c)           Wheelchair Mobility    Modified Rankin (Stroke Patients Only)       Balance Overall balance assessment: Needs assistance Sitting-balance support: No upper extremity supported;Feet supported Sitting balance-Leahy Scale: Good Sitting balance - Comments: prefers to sit flexed forward with elbows resting on knees ("this is just how I've always sat")   Standing balance support: No upper extremity supported;Bilateral upper extremity supported;During functional activity Standing balance-Leahy Scale: Fair Standing balance comment: Can static stand and take steps without UE support, unable to accept challenge; static and dynamic stability improved with RW                            Cognition Arousal/Alertness: Awake/alert Behavior During Therapy: Flat affect;WFL for tasks assessed/performed Overall Cognitive Status: Within Functional Limits for tasks assessed                                 General Comments: Difficult to read if pt "symptomatic" with mobility, not forthcoming with details and requriing specific questions; pt and son agree that pt is fairly "stoic" at baseline  Exercises      General Comments General comments (skin integrity, edema, etc.): Prolonged sitting EOB secondary to fatigue and c/o feeling "loopy" upon initial standing requiring return to sit (BP from 97/61 standing to 105/60 post-ambulation). Pt's son present and supportive; reports family available to provide necessary  assist, no significant concerns with pt's mobility for return home      Pertinent Vitals/Pain Pain Assessment: Faces Faces Pain Scale: Hurts a little bit Pain Location: Low back (chronic) Pain Descriptors / Indicators: Discomfort Pain Intervention(s): Monitored during session    Home Living                      Prior Function            PT Goals (current goals can now be found in the care plan section) Progress towards PT goals: Progressing toward goals    Frequency    Min 3X/week      PT Plan Current plan remains appropriate    Co-evaluation              AM-PAC PT "6 Clicks" Mobility   Outcome Measure  Help needed turning from your back to your side while in a flat bed without using bedrails?: None Help needed moving from lying on your back to sitting on the side of a flat bed without using bedrails?: None Help needed moving to and from a bed to a chair (including a wheelchair)?: A Little Help needed standing up from a chair using your arms (e.g., wheelchair or bedside chair)?: A Little Help needed to walk in hospital room?: A Little Help needed climbing 3-5 steps with a railing? : A Little 6 Click Score: 20    End of Session Equipment Utilized During Treatment: Gait belt Activity Tolerance: Patient tolerated treatment well;Patient limited by fatigue Patient left: in bed;with call bell/phone within reach;with bed alarm set;with family/visitor present;with nursing/sitter in room Nurse Communication: Mobility status PT Visit Diagnosis: Muscle weakness (generalized) (M62.81);Pain     Time: 1545-1610 PT Time Calculation (min) (ACUTE ONLY): 25 min  Charges:  $Gait Training: 8-22 mins $Therapeutic Activity: 8-22 mins                     Mabeline Caras, PT, DPT Acute Rehabilitation Services  Pager [phone] Office [place] 09/22/2020, 4:41 PM

## 2020-09-22 NOTE — Discharge Instructions (Signed)

## 2020-09-22 NOTE — Progress Notes (Signed)
PROGRESS NOTE    Ricky Harris  TFT:[id number] DOB: 23-Jan-1941 DOA: 09/15/2020 PCP: [name]., MD   Chief Complaint  Patient presents with   Chest Pain    Brief Narrative: 80 year old male with ESRD on HD, liver cirrhosis, HTN, hypothyroidism, AAA with stent graft, CAD seen in the CT scan.  From dialysis center for evaluation of right-sided chest pain. He was seen in the ED EKG showed sinus rhythm chest x-ray with left more than right pleural effusion, CTA chest abdomen pelvis negative for PE BNP more than 4500, showed new moderate pleural effusion nephro was consulted cardio was consulted and patient was admitted. Patient underwent cardiac cath-severe coronary calcification involving the LAD and right coronary artery with mild calcification of the left circumflex vessel, severe multivessel CAD. Cardiovascular surgery was consulted there was discussion on CABG revascularization versus arthrectomy.  Subsequently due to high risk and with poor prognosis turndown for surgery by cardiovascular team.   Subjective: Seen and examined this morning in dialysis. He has no chest pain no nausea no vomiting.  Resting comfortably. Remains on heparin drip.  Assessment & Plan:  Acute on chronic combined systolic and diastolic CHF: Bilateral pleural effusions Continue to address fluid management with dialysis.  EF decreased to 25-30% with multivessel CAD. Seen by cardiology and following closely. Monitor intake output Daily weight as below Net IO Since Admission: -3,412.54 mL [09/22/20 0752]  Filed Weights   09/20/20 0545 09/21/20 0300 09/22/20 0500  Weight: 72 kg 77.1 kg 75.2 kg    Ischemic cardiomyopathy Slightly positive troponin Multivessel coronary artery disease seen in the cardiac cath-with high-grade LAD and right coronary stenosis Turndown for surgery by cardiac thoracic team.Continue aspirin, Imdur, Lipitor and metoprolol.  On heparin drip hopefully can transition to [place].  New  onset A. fib after dialysis on 7/21.spontaneously converted, continue Toprol on anticoagulation with heparin   Dyslipidemia cont continue statin  ESRD on HD TTS, managed by nephrology on board-dialysis today 7/26.  Metabolic bone disease calcium/phosphorus stable continue home binders/VDRF  Liver cirrhosis:Compensated  History of AAA underwent CTA with patent aortic stent graft with 6.1 cm native sac diameter up from 5.9 need outpatient close follow-up  Possible Community-acquired pneumonia chest x-ray suspicious for bilateral left greater than right airspace disease.He is afebrile, no leukocytosis procalcitonin was 0.6.  Patient completed antibiotics  Thrombocytopenia monitor closely, patient remains on heparin=,hopefully change to DOAC.  Improving. Recent Labs  Lab 09/18/20 0107 09/19/20 0358 09/20/20 0505 09/21/20 0254 09/22/20 0056  PLT 87* 120* 111* 123* 122*   Anemia of chronic disease hemoglobin stable, needs close monitoring due to anticoagulation use  Recent Labs  Lab 09/18/20 1710 09/19/20 0358 09/20/20 0505 09/21/20 0254 09/22/20 0056  HGB 9.9* 10.4* 9.5* 9.7* 9.6*  HCT 29.0* 32.1* 29.6* 30.3* 30.5*   Moderate protein calorie malnutrition augment diet as tolerated.  Goals of care: Overall poor prognosis given his multivessel coronary artery disease.Marland Kitchen PMT consult  Diet Order             Diet Heart Room service appropriate? Yes; Fluid consistency: Thin  Diet effective now                 Patient's Body mass index is 22.48 kg/m. DVT prophylaxis: SCD's Start: 09/18/20 1801 Code Status:   Code Status: Full Code  Family Communication: plan of care discussed with patient and son at bedside.  Status is: Inpatient Remains inpatient appropriate because:IV treatments appropriate due to intensity of illness or inability to  take PO and Inpatient level of care appropriate due to severity of illness Dispo:  Patient From: Home  Planned Disposition: Home pending  further palliative care evaluation.  Medically stable for discharge: No  Unresulted Labs (From admission, onward)     Start     Ordered   09/22/20 7782  Basic metabolic panel  Daily,   R     Question:  Specimen collection method  Answer:  Lab=Lab collect   09/21/20 1440   09/21/20 0500  Heparin level (unfractionated)  Daily,   R     Question:  Specimen collection method  Answer:  Lab=Lab collect  See Hyperspace for full Linked Orders Report.   09/19/20 2227   09/20/20 0500  CBC  Daily,   R     Question:  Specimen collection method  Answer:  Lab=Lab collect  See Hyperspace for full Linked Orders Report.   09/19/20 2227   09/16/20 0749  Expectorated Sputum Assessment w Gram Stain, Rflx to Resp Cult  Once,   R        09/16/20 0748   09/16/20 0749  Strep pneumoniae urinary antigen  Once,   STAT        09/16/20 0748   09/16/20 0749  Legionella Pneumophila Serogp 1 Ur Ag  Once,   STAT        09/16/20 0748           Medications reviewed:  Scheduled Meds:  aspirin EC  81 mg Oral Daily   atorvastatin  80 mg Oral QHS   darbepoetin (ARANESP) injection - DIALYSIS  40 mcg Intravenous Q Tue-HD   isosorbide mononitrate  15 mg Oral Daily   lidocaine  1 patch Transdermal Q24H   metoprolol succinate  25 mg Oral Daily   sodium chloride flush  3 mL Intravenous Q12H   sodium chloride flush  3 mL Intravenous Q12H   spironolactone  12.5 mg Oral Daily   Continuous Infusions:  sodium chloride     sodium chloride     sodium chloride     sodium chloride     heparin 1,500 Units/hr (09/22/20 0520)   Consultants:see note  Procedures:see note Antimicrobials: Anti-infectives (From admission, onward)    Start     Dose/Rate Route Frequency Ordered Stop   09/17/20 0000  amoxicillin-clavulanate (AUGMENTIN) 500-125 MG tablet        1 tablet Oral 2 times daily 09/17/20 0945 09/23/20 2359   09/16/20 1600  ampicillin-sulbactam (UNASYN) 1.5 g in sodium chloride 0.9 % 100 mL IVPB        1.5 g 200  mL/hr over 30 Minutes Intravenous Every 24 hours 09/16/20 0931 09/21/20 1559   09/16/20 0800  cefTRIAXone (ROCEPHIN) 1 g in sodium chloride 0.9 % 100 mL IVPB  Status:  Discontinued        1 g 200 mL/hr over 30 Minutes Intravenous Every 24 hours 09/16/20 0753 09/16/20 0903   09/16/20 0800  azithromycin (ZITHROMAX) 250 mg in dextrose 5 % 125 mL IVPB  Status:  Discontinued        250 mg 125 mL/hr over 60 Minutes Intravenous Every 24 hours 09/16/20 0753 09/16/20 0903      Culture/Microbiology    Component Value Date/Time   SDES PERITONEAL FLUID 02/19/2020 1603   SDES PERITONEAL FLUID 02/19/2020 1603   SPECREQUEST ABDOMEN 02/19/2020 1603   SPECREQUEST ABDOMEN 02/19/2020 1603   CULT  02/19/2020 1603    NO GROWTH 5 DAYS Performed at [facility],  [address], [place], [place] [address]    REPTSTATUS 02/19/2020 FINAL 02/19/2020 1603   REPTSTATUS 02/27/2020 FINAL 02/19/2020 1603    Other culture-see note  Objective: Vitals: Today's Vitals   09/21/20 1925 09/21/20 2100 09/22/20 0500 09/22/20 0651  BP:  (!) 100/57  107/60  Pulse:  81  76  Resp:  15  16  Temp:  98.6 F (37 C)  98.5 F (36.9 C)  TempSrc:  Oral  Oral  SpO2:  100%  94%  Weight:   75.2 kg   Height:      PainSc: 0-No pain       Intake/Output Summary (Last 24 hours) at 09/22/2020 0752 Last data filed at 09/21/2020 0859 Gross per 24 hour  Intake 220 ml  Output --  Net 220 ml    Filed Weights   09/20/20 0545 09/21/20 0300 09/22/20 0500  Weight: 72 kg 77.1 kg 75.2 kg   Weight change: -1.9 kg  Intake/Output from previous day: 07/25 0701 - 07/26 0700 In: 220 [P.O.:220] Out: -  Intake/Output this shift: No intake/output data recorded. Filed Weights   09/20/20 0545 09/21/20 0300 09/22/20 0500  Weight: 72 kg 77.1 kg 75.2 kg   Examination: General exam: AAOx3, frail, comfortable.  HEENT:Oral mucosa moist, Ear/Nose WNL grossly, dentition normal. Respiratory system: bilaterally diminished, no use of  accessory muscle Cardiovascular system: S1 & S2 +, No JVD,. Gastrointestinal system: Abdomen soft, NT,ND, BS+ Nervous System:Alert, awake, moving extremities and grossly nonfocal Extremities: no edema, distal peripheral pulses palpable.  Skin: No rashes,no icterus. MSK: Normal muscle bulk,tone, power  Data Reviewed: I have personally reviewed following labs and imaging studies CBC: Recent Labs  Lab 09/15/20 1526 09/15/20 2025 09/18/20 0107 09/18/20 1706 09/18/20 1710 09/19/20 0358 09/20/20 0505 09/21/20 0254 09/22/20 0056  WBC 6.7   < > 5.0  --   --  6.1 5.3 5.9 7.4  NEUTROABS 5.8  --   --   --   --   --   --   --   --   HGB 10.2*   < > 10.2*   < > 9.9* 10.4* 9.5* 9.7* 9.6*  HCT 33.2*   < > 31.8*   < > 29.0* 32.1* 29.6* 30.3* 30.5*  MCV 108.1*   < > 104.3*  --   --  104.6* 102.8* 103.1* 105.2*  PLT 90*   < > 87*  --   --  120* 111* 123* 122*   < > = values in this interval not displayed.    Basic Metabolic Panel: Recent Labs  Lab 09/15/20 2025 09/16/20 0335 09/18/20 0107 09/18/20 1706 09/18/20 1710 09/19/20 0358 09/20/20 0505 09/21/20 0254 09/22/20 0056  NA 137   < > 138   < > 139 134* 135 132* 135  K 3.4*   < > 3.3*   < > 3.8 3.9 3.5 3.5 3.8  CL 99   < > 99  --   --  99 98 96* 100  CO2 24   < > 28  --   --  23 26 25 22   GLUCOSE 90   < > 141*  --   --  91 105* 104* 101*  BUN 31*   < > 12  --   --  23 13 21  28*  CREATININE 7.61*   < > 3.54*  --   --  5.34* 4.06* 5.45* 6.68*  CALCIUM 8.0*   < > 8.2*  --   --  8.0*  8.5* 7.8* 7.8*  MG  --   --  1.8  --   --   --   --  1.9  --   PHOS 4.2  --   --   --   --   --   --  3.3  --    < > = values in this interval not displayed.    GFR: Estimated Creatinine Clearance: 9.7 mL/min (A) (by C-G formula based on SCr of 6.68 mg/dL (H)). Liver Function Tests: Recent Labs  Lab 09/15/20 1526 09/15/20 2025 09/21/20 0254  AST 25  --   --   ALT 14  --   --   ALKPHOS 108  --   --   BILITOT 1.0  --   --   PROT 6.4*  --   --    ALBUMIN 3.4* 2.8* 2.7*    Recent Labs  Lab 09/15/20 1526  LIPASE 27    No results for input(s): AMMONIA in the last 168 hours. Coagulation Profile: Recent Labs  Lab 09/15/20 1526  INR 1.2    Cardiac Enzymes: No results for input(s): CKTOTAL, CKMB, CKMBINDEX, TROPONINI in the last 168 hours. BNP (last 3 results) No results for input(s): PROBNP in the last 8760 hours. HbA1C: No results for input(s): HGBA1C in the last 72 hours. CBG: No results for input(s): GLUCAP in the last 168 hours. Lipid Profile: No results for input(s): CHOL, HDL, LDLCALC, TRIG, CHOLHDL, LDLDIRECT in the last 72 hours.  Thyroid Function Tests: No results for input(s): TSH, T4TOTAL, FREET4, T3FREE, THYROIDAB in the last 72 hours. Anemia Panel: No results for input(s): VITAMINB12, FOLATE, FERRITIN, TIBC, IRON, RETICCTPCT in the last 72 hours. Sepsis Labs: Recent Labs  Lab 09/16/20 0335  PROCALCITON 0.60     Recent Results (from the past 240 hour(s))  Resp Panel by RT-PCR (Flu A&B, Covid) Nasopharyngeal Swab     Status: None   Collection Time: 09/15/20  4:14 PM   Specimen: Nasopharyngeal Swab; Nasopharyngeal(NP) swabs in vial transport medium  Result Value Ref Range Status   SARS Coronavirus 2 by RT PCR NEGATIVE NEGATIVE Final    Comment: (NOTE) SARS-CoV-2 target nucleic acids are NOT DETECTED.  The SARS-CoV-2 RNA is generally detectable in upper respiratory specimens during the acute phase of infection. The lowest concentration of SARS-CoV-2 viral copies this assay can detect is 138 copies/mL. A negative result does not preclude SARS-Cov-2 infection and should not be used as the sole basis for treatment or other patient management decisions. A negative result may occur with  improper specimen collection/handling, submission of specimen other than nasopharyngeal swab, presence of viral mutation(s) within the areas targeted by this assay, and inadequate number of viral copies(<138  copies/mL). A negative result must be combined with clinical observations, patient history, and epidemiological information. The expected result is Negative.  Fact Sheet for Patients:  EntrepreneurPulse.com.au  Fact Sheet for Healthcare Providers:  IncredibleEmployment.be  This test is no t yet approved or cleared by the [place] FDA and  has been authorized for detection and/or diagnosis of SARS-CoV-2 by FDA under an Emergency Use Authorization (EUA). This EUA will remain  in effect (meaning this test can be used) for the duration of the COVID-19 declaration under Section 564(b)(1) of the Act, 21 U.S.C.section 360bbb-3(b)(1), unless the authorization is terminated  or revoked sooner.       Influenza A by PCR NEGATIVE NEGATIVE Final   Influenza B by PCR NEGATIVE NEGATIVE Final    Comment: (NOTE)  The Xpert Xpress SARS-CoV-2/FLU/RSV plus assay is intended as an aid in the diagnosis of influenza from Nasopharyngeal swab specimens and should not be used as a sole basis for treatment. Nasal washings and aspirates are unacceptable for Xpert Xpress SARS-CoV-2/FLU/RSV testing.  Fact Sheet for Patients: EntrepreneurPulse.com.au  Fact Sheet for Healthcare Providers: IncredibleEmployment.be  This test is not yet approved or cleared by the [place] FDA and has been authorized for detection and/or diagnosis of SARS-CoV-2 by FDA under an Emergency Use Authorization (EUA). This EUA will remain in effect (meaning this test can be used) for the duration of the COVID-19 declaration under Section 564(b)(1) of the Act, 21 U.S.C. section 360bbb-3(b)(1), unless the authorization is terminated or revoked.  Performed at [facility], [place] [address] [place], [place] [address]       Radiology Studies: No results found.   LOS: 7 days   Antonieta Pert, MD [facility]  09/22/2020, 7:52 AM

## 2020-09-22 NOTE — Progress Notes (Signed)
ANTICOAGULATION CONSULT NOTE  Pharmacy Consult for heparin Indication:  Afib and r/o ACS  Patient Measurements: Height: 6' (182.9 cm) Weight: 75.2 kg (165 lb 12.6 oz) IBW/kg (Calculated) : 77.6 Heparin Dosing Wt: 72 kg  Vital Signs: Temp: 98.5 F (36.9 C) (07/26 0651) Temp Source: Oral (07/26 0651) BP: 107/60 (07/26 0651) Pulse Rate: 76 (07/26 0651)  Labs: Recent Labs    09/20/20 0505 09/20/20 1307 09/21/20 0254 09/22/20 0056  HGB 9.5*  --  9.7* 9.6*  HCT 29.6*  --  30.3* 30.5*  PLT 111*  --  123* 122*  HEPARINUNFRC 0.39 0.38 0.37 0.32  CREATININE 4.06*  --  5.45* 6.68*     Estimated Creatinine Clearance: 9.7 mL/min (A) (by C-G formula based on SCr of 6.68 mg/dL (H)).   Medications:  Medications Prior to Admission  Medication Sig Dispense Refill Last Dose   amLODipine (NORVASC) 5 MG tablet Take 5 mg by mouth daily.   09/15/2020   atorvastatin (LIPITOR) 10 MG tablet Take 10 mg by mouth daily.   09/15/2020   carvedilol (COREG) 12.5 MG tablet Take 12.5 mg by mouth 2 (two) times daily.   09/15/2020 at 0900   febuxostat (ULORIC) 40 MG tablet Take 1 tablet (40 mg total) by mouth daily. 30 tablet 3 Past Month   lactulose (CHRONULAC) 10 GM/15ML solution Take 15 mLs (10 g total) by mouth daily. 236 mL 1 Past Month   multivitamin (RENA-VIT) TABS tablet Take 1 tablet by mouth daily. 30 tablet 0 09/15/2020   calcitRIOL (ROCALTROL) 0.25 MCG capsule Take 1 capsule (0.25 mcg total) by mouth every Tuesday, Thursday, and Saturday at 6 PM. (Patient not taking: No sig reported) 30 capsule 3 Not Taking   calcium acetate (PHOSLO) 667 MG capsule Take 1 capsule (667 mg total) by mouth 3 (three) times daily with meals. 90 capsule 2    cloNIDine (CATAPRES) 0.1 MG tablet Take 1 tablet (0.1 mg total) by mouth daily. (Patient not taking: No sig reported) 30 tablet 0 Not Taking   pantoprazole (PROTONIX) 40 MG tablet Take 1 tablet (40 mg total) by mouth daily. 30 tablet 1    traMADol (ULTRAM) 50 MG  tablet Take 1 tablet (50 mg total) by mouth every 8 (eight) hours as needed for moderate pain. (Patient not taking: Reported on 09/17/2020) 8 tablet 0 Not Taking   Scheduled:   aspirin EC  81 mg Oral Daily   atorvastatin  80 mg Oral QHS   darbepoetin (ARANESP) injection - DIALYSIS  40 mcg Intravenous Q Tue-HD   isosorbide mononitrate  15 mg Oral Daily   lidocaine  1 patch Transdermal Q24H   metoprolol succinate  25 mg Oral Daily   sodium chloride flush  3 mL Intravenous Q12H   sodium chloride flush  3 mL Intravenous Q12H   spironolactone  12.5 mg Oral Daily   Infusions:   sodium chloride     sodium chloride     sodium chloride     sodium chloride     heparin 1,500 Units/hr (09/22/20 0520)    Assessment: 80yo male admitted for acute on chronic CHF w/new onset Afib. LHC with mvCAD, revascularization plan pending.   Heparin level is therapeutic at 0.32, on 1500 units/hr. No s/sx of bleeding or infusion issues documented. CBC stable.   Goal of Therapy:  Heparin level 0.3-0.7 units/ml Monitor platelets by anticoagulation protocol: Yes   Plan:  Continue IV heparin gtt at 1500 units/hr Daily heparin level, CBC  Antonietta Jewel,  PharmD, [facility] Clinical Pharmacist  Phone: [phone] 09/22/2020 7:44 AM  Please check AMION for all [facility] phone numbers After 10:00 PM, call [place] [phone]

## 2020-09-22 NOTE — Progress Notes (Addendum)
The patient has been seen in conjunction with Harlan Stains, NP. All aspects of care have been considered and discussed. The patient has been personally interviewed, examined, and all clinical data has been reviewed.  From cardiac perspective, patient has very poor prognosis.  We have recommended palliative care and alignment of goals of care. Anticoagulation with Eliquis will be our recommendation.  He would be increased risk of bleeding given cirrhosis and esophageal varices.  Patient states he has had no recent episodes of bleeding. Relative to ischemic heart disease, he has been turned down for surgery, has high risk of bleeding on dual antiplatelet therapy, right coronary disease is not amenable to therapy due to tortuosity, LAD could be treated with orbital atherectomy however there is still concerned about PCI with stenting given possibility of bleeding.  For that reason, medical therapy has been recommended and depending upon discussions concerning overall goals of care, PCI could be palliative if anginal symptoms warrant more aggressive management. Discussed atrial fibrillation with EP yesterday, and he is not a good candidate for antiarrhythmic therapy.  He is not a candidate for ablation.  [facility] will sign off.   Medication Recommendations: Eliquis for stroke but with close follow-up for bleeding complications. Other recommendations (labs, testing, etc): Palliative care consult to align goals of care.  We do not recommend PCI unless refractory angina due to high risk of complications and possibility of bleeding on antiplatelet therapy plus antithrombotic therapy. Follow up as an outpatient: As needed   Progress Note  Patient Name: Ricky Harris Date of Encounter: 09/22/2020  [facility] Cardiologist: None   Subjective   Seen in HD. No recurrent episodes of chest pain.   Inpatient Medications    Scheduled Meds:  aspirin EC  81 mg Oral Daily   atorvastatin  80 mg  Oral QHS   darbepoetin (ARANESP) injection - DIALYSIS  40 mcg Intravenous Q Tue-HD   isosorbide mononitrate  15 mg Oral Daily   lidocaine  1 patch Transdermal Q24H   metoprolol succinate  25 mg Oral Daily   sodium chloride flush  3 mL Intravenous Q12H   sodium chloride flush  3 mL Intravenous Q12H   spironolactone  12.5 mg Oral Daily   Continuous Infusions:  sodium chloride     sodium chloride     sodium chloride     sodium chloride     heparin 1,500 Units/hr (09/22/20 0520)   PRN Meds: sodium chloride, sodium chloride, sodium chloride, sodium chloride, acetaminophen, alteplase, diazepam, fentaNYL (SUBLIMAZE) injection, heparin, lidocaine (PF), lidocaine-prilocaine, ondansetron (ZOFRAN) IV, pentafluoroprop-tetrafluoroeth, sodium chloride flush   Vital Signs    Vitals:   09/22/20 0900 09/22/20 0930 09/22/20 1000 09/22/20 1030  BP: 109/60 (!) 110/59 (!) 116/53 115/60  Pulse: 77 82 79 85  Resp: (!) 21 (!) 26 17 19   Temp:      TempSrc:      SpO2:      Weight:      Height:       No intake or output data in the 24 hours ending 09/22/20 1047 Last 3 Weights 09/22/2020 09/22/2020 09/21/2020  Weight (lbs) 165 lb 12.6 oz 165 lb 12.6 oz 169 lb 15.6 oz  Weight (kg) 75.2 kg 75.2 kg 77.1 kg      Telemetry    Unable to review - Personally Reviewed  ECG    No new tracing  Physical Exam   GEN: thin, pale older male   Neck: No JVD Cardiac: RRR,  no murmurs, rubs, or gallops.  Respiratory: Clear to auscultation bilaterally. GI: Soft, nontender, non-distended  MS: No edema; No deformity. Neuro:  Nonfocal  Psych: Normal affect   Labs    High Sensitivity Troponin:   Recent Labs  Lab 09/15/20 1526 09/15/20 1806 09/18/20 0216 09/18/20 0458  TROPONINIHS 96* 87* 103* 110*      Chemistry Recent Labs  Lab 09/15/20 1526 09/15/20 2025 09/16/20 0335 09/20/20 0505 09/21/20 0254 09/22/20 0056  NA 140 137   < > 135 132* 135  K 3.2* 3.4*   < > 3.5 3.5 3.8  CL 101 99   < > 98  96* 100  CO2 27 24   < > 26 25 22   GLUCOSE 82 90   < > 105* 104* 101*  BUN 29* 31*   < > 13 21 28*  CREATININE 7.10* 7.61*   < > 4.06* 5.45* 6.68*  CALCIUM 8.3* 8.0*   < > 8.5* 7.8* 7.8*  PROT 6.4*  --   --   --   --   --   ALBUMIN 3.4* 2.8*  --   --  2.7*  --   AST 25  --   --   --   --   --   ALT 14  --   --   --   --   --   ALKPHOS 108  --   --   --   --   --   BILITOT 1.0  --   --   --   --   --   GFRNONAA 7* 7*   < > 14* 10* 8*  ANIONGAP 12 14   < > 11 11 13    < > = values in this interval not displayed.     Hematology Recent Labs  Lab 09/20/20 0505 09/21/20 0254 09/22/20 0056  WBC 5.3 5.9 7.4  RBC 2.88* 2.94* 2.90*  HGB 9.5* 9.7* 9.6*  HCT 29.6* 30.3* 30.5*  MCV 102.8* 103.1* 105.2*  MCH 33.0 33.0 33.1  MCHC 32.1 32.0 31.5  RDW 15.7* 15.8* 15.9*  PLT 111* 123* 122*    BNP Recent Labs  Lab 09/15/20 1618  BNP >4,500.0*     DDimer No results for input(s): DDIMER in the last 168 hours.   Radiology    No results found.  Cardiac Studies   Echo 09/16/2020   1. Left ventricular ejection fraction, by estimation, is 25 to 30%. The  left ventricle has severely decreased function. The left ventricle  demonstrates global hypokinesis. Left ventricular diastolic parameters are  consistent with Grade I diastolic  dysfunction (impaired relaxation).   2. Right ventricular systolic function is mildly reduced. The right  ventricular size is normal. There is normal pulmonary artery systolic  pressure. The estimated right ventricular systolic pressure is 66.5 mmHg.   3. Left atrial size was moderately dilated.   4. Right atrial size was mildly dilated.   5. The mitral valve is normal in structure. Trivial mitral valve  regurgitation. No evidence of mitral stenosis. Moderate mitral annular  calcification.   6. The aortic valve is tricuspid. Aortic valve regurgitation is not  visualized. Mild to moderate aortic valve sclerosis/calcification is  present, without any  evidence of aortic stenosis.   7. Aortic dilatation noted. There is mild dilatation of the ascending  aorta, measuring 40 mm.   8. The inferior vena cava is dilated in size with >50% respiratory  variability, suggesting right atrial pressure of 8  mmHg.       Cath 09/18/2020    Prox LAD to Mid LAD lesion is 85% stenosed.   Mid LAD lesion is 50% stenosed.   Dist LAD lesion is 20% stenosed.   Prox Cx to Mid Cx lesion is 65% stenosed.   Prox RCA lesion is 95% stenosed.   Mid RCA lesion is 90% stenosed.   LV end diastolic pressure is mildly elevated.   Hemodynamic findings consistent with mild pulmonary hypertension.   There is severe coronary calcification involving the LAD and right coronary artery with mild calcification of the left circumflex vessel.   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   Mild right heart pressure elevation with mild pulmonary hypertension.   RECOMMENDATION: The patient has severe coronary calcification and three-vessel disease.  Echo Doppler has demonstrated reduced EF in the 25 to 30% range.  Recommend initial surgical consultation for consideration of possible CABG revascularization surgery.  However with the patient's significant comorbidities, if surgery is excluded, will need to review with colleagues regarding potential orbital atherectomy and intervention into the LAD and RCA.  We will consider reinstitution of low-dose heparin much later today and this patient with recent PAF and ulcerated plaque in the proximal RCA.  Patient Profile     80 y.o. 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 Maintaining NSR. Poor candidate for further AAD from curbside discussion with EP.  -- treated with metoprolol and IV heparin. Will likely plan to convert to [facility]. Reviewed with PharmD, consider Eliquis vs coumadin. May be a better candidate for Eliquis given his high bleeding risk with cirrhosis/esophageal varies. Reports he has had no bleeding issues recently.   Acute combined CHF/ICM: echo this admission with decline in EF to 25-30% with global hypokinesis compared to normal EF a year ago.  -- cath noted above -- Rx therapy with metoprolol, spiro -- volume management per HD  CAD: R/LHC above with prox LAD 85%, mid LAD 50%, LCX 65%, prox RCA 95% and mid 90%, CI 3.55 mean PA 27 PCWP not reported, LVEDP 19. Seen by TCTS, not a candidate for surgery. Films reviewed by interventional MD with recommendations for conservative management unless refractory angina given his complex lesions and high bleeding risk. So far has had no further episodes of chest pain even with HD sessions.  -- on ASA, statin, BB, Imdur  ESRD on HD: per nephrology  Cirrhosis: s/p partial gastrectomy with multiple esophageal varies banding procedures.   Hx of AAA: s/p endovascular repair in 2020. CT showed patent aortic stent graft with 6.1cm native sac diameter which had previously been 5.9cm.   For questions or updates, please contact [place] Please consult [email or web address] for contact info under        Signed, Reino Bellis, NP  09/22/2020, 10:48 AM

## 2020-09-22 NOTE — Progress Notes (Signed)
PT Cancellation Note  Patient Details Name: LINKYN GOBIN MRN: [id number] DOB: [birth year]   Cancelled Treatment:    Reason Eval/Treat Not Completed: Patient at procedure or test/unavailable. Will follow-up for PT treatment as schedule permits.  Mabeline Caras, PT, DPT Acute Rehabilitation Services  Pager [phone] Office [employer] 09/22/2020, 8:31 AM

## 2020-09-22 NOTE — Progress Notes (Signed)
[facility])  Referral received for outpatient palliative care services once Mr. Melchior discharges.  ACC will continue to follow.  Venia Carbon RN, BSN, [facility] Liaison

## 2020-09-22 NOTE — Progress Notes (Signed)
[facility] Progress Note   Subjective:   Seen on HD  Objective Vitals:   09/22/20 1100 09/22/20 1130 09/22/20 1145 09/22/20 1224  BP: 106/73 (!) 114/58 117/61 127/64  Pulse: 82 85 87 88  Resp: (!) 23 (!) 22 (!) 26 18  Temp:  97.6 F (36.4 C) 97.6 F (36.4 C) 98 F (36.7 C)  TempSrc:  Oral Oral Oral  SpO2:   97% 99%  Weight:   73.7 kg   Height:       Physical Exam General: Well appearing man, NAD. Room air. Heart: RRR; no murmur Lungs: CTAB Abdomen: soft, non-tender Extremities: No LE edema Dialysis Access: L AVF + bruit  Additional Objective Labs: Basic Metabolic Panel: Recent Labs  Lab 09/15/20 2025 09/16/20 0335 09/20/20 0505 09/21/20 0254 09/22/20 0056  NA 137   < > 135 132* 135  K 3.4*   < > 3.5 3.5 3.8  CL 99   < > 98 96* 100  CO2 24   < > 26 25 22   GLUCOSE 90   < > 105* 104* 101*  BUN 31*   < > 13 21 28*  CREATININE 7.61*   < > 4.06* 5.45* 6.68*  CALCIUM 8.0*   < > 8.5* 7.8* 7.8*  PHOS 4.2  --   --  3.3  --    < > = values in this interval not displayed.    Liver Function Tests: Recent Labs  Lab 09/15/20 1526 09/15/20 2025 09/21/20 0254  AST 25  --   --   ALT 14  --   --   ALKPHOS 108  --   --   BILITOT 1.0  --   --   PROT 6.4*  --   --   ALBUMIN 3.4* 2.8* 2.7*    Recent Labs  Lab 09/15/20 1526  LIPASE 27    CBC: Recent Labs  Lab 09/15/20 1526 09/15/20 2025 09/18/20 0107 09/18/20 1706 09/19/20 0358 09/20/20 0505 09/21/20 0254 09/22/20 0056  WBC 6.7   < > 5.0  --  6.1 5.3 5.9 7.4  NEUTROABS 5.8  --   --   --   --   --   --   --   HGB 10.2*   < > 10.2*   < > 10.4* 9.5* 9.7* 9.6*  HCT 33.2*   < > 31.8*   < > 32.1* 29.6* 30.3* 30.5*  MCV 108.1*   < > 104.3*  --  104.6* 102.8* 103.1* 105.2*  PLT 90*   < > 87*  --  120* 111* 123* 122*   < > = values in this interval not displayed.   Studies/Results: No results found. Medications:  sodium chloride     sodium chloride     sodium chloride     sodium  chloride      apixaban  5 mg Oral BID   aspirin EC  81 mg Oral Daily   atorvastatin  80 mg Oral QHS   darbepoetin (ARANESP) injection - DIALYSIS  40 mcg Intravenous Q Tue-HD   isosorbide mononitrate  15 mg Oral Daily   lidocaine  1 patch Transdermal Q24H   metoprolol succinate  25 mg Oral Daily   sodium chloride flush  3 mL Intravenous Q12H   sodium chloride flush  3 mL Intravenous Q12H   spironolactone  12.5 mg Oral Daily    Dialysis Orders: TTS [place] 4hr, 350/500, EDW 77.5kg, 3K/2.5Ca, AVF, heparin 3000 bolus - Venofer 50mg   IV q week - Calcitriol 0.28mcg PO q HD   Assessment/Plan:  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 Not a candidate for CABG. Medical therapy per cards. GOC - PMT consulted for [place] Chest pain/B pleural effusions/pneumonia: CTA neg for PE/dissection. S/p extra HD on 7/20. On Unasyn for pneumonia coverage.   ESRD: Usual TTS schedule. HD today.   Hypertension/volume: BP low/normal, on metoprolol for now. 3-4kg under  Anemia: Hgb 9.5 - Start ESA. Aranesp 40 with HD 7/26.   Metabolic bone disease: Ca/Phos ok - continue home binders/VDRA.  Cirrhosis/thrombocytopenia  Elevated Trop: Follow trends  AAA/new ascending thoracic AA: For OP follow-up per notes.  Thrombocytopenia - improving.   New A-Fib: Per primary team   Kelly Splinter, MD 09/22/2020, 1:32 PM

## 2020-09-22 NOTE — TOC Initial Note (Signed)
Transition of Care [facility]) - Initial/Assessment Note    Patient Details  Name: Ricky Harris MRN: [id number] Date of Birth: 07/23/1940  [facility]) CM/SW Contact:    Bethena Roys, RN Phone Number: 09/22/2020, 4:56 PM  Clinical Narrative:  Risk for readmission assessment completed. Prior to arrival patient was from home with family support. Case Manager received consult for Eliquis- Benefits check submitted and Case Manager will follow for cost. Case Manager received consult for outpatient palliative services. Case Manager spoke with the patient and he is agreeable to outpatient palliative services- no preference and Case Manager called [facility] Outpatient palliative will call the patient once he gets home. Patient declines outpatient physical therapy at this time. Case Manager will continue to follow for additional needs.                Expected Discharge Plan: Home/Self Care Barriers to Discharge: Continued Medical Work up   Patient Goals and CMS Choice Patient states their goals for this hospitalization and ongoing recovery are:: to return home with family support.   Choice offered to / list presented to : NA  Expected Discharge Plan and Services Expected Discharge Plan: Home/Self Care In-house Referral: Hospice / Palliative Care Discharge Planning Services: CM Consult Post Acute Care Choice:  (Palliative Care to Consult at home.) Living arrangements for the past 2 months: Single Family Home Expected Discharge Date: 09/17/20                 DME Agency: NA       HH Arranged:  (Patient declined outpatient PT services. Agreeable to outpatient palliative services.)     Prior Living Arrangements/Services Living arrangements for the past 2 months: Single Family Home Lives with:: Adult Children Patient language and need for interpreter reviewed:: Yes Do you feel safe going back to the place where you live?: Yes      Need for Family  Participation in Patient Care: Yes (Comment) Care giver support system in place?: Yes (comment)   Criminal Activity/Legal Involvement Pertinent to Current Situation/Hospitalization: No - Comment as needed  Activities of Daily Living Home Assistive Devices/Equipment: None ADL Screening (condition at time of admission) Patient's cognitive ability adequate to safely complete daily activities?: No Is the patient deaf or have difficulty hearing?: No Does the patient have difficulty seeing, even when wearing glasses/contacts?: No Does the patient have difficulty concentrating, remembering, or making decisions?: No Patient able to express need for assistance with ADLs?: Yes Does the patient have difficulty dressing or bathing?: No Independently performs ADLs?: No Communication: Independent Dressing (OT): Independent Grooming: Independent Feeding: Independent Bathing: Independent Toileting: Independent In/Out Bed: Independent Walks in Home: Independent Does the patient have difficulty walking or climbing stairs?: No Weakness of Legs: None Weakness of Arms/Hands: None  Permission Sought/Granted Permission sought to share information with : Family Supports, Case Freight forwarder, Chartered certified accountant granted to share information with : Yes, Verbal Permission Granted     Permission granted to share info w AGENCY: Cabin crew for outpatient palliative services.        Emotional Assessment Appearance:: Appears stated age Attitude/Demeanor/Rapport: Engaged Affect (typically observed): Appropriate Orientation: : Oriented to Situation, Oriented to  Time, Oriented to Place, Oriented to Self Alcohol / Substance Use: Not Applicable Psych Involvement: No (comment)  Admission diagnosis:  Bilateral pleural effusion [J90] Patient Active Problem List   Diagnosis Date Noted   A-fib ([place]) 09/18/2020   CAP (community acquired pneumonia) 09/17/2020  Bilateral pleural  effusion 09/15/2020   Thoracic aortic aneurysm ([place]) 09/15/2020   Elevated troponin 09/15/2020   Chest pain 09/15/2020   Dependence on renal dialysis ([place]) 07/13/2020   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[place]) 07/13/2020   Pure hypercholesterolemia 07/13/2020   Lumbar radiculopathy 05/22/2020   Hypercalcemia 04/25/2020   Fluid overload, unspecified 04/14/2020   Impaired cognition 02/26/2020   ESRD (end stage renal disease) ([place]) 02/18/2020   Dyspnea 02/18/2020   Encounter for immunization 11/19/2019   Allergy, unspecified, initial encounter 09/27/2019   Iron deficiency anemia, unspecified 09/27/2019   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   Unspecified protein-calorie malnutrition ([place]) 09/27/2019   Diarrhea, unspecified 09/26/2019   Pain, unspecified 09/26/2019   Hyperkalemia 09/23/2019   CKD (chronic kidney disease), stage V ([place]) 09/23/2019   Acute kidney insufficiency 09/23/2019   Pruritus 04/15/2019   Senile purpura ([place]) 04/15/2019   Abdominal aortic aneurysm (AAA) ([place]) 01/21/2019   AAA (abdominal aortic aneurysm) without rupture ([place]) 01/21/2019   Trochanteric bursitis of left hip 12/27/2018   Cirrhosis of liver ([place]) 05/29/2017   Ascites 05/20/2017   Normochromic normocytic anemia 05/20/2017   Essential hypertension 05/20/2017   CKD (chronic kidney disease) stage 3, GFR 30-59 ml/min (HCC) 05/20/2017   HLD (hyperlipidemia) 05/20/2017   Hypothyroidism 05/20/2017   Aortic valve sclerosis 02/23/2017   Coronary artery disease involving native coronary artery of native heart without angina pectoris 02/23/2017   Atherosclerosis of abdominal aorta ([place]) 12/02/2016   Left hip pain 06/13/2016   Senile nuclear sclerosis 04/05/2016   Bilateral chronic knee pain 03/11/2016   Thrombocytopenia ([place]) 11/21/2015   H/O peptic ulcer 05/13/2015   History of prostate cancer 05/13/2015   Vitamin B12 deficiency 05/13/2015   Benign neoplasm of colon 04/01/2015    Degeneration of lumbar intervertebral disc 04/01/2015   Erectile dysfunction 04/01/2015   Gouty arthropathy 04/01/2015   Osteoarthritis 04/01/2015   Prediabetes 04/01/2015   Renal artery stenosis ([place]) 04/01/2015   PCP:  [name]., MD Pharmacy:   [facility] [address] [place] [address] Phone: [phone] Fax: [phone]    Readmission Risk Interventions Readmission Risk Prevention Plan 09/22/2020 02/20/2020 09/26/2019  Transportation Screening Complete Complete Complete  PCP or Specialist Appt within 5-7 Days - - -  PCP or Specialist Appt within 3-5 Days - Complete Complete  Home Care Screening - - -  Medication Review (RN CM) - - -  [place] or Home Care Consult - Complete Complete  Social Work Consult for Recovery Care Planning/Counseling - Complete Complete  Palliative Care Screening - Not Applicable Not Applicable  Medication Review Press photographer) Complete Complete Complete  PCP or Specialist appointment within 3-5 days of discharge Complete - -  [place] or Home Care Consult Complete - -  SW Recovery Care/Counseling Consult Complete - -  Palliative Care Screening Complete - -  [place] Not Applicable - -  Some recent data might be hidden

## 2020-09-23 ENCOUNTER — Ambulatory Visit (HOSPITAL_COMMUNITY): Payer: Medicare Other

## 2020-09-23 ENCOUNTER — Ambulatory Visit: Payer: Medicare Other | Admitting: Vascular Surgery

## 2020-09-23 LAB — CBC
HCT: 30.6 % — ABNORMAL LOW (ref 39.0–52.0)
Hemoglobin: 9.5 g/dL — ABNORMAL LOW (ref 13.0–17.0)
MCH: 32.6 pg (ref 26.0–34.0)
MCHC: 31 g/dL (ref 30.0–36.0)
MCV: 105.2 fL — ABNORMAL HIGH (ref 80.0–100.0)
Platelets: 129 10*3/uL — ABNORMAL LOW (ref 150–400)
RBC: 2.91 MIL/uL — ABNORMAL LOW (ref 4.22–5.81)
RDW: 16 % — ABNORMAL HIGH (ref 11.5–15.5)
WBC: 5.1 10*3/uL (ref 4.0–10.5)
nRBC: 0 % (ref 0.0–0.2)

## 2020-09-23 LAB — BASIC METABOLIC PANEL
Anion gap: 9 (ref 5–15)
BUN: 23 mg/dL (ref 8–23)
CO2: 31 mmol/L (ref 22–32)
Calcium: 7.7 mg/dL — ABNORMAL LOW (ref 8.9–10.3)
Chloride: 98 mmol/L (ref 98–111)
Creatinine, Ser: 5.23 mg/dL — ABNORMAL HIGH (ref 0.61–1.24)
GFR, Estimated: 11 mL/min — ABNORMAL LOW (ref >60)
Glucose, Bld: 107 mg/dL — ABNORMAL HIGH (ref 70–99)
Potassium: 3.8 mmol/L (ref 3.5–5.1)
Sodium: 138 mmol/L (ref 135–145)

## 2020-09-23 MED ORDER — ISOSORBIDE MONONITRATE ER 30 MG PO TB24: 15.0000 mg | ORAL_TABLET | Freq: Every day | ORAL | 0 refills | Status: DC

## 2020-09-23 MED ORDER — APIXABAN 5 MG PO TABS: 5.0000 mg | ORAL_TABLET | Freq: Two times a day (BID) | ORAL | 0 refills | Status: DC

## 2020-09-23 MED ORDER — ASPIRIN 81 MG PO TBEC: 81.0000 mg | DELAYED_RELEASE_TABLET | Freq: Every day | ORAL | 0 refills | Status: AC

## 2020-09-23 MED ORDER — SPIRONOLACTONE 25 MG PO TABS: 12.5000 mg | ORAL_TABLET | Freq: Every day | ORAL | 0 refills | Status: DC

## 2020-09-23 MED ORDER — ATORVASTATIN CALCIUM 80 MG PO TABS: 80.0000 mg | ORAL_TABLET | Freq: Every day | ORAL | 0 refills | Status: DC

## 2020-09-23 MED ORDER — METOPROLOL SUCCINATE ER 25 MG PO TB24: 25.0000 mg | ORAL_TABLET | Freq: Every day | ORAL | 0 refills | Status: DC

## 2020-09-23 NOTE — Discharge Summary (Signed)
Physician Discharge Summary  Ricky Harris VEH:[id number] DOB: 02/17/1941 DOA: 09/15/2020  PCP: [name]., MD  Admit date: 09/15/2020 Discharge date: 09/23/2020  Admitted From: home Disposition: Home with hospice  Recommendations for Outpatient Follow-up:  Follow up with PCP in 1-2 weeks Please obtain BMP/CBC in one week Please follow up on the following pending results:  Home Health:no  Equipment/Devices: none  Discharge Condition: Stable Code Status:   Code Status: DNR Diet recommendation:  Diet Order             Diet - low sodium heart healthy           Diet Heart Room service appropriate? Yes; Fluid consistency: Thin  Diet effective now                    Brief/Interim Summary: 80 year old male with ESRD on HD, liver cirrhosis, HTN, hypothyroidism, AAA with stent graft, CAD seen in the CT scan.  From dialysis center for evaluation of right-sided chest pain. He was seen in the ED EKG showed sinus rhythm chest x-ray with left more than right pleural effusion, CTA chest abdomen pelvis negative for PE BNP more than 4500, showed new moderate pleural effusion nephro was consulted cardio was consulted and patient was admitted. Patient underwent cardiac cath-severe coronary calcification involving the LAD and right coronary artery with mild calcification of the left circumflex vessel, severe multivessel CAD. Cardiovascular surgery was consulted there was discussion on CABG revascularization versus arthrectomy.  Subsequently due to high risk and with poor prognosis turndown for surgery by cardiovascular team.  Cardiology team advised palliative care evaluation.  Seen by palliative care patient was to continue with current dialysis and will go home with hospice outpatient palliative care follow-up.  At this time he appears chest pain-free doing well and stable for discharge.  Discharge Diagnoses:  Acute on chronic combined systolic and diastolic CHF: Bilateral pleural  effusions Continue to address fluid management with dialysis as outpatient. EF decreased to 25-30% with multivessel CAD. Seen by cardiology and following closely. Monitor intake output Daily weight as below Net IO Since Admission: -3,832.54 mL [09/23/20 1155]  Filed Weights   09/22/20 0820 09/22/20 1145 09/23/20 0528  Weight: 75.2 kg 73.7 kg 71.8 kg    Ischemic cardiomyopathy Slightly positive troponin Multivessel coronary artery disease seen in the cardiac cath-with high-grade LAD and right coronary stenosis Turndown for surgery by cardiac thoracic team.advised outpatient palliative care follow-up/hospice and will continue his home regimen with  aspirin, Imdur, Lipitor and metoprolol.  He will be going home on Eliquis   New onset A. fib after dialysis on 7/21.spontaneously converted, continue Toprol , and Eliquis.    Dyslipidemia cont continue statin-high intensity  ESRD on HD TTS, managed by nephrology on board-dialysis today 7/26.  Metabolic bone disease calcium/phosphorus stable continue home binders/VDRF  Liver cirrhosis:Compensated  History of AAA underwent CTA with patent aortic stent graft with 6.1 cm native sac diameter up from 5.9 need outpatient close follow-up  Possible Community-acquired pneumonia chest x-ray suspicious for bilateral left greater than right airspace disease.He is afebrile, no leukocytosis procalcitonin was 0.6.  Patient completed antibiotics.  At this time stable now on  Thrombocytopenia monitor closely, improving. Now on Eliquis Recent Labs  Lab 09/19/20 0358 09/20/20 0505 09/21/20 0254 09/22/20 0056 09/23/20 0142  PLT 120* 111* 123* 122* 129*   Anemia of chronic disease hemoglobin stable Recent Labs  Lab 09/19/20 0358 09/20/20 0505 09/21/20 0254 09/22/20 0056 09/23/20 0142  HGB 10.4*  9.5* 9.7* 9.6* 9.5*  HCT 32.1* 29.6* 30.3* 30.5* 30.6*  Moderate protein calorie malnutrition augment diet as tolerated.  Goals of care: Overall poor  prognosis given his multivessel coronary artery disease.Marland Kitchen PMT consult  Consults: Cardiology, cardiothoracic surgery, nephrology and palliative medicine  Subjective: Alert awake, no chest pain, resting comfortably, agreeable for going home with hospice today Discharge Exam: Vitals:   09/23/20 0830 09/23/20 1024  BP: 116/64 110/63  Pulse: (!) 51   Resp: 20   Temp: 98.5 F (36.9 C)   SpO2: 95%    General: Pt is alert, awake, not in acute distress Cardiovascular: RRR, S1/S2 +, no rubs, no gallops Respiratory: CTA bilaterally, no wheezing, no rhonchi Abdominal: Soft, NT, ND, bowel sounds + Extremities: no edema, no cyanosis  Discharge Instructions  Discharge Instructions     Ambulatory referral to Physical Therapy   Complete by: As directed    Diet - low sodium heart healthy   Complete by: As directed    Discharge instructions   Complete by: As directed    Follow up with palliative service  Please call call MD or return to ER for similar or worsening recurring problem that brought you to hospital or if any fever,nausea/vomiting,abdominal pain, uncontrolled pain, chest pain,  shortness of breath or any other alarming symptoms.  Please follow-up your doctor as instructed in a week time and call the office for appointment.  Please avoid alcohol, smoking, or any other illicit substance and maintain healthy habits including taking your regular medications as prescribed.  You were cared for by a hospitalist during your hospital stay. If you have any questions about your discharge medications or the care you received while you were in the hospital after you are discharged, you can call the unit and ask to speak with the hospitalist on call if the hospitalist that took care of you is not available.  Once you are discharged, your primary care physician will handle any further medical issues. Please note that NO REFILLS for any discharge medications will be authorized once you are  discharged, as it is imperative that you return to your primary care physician (or establish a relationship with a primary care physician if you do not have one) for your aftercare needs so that they can reassess your need for medications and monitor your lab values   Increase activity slowly   Complete by: As directed    No wound care   Complete by: As directed       Allergies as of 09/23/2020       Reactions   Cephalexin Hives   Hydralazine Hives        Medication List     STOP taking these medications    amLODipine 5 MG tablet Commonly known as: NORVASC   calcitRIOL 0.25 MCG capsule Commonly known as: ROCALTROL   carvedilol 12.5 MG tablet Commonly known as: COREG   cloNIDine 0.1 MG tablet Commonly known as: CATAPRES   pantoprazole 40 MG tablet Commonly known as: Protonix       TAKE these medications    apixaban 5 MG Tabs tablet Commonly known as: ELIQUIS Take 1 tablet (5 mg total) by mouth 2 (two) times daily.   aspirin 81 MG EC tablet Take 1 tablet (81 mg total) by mouth daily. Swallow whole. Start taking on: September 24, 2020   atorvastatin 80 MG tablet Commonly known as: LIPITOR Take 1 tablet (80 mg total) by mouth at bedtime. What changed:  medication strength how  much to take when to take this   calcium acetate 667 MG capsule Commonly known as: PHOSLO Take 1 capsule (667 mg total) by mouth 3 (three) times daily with meals.   febuxostat 40 MG tablet Commonly known as: ULORIC Take 1 tablet (40 mg total) by mouth daily.   isosorbide mononitrate 30 MG 24 hr tablet Commonly known as: IMDUR Take 0.5 tablets (15 mg total) by mouth daily. Start taking on: September 24, 2020   lactulose 10 GM/15ML solution Commonly known as: CHRONULAC Take 15 mLs (10 g total) by mouth daily.   metoprolol succinate 25 MG 24 hr tablet Commonly known as: TOPROL-XL Take 1 tablet (25 mg total) by mouth daily. Start taking on: September 24, 2020   multivitamin Tabs  tablet Take 1 tablet by mouth daily.   spironolactone 25 MG tablet Commonly known as: ALDACTONE Take 0.5 tablets (12.5 mg total) by mouth daily. Start taking on: September 24, 2020   traMADol 50 MG tablet Commonly known as: ULTRAM Take 1 tablet (50 mg total) by mouth every 8 (eight) hours as needed for moderate pain.        Follow-up Information     [name]., MD Follow up in 1 week(s).   Specialty: Internal Medicine Contact information: [place] [address] [phone]         [facility] Follow up.   Specialty: PALLIATIVE CARE Why: Palliative Services to follow outpatient-office to call with a visit time. Contact information: [place] [address] [phone]        [name]., MD Follow up in 1 week(s).   Specialty: Internal Medicine Contact information: [place] [address] [phone]                Allergies  Allergen Reactions   Cephalexin Hives   Hydralazine Hives    The results of significant diagnostics from this hospitalization (including imaging, microbiology, ancillary and laboratory) are listed below for reference.    Microbiology: Recent Results (from the past 240 hour(s))  Resp Panel by RT-PCR (Flu A&B, Covid) Nasopharyngeal Swab     Status: None   Collection Time: 09/15/20  4:14 PM   Specimen: Nasopharyngeal Swab; Nasopharyngeal(NP) swabs in vial transport medium  Result Value Ref Range Status   SARS Coronavirus 2 by RT PCR NEGATIVE NEGATIVE Final    Comment: (NOTE) SARS-CoV-2 target nucleic acids are NOT DETECTED.  The SARS-CoV-2 RNA is generally detectable in upper respiratory specimens during the acute phase of infection. The lowest concentration of SARS-CoV-2 viral copies this assay can detect is 138 copies/mL. A negative result does not preclude SARS-Cov-2 infection and should not be used as the sole basis for treatment or other patient  management decisions. A negative result may occur with  improper specimen collection/handling, submission of specimen other than nasopharyngeal swab, presence of viral mutation(s) within the areas targeted by this assay, and inadequate number of viral copies(<138 copies/mL). A negative result must be combined with clinical observations, patient history, and epidemiological information. The expected result is Negative.  Fact Sheet for Patients:  EntrepreneurPulse.com.au  Fact Sheet for Healthcare Providers:  IncredibleEmployment.be  This test is no t yet approved or cleared by the [place] FDA and  has been authorized for detection and/or diagnosis of SARS-CoV-2 by FDA under an Emergency Use Authorization (EUA). This EUA will remain  in effect (meaning this test can be used) for the duration of the COVID-19 declaration  under Section 564(b)(1) of the Act, 21 U.S.C.section 360bbb-3(b)(1), unless the authorization is terminated  or revoked sooner.       Influenza A by PCR NEGATIVE NEGATIVE Final   Influenza B by PCR NEGATIVE NEGATIVE Final    Comment: (NOTE) The Xpert Xpress SARS-CoV-2/FLU/RSV plus assay is intended as an aid in the diagnosis of influenza from Nasopharyngeal swab specimens and should not be used as a sole basis for treatment. Nasal washings and aspirates are unacceptable for Xpert Xpress SARS-CoV-2/FLU/RSV testing.  Fact Sheet for Patients: EntrepreneurPulse.com.au  Fact Sheet for Healthcare Providers: IncredibleEmployment.be  This test is not yet approved or cleared by the [place] FDA and has been authorized for detection and/or diagnosis of SARS-CoV-2 by FDA under an Emergency Use Authorization (EUA). This EUA will remain in effect (meaning this test can be used) for the duration of the COVID-19 declaration under Section 564(b)(1) of the Act, 21 U.S.C. section 360bbb-3(b)(1),  unless the authorization is terminated or revoked.  Performed at [facility], [place] [address], [place], [place] [address]     Procedures/Studies: CARDIAC CATHETERIZATION  Result Date: 09/18/2020 Formatting of this result is different from the original.   Prox LAD to Mid LAD lesion is 85% stenosed.   Mid LAD lesion is 50% stenosed.   Dist LAD lesion is 20% stenosed.   Prox Cx to Mid Cx lesion is 65% stenosed.   Prox RCA lesion is 95% stenosed.   Mid RCA lesion is 90% stenosed.   LV end diastolic pressure is mildly elevated.   Hemodynamic findings consistent with mild pulmonary hypertension. There is severe coronary calcification involving the LAD and right coronary artery with mild calcification of the left circumflex vessel. 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 Mild right heart pressure elevation with mild pulmonary hypertension. RECOMMENDATION: The patient has severe coronary calcification and three-vessel disease.  Echo Doppler has demonstrated reduced EF in the 25 to 30% range.  Recommend initial surgical consultation for consideration of possible CABG revascularization surgery.  However with the patient's significant comorbidities, if surgery is excluded, will need to review with colleagues regarding potential orbital atherectomy and intervention into the LAD and RCA.  We will consider reinstitution of low-dose heparin much later today and this patient with recent PAF and ulcerated plaque in the proximal RCA.   DG Chest Port 1 View  Result Date: 09/15/2020 CLINICAL DATA:  Onset right chest pain during dialysis today. EXAM: PORTABLE  CHEST 1 VIEW COMPARISON:  PA and lateral chest 04/14/2020. FINDINGS: There is bilateral airspace disease and left greater than right pleural effusions. Cardiomegaly. Aortic atherosclerosis. No pneumothorax. No acute or focal bony abnormality. IMPRESSION: Left greater than right pleural effusions and airspace disease have an appearance most compatible with congestive failure but could reflect pneumonia. Cardiomegaly. Aortic Atherosclerosis (ICD10-I70.0). Electronically Signed   By: Inge Rise M.D.   On: 09/15/2020 16:03   ECHOCARDIOGRAM COMPLETE  Result Date: 09/16/2020    ECHOCARDIOGRAM REPORT   Patient Name:   Ricky Harris Date of Exam: 09/16/2020 Medical Rec #:  [id number]      Height:       72.0 in Accession #:    [id number]  Weight:       169.8 lb Date of Birth:  Jul 28, 1940     BSA:          1.987 m Patient Age:    80 years       BP:           118/59 mmHg Patient Gender: M              HR:           96 bpm. Exam Location:  Inpatient Procedure: 2D Echo Indications:    acute diastolic chf  History:        Patient has prior history of Echocardiogram examinations, most                 recent 09/24/2019. Abdominal aortic aneurysm, Chronic kidney                 disease. Cirrhosis, Signs/Symptoms:elevated troponin; Risk                 Factors:Hypertension.  Sonographer:    Johny Chess Referring Phys: [id number] Pope  1. Left ventricular ejection fraction, by estimation, is 25 to 30%. The left ventricle has severely decreased function. The left ventricle demonstrates global hypokinesis. Left ventricular diastolic parameters are consistent with Grade I diastolic dysfunction (impaired relaxation).  2. Right ventricular systolic function is mildly reduced. The right ventricular size is normal. There is normal pulmonary artery systolic pressure. The estimated right ventricular systolic pressure is 51.0 mmHg.  3. Left atrial size was moderately dilated.  4. Right atrial size was mildly  dilated.  5. The mitral valve is normal in structure. Trivial mitral valve regurgitation. No evidence of mitral stenosis. Moderate mitral annular calcification.  6. The aortic valve is tricuspid. Aortic valve regurgitation is not visualized. Mild to moderate aortic valve sclerosis/calcification is present, without any evidence of aortic stenosis.  7. Aortic dilatation noted. There is mild dilatation of the ascending aorta, measuring 40 mm.  8. The inferior vena cava is dilated in size with >50% respiratory variability, suggesting right atrial pressure of 8 mmHg. FINDINGS  Left Ventricle: Left ventricular ejection fraction, by estimation, is 25 to 30%. The left ventricle has severely decreased function. The left ventricle demonstrates global hypokinesis. The left ventricular internal cavity size was normal in size. There is no left ventricular hypertrophy. Left ventricular diastolic parameters are consistent with Grade I diastolic dysfunction (impaired relaxation). Right Ventricle: The right ventricular size is normal. No increase in right ventricular wall thickness. Right ventricular systolic function is mildly reduced. There is normal pulmonary artery systolic pressure. The tricuspid regurgitant velocity is 1.61 m/s, and with an assumed right atrial pressure of 8 mmHg, the estimated right ventricular systolic pressure is 25.8 mmHg. Left Atrium: Left atrial size was moderately dilated. Right Atrium: Right atrial size was mildly dilated. Pericardium: There is no evidence of pericardial effusion. Mitral Valve: The mitral valve is normal in structure. Moderate mitral annular calcification. Trivial mitral valve regurgitation. No evidence of mitral valve stenosis. Tricuspid Valve: The tricuspid valve is normal in structure. Tricuspid valve regurgitation is trivial. Aortic Valve: The aortic valve is tricuspid. Aortic valve regurgitation is not visualized. Mild to moderate aortic valve sclerosis/calcification is present,  without any evidence of aortic stenosis. Pulmonic Valve: The pulmonic valve was normal in structure. Pulmonic valve regurgitation is not visualized. Aorta: The aortic root is normal in size and structure and aortic dilatation noted. There is mild dilatation of the ascending aorta,  measuring 40 mm. Venous: The inferior vena cava is dilated in size with greater than 50% respiratory variability, suggesting right atrial pressure of 8 mmHg. IAS/Shunts: No atrial level shunt detected by color flow Doppler.  LEFT VENTRICLE PLAX 2D LVIDd:         5.60 cm      Diastology LVIDs:         4.40 cm      LV e' medial:    5.00 cm/s LV PW:         1.00 cm      LV E/e' medial:  19.5 LV IVS:        0.90 cm      LV e' lateral:   10.10 cm/s LVOT diam:     2.20 cm      LV E/e' lateral: 9.7 LV SV:         92 LV SV Index:   46 LVOT Area:     3.80 cm  LV Volumes (MOD) LV vol d, MOD A2C: 154.0 ml LV vol d, MOD A4C: 199.0 ml LV vol s, MOD A2C: 107.0 ml LV vol s, MOD A4C: 137.0 ml LV SV MOD A2C:     47.0 ml LV SV MOD A4C:     199.0 ml LV SV MOD BP:      60.9 ml RIGHT VENTRICLE             IVC RV S prime:     13.30 cm/s  IVC diam: 2.40 cm TAPSE (M-mode): 2.4 cm LEFT ATRIUM              Index       RIGHT ATRIUM           Index LA diam:        5.10 cm  2.57 cm/m  RA Area:     21.90 cm LA Vol (A2C):   101.0 ml 50.83 ml/m RA Volume:   66.60 ml  33.52 ml/m LA Vol (A4C):   93.2 ml  46.90 ml/m LA Biplane Vol: 97.0 ml  48.81 ml/m  AORTIC VALVE LVOT Vmax:   125.00 cm/s LVOT Vmean:  85.200 cm/s LVOT VTI:    0.241 m  AORTA Ao Root diam: 3.50 cm Ao Asc diam:  4.00 cm MITRAL VALVE                TRICUSPID VALVE MV Area (PHT): 4.96 cm     TR Peak grad:   10.4 mmHg MV Decel Time: 153 msec     TR Vmax:        161.00 cm/s MV E velocity: 97.50 cm/s MV A velocity: 112.00 cm/s  SHUNTS MV E/A ratio:  0.87         Systemic VTI:  0.24 m                             Systemic Diam: 2.20 cm Loralie Champagne MD Electronically signed by Loralie Champagne MD Signature  Date/Time: 09/16/2020/5:29:59 PM    Final    CT Angio Chest/Abd/Pel for Dissection W and/or W/WO  Result Date: 09/15/2020 CLINICAL DATA:  Abdominal pain, suspected aortic dissection EXAM: CT ANGIOGRAPHY CHEST, ABDOMEN AND PELVIS TECHNIQUE: Multidetector CT imaging through the chest, abdomen and pelvis was performed using the standard protocol during bolus administration of intravenous contrast. Multiplanar reconstructed images and MIPs were obtained and reviewed to evaluate the vascular anatomy. CONTRAST:  126mL OMNIPAQUE IOHEXOL 350 MG/ML  SOLN COMPARISON:  CT 04/14/2020 and previous FINDINGS: CTA CHEST FINDINGS Cardiovascular: 4-chamber mild cardiomegaly has developed. No pericardial effusion. Satisfactory opacification of pulmonary arteries noted, and there is no evidence of pulmonary emboli. Coarse coronary calcifications. Good contrast opacification of the thoracic aorta, without dissection or aneurysm. Moderate calcified atheromatous plaque in the arch and descending thoracic segment. 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 Aortic Root: --Valve: 2.7 cm with coarse calcifications on the leaflets --Sinuses: 3.6 cm --Sinotubular Junction: 3.1 cm Limitations by motion: Moderate Thoracic Aorta: --Ascending Aorta: 4.1   cm --Aortic Arch: 4 cm --Descending Aorta: 3.3 cm Mediastinum/Nodes: No mass or adenopathy. Lungs/Pleura: Moderate pleural effusions have developed. There are extensive opacities throughout the left lower lobe, and dependent atelectasis/consolidation in the right lower lobe. No pneumothorax. Musculoskeletal: Anterior vertebral endplate spurring at multiple levels in the lower thoracic spine. Left shoulder DJD. 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 Review of the MIP images confirms the above findings. CTA ABDOMEN AND PELVIS FINDINGS VASCULAR Aorta: Patent infrarenal bifurcated aortic stent graft as before, with mild narrowing of the  left limb, stable. Native sac diameter 6.1 x 6 cm maximum transverse dimensions (previously 5.9 by my measurement). Limited assessment for endoleak on this single phase study. Celiac: Patent.  Splenic artery diffusely calcified. SMA: Patent without evidence of aneurysm, dissection, vasculitis or significant stenosis. Renals: Single bilaterally, both with heavily calcified ostial plaque resulting in short segment stenosis of at least moderate severity, patent distally. IMA: Proximal occlusion, reconstituted distally by visceral collaterals. Inflow: Right limb of the stent graft extends to the distal common iliac, well apposed. Left limb extends to distal common iliac, well apposed. Calcified plaque in the native internal iliac arteries without aneurysm. Native external iliac arteries are mildly tortuous, patent. Veins: No obvious venous abnormality within the limitations of this arterial phase study. Review of the MIP images confirms the above findings. NON-VASCULAR Hepatobiliary: No focal liver abnormality is seen. Status post cholecystectomy. No biliary dilatation. Pancreas: Unremarkable. No pancreatic ductal dilatation or surrounding inflammatory changes. Spleen: Normal in size without focal abnormality. Adrenals/Urinary Tract: Adrenal glands unremarkable. Bilateral renal parenchymal atrophy. No hydronephrosis. 2.5 cm exophytic probable cyst from the mid right kidney, stable. Urinary bladder is incompletely distended. Stomach/Bowel: Stomach is partially distended, unremarkable. The small bowel is decompressed. Normal appendix. Innumerable descending and sigmoid diverticula. Lymphatic: No abdominal or pelvic adenopathy. Reproductive: Penile pump components stable position. Previous prostatectomy. Other: Small volume abdominal ascites, new since previous. No free air. Musculoskeletal: Spondylitic changes in the lower lumbar spine. Left hip DJD. No displaced fracture or worrisome bone lesion. Review of the MIP images  confirms the above findings. IMPRESSION: 1. Negative for acute PE or thoracic aortic dissection. 2. 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 Recommend annual imaging followup by CTA or MRA. This recommendation follows 2010 ACCF/AHA/AATS/ACR/ASA/SCA/SCAI/SIR/STS/SVM Guidelines for the Diagnosis and Management of Patients with Thoracic Aortic Disease. Circulation. 2010; 121: [id number]-W967. 3. New moderate pleural effusions with bilateral pulmonary opacities. 4. Small volume  Abdominal ascites. 5. Patent aortic stent graft with 6.1 cm native sac diameter (previously 5.9). 6. Descending and sigmoid diverticulosis Electronically Signed   By: Lucrezia Europe M.D.   On: 09/15/2020 17:32    Labs: BNP (last 3 results) Recent Labs    09/15/20 1618  BNP >5,916.3*   Basic Metabolic Panel: Recent Labs  Lab 09/18/20 0107 09/18/20 1706 09/19/20 0358 09/20/20 0505 09/21/20 0254 09/22/20 0056 09/23/20 0142  NA 138   < >  134* 135 132* 135 138  K 3.3*   < > 3.9 3.5 3.5 3.8 3.8  CL 99  --  99 98 96* 100 98  CO2 28  --  23 26 25 22 31   GLUCOSE 141*  --  91 105* 104* 101* 107*  BUN 12  --  23 13 21  28* 23  CREATININE 3.54*  --  5.34* 4.06* 5.45* 6.68* 5.23*  CALCIUM 8.2*  --  8.0* 8.5* 7.8* 7.8* 7.7*  MG 1.8  --   --   --  1.9  --   --   PHOS  --   --   --   --  3.3  --   --    < > = values in this interval not displayed.   Liver Function Tests: Recent Labs  Lab 09/21/20 0254  ALBUMIN 2.7*   No results for input(s): LIPASE, AMYLASE in the last 168 hours. No results for input(s): AMMONIA in the last 168 hours. CBC: Recent Labs  Lab 09/19/20 0358 09/20/20 0505 09/21/20 0254 09/22/20 0056 09/23/20 0142  WBC 6.1 5.3 5.9 7.4 5.1  HGB 10.4* 9.5* 9.7* 9.6* 9.5*  HCT 32.1* 29.6* 30.3* 30.5* 30.6*  MCV 104.6* 102.8* 103.1* 105.2* 105.2*  PLT 120* 111* 123* 122* 129*   Cardiac Enzymes: No results for input(s): CKTOTAL, CKMB, CKMBINDEX, TROPONINI in the last 168 hours. BNP: Invalid input(s):  POCBNP CBG: No results for input(s): GLUCAP in the last 168 hours. D-Dimer No results for input(s): DDIMER in the last 72 hours. Hgb A1c No results for input(s): HGBA1C in the last 72 hours. Lipid Profile No results for input(s): CHOL, HDL, LDLCALC, TRIG, CHOLHDL, LDLDIRECT in the last 72 hours. Thyroid function studies No results for input(s): TSH, T4TOTAL, T3FREE, THYROIDAB in the last 72 hours.  Invalid input(s): FREET3 Anemia work up No results for input(s): VITAMINB12, FOLATE, FERRITIN, TIBC, IRON, RETICCTPCT in the last 72 hours. Urinalysis    Component Value Date/Time   COLORURINE YELLOW 01/18/2019 1230   APPEARANCEUR CLEAR 01/18/2019 1230   LABSPEC 1.017 01/18/2019 1230   PHURINE 5.0 01/18/2019 1230   GLUCOSEU NEGATIVE 01/18/2019 1230   HGBUR NEGATIVE 01/18/2019 1230   BILIRUBINUR NEGATIVE 01/18/2019 1230   KETONESUR NEGATIVE 01/18/2019 1230   PROTEINUR 100 (A) 01/18/2019 1230   NITRITE NEGATIVE 01/18/2019 1230   LEUKOCYTESUR NEGATIVE 01/18/2019 1230   Sepsis Labs Invalid input(s): PROCALCITONIN,  WBC,  LACTICIDVEN Microbiology Recent Results (from the past 240 hour(s))  Resp Panel by RT-PCR (Flu A&B, Covid) Nasopharyngeal Swab     Status: None   Collection Time: 09/15/20  4:14 PM   Specimen: Nasopharyngeal Swab; Nasopharyngeal(NP) swabs in vial transport medium  Result Value Ref Range Status   SARS Coronavirus 2 by RT PCR NEGATIVE NEGATIVE Final    Comment: (NOTE) SARS-CoV-2 target nucleic acids are NOT DETECTED.  The SARS-CoV-2 RNA is generally detectable in upper respiratory specimens during the acute phase of infection. The lowest concentration of SARS-CoV-2 viral copies this assay can detect is 138 copies/mL. A negative result does not preclude SARS-Cov-2 infection and should not be used as the sole basis for treatment or other patient management decisions. A negative result may occur with  improper specimen collection/handling, submission of specimen  other than nasopharyngeal swab, presence of viral mutation(s) within the areas targeted by this assay, and inadequate number of viral copies(<138 copies/mL). A negative result must be combined with clinical observations, patient history, and epidemiological information. The expected result is Negative.  Fact Sheet for Patients:  EntrepreneurPulse.com.au  Fact Sheet for Healthcare Providers:  IncredibleEmployment.be  This test is no t yet approved or cleared by the [place] FDA and  has been authorized for detection and/or diagnosis of SARS-CoV-2 by FDA under an Emergency Use Authorization (EUA). This EUA will remain  in effect (meaning this test can be used) for the duration of the COVID-19 declaration under Section 564(b)(1) of the Act, 21 U.S.C.section 360bbb-3(b)(1), unless the authorization is terminated  or revoked sooner.       Influenza A by PCR NEGATIVE NEGATIVE Final   Influenza B by PCR NEGATIVE NEGATIVE Final    Comment: (NOTE) The Xpert Xpress SARS-CoV-2/FLU/RSV plus assay is intended as an aid in the diagnosis of influenza from Nasopharyngeal swab specimens and should not be used as a sole basis for treatment. Nasal washings and aspirates are unacceptable for Xpert Xpress SARS-CoV-2/FLU/RSV testing.  Fact Sheet for Patients: EntrepreneurPulse.com.au  Fact Sheet for Healthcare Providers: IncredibleEmployment.be  This test is not yet approved or cleared by the [place] FDA and has been authorized for detection and/or diagnosis of SARS-CoV-2 by FDA under an Emergency Use Authorization (EUA). This EUA will remain in effect (meaning this test can be used) for the duration of the COVID-19 declaration under Section 564(b)(1) of the Act, 21 U.S.C. section 360bbb-3(b)(1), unless the authorization is terminated or revoked.  Performed at [facility], [place] [address] [place],  [place] [address]      Time coordinating discharge: 35 minutes  SIGNED: Antonieta Pert, MD  [facility] 09/23/2020, 11:55 AM  If 7PM-7AM, please contact night-coverage [email or web address]

## 2020-09-23 NOTE — Progress Notes (Signed)
Physical Therapy Treatment Patient Details Name: Ricky Harris MRN: [id number] DOB: Jun 19, 1940 Today's Date: 09/23/2020    History of Present Illness Pt is a 80 y.o. male admitted from HD center on 09/15/20 with R-side chest pain. CXR with bilateral pleural effusion (L>R); concern for PNA. Workup for acute on chronic CHF, pleural effusions, new onset afib. S/p LHC on 7/22 which showed multivessel CAD; deemed not a surgical candidate, plan for medical management. PMH includes HTN, CAD, ESRD (HD TTS), ischemic cardiomyopathy, metabolic bone disease, anemia, prostate CA, gout, arthritis.   PT Comments    Pt progressing well with mobility. Pt performing multiple standing ADL tasks and ambulation throughout room without DME, supervision for safety; noted improvements in activity tolerance and stability compared to yesterday's session post-HD. Pt's sons present and supportive; educ pt and family re: DME needs, fall risk reduction, activity recommendations. Pt reports no further questions or concerns.    Follow Up Recommendations  Home health PT;Supervision for mobility/OOB (pt declined PT)     Equipment Recommendations  None recommended by PT    Recommendations for Other Services       Precautions / Restrictions Precautions Precautions: Fall Restrictions Weight Bearing Restrictions: No    Mobility  Bed Mobility Overal bed mobility: Independent                  Transfers Overall transfer level: Needs assistance Equipment used: None Transfers: Sit to/from Stand Sit to Stand: Supervision         General transfer comment: standing from EOB and toilet  Ambulation/Gait Ambulation/Gait assistance: Supervision Gait Distance (Feet): 34 Feet Assistive device: None Gait Pattern/deviations: Step-through pattern;Decreased stride length Gait velocity: Decreased   General Gait Details: Ambulatory in room without DME, improved stability compared to prior session; educ on use of  RW for longer distances (stability and energy conservation)   Stairs Stairs:  (son reports providing guarding on ascend/descending steps into home)           Wheelchair Mobility    Modified Rankin (Stroke Patients Only)       Balance Overall balance assessment: Needs assistance Sitting-balance support: No upper extremity supported;Feet supported Sitting balance-Leahy Scale: Good       Standing balance-Leahy Scale: Fair Standing balance comment: able to don clothes without UE support                            Cognition Arousal/Alertness: Awake/alert Behavior During Therapy: WFL for tasks assessed/performed Overall Cognitive Status: Within Functional Limits for tasks assessed                                 General Comments: Improved affect this session, seems less fatigued, excited to d/c home      Exercises      General Comments        Pertinent Vitals/Pain Pain Assessment: No/denies pain Pain Intervention(s): Monitored during session    Home Living                      Prior Function            PT Goals (current goals can now be found in the care plan section) Progress towards PT goals: Progressing toward goals    Frequency    Min 3X/week      PT Plan Current plan remains appropriate    Co-evaluation  AM-PAC PT "6 Clicks" Mobility   Outcome Measure  Help needed turning from your back to your side while in a flat bed without using bedrails?: None Help needed moving from lying on your back to sitting on the side of a flat bed without using bedrails?: None Help needed moving to and from a bed to a chair (including a wheelchair)?: A Little Help needed standing up from a chair using your arms (e.g., wheelchair or bedside chair)?: A Little Help needed to walk in hospital room?: A Little Help needed climbing 3-5 steps with a railing? : A Little 6 Click Score: 20    End of Session    Activity Tolerance: Patient tolerated treatment well Patient left: in bed;with call bell/phone within reach;with family/visitor present Nurse Communication: Mobility status PT Visit Diagnosis: Muscle weakness (generalized) (M62.81);Pain     Time: 9357-0177 PT Time Calculation (min) (ACUTE ONLY): 22 min  Charges:  $Therapeutic Activity: 8-22 mins                    Mabeline Caras, PT, DPT Acute Rehabilitation Services  Pager [phone] Office [place] 09/23/2020, 11:01 AM

## 2020-09-23 NOTE — Progress Notes (Signed)
[facility] Progress Note   Subjective:   Patient seen and examined at bedside with family present.  Reports he is feeling well.  To go home today.  Denies CP, SOB, n/v/d, abdominal pain, weakness and fatigue.    Objective Vitals:   09/22/20 2005 09/22/20 2010 09/23/20 0528 09/23/20 0830  BP: 101/66  111/69 116/64  Pulse:  87 86 (!) 51  Resp:   20 20  Temp: 98.5 F (36.9 C)  98.5 F (36.9 C) 98.5 F (36.9 C)  TempSrc:   Oral Oral  SpO2:  97% 98% 95%  Weight:   71.8 kg   Height:       Physical Exam General:thin, frail male in NAD Heart:RRR, no mrg Lungs:CTAB, nml WOB on RA Abdomen:soft, NTND Extremities:no LE edema Dialysis Access: LU AVF +b/t   Filed Weights   09/22/20 0820 09/22/20 1145 09/23/20 0528  Weight: 75.2 kg 73.7 kg 71.8 kg    Intake/Output Summary (Last 24 hours) at 09/23/2020 1035 Last data filed at 09/23/2020 0100 Gross per 24 hour  Intake 1080 ml  Output 1500 ml  Net -420 ml    Additional Objective Labs: Basic Metabolic Panel: Recent Labs  Lab 09/21/20 0254 09/22/20 0056 09/23/20 0142  NA 132* 135 138  K 3.5 3.8 3.8  CL 96* 100 98  CO2 25 22 31   GLUCOSE 104* 101* 107*  BUN 21 28* 23  CREATININE 5.45* 6.68* 5.23*  CALCIUM 7.8* 7.8* 7.7*  PHOS 3.3  --   --     CBC: Recent Labs  Lab 09/19/20 0358 09/20/20 0505 09/21/20 0254 09/22/20 0056 09/23/20 0142  WBC 6.1 5.3 5.9 7.4 5.1  HGB 10.4* 9.5* 9.7* 9.6* 9.5*  HCT 32.1* 29.6* 30.3* 30.5* 30.6*  MCV 104.6* 102.8* 103.1* 105.2* 105.2*  PLT 120* 111* 123* 122* 129*    Medications:  sodium chloride     sodium chloride     sodium chloride     sodium chloride      apixaban  5 mg Oral BID   aspirin EC  81 mg Oral Daily   atorvastatin  80 mg Oral QHS   darbepoetin (ARANESP) injection - DIALYSIS  40 mcg Intravenous Q Tue-HD   isosorbide mononitrate  15 mg Oral Daily   lidocaine  1 patch Transdermal Q24H   metoprolol succinate  25 mg Oral Daily   sodium chloride  flush  3 mL Intravenous Q12H   sodium chloride flush  3 mL Intravenous Q12H   spironolactone  12.5 mg Oral Daily    Dialysis Orders: TTS [place] 4hr, 350/500, EDW 77.5kg, 3K/2.5Ca, AVF, heparin 3000 bolus - Venofer 50mg  IV q week - Calcitriol 0.47mcg PO q HD   Assessment/Plan:  New HFrEF (EF 25-30%): Prev 60-65% last year, cards consulted - LHC 7/22- multivessel CAD disease. Not a candidate for CABG. Medical therapy per cards.  Started on Eliquis. Cardiology would consider palliative PCI for refractory angina.  Recommend palliative care. [place] - Patient now DNR.  Wants to continue dialysis. Plans for outpatient palliative care.   Chest pain/B pleural effusions/pneumonia: CTA neg for PE/dissection. S/p extra HD on 7/20. On Unasyn for pneumonia coverage.   ESRD: Usual TTS schedule. HD tomorrow at outpatient center.   Hypertension/volume: BP low/normal, on metoprolol for now. 3-4kg under dry weight, will lower dry weight on d/c.   Anemia: Hgb 9.5 - Start ESA. Aranesp 40 with HD 7/26.  Metabolic bone disease: Ca/Phos ok - continue home binders/VDRA.  Cirrhosis/thrombocytopenia  Elevated Trop: Follow trends  AAA/new ascending thoracic AA: For OP follow-up per notes.  Thrombocytopenia - improving.  New A-Fib: per cardio not a good candidate for antiarrhythmic therapy or ablation  Jen Mow, PA-C [place] 09/23/2020,10:35 AM  LOS: 8 days

## 2020-09-23 NOTE — TOC Transition Note (Signed)
Transition of Care [facility]) - CM/SW Discharge Note   Patient Details  Name: Ricky Harris MRN: [id number] Date of Birth: 09-29-1940  [facility] CM/SW Contact:  Bethena Roys, RN Phone Number: 09/23/2020, 9:38 AM   Clinical Narrative: Case Manager spoke with patient concerning home health services. Patient has declined home health and states he should be fine once he gets home. Case Manager notified Staff RN and MD. No further needs identified at this time.    Final next level of care: Home/Self Care Barriers to Discharge: Continued Medical Work up   Patient Goals and CMS Choice Patient states their goals for this hospitalization and ongoing recovery are:: to return home with family support.   Choice offered to / list presented to : NA   Discharge Plan and Services In-house Referral: Hospice / Palliative Care Discharge Planning Services: CM Consult Post Acute Care Choice:  (Palliative Care to Consult at home.)            DME Agency: NA       HH Arranged:  (Patient declined outpatient PT services. Agreeable to outpatient palliative services.)     Readmission Risk Interventions Readmission Risk Prevention Plan 09/22/2020 02/20/2020 09/26/2019  Transportation Screening Complete Complete Complete  PCP or Specialist Appt within 5-7 Days - - -  PCP or Specialist Appt within 3-5 Days - Complete Complete  Home Care Screening - - -  Medication Review (RN CM) - - -  [place] or [place] - Complete Complete  Social Work Consult for [place] Planning/Counseling - Complete Complete  Palliative Care Screening - Not Applicable Not Applicable  Medication Review Press photographer) Complete Complete Complete  PCP or Specialist appointment within 3-5 days of discharge Complete - -  [place] or Home Care Consult Complete - -  SW Recovery Care/Counseling Consult Complete - -  Palliative Care Screening Complete - -  [place] Not Applicable - -   Some recent data might be hidden

## 2020-09-24 ENCOUNTER — Telehealth: Payer: Self-pay

## 2020-09-24 ENCOUNTER — Telehealth: Payer: Self-pay | Admitting: Nephrology

## 2020-09-24 NOTE — Telephone Encounter (Signed)
Transition of care contact from inpatient facility  Date of discharge: 09/23/20 Date of contact: 09/24/20 Method: Phone Spoke to: Patient  Patient contacted to discuss transition of care from recent inpatient hospitalization. Patient was admitted to [facility] from 7/19-7/27/22 with discharge diagnosis of Acute on chronic combined CHF with decreased EF 25-30%, multivessel CAD, new onset Afib.   Medication changes were reviewed.  Patient will follow up with his/her outpatient HD unit on: He is currently at his dialysis treatment.

## 2020-09-24 NOTE — Telephone Encounter (Signed)
Spoke with patient regarding scheduling a Palliative care consult. He requested a call back on Friday. He was at dialysis.

## 2020-09-27 ENCOUNTER — Other Ambulatory Visit: Payer: Self-pay

## 2020-09-27 ENCOUNTER — Emergency Department (HOSPITAL_COMMUNITY)
Admission: EM | Admit: 2020-09-27 | Discharge: 2020-09-27 | Discharge status: Against Medical Advice | Payer: Medicare Other | Attending: Emergency Medicine | Admitting: Emergency Medicine

## 2020-09-27 ENCOUNTER — Encounter (HOSPITAL_COMMUNITY): Payer: Self-pay | Admitting: Emergency Medicine

## 2020-09-27 DIAGNOSIS — Z5321 Procedure and treatment not carried out due to patient leaving prior to being seen by health care provider: Secondary | ICD-10-CM | POA: Insufficient documentation

## 2020-09-27 DIAGNOSIS — Z452 Encounter for adjustment and management of vascular access device: Secondary | ICD-10-CM | POA: Insufficient documentation

## 2020-09-27 LAB — BASIC METABOLIC PANEL
Anion gap: 10 (ref 5–15)
BUN: 26 mg/dL — ABNORMAL HIGH (ref 8–23)
CO2: 31 mmol/L (ref 22–32)
Calcium: 8.2 mg/dL — ABNORMAL LOW (ref 8.9–10.3)
Chloride: 97 mmol/L — ABNORMAL LOW (ref 98–111)
Creatinine, Ser: 4.46 mg/dL — ABNORMAL HIGH (ref 0.61–1.24)
GFR, Estimated: 13 mL/min — ABNORMAL LOW (ref >60)
Glucose, Bld: 99 mg/dL (ref 70–99)
Potassium: 3.6 mmol/L (ref 3.5–5.1)
Sodium: 138 mmol/L (ref 135–145)

## 2020-09-27 LAB — CBC WITH DIFFERENTIAL/PLATELET
Abs Immature Granulocytes: 0.02 10*3/uL (ref 0.00–0.07)
Basophils Absolute: 0.1 10*3/uL (ref 0.0–0.1)
Basophils Relative: 1 %
Eosinophils Absolute: 0.1 10*3/uL (ref 0.0–0.5)
Eosinophils Relative: 2 %
HCT: 32.6 % — ABNORMAL LOW (ref 39.0–52.0)
Hemoglobin: 10.2 g/dL — ABNORMAL LOW (ref 13.0–17.0)
Immature Granulocytes: 0 %
Lymphocytes Relative: 12 %
Lymphs Abs: 0.7 10*3/uL (ref 0.7–4.0)
MCH: 33.1 pg (ref 26.0–34.0)
MCHC: 31.3 g/dL (ref 30.0–36.0)
MCV: 105.8 fL — ABNORMAL HIGH (ref 80.0–100.0)
Monocytes Absolute: 0.6 10*3/uL (ref 0.1–1.0)
Monocytes Relative: 10 %
Neutro Abs: 4.3 10*3/uL (ref 1.7–7.7)
Neutrophils Relative %: 75 %
Platelets: 179 10*3/uL (ref 150–400)
RBC: 3.08 MIL/uL — ABNORMAL LOW (ref 4.22–5.81)
RDW: 15.8 % — ABNORMAL HIGH (ref 11.5–15.5)
WBC: 5.7 10*3/uL (ref 4.0–10.5)
nRBC: 0 % (ref 0.0–0.2)

## 2020-09-27 NOTE — ED Notes (Signed)
Called pt x3 for vitals. No response and not seen in lobby. Pulling OTF.

## 2020-09-27 NOTE — ED Provider Notes (Signed)
Emergency Medicine Provider Triage Evaluation Note  Ricky Harris , a 80 y.o. male  was evaluated in triage.  Pt complains of bleeding dialysis graft. Went to dialysis yesterday. Started to bleed over the night. On DOAC, last took last night. Attempted pressure with Band-Aid without relief. No lightheadedness, weakness. No prior complications with dialysis line previously.  Review of Systems  Positive: Dialysis graft bleeding Negative: lightheadedness  Physical Exam  There were no vitals taken for this visit. Gen:   Awake, no distress   Resp:  Normal effort  Cardiac: 2+ LUE pulse MSK:   Moves extremities without difficulty. Dialysis graft to LUE. 3 cm area of slow oozing to graft. No pulsatile bleeding Other:    Medical Decision Making  Medically screening exam initiated at 9:04 AM.  Appropriate orders placed.  [name] was informed that the remainder of the evaluation will be completed by another provider, this initial triage assessment does not replace that evaluation, and the importance of remaining in the ED until their evaluation is complete.  Abd pad and pressure wrap to graft. NV intact  Dialysis graft bleeding   Adianna Darwin A, PA-C 09/27/20 0910    Pattricia Boss, MD 09/28/20 1339

## 2020-09-27 NOTE — ED Triage Notes (Signed)
L upper arm dialysis graft bleeding since the middle of the night.  Dialysis yesterday.  Takes Eliquis.  Small amount of bleeding on arrival.  ABD pad and ACE bandage applied by PA at triage.

## 2020-10-05 ENCOUNTER — Telehealth: Payer: Self-pay

## 2020-10-05 NOTE — Telephone Encounter (Signed)
Spoke with patient and scheduled an in-person Palliative Consult for 11/11/20 @ 12:30PM  COVID screening was negative. One dog in the home will put away before NP arrives. Patient's son resides with him.    Consent obtained; updated Outlook/Netsmart/Team List and Epic.   Patient is aware he may be receiving a call from provider the day before or day of to confirm appointment.

## 2020-10-07 ENCOUNTER — Ambulatory Visit: Payer: Medicare Other

## 2020-11-10 ENCOUNTER — Encounter (HOSPITAL_COMMUNITY): Payer: Self-pay

## 2020-11-11 ENCOUNTER — Other Ambulatory Visit: Payer: Self-pay

## 2020-11-11 ENCOUNTER — Other Ambulatory Visit: Payer: Medicare Other | Admitting: Hospice

## 2020-11-11 DIAGNOSIS — R269 Unspecified abnormalities of gait and mobility: Secondary | ICD-10-CM

## 2020-11-11 DIAGNOSIS — M109 Gout, unspecified: Secondary | ICD-10-CM

## 2020-11-11 DIAGNOSIS — Z515 Encounter for palliative care: Secondary | ICD-10-CM

## 2020-11-11 DIAGNOSIS — N186 End stage renal disease: Secondary | ICD-10-CM

## 2020-11-11 NOTE — Progress Notes (Signed)
Therapist, nutritional Palliative Care Consult Note Telephone: [phone]  Fax: [phone]  PATIENT NAME: Ricky Harris [address] [place] [address] [phone] (home)  DOB: [birth year] MRN: [id number]  PRIMARY CARE PROVIDER:    Gordan Payment., MD,  [address] [place] [address] [phone]  REFERRING PROVIDER:   Gordan Payment., MD [address] [place],  [place] [address] [phone]  RESPONSIBLE PARTY:   Self Emergency contact: Arlys John [phone] best to call.   Contact Information     Name Relation Home Work [address]   Serafina Royals Son   [phone]   [place], kil[place] [phone]          I met face to face with patient and family at home\. Palliative Care was asked to follow this patient by consultation request of  Gordan Payment., MD to address advance care planning, complex medical decision making and goals of care clarification. Arlys John and his fiance Samantha at home with patient for visit. Clydie Braun DNP present with NP. Thayer Ohm and Donnal Debar his wife joined visit via telephone. This is the initial visit.    ASSESSMENT AND / RECOMMENDATIONS:   Advance Care Planning: Our advance care planning conversation included a discussion about:    The value and importance of advance care planning  Difference between Hospice and Palliative care Exploration of goals of care in the event of a sudden injury or illness  Identification and preparation of a healthcare agent  Review and updating or creation of an  advance directive document . Decision not to resuscitate or to de-escalate disease focused treatments due to poor prognosis.  CODE STATUS: Patient is a Do Not Resuscitate; Signed DNR in Epic  Goals of Care: Goals include to maximize quality of life and symptom management. Patient/family interested in hospice service in the future.   I spent 46 minutes providing this initial consultation. More than 50% of the time in this  consultation was spent on counseling patient and coordinating communication. --------------------------------------------------------------------------------------------------------------------------------------  Symptom Management/Plan: Gait disturbance: Patient will benefit from PT/OT. There is an order for PT OT for patient, per [facility].  She will call PCP to specify that it is for weakness and gait training.  Falls precautions discussed; benefits of PT/OT discussed. Cough: Mucinex 600 mg twice daily x3 days ESRD: Continue dialysis Tue [facility]. Well tolerated.  Liver Cirrhosis: Non alchoholic.  Continue Lactulose.  Gout: Managed with Febuxostat for prophylaxis; effective; patienet reports no flare for about 2 years.  Routine CBC CMP  Follow up: Palliative care will continue to follow for complex medical decision making, advance care planning, and clarification of goals. Return 6 weeks or prn.Encouraged to call provider sooner with any concerns.   Family /Caregiver/Community Supports: Patient lives at home and has family often to help with his care and to run errands.  HOSPICE ELIGIBILITY/DIAGNOSIS: TBD  Chief Complaint: Initial Palliative care visit  HISTORY OF PRESENT ILLNESS:  Ricky Harris is a 80 y.o. year old male  with multiple medical conditions including worsening gait disturbance with recent fall a week ago and associated resolving hematoma to left forehead.  Gait disturbance impairs his mobility and quality of life as well as his independence; it is worse when he stands up and tries to walk; holding onto his rolling walker is helpful.  Patient did not want to go to the hospital to be checked out; denies pain/discomfort, no headaches.  Patient endorses occasional coughing, no fever/chills.  History  of end-stage renal disease on hemodialysis, hypertension, hypothyroidism, AAA with a stent graft.  History obtained from review of EMR, discussion with primary team, caregiver, family  and/or Ricky Harris.  Review and summarization of Epic records shows history from other than patient. Rest of 10 point ROS asked and negative.     Review of lab tests/diagnostics   Results for Ricky Harris, Ricky Harris (MRN [id number]) as of 11/11/2020 17:59  Ref. Range 09/27/2020 09:11  Sodium Latest Ref Range: 135 - 145 mmol/L 138  Potassium Latest Ref Range: 3.5 - 5.1 mmol/L 3.6  Chloride Latest Ref Range: 98 - 111 mmol/L 97 (L)  CO2 Latest Ref Range: 22 - 32 mmol/L 31  Glucose Latest Ref Range: 70 - 99 mg/dL 99  BUN Latest Ref Range: 8 - 23 mg/dL 26 (H)  Creatinine Latest Ref Range: 0.61 - 1.24 mg/dL 4.46 (H)  Calcium Latest Ref Range: 8.9 - 10.3 mg/dL 8.2 (L)  Anion gap Latest Ref Range: 5 - 15  10  GFR, Estimated Latest Ref Range: >60 mL/min 13 (L)  Results for Ricky Harris, Ricky Harris (MRN [id number]) as of 11/11/2020 17:59  Ref. Range 09/27/2020 09:11  WBC Latest Ref Range: 4.0 - 10.5 K/uL 5.7  RBC Latest Ref Range: 4.22 - 5.81 MIL/uL 3.08 (L)  Hemoglobin Latest Ref Range: 13.0 - 17.0 g/dL 10.2 (L)  HCT Latest Ref Range: 39.0 - 52.0 % 32.6 (L)  MCV Latest Ref Range: 80.0 - 100.0 fL 105.8 (H)  MCH Latest Ref Range: 26.0 - 34.0 pg 33.1  MCHC Latest Ref Range: 30.0 - 36.0 g/dL 31.3  RDW Latest Ref Range: 11.5 - 15.5 % 15.8 (H)  Platelets Latest Ref Range: 150 - 400 K/uL 179  nRBC Latest Ref Range: 0.0 - 0.2 % 0.0    Physical Exam: Height/Weight: 6 feet/162 Ibs Constitutional: NAD General: Well groomed, cooperative EYES: anicteric sclera, lids intact, no discharge  ENMT: Moist mucous membrane CV: S1 S2, RRR, no LE edema Pulmonary: LCTA, no increased work of breathing, occasional cough, non productive Abdomen: active BS + 4 quadrants, soft and non tender GU: no suprapubic tenderness MSK: weakness, sarcopenia, limited ROM Skin: warm and dry, no rashes or wounds on visible skin Neuro:  weakness, otherwise non focal Psych: non-anxious affect Hem/lymph/immuno: no widespread bruising   PAST  MEDICAL HISTORY:  Active Ambulatory Problems    Diagnosis Date Noted   Ascites 05/20/2017   Normochromic normocytic anemia 05/20/2017   Essential hypertension 05/20/2017   CKD (chronic kidney disease) stage 3, GFR 30-59 ml/min (HCC) 05/20/2017   HLD (hyperlipidemia) 05/20/2017   Hypothyroidism 05/20/2017   Abdominal aortic aneurysm (AAA) ([place]) 01/21/2019   AAA (abdominal aortic aneurysm) without rupture ([place]) 01/21/2019   Hyperkalemia 09/23/2019   CKD (chronic kidney disease), stage V ([place]) 09/23/2019   Acute kidney insufficiency 09/23/2019   ESRD (end stage renal disease) ([place]) 02/18/2020   Dyspnea 02/18/2020   Allergy, unspecified, initial encounter 09/27/2019   Aortic valve sclerosis 02/23/2017   Atherosclerosis of abdominal aorta ([place]) 12/02/2016   Benign neoplasm of colon 04/01/2015   Bilateral chronic knee pain 03/11/2016   Left hip pain 06/13/2016   Cirrhosis of liver ([place]) 05/29/2017   Coronary artery disease involving native coronary artery of native heart without angina pectoris 02/23/2017   Degeneration of lumbar intervertebral disc 04/01/2015   Dependence on renal dialysis ([place]) 07/13/2020   Diarrhea, unspecified 09/26/2019   Encounter for immunization 11/19/2019   Erectile dysfunction 04/01/2015   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[place]) 07/13/2020  Gouty arthropathy 04/01/2015   H/O peptic ulcer 05/13/2015   History of prostate cancer 05/13/2015   Hypercalcemia 04/25/2020   Impaired cognition 02/26/2020   Iron deficiency anemia, unspecified 09/27/2019   Lumbar radiculopathy 05/22/2020   Osteoarthritis 04/01/2015   Fluid overload, unspecified 04/14/2020   Pain, unspecified 09/26/2019   Prediabetes 04/01/2015   Pruritus 04/15/2019   Pure hypercholesterolemia 07/13/2020   Renal artery stenosis ([place]) 04/01/2015   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   Senile nuclear sclerosis 04/05/2016   Senile purpura ([place]) 04/15/2019    Thrombocytopenia ([place]) 11/21/2015   Trochanteric bursitis of left hip 12/27/2018   Unspecified protein-calorie malnutrition ([place]) 09/27/2019   Vitamin B12 deficiency 05/13/2015   Bilateral pleural effusion 09/15/2020   Thoracic aortic aneurysm ([place]) 09/15/2020   Elevated troponin 09/15/2020   Chest pain 09/15/2020   CAP (community acquired pneumonia) 09/17/2020   A-fib ([place]) 09/18/2020   Resolved Ambulatory Problems    Diagnosis Date Noted   No Resolved Ambulatory Problems   Past Medical History:  Diagnosis Date   AAA (abdominal aortic aneurysm) ([place])    Anemia    Aortic stenosis    Arthritis    Cancer (HCC)    Chronic kidney disease    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([place])    Coronary artery disease    Gout    H/O hypercholesterolemia    Heart murmur    History of falling    Hypertension    Liver cirrhosis (HCC)    Wears glasses    Wears glasses     SOCIAL HX:  Social History   Tobacco Use   Smoking status: Never   Smokeless tobacco: Never  Substance Use Topics   Alcohol use: Not Currently     FAMILY HX:  Family History  Problem Relation Age of Onset   Diabetes Mellitus II Mother    Stroke Mother       ALLERGIES:  Allergies  Allergen Reactions   Cephalexin Hives   Hydralazine Hives      PERTINENT MEDICATIONS:  Outpatient Encounter Medications as of 11/11/2020  Medication Sig   apixaban (ELIQUIS) 5 MG TABS tablet Take 1 tablet (5 mg total) by mouth 2 (two) times daily.   atorvastatin (LIPITOR) 80 MG tablet Take 1 tablet (80 mg total) by mouth at bedtime.   calcium acetate (PHOSLO) 667 MG capsule Take 1 capsule (667 mg total) by mouth 3 (three) times daily with meals.   febuxostat (ULORIC) 40 MG tablet Take 1 tablet (40 mg total) by mouth daily.   isosorbide mononitrate (IMDUR) 30 MG 24 hr tablet Take 0.5 tablets (15 mg total) by mouth daily.   lactulose (CHRONULAC) 10 GM/15ML solution Take 15 mLs (10 g total) by mouth daily.   metoprolol  succinate (TOPROL-XL) 25 MG 24 hr tablet Take 1 tablet (25 mg total) by mouth daily.   multivitamin (RENA-VIT) TABS tablet Take 1 tablet by mouth daily.   spironolactone (ALDACTONE) 25 MG tablet Take 0.5 tablets (12.5 mg total) by mouth daily.   traMADol (ULTRAM) 50 MG tablet Take 1 tablet (50 mg total) by mouth every 8 (eight) hours as needed for moderate pain. (Patient not taking: Reported on 09/17/2020)   No facility-administered encounter medications on file as of 11/11/2020.    Thank you for the opportunity to participate in the care of Ricky Harris.  The palliative care team will continue to follow. Please call our office at [phone] if we can be of additional  assistance.   Note: Portions of this note were generated with Lobbyist. Dictation errors may occur despite best attempts at proofreading.  Teodoro Spray, NP

## 2020-12-30 ENCOUNTER — Other Ambulatory Visit: Payer: Medicare Other | Admitting: Hospice

## 2020-12-30 ENCOUNTER — Other Ambulatory Visit: Payer: Self-pay

## 2020-12-30 DIAGNOSIS — Z515 Encounter for palliative care: Secondary | ICD-10-CM

## 2020-12-30 NOTE — Progress Notes (Deleted)
[facility] Consult Note Telephone: [phone]  Fax: [phone]  PATIENT NAME: Ricky Harris DOB: 1940-03-03 MRN: [id number]  PRIMARY CARE PROVIDER:   [name]., MD [name]., MD [place],  [place] [address]  REFERRING PROVIDER: [name]., MD [name]., MD [place],  [place] [address]  RESPONSIBLE PARTY:   Contact Information     Name Relation Home Work Mobile   Karolee Stamps Son   [phone]   neilson, oehlert [phone]         Visit is to build trust and highlight Palliative Medicine as specialized medical care for people living with serious illness, aimed at facilitating better quality of life through symptoms relief, assisting with advance care planning and complex medical decision making. This is a follow up visit.  RECOMMENDATIONS/PLAN:   Advance Care Planning/Code Status:  Goals of Care: Goals of care include to maximize quality of life and symptom management.  Visit consisted of counseling and education dealing with the complex and emotionally intense issues of symptom management and palliative care in the setting of serious and potentially life-threatening illness. Palliative care team will continue to support patient, patient's family, and medical team.  Symptom management/Plan:   Follow up: Palliative care will continue to follow for complex medical decision making, advance care planning, and clarification of goals. Return 6 weeks or prn. Encouraged to call provider sooner with any concerns.  CHIEF COMPLAINT: Palliative follow up  HISTORY OF PRESENT ILLNESS:  Ricky Harris a 80 y.o. male with multiple medical problems including ***. History obtained from review of EMR, discussion with primary team, family and/or patient. Records reviewed and summarized above. All 10 point systems reviewed and are negative except as documented in history of present illness  above  Review and summarization of Epic records shows history from other than patient.   Palliative Care was asked to follow this patient o help address complex decision making in the context of advance care planning and goals of care clarification.  I reviewed patient records, labs, notes, testing and imaging myself as needed and where available.  PHYSICAL EXAM *** General: In no acute distress, appropriately dressed Cardiovascular: regular rate and rhythm; no edema in BLE Pulmonary: no cough, no increased work of breathing, normal respiratory effort Abdomen: soft, non tender, no guarding, positive bowel sounds in all quadrants GU:  no suprapubic tenderness Eyes: Normal lids, no discharge ENMT: Moist mucous membranes Musculoskeletal:  weakness, sarcopenia Skin: no rash to visible skin, warm without cyanosis,  Psych: non-anxious affect Neurological: Weakness but otherwise non focal Heme/lymph/immuno: no bruises, no bleeding  PERTINENT MEDICATIONS:  Outpatient Encounter Medications as of 12/30/2020  Medication Sig   apixaban (ELIQUIS) 5 MG TABS tablet Take 1 tablet (5 mg total) by mouth 2 (two) times daily.   atorvastatin (LIPITOR) 80 MG tablet Take 1 tablet (80 mg total) by mouth at bedtime.   calcium acetate (PHOSLO) 667 MG capsule Take 1 capsule (667 mg total) by mouth 3 (three) times daily with meals.   febuxostat (ULORIC) 40 MG tablet Take 1 tablet (40 mg total) by mouth daily.   isosorbide mononitrate (IMDUR) 30 MG 24 hr tablet Take 0.5 tablets (15 mg total) by mouth daily.   lactulose (CHRONULAC) 10 GM/15ML solution Take 15 mLs (10 g total) by mouth daily.   metoprolol succinate (TOPROL-XL) 25 MG 24 hr tablet Take 1 tablet (25 mg total) by mouth daily.  multivitamin (RENA-VIT) TABS tablet Take 1 tablet by mouth daily.   spironolactone (ALDACTONE) 25 MG tablet Take 0.5 tablets (12.5 mg total) by mouth daily.   traMADol (ULTRAM) 50 MG tablet Take 1 tablet (50 mg total) by mouth  every 8 (eight) hours as needed for moderate pain. (Patient not taking: Reported on 09/17/2020)   No facility-administered encounter medications on file as of 12/30/2020.    HOSPICE ELIGIBILITY/DIAGNOSIS: TBD  PAST MEDICAL HISTORY:  Past Medical History:  Diagnosis Date   A-fib ([place]) 09/18/2020   AAA (abdominal aortic aneurysm) ([place])    5.3cm , magd by vascular Dr Scot Dock ,   Anemia    " slightly"   Aortic stenosis    Arthritis    knees   Ascites    Cancer ([place])    prostate   Chronic kidney disease    stage 4, mgd by Dr Justin Mend    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[facility])    Coronary artery disease    on CT scan   Gout    H/O hypercholesterolemia    Heart murmur    History of falling    recent- see ct chest results of 04/25/2018   Hypertension    Hypothyroidism    Liver cirrhosis ([place])    Prediabetes    pt denies    Renal artery stenosis (HCC)    Thrombocytopenia (HCC)    Wears glasses    Wears glasses       ALLERGIES:  Allergies  Allergen Reactions   Cephalexin Hives   Hydralazine Hives      I spent  minutes providing this consultation; this includes time spent with patient/family, chart review and documentation. More than 50% of the time in this consultation was spent on counseling and coordinating communication   Thank you for the opportunity to participate in the care of Ricky Harris Please call our office at [phone] if we can be of additional assistance.  Note: Portions of this note were generated with Lobbyist. Dictation errors may occur despite best attempts at proofreading.  Teodoro Spray, NP

## 2020-12-30 NOTE — Progress Notes (Signed)
Visit did not hold; no answer to calls and no one came to the door.

## 2021-03-04 ENCOUNTER — Emergency Department (HOSPITAL_COMMUNITY)
Admission: EM | Admit: 2021-03-04 | Discharge: 2021-03-05 | Disposition: A | Discharge status: Home / Self Care | Payer: Medicare Other | Attending: Physician Assistant | Admitting: Physician Assistant

## 2021-03-04 ENCOUNTER — Encounter (HOSPITAL_COMMUNITY): Payer: Self-pay | Admitting: Emergency Medicine

## 2021-03-04 ENCOUNTER — Emergency Department (HOSPITAL_COMMUNITY): Payer: Medicare Other

## 2021-03-04 DIAGNOSIS — N186 End stage renal disease: Secondary | ICD-10-CM | POA: Diagnosis not present

## 2021-03-04 DIAGNOSIS — W1830XA Fall on same level, unspecified, initial encounter: Secondary | ICD-10-CM | POA: Insufficient documentation

## 2021-03-04 DIAGNOSIS — M25562 Pain in left knee: Secondary | ICD-10-CM | POA: Insufficient documentation

## 2021-03-04 DIAGNOSIS — Z5321 Procedure and treatment not carried out due to patient leaving prior to being seen by health care provider: Secondary | ICD-10-CM | POA: Insufficient documentation

## 2021-03-04 LAB — BASIC METABOLIC PANEL
Anion gap: 13 (ref 5–15)
BUN: 45 mg/dL — ABNORMAL HIGH (ref 8–23)
CO2: 23 mmol/L (ref 22–32)
Calcium: 8.5 mg/dL — ABNORMAL LOW (ref 8.9–10.3)
Chloride: 98 mmol/L (ref 98–111)
Creatinine, Ser: 7.1 mg/dL — ABNORMAL HIGH (ref 0.61–1.24)
GFR, Estimated: 7 mL/min — ABNORMAL LOW (ref >60)
Glucose, Bld: 112 mg/dL — ABNORMAL HIGH (ref 70–99)
Potassium: 4.7 mmol/L (ref 3.5–5.1)
Sodium: 134 mmol/L — ABNORMAL LOW (ref 135–145)

## 2021-03-04 LAB — CBC WITH DIFFERENTIAL/PLATELET
Abs Immature Granulocytes: 0.05 10*3/uL (ref 0.00–0.07)
Basophils Absolute: 0 10*3/uL (ref 0.0–0.1)
Basophils Relative: 0 %
Eosinophils Absolute: 0.1 10*3/uL (ref 0.0–0.5)
Eosinophils Relative: 1 %
HCT: 39.1 % (ref 39.0–52.0)
Hemoglobin: 12.1 g/dL — ABNORMAL LOW (ref 13.0–17.0)
Immature Granulocytes: 1 %
Lymphocytes Relative: 5 %
Lymphs Abs: 0.5 10*3/uL — ABNORMAL LOW (ref 0.7–4.0)
MCH: 32.4 pg (ref 26.0–34.0)
MCHC: 30.9 g/dL (ref 30.0–36.0)
MCV: 104.5 fL — ABNORMAL HIGH (ref 80.0–100.0)
Monocytes Absolute: 0.8 10*3/uL (ref 0.1–1.0)
Monocytes Relative: 8 %
Neutro Abs: 7.7 10*3/uL (ref 1.7–7.7)
Neutrophils Relative %: 85 %
Platelets: 141 10*3/uL — ABNORMAL LOW (ref 150–400)
RBC: 3.74 MIL/uL — ABNORMAL LOW (ref 4.22–5.81)
RDW: 15.8 % — ABNORMAL HIGH (ref 11.5–15.5)
WBC: 9.1 10*3/uL (ref 4.0–10.5)
nRBC: 0 % (ref 0.0–0.2)

## 2021-03-04 NOTE — ED Provider Triage Note (Signed)
Emergency Medicine Provider Triage Evaluation Note  Ricky Harris , a 81 y.o. male  was evaluated in triage.  Pt complains of fall at home. Hurt left knee pain. Missed dialysis this morning. Does not make any urine. Was able to get off floor however unable to walk due to pain. Did not hit head, LOC, no anticoagulation  Review of Systems  Positive: Fall, left knee pain, missed dialysis Negative: Headache, back pain  Physical Exam  There were no vitals taken for this visit. Gen:   Awake, no distress   Resp:  Normal effort  MSK:   Moves extremities without difficulty, tenderness to left knee Other:  [place] to dialysis graft LUE  Medical Decision Making  Medically screening exam initiated at 11:08 AM.  Appropriate orders placed.  [name] was informed that the remainder of the evaluation will be completed by another provider, this initial triage assessment does not replace that evaluation, and the importance of remaining in the ED until their evaluation is complete.  Fall, missed dialysis   Shaylen Nephew A, PA-C 03/04/21 1111

## 2021-03-04 NOTE — ED Triage Notes (Signed)
Patietn BIB [facility] from home with complaint of left knee pain after fall yesterday. Scheduled for dialysis today, did not go due to knee pain. Dialysis access in left arm, T,Th, Sat schedule, did not received treatment today. Patient has no other complaints at this time.

## 2021-03-05 NOTE — ED Notes (Signed)
Pt was called for a room x3 times. No response.

## 2021-03-10 ENCOUNTER — Other Ambulatory Visit: Payer: Medicare Other | Admitting: Hospice

## 2021-03-10 ENCOUNTER — Other Ambulatory Visit: Payer: Self-pay

## 2021-03-10 DIAGNOSIS — N186 End stage renal disease: Secondary | ICD-10-CM

## 2021-03-10 DIAGNOSIS — Z515 Encounter for palliative care: Secondary | ICD-10-CM

## 2021-03-10 DIAGNOSIS — R269 Unspecified abnormalities of gait and mobility: Secondary | ICD-10-CM

## 2021-03-10 NOTE — Progress Notes (Signed)
[facility] Consult Note Telephone: [phone]  Fax: [phone]  PATIENT NAME: Ricky Harris [address] [place] [address] [phone] (home)  DOB: 08/15/1940 MRN: [id number]  PRIMARY CARE PROVIDER:    [name]., MD,  [place] [address] [phone]  REFERRING PROVIDER:   [name]., MD [place],  [place] [address] [phone]  RESPONSIBLE PARTY:   [facility] [phone] Emergency contact: Brian [address]     Name [place] Harris, Ricky Son   [phone]   marvie, calender [phone]          I met face to face with patient at home. Palliative Care was asked to follow this patient by consultation request of  [name]., MD to address advance care planning, complex medical decision making and goals of care clarification. This is a follow up visit.  CODE STATUS: Patient is a Do Not Resuscitate; Signed DNR in Epic  Goals of Care: Goals include to maximize quality of life and symptom management. Patient/family interested in hospice service in the future.   Symptom Management/Plan: Gait disturbance: Fall last week with no injuries, per patient. Falls precautions discussed;  use of cane/rolling walker reiterated. Recommendation: PT/OT for strengthening and gait training.  ESRD: Continue dialysis Tue Thur Sats. Well tolerated, compliant.  Follow up: Palliative care will continue to follow for complex medical decision making, advance care planning, and clarification of goals. Return 6 weeks or prn.Encouraged to call provider sooner with any concerns.   Family /Caregiver/Community Supports: Patient lives at home with his son and son's faincee. Patient worked as Psychologist, counselling in an airport and travelled around the [place].   HOSPICE ELIGIBILITY/DIAGNOSIS: TBD  Chief Complaint: Follow up visit  HISTORY OF PRESENT ILLNESS:   Ricky Harris is a 81 y.o. year old male  with multiple medical conditions including  gait disturbance with recent fall last week, no injuries; patient was seen in the ED for it, not admitted. Gait disturbance impairs his mobility and quality of life as well as his independence; it is worse when he stands up and tries to walk; holding onto his rolling walker is helpful.  Patient denies pain/discomfort, dizziness or headaches. History  of end-stage renal disease on hemodialysis, hypertension, hypothyroidism, AAA with a stent graft, Gout, Liver Cirrhosis. History obtained from review of EMR, discussion with primary team, caregiver, family and/or Mr. Cheramie.  Review and summarization of Epic records shows history from other than patient. Rest of 10 point ROS asked and negative.  Physical Exam: BP 122/76 P92 02 98% RA R 18 Height/Weight: 6 feet/143 Ibs Constitutional: NAD General: Well groomed, cooperative EYES: anicteric sclera, lids intact, no discharge  ENMT: Moist mucous membrane CV: S1 S2, RRR, no LE edema Pulmonary: LCTA, no increased work of breathing, no cough Abdomen: active BS + 4 quadrants, soft and non tender GU: no suprapubic tenderness MSK: weakness, sarcopenia, limited ROM, ambulatory, gait imbalance Skin: warm and dry, no rashes or wounds on visible skin Neuro:  weakness, otherwise non focal Psych: non-anxious affect Hem/lymph/immuno: no widespread bruising   PAST MEDICAL HISTORY:  Active Ambulatory Problems    Diagnosis Date Noted   Ascites 05/20/2017   Normochromic normocytic anemia 05/20/2017   Essential hypertension 05/20/2017   CKD (chronic kidney disease) stage 3, GFR 30-59 ml/min ([facility]) 05/20/2017   HLD (hyperlipidemia) 05/20/2017  Hypothyroidism 05/20/2017   Abdominal aortic aneurysm (AAA) 01/21/2019   AAA (abdominal aortic aneurysm) without rupture 01/21/2019   Hyperkalemia 09/23/2019   CKD (chronic kidney disease), stage V ([facility]) 09/23/2019   Acute kidney  insufficiency 09/23/2019   ESRD (end stage renal disease) ([facility]) 02/18/2020   Dyspnea 02/18/2020   Allergy, unspecified, initial encounter 09/27/2019   Aortic valve sclerosis 02/23/2017   Atherosclerosis of abdominal aorta ([facility]) 12/02/2016   Benign neoplasm of colon 04/01/2015   Bilateral chronic knee pain 03/11/2016   Left hip pain 06/13/2016   Cirrhosis of liver ([facility]) 05/29/2017   Coronary artery disease involving native coronary artery of native heart without angina pectoris 02/23/2017   Degeneration of lumbar intervertebral disc 04/01/2015   Dependence on renal dialysis ([facility]) 07/13/2020   Diarrhea, unspecified 09/26/2019   Encounter for immunization 11/19/2019   Erectile dysfunction 04/01/2015   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[facility]) 07/13/2020   Gouty arthropathy 04/01/2015   H/O peptic ulcer 05/13/2015   History of prostate cancer 05/13/2015   Hypercalcemia 04/25/2020   Impaired cognition 02/26/2020   Iron deficiency anemia, unspecified 09/27/2019   Lumbar radiculopathy 05/22/2020   Osteoarthritis 04/01/2015   Fluid overload, unspecified 04/14/2020   Pain, unspecified 09/26/2019   Prediabetes 04/01/2015   Pruritus 04/15/2019   Pure hypercholesterolemia 07/13/2020   Renal artery stenosis ([facility]) 04/01/2015   Secondary hyperparathyroidism of renal origin ([facility]) 09/27/2019   Senile nuclear sclerosis 04/05/2016   Senile purpura ([facility]) 04/15/2019   Thrombocytopenia ([facility]) 11/21/2015   Trochanteric bursitis of left hip 12/27/2018   Unspecified protein-calorie malnutrition ([place]) 09/27/2019   Vitamin B12 deficiency 05/13/2015   Bilateral pleural effusion 09/15/2020   Thoracic aortic aneurysm 09/15/2020   Elevated troponin 09/15/2020   Chest pain 09/15/2020   CAP (community acquired pneumonia) 09/17/2020   A-fib ([facility]) 09/18/2020   Resolved Ambulatory Problems    Diagnosis Date Noted   No Resolved Ambulatory Problems   Past Medical History:  Diagnosis Date   AAA (abdominal  aortic aneurysm)    Anemia    Aortic stenosis    Arthritis    Cancer (HCC)    Chronic kidney disease    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([facility])    Coronary artery disease    Gout    H/O hypercholesterolemia    Heart murmur    History of falling    Hypertension    Liver cirrhosis (HCC)    Wears glasses    Wears glasses     SOCIAL HX:  Social History   Tobacco Use   Smoking status: Never   Smokeless tobacco: Never  Substance Use Topics   Alcohol use: Not Currently     FAMILY HX:  Family History  Problem Relation Age of Onset   Diabetes Mellitus II Mother    Stroke Mother       ALLERGIES:  Allergies  Allergen Reactions   Cephalexin Hives   Hydralazine Hives      PERTINENT MEDICATIONS:  Outpatient Encounter Medications as of 03/10/2021  Medication Sig   apixaban (ELIQUIS) 5 MG TABS tablet Take 1 tablet (5 mg total) by mouth 2 (two) times daily.   atorvastatin (LIPITOR) 80 MG tablet Take 1 tablet (80 mg total) by mouth at bedtime.   calcium acetate (PHOSLO) 667 MG capsule Take 1 capsule (667 mg total) by mouth 3 (three) times daily with meals.   febuxostat (ULORIC) 40 MG tablet Take 1 tablet (40 mg total) by mouth daily.   isosorbide mononitrate (IMDUR)  30 MG 24 hr tablet Take 0.5 tablets (15 mg total) by mouth daily.   lactulose (CHRONULAC) 10 GM/15ML solution Take 15 mLs (10 g total) by mouth daily.   metoprolol succinate (TOPROL-XL) 25 MG 24 hr tablet Take 1 tablet (25 mg total) by mouth daily.   multivitamin (RENA-VIT) TABS tablet Take 1 tablet by mouth daily.   spironolactone (ALDACTONE) 25 MG tablet Take 0.5 tablets (12.5 mg total) by mouth daily.   traMADol (ULTRAM) 50 MG tablet Take 1 tablet (50 mg total) by mouth every 8 (eight) hours as needed for moderate pain. (Patient not taking: Reported on 09/17/2020)   No facility-administered encounter medications on file as of 03/10/2021.    Thank you for the opportunity to participate in the care of Mr.  Calvin.  The palliative care team will continue to follow. Please call our office at [phone] if we can be of additional assistance.   Note: Portions of this note were generated with Lobbyist. Dictation errors may occur despite best attempts at proofreading.  Teodoro Spray, NP

## 2021-04-25 ENCOUNTER — Inpatient Hospital Stay (HOSPITAL_COMMUNITY): Payer: Medicare Other

## 2021-04-25 ENCOUNTER — Other Ambulatory Visit: Payer: Self-pay

## 2021-04-25 ENCOUNTER — Inpatient Hospital Stay (HOSPITAL_COMMUNITY)
Admission: EM | Admit: 2021-04-25 | Discharge: 2021-04-27 | DRG: 280 | Disposition: A | Discharge status: Home / Self Care | Payer: Medicare Other | Attending: Internal Medicine | Admitting: Internal Medicine

## 2021-04-25 ENCOUNTER — Encounter (HOSPITAL_COMMUNITY): Payer: Self-pay | Admitting: Family Medicine

## 2021-04-25 ENCOUNTER — Emergency Department (HOSPITAL_COMMUNITY): Payer: Medicare Other

## 2021-04-25 DIAGNOSIS — K7460 Unspecified cirrhosis of liver: Secondary | ICD-10-CM | POA: Diagnosis present

## 2021-04-25 DIAGNOSIS — E7800 Pure hypercholesterolemia, unspecified: Secondary | ICD-10-CM | POA: Diagnosis present

## 2021-04-25 DIAGNOSIS — Z9841 Cataract extraction status, right eye: Secondary | ICD-10-CM

## 2021-04-25 DIAGNOSIS — D539 Nutritional anemia, unspecified: Secondary | ICD-10-CM | POA: Diagnosis present

## 2021-04-25 DIAGNOSIS — Z961 Presence of intraocular lens: Secondary | ICD-10-CM | POA: Diagnosis present

## 2021-04-25 DIAGNOSIS — I5042 Chronic combined systolic (congestive) and diastolic (congestive) heart failure: Secondary | ICD-10-CM | POA: Diagnosis present

## 2021-04-25 DIAGNOSIS — I25119 Atherosclerotic heart disease of native coronary artery with unspecified angina pectoris: Secondary | ICD-10-CM | POA: Diagnosis present

## 2021-04-25 DIAGNOSIS — Z881 Allergy status to other antibiotic agents status: Secondary | ICD-10-CM

## 2021-04-25 DIAGNOSIS — Z9049 Acquired absence of other specified parts of digestive tract: Secondary | ICD-10-CM

## 2021-04-25 DIAGNOSIS — Z9842 Cataract extraction status, left eye: Secondary | ICD-10-CM

## 2021-04-25 DIAGNOSIS — I132 Hypertensive heart and chronic kidney disease with heart failure and with stage 5 chronic kidney disease, or end stage renal disease: Secondary | ICD-10-CM | POA: Diagnosis present

## 2021-04-25 DIAGNOSIS — K219 Gastro-esophageal reflux disease without esophagitis: Secondary | ICD-10-CM | POA: Diagnosis present

## 2021-04-25 DIAGNOSIS — I864 Gastric varices: Secondary | ICD-10-CM | POA: Diagnosis present

## 2021-04-25 DIAGNOSIS — N186 End stage renal disease: Secondary | ICD-10-CM

## 2021-04-25 DIAGNOSIS — I480 Paroxysmal atrial fibrillation: Secondary | ICD-10-CM

## 2021-04-25 DIAGNOSIS — M1990 Unspecified osteoarthritis, unspecified site: Secondary | ICD-10-CM | POA: Diagnosis present

## 2021-04-25 DIAGNOSIS — M109 Gout, unspecified: Secondary | ICD-10-CM | POA: Diagnosis present

## 2021-04-25 DIAGNOSIS — D631 Anemia in chronic kidney disease: Secondary | ICD-10-CM | POA: Diagnosis present

## 2021-04-25 DIAGNOSIS — E785 Hyperlipidemia, unspecified: Secondary | ICD-10-CM | POA: Diagnosis present

## 2021-04-25 DIAGNOSIS — Z7901 Long term (current) use of anticoagulants: Secondary | ICD-10-CM | POA: Diagnosis not present

## 2021-04-25 DIAGNOSIS — K7469 Other cirrhosis of liver: Secondary | ICD-10-CM | POA: Diagnosis not present

## 2021-04-25 DIAGNOSIS — I4510 Unspecified right bundle-branch block: Secondary | ICD-10-CM | POA: Diagnosis present

## 2021-04-25 DIAGNOSIS — E440 Moderate protein-calorie malnutrition: Secondary | ICD-10-CM | POA: Insufficient documentation

## 2021-04-25 DIAGNOSIS — Z9114 Patient's other noncompliance with medication regimen: Secondary | ICD-10-CM

## 2021-04-25 DIAGNOSIS — Z9181 History of falling: Secondary | ICD-10-CM

## 2021-04-25 DIAGNOSIS — I214 Non-ST elevation (NSTEMI) myocardial infarction: Secondary | ICD-10-CM | POA: Diagnosis present

## 2021-04-25 DIAGNOSIS — Z888 Allergy status to other drugs, medicaments and biological substances status: Secondary | ICD-10-CM

## 2021-04-25 DIAGNOSIS — E039 Hypothyroidism, unspecified: Secondary | ICD-10-CM | POA: Diagnosis present

## 2021-04-25 DIAGNOSIS — I350 Nonrheumatic aortic (valve) stenosis: Secondary | ICD-10-CM | POA: Diagnosis present

## 2021-04-25 DIAGNOSIS — Z20822 Contact with and (suspected) exposure to covid-19: Secondary | ICD-10-CM | POA: Diagnosis present

## 2021-04-25 DIAGNOSIS — Z66 Do not resuscitate: Secondary | ICD-10-CM | POA: Diagnosis present

## 2021-04-25 DIAGNOSIS — Z992 Dependence on renal dialysis: Secondary | ICD-10-CM

## 2021-04-25 DIAGNOSIS — E782 Mixed hyperlipidemia: Secondary | ICD-10-CM | POA: Diagnosis not present

## 2021-04-25 DIAGNOSIS — Z6820 Body mass index (BMI) 20.0-20.9, adult: Secondary | ICD-10-CM

## 2021-04-25 DIAGNOSIS — Z79899 Other long term (current) drug therapy: Secondary | ICD-10-CM

## 2021-04-25 DIAGNOSIS — I714 Abdominal aortic aneurysm, without rupture, unspecified: Secondary | ICD-10-CM | POA: Diagnosis present

## 2021-04-25 DIAGNOSIS — I10 Essential (primary) hypertension: Secondary | ICD-10-CM | POA: Diagnosis present

## 2021-04-25 DIAGNOSIS — I249 Acute ischemic heart disease, unspecified: Secondary | ICD-10-CM | POA: Diagnosis present

## 2021-04-25 DIAGNOSIS — N2581 Secondary hyperparathyroidism of renal origin: Secondary | ICD-10-CM | POA: Diagnosis present

## 2021-04-25 LAB — BASIC METABOLIC PANEL
Anion gap: 12 (ref 5–15)
BUN: 39 mg/dL — ABNORMAL HIGH (ref 8–23)
CO2: 30 mmol/L (ref 22–32)
Calcium: 9.3 mg/dL (ref 8.9–10.3)
Chloride: 96 mmol/L — ABNORMAL LOW (ref 98–111)
Creatinine, Ser: 4.01 mg/dL — ABNORMAL HIGH (ref 0.61–1.24)
GFR, Estimated: 14 mL/min — ABNORMAL LOW (ref >60)
Glucose, Bld: 149 mg/dL — ABNORMAL HIGH (ref 70–99)
Potassium: 3.8 mmol/L (ref 3.5–5.1)
Sodium: 138 mmol/L (ref 135–145)

## 2021-04-25 LAB — ECHOCARDIOGRAM COMPLETE
AR max vel: 1.64 cm2
AV Area VTI: 1.71 cm2
AV Area mean vel: 1.62 cm2
AV Mean grad: 11 mmHg
AV Peak grad: 20.3 mmHg
Ao pk vel: 2.25 m/s
Area-P 1/2: 4.29 cm2
Calc EF: 35.5 %
Height: 72 in
S' Lateral: 4.8 cm
Single Plane A2C EF: 35.8 %
Single Plane A4C EF: 34.6 %
Weight: 2426.82 oz

## 2021-04-25 LAB — CBC
HCT: 38.9 % — ABNORMAL LOW (ref 39.0–52.0)
Hemoglobin: 12.6 g/dL — ABNORMAL LOW (ref 13.0–17.0)
MCH: 32.3 pg (ref 26.0–34.0)
MCHC: 32.4 g/dL (ref 30.0–36.0)
MCV: 99.7 fL (ref 80.0–100.0)
Platelets: 202 10*3/uL (ref 150–400)
RBC: 3.9 MIL/uL — ABNORMAL LOW (ref 4.22–5.81)
RDW: 15.1 % (ref 11.5–15.5)
WBC: 12.3 10*3/uL — ABNORMAL HIGH (ref 4.0–10.5)
nRBC: 0 % (ref 0.0–0.2)

## 2021-04-25 LAB — CBG MONITORING, ED
Glucose-Capillary: 109 mg/dL — ABNORMAL HIGH (ref 70–99)
Glucose-Capillary: 106 mg/dL — ABNORMAL HIGH (ref 70–99)

## 2021-04-25 LAB — HEPARIN LEVEL (UNFRACTIONATED): Heparin Unfractionated: <0.1 IU/mL — ABNORMAL LOW (ref 0.30–0.70)

## 2021-04-25 LAB — GLUCOSE, CAPILLARY: Glucose-Capillary: 114 mg/dL — ABNORMAL HIGH (ref 70–99)

## 2021-04-25 LAB — APTT: aPTT: 33 seconds (ref 24–36)

## 2021-04-25 LAB — RESP PANEL BY RT-PCR (FLU A&B, COVID) ARPGX2
Influenza A by PCR: NEGATIVE
Influenza B by PCR: NEGATIVE
SARS Coronavirus 2 by RT PCR: NEGATIVE

## 2021-04-25 LAB — HEMOGLOBIN A1C
Hgb A1c MFr Bld: 4.5 % — ABNORMAL LOW (ref 4.8–5.6)
Mean Plasma Glucose: 82.45 mg/dL

## 2021-04-25 LAB — TROPONIN I (HIGH SENSITIVITY)
Troponin I (High Sensitivity): 197 ng/L (ref <18)
Troponin I (High Sensitivity): 1539 ng/L (ref <18)

## 2021-04-25 MED ORDER — ALPRAZOLAM 0.25 MG PO TABS: 0.2500 mg | ORAL_TABLET | Freq: Two times a day (BID) | ORAL | Status: DC | PRN

## 2021-04-25 MED ORDER — ONDANSETRON HCL 4 MG/2ML IJ SOLN
4.0000 mg | Freq: Four times a day (QID) | INTRAMUSCULAR | Status: DC | PRN
  Administered 2021-04-25: 4 mg via INTRAVENOUS
  Filled 2021-04-25: qty 2

## 2021-04-25 MED ORDER — CLOPIDOGREL BISULFATE 300 MG PO TABS
300.0000 mg | ORAL_TABLET | Freq: Once | ORAL | Status: AC
  Administered 2021-04-25: 300 mg via ORAL
  Filled 2021-04-25: qty 1

## 2021-04-25 MED ORDER — ASPIRIN EC 81 MG PO TBEC
81.0000 mg | DELAYED_RELEASE_TABLET | Freq: Every day | ORAL | Status: DC
  Administered 2021-04-26 – 2021-04-27 (×2): 81 mg via ORAL
  Filled 2021-04-25 (×2): qty 1

## 2021-04-25 MED ORDER — SODIUM CHLORIDE 0.9 % IV SOLN: INTRAVENOUS | Status: DC

## 2021-04-25 MED ORDER — HEPARIN (PORCINE) 25000 UT/250ML-% IV SOLN
1650.0000 [IU]/h | INTRAVENOUS | Status: DC
  Administered 2021-04-25: 04:00:00 1000 [IU]/h via INTRAVENOUS
  Administered 2021-04-26: 1650 [IU]/h via INTRAVENOUS
  Administered 2021-04-26: 1250 [IU]/h via INTRAVENOUS
  Administered 2021-04-27: 1650 [IU]/h via INTRAVENOUS
  Filled 2021-04-25 (×4): qty 250

## 2021-04-25 MED ORDER — RENA-VITE PO TABS
1.0000 | ORAL_TABLET | Freq: Every day | ORAL | Status: DC
  Administered 2021-04-25 – 2021-04-27 (×3): 1 via ORAL
  Filled 2021-04-25 (×3): qty 1

## 2021-04-25 MED ORDER — ACETAMINOPHEN 325 MG PO TABS: 650.0000 mg | ORAL_TABLET | ORAL | Status: DC | PRN

## 2021-04-25 MED ORDER — ASPIRIN 325 MG PO TABS: 325.0000 mg | ORAL_TABLET | Freq: Every day | ORAL | Status: DC

## 2021-04-25 MED ORDER — ASPIRIN EC 81 MG PO TBEC: 81.0000 mg | DELAYED_RELEASE_TABLET | Freq: Every day | ORAL | Status: DC

## 2021-04-25 MED ORDER — MAGNESIUM HYDROXIDE 400 MG/5ML PO SUSP: 30.0000 mL | Freq: Every day | ORAL | Status: DC | PRN

## 2021-04-25 MED ORDER — ASPIRIN 81 MG PO CHEW: 324.0000 mg | CHEWABLE_TABLET | ORAL | Status: DC

## 2021-04-25 MED ORDER — METOPROLOL SUCCINATE ER 25 MG PO TB24
25.0000 mg | ORAL_TABLET | Freq: Every day | ORAL | Status: DC
  Administered 2021-04-25 – 2021-04-26 (×2): 25 mg via ORAL
  Filled 2021-04-25 (×2): qty 1

## 2021-04-25 MED ORDER — NITROGLYCERIN 0.4 MG SL SUBL: 0.4000 mg | SUBLINGUAL_TABLET | SUBLINGUAL | Status: DC | PRN

## 2021-04-25 MED ORDER — MORPHINE SULFATE (PF) 2 MG/ML IV SOLN: 2.0000 mg | INTRAVENOUS | Status: DC | PRN

## 2021-04-25 MED ORDER — FEBUXOSTAT 40 MG PO TABS
40.0000 mg | ORAL_TABLET | Freq: Every day | ORAL | Status: DC
  Administered 2021-04-25 – 2021-04-27 (×3): 40 mg via ORAL
  Filled 2021-04-25 (×3): qty 1

## 2021-04-25 MED ORDER — INSULIN ASPART 100 UNIT/ML IJ SOLN
0.0000 [IU] | Freq: Three times a day (TID) | INTRAMUSCULAR | Status: DC
  Administered 2021-04-26: 2 [IU] via SUBCUTANEOUS

## 2021-04-25 MED ORDER — TRAZODONE HCL 50 MG PO TABS
25.0000 mg | ORAL_TABLET | Freq: Every evening | ORAL | Status: DC | PRN
Start: 2021-04-25 — End: 2021-04-27

## 2021-04-25 MED ORDER — CALCIUM ACETATE (PHOS BINDER) 667 MG PO CAPS
667.0000 mg | ORAL_CAPSULE | Freq: Three times a day (TID) | ORAL | Status: DC
  Administered 2021-04-25 – 2021-04-27 (×7): 667 mg via ORAL
  Filled 2021-04-25 (×7): qty 1

## 2021-04-25 MED ORDER — ASPIRIN 81 MG PO CHEW
324.0000 mg | CHEWABLE_TABLET | Freq: Once | ORAL | Status: AC
Start: 2021-04-25 — End: 2021-04-25
  Administered 2021-04-25: 324 mg via ORAL
  Filled 2021-04-25: qty 4

## 2021-04-25 MED ORDER — LACTULOSE 10 GM/15ML PO SOLN
10.0000 g | Freq: Every day | ORAL | Status: DC
  Administered 2021-04-25 – 2021-04-27 (×3): 10 g via ORAL
  Filled 2021-04-25: qty 15
  Filled 2021-04-25: qty 30
  Filled 2021-04-25: qty 15

## 2021-04-25 MED ORDER — CLOPIDOGREL BISULFATE 75 MG PO TABS
75.0000 mg | ORAL_TABLET | Freq: Every day | ORAL | Status: DC
  Administered 2021-04-26 – 2021-04-27 (×2): 75 mg via ORAL
  Filled 2021-04-25 (×2): qty 1

## 2021-04-25 MED ORDER — SPIRONOLACTONE 12.5 MG HALF TABLET
12.5000 mg | ORAL_TABLET | Freq: Every day | ORAL | Status: DC
  Administered 2021-04-25 – 2021-04-27 (×3): 12.5 mg via ORAL
  Filled 2021-04-25 (×4): qty 1

## 2021-04-25 MED ORDER — HEPARIN BOLUS VIA INFUSION
2000.0000 [IU] | Freq: Once | INTRAVENOUS | Status: AC
  Administered 2021-04-25: 2000 [IU] via INTRAVENOUS
  Filled 2021-04-25: qty 2000

## 2021-04-25 MED ORDER — ASPIRIN 300 MG RE SUPP: 300.0000 mg | RECTAL | Status: DC

## 2021-04-25 MED ORDER — ISOSORBIDE MONONITRATE ER 30 MG PO TB24
15.0000 mg | ORAL_TABLET | Freq: Every day | ORAL | Status: DC
Start: 2021-04-25 — End: 2021-04-27
  Administered 2021-04-25 – 2021-04-27 (×3): 15 mg via ORAL
  Filled 2021-04-25 (×4): qty 1

## 2021-04-25 MED ORDER — ATORVASTATIN CALCIUM 10 MG PO TABS
10.0000 mg | ORAL_TABLET | Freq: Every day | ORAL | Status: DC
Start: 2021-04-25 — End: 2021-04-27
  Administered 2021-04-25 – 2021-04-26 (×2): 10 mg via ORAL
  Filled 2021-04-25 (×2): qty 1

## 2021-04-25 NOTE — Consult Note (Addendum)
Cardiology Consultation:   Patient ID: Ricky Harris MRN: [id number]; DOB: [birth year]  Admit date: 04/25/2021 Date of Consult: 04/25/2021  Primary Care Provider: Raina Harris., MD [facility] Cardiologist: None  [facility] Electrophysiologist:  None   Patient Profile:   Ricky Harris is a 81 y.o. male with severe multivessel CAD not amenable to either CABG or PCI 2/2 comorbidities, pAF, ESRD, HTN, Hx AAA s/p endovascular repair (patent aortic stent graft with 6.1 cm native sac diameter), and As TAA who is being seen today for the evaluation of ACS at the request of Ricky. Ralene Harris.  History of Present Illness:   Mr. Ricky Harris reported sudden onset chest discomfort around 1800 (04/23/21) with radiation to the left arm and neck.  These episodes have been in waves over fairly persistent throughout the evening.  He denied any associated nausea, emesis, or SOB.  He is end-stage renal disease and was dialyzed on 02/26 with no chest pain during dialysis.  He is noncompliant with all of his medications and says he has not taken any while.  Initial ECG with diffuse ST depressions in inferior and lateral leads. ST elevations in anterior leads have been present in the past and likely all LVH.   CP 5/10 during my evaluation. Otherwise resting comfortably in bed.   Past Medical History:  Diagnosis Date   A-fib () 09/18/2020   AAA (abdominal aortic aneurysm)    5.3cm , magd by vascular Ricky Harris ,   Anemia    " slightly"   Aortic stenosis    Arthritis    knees   Ascites    Cancer ([facility])    prostate   Chronic kidney disease    stage 4, mgd by Ricky Harris    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[facility])    Coronary artery disease    on CT scan   Gout    H/O hypercholesterolemia    Heart murmur    History of falling    recent- see ct chest results of 04/25/2018   Hypertension    Hypothyroidism    Liver cirrhosis ([facility])    Prediabetes    pt denies    Renal artery stenosis (HCC)     Thrombocytopenia ([facility])    Wears glasses    Wears glasses    Past Surgical History:  Procedure Laterality Date   ABDOMINAL AORTIC ENDOVASCULAR STENT GRAFT N/A 01/21/2019   Procedure: ABDOMINAL AORTIC ENDOVASCULAR STENT GRAFT WITH CO2;  Surgeon: Angelia Mould, MD;  Location: [facility];  Service: Vascular;  Laterality: N/A;   ABDOMINAL AORTOGRAM W/LOWER EXTREMITY Bilateral 12/14/2018   Procedure: ABDOMINAL AORTOGRAM W/LOWER EXTREMITY;  Surgeon: Angelia Mould, MD;  Location: [facility] CV LAB;  Service: Cardiovascular;  Laterality: Bilateral;   AV FISTULA PLACEMENT Left 03/12/2019   Procedure: LEFT ARTERIOVENOUS Arteriovenous FISTULA CREATION.;  Surgeon: Angelia Mould, MD;  Location: [facility] OR;  Service: Vascular;  Laterality: Left;   CATARACT EXTRACTION W/ INTRAOCULAR LENS  IMPLANT, BILATERAL     CHOLECYSTECTOMY     ESOPHAGEAL BANDING N/A 08/11/2017   Procedure: ESOPHAGEAL BANDING;  Surgeon: Otis Brace, MD;  Location: [facility];  Service: Gastroenterology;  Laterality: N/A;   ESOPHAGEAL BANDING N/A 10/26/2017   Procedure: ESOPHAGEAL BANDING;  Surgeon: Otis Brace, MD;  [facility];  Service: Gastroenterology;  Laterality: N/A;   ESOPHAGEAL BANDING N/A 12/19/2017   Procedure: ESOPHAGEAL BANDING;  Surgeon: Otis Brace, MD;  [facility];  Service: Gastroenterology;  Laterality: N/A;  ESOPHAGOGASTRODUODENOSCOPY (EGD) WITH PROPOFOL N/A 07/05/2017   Procedure: ESOPHAGOGASTRODUODENOSCOPY (EGD) WITH PROPOFOL;  Surgeon: Otis Brace, MD;  Location: [facility];  Service: Gastroenterology;  Laterality: N/A;   ESOPHAGOGASTRODUODENOSCOPY (EGD) WITH PROPOFOL N/A 08/11/2017   Procedure: ESOPHAGOGASTRODUODENOSCOPY (EGD) WITH PROPOFOL;  Surgeon: Otis Brace, MD;  Location: [facility];  Service: Gastroenterology;  Laterality: N/A;   ESOPHAGOGASTRODUODENOSCOPY (EGD) WITH PROPOFOL N/A 10/26/2017   Procedure: ESOPHAGOGASTRODUODENOSCOPY (EGD)  WITH PROPOFOL;  Surgeon: Otis Brace, MD;  [facility];  Service: Gastroenterology;  Laterality: N/A;   ESOPHAGOGASTRODUODENOSCOPY (EGD) WITH PROPOFOL N/A 12/19/2017   Procedure: ESOPHAGOGASTRODUODENOSCOPY (EGD) WITH PROPOFOL;  Surgeon: Otis Brace, MD;  [facility];  Service: Gastroenterology;  Laterality: N/A;   ESOPHAGOGASTRODUODENOSCOPY (EGD) WITH PROPOFOL N/A 10/29/2018   Procedure: ESOPHAGOGASTRODUODENOSCOPY (EGD) WITH PROPOFOL;  Surgeon: Otis Brace, MD;  [facility];  Service: Gastroenterology;  Laterality: N/A;   ESOPHAGOGASTRODUODENOSCOPY (EGD) WITH PROPOFOL N/A 02/14/2020   Procedure: ESOPHAGOGASTRODUODENOSCOPY (EGD) WITH PROPOFOL;  Surgeon: Otis Brace, MD;  [facility];  Service: Gastroenterology;  Laterality: N/A;   EYE SURGERY     bilateral cataract removal with lens placement   HERNIA REPAIR     IR PARACENTESIS  12/26/2018   IR PARACENTESIS  01/02/2019   IR PARACENTESIS  01/15/2019   IR PARACENTESIS  01/30/2019   IR PARACENTESIS  02/05/2019   IR PARACENTESIS  02/14/2019   IR PARACENTESIS  02/28/2019   IR PARACENTESIS  03/14/2019   IR PARACENTESIS  03/29/2019   IR PARACENTESIS  04/11/2019   IR PARACENTESIS  04/25/2019   IR PARACENTESIS  05/09/2019   IR PARACENTESIS  06/03/2019   IR PARACENTESIS  06/06/2019   IR PARACENTESIS  06/20/2019   IR PARACENTESIS  07/03/2019   IR PARACENTESIS  07/19/2019   IR PARACENTESIS  08/08/2019   IR PARACENTESIS  08/27/2019   IR PARACENTESIS  09/09/2019   IR PARACENTESIS  09/24/2019   IR PARACENTESIS  10/07/2019   IR PARACENTESIS  10/23/2019   IR PARACENTESIS  11/07/2019   IR PARACENTESIS  11/21/2019   IR PARACENTESIS  12/04/2019   IR PARACENTESIS  12/25/2019   IR PARACENTESIS  01/10/2020   IR PARACENTESIS  01/27/2020   IR PARACENTESIS  02/19/2020   IR PARACENTESIS  03/27/2020   PROSTATECTOMY     RIGHT AND LEFT HEART CATH N/A 09/18/2020   Procedure: RIGHT AND LEFT HEART CATH;  Surgeon: Troy Sine, MD;  Location: [facility];  Service: Cardiovascular;  Laterality: N/A;   subtotal gastrectomy      Home Medications:  Prior to Admission medications   Medication Sig Start Date End Date Taking? Authorizing Provider  apixaban (ELIQUIS) 5 MG TABS tablet Take 1 tablet (5 mg total) by mouth 2 (two) times daily. 09/23/20 04/25/21 Yes Antonieta Pert, MD  atorvastatin (LIPITOR) 80 MG tablet Take 1 tablet (80 mg total) by mouth at bedtime. 09/23/20 10/23/20  Antonieta Pert, MD  calcium acetate (PHOSLO) 667 MG capsule Take 1 capsule (667 mg total) by mouth 3 (three) times daily with meals. 02/20/20   Rai, Vernelle Emerald, MD  febuxostat (ULORIC) 40 MG tablet Take 1 tablet (40 mg total) by mouth daily. 02/20/20   Rai, Vernelle Emerald, MD  isosorbide mononitrate (IMDUR) 30 MG 24 hr tablet Take 0.5 tablets (15 mg total) by mouth daily. 09/24/20 10/24/20  Antonieta Pert, MD  lactulose (CHRONULAC) 10 GM/15ML solution Take 15 mLs (10 g total) by mouth daily. 02/20/20   Rai, Vernelle Emerald, MD  metoprolol succinate (TOPROL-XL)  25 MG 24 hr tablet Take 1 tablet (25 mg total) by mouth daily. 09/24/20 10/24/20  Antonieta Pert, MD  multivitamin (RENA-VIT) TABS tablet Take 1 tablet by mouth daily. 09/27/19   Autry-Lott, Naaman Plummer, DO  spironolactone (ALDACTONE) 25 MG tablet Take 0.5 tablets (12.5 mg total) by mouth daily. 09/24/20 10/24/20  Antonieta Pert, MD  traMADol (ULTRAM) 50 MG tablet Take 1 tablet (50 mg total) by mouth every 8 (eight) hours as needed for moderate pain. Patient not taking: Reported on 09/17/2020 03/12/19   Gabriel Earing, PA-C    Inpatient Medications: Scheduled Meds:  aspirin  325 mg Oral Daily   atorvastatin  10 mg Oral Daily   calcium acetate  667 mg Oral TID WC   febuxostat  40 mg Oral Daily   insulin aspart  0-9 Units Subcutaneous TID AC & HS   isosorbide mononitrate  15 mg Oral Daily   lactulose  10 g Oral Daily   metoprolol succinate  25 mg Oral Daily   multivitamin  1 tablet Oral Daily   spironolactone   12.5 mg Oral Daily   Continuous Infusions:  sodium chloride 75 mL/hr at 04/25/21 4481   heparin 1,000 Units/hr (04/25/21 0343)   PRN Meds: acetaminophen, ALPRAZolam, morphine injection, nitroGLYCERIN, ondansetron (ZOFRAN) IV, traZODone  Allergies:    Allergies  Allergen Reactions   Cephalexin Hives   Hydralazine Hives    Social History:   Social History   Socioeconomic History   Marital status: Divorced    Spouse name: Not on file   Number of children: Not on file   Years of education: Not on file   Highest education level: Not on file  Occupational History   Not on file  Tobacco Use   Smoking status: Never   Smokeless tobacco: Never  Vaping Use   Vaping Use: Never used  Substance and Sexual Activity   Alcohol use: Not Currently   Drug use: Never   Sexual activity: Not on file  Other Topics Concern   Not on file  Social History Narrative   Not on file   Social Determinants of Health   Financial Resource Strain: Not on file  Food Insecurity: Not on file  Transportation Needs: Not on file  Physical Activity: Not on file  Stress: Not on file  Social Connections: Not on file  Intimate Partner Violence: Not on file    Family History:    Family History  Problem Relation Age of Onset   Diabetes Mellitus II Mother    Stroke Mother     ROS:  Review of Systems: [y] = yes, [ ]  = no      General: Weight gain [ ] ; Weight loss [ ] ; Anorexia [ ] ; Fatigue [ ] ; Fever [ ] ; Chills [ ] ; Weakness [ ]    Cardiac: Chest pain/pressure [y]; Resting SOB [ ] ; Exertional SOB [ ] ; Orthopnea [ ] ; Pedal Edema [ ] ; Palpitations [ ] ; Syncope [ ] ; Presyncope [ ] ; Paroxysmal nocturnal dyspnea [ ]    Pulmonary: Cough [ ] ; Wheezing [ ] ; Hemoptysis [ ] ; Sputum [ ] ; Snoring [ ]    GI: Vomiting [ ] ; Dysphagia [ ] ; Melena [ ] ; Hematochezia [ ] ; Heartburn [ ] ; Abdominal pain [ ] ; Constipation [ ] ; Diarrhea [ ] ; BRBPR [ ]    GU: Hematuria [ ] ; Dysuria [ ] ; Nocturia [ ]  Vascular: Pain in legs with  walking [ ] ; Pain in feet with lying flat [ ] ; Non-healing sores [ ] ; Stroke [ ] ;  TIA [ ] ; Slurred speech [ ] ;   Neuro: Headaches [ ] ; Vertigo [ ] ; Seizures [ ] ; Paresthesias [ ] ;Blurred vision [ ] ; Diplopia [ ] ; Vision changes [ ]    Ortho/Skin: Arthritis [ ] ; Joint pain [ ] ; Muscle pain [ ] ; Joint swelling [ ] ; Back Pain [ ] ; Rash [ ]    Psych: Depression [ ] ; Anxiety [ ]    Heme: Bleeding problems [ ] ; Clotting disorders [ ] ; Anemia [ ]    Endocrine: Diabetes [ ] ; Thyroid dysfunction [ ]    Physical Exam/Data:   Vitals:   04/25/21 0545 04/25/21 0600 04/25/21 0615 04/25/21 0630  BP: 124/71 125/74 126/70 116/69  Pulse: 93 97 96 95  Resp: 18 15 15 14   Temp:    98.5 F (36.9 C)  TempSrc:    Oral  SpO2: 98% 99% 99% 99%   No intake or output data in the 24 hours ending 04/25/21 0838 Last 3 Weights 09/23/2020 09/22/2020 09/22/2020  Weight (lbs) 158 lb 4.8 oz 162 lb 7.7 oz 165 lb 12.6 oz  Weight (kg) 71.804 kg 73.7 kg 75.2 kg     There is no height or weight on file to calculate BMI.  General:  Well nourished, well developed, in no acute distress HEENT: normal Lymph: no adenopathy Neck: no JVD Endocrine:  No thryomegaly Vascular: No carotid bruits; FA pulses 2+ bilaterally without bruits  Cardiac:  normal S1, S2; RRR; no murmur  Lungs:  clear to auscultation bilaterally, no wheezing, rhonchi or rales  Abd: soft, nontender, no hepatomegaly  Ext: no edema Musculoskeletal:  No deformities, BUE and BLE strength normal and equal Skin: warm and dry  Neuro:  CNs 2-12 intact, no focal abnormalities noted Psych:  Normal affect   EKG:  The EKG was personally reviewed and demonstrates: (04/25/21, 02:41:37) sinus tachycardia (ventricular rate 106, PR 156 ms, QRS 112 ms, Qtc 483 ms, inferior/lateral lead ST depressions, ST elevation V2/V3 similar to prior comparison and likely LVH Telemetry:  Telemetry was personally reviewed and demonstrates:  NSR-sinus tachycardia   Relevant CV  Studies:  Coronary angiography  RHC Result date: 09/18/20   Prox LAD to Mid LAD lesion is 85% stenosed.   Mid LAD lesion is 50% stenosed.   Dist LAD lesion is 20% stenosed.   Prox Cx to Mid Cx lesion is 65% stenosed.   Prox RCA lesion is 95% stenosed.   Mid RCA lesion is 90% stenosed.   LV end diastolic pressure is mildly elevated.   Hemodynamic findings consistent with mild pulmonary hypertension.   TTE Result date: 09/16/20  1. Left ventricular ejection fraction, by estimation, is 25 to 30%. The  left ventricle has severely decreased function. The left ventricle  demonstrates global hypokinesis. Left ventricular diastolic parameters are  consistent with Grade I diastolic  dysfunction (impaired relaxation).   2. Right ventricular systolic function is mildly reduced. The right  ventricular size is normal. There is normal pulmonary artery systolic  pressure. The estimated right ventricular systolic pressure is 32.1 mmHg.   3. Left atrial size was moderately dilated.   4. Right atrial size was mildly dilated.   5. The mitral valve is normal in structure. Trivial mitral valve  regurgitation. No evidence of mitral stenosis. Moderate mitral annular  calcification.   6. The aortic valve is tricuspid. Aortic valve regurgitation is not  visualized. Mild to moderate aortic valve sclerosis/calcification is  present, without any evidence of aortic stenosis.   7. Aortic dilatation noted. There is mild  dilatation of the ascending  aorta, measuring 40 mm.   8. The inferior vena cava is dilated in size with >50% respiratory  variability, suggesting right atrial pressure of 8 mmHg.   Laboratory Data:  High Sensitivity Troponin:   Recent Labs  Lab 04/25/21 0251 04/25/21 0603  TROPONINIHS 197* 1,539*     Chemistry Recent Labs  Lab 04/25/21 0251  NA 138  K 3.8  CL 96*  CO2 30  GLUCOSE 149*  BUN 39*  CREATININE 4.01*  CALCIUM 9.3  GFRNONAA 14*  ANIONGAP 12    No results for  input(s): PROT, ALBUMIN, AST, ALT, ALKPHOS, BILITOT in the last 168 hours. Hematology Recent Labs  Lab 04/25/21 0251  WBC 12.3*  RBC 3.90*  HGB 12.6*  HCT 38.9*  MCV 99.7  MCH 32.3  MCHC 32.4  RDW 15.1  PLT 202   BNPNo results for input(s): BNP, PROBNP in the last 168 hours.  DDimer No results for input(s): DDIMER in the last 168 hours.  Radiology/Studies:  [facility] Chest Port 1 View  Result Date: 04/25/2021 CLINICAL DATA:  Chest pain. EXAM: PORTABLE CHEST 1 VIEW COMPARISON:  Chest radiograph dated 09/15/2020. FINDINGS: Cardiomegaly. No focal consolidation, pleural effusion or pneumothorax. Atherosclerotic calcification of the aorta. No acute osseous pathology. IMPRESSION: No acute cardiopulmonary process. Cardiomegaly. Electronically Signed   By: Anner Crete M.D.   On: 04/25/2021 03:07    TIMI Risk Score for Unstable Angina or Non-ST Elevation MI:   The patient's TIMI risk score is 7, which indicates a 41% risk of all cause mortality, new or recurrent myocardial infarction or need for urgent revascularization in the next 14 days.{  Assessment and Plan:   NSTEMI Patient with typical story for anginal chest pain along with ECG findings of inferior lateral ST depressions and elevated troponin all consistent with NSTEMI.  Treating medically as he is not candidate for additional interventions based off discussion with interventional cardiology and review of his prior cath.  He already has a severely reduced EF so he is in a tenuous situation.  We discussed goals of care and he confirmed DNAR. He should be on apixaban/plavix for both AF and ACS but has not been taking any medications. Since we will not plan for invasive assessment will load P2Y12i (300 mg with known gastric varices) and continue on DAPT until transitioning back to apixaban/plavix prior to discharge.  - s/p ASA load, continue daily  - continue heparin gtt - continue atorvastatin, repeat lipid panel  - TTE ordered - A1c  low - ok to continue GDMT for now but may need to d/c metoprolol if starts to develop shock   For questions or updates, please contact [place] Please consult [email or web address] for contact info under   Signed, Dion Body, MD  04/25/2021 8:38 AM

## 2021-04-25 NOTE — Assessment & Plan Note (Addendum)
Synthroid 

## 2021-04-25 NOTE — Assessment & Plan Note (Deleted)
[place]

## 2021-04-25 NOTE — ED Notes (Signed)
Cards is at bedside with patient at this time.

## 2021-04-25 NOTE — ED Triage Notes (Signed)
Pt c/o CP, L arm pain, pain into neck & head since approx 1800, waves of sharp & stabbing to dull & aching/pressure. No meds PTA. Hx AAA, ESRD, afib, compliant w "most" home medications.   L arm restricted

## 2021-04-25 NOTE — Assessment & Plan Note (Addendum)
?   Statin.

## 2021-04-25 NOTE — Assessment & Plan Note (Addendum)
[facility] cardiology consultation, recommended for medical management Eliquis, Plavix, statin, metoprolol, Imdur

## 2021-04-25 NOTE — Progress Notes (Signed)
Patient had an episode of vomiting after eating tuna salad for dinner. Was a large amount of vomit, not associated with any chest pain, and resolved with IV Zofran.   To note patient also told me in my admission assessment he has lost 25-30 lbs without trying and has had decreased appetite. He does like Nepro supplements and drank one when I gave it to him

## 2021-04-25 NOTE — H&P (Signed)
[place]   PATIENT NAME: Ricky Harris    MR#:  [id number]  DATE OF BIRTH:  December 26, 1940  DATE OF ADMISSION:  04/25/2021  PRIMARY CARE PHYSICIAN: [name]., MD   Patient is coming from: Home  REQUESTING/REFERRING PHYSICIAN: Quintella Reichert, MD   CHIEF COMPLAINT:   Chief Complaint  Patient presents with   Chest Pain    HISTORY OF PRESENT ILLNESS:  Ricky Harris is a 81 y.o. Caucasian male with medical history significant for end-stage renal disease on hemodialysis, coronary artery disease, dyslipidemia, hypertension, hypothyroidism, liver cirrhosis, prediabetes, atrial fibrillation, AAA, aortic stenosis, gout and osteoarthritis, who presented to the ER with acute onset of central chest pain felt this tightness and graded severe intensity 8/10 which started around 6 PM with radiation to his left arm.  He denied any history of nausea or vomiting or diaphoresis or dyspnea.  However he stated that he had nausea and vomiting before his chest pain started.  He had his last session of hemodialysis today.  He is occasionally noncompliant with his medications.  No cough or wheezing or hemoptysis.  No other bleeding diathesis.  No headache or dizziness or blurred vision.   ED Course: When he came to the ER, heart rate was 105 otherwise vital signs were within normal.  Labs revealed a blood glucose of 149 with chloride of 96 and BUN 39 creatinine 4.01.  High-sensitivity troponin was 197.  CBC showed WBC of 12.3 with anemia better than previous levels.  Influenza antigens and COVID-19 PCR came back negative. EKG as reviewed by me : EKG showed sinus tachycardia with rate 106 with possible left atrial enlargement, left axis deviation, incomplete right bundle branch block and LVH with repolarization abnormality, likely the reason for 2 mm anterolateral ST segment elevation with inferolateral T wave inversion. Repeat EKG showed normal sinus rhythm with a rate of 94 with PACs, probable left  atrial enlargement, redemonstrated LVH with secondary repolarization abnormality and an inferolateral ST segment depression.  Imaging: Portable chest X ray showed cardiomegaly with no acute cardiopulmonary disease.  The patient was given 4 baby aspirin and IV heparin bolus followed by drip.,  And also sublingual nitroglycerin.  Cardiology was notified.  Patient will be admitted to a progressive unit bed for further evaluation and management. PAST MEDICAL HISTORY:   Past Medical History:  Diagnosis Date   A-fib ([place]) 09/18/2020   AAA (abdominal aortic aneurysm)    5.3cm , magd by vascular Dr Scot Dock ,   Anemia    " slightly"   Aortic stenosis    Arthritis    knees   Ascites    Cancer ([place])    prostate   Chronic kidney disease    stage 4, mgd by Dr Justin Mend    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[facility])    Coronary artery disease    on CT scan   Gout    H/O hypercholesterolemia    Heart murmur    History of falling    recent- see ct chest results of 04/25/2018   Hypertension    Hypothyroidism    Liver cirrhosis ([place])    Prediabetes    pt denies    Renal artery stenosis ([facility])    Thrombocytopenia ([place])    Wears glasses    Wears glasses     PAST SURGICAL HISTORY:   Past Surgical History:  Procedure Laterality Date   ABDOMINAL AORTIC ENDOVASCULAR STENT GRAFT N/A 01/21/2019  Procedure: ABDOMINAL AORTIC ENDOVASCULAR STENT GRAFT WITH CO2;  Surgeon: Angelia Mould, MD;  Location: [place];  Service: Vascular;  Laterality: N/A;   ABDOMINAL AORTOGRAM W/LOWER EXTREMITY Bilateral 12/14/2018   Procedure: ABDOMINAL AORTOGRAM W/LOWER EXTREMITY;  Surgeon: Angelia Mould, MD;  Location: [place] [facility];  Service: Cardiovascular;  Laterality: Bilateral;   AV FISTULA PLACEMENT Left 03/12/2019   Procedure: LEFT ARTERIOVENOUS Arteriovenous FISTULA CREATION.;  Surgeon: Angelia Mould, MD;  Location: [facility];  Service: Vascular;  Laterality: Left;   CATARACT EXTRACTION W/  INTRAOCULAR LENS  IMPLANT, BILATERAL     CHOLECYSTECTOMY     ESOPHAGEAL BANDING N/A 08/11/2017   Procedure: ESOPHAGEAL BANDING;  Surgeon: Otis Brace, MD;  Location: [place];  Service: Gastroenterology;  Laterality: N/A;   ESOPHAGEAL BANDING N/A 10/26/2017   Procedure: ESOPHAGEAL BANDING;  Surgeon: Otis Brace, MD;  [facility]: [facility];  Service: Gastroenterology;  Laterality: N/A;   ESOPHAGEAL BANDING N/A 12/19/2017   Procedure: ESOPHAGEAL BANDING;  Surgeon: Otis Brace, MD;  Location: [facility];  Service: Gastroenterology;  Laterality: N/A;   ESOPHAGOGASTRODUODENOSCOPY (EGD) WITH PROPOFOL N/A 07/05/2017   Procedure: ESOPHAGOGASTRODUODENOSCOPY (EGD) WITH PROPOFOL;  Surgeon: Otis Brace, MD;  Location: [place];  Service: Gastroenterology;  Laterality: N/A;   ESOPHAGOGASTRODUODENOSCOPY (EGD) WITH PROPOFOL N/A 08/11/2017   Procedure: ESOPHAGOGASTRODUODENOSCOPY (EGD) WITH PROPOFOL;  Surgeon: Otis Brace, MD;  Location: [place];  Service: Gastroenterology;  Laterality: N/A;   ESOPHAGOGASTRODUODENOSCOPY (EGD) WITH PROPOFOL N/A 10/26/2017   Procedure: ESOPHAGOGASTRODUODENOSCOPY (EGD) WITH PROPOFOL;  Surgeon: Otis Brace, MD;  [facility];  Service: Gastroenterology;  Laterality: N/A;   ESOPHAGOGASTRODUODENOSCOPY (EGD) WITH PROPOFOL N/A 12/19/2017   Procedure: ESOPHAGOGASTRODUODENOSCOPY (EGD) WITH PROPOFOL;  Surgeon: Otis Brace, MD;  [facility];  Service: Gastroenterology;  Laterality: N/A;   ESOPHAGOGASTRODUODENOSCOPY (EGD) WITH PROPOFOL N/A 10/29/2018   Procedure: ESOPHAGOGASTRODUODENOSCOPY (EGD) WITH PROPOFOL;  Surgeon: Otis Brace, MD;  [facility];  Service: Gastroenterology;  Laterality: N/A;   ESOPHAGOGASTRODUODENOSCOPY (EGD) WITH PROPOFOL N/A 02/14/2020   Procedure: ESOPHAGOGASTRODUODENOSCOPY (EGD) WITH PROPOFOL;  Surgeon: Otis Brace, MD;  [facility];  Service: Gastroenterology;   Laterality: N/A;   EYE SURGERY     bilateral cataract removal with lens placement   HERNIA REPAIR     IR PARACENTESIS  12/26/2018   IR PARACENTESIS  01/02/2019   IR PARACENTESIS  01/15/2019   IR PARACENTESIS  01/30/2019   IR PARACENTESIS  02/05/2019   IR PARACENTESIS  02/14/2019   IR PARACENTESIS  02/28/2019   IR PARACENTESIS  03/14/2019   IR PARACENTESIS  03/29/2019   IR PARACENTESIS  04/11/2019   IR PARACENTESIS  04/25/2019   IR PARACENTESIS  05/09/2019   IR PARACENTESIS  06/03/2019   IR PARACENTESIS  06/06/2019   IR PARACENTESIS  06/20/2019   IR PARACENTESIS  07/03/2019   IR PARACENTESIS  07/19/2019   IR PARACENTESIS  08/08/2019   IR PARACENTESIS  08/27/2019   IR PARACENTESIS  09/09/2019   IR PARACENTESIS  09/24/2019   IR PARACENTESIS  10/07/2019   IR PARACENTESIS  10/23/2019   IR PARACENTESIS  11/07/2019   IR PARACENTESIS  11/21/2019   IR PARACENTESIS  12/04/2019   IR PARACENTESIS  12/25/2019   IR PARACENTESIS  01/10/2020   IR PARACENTESIS  01/27/2020   IR PARACENTESIS  02/19/2020   IR PARACENTESIS  03/27/2020   PROSTATECTOMY     RIGHT AND LEFT HEART CATH N/A 09/18/2020   Procedure: RIGHT AND LEFT HEART CATH;  Surgeon: Troy Sine, MD;  Location: [place] [facility];  Service: Cardiovascular;  Laterality: N/A;   subtotal gastrectomy      SOCIAL HISTORY:   Social History   Tobacco Use   Smoking status: Never   Smokeless tobacco: Never  Substance Use Topics   Alcohol use: Not Currently    FAMILY HISTORY:   Family History  Problem Relation Age of Onset   Diabetes Mellitus II Mother    Stroke Mother     DRUG ALLERGIES:   Allergies  Allergen Reactions   Cephalexin Hives   Hydralazine Hives    REVIEW OF SYSTEMS:   ROS As per history of present illness. All pertinent systems were reviewed above. Constitutional, HEENT, cardiovascular, respiratory, GI, GU, musculoskeletal, neuro, psychiatric, endocrine, integumentary and hematologic systems were reviewed and are otherwise  negative/unremarkable except for positive findings mentioned above in the HPI.   MEDICATIONS AT HOME:   Prior to Admission medications   Medication Sig Start Date End Date Taking? Authorizing Provider  apixaban (ELIQUIS) 5 MG TABS tablet Take 1 tablet (5 mg total) by mouth 2 (two) times daily. 09/23/20 04/25/21 Yes Antonieta Pert, MD  atorvastatin (LIPITOR) 80 MG tablet Take 1 tablet (80 mg total) by mouth at bedtime. 09/23/20 10/23/20  Antonieta Pert, MD  calcium acetate (PHOSLO) 667 MG capsule Take 1 capsule (667 mg total) by mouth 3 (three) times daily with meals. 02/20/20   Rai, Vernelle Emerald, MD  febuxostat (ULORIC) 40 MG tablet Take 1 tablet (40 mg total) by mouth daily. 02/20/20   Rai, Vernelle Emerald, MD  isosorbide mononitrate (IMDUR) 30 MG 24 hr tablet Take 0.5 tablets (15 mg total) by mouth daily. 09/24/20 10/24/20  Antonieta Pert, MD  lactulose (CHRONULAC) 10 GM/15ML solution Take 15 mLs (10 g total) by mouth daily. 02/20/20   Rai, Vernelle Emerald, MD  metoprolol succinate (TOPROL-XL) 25 MG 24 hr tablet Take 1 tablet (25 mg total) by mouth daily. 09/24/20 10/24/20  Antonieta Pert, MD  multivitamin (RENA-VIT) TABS tablet Take 1 tablet by mouth daily. 09/27/19   Autry-Lott, Naaman Plummer, DO  spironolactone (ALDACTONE) 25 MG tablet Take 0.5 tablets (12.5 mg total) by mouth daily. 09/24/20 10/24/20  Antonieta Pert, MD  traMADol (ULTRAM) 50 MG tablet Take 1 tablet (50 mg total) by mouth every 8 (eight) hours as needed for moderate pain. Patient not taking: Reported on 09/17/2020 03/12/19   Gabriel Earing, PA-C      VITAL SIGNS:  Blood pressure 125/74, pulse 97, temperature 98.3 F (36.8 C), temperature source Oral, resp. rate 15, SpO2 99 %.  PHYSICAL EXAMINATION:  Physical Exam  GENERAL:  81 y.o.-year-old Caucasian male patient lying in the bed with no acute distress.  EYES: Pupils equal, round, reactive to light and accommodation. No scleral icterus. Extraocular muscles intact.  HEENT: Head atraumatic, normocephalic.  Oropharynx and nasopharynx clear.  NECK:  Supple, no jugular venous distention. No thyroid enlargement, no tenderness.  LUNGS: Normal breath sounds bilaterally, no wheezing, rales,rhonchi or crepitation. No use of accessory muscles of respiration.  CARDIOVASCULAR: Regular rate and rhythm, S1, S2 normal. No murmurs, rubs, or gallops.  ABDOMEN: Soft, nondistended, nontender. Bowel sounds present. No organomegaly or mass.  EXTREMITIES: No pedal edema, cyanosis, or clubbing.  Left arm intact AV fistula. NEUROLOGIC: Cranial nerves II through XII are intact. Muscle strength 5/5 in all extremities. Sensation intact. Gait not checked.  PSYCHIATRIC: The patient is alert and oriented x 3.  Normal affect and good eye contact. SKIN: No obvious rash, lesion, or ulcer.  LABORATORY PANEL:   CBC Recent Labs  Lab 04/25/21 0251  WBC 12.3*  HGB 12.6*  HCT 38.9*  PLT 202   ------------------------------------------------------------------------------------------------------------------  Chemistries  Recent Labs  Lab 04/25/21 0251  NA 138  K 3.8  CL 96*  CO2 30  GLUCOSE 149*  BUN 39*  CREATININE 4.01*  CALCIUM 9.3   ------------------------------------------------------------------------------------------------------------------  Cardiac Enzymes No results for input(s): TROPONINI in the last 168 hours. ------------------------------------------------------------------------------------------------------------------  RADIOLOGY:  DG Chest Port 1 View  Result Date: 04/25/2021 CLINICAL DATA:  Chest pain. EXAM: PORTABLE CHEST 1 VIEW COMPARISON:  Chest radiograph dated 09/15/2020. FINDINGS: Cardiomegaly. No focal consolidation, pleural effusion or pneumothorax. Atherosclerotic calcification of the aorta. No acute osseous pathology. IMPRESSION: No acute cardiopulmonary process. Cardiomegaly. Electronically Signed   By: Anner Crete M.D.   On: 04/25/2021 03:07      IMPRESSION AND PLAN:   Assessment and Plan: * NSTEMI (non-ST elevated myocardial infarction) ([place])- (present on admission) - The patient will be admitted to the progress unit bed. - We will continue him on IV heparin. - He will be continued on high-dose statin and continue Imdur. - We will continue as needed sublingual nitroglycerin and as needed IV morphine sulfate for pain. - Beta-blocker therapy will be resumed. - We will place the patient on aspirin and hold off Eliquis. - Cardiology consult was obtained.  He was seen in the ER. - Conservative management with medical therapy will be the initial step per cardiology.  End-stage renal disease on hemodialysis [facility]) - The patient will need nephrology consult in a.m. for follow-up on hemodialysis. - This could be contributing to his troponin I elevation.  Essential hypertension- (present on admission) We will continue beta-blocker therapy and p.o. clonidine.  HLD (hyperlipidemia)- (present on admission) - We will continue high-dose statin therapy and check fasting lipids.    Hypothyroidism- (present on admission) We will continue Synthroid.  Cirrhosis of liver ([place])- (present on admission) - We will continue Aldactone. - We will resume lactulose.  Gout - We will continue Uloric.  GERD without esophagitis- (present on admission) - We will continue PPI therapy.  Paroxysmal atrial fibrillation ([facility]) - We will hold off Eliquis and place the patient on IV heparin as mentioned above. - The patient is currently in normal sinus rhythm. - Will continue beta-blocker therapy.    DVT prophylaxis: IV heparin. Advanced Care Planning:  Code Status: The patient is DNR/DNI.   Family Communication:  The plan of care was discussed in details with the patient (and family). I answered all questions. The patient agreed to proceed with the above mentioned plan. Further management will depend upon hospital course. Disposition Plan: Back to previous home  environment Consults called: Cardiology. All the records are reviewed and case discussed with ED provider.  Status is: Inpatient  At the time of the admission, it appears that the appropriate admission status for this patient is inpatient.  This is judged to be reasonable and necessary in order to provide the required intensity of service to ensure the patient's safety given the presenting symptoms, physical exam findings and initial radiographic and laboratory data in the context of comorbid conditions.  The patient requires inpatient status due to high intensity of service, high risk of further deterioration and high frequency of surveillance required.  I certify that at the time of admission, it is my clinical judgment that the patient will require inpatient hospital care extending more than 2 midnights.  Dispo: The patient is from: Home              Anticipated d/c is to: Home              Patient currently is not medically stable to d/c.              Difficult to place patient: No   Christel Mormon M.D on 04/25/2021 at 6:23 AM  [facility]   From 7 PM-7 AM, contact night-coverage [email or web address]  CC: Primary care physician; [name]., MD

## 2021-04-25 NOTE — Progress Notes (Signed)
°  Echocardiogram 2D Echocardiogram has been performed.  Johny Chess 04/25/2021, 5:10 PM

## 2021-04-25 NOTE — ED Provider Notes (Addendum)
[facility] EMERGENCY DEPARTMENT Provider Note   CSN: [id number] Arrival date & time: 04/25/21  0235     History  Chief Complaint  Patient presents with   Chest Pain    Ricky Harris is a 81 y.o. male.  The history is provided by the patient, a relative and medical records.  Chest Pain Ricky Harris is a 81 y.o. male who presents to the Emergency Department complaining of chest pain.  He presents to the emergency department accompanied by his son for evaluation of severe central chest tightness that started at 6 PM.  Pain radiates to the left upper extremity.  Pain is constant nature.  No associated nausea, shortness of breath.  He has ESRD and dialyzed today.  He had no pain with dialysis.  He is noncompliant with his medications.  He does have ESRD, cirrhosis, A-fib.    Home Medications Prior to Admission medications   Medication Sig Start Date End Date Taking? Authorizing Provider  apixaban (ELIQUIS) 5 MG TABS tablet Take 1 tablet (5 mg total) by mouth 2 (two) times daily. 09/23/20 04/25/21 Yes Antonieta Pert, MD  atorvastatin (LIPITOR) 80 MG tablet Take 1 tablet (80 mg total) by mouth at bedtime. 09/23/20 10/23/20  Antonieta Pert, MD  calcium acetate (PHOSLO) 667 MG capsule Take 1 capsule (667 mg total) by mouth 3 (three) times daily with meals. 02/20/20   Rai, Vernelle Emerald, MD  febuxostat (ULORIC) 40 MG tablet Take 1 tablet (40 mg total) by mouth daily. 02/20/20   Rai, Vernelle Emerald, MD  isosorbide mononitrate (IMDUR) 30 MG 24 hr tablet Take 0.5 tablets (15 mg total) by mouth daily. 09/24/20 10/24/20  Antonieta Pert, MD  lactulose (CHRONULAC) 10 GM/15ML solution Take 15 mLs (10 g total) by mouth daily. 02/20/20   Rai, Vernelle Emerald, MD  metoprolol succinate (TOPROL-XL) 25 MG 24 hr tablet Take 1 tablet (25 mg total) by mouth daily. 09/24/20 10/24/20  Antonieta Pert, MD  multivitamin (RENA-VIT) TABS tablet Take 1 tablet by mouth daily. 09/27/19   Autry-Lott, Naaman Plummer, DO  spironolactone  (ALDACTONE) 25 MG tablet Take 0.5 tablets (12.5 mg total) by mouth daily. 09/24/20 10/24/20  Antonieta Pert, MD  traMADol (ULTRAM) 50 MG tablet Take 1 tablet (50 mg total) by mouth every 8 (eight) hours as needed for moderate pain. Patient not taking: Reported on 09/17/2020 03/12/19   Gabriel Earing, PA-C      Allergies    Cephalexin and Hydralazine    Review of Systems   Review of Systems  Cardiovascular:  Positive for chest pain.  All other systems reviewed and are negative.  Physical Exam Updated Vital Signs BP 116/69    Pulse 95    Temp 98.5 F (36.9 C) (Oral)    Resp 14    SpO2 99%  Physical Exam Vitals and nursing note reviewed.  Constitutional:      Appearance: He is well-developed.     Comments: Chronically ill-appearing  HENT:     Head: Normocephalic and atraumatic.  Cardiovascular:     Rate and Rhythm: Regular rhythm. Tachycardia present.     Heart sounds: No murmur heard. Pulmonary:     Effort: Pulmonary effort is normal. No respiratory distress.     Breath sounds: Normal breath sounds.  Abdominal:     Palpations: Abdomen is soft.     Tenderness: There is no abdominal tenderness. There is no guarding or rebound.  Musculoskeletal:        General: No  tenderness.     Comments: Fistula in the left upper extremity with palpable thrill  Skin:    General: Skin is warm and dry.  Neurological:     Mental Status: He is alert and oriented to person, place, and time.  Psychiatric:        Behavior: Behavior normal.    ED Results / Procedures / Treatments   Labs (all labs ordered are listed, but only abnormal results are displayed) Labs Reviewed  BASIC METABOLIC PANEL - Abnormal; Notable for the following components:      Result Value   Chloride 96 (*)    Glucose, Bld 149 (*)    BUN 39 (*)    Creatinine, Ser 4.01 (*)    GFR, Estimated 14 (*)    All other components within normal limits  CBC - Abnormal; Notable for the following components:   WBC 12.3 (*)    RBC 3.90  (*)    Hemoglobin 12.6 (*)    HCT 38.9 (*)    All other components within normal limits  HEMOGLOBIN A1C - Abnormal; Notable for the following components:   Hgb A1c MFr Bld 4.5 (*)    All other components within normal limits  TROPONIN I (HIGH SENSITIVITY) - Abnormal; Notable for the following components:   Troponin I (High Sensitivity) 197 (*)    All other components within normal limits  TROPONIN I (HIGH SENSITIVITY) - Abnormal; Notable for the following components:   Troponin I (High Sensitivity) 1,539 (*)    All other components within normal limits  RESP PANEL BY RT-PCR (FLU A&B, COVID) [id number]  HEPARIN LEVEL (UNFRACTIONATED)  APTT    EKG None  Radiology DG Chest Port 1 View  Result Date: 04/25/2021 CLINICAL DATA:  Chest pain. EXAM: PORTABLE CHEST 1 VIEW COMPARISON:  Chest radiograph dated 09/15/2020. FINDINGS: Cardiomegaly. No focal consolidation, pleural effusion or pneumothorax. Atherosclerotic calcification of the aorta. No acute osseous pathology. IMPRESSION: No acute cardiopulmonary process. Cardiomegaly. Electronically Signed   By: Anner Crete M.D.   On: 04/25/2021 03:07    Procedures Procedures   CRITICAL CARE Performed by: Quintella Reichert   Total critical care time: 45 minutes  Critical care time was exclusive of separately billable procedures and treating other patients.  Critical care was necessary to treat or prevent imminent or life-threatening deterioration.  Critical care was time spent personally by me on the following activities: development of treatment plan with patient and/or surrogate as well as nursing, discussions with consultants, evaluation of patient's response to treatment, examination of patient, obtaining history from patient or surrogate, ordering and performing treatments and interventions, ordering and review of laboratory studies, ordering and review of radiographic studies, pulse oximetry and re-evaluation of patient's  condition.  Medications Ordered in ED Medications  nitroGLYCERIN (NITROSTAT) SL tablet 0.4 mg (has no administration in time range)  heparin ADULT infusion 100 units/mL (25000 units/253mL) (1,000 Units/hr Intravenous New Bag/Given 04/25/21 0343)  febuxostat (ULORIC) tablet 40 mg (has no administration in time range)  atorvastatin (LIPITOR) tablet 10 mg (has no administration in time range)  isosorbide mononitrate (IMDUR) 24 hr tablet 15 mg (has no administration in time range)  metoprolol succinate (TOPROL-XL) 24 hr tablet 25 mg (has no administration in time range)  spironolactone (ALDACTONE) tablet 12.5 mg (has no administration in time range)  calcium acetate (PHOSLO) capsule 667 mg (has no administration in time range)  lactulose (CHRONULAC) 10 GM/15ML solution 10 g (has no administration in time range)  multivitamin (RENA-VIT) tablet  1 tablet (has no administration in time range)  acetaminophen (TYLENOL) tablet 650 mg (has no administration in time range)  ondansetron (ZOFRAN) injection 4 mg (has no administration in time range)  0.9 %  sodium chloride infusion ( Intravenous New Bag/Given 04/25/21 0608)  ALPRAZolam Duanne Moron) tablet 0.25 mg (has no administration in time range)  traZODone (DESYREL) tablet 25 mg (has no administration in time range)  insulin aspart (novoLOG) injection 0-9 Units (has no administration in time range)  morphine (PF) 2 MG/ML injection 2 mg (has no administration in time range)  aspirin tablet 325 mg (has no administration in time range)  aspirin chewable tablet 324 mg (324 mg Oral Given 04/25/21 0300)    ED Course/ Medical Decision Making/ A&P                           Medical Decision Making Amount and/or Complexity of Data Reviewed Labs: ordered. Radiology: ordered.  Risk OTC drugs. Prescription drug management. Decision regarding hospitalization.   Patient with history of ESRD on dialysis, A-fib, coronary artery disease, cirrhosis here for  evaluation of chest pain since 6 PM.  EKG does have new changes concerning for acute ischemia.  Does not quite meet STEMI criteria.  Discussed with on-call cardiologist-will see the patient in the emergency department.  Plan to treat for ACS given his symptoms.  After treatment in the emergency department he is feeling improved.  Initial troponin is mildly elevated.  Plan to admit to the medicine service for ongoing treatment for ACS.  Hospitalist consulted for admission.        Final Clinical Impression(s) / ED Diagnoses Final diagnoses:  ACS (acute coronary syndrome) ([place])  ESRD on hemodialysis [facility])    Rx / DC Orders ED Discharge Orders     None         Quintella Reichert, MD 04/25/21 7341    Quintella Reichert, MD 04/25/21 [phone]

## 2021-04-25 NOTE — Assessment & Plan Note (Addendum)
Nephrology following.  Patient is on dialysis Tuesday, Thursday, Saturday

## 2021-04-25 NOTE — Assessment & Plan Note (Addendum)
PPI ?

## 2021-04-25 NOTE — TOC Initial Note (Signed)
Transition of Care [facility]) - Initial/Assessment Note    Patient Details  Name: KIYOTO SLOMSKI MRN: [id number] Date of Birth: [birth year]  Transition of Care [facility]) CM/SW Contact:    Verdell Carmine, RN Phone Number: 04/25/2021, 8:42 AM  Clinical Narrative:                  Transition of Care Department [facility]) has reviewed patient and no TOC needs have been identified at this time. We will continue to monitor patient advancement through interdisciplinary progression rounds. If new patient transition needs arise, please place a TOC consult.           Patient Goals and CMS Choice        Expected Discharge Plan and Services                                                Prior Living Arrangements/Services                       Activities of Daily Living      Permission Sought/Granted                  Emotional Assessment              Admission diagnosis:  NSTEMI (non-ST elevated myocardial infarction) [facility]) [I21.4] Patient Active Problem List   Diagnosis Date Noted   NSTEMI (non-ST elevated myocardial infarction) ([place]) 04/25/2021   GERD without esophagitis 04/25/2021   Paroxysmal atrial fibrillation ([place]) 04/25/2021   A-fib ([place]) 09/18/2020   CAP (community acquired pneumonia) 09/17/2020   Bilateral pleural effusion 09/15/2020   Thoracic aortic aneurysm 09/15/2020   Elevated troponin 09/15/2020   Chest pain 09/15/2020   Dependence on renal dialysis ([place]) 07/13/2020   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[place]) 07/13/2020   Pure hypercholesterolemia 07/13/2020   Lumbar radiculopathy 05/22/2020   Hypercalcemia 04/25/2020   Fluid overload, unspecified 04/14/2020   Impaired cognition 02/26/2020   End-stage renal disease on hemodialysis ([place]) 02/18/2020   Dyspnea 02/18/2020   Encounter for immunization 11/19/2019   Allergy, unspecified, initial encounter 09/27/2019   Iron deficiency anemia, unspecified 09/27/2019   Secondary  hyperparathyroidism of renal origin ([place]) 09/27/2019   Unspecified protein-calorie malnutrition ([place]) 09/27/2019   Diarrhea, unspecified 09/26/2019   Pain, unspecified 09/26/2019   Hyperkalemia 09/23/2019   CKD (chronic kidney disease), stage V ([place]) 09/23/2019   Acute kidney insufficiency 09/23/2019   Pruritus 04/15/2019   Senile purpura ([place]) 04/15/2019   Abdominal aortic aneurysm (AAA) 01/21/2019   AAA (abdominal aortic aneurysm) without rupture 01/21/2019   Trochanteric bursitis of left hip 12/27/2018   Cirrhosis of liver ([place]) 05/29/2017   Ascites 05/20/2017   Normochromic normocytic anemia 05/20/2017   Essential hypertension 05/20/2017   CKD (chronic kidney disease) stage 3, GFR 30-59 ml/min (HCC) 05/20/2017   HLD (hyperlipidemia) 05/20/2017   Hypothyroidism 05/20/2017   Aortic valve sclerosis 02/23/2017   Coronary artery disease involving native coronary artery of native heart without angina pectoris 02/23/2017   Atherosclerosis of abdominal aorta ([place]) 12/02/2016   Left hip pain 06/13/2016   Senile nuclear sclerosis 04/05/2016   Bilateral chronic knee pain 03/11/2016   Thrombocytopenia ([place]) 11/21/2015   H/O peptic ulcer 05/13/2015   History of prostate cancer 05/13/2015   Vitamin B12 deficiency 05/13/2015   Benign neoplasm of colon 04/01/2015  Degeneration of lumbar intervertebral disc 04/01/2015   Erectile dysfunction 04/01/2015   Gout 04/01/2015   Osteoarthritis 04/01/2015   Prediabetes 04/01/2015   Renal artery stenosis ([place]) 04/01/2015   PCP:  [name]., MD Pharmacy:   [facility], [place] [address] Phone: [phone] Fax: [phone]     Social Determinants of Health (SDOH) Interventions    Readmission Risk Interventions Readmission Risk Prevention Plan 09/22/2020 02/20/2020 09/26/2019  Transportation Screening Complete Complete Complete  PCP or Specialist Appt within 5-7 Days - - -   PCP or Specialist Appt within 3-5 Days - Complete Complete  Home Care Screening - - -  Medication Review (RN CM) - - -  [place] or [place] - Complete Complete  Social Work Consult for [place] Planning/Counseling - Complete Complete  Palliative Care Screening - Not Applicable Not Applicable  Medication Review Press photographer) Complete Complete Complete  PCP or Specialist appointment within 3-5 days of discharge Complete - -  [place] or Home Care Consult Complete - -  SW Recovery Care/Counseling Consult Complete - -  Palliative Care Screening Complete - -  [place] Not Applicable - -  Some recent data might be hidden

## 2021-04-25 NOTE — Progress Notes (Signed)
ANTICOAGULATION CONSULT NOTE - Follow-Up  Pharmacy Consult for Heparin Indication: chest pain/ACS and atrial fibrillation  Allergies  Allergen Reactions   Cephalexin Hives   Hydralazine Hives    Patient Measurements: Height: 6' (182.9 cm) Weight: 68.8 kg (151 lb 10.8 oz) IBW/kg (Calculated) : 77.6 Heparin Dosing Weight: 68.8 kg  Vital Signs: Temp: 98.5 F (36.9 C) (02/26 1200) Temp Source: Oral (02/26 1200) BP: 127/79 (02/26 1313) Pulse Rate: 95 (02/26 1313)  Labs: Recent Labs    04/25/21 0251 04/25/21 0603 04/25/21 1427  HGB 12.6*  --   --   HCT 38.9*  --   --   PLT 202  --   --   APTT  --   --  33  HEPARINUNFRC  --   --  <0.10*  CREATININE 4.01*  --   --   TROPONINIHS 197* 1,539*  --      Estimated Creatinine Clearance: 14.5 mL/min (A) (by C-G formula based on SCr of 4.01 mg/dL (H)).   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 Patient is presenting with left arm / neck pain. Heparin per pharmacy consult placed for NSTEMI and atrial fibrillation.  Patient on apixaban prior to arrival. Last dose 2/25 AM. Heparin level <0.1 so apixaban no longer affecting (?compliance) so will utilize heparin level for monitoring. CBC stable. No issues with line or bleeding reported per RN.  Goal of Therapy:  Heparin level 0.3-0.7 units/ml Monitor platelets by anticoagulation protocol: Yes   Plan:  D/c aPTT Bolus heparin 2000 units and increase heparin gtt to 1250 units/hr Will f/u 8 hr heparin level  Sherlon Handing, PharmD, BCPS Please see amion for complete clinical pharmacist phone list 04/25/2021 3:49 PM

## 2021-04-25 NOTE — Assessment & Plan Note (Addendum)
Continue clonidine, metoprolol, Aldactone

## 2021-04-25 NOTE — Progress Notes (Signed)
ANTICOAGULATION CONSULT NOTE - Initial Consult  Pharmacy Consult for Heparin  Indication: chest pain/ACS and atrial fibrillation  Allergies  Allergen Reactions   Cephalexin Hives   Hydralazine Hives     Vital Signs: Temp: 98.3 F (36.8 C) (02/26 0244) Temp Source: Oral (02/26 0244) BP: 128/74 (02/26 0315) Pulse Rate: 102 (02/26 0315)  Labs: Recent Labs    04/25/21 0251  HGB 12.6*  HCT 38.9*  PLT 202    CrCl cannot be calculated (Patient's most recent lab result is older than the maximum 21 days allowed.).   Medical History: Past Medical History:  Diagnosis Date   A-fib ([place]) 09/18/2020   AAA (abdominal aortic aneurysm)    5.3cm , magd by vascular Dr Scot Dock ,   Anemia    " slightly"   Aortic stenosis    Arthritis    knees   Ascites    Cancer ([place])    prostate   Chronic kidney disease    stage 4, mgd by Dr Justin Mend    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[facility])    Coronary artery disease    on CT scan   Gout    H/O hypercholesterolemia    Heart murmur    History of falling    recent- see ct chest results of 04/25/2018   Hypertension    Hypothyroidism    Liver cirrhosis ([place])    Prediabetes    pt denies    Renal artery stenosis (HCC)    Thrombocytopenia (HCC)    Wears glasses    Wears glasses       Assessment: 81 y/o M with left arm/neck pain. Starting heparin for r/o ACS. On Apixaban PTA for afib. Last dose of Apixaban was 2/25 AM, ok to start heparin now. Anticipate using aPTT to dose. Hgb 12.6. ESRD on HD.   Goal of Therapy:  Heparin level 0.3-0.7 units/ml aPTT 66-102 seconds Monitor platelets by anticoagulation protocol: Yes   Plan:  Start heparin drip at 1000 units/hr 1200 heparin level and aPTT Daily CBC, heparin level, and aPTT Monitor for bleeding  Narda Bonds, PharmD, BCPS Clinical Pharmacist Phone: [phone]

## 2021-04-25 NOTE — Assessment & Plan Note (Addendum)
Continue Aldactone

## 2021-04-25 NOTE — ED Notes (Signed)
Date and time results received: 04/25/21 @ 0743  Test: Delta Troponin  Critical Value: 1,539  Name of Provider Notified: Dr. Shanon Brow

## 2021-04-25 NOTE — Assessment & Plan Note (Addendum)
Eliquis, metoprolol

## 2021-04-26 DIAGNOSIS — K7469 Other cirrhosis of liver: Secondary | ICD-10-CM

## 2021-04-26 DIAGNOSIS — E785 Hyperlipidemia, unspecified: Secondary | ICD-10-CM

## 2021-04-26 DIAGNOSIS — I214 Non-ST elevation (NSTEMI) myocardial infarction: Principal | ICD-10-CM

## 2021-04-26 DIAGNOSIS — I10 Essential (primary) hypertension: Secondary | ICD-10-CM

## 2021-04-26 DIAGNOSIS — N186 End stage renal disease: Secondary | ICD-10-CM

## 2021-04-26 DIAGNOSIS — Z992 Dependence on renal dialysis: Secondary | ICD-10-CM

## 2021-04-26 LAB — GLUCOSE, CAPILLARY
Glucose-Capillary: 102 mg/dL — ABNORMAL HIGH (ref 70–99)
Glucose-Capillary: 164 mg/dL — ABNORMAL HIGH (ref 70–99)
Glucose-Capillary: 141 mg/dL — ABNORMAL HIGH (ref 70–99)
Glucose-Capillary: 114 mg/dL — ABNORMAL HIGH (ref 70–99)

## 2021-04-26 LAB — HEPATITIS B SURFACE ANTIGEN: Hepatitis B Surface Ag: NONREACTIVE

## 2021-04-26 LAB — BASIC METABOLIC PANEL
Anion gap: 14 (ref 5–15)
BUN: 58 mg/dL — ABNORMAL HIGH (ref 8–23)
CO2: 23 mmol/L (ref 22–32)
Calcium: 8 mg/dL — ABNORMAL LOW (ref 8.9–10.3)
Chloride: 100 mmol/L (ref 98–111)
Creatinine, Ser: 4.95 mg/dL — ABNORMAL HIGH (ref 0.61–1.24)
GFR, Estimated: 11 mL/min — ABNORMAL LOW (ref >60)
Glucose, Bld: 124 mg/dL — ABNORMAL HIGH (ref 70–99)
Potassium: 4.1 mmol/L (ref 3.5–5.1)
Sodium: 137 mmol/L (ref 135–145)

## 2021-04-26 LAB — CBC
HCT: 31.7 % — ABNORMAL LOW (ref 39.0–52.0)
Hemoglobin: 10.1 g/dL — ABNORMAL LOW (ref 13.0–17.0)
MCH: 32.1 pg (ref 26.0–34.0)
MCHC: 31.9 g/dL (ref 30.0–36.0)
MCV: 100.6 fL — ABNORMAL HIGH (ref 80.0–100.0)
Platelets: 145 10*3/uL — ABNORMAL LOW (ref 150–400)
RBC: 3.15 MIL/uL — ABNORMAL LOW (ref 4.22–5.81)
RDW: 15 % (ref 11.5–15.5)
WBC: 9.2 10*3/uL (ref 4.0–10.5)
nRBC: 0 % (ref 0.0–0.2)

## 2021-04-26 LAB — RETICULOCYTES
Immature Retic Fract: 22.4 % — ABNORMAL HIGH (ref 2.3–15.9)
RBC.: 3.13 MIL/uL — ABNORMAL LOW (ref 4.22–5.81)
Retic Count, Absolute: 60.4 10*3/uL (ref 19.0–186.0)
Retic Ct Pct: 1.9 % (ref 0.4–3.1)

## 2021-04-26 LAB — IRON AND TIBC
Iron: 141 ug/dL (ref 45–182)
Saturation Ratios: 76 % — ABNORMAL HIGH (ref 17.9–39.5)
TIBC: 186 ug/dL — ABNORMAL LOW (ref 250–450)
UIBC: 45 ug/dL

## 2021-04-26 LAB — VITAMIN B12: Vitamin B-12: 238 pg/mL (ref 180–914)

## 2021-04-26 LAB — FOLATE: Folate: 12.1 ng/mL (ref >5.9)

## 2021-04-26 LAB — HEPATITIS B SURFACE ANTIBODY,QUALITATIVE: Hep B S Ab: REACTIVE — AB

## 2021-04-26 LAB — HEPARIN LEVEL (UNFRACTIONATED)
Heparin Unfractionated: 0.1 IU/mL — ABNORMAL LOW (ref 0.30–0.70)
Heparin Unfractionated: 0.12 IU/mL — ABNORMAL LOW (ref 0.30–0.70)
Heparin Unfractionated: 0.38 IU/mL (ref 0.30–0.70)

## 2021-04-26 LAB — FERRITIN: Ferritin: 674 ng/mL — ABNORMAL HIGH (ref 24–336)

## 2021-04-26 MED ORDER — LIDOCAINE HCL (PF) 1 % IJ SOLN: 5.0000 mL | INTRAMUSCULAR | Status: DC | PRN

## 2021-04-26 MED ORDER — SODIUM CHLORIDE 0.9 % IV SOLN: 100.0000 mL | INTRAVENOUS | Status: DC | PRN

## 2021-04-26 MED ORDER — HEPARIN SODIUM (PORCINE) 1000 UNIT/ML DIALYSIS: 1000.0000 [IU] | INTRAMUSCULAR | Status: DC | PRN

## 2021-04-26 MED ORDER — LIDOCAINE-PRILOCAINE 2.5-2.5 % EX CREA: 1.0000 "application " | TOPICAL_CREAM | CUTANEOUS | Status: DC | PRN

## 2021-04-26 MED ORDER — ALTEPLASE 2 MG IJ SOLR: 2.0000 mg | Freq: Once | INTRAMUSCULAR | Status: DC | PRN

## 2021-04-26 MED ORDER — CHLORHEXIDINE GLUCONATE CLOTH 2 % EX PADS: 6.0000 | MEDICATED_PAD | Freq: Every day | CUTANEOUS | Status: DC

## 2021-04-26 MED ORDER — HEPARIN BOLUS VIA INFUSION
2000.0000 [IU] | Freq: Once | INTRAVENOUS | Status: AC
  Administered 2021-04-26: 2000 [IU] via INTRAVENOUS
  Filled 2021-04-26: qty 2000

## 2021-04-26 MED ORDER — PENTAFLUOROPROP-TETRAFLUOROETH EX AERO: 1.0000 "application " | INHALATION_SPRAY | CUTANEOUS | Status: DC | PRN

## 2021-04-26 MED ORDER — METOPROLOL SUCCINATE ER 25 MG PO TB24
37.5000 mg | ORAL_TABLET | Freq: Every day | ORAL | Status: DC
  Administered 2021-04-27: 37.5 mg via ORAL
  Filled 2021-04-26 (×2): qty 2

## 2021-04-26 NOTE — Progress Notes (Signed)
°  Progress Note   Patient: Ricky Harris:[id number] DOB: 12/15/1940 DOA: 04/25/2021     1 DOS: the patient was seen and examined on 04/26/2021   Brief hospital course: Ricky Harris is a 81 y.o. Caucasian male with medical history significant for end-stage renal disease on hemodialysis, coronary artery disease, dyslipidemia, hypertension, hypothyroidism, liver cirrhosis, prediabetes, atrial fibrillation, AAA, aortic stenosis, gout and osteoarthritis, who presented to the ER with acute onset of central chest pain felt this tightness and graded severe intensity 8/10 which started around 6 PM with radiation to his left arm.  EKG showed sinus tachycardia with rate 106 with possible left atrial enlargement, left axis deviation, incomplete right bundle branch block and LVH with repolarization abnormality, likely the reason for 2 mm anterolateral ST segment elevation with inferolateral T wave inversion. Repeat EKG showed normal sinus rhythm with a rate of 94 with PACs, probable left atrial enlargement, redemonstrated LVH with secondary repolarization abnormality and an inferolateral ST segment depression.  Patient was started on IV heparin, cardiology consulted.    Assessment and Plan: * NSTEMI (non-ST elevated myocardial infarction) [facility])- (present on admission) [place] cardiology consultation Planning to continue IV heparin for 48 hours Continue aspirin, statin, metoprolol, Imdur Plan to transition to Eliquis and Plavix prior to discharge  End-stage renal disease on hemodialysis [facility]) Nephrology consult.  Patient is on dialysis Tuesday, Thursday, Saturday  Paroxysmal atrial fibrillation (HCC) Continue IV heparin, metoprolol Transition to Eliquis for discharge  GERD without esophagitis- (present on admission) PPI  Gout [place]  Cirrhosis of liver ([place])- (present on admission) Continue Aldactone, lactulose  Hypothyroidism- (present on admission) Synthroid  HLD (hyperlipidemia)-  (present on admission) Statin   Essential hypertension- (present on admission) Continue clonidine, metoprolol, Aldactone        Subjective: Has no complaints this morning.  Denies any chest pain.  Physical Exam: Vitals:   04/25/21 2348 04/26/21 0356 04/26/21 1052 04/26/21 1112  BP: 106/62 105/62 121/73 121/73  Pulse: 81 78 85 89  Resp: 16 16  16   Temp: 98.9 F (37.2 C) 98.9 F (37.2 C)  98.6 F (37 C)  TempSrc: Oral Oral  Oral  SpO2: 97% 100%  98%  Weight:      Height:       Examination: General exam: Appears calm and comfortable  Respiratory system: Clear to auscultation. Respiratory effort normal. Cardiovascular system: S1 & S2 heard, RRR. No pedal edema. Gastrointestinal system: Abdomen is nondistended, soft and nontender. Normal bowel sounds heard. Central nervous system: Alert and oriented. Non focal exam. Speech clear  Extremities: Symmetric in appearance bilaterally  Skin: No rashes, lesions or ulcers on exposed skin  Psychiatry: Judgement and insight appear stable. Mood & affect appropriate.    Data Reviewed:  Creatinine 4.95, potassium 4.1, troponin 1539, hemoglobin 10.1  Family Communication: Son at bedside   Disposition: Status is: Inpatient Remains inpatient appropriate because: remains on IV heparin           Planned Discharge Destination: Home      Author: Dessa Phi, DO 04/26/2021 1:15 PM  For on call review [email or web address].

## 2021-04-26 NOTE — Progress Notes (Signed)
ANTICOAGULATION CONSULT NOTE   Pharmacy Consult for Heparin  Indication: chest pain/ACS and atrial fibrillation  Allergies  Allergen Reactions   Cephalexin Hives   Hydralazine Hives     Vital Signs: Temp: 98.9 F (37.2 C) (02/26 2348) Temp Source: Oral (02/26 2348) BP: 106/62 (02/26 2348) Pulse Rate: 81 (02/26 2348)  Labs: Recent Labs    04/25/21 0251 04/25/21 0603 04/25/21 1427 04/26/21 0050  HGB 12.6*  --   --  10.1*  HCT 38.9*  --   --  31.7*  PLT 202  --   --  145*  APTT  --   --  33  --   HEPARINUNFRC  --   --  <0.10* 0.10*  CREATININE 4.01*  --   --  4.95*  TROPONINIHS 197* 1,539*  --   --      Estimated Creatinine Clearance: 11.8 mL/min (A) (by C-G formula based on SCr of 4.95 mg/dL (H)).   Medical History: Past Medical History:  Diagnosis Date   A-fib ([place]) 09/18/2020   AAA (abdominal aortic aneurysm)    5.3cm , magd by vascular Dr Scot Dock ,   Anemia    " slightly"   Aortic stenosis    Arthritis    knees   Ascites    Cancer ([place])    prostate   Chronic kidney disease    stage 4, mgd by Dr Justin Mend    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[facility])    Coronary artery disease    on CT scan   Gout    H/O hypercholesterolemia    Heart murmur    History of falling    recent- see ct chest results of 04/25/2018   Hypertension    Hypothyroidism    Liver cirrhosis ([place])    Prediabetes    pt denies    Renal artery stenosis (HCC)    Thrombocytopenia (HCC)    Wears glasses    Wears glasses       Assessment: 81 y/o M with left arm/neck pain. Starting heparin for r/o ACS. On Apixaban PTA for afib. Last dose of Apixaban was 2/25 AM, ok to start heparin now. Anticipate using aPTT to dose. Hgb 12.6. ESRD on HD.   2/27 AM update:  Heparin level low  Goal of Therapy:  Heparin level 0.3-0.7 units/mL Monitor platelets by anticoagulation protocol: Yes   Plan:  Inc heparin to 1450 units/hr 1000 heparin level  Narda Bonds, PharmD, BCPS Clinical  Pharmacist Phone: [phone]

## 2021-04-26 NOTE — Consult Note (Signed)
[facility] Renal Consultation Note    Indication for Consultation:  Management of ESRD/hemodialysis; anemia, hypertension/volume and secondary hyperparathyroidism  JJH:ERDEYC, Clarita Crane., MD  HPI: Ricky Harris is a 81 y.o. male with ESRD on HD TTS at [facility]. His past medical history is significant for coronary artery disease, dyslipidemia, hypertension, hypothyroidism, liver cirrhosis, prediabetes, atrial fibrillation, AAA, aortic stenosis, gout, and osteoarthritis.  Patient presented to ED c/o chest pain which started Saturday evening. He received full treatment at his outpatient HD center on 04/24/21. On admission, EKG showed some abnormalities. Heparin drip was initiated and Cardiology was consulted. Patient seen and examined at bedside this morning. Patient's son also at bedside. He denies any acute complaints. Denies CP, SOB, and N/V. Labs include: K+ 4.1, BUN 58, SrCr 4.95, Ca 8.0, Hgb 10.1, and Troponin 1539. CXR showed cardiomegaly and no acute cardiopulmonary pathology. Plan for HD tomorrow per his usual schedule.   Past Medical History:  Diagnosis Date   A-fib ([place]) 09/18/2020   AAA (abdominal aortic aneurysm)    5.3cm , magd by vascular Dr Scot Dock ,   Anemia    " slightly"   Aortic stenosis    Arthritis    knees   Ascites    Cancer ([place])    prostate   Chronic kidney disease    stage 4, mgd by Dr Justin Mend    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[facility])    Coronary artery disease    on CT scan   Gout    H/O hypercholesterolemia    Heart murmur    History of falling    recent- see ct chest results of 04/25/2018   Hypertension    Hypothyroidism    Liver cirrhosis ([place])    Prediabetes    pt denies    Renal artery stenosis (HCC)    Thrombocytopenia ([place])    Wears glasses    Wears glasses    Past Surgical History:  Procedure Laterality Date   ABDOMINAL AORTIC ENDOVASCULAR STENT GRAFT N/A 01/21/2019   Procedure: ABDOMINAL AORTIC ENDOVASCULAR  STENT GRAFT WITH CO2;  Surgeon: Angelia Mould, MD;  Location: [place];  Service: Vascular;  Laterality: N/A;   ABDOMINAL AORTOGRAM W/LOWER EXTREMITY Bilateral 12/14/2018   Procedure: ABDOMINAL AORTOGRAM W/LOWER EXTREMITY;  Surgeon: Angelia Mould, MD;  Location: [place] CV [facility];  Service: Cardiovascular;  Laterality: Bilateral;   AV FISTULA PLACEMENT Left 03/12/2019   Procedure: LEFT ARTERIOVENOUS Arteriovenous FISTULA CREATION.;  Surgeon: Angelia Mould, MD;  Location: [facility] OR;  Service: Vascular;  Laterality: Left;   CATARACT EXTRACTION W/ INTRAOCULAR LENS  IMPLANT, BILATERAL     CHOLECYSTECTOMY     ESOPHAGEAL BANDING N/A 08/11/2017   Procedure: ESOPHAGEAL BANDING;  Surgeon: Otis Brace, MD;  Location: [place];  Service: Gastroenterology;  Laterality: N/A;   ESOPHAGEAL BANDING N/A 10/26/2017   Procedure: ESOPHAGEAL BANDING;  Surgeon: Otis Brace, MD;  Location: [facility];  Service: Gastroenterology;  Laterality: N/A;   ESOPHAGEAL BANDING N/A 12/19/2017   Procedure: ESOPHAGEAL BANDING;  Surgeon: Otis Brace, MD;  Location: [facility];  Service: Gastroenterology;  Laterality: N/A;   ESOPHAGOGASTRODUODENOSCOPY (EGD) WITH PROPOFOL N/A 07/05/2017   Procedure: ESOPHAGOGASTRODUODENOSCOPY (EGD) WITH PROPOFOL;  Surgeon: Otis Brace, MD;  Location: [place];  Service: Gastroenterology;  Laterality: N/A;   ESOPHAGOGASTRODUODENOSCOPY (EGD) WITH PROPOFOL N/A 08/11/2017   Procedure: ESOPHAGOGASTRODUODENOSCOPY (EGD) WITH PROPOFOL;  Surgeon: Otis Brace, MD;  Location: [place];  Service: Gastroenterology;  Laterality: N/A;   ESOPHAGOGASTRODUODENOSCOPY (  EGD) WITH PROPOFOL N/A 10/26/2017   Procedure: ESOPHAGOGASTRODUODENOSCOPY (EGD) WITH PROPOFOL;  Surgeon: Otis Brace, MD;  [facility];  Service: Gastroenterology;  Laterality: N/A;   ESOPHAGOGASTRODUODENOSCOPY (EGD) WITH PROPOFOL N/A 12/19/2017   Procedure:  ESOPHAGOGASTRODUODENOSCOPY (EGD) WITH PROPOFOL;  Surgeon: Otis Brace, MD;  [facility];  Service: Gastroenterology;  Laterality: N/A;   ESOPHAGOGASTRODUODENOSCOPY (EGD) WITH PROPOFOL N/A 10/29/2018   Procedure: ESOPHAGOGASTRODUODENOSCOPY (EGD) WITH PROPOFOL;  Surgeon: Otis Brace, MD;  [facility];  Service: Gastroenterology;  Laterality: N/A;   ESOPHAGOGASTRODUODENOSCOPY (EGD) WITH PROPOFOL N/A 02/14/2020   Procedure: ESOPHAGOGASTRODUODENOSCOPY (EGD) WITH PROPOFOL;  Surgeon: Otis Brace, MD;  [facility];  Service: Gastroenterology;  Laterality: N/A;   EYE SURGERY     bilateral cataract removal with lens placement   HERNIA REPAIR     IR PARACENTESIS  12/26/2018   IR PARACENTESIS  01/02/2019   IR PARACENTESIS  01/15/2019   IR PARACENTESIS  01/30/2019   IR PARACENTESIS  02/05/2019   IR PARACENTESIS  02/14/2019   IR PARACENTESIS  02/28/2019   IR PARACENTESIS  03/14/2019   IR PARACENTESIS  03/29/2019   IR PARACENTESIS  04/11/2019   IR PARACENTESIS  04/25/2019   IR PARACENTESIS  05/09/2019   IR PARACENTESIS  06/03/2019   IR PARACENTESIS  06/06/2019   IR PARACENTESIS  06/20/2019   IR PARACENTESIS  07/03/2019   IR PARACENTESIS  07/19/2019   IR PARACENTESIS  08/08/2019   IR PARACENTESIS  08/27/2019   IR PARACENTESIS  09/09/2019   IR PARACENTESIS  09/24/2019   IR PARACENTESIS  10/07/2019   IR PARACENTESIS  10/23/2019   IR PARACENTESIS  11/07/2019   IR PARACENTESIS  11/21/2019   IR PARACENTESIS  12/04/2019   IR PARACENTESIS  12/25/2019   IR PARACENTESIS  01/10/2020   IR PARACENTESIS  01/27/2020   IR PARACENTESIS  02/19/2020   IR PARACENTESIS  03/27/2020   PROSTATECTOMY     RIGHT AND LEFT HEART CATH N/A 09/18/2020   Procedure: RIGHT AND LEFT HEART CATH;  Surgeon: Troy Sine, MD;  Location: [place] [facility];  Service: Cardiovascular;  Laterality: N/A;   subtotal gastrectomy     Family History  Problem Relation Age of Onset   Diabetes Mellitus II Mother     Stroke Mother    Social History:  reports that he has never smoked. He has never used smokeless tobacco. He reports that he does not currently use alcohol. He reports that he does not use drugs. Allergies  Allergen Reactions   Cephalexin Hives   Hydralazine Hives   Prior to Admission medications   Medication Sig Start Date End Date Taking? Authorizing Provider  calcium acetate (PHOSLO) 667 MG capsule Take 1 capsule (667 mg total) by mouth 3 (three) times daily with meals. 02/20/20  Yes Rai, Ripudeep K, MD  cetirizine (ZYRTEC) 10 MG tablet Take 10 mg by mouth daily as needed for allergies.   Yes [provider]  traMADol (ULTRAM) 50 MG tablet Take 1 tablet (50 mg total) by mouth every 8 (eight) hours as needed for moderate pain. 03/12/19  Yes Rhyne, Hulen Shouts, PA-C  apixaban (ELIQUIS) 5 MG TABS tablet Take 1 tablet (5 mg total) by mouth 2 (two) times daily. Patient not taking: Reported on 04/25/2021 09/23/20 04/25/21  Antonieta Pert, MD  aspirin EC 81 MG tablet Take 81 mg by mouth daily. Swallow whole. Patient not taking: Reported on 04/25/2021    [provider]  atorvastatin (LIPITOR) 10 MG tablet Take  10 mg by mouth daily. Patient not taking: Reported on 04/25/2021 03/25/21   [provider]  atorvastatin (LIPITOR) 80 MG tablet Take 1 tablet (80 mg total) by mouth at bedtime. Patient not taking: Reported on 04/25/2021 09/23/20 06/05/21  Antonieta Pert, MD  carvedilol (COREG) 12.5 MG tablet Take 12.5 mg by mouth 2 (two) times daily. Patient not taking: Reported on 04/25/2021 03/25/21   [provider]  febuxostat (ULORIC) 40 MG tablet Take 1 tablet (40 mg total) by mouth daily. Patient not taking: Reported on 04/25/2021 02/20/20   Mendel Corning, MD  isosorbide mononitrate (IMDUR) 30 MG 24 hr tablet Take 0.5 tablets (15 mg total) by mouth daily. Patient not taking: Reported on 04/25/2021 09/24/20 06/05/21  Antonieta Pert, MD  lactulose (CHRONULAC) 10 GM/15ML solution Take 15  mLs (10 g total) by mouth daily. Patient not taking: Reported on 04/25/2021 02/20/20   Mendel Corning, MD  levothyroxine (SYNTHROID) 50 MCG tablet Take 50 mcg by mouth daily. Patient not taking: Reported on 04/25/2021 01/11/21   [provider]  metoprolol succinate (TOPROL-XL) 25 MG 24 hr tablet Take 1 tablet (25 mg total) by mouth daily. Patient not taking: Reported on 04/25/2021 09/24/20 06/05/21  Antonieta Pert, MD  multivitamin (RENA-VIT) TABS tablet Take 1 tablet by mouth daily. Patient not taking: Reported on 04/25/2021 09/27/19   Autry-Lott, Naaman Plummer, DO  spironolactone (ALDACTONE) 25 MG tablet Take 0.5 tablets (12.5 mg total) by mouth daily. Patient not taking: Reported on 04/25/2021 09/24/20 06/05/21  Antonieta Pert, MD   Current Facility-Administered Medications  Medication Dose Route Frequency Provider Last Rate Last Admin   acetaminophen (TYLENOL) tablet 650 mg  650 mg Oral Q4H PRN Mansy, Arvella Merles, MD       ALPRAZolam Duanne Moron) tablet 0.25 mg  0.25 mg Oral BID PRN Mansy, Arvella Merles, MD       aspirin EC tablet 81 mg  81 mg Oral Daily Dion Body, MD   81 mg at 04/26/21 1052   atorvastatin (LIPITOR) tablet 10 mg  10 mg Oral Daily Mansy, Jan A, MD   10 mg at 04/26/21 1052   calcium acetate (PHOSLO) capsule 667 mg  667 mg Oral TID WC Mansy, Arvella Merles, MD   667 mg at 04/26/21 1152   clopidogrel (PLAVIX) tablet 75 mg  75 mg Oral Daily Dion Body, MD   75 mg at 04/26/21 1052   febuxostat (ULORIC) tablet 40 mg  40 mg Oral Daily Mansy, Jan A, MD   40 mg at 04/26/21 1058   heparin ADULT infusion 100 units/mL (25000 units/258mL)  1,650 Units/hr Intravenous Continuous Kris Mouton, RPH 16.5 mL/hr at 04/26/21 1148 1,650 Units/hr at 04/26/21 1148   insulin aspart (novoLOG) injection 0-9 Units  0-9 Units Subcutaneous TID [facility] Mansy, Jan A, MD   2 Units at 04/26/21 1152   isosorbide mononitrate (IMDUR) 24 hr tablet 15 mg  15 mg Oral Daily Mansy, Jan A, MD   15 mg at 04/26/21 1052   lactulose  (CHRONULAC) 10 GM/15ML solution 10 g  10 g Oral Daily Mansy, Jan A, MD   10 g at 04/26/21 1056   [START ON 04/27/2021] metoprolol succinate (TOPROL-XL) 24 hr tablet 37.5 mg  37.5 mg Oral Daily Troy Sine, MD       morphine (PF) 2 MG/ML injection 2 mg  2 mg Intravenous Q4H PRN Mansy, Jan A, MD       multivitamin (RENA-VIT) tablet 1 tablet  1 tablet Oral Daily Mansy, Jan A, MD   1 tablet at 04/26/21 1052   nitroGLYCERIN (NITROSTAT) SL tablet 0.4 mg  0.4 mg Sublingual Q5 min PRN Quintella Reichert, MD       ondansetron [facility]) injection 4 mg  4 mg Intravenous Q6H PRN Mansy, Jan A, MD   4 mg at 04/25/21 1741   spironolactone (ALDACTONE) tablet 12.5 mg  12.5 mg Oral Daily Mansy, Jan A, MD   12.5 mg at 04/26/21 1052   traZODone (DESYREL) tablet 25 mg  25 mg Oral QHS PRN Mansy, Jan A, MD       Labs: Basic Metabolic Panel: Recent Labs  Lab 04/25/21 0251 04/26/21 0050  NA 138 137  K 3.8 4.1  CL 96* 100  CO2 30 23  GLUCOSE 149* 124*  BUN 39* 58*  CREATININE 4.01* 4.95*  CALCIUM 9.3 8.0*   Liver Function Tests: No results for input(s): AST, ALT, ALKPHOS, BILITOT, PROT, ALBUMIN in the last 168 hours. No results for input(s): LIPASE, AMYLASE in the last 168 hours. No results for input(s): AMMONIA in the last 168 hours. CBC: Recent Labs  Lab 04/25/21 0251 04/26/21 0050  WBC 12.3* 9.2  HGB 12.6* 10.1*  HCT 38.9* 31.7*  MCV 99.7 100.6*  PLT 202 145*   Cardiac Enzymes: No results for input(s): CKTOTAL, CKMB, CKMBINDEX, TROPONINI in the last 168 hours. CBG: Recent Labs  Lab 04/25/21 0820 04/25/21 1135 04/25/21 2113 04/26/21 0729 04/26/21 1111  GLUCAP 109* 106* 114* 102* 164*   Iron Studies:  Recent Labs    04/26/21 1241  IRON 141  TIBC 186*  FERRITIN 674*   Studies/Results: DG Chest Port 1 View  Result Date: 04/25/2021 CLINICAL DATA:  Chest pain. EXAM: PORTABLE CHEST 1 VIEW COMPARISON:  Chest radiograph dated 09/15/2020. FINDINGS: Cardiomegaly. No focal consolidation,  pleural effusion or pneumothorax. Atherosclerotic calcification of the aorta. No acute osseous pathology. IMPRESSION: No acute cardiopulmonary process. Cardiomegaly. Electronically Signed   By: Anner Crete M.D.   On: 04/25/2021 03:07   ECHOCARDIOGRAM COMPLETE  Result Date: 04/25/2021    ECHOCARDIOGRAM REPORT   Patient Name:   Ricky Harris Date of Exam: 04/25/2021 Medical Rec #:  [id number]      Height:       72.0 in Accession #:    [id number]     Weight:       151.7 lb Date of Birth:  [birth year]     BSA:          1.894 m Patient Age:    61 years       BP:           115/59 mmHg Patient Gender: M              HR:           91 bpm. Exam Location:  Inpatient Procedure: 2D Echo Indications:    NSTEMI  History:        Patient has prior history of Echocardiogram examinations, most                 recent 09/16/2020. End stage renal disease. Cirrhosis,                 Arrythmias:Paroxysmal A-Fib; Risk Factors:Hypertension and                 Dyslipidemia.  Sonographer:    Johny Chess RDCS Referring Phys: [id number] JAN A [place]  1. Left ventricular ejection fraction, by  estimation, is 25 to 30%. The left ventricle has severely decreased function. The left ventricle demonstrates global hypokinesis. There is mild left ventricular hypertrophy of the basal-septal segment. Left ventricular diastolic parameters are consistent with Grade II diastolic dysfunction (pseudonormalization).  2. Right ventricular systolic function is normal. The right ventricular size is normal. Tricuspid regurgitation signal is inadequate for assessing PA pressure.  3. Left atrial size was severely dilated.  4. Right atrial size was mildly dilated.  5. The mitral valve is normal in structure. Mild mitral valve regurgitation. No evidence of mitral stenosis.  6. The aortic valve is calcified. There is severe calcifcation of the aortic valve. There is severe thickening of the aortic valve. Aortic valve regurgitation is not visualized.  Aortic valve sclerosis/calcification is present, without any evidence of aortic stenosis. Aortic valve area, by VTI measures 1.71 cm. Aortic valve mean gradient measures 11.0 mmHg. Aortic valve Vmax measures 2.25 m/s.  7. Aortic dilatation noted. There is mild dilatation of the ascending aorta, measuring 40 mm.  8. The inferior vena cava is normal in size with <50% respiratory variability, suggesting right atrial pressure of 8 mmHg. FINDINGS  Left Ventricle: Left ventricular ejection fraction, by estimation, is 25 to 30%. The left ventricle has severely decreased function. The left ventricle demonstrates global hypokinesis. The left ventricular internal cavity size was normal in size. There is mild left ventricular hypertrophy of the basal-septal segment. Left ventricular diastolic parameters are consistent with Grade II diastolic dysfunction (pseudonormalization). Normal left ventricular filling pressure. Right Ventricle: The right ventricular size is normal. No increase in right ventricular wall thickness. Right ventricular systolic function is normal. Tricuspid regurgitation signal is inadequate for assessing PA pressure. Left Atrium: Left atrial size was severely dilated. Right Atrium: Right atrial size was mildly dilated. Pericardium: There is no evidence of pericardial effusion. Mitral Valve: The mitral valve is normal in structure. Mild mitral valve regurgitation. No evidence of mitral valve stenosis. Tricuspid Valve: The tricuspid valve is normal in structure. Tricuspid valve regurgitation is mild . No evidence of tricuspid stenosis. Aortic Valve: The aortic valve is calcified. There is severe calcifcation of the aortic valve. There is severe thickening of the aortic valve. Aortic valve regurgitation is not visualized. Aortic valve sclerosis/calcification is present, without any evidence of aortic stenosis. Aortic valve mean gradient measures 11.0 mmHg. Aortic valve peak gradient measures 20.2 mmHg. Aortic  valve area, by VTI measures 1.71 cm. Pulmonic Valve: The pulmonic valve was normal in structure. Pulmonic valve regurgitation is not visualized. No evidence of pulmonic stenosis. Aorta: Aortic dilatation noted. There is mild dilatation of the ascending aorta, measuring 40 mm. Venous: The inferior vena cava is normal in size with less than 50% respiratory variability, suggesting right atrial pressure of 8 mmHg. IAS/Shunts: No atrial level shunt detected by color flow Doppler.  LEFT VENTRICLE PLAX 2D LVIDd:         5.50 cm      Diastology LVIDs:         4.80 cm      LV e' medial:    4.24 cm/s LV PW:         1.10 cm      LV E/e' medial:  20.2 LV IVS:        1.20 cm      LV e' lateral:   12.00 cm/s LVOT diam:     2.10 cm      LV E/e' lateral: 7.1 LV SV:  69 LV SV Index:   36 LVOT Area:     3.46 cm  LV Volumes (MOD) LV vol d, MOD A2C: 187.0 ml LV vol d, MOD A4C: 185.0 ml LV vol s, MOD A2C: 120.0 ml LV vol s, MOD A4C: 121.0 ml LV SV MOD A2C:     67.0 ml LV SV MOD A4C:     185.0 ml LV SV MOD BP:      69.3 ml RIGHT VENTRICLE             IVC RV S prime:     12.80 cm/s  IVC diam: 2.10 cm TAPSE (M-mode): 2.6 cm LEFT ATRIUM             Index        RIGHT ATRIUM           Index LA diam:        5.60 cm 2.96 cm/m   RA Area:     22.60 cm LA Vol (A2C):   88.6 ml 46.77 ml/m  RA Volume:   69.20 ml  36.53 ml/m LA Vol (A4C):   92.5 ml 48.83 ml/m LA Biplane Vol: 94.1 ml 49.68 ml/m  AORTIC VALVE AV Area (Vmax):    1.64 cm AV Area (Vmean):   1.62 cm AV Area (VTI):     1.71 cm AV Vmax:           225.00 cm/s AV Vmean:          159.000 cm/s AV VTI:            0.402 m AV Peak Grad:      20.2 mmHg AV Mean Grad:      11.0 mmHg LVOT Vmax:         106.50 cm/s LVOT Vmean:        74.400 cm/s LVOT VTI:          0.198 m LVOT/AV VTI ratio: 0.49  AORTA Ao Root diam: 3.50 cm Ao Asc diam:  4.00 cm MITRAL VALVE MV Area (PHT): 4.29 cm    SHUNTS MV Decel Time: 177 msec    Systemic VTI:  0.20 m MV E velocity: 85.70 cm/s  Systemic Diam: 2.10  cm MV A velocity: 94.50 cm/s MV E/A ratio:  0.91 Fransico Him MD Electronically signed by Fransico Him MD Signature Date/Time: 04/25/2021/5:11:19 PM    Final     Physical Exam: Vitals:   04/25/21 2348 04/26/21 0356 04/26/21 1052 04/26/21 1112  BP: 106/62 105/62 121/73 121/73  Pulse: 81 78 85 89  Resp: 16 16  16   Temp: 98.9 F (37.2 C) 98.9 F (37.2 C)  98.6 F (37 C)  TempSrc: Oral Oral  Oral  SpO2: 97% 100%  98%  Weight:      Height:         General: WDWN NAD Head: NCAT sclera not icteric  Lungs: CTA bilaterally. No wheeze, rales or rhonchi. Breathing is unlabored. Heart: RRR. No murmur, rubs or gallops.  Abdomen: soft, nontender, +BS Lower extremities: no edema BLLE Neuro: AAOx3. Moves all extremities spontaneously. Dialysis Access: L AVF (+) B/T  Dialysis Orders:  TTS - [facility] 4hrs, BFR 350, DFR 500,  EDW 69kg, 3K/ 2.5Ca  Heparin 4000 bolus with HD; 2000 units mid-run Mircera 75 mcg q2wks - last 04/20/21 Home meds: Calcium Acetate 667mg  1 tab TID with meals  Assessment/Plan: NSTEMI: Cardiology following; treating medically at this time; continue DAPT Paroxysmal Afib: Currently in SR; Continue IV heparin  and metoprolol  ESRD - on HD TTS; plan for HD 2/28 per his usual schedule Hypertension/volume  - BP stable and euvolemic on exam; continue metoprolol and imdur Anemia of CKD - Hgb 10.1, ESA recently give on 04/20/21 Secondary Hyperparathyroidism - Ca slightly low, will use 3Ca bath here, monitor trends, may need to start VDRA. Continue binders Nutrition - Renal diet with fluid restriction  Tobie Poet, NP [facility] 04/26/2021, 2:14 PM

## 2021-04-26 NOTE — Hospital Course (Addendum)
KIING DEAKIN is a 81 y.o. Caucasian male with medical history significant for end-stage renal disease on hemodialysis, coronary artery disease, dyslipidemia, hypertension, hypothyroidism, liver cirrhosis, prediabetes, atrial fibrillation, AAA, aortic stenosis, gout and osteoarthritis, who presented to the ER with acute onset of central chest pain felt this tightness and graded severe intensity 8/10 which started around 6 PM with radiation to his left arm.  EKG showed sinus tachycardia with rate 106 with possible left atrial enlargement, left axis deviation, incomplete right bundle branch block and LVH with repolarization abnormality, likely the reason for 2 mm anterolateral ST segment elevation with inferolateral T wave inversion. Repeat EKG showed normal sinus rhythm with a rate of 94 with PACs, probable left atrial enlargement, redemonstrated LVH with secondary repolarization abnormality and an inferolateral ST segment depression.  Patient was started on IV heparin, cardiology consulted.  He recommended medical management and IV heparin was continued for 48 hours before transitioning to Eliquis and Plavix.

## 2021-04-26 NOTE — Progress Notes (Signed)
ANTICOAGULATION CONSULT NOTE   Pharmacy Consult for Heparin  Indication: chest pain/ACS and atrial fibrillation  Allergies  Allergen Reactions   Cephalexin Hives   Hydralazine Hives     Vital Signs: Temp: 98.6 F (37 C) (02/27 1112) Temp Source: Oral (02/27 1112) BP: 121/73 (02/27 1112) Pulse Rate: 89 (02/27 1112)  Labs: Recent Labs    04/25/21 0251 04/25/21 0603 04/25/21 1427 04/25/21 1427 04/26/21 0050 04/26/21 1034 04/26/21 2001  HGB 12.6*  --   --   --  10.1*  --   --   HCT 38.9*  --   --   --  31.7*  --   --   PLT 202  --   --   --  145*  --   --   APTT  --   --  33  --   --   --   --   HEPARINUNFRC  --   --  <0.10*   < > 0.10* 0.12* 0.38  CREATININE 4.01*  --   --   --  4.95*  --   --   TROPONINIHS 197* 1,539*  --   --   --   --   --    < > = values in this interval not displayed.     Estimated Creatinine Clearance: 11.8 mL/min (A) (by C-G formula based on SCr of 4.95 mg/dL (H)).   Medical History: Past Medical History:  Diagnosis Date   A-fib ([place]) 09/18/2020   AAA (abdominal aortic aneurysm)    5.3cm , magd by vascular Dr Scot Dock ,   Anemia    " slightly"   Aortic stenosis    Arthritis    knees   Ascites    Cancer ([place])    prostate   Chronic kidney disease    stage 4, mgd by Dr Justin Mend    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[facility])    Coronary artery disease    on CT scan   Gout    H/O hypercholesterolemia    Heart murmur    History of falling    recent- see ct chest results of 04/25/2018   Hypertension    Hypothyroidism    Liver cirrhosis ([place])    Prediabetes    pt denies    Renal artery stenosis (H[facility])    Thrombocytopenia (H[facility])    Wears glasses    Wears glasses       Assessment: 81 y/o M with left arm/neck pain. Starting heparin for r/o ACS. On Apixaban PTA for afib (Last dose of Apixaban was 2/25 AM)  Heparin level therapeutic this evening at 0.38 units/mL This morning hgb 12.6> 10.1 (+ 2.2 L).  No bleeding or infusion issues  noted.   Goal of Therapy:  Heparin level 0.3-0.7 units/mL Monitor platelets by anticoagulation protocol: Yes   Plan:  -Continue Heparin at 1650 un/hr -Heparin level in 8 hours and daily with CBC.   Thank you for allowing [place] to participate in this patients care. Jens Som, PharmD 04/26/2021 8:51 PM  **Pharmacist phone directory can be found on [email or web address] listed under [place]**

## 2021-04-26 NOTE — Progress Notes (Addendum)
Progress Note  Patient Name: Ricky Harris Date of Encounter: 04/26/2021  [facility] Cardiologist: Peter Martinique, MD   Subjective   Patient denied any chest pain, SOB, palpitations, swelling in ankles. Usually goes to HD on tuesdays, thursdays, and saturdays   Inpatient Medications    Scheduled Meds:  aspirin EC  81 mg Oral Daily   atorvastatin  10 mg Oral Daily   calcium acetate  667 mg Oral TID WC   clopidogrel  75 mg Oral Daily   febuxostat  40 mg Oral Daily   insulin aspart  0-9 Units Subcutaneous TID AC & HS   isosorbide mononitrate  15 mg Oral Daily   lactulose  10 g Oral Daily   metoprolol succinate  25 mg Oral Daily   multivitamin  1 tablet Oral Daily   spironolactone  12.5 mg Oral Daily   Continuous Infusions:  sodium chloride 75 mL/hr at 04/26/21 0446   heparin 1,450 Units/hr (04/26/21 0446)   PRN Meds: acetaminophen, ALPRAZolam, morphine injection, nitroGLYCERIN, ondansetron (ZOFRAN) IV, traZODone   Vital Signs    Vitals:   04/25/21 1608 04/25/21 2100 04/25/21 2348 04/26/21 0356  BP: (!) 115/59 (!) 105/57 106/62 105/62  Pulse:  71 81 78  Resp: 16 16 16 16   Temp: 98.1 F (36.7 C) 98 F (36.7 C) 98.9 F (37.2 C) 98.9 F (37.2 C)  TempSrc: Oral Oral Oral Oral  SpO2: 99% 99% 97% 100%  Weight:      Height:        Intake/Output Summary (Last 24 hours) at 04/26/2021 0937 Last data filed at 04/26/2021 0446 Gross per 24 hour  Intake 2219.41 ml  Output --  Net 2219.41 ml   Last 3 Weights 04/25/2021 09/23/2020 09/22/2020  Weight (lbs) 151 lb 10.8 oz 158 lb 4.8 oz 162 lb 7.7 oz  Weight (kg) 68.8 kg 71.804 kg 73.7 kg      Telemetry    Sinus rhythm - Personally Reviewed  ECG    Normal sinus rhythm, LAD, LVH - Personally Reviewed  Physical Exam   GEN: No acute distress. Chronically ill  Neck: No JVD Cardiac: RRR, no murmurs, rubs, or gallops. Radial pulses 2+ bilaterally  Respiratory: Clear to auscultation bilaterally. GI: Soft, nontender,  non-distended  MS: No edema; No deformity. Neuro:  Nonfocal  Psych: Normal affect   Labs    High Sensitivity Troponin:   Recent Labs  Lab 04/25/21 0251 04/25/21 0603  TROPONINIHS 197* 1,539*     Chemistry Recent Labs  Lab 04/25/21 0251 04/26/21 0050  NA 138 137  K 3.8 4.1  CL 96* 100  CO2 30 23  GLUCOSE 149* 124*  BUN 39* 58*  CREATININE 4.01* 4.95*  CALCIUM 9.3 8.0*  GFRNONAA 14* 11*  ANIONGAP 12 14    Lipids No results for input(s): CHOL, TRIG, HDL, LABVLDL, LDLCALC, CHOLHDL in the last 168 hours.  Hematology Recent Labs  Lab 04/25/21 0251 04/26/21 0050  WBC 12.3* 9.2  RBC 3.90* 3.15*  HGB 12.6* 10.1*  HCT 38.9* 31.7*  MCV 99.7 100.6*  MCH 32.3 32.1  MCHC 32.4 31.9  RDW 15.1 15.0  PLT 202 145*   Thyroid No results for input(s): TSH, FREET4 in the last 168 hours.  BNPNo results for input(s): BNP, PROBNP in the last 168 hours.  DDimer No results for input(s): DDIMER in the last 168 hours.   Radiology    DG Chest Port 1 View  Result Date: 04/25/2021 CLINICAL DATA:  Chest  pain. EXAM: PORTABLE CHEST 1 VIEW COMPARISON:  Chest radiograph dated 09/15/2020. FINDINGS: Cardiomegaly. No focal consolidation, pleural effusion or pneumothorax. Atherosclerotic calcification of the aorta. No acute osseous pathology. IMPRESSION: No acute cardiopulmonary process. Cardiomegaly. Electronically Signed   By: Anner Crete M.D.   On: 04/25/2021 03:07   ECHOCARDIOGRAM COMPLETE  Result Date: 04/25/2021    ECHOCARDIOGRAM REPORT   Patient Name:   Ricky Harris Date of Exam: 04/25/2021 Medical Rec #:  [id number]      Height:       72.0 in Accession #:    [id number]     Weight:       151.7 lb Date of Birth:  May 27, 1940     BSA:          1.894 m Patient Age:    81 years       BP:           115/59 mmHg Patient Gender: M              HR:           91 bpm. Exam Location:  Inpatient Procedure: 2D Echo Indications:    NSTEMI  History:        Patient has prior history of Echocardiogram  examinations, most                 recent 09/16/2020. End stage renal disease. Cirrhosis,                 Arrythmias:Paroxysmal A-Fib; Risk Factors:Hypertension and                 Dyslipidemia.  Sonographer:    Johny Chess RDCS Referring Phys: [id number] JAN A [place]  1. Left ventricular ejection fraction, by estimation, is 25 to 30%. The left ventricle has severely decreased function. The left ventricle demonstrates global hypokinesis. There is mild left ventricular hypertrophy of the basal-septal segment. Left ventricular diastolic parameters are consistent with Grade II diastolic dysfunction (pseudonormalization).  2. Right ventricular systolic function is normal. The right ventricular size is normal. Tricuspid regurgitation signal is inadequate for assessing PA pressure.  3. Left atrial size was severely dilated.  4. Right atrial size was mildly dilated.  5. The mitral valve is normal in structure. Mild mitral valve regurgitation. No evidence of mitral stenosis.  6. The aortic valve is calcified. There is severe calcifcation of the aortic valve. There is severe thickening of the aortic valve. Aortic valve regurgitation is not visualized. Aortic valve sclerosis/calcification is present, without any evidence of aortic stenosis. Aortic valve area, by VTI measures 1.71 cm. Aortic valve mean gradient measures 11.0 mmHg. Aortic valve Vmax measures 2.25 m/s.  7. Aortic dilatation noted. There is mild dilatation of the ascending aorta, measuring 40 mm.  8. The inferior vena cava is normal in size with <50% respiratory variability, suggesting right atrial pressure of 8 mmHg. FINDINGS  Left Ventricle: Left ventricular ejection fraction, by estimation, is 25 to 30%. The left ventricle has severely decreased function. The left ventricle demonstrates global hypokinesis. The left ventricular internal cavity size was normal in size. There is mild left ventricular hypertrophy of the basal-septal segment. Left  ventricular diastolic parameters are consistent with Grade II diastolic dysfunction (pseudonormalization). Normal left ventricular filling pressure. Right Ventricle: The right ventricular size is normal. No increase in right ventricular wall thickness. Right ventricular systolic function is normal. Tricuspid regurgitation signal is inadequate for assessing PA pressure. Left Atrium: Left atrial size was severely  dilated. Right Atrium: Right atrial size was mildly dilated. Pericardium: There is no evidence of pericardial effusion. Mitral Valve: The mitral valve is normal in structure. Mild mitral valve regurgitation. No evidence of mitral valve stenosis. Tricuspid Valve: The tricuspid valve is normal in structure. Tricuspid valve regurgitation is mild . No evidence of tricuspid stenosis. Aortic Valve: The aortic valve is calcified. There is severe calcifcation of the aortic valve. There is severe thickening of the aortic valve. Aortic valve regurgitation is not visualized. Aortic valve sclerosis/calcification is present, without any evidence of aortic stenosis. Aortic valve mean gradient measures 11.0 mmHg. Aortic valve peak gradient measures 20.2 mmHg. Aortic valve area, by VTI measures 1.71 cm. Pulmonic Valve: The pulmonic valve was normal in structure. Pulmonic valve regurgitation is not visualized. No evidence of pulmonic stenosis. Aorta: Aortic dilatation noted. There is mild dilatation of the ascending aorta, measuring 40 mm. Venous: The inferior vena cava is normal in size with less than 50% respiratory variability, suggesting right atrial pressure of 8 mmHg. IAS/Shunts: No atrial level shunt detected by color flow Doppler.  LEFT VENTRICLE PLAX 2D LVIDd:         5.50 cm      Diastology LVIDs:         4.80 cm      LV e' medial:    4.24 cm/s LV PW:         1.10 cm      LV E/e' medial:  20.2 LV IVS:        1.20 cm      LV e' lateral:   12.00 cm/s LVOT diam:     2.10 cm      LV E/e' lateral: 7.1 LV SV:         69  LV SV Index:   36 LVOT Area:     3.46 cm  LV Volumes (MOD) LV vol d, MOD A2C: 187.0 ml LV vol d, MOD A4C: 185.0 ml LV vol s, MOD A2C: 120.0 ml LV vol s, MOD A4C: 121.0 ml LV SV MOD A2C:     67.0 ml LV SV MOD A4C:     185.0 ml LV SV MOD BP:      69.3 ml RIGHT VENTRICLE             IVC RV S prime:     12.80 cm/s  IVC diam: 2.10 cm TAPSE (M-mode): 2.6 cm LEFT ATRIUM             Index        RIGHT ATRIUM           Index LA diam:        5.60 cm 2.96 cm/m   RA Area:     22.60 cm LA Vol (A2C):   88.6 ml 46.77 ml/m  RA Volume:   69.20 ml  36.53 ml/m LA Vol (A4C):   92.5 ml 48.83 ml/m LA Biplane Vol: 94.1 ml 49.68 ml/m  AORTIC VALVE AV Area (Vmax):    1.64 cm AV Area (Vmean):   1.62 cm AV Area (VTI):     1.71 cm AV Vmax:           225.00 cm/s AV Vmean:          159.000 cm/s AV VTI:            0.402 m AV Peak Grad:      20.2 mmHg AV Mean Grad:      11.0 mmHg LVOT Vmax:  106.50 cm/s LVOT Vmean:        74.400 cm/s LVOT VTI:          0.198 m LVOT/AV VTI ratio: 0.49  AORTA Ao Root diam: 3.50 cm Ao Asc diam:  4.00 cm MITRAL VALVE MV Area (PHT): 4.29 cm    SHUNTS MV Decel Time: 177 msec    Systemic VTI:  0.20 m MV E velocity: 85.70 cm/s  Systemic Diam: 2.10 cm MV A velocity: 94.50 cm/s MV E/A ratio:  0.91 Fransico Him MD Electronically signed by Fransico Him MD Signature Date/Time: 04/25/2021/5:11:19 PM    Final     Cardiac Studies   Echo 04/25/2021   1. Left ventricular ejection fraction, by estimation, is 25 to 30%. The  left ventricle has severely decreased function. The left ventricle  demonstrates global hypokinesis. There is mild left ventricular  hypertrophy of the basal-septal segment. Left  ventricular diastolic parameters are consistent with Grade II diastolic  dysfunction (pseudonormalization).   2. Right ventricular systolic function is normal. The right ventricular  size is normal. Tricuspid regurgitation signal is inadequate for assessing  PA pressure.   3. Left atrial size was severely  dilated.   4. Right atrial size was mildly dilated.   5. The mitral valve is normal in structure. Mild mitral valve  regurgitation. No evidence of mitral stenosis.   6. The aortic valve is calcified. There is severe calcifcation of the  aortic valve. There is severe thickening of the aortic valve. Aortic valve  regurgitation is not visualized. Aortic valve sclerosis/calcification is  present, without any evidence of  aortic stenosis. Aortic valve area, by VTI measures 1.71 cm. Aortic valve  mean gradient measures 11.0 mmHg. Aortic valve Vmax measures 2.25 m/s.   7. Aortic dilatation noted. There is mild dilatation of the ascending  aorta, measuring 40 mm.   8. The inferior vena cava is normal in size with <50% respiratory  variability, suggesting right atrial pressure of 8 mmHg.   Left and Right Heart Cath 09/18/2020   Prox LAD to Mid LAD lesion is 85% stenosed.   Mid LAD lesion is 50% stenosed.   Dist LAD lesion is 20% stenosed.   Prox Cx to Mid Cx lesion is 65% stenosed.   Prox RCA lesion is 95% stenosed.   Mid RCA lesion is 90% stenosed.   LV end diastolic pressure is mildly elevated.   Hemodynamic findings consistent with mild pulmonary hypertension.   There is severe coronary calcification involving the LAD and right coronary artery with mild calcification of the left circumflex vessel.   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   Mild right heart pressure elevation with mild pulmonary hypertension.   RECOMMENDATION: The patient has severe coronary calcification and three-vessel disease.  Echo Doppler has demonstrated reduced  EF in the 25 to 30% range.  Recommend initial surgical consultation for consideration of possible CABG revascularization surgery.  However with the patient's significant comorbidities, if surgery is excluded, will need to review with colleagues regarding potential orbital atherectomy and intervention into the LAD and RCA.  We will consider reinstitution of low-dose heparin much later today and this patient with recent PAF and ulcerated plaque  in the proximal RCA. Diagnostic Dominance: Co-dominant   Patient Profile     81 y.o. male with severe multivessel CAD not amenable to either CABG or PCI 2/2 comorbidities, pAF, ESRD, HTN, Hx AAA s/p endovascular repair (patent aortic stent graft with 6.1 cm native sac diameter), and As TAA who was seen for the evaluation of ACS at the request of Dr. Ralene Bathe  Assessment & Plan    NSTEMI  - Patient with typical story for anginal chest pain along with ECG findings of inferior lateral ST depressions and elevated troponin all consistent with NSTEMI.  Treating medically as he is not candidate for additional interventions based off discussion with interventional cardiology, review of his prior cath, and comorbidities   - Continue IV heparin for 48 hours (to end tomorrow AM)  - Continue daily asa, statin, metoprolol, imdur   - Patient is suppose to be taking eliquis at home, but has not been taking. Since we are not planning on cardiac cath, we will continue DAPT until transitioning back to apixaban/plavix prior to discharge.   Chronic combined systolic/diastolic HF  - Echo this admission showed EF 25-30%, grade II diastolic dysfunction  - Patient euvolemic on exam  - Continue aldactone  - Continue metoprolol (may need to D/C if patient develops shock, but currently BP is stable)  - Historically, GDMT has been limited due to ESRD, soft BP  Paroxysmal Atrial fibrillation  - Maintaining sinus rhythm per telemetry  - On IV heparin, metoprolol  - Prescribed eliquis  at home (reports not taking), will restart once heparin complete   ESRD  - On dialysis TTS - Nephrology to see while inpatient   Cirrhosis of liver  Known gastric varices  - Managed per primary, on aldactone and lactulose      For questions or updates, please contact [facility] Please consult [email or web address] for contact info under        Signed, Margie Billet, PA-C  04/26/2021, 9:37 AM     Patient seen and examined. Agree with assessment and plan. Currently patient is pain free. HR in the 90s. BP 110/60. No JVD, no rales, 2/6 systolic murmur, no HJR, no edema.  Will try to titrate metoprolol succinate to 37.5 mg as BP allows. He continues to be on heparin. With plan to initiate eliquis tomorrow following dc heparin, should also dc ASA once eliquis resumed.  Mild macrocytic anemia with MCV 100.6.    Troy Sine, MD, [facility] 04/26/2021 12:09 PM

## 2021-04-26 NOTE — Progress Notes (Signed)
ANTICOAGULATION CONSULT NOTE   Pharmacy Consult for Heparin  Indication: chest pain/ACS and atrial fibrillation  Allergies  Allergen Reactions   Cephalexin Hives   Hydralazine Hives     Vital Signs: Temp: 98.6 F (37 C) (02/27 1112) Temp Source: Oral (02/27 1112) BP: 121/73 (02/27 1112) Pulse Rate: 89 (02/27 1112)  Labs: Recent Labs    04/25/21 0251 04/25/21 0603 04/25/21 1427 04/26/21 0050 04/26/21 1034  HGB 12.6*  --   --  10.1*  --   HCT 38.9*  --   --  31.7*  --   PLT 202  --   --  145*  --   APTT  --   --  33  --   --   HEPARINUNFRC  --   --  <0.10* 0.10* 0.12*  CREATININE 4.01*  --   --  4.95*  --   TROPONINIHS 197* 1,539*  --   --   --      Estimated Creatinine Clearance: 11.8 mL/min (A) (by C-G formula based on SCr of 4.95 mg/dL (H)).   Medical History: Past Medical History:  Diagnosis Date   A-fib ([place]) 09/18/2020   AAA (abdominal aortic aneurysm)    5.3cm , magd by vascular Dr Scot Dock ,   Anemia    " slightly"   Aortic stenosis    Arthritis    knees   Ascites    Cancer ([place])    prostate   Chronic kidney disease    stage 4, mgd by Dr Justin Mend    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[facility])    Coronary artery disease    on CT scan   Gout    H/O hypercholesterolemia    Heart murmur    History of falling    recent- see ct chest results of 04/25/2018   Hypertension    Hypothyroidism    Liver cirrhosis ([place])    Prediabetes    pt denies    Renal artery stenosis ([facility])    Thrombocytopenia ([facility])    Wears glasses    Wears glasses       Assessment: 81 y/o M with left arm/neck pain. Starting heparin for r/o ACS. On Apixaban PTA for afib (Last dose of Apixaban was 2/25 AM) -heparin level = 0.12, hg 12.6> 10.1 (+ 2.2 L)    Goal of Therapy:  Heparin level 0.3-0.7 units/mL Monitor platelets by anticoagulation protocol: Yes   Plan:  -Heparin 2000 unit bolus and increase to 1650/hr -Heparin level in 8 hours and daily wth CBC daily  Hildred Laser, PharmD Clinical Pharmacist **Pharmacist phone directory can now be found on [email or web address] ([email or web address]).  Listed under [place].

## 2021-04-27 ENCOUNTER — Other Ambulatory Visit (HOSPITAL_COMMUNITY): Payer: Self-pay

## 2021-04-27 ENCOUNTER — Encounter (HOSPITAL_COMMUNITY): Payer: Self-pay

## 2021-04-27 DIAGNOSIS — I480 Paroxysmal atrial fibrillation: Secondary | ICD-10-CM

## 2021-04-27 DIAGNOSIS — Z7901 Long term (current) use of anticoagulants: Secondary | ICD-10-CM

## 2021-04-27 DIAGNOSIS — E440 Moderate protein-calorie malnutrition: Secondary | ICD-10-CM | POA: Insufficient documentation

## 2021-04-27 LAB — BASIC METABOLIC PANEL
Anion gap: 17 — ABNORMAL HIGH (ref 5–15)
BUN: 84 mg/dL — ABNORMAL HIGH (ref 8–23)
CO2: 20 mmol/L — ABNORMAL LOW (ref 22–32)
Calcium: 7.8 mg/dL — ABNORMAL LOW (ref 8.9–10.3)
Chloride: 99 mmol/L (ref 98–111)
Creatinine, Ser: 6.55 mg/dL — ABNORMAL HIGH (ref 0.61–1.24)
GFR, Estimated: 8 mL/min — ABNORMAL LOW (ref >60)
Glucose, Bld: 101 mg/dL — ABNORMAL HIGH (ref 70–99)
Potassium: 4.3 mmol/L (ref 3.5–5.1)
Sodium: 136 mmol/L (ref 135–145)

## 2021-04-27 LAB — CBC
HCT: 31.2 % — ABNORMAL LOW (ref 39.0–52.0)
Hemoglobin: 10.1 g/dL — ABNORMAL LOW (ref 13.0–17.0)
MCH: 32 pg (ref 26.0–34.0)
MCHC: 32.4 g/dL (ref 30.0–36.0)
MCV: 98.7 fL (ref 80.0–100.0)
Platelets: 144 10*3/uL — ABNORMAL LOW (ref 150–400)
RBC: 3.16 MIL/uL — ABNORMAL LOW (ref 4.22–5.81)
RDW: 15.2 % (ref 11.5–15.5)
WBC: 11.1 10*3/uL — ABNORMAL HIGH (ref 4.0–10.5)
nRBC: 0.2 % (ref 0.0–0.2)

## 2021-04-27 LAB — GLUCOSE, CAPILLARY
Glucose-Capillary: 133 mg/dL — ABNORMAL HIGH (ref 70–99)
Glucose-Capillary: 101 mg/dL — ABNORMAL HIGH (ref 70–99)
Glucose-Capillary: 74 mg/dL (ref 70–99)
Glucose-Capillary: 122 mg/dL — ABNORMAL HIGH (ref 70–99)

## 2021-04-27 LAB — HEPATITIS B SURFACE ANTIBODY, QUANTITATIVE: Hep B S AB Quant (Post): >1000 m[IU]/mL (ref >9.9)

## 2021-04-27 LAB — PHOSPHORUS: Phosphorus: 5.7 mg/dL — ABNORMAL HIGH (ref 2.5–4.6)

## 2021-04-27 MED ORDER — METOPROLOL SUCCINATE ER 25 MG PO TB24
37.5000 mg | ORAL_TABLET | Freq: Every day | ORAL | 1 refills | Status: DC
  Filled 2021-04-27: qty 45, 30d supply, fill #0
  Filled ????-??-??: fill #0

## 2021-04-27 MED ORDER — ATORVASTATIN CALCIUM 40 MG PO TABS
40.0000 mg | ORAL_TABLET | Freq: Every day | ORAL | Status: DC
  Administered 2021-04-27: 40 mg via ORAL
  Filled 2021-04-27: qty 1

## 2021-04-27 MED ORDER — ISOSORBIDE MONONITRATE ER 30 MG PO TB24
15.0000 mg | ORAL_TABLET | Freq: Every day | ORAL | 1 refills | Status: DC
  Filled 2021-04-27: qty 30, 60d supply, fill #0
  Filled ????-??-??: fill #0

## 2021-04-27 MED ORDER — CLOPIDOGREL BISULFATE 75 MG PO TABS
75.0000 mg | ORAL_TABLET | Freq: Every day | ORAL | 1 refills | Status: DC
  Filled 2021-04-27: qty 30, 30d supply, fill #0
  Filled ????-??-??: fill #0

## 2021-04-27 MED ORDER — APIXABAN 5 MG PO TABS
5.0000 mg | ORAL_TABLET | Freq: Two times a day (BID) | ORAL | 1 refills | Status: DC
  Filled 2021-04-27: qty 60, 30d supply, fill #0
  Filled ????-??-??: fill #0

## 2021-04-27 MED ORDER — NEPRO/CARBSTEADY PO LIQD: 237.0000 mL | Freq: Two times a day (BID) | ORAL | Status: DC

## 2021-04-27 MED ORDER — SPIRONOLACTONE 25 MG PO TABS
12.5000 mg | ORAL_TABLET | Freq: Every day | ORAL | 0 refills | Status: DC
  Filled 2021-04-27: qty 30, 60d supply, fill #0

## 2021-04-27 MED ORDER — LEVOTHYROXINE SODIUM 50 MCG PO TABS
50.0000 ug | ORAL_TABLET | Freq: Every day | ORAL | 1 refills | Status: AC
  Filled 2021-04-27: qty 30, 30d supply, fill #0
  Filled ????-??-??: fill #0

## 2021-04-27 MED ORDER — APIXABAN 5 MG PO TABS
5.0000 mg | ORAL_TABLET | Freq: Two times a day (BID) | ORAL | Status: DC
Start: 2021-04-27 — End: 2021-04-27
  Administered 2021-04-27: 5 mg via ORAL
  Filled 2021-04-27: qty 1

## 2021-04-27 MED ORDER — ATORVASTATIN CALCIUM 40 MG PO TABS
40.0000 mg | ORAL_TABLET | Freq: Every day | ORAL | 1 refills | Status: AC
  Filled 2021-04-27: qty 30, 30d supply, fill #0
  Filled ????-??-??: fill #0

## 2021-04-27 NOTE — Progress Notes (Signed)
D/C order noted. Contacted [place] to make clinic aware of pt's d/c today and pt will resume care on Thursday.   Melven Sartorius Renal Navigator [phone]

## 2021-04-27 NOTE — Progress Notes (Signed)
Initial Nutrition Assessment  DOCUMENTATION CODES:  Non-severe (moderate) malnutrition in context of chronic illness  INTERVENTION:  Continue current diet as ordered, encourage PO intake Renavite daily Nepro Shake po BID, each supplement provides 425 kcal and 19 grams protein  NUTRITION DIAGNOSIS:  Moderate Malnutrition related to chronic illness (ESRD on HD) as evidenced by mild fat depletion, severe muscle depletion.  GOAL:  Patient will meet greater than or equal to 90% of their needs  MONITOR:  PO intake, Labs, I & O's, Supplement acceptance  REASON FOR ASSESSMENT:  Malnutrition Screening Tool    ASSESSMENT:  81 y.o. male with hx of HTN, hypothyroidism, cirrhosis, gout, HLD, hx of prostate cancer, CAD, atrial fibrillation, and ESRD on HD presented to ED with severe chest tightness. Pt non-compliant with medications at home. Found to have NSTEMI  Pt resting in bed at the time of assessment. Reports appetite is good this AM and essentially at baseline. Pt reports that at home he eats 2x/d, normally does not have breakfast. For lunch he will have a frozen meal, and then his son prepares dinner (meat with sides). Pt does endorse less intake on days with HD. Also reports he no longer makes urine.  Pt unsure of usual weight, but does not think it has changed substantially based on how his clothes fit recently.  Pt thin on exam with muscle and fat deficits noted. Inquired about nutrition shakes at home. Pt reports that he was told not to consume ensure as it was high in K and phosphorus. Pt is agreeable to receiving nepro though. Prefers berry flavors.  EDW per outpatient nephrology note - 69kg  Average Meal Intake: 2/26: 50% intake x 1 recorded meals  Nutritionally Relevant Medications: Scheduled Meds:  atorvastatin  40 mg Oral Daily   calcium acetate  667 mg Oral TID WC   clopidogrel  75 mg Oral Daily   febuxostat  40 mg Oral Daily   insulin aspart  0-9 Units Subcutaneous  TID AC & HS   isosorbide mononitrate  15 mg Oral Daily   lactulose  10 g Oral Daily   multivitamin  1 tablet Oral Daily   spironolactone  12.5 mg Oral Daily   PRN Meds: ondansetron  Labs Reviewed: BUN 84, creatinine 6.55 Phosphorus 5.7  SBG ranges from 101-164 mg/dL over the last 24 hours HgbA1c 4.5%  NUTRITION - FOCUSED PHYSICAL EXAM: Flowsheet Row Most Recent Value  Orbital Region Mild depletion  Upper Arm Region Mild depletion  Thoracic and Lumbar Region Moderate depletion  Buccal Region Mild depletion  Temple Region Mild depletion  Clavicle Bone Region Moderate depletion  Clavicle and Acromion Bone Region Moderate depletion  Scapular Bone Region Moderate depletion  Dorsal Hand Mild depletion  Patellar Region Severe depletion  Anterior Thigh Region Severe depletion  Posterior Calf Region Severe depletion  Edema (RD Assessment) None  Hair Reviewed  Eyes Reviewed  Mouth Reviewed  Skin Reviewed  Nails Reviewed   Diet Order:   Diet Order             Diet Heart Room service appropriate? Yes; Fluid consistency: Thin  Diet effective now                   EDUCATION NEEDS:  Education needs have been addressed  Skin:  Skin Assessment: Reviewed RN Assessment  Last BM:  2/25  Height:  Ht Readings from Last 1 Encounters:  04/25/21 6' (1.829 m)    Weight:  Wt Readings from Last  1 Encounters:  04/25/21 68.8 kg    Ideal Body Weight:  80.9 kg  BMI:  Body mass index is 20.57 kg/m.  Estimated Nutritional Needs:  Kcal:  2000-2200 kcal/d Protein:  100-120 g/d Fluid:  1L+UOP daily   Ranell Patrick, RD, LDN Clinical Dietitian RD pager # available in [place]  After hours/weekend pager # available in [facility]

## 2021-04-27 NOTE — Discharge Summary (Signed)
Physician Discharge Summary   Patient: Ricky Harris MRN: [id number] DOB: 29-May-1940  Admit date:     04/25/2021  Discharge date: 04/27/21  Discharge Physician: Dessa Phi   PCP: [name]., MD   Recommendations at discharge:   Follow-up with PCP and cardiology as scheduled  Discharge Diagnoses: Principal Problem:   NSTEMI (non-ST elevated myocardial infarction) ([place]) Active Problems:   End-stage renal disease on hemodialysis ([place])   Essential hypertension   HLD (hyperlipidemia)   Hypothyroidism   Cirrhosis of liver (HCC)   GERD without esophagitis   Paroxysmal atrial fibrillation (HCC)   Malnutrition of moderate degree  Resolved Problems:   * No resolved hospital problems. *   Hospital Course: Ricky Harris is a 81 y.o. Caucasian male with medical history significant for end-stage renal disease on hemodialysis, coronary artery disease, dyslipidemia, hypertension, hypothyroidism, liver cirrhosis, prediabetes, atrial fibrillation, AAA, aortic stenosis, gout and osteoarthritis, who presented to the ER with acute onset of central chest pain felt this tightness and graded severe intensity 8/10 which started around 6 PM with radiation to his left arm.  EKG showed sinus tachycardia with rate 106 with possible left atrial enlargement, left axis deviation, incomplete right bundle branch block and LVH with repolarization abnormality, likely the reason for 2 mm anterolateral ST segment elevation with inferolateral T wave inversion. Repeat EKG showed normal sinus rhythm with a rate of 94 with PACs, probable left atrial enlargement, redemonstrated LVH with secondary repolarization abnormality and an inferolateral ST segment depression.  Patient was started on IV heparin, cardiology consulted.  He recommended medical management and IV heparin was continued for 48 hours before transitioning to Eliquis and Plavix.    Assessment and Plan: * NSTEMI (non-ST elevated myocardial infarction)  [facility])- (present on admission) [place] cardiology consultation, recommended for medical management Eliquis, Plavix, statin, metoprolol, Imdur   End-stage renal disease on hemodialysis [facility]) Nephrology following.  Patient is on dialysis Tuesday, Thursday, Saturday  Paroxysmal atrial fibrillation (HCC) Eliquis, metoprolol  GERD without esophagitis- (present on admission) PPI  Cirrhosis of liver ([place])- (present on admission) Continue Aldactone  Hypothyroidism- (present on admission) Synthroid  HLD (hyperlipidemia)- (present on admission) Statin   Essential hypertension- (present on admission) Continue clonidine, metoprolol, Aldactone           Consultants: Cardiology, nephrology Procedures performed: None Disposition: Home Diet recommendation:  Cardiac diet  DISCHARGE MEDICATION: Allergies as of 04/27/2021       Reactions   Cephalexin Hives   Hydralazine Hives        Medication List     STOP taking these medications    aspirin EC 81 MG tablet   carvedilol 12.5 MG tablet Commonly known as: COREG   febuxostat 40 MG tablet Commonly known as: ULORIC   lactulose 10 GM/15ML solution Commonly known as: CHRONULAC       TAKE these medications    apixaban 5 MG Tabs tablet Commonly known as: ELIQUIS Take 1 tablet (5 mg total) by mouth 2 (two) times daily.   atorvastatin 40 MG tablet Commonly known as: LIPITOR Take 1 tablet (40 mg total) by mouth daily. Start taking on: April 28, 2021 What changed:  medication strength how much to take when to take this Another medication with the same name was removed. Continue taking this medication, and follow the directions you see here.   calcium acetate 667 MG capsule Commonly known as: PHOSLO Take 1 capsule (667 mg total) by mouth 3 (three) times daily with meals.  cetirizine 10 MG tablet Commonly known as: ZYRTEC Take 10 mg by mouth daily as needed for allergies.   clopidogrel 75 MG  tablet Commonly known as: PLAVIX Take 1 tablet (75 mg total) by mouth daily. Start taking on: April 28, 2021   isosorbide mononitrate 30 MG 24 hr tablet Commonly known as: IMDUR Take 0.5 tablets (15 mg total) by mouth daily.   levothyroxine 50 MCG tablet Commonly known as: SYNTHROID Take 1 tablet (50 mcg total) by mouth daily.   metoprolol succinate 25 MG 24 hr tablet Commonly known as: TOPROL-XL Take 1.5 tablets (37.5 mg total) by mouth daily. What changed: how much to take   multivitamin Tabs tablet Take 1 tablet by mouth daily.   spironolactone 25 MG tablet Commonly known as: ALDACTONE Take 0.5 tablets (12.5 mg total) by mouth daily.   traMADol 50 MG tablet Commonly known as: ULTRAM Take 1 tablet (50 mg total) by mouth every 8 (eight) hours as needed for moderate pain.        Follow-up Information     Deberah Pelton, NP Follow up on 05/13/2021.   Specialty: Cardiology Why: Appointment at 8:25 AM Contact information: [address] [place] [address] [phone]                 Discharge Exam: Danley Danker Weights   04/25/21 1200  Weight: 68.8 kg   Examination: General exam: Appears calm and comfortable  Respiratory system: Clear to auscultation. Respiratory effort normal. Cardiovascular system: S1 & S2 heard, RRR. No pedal edema. Gastrointestinal system: Abdomen is nondistended, soft and nontender. Normal bowel sounds heard. Central nervous system: Alert and oriented. Non focal exam. Speech clear  Extremities: Symmetric in appearance bilaterally  Skin: No rashes, lesions or ulcers on exposed skin  Psychiatry: Judgement and insight appear stable. Mood & affect appropriate.    Condition at discharge: stable  The results of significant diagnostics from this hospitalization (including imaging, microbiology, ancillary and laboratory) are listed below for reference.   Imaging Studies: DG Chest Port 1 View  Result Date:  04/25/2021 CLINICAL DATA:  Chest pain. EXAM: PORTABLE CHEST 1 VIEW COMPARISON:  Chest radiograph dated 09/15/2020. FINDINGS: Cardiomegaly. No focal consolidation, pleural effusion or pneumothorax. Atherosclerotic calcification of the aorta. No acute osseous pathology. IMPRESSION: No acute cardiopulmonary process. Cardiomegaly. Electronically Signed   By: Anner Crete M.D.   On: 04/25/2021 03:07   ECHOCARDIOGRAM COMPLETE  Result Date: 04/25/2021    ECHOCARDIOGRAM REPORT   Patient Name:   Ricky Harris Date of Exam: 04/25/2021 Medical Rec #:  [id number]      Height:       72.0 in Accession #:    [id number]     Weight:       151.7 lb Date of Birth:  1940/05/14     BSA:          1.894 m Patient Age:    19 years       BP:           115/59 mmHg Patient Gender: M              HR:           91 bpm. Exam Location:  Inpatient Procedure: 2D Echo Indications:    NSTEMI  History:        Patient has prior history of Echocardiogram examinations, most                 recent 09/16/2020. End  stage renal disease. Cirrhosis,                 Arrythmias:Paroxysmal A-Fib; Risk Factors:Hypertension and                 Dyslipidemia.  Sonographer:    Johny Chess RDCS Referring Phys: [id number] JAN A [place]  1. Left ventricular ejection fraction, by estimation, is 25 to 30%. The left ventricle has severely decreased function. The left ventricle demonstrates global hypokinesis. There is mild left ventricular hypertrophy of the basal-septal segment. Left ventricular diastolic parameters are consistent with Grade II diastolic dysfunction (pseudonormalization).  2. Right ventricular systolic function is normal. The right ventricular size is normal. Tricuspid regurgitation signal is inadequate for assessing PA pressure.  3. Left atrial size was severely dilated.  4. Right atrial size was mildly dilated.  5. The mitral valve is normal in structure. Mild mitral valve regurgitation. No evidence of mitral stenosis.  6. The aortic  valve is calcified. There is severe calcifcation of the aortic valve. There is severe thickening of the aortic valve. Aortic valve regurgitation is not visualized. Aortic valve sclerosis/calcification is present, without any evidence of aortic stenosis. Aortic valve area, by VTI measures 1.71 cm. Aortic valve mean gradient measures 11.0 mmHg. Aortic valve Vmax measures 2.25 m/s.  7. Aortic dilatation noted. There is mild dilatation of the ascending aorta, measuring 40 mm.  8. The inferior vena cava is normal in size with <50% respiratory variability, suggesting right atrial pressure of 8 mmHg. FINDINGS  Left Ventricle: Left ventricular ejection fraction, by estimation, is 25 to 30%. The left ventricle has severely decreased function. The left ventricle demonstrates global hypokinesis. The left ventricular internal cavity size was normal in size. There is mild left ventricular hypertrophy of the basal-septal segment. Left ventricular diastolic parameters are consistent with Grade II diastolic dysfunction (pseudonormalization). Normal left ventricular filling pressure. Right Ventricle: The right ventricular size is normal. No increase in right ventricular wall thickness. Right ventricular systolic function is normal. Tricuspid regurgitation signal is inadequate for assessing PA pressure. Left Atrium: Left atrial size was severely dilated. Right Atrium: Right atrial size was mildly dilated. Pericardium: There is no evidence of pericardial effusion. Mitral Valve: The mitral valve is normal in structure. Mild mitral valve regurgitation. No evidence of mitral valve stenosis. Tricuspid Valve: The tricuspid valve is normal in structure. Tricuspid valve regurgitation is mild . No evidence of tricuspid stenosis. Aortic Valve: The aortic valve is calcified. There is severe calcifcation of the aortic valve. There is severe thickening of the aortic valve. Aortic valve regurgitation is not visualized. Aortic valve  sclerosis/calcification is present, without any evidence of aortic stenosis. Aortic valve mean gradient measures 11.0 mmHg. Aortic valve peak gradient measures 20.2 mmHg. Aortic valve area, by VTI measures 1.71 cm. Pulmonic Valve: The pulmonic valve was normal in structure. Pulmonic valve regurgitation is not visualized. No evidence of pulmonic stenosis. Aorta: Aortic dilatation noted. There is mild dilatation of the ascending aorta, measuring 40 mm. Venous: The inferior vena cava is normal in size with less than 50% respiratory variability, suggesting right atrial pressure of 8 mmHg. IAS/Shunts: No atrial level shunt detected by color flow Doppler.  LEFT VENTRICLE PLAX 2D LVIDd:         5.50 cm      Diastology LVIDs:         4.80 cm      LV e' medial:    4.24 cm/s LV PW:  1.10 cm      LV E/e' medial:  20.2 LV IVS:        1.20 cm      LV e' lateral:   12.00 cm/s LVOT diam:     2.10 cm      LV E/e' lateral: 7.1 LV SV:         69 LV SV Index:   36 LVOT Area:     3.46 cm  LV Volumes (MOD) LV vol d, MOD A2C: 187.0 ml LV vol d, MOD A4C: 185.0 ml LV vol s, MOD A2C: 120.0 ml LV vol s, MOD A4C: 121.0 ml LV SV MOD A2C:     67.0 ml LV SV MOD A4C:     185.0 ml LV SV MOD BP:      69.3 ml RIGHT VENTRICLE             IVC RV S prime:     12.80 cm/s  IVC diam: 2.10 cm TAPSE (M-mode): 2.6 cm LEFT ATRIUM             Index        RIGHT ATRIUM           Index LA diam:        5.60 cm 2.96 cm/m   RA Area:     22.60 cm LA Vol (A2C):   88.6 ml 46.77 ml/m  RA Volume:   69.20 ml  36.53 ml/m LA Vol (A4C):   92.5 ml 48.83 ml/m LA Biplane Vol: 94.1 ml 49.68 ml/m  AORTIC VALVE AV Area (Vmax):    1.64 cm AV Area (Vmean):   1.62 cm AV Area (VTI):     1.71 cm AV Vmax:           225.00 cm/s AV Vmean:          159.000 cm/s AV VTI:            0.402 m AV Peak Grad:      20.2 mmHg AV Mean Grad:      11.0 mmHg LVOT Vmax:         106.50 cm/s LVOT Vmean:        74.400 cm/s LVOT VTI:          0.198 m LVOT/AV VTI ratio: 0.49  AORTA Ao Root  diam: 3.50 cm Ao Asc diam:  4.00 cm MITRAL VALVE MV Area (PHT): 4.29 cm    SHUNTS MV Decel Time: 177 msec    Systemic VTI:  0.20 m MV E velocity: 85.70 cm/s  Systemic Diam: 2.10 cm MV A velocity: 94.50 cm/s MV E/A ratio:  0.91 Fransico Him MD Electronically signed by Fransico Him MD Signature Date/Time: 04/25/2021/5:11:19 PM    Final     Microbiology: Results for orders placed or performed during the hospital encounter of 04/25/21  Resp Panel by RT-PCR (Flu A&B, Covid) Nasopharyngeal Swab     Status: None   Collection Time: 04/25/21  2:54 AM   Specimen: Nasopharyngeal Swab; Nasopharyngeal(NP) swabs in vial transport medium  Result Value Ref Range Status   SARS Coronavirus 2 by RT PCR NEGATIVE NEGATIVE Final    Comment: (NOTE) SARS-CoV-2 target nucleic acids are NOT DETECTED.  The SARS-CoV-2 RNA is generally detectable in upper respiratory specimens during the acute phase of infection. The lowest concentration of SARS-CoV-2 viral copies this assay can detect is 138 copies/mL. A negative result does not preclude SARS-Cov-2 infection and should not be used as the sole basis for  treatment or other patient management decisions. A negative result may occur with  improper specimen collection/handling, submission of specimen other than nasopharyngeal swab, presence of viral mutation(s) within the areas targeted by this assay, and inadequate number of viral copies(<138 copies/mL). A negative result must be combined with clinical observations, patient history, and epidemiological information. The expected result is Negative.  Fact Sheet for Patients:  EntrepreneurPulse.com.au  Fact Sheet for Healthcare Providers:  IncredibleEmployment.be  This test is no t yet approved or cleared by the [place] FDA and  has been authorized for detection and/or diagnosis of SARS-CoV-2 by FDA under an Emergency Use Authorization (EUA). This EUA will remain  in effect  (meaning this test can be used) for the duration of the COVID-19 declaration under Section 564(b)(1) of the Act, 21 U.S.C.section 360bbb-3(b)(1), unless the authorization is terminated  or revoked sooner.       Influenza A by PCR NEGATIVE NEGATIVE Final   Influenza B by PCR NEGATIVE NEGATIVE Final    Comment: (NOTE) The Xpert Xpress SARS-CoV-2/FLU/RSV plus assay is intended as an aid in the diagnosis of influenza from Nasopharyngeal swab specimens and should not be used as a sole basis for treatment. Nasal washings and aspirates are unacceptable for Xpert Xpress SARS-CoV-2/FLU/RSV testing.  Fact Sheet for Patients: EntrepreneurPulse.com.au  Fact Sheet for Healthcare Providers: IncredibleEmployment.be  This test is not yet approved or cleared by the [place] FDA and has been authorized for detection and/or diagnosis of SARS-CoV-2 by FDA under an Emergency Use Authorization (EUA). This EUA will remain in effect (meaning this test can be used) for the duration of the COVID-19 declaration under Section 564(b)(1) of the Act, 21 U.S.C. section 360bbb-3(b)(1), unless the authorization is terminated or revoked.  Performed at [facility], [place] [address], [place], [place] [address]     Labs: CBC: Recent Labs  Lab 04/25/21 0251 04/26/21 0050 04/27/21 0147  WBC 12.3* 9.2 11.1*  HGB 12.6* 10.1* 10.1*  HCT 38.9* 31.7* 31.2*  MCV 99.7 100.6* 98.7  PLT 202 145* 283*   Basic Metabolic Panel: Recent Labs  Lab 04/25/21 0251 04/26/21 0050 04/27/21 0147  NA 138 137 136  K 3.8 4.1 4.3  CL 96* 100 99  CO2 30 23 20*  GLUCOSE 149* 124* 101*  BUN 39* 58* 84*  CREATININE 4.01* 4.95* 6.55*  CALCIUM 9.3 8.0* 7.8*  PHOS  --   --  5.7*   Liver Function Tests: No results for input(s): AST, ALT, ALKPHOS, BILITOT, PROT, ALBUMIN in the last 168 hours. CBG: Recent Labs  Lab 04/26/21 1111 04/26/21 1703 04/26/21 2041 04/27/21 0745  04/27/21 1058  GLUCAP 164* 141* 114* 133* 101*    Discharge time spent: less than 30 minutes.  Signed: Dessa Phi, DO [facility] 04/27/2021

## 2021-04-27 NOTE — Progress Notes (Addendum)
Progress Note  Patient Name: Ricky Harris Date of Encounter: 04/27/2021  [facility] Cardiologist: Peter Martinique, MD   Subjective   Patient denies any chest pain, palpitations, SOB, swelling in his ankles. Is eager to go home. Denies any use of tobacco products. All questions answered.   Inpatient Medications    Scheduled Meds:  aspirin EC  81 mg Oral Daily   atorvastatin  10 mg Oral Daily   calcium acetate  667 mg Oral TID WC   clopidogrel  75 mg Oral Daily   febuxostat  40 mg Oral Daily   insulin aspart  0-9 Units Subcutaneous TID AC & HS   isosorbide mononitrate  15 mg Oral Daily   lactulose  10 g Oral Daily   metoprolol succinate  37.5 mg Oral Daily   multivitamin  1 tablet Oral Daily   spironolactone  12.5 mg Oral Daily   Continuous Infusions:  sodium chloride     sodium chloride     heparin 1,650 Units/hr (04/27/21 0652)   PRN Meds: sodium chloride, sodium chloride, acetaminophen, ALPRAZolam, alteplase, heparin, lidocaine (PF), lidocaine-prilocaine, morphine injection, nitroGLYCERIN, ondansetron (ZOFRAN) IV, pentafluoroprop-tetrafluoroeth, traZODone   Vital Signs    Vitals:   04/26/21 1052 04/26/21 1112 04/26/21 2038 04/27/21 0451  BP: 121/73 121/73 114/70 107/60  Pulse: 85 89 83 82  Resp:  16 15 16   Temp:  98.6 F (37 C) 98.1 F (36.7 C) (!) 97.3 F (36.3 C)  TempSrc:  Oral Oral Axillary  SpO2:  98% 97% 96%  Weight:      Height:        Intake/Output Summary (Last 24 hours) at 04/27/2021 0829 Last data filed at 04/27/2021 0541 Gross per 24 hour  Intake 894.79 ml  Output --  Net 894.79 ml   Last 3 Weights 04/25/2021 09/23/2020 09/22/2020  Weight (lbs) 151 lb 10.8 oz 158 lb 4.8 oz 162 lb 7.7 oz  Weight (kg) 68.8 kg 71.804 kg 73.7 kg      Telemetry    Sinus Rhythm, HR in the 70s-80s - Personally Reviewed  ECG    No new tracings  - Personally Reviewed  Physical Exam   GEN: No acute distress.  Sitting comfortably in the bed  Neck: No  JVD Cardiac: RRR. Grade 1/6 systolic murmur. No rubs or gallops.  Respiratory: Clear to auscultation bilaterally.  GI: Soft, nontender, non-distended  MS: No edema; No deformity. Neuro:  Nonfocal  Psych: Normal affect   Labs    High Sensitivity Troponin:   Recent Labs  Lab 04/25/21 0251 04/25/21 0603  TROPONINIHS 197* 1,539*     Chemistry Recent Labs  Lab 04/25/21 0251 04/26/21 0050 04/27/21 0147  NA 138 137 136  K 3.8 4.1 4.3  CL 96* 100 99  CO2 30 23 20*  GLUCOSE 149* 124* 101*  BUN 39* 58* 84*  CREATININE 4.01* 4.95* 6.55*  CALCIUM 9.3 8.0* 7.8*  GFRNONAA 14* 11* 8*  ANIONGAP 12 14 17*    Lipids No results for input(s): CHOL, TRIG, HDL, LABVLDL, LDLCALC, CHOLHDL in the last 168 hours.  Hematology Recent Labs  Lab 04/25/21 0251 04/26/21 0050 04/26/21 1241 04/27/21 0147  WBC 12.3* 9.2  --  11.1*  RBC 3.90* 3.15* 3.13* 3.16*  HGB 12.6* 10.1*  --  10.1*  HCT 38.9* 31.7*  --  31.2*  MCV 99.7 100.6*  --  98.7  MCH 32.3 32.1  --  32.0  MCHC 32.4 31.9  --  32.4  RDW 15.1 15.0  --  15.2  PLT 202 145*  --  144*   Thyroid No results for input(s): TSH, FREET4 in the last 168 hours.  BNPNo results for input(s): BNP, PROBNP in the last 168 hours.  DDimer No results for input(s): DDIMER in the last 168 hours.   Radiology    ECHOCARDIOGRAM COMPLETE  Result Date: 04/25/2021    ECHOCARDIOGRAM REPORT   Patient Name:   Ricky Harris Date of Exam: 04/25/2021 Medical Rec #:  [id number]      Height:       72.0 in Accession #:    [id number]     Weight:       151.7 lb Date of Birth:  [birth year]     BSA:          1.894 m Patient Age:    74 years       BP:           115/59 mmHg Patient Gender: M              HR:           91 bpm. Exam Location:  Inpatient Procedure: 2D Echo Indications:    NSTEMI  History:        Patient has prior history of Echocardiogram examinations, most                 recent 09/16/2020. End stage renal disease. Cirrhosis,                 Arrythmias:Paroxysmal  A-Fib; Risk Factors:Hypertension and                 Dyslipidemia.  Sonographer:    Johny Chess RDCS Referring Phys: [id number] JAN A [place]  1. Left ventricular ejection fraction, by estimation, is 25 to 30%. The left ventricle has severely decreased function. The left ventricle demonstrates global hypokinesis. There is mild left ventricular hypertrophy of the basal-septal segment. Left ventricular diastolic parameters are consistent with Grade II diastolic dysfunction (pseudonormalization).  2. Right ventricular systolic function is normal. The right ventricular size is normal. Tricuspid regurgitation signal is inadequate for assessing PA pressure.  3. Left atrial size was severely dilated.  4. Right atrial size was mildly dilated.  5. The mitral valve is normal in structure. Mild mitral valve regurgitation. No evidence of mitral stenosis.  6. The aortic valve is calcified. There is severe calcifcation of the aortic valve. There is severe thickening of the aortic valve. Aortic valve regurgitation is not visualized. Aortic valve sclerosis/calcification is present, without any evidence of aortic stenosis. Aortic valve area, by VTI measures 1.71 cm. Aortic valve mean gradient measures 11.0 mmHg. Aortic valve Vmax measures 2.25 m/s.  7. Aortic dilatation noted. There is mild dilatation of the ascending aorta, measuring 40 mm.  8. The inferior vena cava is normal in size with <50% respiratory variability, suggesting right atrial pressure of 8 mmHg. FINDINGS  Left Ventricle: Left ventricular ejection fraction, by estimation, is 25 to 30%. The left ventricle has severely decreased function. The left ventricle demonstrates global hypokinesis. The left ventricular internal cavity size was normal in size. There is mild left ventricular hypertrophy of the basal-septal segment. Left ventricular diastolic parameters are consistent with Grade II diastolic dysfunction (pseudonormalization). Normal left ventricular  filling pressure. Right Ventricle: The right ventricular size is normal. No increase in right ventricular wall thickness. Right ventricular systolic function is normal. Tricuspid regurgitation signal is inadequate for assessing PA pressure.  Left Atrium: Left atrial size was severely dilated. Right Atrium: Right atrial size was mildly dilated. Pericardium: There is no evidence of pericardial effusion. Mitral Valve: The mitral valve is normal in structure. Mild mitral valve regurgitation. No evidence of mitral valve stenosis. Tricuspid Valve: The tricuspid valve is normal in structure. Tricuspid valve regurgitation is mild . No evidence of tricuspid stenosis. Aortic Valve: The aortic valve is calcified. There is severe calcifcation of the aortic valve. There is severe thickening of the aortic valve. Aortic valve regurgitation is not visualized. Aortic valve sclerosis/calcification is present, without any evidence of aortic stenosis. Aortic valve mean gradient measures 11.0 mmHg. Aortic valve peak gradient measures 20.2 mmHg. Aortic valve area, by VTI measures 1.71 cm. Pulmonic Valve: The pulmonic valve was normal in structure. Pulmonic valve regurgitation is not visualized. No evidence of pulmonic stenosis. Aorta: Aortic dilatation noted. There is mild dilatation of the ascending aorta, measuring 40 mm. Venous: The inferior vena cava is normal in size with less than 50% respiratory variability, suggesting right atrial pressure of 8 mmHg. IAS/Shunts: No atrial level shunt detected by color flow Doppler.  LEFT VENTRICLE PLAX 2D LVIDd:         5.50 cm      Diastology LVIDs:         4.80 cm      LV e' medial:    4.24 cm/s LV PW:         1.10 cm      LV E/e' medial:  20.2 LV IVS:        1.20 cm      LV e' lateral:   12.00 cm/s LVOT diam:     2.10 cm      LV E/e' lateral: 7.1 LV SV:         69 LV SV Index:   36 LVOT Area:     3.46 cm  LV Volumes (MOD) LV vol d, MOD A2C: 187.0 ml LV vol d, MOD A4C: 185.0 ml LV vol s, MOD  A2C: 120.0 ml LV vol s, MOD A4C: 121.0 ml LV SV MOD A2C:     67.0 ml LV SV MOD A4C:     185.0 ml LV SV MOD BP:      69.3 ml RIGHT VENTRICLE             IVC RV S prime:     12.80 cm/s  IVC diam: 2.10 cm TAPSE (M-mode): 2.6 cm LEFT ATRIUM             Index        RIGHT ATRIUM           Index LA diam:        5.60 cm 2.96 cm/m   RA Area:     22.60 cm LA Vol (A2C):   88.6 ml 46.77 ml/m  RA Volume:   69.20 ml  36.53 ml/m LA Vol (A4C):   92.5 ml 48.83 ml/m LA Biplane Vol: 94.1 ml 49.68 ml/m  AORTIC VALVE AV Area (Vmax):    1.64 cm AV Area (Vmean):   1.62 cm AV Area (VTI):     1.71 cm AV Vmax:           225.00 cm/s AV Vmean:          159.000 cm/s AV VTI:            0.402 m AV Peak Grad:      20.2 mmHg AV Mean Grad:  11.0 mmHg LVOT Vmax:         106.50 cm/s LVOT Vmean:        74.400 cm/s LVOT VTI:          0.198 m LVOT/AV VTI ratio: 0.49  AORTA Ao Root diam: 3.50 cm Ao Asc diam:  4.00 cm MITRAL VALVE MV Area (PHT): 4.29 cm    SHUNTS MV Decel Time: 177 msec    Systemic VTI:  0.20 m MV E velocity: 85.70 cm/s  Systemic Diam: 2.10 cm MV A velocity: 94.50 cm/s MV E/A ratio:  0.91 Fransico Him MD Electronically signed by Fransico Him MD Signature Date/Time: 04/25/2021/5:11:19 PM    Final     Cardiac Studies   Echo 04/25/2021   1. Left ventricular ejection fraction, by estimation, is 25 to 30%. The  left ventricle has severely decreased function. The left ventricle  demonstrates global hypokinesis. There is mild left ventricular  hypertrophy of the basal-septal segment. Left  ventricular diastolic parameters are consistent with Grade II diastolic  dysfunction (pseudonormalization).   2. Right ventricular systolic function is normal. The right ventricular  size is normal. Tricuspid regurgitation signal is inadequate for assessing  PA pressure.   3. Left atrial size was severely dilated.   4. Right atrial size was mildly dilated.   5. The mitral valve is normal in structure. Mild mitral valve   regurgitation. No evidence of mitral stenosis.   6. The aortic valve is calcified. There is severe calcifcation of the  aortic valve. There is severe thickening of the aortic valve. Aortic valve  regurgitation is not visualized. Aortic valve sclerosis/calcification is  present, without any evidence of  aortic stenosis. Aortic valve area, by VTI measures 1.71 cm. Aortic valve  mean gradient measures 11.0 mmHg. Aortic valve Vmax measures 2.25 m/s.   7. Aortic dilatation noted. There is mild dilatation of the ascending  aorta, measuring 40 mm.   8. The inferior vena cava is normal in size with <50% respiratory  variability, suggesting right atrial pressure of 8 mmHg.    Left and Right Heart Cath 09/18/2020   Prox LAD to Mid LAD lesion is 85% stenosed.   Mid LAD lesion is 50% stenosed.   Dist LAD lesion is 20% stenosed.   Prox Cx to Mid Cx lesion is 65% stenosed.   Prox RCA lesion is 95% stenosed.   Mid RCA lesion is 90% stenosed.   LV end diastolic pressure is mildly elevated.   Hemodynamic findings consistent with mild pulmonary hypertension.   There is severe coronary calcification involving the LAD and right coronary artery with mild calcification of the left circumflex vessel.   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   Mild right heart pressure elevation with mild pulmonary hypertension.   RECOMMENDATION: The patient has severe coronary calcification and three-vessel disease.  Echo Doppler has demonstrated reduced EF in the 25 to 30% range.  Recommend initial surgical consultation for consideration of possible CABG  revascularization surgery.  However with the patient's significant comorbidities, if surgery is excluded, will need to review with colleagues regarding potential orbital atherectomy and intervention into the LAD and RCA.  We will consider reinstitution of low-dose  heparin much later today and this patient with recent PAF and ulcerated plaque in the proximal RCA. Diagnostic Dominance: Co-dominant   Patient Profile     81 y.o. 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 Ralene Bathe  Assessment & Plan    NSTEMI  - Patient with typical story for anginal chest pain along with ECG findings of inferior lateral ST depressions and elevated troponin all consistent with NSTEMI.  Treating medically as he is not candidate for additional interventions based off discussion with interventional cardiology, review of his prior cath, and comorbidities   - Completed 48 hours of IV heparin. Discontinuing heparin this AM - Restart eliquis. Patient is 81 years old, weighs 68.8 kg, and is in ESRD. Will continue eliquis 5mg  BID for now, will transition to a lower dose when patient is older than 80 years  - With the transition to eliquis, will discontinue ASA. Will plan to discharge on eliquis and plavix  - Continue daily metoprolol, imdur   - Increase dose of lipitor to 40 mg daily. Most recent LFTs from 09/15/2020 were WNL, will need LFTs and lipid panel in 8 weeks   Chronic combined systolic/diastolic HF  - Echo this admission showed EF 25-30%, grade II diastolic dysfunction  - Patient euvolemic on exam  - Continue aldactone  - Continue metoprolol. Increased dose to 37.5 mg BID yesterday, BP continues to be stable  - Historically, GDMT has been limited due to ESRD, soft BP    Paroxysmal Atrial  fibrillation  - Maintaining sinus rhythm per telemetry  - On eliquis, metoprolol  - Prescribed eliquis at home (reports not taking), will restart once heparin complete   ESRD  - On dialysis TTS - Nephrology to see while inpatient    Cirrhosis of liver  Known gastric varices  - Managed per primary, on aldactone and lactulose  For questions or updates, please contact [facility] Please consult [email or web address] for contact info under        Signed, Margie Billet, PA-C  04/27/2021, 8:29 AM    Patient seen and examined. Agree with assessment and plan.  No recurrent chest pain.  Maintaining sinus rhythm.  Plan for hemodialysis today with subsequent discharge after dialysis.  He has been transition to Eliquis since heparin has been discontinued.   Troy Sine, MD, [facility] 04/27/2021 9:59 AM

## 2021-04-27 NOTE — Progress Notes (Signed)
[facility] Progress Note   Subjective:    Seen and examined at bedside. No complaints. Denies CP, SOB, and N/V. Plan for HD today.  Objective Vitals:   04/26/21 1052 04/26/21 1112 04/26/21 2038 04/27/21 0451  BP: 121/73 121/73 114/70 107/60  Pulse: 85 89 83 82  Resp:  16 15 16   Temp:  98.6 F (37 C) 98.1 F (36.7 C) (!) 97.3 F (36.3 C)  TempSrc:  Oral Oral Axillary  SpO2:  98% 97% 96%  Weight:      Height:       Physical Exam General: Well-appearing; NAD Heart: S1 and S2; No murmurs, gallops, or rubs Lungs: Clear throughout; No wheezing, rales, or rhonchi Abdomen: Soft and non-tender Extremities: No edema BLLE Dialysis Access: L AVF (+) B/T   Filed Weights   04/25/21 1200  Weight: 68.8 kg    Intake/Output Summary (Last 24 hours) at 04/27/2021 0918 Last data filed at 04/27/2021 0541 Gross per 24 hour  Intake 894.79 ml  Output --  Net 894.79 ml    Additional Objective Labs: Basic Metabolic Panel: Recent Labs  Lab 04/25/21 0251 04/26/21 0050 04/27/21 0147  NA 138 137 136  K 3.8 4.1 4.3  CL 96* 100 99  CO2 30 23 20*  GLUCOSE 149* 124* 101*  BUN 39* 58* 84*  CREATININE 4.01* 4.95* 6.55*  CALCIUM 9.3 8.0* 7.8*  PHOS  --   --  5.7*   Liver Function Tests: No results for input(s): AST, ALT, ALKPHOS, BILITOT, PROT, ALBUMIN in the last 168 hours. No results for input(s): LIPASE, AMYLASE in the last 168 hours. CBC: Recent Labs  Lab 04/25/21 0251 04/26/21 0050 04/27/21 0147  WBC 12.3* 9.2 11.1*  HGB 12.6* 10.1* 10.1*  HCT 38.9* 31.7* 31.2*  MCV 99.7 100.6* 98.7  PLT 202 145* 144*   Blood Culture    Component Value Date/Time   SDES PERITONEAL FLUID 02/19/2020 1603   SDES PERITONEAL FLUID 02/19/2020 1603   SPECREQUEST ABDOMEN 02/19/2020 1603   SPECREQUEST ABDOMEN 02/19/2020 1603   CULT  02/19/2020 1603    NO GROWTH 5 DAYS Performed at [facility], [place] [address] [place], [place] [address]    REPTSTATUS 02/19/2020 FINAL  02/19/2020 1603   REPTSTATUS 02/27/2020 FINAL 02/19/2020 1603    Cardiac Enzymes: No results for input(s): CKTOTAL, CKMB, CKMBINDEX, TROPONINI in the last 168 hours. CBG: Recent Labs  Lab 04/26/21 0729 04/26/21 1111 04/26/21 1703 04/26/21 2041 04/27/21 0745  GLUCAP 102* 164* 141* 114* 133*   Iron Studies:  Recent Labs    04/26/21 1241  IRON 141  TIBC 186*  FERRITIN 674*   Lab Results  Component Value Date   INR 1.2 09/15/2020   INR 1.2 09/24/2019   INR 1.4 (H) 01/21/2019   Studies/Results: ECHOCARDIOGRAM COMPLETE  Result Date: 04/25/2021    ECHOCARDIOGRAM REPORT   Patient Name:   Ricky Harris Date of Exam: 04/25/2021 Medical Rec #:  [id number]      Height:       72.0 in Accession #:    [id number]     Weight:       151.7 lb Date of Birth:  06/26/1940     BSA:          1.894 m Patient Age:    81 years       BP:           115/59 mmHg Patient Gender: M  HR:           91 bpm. Exam Location:  Inpatient Procedure: 2D Echo Indications:    NSTEMI  History:        Patient has prior history of Echocardiogram examinations, most                 recent 09/16/2020. End stage renal disease. Cirrhosis,                 Arrythmias:Paroxysmal A-Fib; Risk Factors:Hypertension and                 Dyslipidemia.  Sonographer:    Johny Chess RDCS Referring Phys: [id number] JAN A [place]  1. Left ventricular ejection fraction, by estimation, is 25 to 30%. The left ventricle has severely decreased function. The left ventricle demonstrates global hypokinesis. There is mild left ventricular hypertrophy of the basal-septal segment. Left ventricular diastolic parameters are consistent with Grade II diastolic dysfunction (pseudonormalization).  2. Right ventricular systolic function is normal. The right ventricular size is normal. Tricuspid regurgitation signal is inadequate for assessing PA pressure.  3. Left atrial size was severely dilated.  4. Right atrial size was mildly dilated.  5.  The mitral valve is normal in structure. Mild mitral valve regurgitation. No evidence of mitral stenosis.  6. The aortic valve is calcified. There is severe calcifcation of the aortic valve. There is severe thickening of the aortic valve. Aortic valve regurgitation is not visualized. Aortic valve sclerosis/calcification is present, without any evidence of aortic stenosis. Aortic valve area, by VTI measures 1.71 cm. Aortic valve mean gradient measures 11.0 mmHg. Aortic valve Vmax measures 2.25 m/s.  7. Aortic dilatation noted. There is mild dilatation of the ascending aorta, measuring 40 mm.  8. The inferior vena cava is normal in size with <50% respiratory variability, suggesting right atrial pressure of 8 mmHg. FINDINGS  Left Ventricle: Left ventricular ejection fraction, by estimation, is 25 to 30%. The left ventricle has severely decreased function. The left ventricle demonstrates global hypokinesis. The left ventricular internal cavity size was normal in size. There is mild left ventricular hypertrophy of the basal-septal segment. Left ventricular diastolic parameters are consistent with Grade II diastolic dysfunction (pseudonormalization). Normal left ventricular filling pressure. Right Ventricle: The right ventricular size is normal. No increase in right ventricular wall thickness. Right ventricular systolic function is normal. Tricuspid regurgitation signal is inadequate for assessing PA pressure. Left Atrium: Left atrial size was severely dilated. Right Atrium: Right atrial size was mildly dilated. Pericardium: There is no evidence of pericardial effusion. Mitral Valve: The mitral valve is normal in structure. Mild mitral valve regurgitation. No evidence of mitral valve stenosis. Tricuspid Valve: The tricuspid valve is normal in structure. Tricuspid valve regurgitation is mild . No evidence of tricuspid stenosis. Aortic Valve: The aortic valve is calcified. There is severe calcifcation of the aortic valve.  There is severe thickening of the aortic valve. Aortic valve regurgitation is not visualized. Aortic valve sclerosis/calcification is present, without any evidence of aortic stenosis. Aortic valve mean gradient measures 11.0 mmHg. Aortic valve peak gradient measures 20.2 mmHg. Aortic valve area, by VTI measures 1.71 cm. Pulmonic Valve: The pulmonic valve was normal in structure. Pulmonic valve regurgitation is not visualized. No evidence of pulmonic stenosis. Aorta: Aortic dilatation noted. There is mild dilatation of the ascending aorta, measuring 40 mm. Venous: The inferior vena cava is normal in size with less than 50% respiratory variability, suggesting right atrial pressure of 8 mmHg. IAS/Shunts: No  atrial level shunt detected by color flow Doppler.  LEFT VENTRICLE PLAX 2D LVIDd:         5.50 cm      Diastology LVIDs:         4.80 cm      LV e' medial:    4.24 cm/s LV PW:         1.10 cm      LV E/e' medial:  20.2 LV IVS:        1.20 cm      LV e' lateral:   12.00 cm/s LVOT diam:     2.10 cm      LV E/e' lateral: 7.1 LV SV:         69 LV SV Index:   36 LVOT Area:     3.46 cm  LV Volumes (MOD) LV vol d, MOD A2C: 187.0 ml LV vol d, MOD A4C: 185.0 ml LV vol s, MOD A2C: 120.0 ml LV vol s, MOD A4C: 121.0 ml LV SV MOD A2C:     67.0 ml LV SV MOD A4C:     185.0 ml LV SV MOD BP:      69.3 ml RIGHT VENTRICLE             IVC RV S prime:     12.80 cm/s  IVC diam: 2.10 cm TAPSE (M-mode): 2.6 cm LEFT ATRIUM             Index        RIGHT ATRIUM           Index LA diam:        5.60 cm 2.96 cm/m   RA Area:     22.60 cm LA Vol (A2C):   88.6 ml 46.77 ml/m  RA Volume:   69.20 ml  36.53 ml/m LA Vol (A4C):   92.5 ml 48.83 ml/m LA Biplane Vol: 94.1 ml 49.68 ml/m  AORTIC VALVE AV Area (Vmax):    1.64 cm AV Area (Vmean):   1.62 cm AV Area (VTI):     1.71 cm AV Vmax:           225.00 cm/s AV Vmean:          159.000 cm/s AV VTI:            0.402 m AV Peak Grad:      20.2 mmHg AV Mean Grad:      11.0 mmHg LVOT Vmax:          106.50 cm/s LVOT Vmean:        74.400 cm/s LVOT VTI:          0.198 m LVOT/AV VTI ratio: 0.49  AORTA Ao Root diam: 3.50 cm Ao Asc diam:  4.00 cm MITRAL VALVE MV Area (PHT): 4.29 cm    SHUNTS MV Decel Time: 177 msec    Systemic VTI:  0.20 m MV E velocity: 85.70 cm/s  Systemic Diam: 2.10 cm MV A velocity: 94.50 cm/s MV E/A ratio:  0.91 Fransico Him MD Electronically signed by Fransico Him MD Signature Date/Time: 04/25/2021/5:11:19 PM    Final     Medications:  sodium chloride     sodium chloride      apixaban  5 mg Oral BID   aspirin EC  81 mg Oral Daily   atorvastatin  40 mg Oral Daily   calcium acetate  667 mg Oral TID WC   clopidogrel  75 mg Oral Daily   febuxostat  40 mg  Oral Daily   insulin aspart  0-9 Units Subcutaneous TID AC & HS   isosorbide mononitrate  15 mg Oral Daily   lactulose  10 g Oral Daily   metoprolol succinate  37.5 mg Oral Daily   multivitamin  1 tablet Oral Daily   spironolactone  12.5 mg Oral Daily    Dialysis Orders: TTS - [facility] 4hrs, BFR 350, DFR 500,  EDW 69kg, 3K/ 2.5Ca   Heparin 4000 bolus with HD; 2000 units mid-run Mircera 75 mcg q2wks - last 04/20/21 Home meds: Calcium Acetate 667mg  1 tab TID with meals  Assessment/Plan: NSTEMI: Cardiology following; treating medically at this time; currently not a candidate for surgical intervention; plan to transition to DAPT at discharge. Paroxysmal Afib: Currently in SR; Continue IV heparin and metoprolol  Chronic combined systolic/diastolic HF: Recent ECHO showed EF 25-30% and grade II diastolic dysfunction, continue Metoprolol and Aldactone ESRD - on HD TTS; plan for HD 2/28 per his usual schedule Hypertension/volume  - BP stable and euvolemic on exam; continue metoprolol and imdur Anemia of CKD - Hgb 10.1, ESA recently give on 04/20/21 Secondary Hyperparathyroidism - Ca slightly low, will use 3Ca bath here, monitor trends, may need to start VDRA. Continue binders Nutrition - Renal diet with fluid  restriction  Tobie Poet, NP [facility] 04/27/2021,9:18 AM  LOS: 2 days

## 2021-04-27 NOTE — Progress Notes (Signed)
Spoke with cardiology and was verbally advised to give po heart medications that were due this morning.

## 2021-04-28 ENCOUNTER — Other Ambulatory Visit (HOSPITAL_COMMUNITY): Payer: Self-pay

## 2021-04-28 ENCOUNTER — Telehealth (HOSPITAL_COMMUNITY): Payer: Self-pay

## 2021-04-28 NOTE — Telephone Encounter (Signed)
Pharmacy Transitions of Care Follow-up Telephone Call ? ?Date of discharge: 04/27/21  ?Discharge Diagnosis: NSTEMI ? ?How have you been since you were released from the hospital? Patient is doing well.  ? ?Medication changes made at discharge: ?START taking these medications ? ?START taking these medications  ?clopidogrel 75 MG tablet ?Commonly known as: PLAVIX ?Take 1 tablet (75 mg total) by mouth daily.  ? ?CHANGE how you take these medications ? ?CHANGE how you take these medications  ?atorvastatin 40 MG tablet ?Commonly known as: LIPITOR ?Take 1 tablet (40 mg total) by mouth daily. ?What changed:  ?medication strength ?how much to take ?when to take this ?Another medication with the same name was removed. Continue taking this medication, and follow the directions you see here.  ?metoprolol succinate 25 MG 24 hr tablet ?Commonly known as: TOPROL-XL ?Take 1 and 1/2 tablets (37.5 mg total) by mouth daily. ?What changed: how much to take  ? ?CONTINUE taking these medications ? ?CONTINUE taking these medications  ?calcium acetate 667 MG capsule ?Commonly known as: PHOSLO ?Take 1 capsule (667 mg total) by mouth 3 (three) times daily with meals.  ?cetirizine 10 MG tablet ?Commonly known as: ZYRTEC  ?Eliquis 5 MG Tabs tablet ?Generic drug: apixaban ?Take 1 tablet (5 mg total) by mouth 2 (two) times daily.  ?isosorbide mononitrate 30 MG 24 hr tablet ?Commonly known as: IMDUR ?Take 1/2 tablet (15 mg total) by mouth daily.  ?levothyroxine 50 MCG tablet ?Commonly known as: SYNTHROID ?Take 1 tablet (50 mcg total) by mouth daily.  ?multivitamin Tabs tablet ?Take 1 tablet by mouth daily.  ?spironolactone 25 MG tablet ?Commonly known as: ALDACTONE ?Take 1/2 tablet (12.5 mg total) by mouth daily.  ?traMADol 50 MG tablet ?Commonly known as: ULTRAM ?Take 1 tablet (50 mg total) by mouth every 8 (eight) hours as needed for moderate pain.  ? ?STOP taking these medications ? ?STOP taking these medications  ?aspirin EC 81 MG tablet   ?carvedilol 12.5 MG tablet ?Commonly known as: COREG  ?febuxostat 40 MG tablet ?Commonly known as: ULORIC  ?lactulose 10 GM/15ML solution ?Commonly known as: [place]  ? ? ?Medication changes verified by the patient? yes  ?  ? ?Medication Accessibility: ? ?Home Pharmacy: [facility]  ? ?Was the patient provided with refills on discharged medications? Yes  ? ?Have all prescriptions been transferred from [facility] to home pharmacy? Yes  ? ?Is the patient able to afford medications? yes ?Notable copays: 0 ?Eligible patient assistance: no ?  ? ?Medication Review: ?  ?APIXABAN (ELIQUIS) -PTA ?Apixaban 5 mg BID  ?- Discussed importance of taking medication around the same time everyday  ?- Reviewed potential DDIs with patient  ?- Advised patient of medications to avoid (NSAIDs, ASA)  ?- Educated that Tylenol (acetaminophen) will be the preferred analgesic to prevent risk of bleeding  ?- Emphasized importance of monitoring for signs and symptoms of bleeding (abnormal bruising, prolonged bleeding, nose bleeds, bleeding from gums, discolored urine, black tarry stools)  ?- Advised patient to alert all providers of anticoagulation therapy prior to starting a new medication or having a procedure  ? ?CLOPIDOGREL (PLAVIX)-new ?Clopidogrel 75 mg once daily.  ?- Educated patient on expected duration of therapy of clopidogrel. Advised patient to stop aspirin per MD.  ?- Reviewed potential DDIs with patient  ?- Advised patient of medications to avoid (NSAIDs, ASA)  ?- Educated that Tylenol (acetaminophen) will be the preferred analgesic to prevent risk of bleeding  ?- Emphasized importance of monitoring for signs  and symptoms of bleeding (abnormal bruising, prolonged bleeding, nose bleeds, bleeding from gums, discolored urine, black tarry stools)  ?- Advised patient to alert all providers of anticoagulation therapy prior to starting a new medication or having a procedure  ? ?Follow-up Appointments: ?Date Visit Type Length Department    ? 05/13/2021  8:25 AM OFFICE VISIT 25 min [facility] [[id number]]  ? ? ?If their condition worsens, is the pt aware to call PCP or go to the Emergency Dept.? Yes ? ?Final Patient Assessment: ?Patient is doing well. Wants to transfer medications to [facility] mail order. Address was verified, no credit card information collected due to $0 copays. ? ?Darcus Austin, PharmD ?Clinical Pharmacist ?[facility] ?04/28/2021 9:55 AM ? ?

## 2021-04-29 ENCOUNTER — Telehealth: Payer: Self-pay | Admitting: Nephrology

## 2021-04-29 NOTE — Telephone Encounter (Signed)
Transition of care contact from inpatient facility ? ?Date of discharge: 04/27/21 ?Date of contact: 04/29/21 ?Method: Phone ?Spoke to: Patient ? ?Patient contacted to discuss transition of care from recent inpatient hospitalization. Patient was admitted to [facility] from 2/26-2/28/23 with discharge diagnosis of NSTEMI.  ? ?Medication changes were reviewed. No concerns about medications  ? ?Patient will follow up with his/her outpatient HD unit on: on his way to dialysis today  ? ? ?

## 2021-05-05 ENCOUNTER — Other Ambulatory Visit (HOSPITAL_COMMUNITY): Payer: Self-pay

## 2021-05-10 NOTE — Progress Notes (Unsigned)
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[facility])    Coronary artery disease    on CT scan   Gout    H/O hypercholesterolemia    Heart murmur    History of falling    recent- see ct chest results of 04/25/2018   Hypertension    Hypothyroidism    Liver cirrhosis ([place])    Prediabetes    pt denies    Renal artery stenosis ([place])    Thrombocytopenia ([place])    Wears glasses    Wears glasses    Past Surgical History:  Procedure Laterality Date   ABDOMINAL AORTIC ENDOVASCULAR STENT GRAFT N/A 01/21/2019   Procedure: ABDOMINAL AORTIC ENDOVASCULAR STENT GRAFT WITH CO2;  Surgeon: Angelia Mould, MD;  Location: [place];  Service: Vascular;  Laterality: N/A;   ABDOMINAL AORTOGRAM W/LOWER EXTREMITY Bilateral 12/14/2018   Procedure: ABDOMINAL AORTOGRAM W/LOWER EXTREMITY;  Surgeon: Angelia Mould, MD;  Location: [place] CV [facility];  Service: Cardiovascular;  Laterality: Bilateral;   AV FISTULA PLACEMENT Left 03/12/2019   Procedure: LEFT ARTERIOVENOUS Arteriovenous FISTULA CREATION.;  Surgeon: Angelia Mould, MD;  Location: [facility] OR;  Service: Vascular;  Laterality: Left;   CATARACT EXTRACTION W/ INTRAOCULAR LENS  IMPLANT, BILATERAL     CHOLECYSTECTOMY     ESOPHAGEAL BANDING N/A 08/11/2017   Procedure: ESOPHAGEAL BANDING;  Surgeon: Otis Brace, MD;  Location: [place];  Service: Gastroenterology;  Laterality: N/A;   ESOPHAGEAL BANDING N/A 10/26/2017    Procedure: ESOPHAGEAL BANDING;  Surgeon: Otis Brace, MD;  Location: [facility];  Service: Gastroenterology;  Laterality: N/A;   ESOPHAGEAL BANDING N/A 12/19/2017   Procedure: ESOPHAGEAL BANDING;  Surgeon: Otis Brace, MD;  Location: [facility];  Service: Gastroenterology;  Laterality: N/A;   ESOPHAGOGASTRODUODENOSCOPY (EGD) WITH PROPOFOL N/A 07/05/2017   Procedure: ESOPHAGOGASTRODUODENOSCOPY (EGD) WITH PROPOFOL;  Surgeon: Otis Brace, MD;  Location: [place];  Service: Gastroenterology;  Laterality: N/A;   ESOPHAGOGASTRODUODENOSCOPY (EGD) WITH PROPOFOL N/A 08/11/2017   Procedure: ESOPHAGOGASTRODUODENOSCOPY (EGD) WITH PROPOFOL;  Surgeon: Otis Brace, MD;  Location: [place];  Service: Gastroenterology;  Laterality: N/A;   ESOPHAGOGASTRODUODENOSCOPY (EGD) WITH PROPOFOL N/A 10/26/2017   Procedure: ESOPHAGOGASTRODUODENOSCOPY (EGD) WITH PROPOFOL;  Surgeon: Otis Brace, MD;  [facility];  Service: Gastroenterology;  Laterality: N/A;   ESOPHAGOGASTRODUODENOSCOPY (EGD) WITH PROPOFOL N/A 12/19/2017   Procedure: ESOPHAGOGASTRODUODENOSCOPY (EGD) WITH PROPOFOL;  Surgeon: Otis Brace, MD;  Location: [facility];  Service: Gastroenterology;  Laterality: N/A;   ESOPHAGOGASTRODUODENOSCOPY (EGD) WITH PROPOFOL N/A 10/29/2018   Procedure: ESOPHAGOGASTRODUODENOSCOPY (EGD) WITH PROPOFOL;  Surgeon: Otis Brace, MD;  [facility]: [facility];  Service: Gastroenterology;  Laterality: N/A;   ESOPHAGOGASTRODUODENOSCOPY (EGD) WITH PROPOFOL N/A 02/14/2020   Procedure: ESOPHAGOGASTRODUODENOSCOPY (EGD) WITH PROPOFOL;  Surgeon: Otis Brace, MD;  Location: [facility];  Service: Gastroenterology;  Laterality: N/A;  EYE SURGERY     bilateral cataract removal with lens placement   HERNIA REPAIR     IR PARACENTESIS  12/26/2018   IR PARACENTESIS  01/02/2019   IR PARACENTESIS  01/15/2019   IR PARACENTESIS  01/30/2019   IR PARACENTESIS  02/05/2019   IR PARACENTESIS   02/14/2019   IR PARACENTESIS  02/28/2019   IR PARACENTESIS  03/14/2019   IR PARACENTESIS  03/29/2019   IR PARACENTESIS  04/11/2019   IR PARACENTESIS  04/25/2019   IR PARACENTESIS  05/09/2019   IR PARACENTESIS  06/03/2019   IR PARACENTESIS  06/06/2019   IR PARACENTESIS  06/20/2019   IR PARACENTESIS  07/03/2019   IR PARACENTESIS  07/19/2019   IR PARACENTESIS  08/08/2019   IR PARACENTESIS  08/27/2019   IR PARACENTESIS  09/09/2019   IR PARACENTESIS  09/24/2019   IR PARACENTESIS  10/07/2019   IR PARACENTESIS  10/23/2019   IR PARACENTESIS  11/07/2019   IR PARACENTESIS  11/21/2019   IR PARACENTESIS  12/04/2019   IR PARACENTESIS  12/25/2019   IR PARACENTESIS  01/10/2020   IR PARACENTESIS  01/27/2020   IR PARACENTESIS  02/19/2020   IR PARACENTESIS  03/27/2020   PROSTATECTOMY     RIGHT AND LEFT HEART CATH N/A 09/18/2020   Procedure: RIGHT AND LEFT HEART CATH;  Surgeon: Troy Sine, MD;  Location: [place] [facility];  Service: Cardiovascular;  Laterality: N/A;   subtotal gastrectomy      Allergies  Allergies  Allergen Reactions   Cephalexin Hives   Hydralazine Hives    History of Present Illness    ***  Home Medications    Prior to Admission medications   Medication Sig Start Date End Date Taking? Authorizing Provider  apixaban (ELIQUIS) 5 MG TABS tablet Take 1 tablet (5 mg total) by mouth 2 (two) times daily. 04/27/21 06/26/21  Dessa Phi, DO  atorvastatin (LIPITOR) 40 MG tablet Take 1 tablet (40 mg total) by mouth daily. 04/28/21   Dessa Phi, DO  calcium acetate (PHOSLO) 667 MG capsule Take 1 capsule (667 mg total) by mouth 3 (three) times daily with meals. 02/20/20   Rai, Vernelle Emerald, MD  cetirizine (ZYRTEC) 10 MG tablet Take 10 mg by mouth daily as needed for allergies.    [provider]  clopidogrel (PLAVIX) 75 MG tablet Take 1 tablet (75 mg total) by mouth daily. 04/28/21   Dessa Phi, DO  isosorbide mononitrate (IMDUR) 30 MG 24 hr tablet Take 1/2 tablet (15 mg total) by  mouth daily. 04/27/21   Dessa Phi, DO  levothyroxine (SYNTHROID) 50 MCG tablet Take 1 tablet (50 mcg total) by mouth daily. 04/27/21   Dessa Phi, DO  metoprolol succinate (TOPROL-XL) 25 MG 24 hr tablet Take 1 and 1/2 tablets (37.5 mg total) by mouth daily. 04/27/21 06/26/21  Dessa Phi, DO  multivitamin (RENA-VIT) TABS tablet Take 1 tablet by mouth daily. Patient not taking: Reported on 04/25/2021 09/27/19   Autry-Lott, Naaman Plummer, DO  spironolactone (ALDACTONE) 25 MG tablet Take 1/2 tablet (12.5 mg total) by mouth daily. 04/27/21   Dessa Phi, DO  traMADol (ULTRAM) 50 MG tablet Take 1 tablet (50 mg total) by mouth every 8 (eight) hours as needed for moderate pain. 03/12/19   Gabriel Earing, PA-C    Family History    Family History  Problem Relation Age of Onset   Diabetes Mellitus II Mother    Stroke Mother    He indicated that his mother is deceased.  He indicated that his father is deceased.  Social History    Social History   Socioeconomic History   Marital status: Divorced    Spouse name: Not on file   Number of children: Not on file   Years of education: Not on file   Highest education level: Not on file  Occupational History   Not on file  Tobacco Use   Smoking status: Never   Smokeless tobacco: Never  Vaping Use   Vaping Use: Never used  Substance and Sexual Activity   Alcohol use: Not Currently   Drug use: Never   Sexual activity: Not on file  Other Topics Concern   Not on file  Social History Narrative   Not on file   Social Determinants of Health   Financial Resource Strain: Not on file  Food Insecurity: Not on file  Transportation Needs: Not on file  Physical Activity: Not on file  Stress: Not on file  Social Connections: Not on file  Intimate Partner Violence: Not on file     Review of Systems    General:  No chills, fever, night sweats or weight changes.  Cardiovascular:  No chest pain, dyspnea on exertion, edema, orthopnea,  palpitations, paroxysmal nocturnal dyspnea. Dermatological: No rash, lesions/masses Respiratory: No cough, dyspnea Urologic: No hematuria, dysuria Abdominal:   No nausea, vomiting, diarrhea, bright red blood per rectum, melena, or hematemesis Neurologic:  No visual changes, wkns, changes in mental status. All other systems reviewed and are otherwise negative except as noted above.  Physical Exam    VS:  There were no vitals taken for this visit. , BMI There is no height or weight on file to calculate BMI. GEN: Well nourished, well developed, in no acute distress. HEENT: normal. Neck: Supple, no JVD, carotid bruits, or masses. Cardiac: RRR, no murmurs, rubs, or gallops. No clubbing, cyanosis, edema.  Radials/DP/PT 2+ and equal bilaterally.  Respiratory:  Respirations regular and unlabored, clear to auscultation bilaterally. GI: Soft, nontender, nondistended, BS + x 4. MS: no deformity or atrophy. Skin: warm and dry, no rash. Neuro:  Strength and sensation are intact. Psych: Normal affect.  Accessory Clinical Findings    Recent Labs: 09/15/2020: ALT 14; B Natriuretic Peptide >4,500.0 09/18/2020: TSH 4.650 09/21/2020: Magnesium 1.9 04/27/2021: BUN 84; Creatinine, Ser 6.55; Hemoglobin 10.1; Platelets 144; Potassium 4.3; Sodium 136   Recent Lipid Panel    Component Value Date/Time   CHOL 133 09/19/2020 0358   TRIG 103 09/19/2020 0358   HDL 38 (L) 09/19/2020 0358   CHOLHDL 3.5 09/19/2020 0358   VLDL 21 09/19/2020 0358   LDLCALC 74 09/19/2020 0358    ECG personally reviewed by me today- *** - No acute changes  Assessment & Plan   1.  ***   Jossie Ng. Dannel Rafter NP-C    05/10/2021, 7:00 AM [place] [address] Office [phone] Fax [phone]  Notice: This dictation was prepared with Dragon dictation along with smaller phrase technology. Any transcriptional errors that result from this process are unintentional and may not be  corrected upon review.  I spent***minutes examining this patient, reviewing medications, and using patient centered shared decision making involving her cardiac care.  Prior to her visit I spent greater than 20 minutes reviewing her past medical history,  medications, and prior cardiac tests.

## 2021-05-13 ENCOUNTER — Ambulatory Visit: Payer: Medicare Other | Admitting: General Practice

## 2021-05-13 ENCOUNTER — Other Ambulatory Visit: Payer: Self-pay

## 2021-05-13 ENCOUNTER — Encounter: Payer: Self-pay | Admitting: General Practice

## 2021-05-13 VITALS — BP 122/64 | HR 72 | Ht 72.0 in | Wt 161.2 lb

## 2021-05-13 DIAGNOSIS — E782 Mixed hyperlipidemia: Secondary | ICD-10-CM

## 2021-05-13 DIAGNOSIS — I5042 Chronic combined systolic (congestive) and diastolic (congestive) heart failure: Secondary | ICD-10-CM | POA: Diagnosis not present

## 2021-05-13 DIAGNOSIS — I480 Paroxysmal atrial fibrillation: Secondary | ICD-10-CM | POA: Diagnosis not present

## 2021-05-13 DIAGNOSIS — N186 End stage renal disease: Secondary | ICD-10-CM

## 2021-05-13 DIAGNOSIS — K7469 Other cirrhosis of liver: Secondary | ICD-10-CM

## 2021-05-13 DIAGNOSIS — I214 Non-ST elevation (NSTEMI) myocardial infarction: Secondary | ICD-10-CM | POA: Diagnosis not present

## 2021-05-13 NOTE — Patient Instructions (Signed)
Medication Instructions:  ?The current medical regimen is effective;  continue present plan and medications as directed. Please refer to the Current Medication list given to you today. ? ?*If you need a refill on your cardiac medications before your next appointment, please call your pharmacy* ? ?Lab Work: ?FASTING LIPID AND LFT IN 4 WEEKS ?If you have labs (blood work) drawn today and your tests are completely normal, you will receive your results only by:  [place] (if you have MyChart) OR A paper copy in the mail.  If you have any lab test that is abnormal or we need to change your treatment, we will call you to review the results. You may go to any [facility] that is convenient for you however, we do have a lab in our office that is able to assist you. You DO NOT need an appointment for our lab. The lab is open 8:00am and closes at 4:00pm. Lunch 12:45 - 1:45pm. ? ?Special Instructions ? ?PLEASE READ AND FOLLOW SALTY 6-ATTACHED-1,800 mg daily ? ?PLEASE INCREASE PHYSICAL ACTIVITY AS TOLERATED ? ?Follow-Up: ?Your next appointment:  4 month(s) In Person with Peter Martinique, MD    ? ?At [facility], you and your health needs are our priority.  As part of our continuing mission to provide you with exceptional heart care, we have created designated Provider Care Teams.  These Care Teams include your primary Cardiologist (physician) and Advanced Practice Providers (APPs -  Physician Assistants and Nurse Practitioners) who all work together to provide you with the care you need, when you need it. ? ? ? ? ?  ? ? ? ? ?

## 2021-05-17 ENCOUNTER — Other Ambulatory Visit (HOSPITAL_COMMUNITY): Payer: Self-pay

## 2021-05-24 ENCOUNTER — Other Ambulatory Visit (HOSPITAL_COMMUNITY): Payer: Self-pay

## 2021-05-31 ENCOUNTER — Other Ambulatory Visit: Payer: Medicare Other | Admitting: Hospice

## 2021-05-31 DIAGNOSIS — Z992 Dependence on renal dialysis: Secondary | ICD-10-CM

## 2021-05-31 DIAGNOSIS — I10 Essential (primary) hypertension: Secondary | ICD-10-CM

## 2021-05-31 DIAGNOSIS — Z515 Encounter for palliative care: Secondary | ICD-10-CM

## 2021-05-31 DIAGNOSIS — I214 Non-ST elevation (NSTEMI) myocardial infarction: Secondary | ICD-10-CM

## 2021-05-31 NOTE — Progress Notes (Signed)
? ? ?Manufacturing engineer ?Community Palliative Care Consult Note ?Telephone: [phone]  ?Fax: [phone] ? ?PATIENT NAME: Ricky Harris ?[address] ?[place] [id number] ?[phone] (home)  ?DOB: Mar 18, 1940 ?MRN: [id number] ? ?PRIMARY CARE PROVIDER:    ?[name]., MD,  ?[place] ?[place] [address] ?[phone] ? ?REFERRING PROVIDER:   ?[name]., MD ?[address] ?[place],  [place] [address] ?[phone] ? ?RESPONSIBLE PARTY:   Self [phone] ?Emergency contact: Ricky Harris [phone]  ? ?Contact Information   ? ? Name Relation Home Work Mobile  ? Ricky Harris Son   [phone]  ? gongora Harris,Ricky Son [phone]    ? Harris,Ricky Daughter   [phone]  ? ?  ?TELEHEALTH VISIT STATEMENT ?Due to the COVID-19 crisis, this visit was done via telemedicine from my office and it was initiated and consent by this patient and or family.  ?I connected with patient OR PROXY by a telephone/video  and verified that I am speaking with the correct person. I discussed the limitations of evaluation and management by telemedicine. Patient/proxy expressed understanding and agreed to proceed. ?Palliative Care was asked to follow this patient to address advance care planning, complex medical decision making and goals of care clarification.   ? ?CODE STATUS: Patient is a Do Not Resuscitate; Signed DNR in Epic ? ?Goals of Care: Goals include to maximize quality of life and symptom management. Patient/family interested in hospice service in the future.  ? ?Symptom Management/Plan: ?NSTEMI: Hospitalized 2/26 - 04/27/2021 for medical management with IV heparin for 48 hours before transitioning to Eliquis and Plavix.  Continue Eliquis and metoprolol and Imdur as currently ordered.  Patient denies chest pain and is compliant with treatment.  Follow-up with cardiologist as planned.  ?Paroxysmal atrial fibrillation: Continue Eliquis, metoprolol as ordered. ?Hypertension: Managed with Aldactone,  metoprolol. ?ESRD: Continue dialysis Tue Thur Sats. Well tolerated, compliant.  ?Follow up: Palliative care will continue to follow for complex medical decision making, advance care planning, and clarification of goals. Return 6 weeks or prn.Encouraged to call provider sooner with any concerns.  ? ?Family /Caregiver/Community Supports: Patient lives at home with his son and son's faincee. Patient worked as Psychologist, counselling in an airport and travelled around the [place].  ? ?HOSPICE ELIGIBILITY/DIAGNOSIS: TBD ? ? ?Chief Complaint: Follow up visit ? ?HISTORY OF PRESENT ILLNESS:  Ricky Harris is a 81 y.o. year old male  with multiple morbidities requiring close monitoring/management with high risk of complications and morbidity: Recent STEMI, end-stage renal disease on hemodialysis, hypertension, atrial fibrillation, hypothyroidism, AAA with a stent graft, Gout, Liver Cirrhosis.  Patient denies chest pain, palpitations, dizziness.  He is ambulatory within his home, in no acute distress. ?History obtained from review of EMR, discussion with primary team, caregiver, family and/or [name].  Independent interpretation of tests, and review of available labs as needed. ?Review and summarization of Epic records shows history from other than patient. Rest of 10 point ROS asked and negative. ? ? ? ?PAST MEDICAL HISTORY:  ?Active Ambulatory Problems  ?  Diagnosis Date Noted  ? Ascites 05/20/2017  ? Normochromic normocytic anemia 05/20/2017  ? Essential hypertension 05/20/2017  ? CKD (chronic kidney disease) stage 3, GFR 30-59 ml/min (HCC) 05/20/2017  ? HLD (hyperlipidemia) 05/20/2017  ? Hypothyroidism 05/20/2017  ? Abdominal aortic aneurysm (AAA) ([place]) 01/21/2019  ? AAA (abdominal aortic aneurysm) without rupture ([place]) 01/21/2019  ? Hyperkalemia 09/23/2019  ? CKD (chronic kidney disease), stage V ([place])  09/23/2019  ? Acute kidney insufficiency 09/23/2019  ? End-stage renal disease on hemodialysis ([place]) 02/18/2020  ? Dyspnea  02/18/2020  ? Allergy, unspecified, initial encounter 09/27/2019  ? Aortic valve sclerosis 02/23/2017  ? Atherosclerosis of abdominal aorta ([place]) 12/02/2016  ? Benign neoplasm of colon 04/01/2015  ? Bilateral chronic knee pain 03/11/2016  ? Left hip pain 06/13/2016  ? Cirrhosis of liver ([place]) 05/29/2017  ? Coronary artery disease involving native coronary artery of native heart without angina pectoris 02/23/2017  ? Degeneration of lumbar intervertebral disc 04/01/2015  ? Dependence on renal dialysis ([place]) 07/13/2020  ? Diarrhea, unspecified 09/26/2019  ? Encounter for immunization 11/19/2019  ? Erectile dysfunction 04/01/2015  ? 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 Gout 04/01/2015  ? H/O peptic ulcer 05/13/2015  ? History of prostate cancer 05/13/2015  ? Hypercalcemia 04/25/2020  ? Impaired cognition 02/26/2020  ? Iron deficiency anemia, unspecified 09/27/2019  ? Lumbar radiculopathy 05/22/2020  ? Osteoarthritis 04/01/2015  ? Fluid overload, unspecified 04/14/2020  ? Pain, unspecified 09/26/2019  ? Prediabetes 04/01/2015  ? Pruritus 04/15/2019  ? Pure hypercholesterolemia 07/13/2020  ? Renal artery stenosis ([place]) 04/01/2015  ? 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 Senile nuclear sclerosis 04/05/2016  ? Senile purpura ([place]) 04/15/2019  ? Thrombocytopenia ([place]) 11/21/2015  ? Trochanteric bursitis of left hip 12/27/2018  ? Unspecified protein-calorie malnutrition ([place]) 09/27/2019  ? Vitamin B12 deficiency 05/13/2015  ? Bilateral pleural effusion 09/15/2020  ? Thoracic aortic aneurysm ([place]) 09/15/2020  ? Elevated troponin 09/15/2020  ? Chest pain 09/15/2020  ? CAP (community acquired pneumonia) 09/17/2020  ? A-fib ([place]) 09/18/2020  ? NSTEMI (non-ST elevated myocardial infarction) ([place]) 04/25/2021  ? GERD without esophagitis 04/25/2021  ? Paroxysmal atrial fibrillation ([place]) 04/25/2021  ? Malnutrition of moderate degree 04/27/2021  ? ?Resolved Ambulatory Problems  ?   Diagnosis Date Noted  ? No Resolved Ambulatory Problems  ? ?Past Medical History:  ?Diagnosis Date  ? AAA (abdominal aortic aneurysm)   ? Anemia   ? Aortic stenosis   ? Arthritis   ? Cancer [facility])   ? Chronic kidney disease   ? 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([place])   ? Coronary artery disease   ? H/O hypercholesterolemia   ? Heart murmur   ? History of falling   ? Hypertension   ? Liver cirrhosis ([place])   ? Wears glasses   ? Wears glasses   ? ? ?SOCIAL HX:  ?Social History  ? ?Tobacco Use  ? Smoking status: Never  ? Smokeless tobacco: Never  ?Substance Use Topics  ? Alcohol use: Not Currently  ? ?  ?FAMILY HX:  ?Family History  ?Problem Relation Age of Onset  ? Diabetes Mellitus II Mother   ? Stroke Mother   ?   ? ?ALLERGIES:  ?Allergies  ?Allergen Reactions  ? Cephalexin Hives  ? Hydralazine Hives  ?   ? ?PERTINENT MEDICATIONS:  ?Outpatient Encounter Medications as of 05/31/2021  ?Medication Sig  ? apixaban (ELIQUIS) 5 MG TABS tablet Take 1 tablet (5 mg total) by mouth 2 (two) times daily.  ? atorvastatin (LIPITOR) 40 MG tablet Take 1 tablet (40 mg total) by mouth daily.  ? calcium acetate (PHOSLO) 667 MG capsule Take 1 capsule (667 mg total) by mouth 3 (three) times daily with meals.  ? cetirizine (ZYRTEC) 10 MG tablet Take 10 mg by mouth daily as needed for allergies.  ? clopidogrel (PLAVIX) 75 MG tablet Take 1 tablet (75  mg total) by mouth daily.  ? isosorbide mononitrate (IMDUR) 30 MG 24 hr tablet Take 1/2 tablet (15 mg total) by mouth daily.  ? levothyroxine (SYNTHROID) 50 MCG tablet Take 1 tablet (50 mcg total) by mouth daily.  ? metoprolol succinate (TOPROL-XL) 25 MG 24 hr tablet Take 1 and 1/2 tablets (37.5 mg total) by mouth daily.  ? multivitamin (RENA-VIT) TABS tablet Take 1 tablet by mouth daily. (Patient not taking: Reported on 04/25/2021)  ? spironolactone (ALDACTONE) 25 MG tablet Take 1/2 tablet (12.5 mg total) by mouth daily.  ? traMADol (ULTRAM) 50 MG tablet Take 1 tablet (50 mg total) by mouth  every 8 (eight) hours as needed for moderate pain.  ? ?No facility-administered encounter medications on file as of 05/31/2021.  ? ? ?Thank you for the opportunity to participate in the care of Mr. Dickerman.  The pal

## 2021-06-24 ENCOUNTER — Telehealth: Payer: Self-pay | Admitting: Cardiology

## 2021-06-24 NOTE — Telephone Encounter (Signed)
? ?*  STAT* If patient is at the pharmacy, call can be transferred to refill team. ? ? ?1. Which medications need to be refilled? (please list name of each medication and dose if known) apixaban (ELIQUIS) 5 MG TABS tablet ? ?clopidogrel (PLAVIX) 75 MG tablet ? ?2. Which pharmacy/location (including street and city if local pharmacy) is medication to be sent to? [facility] [place], [place] ? ?3. Do they need a 30 day or 90 day supply? 30 days ? ?Pt is out of meds  ?

## 2021-06-25 MED ORDER — APIXABAN 5 MG PO TABS
5.0000 mg | ORAL_TABLET | Freq: Two times a day (BID) | ORAL | 1 refills | Status: DC
Start: 2021-06-25 — End: 2021-08-17

## 2021-06-25 NOTE — Telephone Encounter (Signed)
Prescription refill request for Eliquis received. ?Indication:Afib ?Last office visit:3/23 ?Scr:5.55 ?Age: 81 ?Weight:73.1 kg ? ?Prescription refilled ? ?

## 2021-07-13 ENCOUNTER — Emergency Department (HOSPITAL_COMMUNITY): Payer: Medicare Other

## 2021-07-13 ENCOUNTER — Emergency Department (HOSPITAL_COMMUNITY)
Admission: EM | Admit: 2021-07-13 | Discharge: 2021-07-13 | Disposition: A | Discharge status: Home / Self Care | Payer: Medicare Other | Attending: Emergency Medicine | Admitting: Emergency Medicine

## 2021-07-13 ENCOUNTER — Other Ambulatory Visit: Payer: Self-pay

## 2021-07-13 ENCOUNTER — Encounter (HOSPITAL_COMMUNITY): Payer: Self-pay

## 2021-07-13 DIAGNOSIS — I129 Hypertensive chronic kidney disease with stage 1 through stage 4 chronic kidney disease, or unspecified chronic kidney disease: Secondary | ICD-10-CM | POA: Insufficient documentation

## 2021-07-13 DIAGNOSIS — R55 Syncope and collapse: Secondary | ICD-10-CM | POA: Insufficient documentation

## 2021-07-13 DIAGNOSIS — R410 Disorientation, unspecified: Secondary | ICD-10-CM | POA: Insufficient documentation

## 2021-07-13 DIAGNOSIS — Z79899 Other long term (current) drug therapy: Secondary | ICD-10-CM | POA: Diagnosis not present

## 2021-07-13 DIAGNOSIS — M25521 Pain in right elbow: Secondary | ICD-10-CM | POA: Diagnosis not present

## 2021-07-13 DIAGNOSIS — Z7902 Long term (current) use of antithrombotics/antiplatelets: Secondary | ICD-10-CM | POA: Insufficient documentation

## 2021-07-13 DIAGNOSIS — N189 Chronic kidney disease, unspecified: Secondary | ICD-10-CM | POA: Diagnosis not present

## 2021-07-13 DIAGNOSIS — Z7901 Long term (current) use of anticoagulants: Secondary | ICD-10-CM | POA: Insufficient documentation

## 2021-07-13 DIAGNOSIS — I2510 Atherosclerotic heart disease of native coronary artery without angina pectoris: Secondary | ICD-10-CM | POA: Diagnosis not present

## 2021-07-13 DIAGNOSIS — I4891 Unspecified atrial fibrillation: Secondary | ICD-10-CM | POA: Diagnosis not present

## 2021-07-13 DIAGNOSIS — Z992 Dependence on renal dialysis: Secondary | ICD-10-CM | POA: Insufficient documentation

## 2021-07-13 LAB — CBC
HCT: 31.6 % — ABNORMAL LOW (ref 39.0–52.0)
Hemoglobin: 10.8 g/dL — ABNORMAL LOW (ref 13.0–17.0)
MCH: 33.8 pg (ref 26.0–34.0)
MCHC: 34.2 g/dL (ref 30.0–36.0)
MCV: 98.8 fL (ref 80.0–100.0)
Platelets: 122 10*3/uL — ABNORMAL LOW (ref 150–400)
RBC: 3.2 MIL/uL — ABNORMAL LOW (ref 4.22–5.81)
RDW: 15.8 % — ABNORMAL HIGH (ref 11.5–15.5)
WBC: 7 10*3/uL (ref 4.0–10.5)
nRBC: 0 % (ref 0.0–0.2)

## 2021-07-13 LAB — BASIC METABOLIC PANEL
Anion gap: 10 (ref 5–15)
BUN: 46 mg/dL — ABNORMAL HIGH (ref 8–23)
CO2: 26 mmol/L (ref 22–32)
Calcium: 8.8 mg/dL — ABNORMAL LOW (ref 8.9–10.3)
Chloride: 101 mmol/L (ref 98–111)
Creatinine, Ser: 3.9 mg/dL — ABNORMAL HIGH (ref 0.61–1.24)
GFR, Estimated: 15 mL/min — ABNORMAL LOW (ref >60)
Glucose, Bld: 109 mg/dL — ABNORMAL HIGH (ref 70–99)
Potassium: 3.3 mmol/L — ABNORMAL LOW (ref 3.5–5.1)
Sodium: 137 mmol/L (ref 135–145)

## 2021-07-13 LAB — CBG MONITORING, ED: Glucose-Capillary: 97 mg/dL (ref 70–99)

## 2021-07-13 MED ORDER — SODIUM CHLORIDE 0.9% FLUSH
3.0000 mL | Freq: Once | INTRAVENOUS | Status: AC
  Administered 2021-07-13: 3 mL via INTRAVENOUS

## 2021-07-13 MED ORDER — SODIUM CHLORIDE 0.9 % IV BOLUS
500.0000 mL | Freq: Once | INTRAVENOUS | Status: AC
  Administered 2021-07-13: 500 mL via INTRAVENOUS

## 2021-07-13 NOTE — ED Notes (Signed)
Patient ambulated with his walker without difficulty ?

## 2021-07-13 NOTE — Discharge Instructions (Addendum)
Your recent near passing out episode is likely due to overdiuresis from recent dialysis session. Please follow up with your doctor for further care.  Return if you have any concerns.  ?

## 2021-07-13 NOTE — ED Provider Notes (Signed)
?[facility] ?Provider Note ? ? ?CSN: [id number] ?Arrival date & time: 07/13/21  1723 ? ?  ? ?History ? ?Chief Complaint  ?Patient presents with  ? Near Syncope  ? ? ?Ricky Harris is a 81 y.o. male. ? ?The history is provided by the patient, the EMS personnel and medical records. No language interpreter was used.  ?Near Syncope ? ? ?81 year old male significant history of hypertension, Karlene Lineman with liver cirrhosis, gout, renal artery stenosis, CAD, history of recurrent falls, AAA, thrombocytopenia, atrial fibrillation currently on Plavix and Eliquis, CKD, brought here via EMS for evaluation of a fall.  Patient is a Tuesday Thursday Saturday dialysis patient.  He had a full course of dialysis today.  Upon getting out of the car at the [employer] parking lot, patient felt lightheadedness and fell down to the ground striking his right elbow against the ground but did not hit his head or any loss of consciousness.  EMS was called and per EMS note, initial blood pressure was 89/51.  EMS report the patient was slightly confused upon their arrival.  Patient endorsed mild tenderness to his right elbow but otherwise he denies having any headache, neck pain, chest pain, trouble breathing, abdominal pain, back pain or pain to his other extremities.  Denies nausea confusion and does not have any other complaint. ? ?Home Medications ?Prior to Admission medications   ?Medication Sig Start Date End Date Taking? Authorizing Provider  ?apixaban (ELIQUIS) 5 MG TABS tablet Take 1 tablet (5 mg total) by mouth 2 (two) times daily. 06/25/21 08/24/21  Martinique, Peter M, MD  ?atorvastatin (LIPITOR) 40 MG tablet Take 1 tablet (40 mg total) by mouth daily. 04/28/21   Dessa Phi, DO  ?calcium acetate (PHOSLO) 667 MG capsule Take 1 capsule (667 mg total) by mouth 3 (three) times daily with meals. 02/20/20   Rai, Vernelle Emerald, MD  ?cetirizine (ZYRTEC) 10 MG tablet Take 10 mg by mouth daily as needed for allergies.     [provider]  ?clopidogrel (PLAVIX) 75 MG tablet Take 1 tablet (75 mg total) by mouth daily. 04/28/21   Dessa Phi, DO  ?isosorbide mononitrate (IMDUR) 30 MG 24 hr tablet Take 1/2 tablet (15 mg total) by mouth daily. 04/27/21   Dessa Phi, DO  ?levothyroxine (SYNTHROID) 50 MCG tablet Take 1 tablet (50 mcg total) by mouth daily. 04/27/21   Dessa Phi, DO  ?metoprolol succinate (TOPROL-XL) 25 MG 24 hr tablet Take 1 and 1/2 tablets (37.5 mg total) by mouth daily. 04/27/21 06/26/21  Dessa Phi, DO  ?multivitamin (RENA-VIT) TABS tablet Take 1 tablet by mouth daily. ?Patient not taking: Reported on 04/25/2021 09/27/19   Autry-Lott, Naaman Plummer, DO  ?spironolactone (ALDACTONE) 25 MG tablet Take 1/2 tablet (12.5 mg total) by mouth daily. 04/27/21   Dessa Phi, DO  ?traMADol (ULTRAM) 50 MG tablet Take 1 tablet (50 mg total) by mouth every 8 (eight) hours as needed for moderate pain. 03/12/19   Gabriel Earing, PA-C  ?   ? ?Allergies    ?Cephalexin and Hydralazine   ? ?Review of Systems   ?Review of Systems  ?Cardiovascular:  Positive for near-syncope.  ?All other systems reviewed and are negative. ? ?Physical Exam ?Updated Vital Signs ?BP (!) 118/56 (BP Location: Right Arm)   Pulse 64   Temp 98.4 ?F (36.9 ?C) (Oral)   Resp 16   Ht 6' (1.829 m)   Wt 74 kg   SpO2 100%   BMI 22.13  kg/m?  ?Physical Exam ?Vitals and nursing note reviewed.  ?Constitutional:   ?   General: He is not in acute distress. ?   Appearance: He is well-developed.  ?HENT:  ?   Head: Normocephalic and atraumatic.  ?Eyes:  ?   Extraocular Movements: Extraocular movements intact.  ?   Conjunctiva/sclera: Conjunctivae normal.  ?   Pupils: Pupils are equal, round, and reactive to light.  ?Cardiovascular:  ?   Rate and Rhythm: Normal rate and regular rhythm.  ?   Pulses: Normal pulses.  ?   Heart sounds: Normal heart sounds.  ?Pulmonary:  ?   Effort: Pulmonary effort is normal.  ?   Breath sounds: Normal breath sounds.  ?Abdominal:  ?    Palpations: Abdomen is soft.  ?Musculoskeletal:     ?   General: Signs of injury (Right elbow: Small skin tear noted to the posterior elbow with normal flexion extension no deformity noted.  No significant tenderness to palpation.) present.  ?   Cervical back: Normal range of motion and neck supple. No tenderness.  ?   Comments: Able to move all 4 extremities with equal effort  ?Skin: ?   Findings: No rash.  ?Neurological:  ?   Mental Status: He is alert. He is disoriented.  ?   GCS: GCS eye subscore is 4. GCS verbal subscore is 5. GCS motor subscore is 6.  ?   Cranial Nerves: Cranial nerves 2-12 are intact.  ?   Sensory: Sensation is intact.  ?   Motor: Motor function is intact. No weakness.  ? ? ?ED Results / Procedures / Treatments   ?Labs ?(all labs ordered are listed, but only abnormal results are displayed) ?Labs Reviewed  ?BASIC METABOLIC PANEL - Abnormal; Notable for the following components:  ?    Result Value  ? Potassium 3.3 (*)   ? Glucose, Bld 109 (*)   ? BUN 46 (*)   ? Creatinine, Ser 3.90 (*)   ? Calcium 8.8 (*)   ? GFR, Estimated 15 (*)   ? All other components within normal limits  ?CBC - Abnormal; Notable for the following components:  ? RBC 3.20 (*)   ? Hemoglobin 10.8 (*)   ? HCT 31.6 (*)   ? RDW 15.8 (*)   ? Platelets 122 (*)   ? All other components within normal limits  ?URINALYSIS, ROUTINE W REFLEX MICROSCOPIC  ?CBG MONITORING, ED  ? ? ?EKG ?EKG Interpretation ? ?Date/Time:  Tuesday Jul 13 2021 17:27:35 EDT ?Ventricular Rate:  65 ?PR Interval:  168 ?QRS Duration: 108 ?QT Interval:  403 ?QTC Calculation: 419 ?R Axis:   -41 ?Text Interpretation: Sinus rhythm Left ventricular hypertrophy Anterior infarct, old Baseline wander in lead(s) I III aVL Interpretation limited secondary to artifact similar to prior Confirmed by Wynona Dove ([id number]) on 07/13/2021 5:30:50 PM ? ?Radiology ?CT Head Wo Contrast ? ?Result Date: 07/13/2021 ?CLINICAL DATA:  Recent fall with headaches and neck pain, initial  encounter EXAM: CT HEAD WITHOUT CONTRAST CT CERVICAL SPINE WITHOUT CONTRAST TECHNIQUE: Multidetector CT imaging of the head and cervical spine was performed following the standard protocol without intravenous contrast. Multiplanar CT image reconstructions of the cervical spine were also generated. RADIATION DOSE REDUCTION: This exam was performed according to the departmental dose-optimization program which includes automated exposure control, adjustment of the mA and/or kV according to patient size and/or use of iterative reconstruction technique. COMPARISON:  None Available. FINDINGS: CT HEAD FINDINGS Brain: No evidence of acute infarction,  hemorrhage, hydrocephalus, extra-axial collection or mass lesion/mass effect. Mild chronic white matter ischemic changes are noted. Vascular: No hyperdense vessel or unexpected calcification. Skull: Normal. Negative for fracture or focal lesion. Sinuses/Orbits: No acute finding. Other: None. CT CERVICAL SPINE FINDINGS Alignment: Within normal limits. Skull base and vertebrae: 7 cervical segments are well visualized. Vertebral body height is well maintained. Facet hypertrophic changes are noted primarily on the right. No acute fracture or acute facet abnormality is seen. Soft tissues and spinal canal: Surrounding soft tissue structures are within normal limits. Diffuse vascular calcifications are seen. Upper chest: Negative. Other: None IMPRESSION: CT of the head: Chronic white matter ischemic changes without acute abnormality. CT of cervical spine: Multilevel degenerative changes without acute abnormality. Electronically Signed   By: Inez Catalina M.D.   On: 07/13/2021 19:23  ? ?CT Cervical Spine Wo Contrast ? ?Result Date: 07/13/2021 ?CLINICAL DATA:  Recent fall with headaches and neck pain, initial encounter EXAM: CT HEAD WITHOUT CONTRAST CT CERVICAL SPINE WITHOUT CONTRAST TECHNIQUE: Multidetector CT imaging of the head and cervical spine was performed following the standard  protocol without intravenous contrast. Multiplanar CT image reconstructions of the cervical spine were also generated. RADIATION DOSE REDUCTION: This exam was performed according to the departmental dose-optimizati

## 2021-07-13 NOTE — ED Triage Notes (Signed)
Per EMS patient was at a [employer] parking lot and "fell" upon getting out of the car. Per EMS, son caught the patient/ patient did not hit his head. EMS reports an initial pressure of 89/51/ patient is on blood thinners x2. EMS reports that the patient was slightly confused on their arrival. Patient alert & oriented on arrival.  ?

## 2021-07-13 NOTE — ED Notes (Signed)
Provider at bedside at this time to evaluate the patient ?

## 2021-07-13 NOTE — ED Notes (Signed)
Patient reports to provider when at bedside that he doesn't make urine ?

## 2021-08-02 ENCOUNTER — Other Ambulatory Visit: Payer: Medicare Other | Admitting: Hospice

## 2021-08-16 ENCOUNTER — Other Ambulatory Visit: Payer: Self-pay | Admitting: Cardiology

## 2021-08-17 NOTE — Telephone Encounter (Signed)
Prescription refill request for Eliquis received. Indication:Afib Last office visit:3/23 Scr:5.8 Age: 81 Weight:74 kg  Prescription refilled

## 2021-09-07 ENCOUNTER — Other Ambulatory Visit: Payer: Self-pay

## 2021-09-07 ENCOUNTER — Encounter (HOSPITAL_COMMUNITY): Payer: Self-pay

## 2021-09-07 ENCOUNTER — Emergency Department (HOSPITAL_COMMUNITY)
Admission: EM | Admit: 2021-09-07 | Discharge: 2021-09-08 | Disposition: A | Discharge status: Home / Self Care | Payer: Medicare Other | Attending: Emergency Medicine | Admitting: Emergency Medicine

## 2021-09-07 DIAGNOSIS — R42 Dizziness and giddiness: Secondary | ICD-10-CM | POA: Diagnosis present

## 2021-09-07 DIAGNOSIS — E039 Hypothyroidism, unspecified: Secondary | ICD-10-CM | POA: Insufficient documentation

## 2021-09-07 DIAGNOSIS — Z7901 Long term (current) use of anticoagulants: Secondary | ICD-10-CM | POA: Diagnosis not present

## 2021-09-07 DIAGNOSIS — Z7902 Long term (current) use of antithrombotics/antiplatelets: Secondary | ICD-10-CM | POA: Diagnosis not present

## 2021-09-07 DIAGNOSIS — I509 Heart failure, unspecified: Secondary | ICD-10-CM | POA: Insufficient documentation

## 2021-09-07 DIAGNOSIS — R531 Weakness: Secondary | ICD-10-CM | POA: Diagnosis not present

## 2021-09-07 DIAGNOSIS — N189 Chronic kidney disease, unspecified: Secondary | ICD-10-CM | POA: Insufficient documentation

## 2021-09-07 DIAGNOSIS — I130 Hypertensive heart and chronic kidney disease with heart failure and stage 1 through stage 4 chronic kidney disease, or unspecified chronic kidney disease: Secondary | ICD-10-CM | POA: Insufficient documentation

## 2021-09-07 LAB — CBG MONITORING, ED: Glucose-Capillary: 98 mg/dL (ref 70–99)

## 2021-09-07 LAB — BASIC METABOLIC PANEL
Anion gap: 14 (ref 5–15)
BUN: 41 mg/dL — ABNORMAL HIGH (ref 8–23)
CO2: 25 mmol/L (ref 22–32)
Calcium: 8.5 mg/dL — ABNORMAL LOW (ref 8.9–10.3)
Chloride: 99 mmol/L (ref 98–111)
Creatinine, Ser: 5.42 mg/dL — ABNORMAL HIGH (ref 0.61–1.24)
GFR, Estimated: 10 mL/min — ABNORMAL LOW (ref >60)
Glucose, Bld: 92 mg/dL (ref 70–99)
Potassium: 4 mmol/L (ref 3.5–5.1)
Sodium: 138 mmol/L (ref 135–145)

## 2021-09-07 LAB — CBC
HCT: 34.7 % — ABNORMAL LOW (ref 39.0–52.0)
Hemoglobin: 11.3 g/dL — ABNORMAL LOW (ref 13.0–17.0)
MCH: 34.2 pg — ABNORMAL HIGH (ref 26.0–34.0)
MCHC: 32.6 g/dL (ref 30.0–36.0)
MCV: 105.2 fL — ABNORMAL HIGH (ref 80.0–100.0)
Platelets: 134 10*3/uL — ABNORMAL LOW (ref 150–400)
RBC: 3.3 MIL/uL — ABNORMAL LOW (ref 4.22–5.81)
RDW: 15.3 % (ref 11.5–15.5)
WBC: 10.3 10*3/uL (ref 4.0–10.5)
nRBC: 0 % (ref 0.0–0.2)

## 2021-09-07 LAB — TROPONIN I (HIGH SENSITIVITY): Troponin I (High Sensitivity): 29 ng/L — ABNORMAL HIGH (ref <18)

## 2021-09-07 MED ORDER — SODIUM CHLORIDE 0.9 % IV BOLUS
500.0000 mL | Freq: Once | INTRAVENOUS | Status: AC
  Administered 2021-09-07: 500 mL via INTRAVENOUS

## 2021-09-07 NOTE — Progress Notes (Signed)
Cardiology Office Note   Date:  09/10/2021   ID:  Ricky Harris, DOB May 26, 1940, MRN [id number]  PCP:  [name]., MD  Cardiologist:   Tywaun Hiltner Martinique, MD   Chief Complaint  Patient presents with   Coronary Artery Disease      History of Present Illness: Ricky Harris is a 81 y.o. male who presents for follow up CAD. 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  He presented to the emergency department on 04/25/2021 with acute onset of central chest discomfort.  He reported his pain was an 8 out of 10.  He also describes a tightness in his chest.  He noted radiation to his left arm.  His EKG showed sinus tachycardia with incomplete right bundle branch block.  He was also felt to have 2 mm anterior lateral ST segment elevation with inferior lateral T wave inversion.  A repeat EKG showed sinus rhythm with a rate of 94 and PACs.  He was started on IV heparin.  Cardiology was consulted.  Medical management was recommended.  His IV heparin was continued for 48 hours and he was transitioned to Eliquis and Plavix.  Echocardiogram 04/25/2021 showed an EF of 25-30% with left ventricular global hypokinesis.  He was noted to have G2 DD, severely dilated left atria and mildly dilated right atrium.  Mild mitral valve regurgitation was noted. He was treated with Imdur and metoprolol and higher statin dose.    He previously underwent cardiac catheterization 09/18/2020.  During that time he was noted to have proximal LAD-mid LAD 85% stenosis, LAD 50% stenosis, distal LAD 20% stenosis, proximal circumflex-mid circumflex 65%, proximal RCA 95%, mid RCA 90% and his LV EDP was mildly elevated.  He was noted to have pulmonary hypertension.  He was referred for consideration for CABG.  Due to his significant comorbidities he was not felt to be a good candidate for CABG or PCI.  He is  seen today with his son. Seems to be holding his own. He has experienced a couple of episodes of profound hypotension after dialysis. This has happened a couple of times in the past 4 months. Last Tuesday went to the ED. Given fluid. Troponin and Ecg not acute. He denies any chest pain or increased SOB. Weight has been stable. No edema or increased abdominal girth. No bleeding.        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[facility])    Coronary artery disease    on CT scan   Gout    H/O hypercholesterolemia    Heart murmur    History of falling    recent- see ct chest results of 04/25/2018   Hypertension    Hypothyroidism    Liver cirrhosis ([place])    Prediabetes    pt denies    Renal artery stenosis ([facility])    Thrombocytopenia ([facility])    Wears glasses    Wears glasses  Past Surgical History:  Procedure Laterality Date   ABDOMINAL AORTIC ENDOVASCULAR STENT GRAFT N/A 01/21/2019   Procedure: ABDOMINAL AORTIC ENDOVASCULAR STENT GRAFT WITH CO2;  Surgeon: Angelia Mould, MD;  Location: [place];  Service: Vascular;  Laterality: N/A;   ABDOMINAL AORTOGRAM W/LOWER EXTREMITY Bilateral 12/14/2018   Procedure: ABDOMINAL AORTOGRAM W/LOWER EXTREMITY;  Surgeon: Angelia Mould, MD;  Location: [place] [facility];  Service: Cardiovascular;  Laterality: Bilateral;   AV FISTULA PLACEMENT Left 03/12/2019   Procedure: LEFT ARTERIOVENOUS Arteriovenous FISTULA CREATION.;  Surgeon: Angelia Mould, MD;  Location: [facility] OR;  Service: Vascular;  Laterality: Left;   CATARACT EXTRACTION W/ INTRAOCULAR LENS  IMPLANT, BILATERAL     CHOLECYSTECTOMY     ESOPHAGEAL BANDING N/A 08/11/2017    Procedure: ESOPHAGEAL BANDING;  Surgeon: Otis Brace, MD;  Location: [place];  Service: Gastroenterology;  Laterality: N/A;   ESOPHAGEAL BANDING N/A 10/26/2017   Procedure: ESOPHAGEAL BANDING;  Surgeon: Otis Brace, MD;  Location: [facility];  Service: Gastroenterology;  Laterality: N/A;   ESOPHAGEAL BANDING N/A 12/19/2017   Procedure: ESOPHAGEAL BANDING;  Surgeon: Otis Brace, MD;  Location: [facility];  Service: Gastroenterology;  Laterality: N/A;   ESOPHAGOGASTRODUODENOSCOPY (EGD) WITH PROPOFOL N/A 07/05/2017   Procedure: ESOPHAGOGASTRODUODENOSCOPY (EGD) WITH PROPOFOL;  Surgeon: Otis Brace, MD;  Location: [place];  Service: Gastroenterology;  Laterality: N/A;   ESOPHAGOGASTRODUODENOSCOPY (EGD) WITH PROPOFOL N/A 08/11/2017   Procedure: ESOPHAGOGASTRODUODENOSCOPY (EGD) WITH PROPOFOL;  Surgeon: Otis Brace, MD;  Location: [place];  Service: Gastroenterology;  Laterality: N/A;   ESOPHAGOGASTRODUODENOSCOPY (EGD) WITH PROPOFOL N/A 10/26/2017   Procedure: ESOPHAGOGASTRODUODENOSCOPY (EGD) WITH PROPOFOL;  Surgeon: Otis Brace, MD;  Location: [facility];  Service: Gastroenterology;  Laterality: N/A;   ESOPHAGOGASTRODUODENOSCOPY (EGD) WITH PROPOFOL N/A 12/19/2017   Procedure: ESOPHAGOGASTRODUODENOSCOPY (EGD) WITH PROPOFOL;  Surgeon: Otis Brace, MD;  Location: [facility];  Service: Gastroenterology;  Laterality: N/A;   ESOPHAGOGASTRODUODENOSCOPY (EGD) WITH PROPOFOL N/A 10/29/2018   Procedure: ESOPHAGOGASTRODUODENOSCOPY (EGD) WITH PROPOFOL;  Surgeon: Otis Brace, MD;  Location: [facility];  Service: Gastroenterology;  Laterality: N/A;   ESOPHAGOGASTRODUODENOSCOPY (EGD) WITH PROPOFOL N/A 02/14/2020   Procedure: ESOPHAGOGASTRODUODENOSCOPY (EGD) WITH PROPOFOL;  Surgeon: Otis Brace, MD;  Location: [facility];  Service: Gastroenterology;  Laterality: N/A;   EYE SURGERY     bilateral cataract removal with lens placement   HERNIA REPAIR      IR PARACENTESIS  12/26/2018   IR PARACENTESIS  01/02/2019   IR PARACENTESIS  01/15/2019   IR PARACENTESIS  01/30/2019   IR PARACENTESIS  02/05/2019   IR PARACENTESIS  02/14/2019   IR PARACENTESIS  02/28/2019   IR PARACENTESIS  03/14/2019   IR PARACENTESIS  03/29/2019   IR PARACENTESIS  04/11/2019   IR PARACENTESIS  04/25/2019   IR PARACENTESIS  05/09/2019   IR PARACENTESIS  06/03/2019   IR PARACENTESIS  06/06/2019   IR PARACENTESIS  06/20/2019   IR PARACENTESIS  07/03/2019   IR PARACENTESIS  07/19/2019   IR PARACENTESIS  08/08/2019   IR PARACENTESIS  08/27/2019   IR PARACENTESIS  09/09/2019   IR PARACENTESIS  09/24/2019   IR PARACENTESIS  10/07/2019   IR PARACENTESIS  10/23/2019   IR PARACENTESIS  11/07/2019   IR PARACENTESIS  11/21/2019   IR PARACENTESIS  12/04/2019   IR PARACENTESIS  12/25/2019   IR PARACENTESIS  01/10/2020   IR PARACENTESIS  01/27/2020   IR PARACENTESIS  02/19/2020   IR PARACENTESIS  03/27/2020   PROSTATECTOMY     RIGHT AND LEFT HEART  CATH N/A 09/18/2020   Procedure: RIGHT AND LEFT HEART CATH;  Surgeon: Troy Sine, MD;  Location: [place] [facility];  Service: Cardiovascular;  Laterality: N/A;   subtotal gastrectomy       Current Outpatient Medications  Medication Sig Dispense Refill   apixaban (ELIQUIS) 5 MG TABS tablet TAKE 1 TABLET(5 MG) BY MOUTH TWICE DAILY 60 tablet 5   atorvastatin (LIPITOR) 40 MG tablet Take 1 tablet (40 mg total) by mouth daily. 30 tablet 1   calcium acetate (PHOSLO) 667 MG capsule Take 1 capsule (667 mg total) by mouth 3 (three) times daily with meals. 90 capsule 2   cetirizine (ZYRTEC) 10 MG tablet Take 10 mg by mouth daily as needed for allergies.     clopidogrel (PLAVIX) 75 MG tablet Take 1 tablet (75 mg total) by mouth daily. 30 tablet 1   isosorbide mononitrate (IMDUR) 30 MG 24 hr tablet Take 1/2 tablet (15 mg total) by mouth daily. 30 tablet 1   levothyroxine (SYNTHROID) 50 MCG tablet Take 1 tablet (50 mcg total) by mouth daily. 30 tablet 1    multivitamin (RENA-VIT) TABS tablet Take 1 tablet by mouth daily. 30 tablet 0   spironolactone (ALDACTONE) 25 MG tablet Take 1/2 tablet (12.5 mg total) by mouth daily. 30 tablet 0   traMADol (ULTRAM) 50 MG tablet Take 1 tablet (50 mg total) by mouth every 8 (eight) hours as needed for moderate pain. 8 tablet 0   metoprolol succinate (TOPROL-XL) 25 MG 24 hr tablet Take 1 and 1/2 tablets (37.5 mg total) by mouth daily. 45 tablet 1   No current facility-administered medications for this visit.    Allergies:   Cephalexin and Hydralazine    Social History:  The patient  reports that he has never smoked. He has never used smokeless tobacco. He reports that he does not currently use alcohol. He reports that he does not use drugs.   Family History:  The patient's family history includes Diabetes Mellitus II in his mother; Stroke in his mother.    ROS:  Please see the history of present illness.   Otherwise, review of systems are positive for none.   All other systems are reviewed and negative.    PHYSICAL EXAM: VS:  BP (!) 102/56 (BP Location: Right Arm, Patient Position: Sitting, Cuff Size: Normal)   Pulse 93   Resp 20   Ht 6' (1.829 m)   Wt 169 lb 3.2 oz (76.7 kg)   SpO2 90%   BMI 22.95 kg/m  , BMI Body mass index is 22.95 kg/m. GEN: elderly, chronically ill appearing, in no acute distress HEENT: normal Neck: no JVD, carotid bruits, or masses Cardiac: RRR; soft SEM RUSB. no rubs, or gallops,no edema  Respiratory:  clear to auscultation bilaterally, normal work of breathing GI: soft, nontender, nondistended, + BS MS: no deformity or atrophy Skin: warm and dry, no rash Neuro:  Strength and sensation are intact Psych: euthymic mood, full affect   EKG:  EKG is not ordered today. The ekg ordered 09/07/21 demonstrates NSR, LAFB, LVH with repolarization abnormality. I have personally reviewed and interpreted this study.    Recent Labs: 09/15/2020: ALT 14; B Natriuretic Peptide  >4,500.0 09/18/2020: TSH 4.650 09/21/2020: Magnesium 1.9 09/07/2021: BUN 41; Creatinine, Ser 5.42; Hemoglobin 11.3; Platelets 134; Potassium 4.0; Sodium 138    Lipid Panel    Component Value Date/Time   CHOL 133 09/19/2020 0358   TRIG 103 09/19/2020 0358   HDL 38 (L)  09/19/2020 0358   CHOLHDL 3.5 09/19/2020 0358   VLDL 21 09/19/2020 0358   LDLCALC 74 09/19/2020 0358      Wt Readings from Last 3 Encounters:  09/10/21 169 lb 3.2 oz (76.7 kg)  07/13/21 163 lb 2.3 oz (74 kg)  05/13/21 161 lb 3.2 oz (73.1 kg)      Other studies Reviewed: Additional studies/ records that were reviewed today include:   Echocardiogram 04/25/2021 IMPRESSIONS     1. Left ventricular ejection fraction, by estimation, is 25 to 30%. The  left ventricle has severely decreased function. The left ventricle  demonstrates global hypokinesis. There is mild left ventricular  hypertrophy of the basal-septal segment. Left  ventricular diastolic parameters are consistent with Grade II diastolic  dysfunction (pseudonormalization).   2. Right ventricular systolic function is normal. The right ventricular  size is normal. Tricuspid regurgitation signal is inadequate for assessing  PA pressure.   3. Left atrial size was severely dilated.   4. Right atrial size was mildly dilated.   5. The mitral valve is normal in structure. Mild mitral valve  regurgitation. No evidence of mitral stenosis.   6. The aortic valve is calcified. There is severe calcifcation of the  aortic valve. There is severe thickening of the aortic valve. Aortic valve  regurgitation is not visualized. Aortic valve sclerosis/calcification is  present, without any evidence of  aortic stenosis. Aortic valve area, by VTI measures 1.71 cm. Aortic valve  mean gradient measures 11.0 mmHg. Aortic valve Vmax measures 2.25 m/s.   7. Aortic dilatation noted. There is mild dilatation of the ascending  aorta, measuring 40 mm.   8. The inferior vena cava is  normal in size with <50% respiratory  variability, suggesting right atrial pressure of 8 mmHg.   Cardiac catheterization 09/18/2020   Prox LAD to Mid LAD lesion is 85% stenosed.   Mid LAD lesion is 50% stenosed.   Dist LAD lesion is 20% stenosed.   Prox Cx to Mid Cx lesion is 65% stenosed.   Prox RCA lesion is 95% stenosed.   Mid RCA lesion is 90% stenosed.   LV end diastolic pressure is mildly elevated.   Hemodynamic findings consistent with mild pulmonary hypertension.   There is severe coronary calcification involving the LAD and right coronary artery with mild calcification of the left circumflex vessel.   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   Mild right heart pressure elevation with mild pulmonary hypertension.   RECOMMENDATION: The patient has severe coronary calcification and three-vessel disease.  Echo Doppler has demonstrated reduced EF in the 25 to 30% range.  Recommend initial surgical consultation for consideration of possible CABG revascularization surgery.  However with the patient's significant comorbidities, if surgery is excluded, will need to review with colleagues regarding potential orbital atherectomy and intervention into the LAD and RCA.  We will consider reinstitution of low-dose heparin much later today and this patient with recent PAF and ulcerated plaque in the proximal RCA.   Diagnostic Dominance: Co-dominant Intervention   ASSESSMENT AND PLAN:  1.  CAD - known severe 3 vessel disease by cath in July 2022. Poor candidate for CABG or PCI. S/p NSTEMI in February  2023 managed medically. Fortunately he is  not having any significant angina.  Continue Plavix for one year from Feb. Continue Toprol and Imdur.    2. Hyperlipidemia- Continue atorvastatin   3. Chronic systolic and diastolic CHF-no increased DOE or activity intolerance.  Weight stable, euvolemic.  Echocardiogram 04/25/2021 showed an LVEF of 25-30% with G2 DD.  Noted with GDMT due to ESRD and hypotension. Continue Aldactone, metoprolol Fluid volume managed by HD   4. Paroxysmal atrial fibrillation-no recurrent symptoms Continue apixaban, metoprolol   5. Liver cirrhosis-euvolemic.  Has known gastric varices.  Reports compliance with medical therapy. Continue lactulose, Aldactone Follows with PCP   6. End-stage renal disease-compliant with HD. Follows with nephrology. He is experiencing some hypotension post dialysis. Recommend he hold aldactone and Imdur on his dialysis days. If BP low hold Toprol as well.      Current medicines are reviewed at length with the patient today.  The patient does not have concerns regarding medicines.  The following changes have been made:  see above.  Labs/ tests ordered today include:  No orders of the defined types were placed in this encounter.        Disposition:   FU with me or APP in 4 months  Signed, Leib Elahi Martinique, MD  09/10/2021 8:01 AM    [facility] [address], [place], [place], [address] Phone [phone], Fax [phone]

## 2021-09-07 NOTE — ED Notes (Signed)
Attempted to obtain blood for labs, no success times 2 sticks. Attempted to call phlebotomy no answer. Will get NT or RN to attempt.

## 2021-09-07 NOTE — ED Provider Notes (Signed)
6:57 PM Patient's care assumed by myself at end of Lorin's PA shift.  81 yo male, received dialysis today, walking upstairs when had profound dizziness. No blunt head trauma. BP 60/. Pt received IVF prior to arrival with improvement in BP. Pt denies cp or sob.  Plan: Labs and dispo   Physical Exam  BP (!) 89/44   Pulse (!) 57   Temp 98 F (36.7 C) (Oral)   Resp 17   SpO2 100%   Physical Exam Vitals and nursing note reviewed.  Constitutional:      General: He is not in acute distress.    Appearance: He is well-developed.  HENT:     Head: Normocephalic and atraumatic.  Eyes:     Conjunctiva/sclera: Conjunctivae normal.  Cardiovascular:     Rate and Rhythm: Normal rate and regular rhythm.     Heart sounds: No murmur heard. Pulmonary:     Effort: Pulmonary effort is normal. No respiratory distress.     Breath sounds: Normal breath sounds.  Abdominal:     Palpations: Abdomen is soft.     Tenderness: There is no abdominal tenderness.  Musculoskeletal:        General: No swelling.     Cervical back: Neck supple.  Skin:    General: Skin is warm and dry.     Capillary Refill: Capillary refill takes less than 2 seconds.  Neurological:     Mental Status: He is alert.  Psychiatric:        Mood and Affect: Mood normal.     Procedures  Procedures  ED Course / MDM    Medical Decision Making Amount and/or Complexity of Data Reviewed Labs: ordered.   Patient stable on exam. BP continues to be above 100/. ECG, electrolytes, cxr, and troponin within normal limits. Pt is asymptomatic at this time. Likely too much volume taken off at dialysis.   Pt signed out to oncoming provider while awaiting delta trop. Plan for discharge as long as cardiac markers and BP remain stable.        Campbell Stall P, DO 67/01/41 1540

## 2021-09-07 NOTE — ED Notes (Signed)
Report given to [facility] [place]. All questions answered. Patient moved to [facility].

## 2021-09-07 NOTE — ED Provider Notes (Signed)
[facility] EMERGENCY DEPARTMENT Provider Note   CSN: [id number] Arrival date & time: 09/07/21  1722     History  Chief Complaint  Patient presents with   Dizziness   Weakness    Ricky Harris is a 81 y.o. male with history of hypertension, hypothyroidism, liver cirrhosis, chronic kidney disease, stable AAA, CAD, CHF with EF 25-30%, afib, heart murmur who presents the emergency department after syncopal episode.  Patient was coming home from dialysis, when he started feeling dizzy and weak.  His son was standing behind him and notes that he started to fall to the ground.  Son was able to catch him before he hit the ground.  They are not sure if the patient lost consciousness or not.  Patient did not have any chest pain, headache, nausea or vomiting.  Son had checked his blood pressure and noted the systolic numbers to be in the 60s.  They called EMS, and blood pressure at that time was 76 palpated.  EMS started a fluid bolus, and palpated a systolic blood pressure of 82 afterwards.  Patient complains of some persistent dizziness, no other symptoms at this time.   Dizziness Associated symptoms: weakness   Associated symptoms: no chest pain, no headaches and no shortness of breath   Weakness Associated symptoms: dizziness   Associated symptoms: no chest pain, no headaches, no seizures and no shortness of breath        Home Medications Prior to Admission medications   Medication Sig Start Date End Date Taking? Authorizing Provider  apixaban (ELIQUIS) 5 MG TABS tablet TAKE 1 TABLET(5 MG) BY MOUTH TWICE DAILY 08/17/21   Martinique, Peter M, MD  atorvastatin (LIPITOR) 40 MG tablet Take 1 tablet (40 mg total) by mouth daily. 04/28/21   Dessa Phi, DO  calcium acetate (PHOSLO) 667 MG capsule Take 1 capsule (667 mg total) by mouth 3 (three) times daily with meals. 02/20/20   Rai, Vernelle Emerald, MD  cetirizine (ZYRTEC) 10 MG tablet Take 10 mg by mouth daily as needed for  allergies.    [provider]  clopidogrel (PLAVIX) 75 MG tablet Take 1 tablet (75 mg total) by mouth daily. 04/28/21   Dessa Phi, DO  isosorbide mononitrate (IMDUR) 30 MG 24 hr tablet Take 1/2 tablet (15 mg total) by mouth daily. 04/27/21   Dessa Phi, DO  levothyroxine (SYNTHROID) 50 MCG tablet Take 1 tablet (50 mcg total) by mouth daily. 04/27/21   Dessa Phi, DO  metoprolol succinate (TOPROL-XL) 25 MG 24 hr tablet Take 1 and 1/2 tablets (37.5 mg total) by mouth daily. 04/27/21 06/26/21  Dessa Phi, DO  multivitamin (RENA-VIT) TABS tablet Take 1 tablet by mouth daily. Patient not taking: Reported on 04/25/2021 09/27/19   Autry-Lott, Naaman Plummer, DO  spironolactone (ALDACTONE) 25 MG tablet Take 1/2 tablet (12.5 mg total) by mouth daily. 04/27/21   Dessa Phi, DO  traMADol (ULTRAM) 50 MG tablet Take 1 tablet (50 mg total) by mouth every 8 (eight) hours as needed for moderate pain. 03/12/19   Rhyne, Hulen Shouts, PA-C      Allergies    Cephalexin and Hydralazine    Review of Systems   Review of Systems  Respiratory:  Negative for shortness of breath.   Cardiovascular:  Negative for chest pain.  Neurological:  Positive for dizziness, syncope and weakness. Negative for seizures, facial asymmetry, speech difficulty, numbness and headaches.  All other systems reviewed and are negative.   Physical Exam Updated Vital Signs  BP (!) 89/44   Pulse (!) 57   Temp 98 F (36.7 C) (Oral)   Resp 17   SpO2 100%  Physical Exam Vitals and nursing note reviewed.  Constitutional:      Appearance: Normal appearance.     Comments: Appears tired, but awake and cooperative  HENT:     Head: Normocephalic and atraumatic.  Eyes:     Conjunctiva/sclera: Conjunctivae normal.  Cardiovascular:     Rate and Rhythm: Normal rate and regular rhythm.     Pulses:          Radial pulses are 2+ on the right side and 2+ on the left side.       Posterior tibial pulses are 2+ on the right side and 2+ on  the left side.     Heart sounds: Murmur heard.     Diastolic murmur is present.  Pulmonary:     Effort: Pulmonary effort is normal. No respiratory distress.     Breath sounds: Normal breath sounds.  Abdominal:     General: There is no distension.     Palpations: Abdomen is soft.     Tenderness: There is no abdominal tenderness.  Musculoskeletal:     Right lower leg: No edema.     Left lower leg: No edema.  Skin:    General: Skin is warm and dry.  Neurological:     General: No focal deficit present.     Comments: Neuro: Speech is clear, able to follow commands. CN III-XII intact grossly intact. PERRLA. EOMI. Sensation intact throughout. Str 5/5 all extremities.     ED Results / Procedures / Treatments   Labs (all labs ordered are listed, but only abnormal results are displayed) Labs Reviewed  BASIC METABOLIC PANEL  CBC  URINALYSIS, ROUTINE W REFLEX MICROSCOPIC  CBG MONITORING, ED  TROPONIN I (HIGH SENSITIVITY)    EKG None  Radiology No results found.  Procedures Procedures    Medications Ordered in ED Medications  sodium chloride 0.9 % bolus 500 mL (has no administration in time range)    ED Course/ Medical Decision Making/ A&P                           Medical Decision Making Amount and/or Complexity of Data Reviewed Labs: ordered.  This patient is a 81 y.o. male  who presents to the ED for concern of near syncopal episode about 30 min prior to ER arrival. No head trauma. Prodrome of dizziness, no chest pain or headache.    Differential diagnoses prior to evaluation: The emergent differential diagnosis includes, but is not limited to,  CVA, ACS, arrhythmia, vasovagal syncope, orthostatic hypotension, sepsis, hypoglycemia, electrolyte disturbance, respiratory failure, symptomatic anemia, dehydration, heat injury, polypharmacy, malignancy, anxiety/panic attack. This is not an exhaustive differential.   Past Medical History / Co-morbidities: Hypertension,  hypothyroidism, liver cirrhosis, chronic kidney disease, stable AAA, CAD, CHF with EF 25-30%, afib, heart murmur  Additional history: Chart reviewed. Pertinent results include: CHF with EF 25-30% and mild regurgitation per echocardiogram in February 2023  Physical Exam: Physical exam performed. The pertinent findings include: Patient appears tired but is awake and cooperative.  Strong pulses.  Diastolic murmur to cardiac auscultation consistent with regurgitation.  Normal neurologic exam as above.  Lab Tests/Imaging studies: I personally ordered the following labs: CBC, BMP, urinalysis, troponin, EKG   Medications: I ordered medication including IV  fluids.  I have reviewed the patients home medicines  and have made adjustments as needed.   Disposition: Patient discussed and care transferred to attending physician Dr. Pearline Cables at shift change. Please see his/her note for further details regarding further ED course and disposition. Plan at time of handoff is await labs and likely admit for syncopal workup.   Final Clinical Impression(s) / ED Diagnoses Final diagnoses:  None    Rx / DC Orders ED Discharge Orders     None      Portions of this report may have been transcribed using voice recognition software. Every effort was made to ensure accuracy; however, inadvertent computerized transcription errors may be present.    Sanye Ledesma T, PA-C 71/16/57 [id number]    Campbell Stall P, DO 33/38/32 [id number]

## 2021-09-07 NOTE — ED Triage Notes (Signed)
Pt BIB EMS after coming home from dialysis (L arm restricted). Patient states he felt both dizzy and weak, but did not loose consciousness. Pt denies CP/N/V. EMS was called by pt's son. Upon arrival, BP was 76 palpated.BP came up with EMS to 82 palpated after starting a fluid bolus.   VSS besides BP.

## 2021-09-07 NOTE — ED Notes (Signed)
Phlebotomy at bedside to obtain labs.

## 2021-09-07 NOTE — ED Notes (Signed)
Patient states he does not make urine, unable to collect UA.

## 2021-09-07 NOTE — ED Provider Notes (Signed)
Blood pressure (!) 111/50, pulse (!) 58, temperature 98 F (36.7 C), temperature source Oral, resp. rate 15, SpO2 100 %.  Assuming care from Dr. Campbell Stall.  In short, Ricky Harris is a 81 y.o. male with a chief complaint of Dizziness and Weakness .  Refer to the original H&P for additional details.  The current plan of care is to follow up on repeat troponin. BP improved.   02:05 AM  Repeat troponin unchanged at 30 from 29.  No indication for emergent dialysis.  Blood pressure has remained within normal limits here in the ED with no systolics below 678 since initial IVF. Considered admit but patient feeling well and workup/obs here in the ED is reassuring. Plan for d/c with plan to continue outpatient HD and PCP follow up.    Margette Fast, MD 09/08/21 [phone]

## 2021-09-08 LAB — TROPONIN I (HIGH SENSITIVITY): Troponin I (High Sensitivity): 30 ng/L — ABNORMAL HIGH (ref <18)

## 2021-09-08 NOTE — Discharge Instructions (Signed)
You were seen in the emergency department today with lightheadedness.  I suspect this was related to taking a bit too much fluid off during dialysis.  Your blood pressures have normalized here and your blood work is reassuring.  Please continue your outpatient dialysis appointments and follow with your primary care doctor.  Return with any new or suddenly worsening symptoms.

## 2021-09-10 ENCOUNTER — Encounter: Payer: Self-pay | Admitting: Cardiology

## 2021-09-10 ENCOUNTER — Ambulatory Visit (INDEPENDENT_AMBULATORY_CARE_PROVIDER_SITE_OTHER): Payer: Medicare Other | Admitting: Cardiology

## 2021-09-10 VITALS — BP 102/56 | HR 93 | Resp 20 | Ht 72.0 in | Wt 169.2 lb

## 2021-09-10 DIAGNOSIS — I5042 Chronic combined systolic (congestive) and diastolic (congestive) heart failure: Secondary | ICD-10-CM | POA: Diagnosis not present

## 2021-09-10 DIAGNOSIS — I480 Paroxysmal atrial fibrillation: Secondary | ICD-10-CM | POA: Diagnosis not present

## 2021-09-10 DIAGNOSIS — I25118 Atherosclerotic heart disease of native coronary artery with other forms of angina pectoris: Secondary | ICD-10-CM | POA: Diagnosis not present

## 2021-09-10 DIAGNOSIS — K7469 Other cirrhosis of liver: Secondary | ICD-10-CM | POA: Diagnosis not present

## 2021-09-10 DIAGNOSIS — Z992 Dependence on renal dialysis: Secondary | ICD-10-CM

## 2021-09-10 DIAGNOSIS — N186 End stage renal disease: Secondary | ICD-10-CM

## 2021-09-10 NOTE — Patient Instructions (Addendum)
Hold your isosorbide (Imdur) and spironolactone (aldactone) on your dialysis days. If his BP is low < 100 I would hold Toprol that day as well

## 2021-09-20 IMAGING — CT CT ABD-PELV W/O CM
1 of 3 series · 12 of 32 positions shown, 16 images · non-contrast
Comparison: CT abdomen pelvis - 04/25/2018; 05/16/2017

CLINICAL DATA: Follow-up abdominal aortic aneurysm. History of
prostate cancer, post prostatectomy. History of gastrectomy.

EXAM:
CT ABDOMEN AND PELVIS WITHOUT CONTRAST
TECHNIQUE: Multidetector CT imaging of the abdomen and pelvis was performed
following the standard protocol without IV contrast.

[Series 4: thins requested by md · axial · 0.83mm/px · z∈[-392,+58]mm · 12 of 855 slices shown, 16 images]
[im 69/855  soft-tissue]
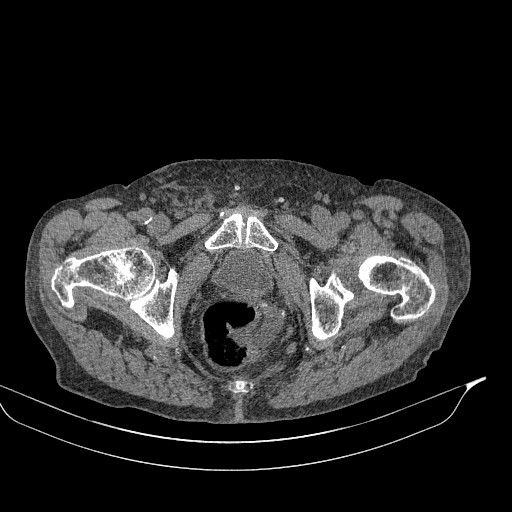
[im 69/855  bone]
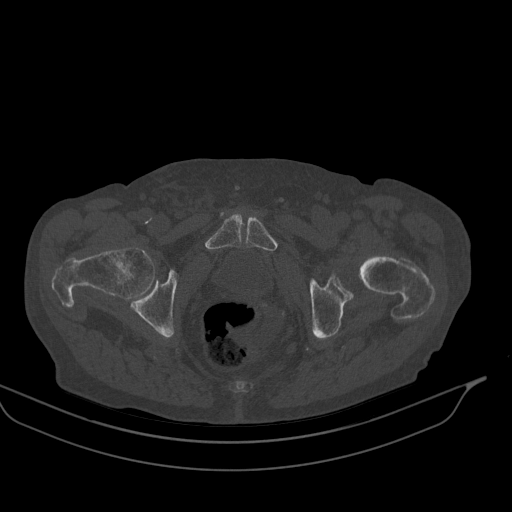
[im 137/855  soft-tissue]
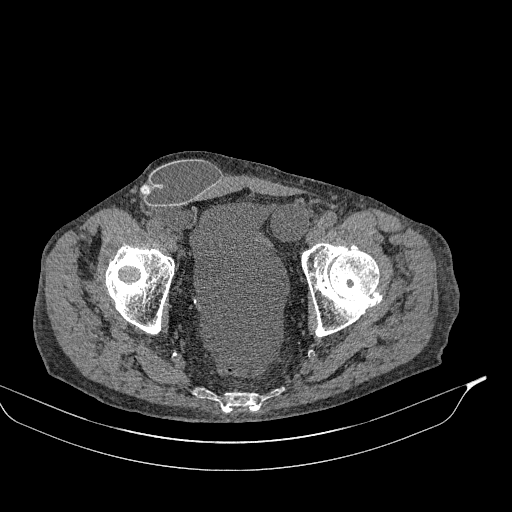
[im 240/855  soft-tissue]
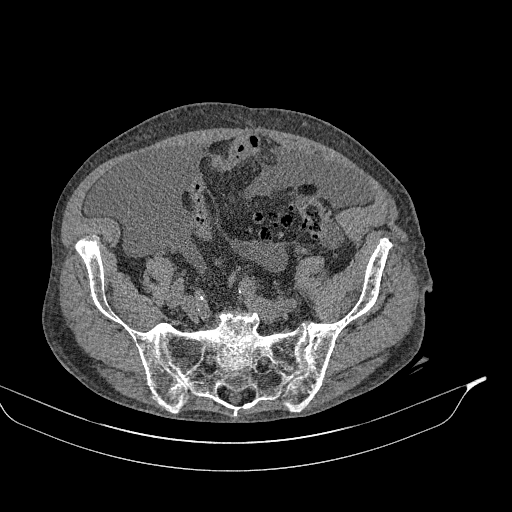
[im 308/855  soft-tissue]
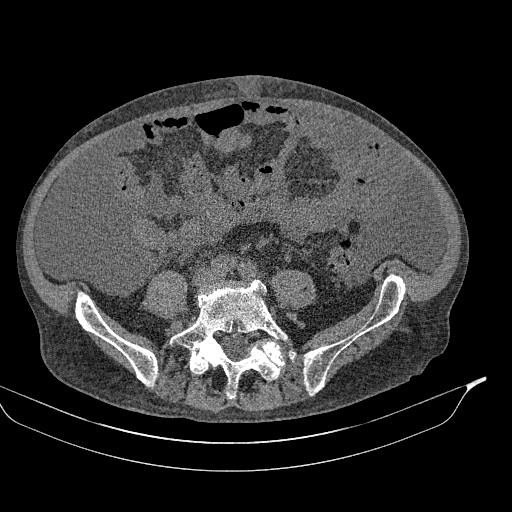
[im 376/855  soft-tissue]
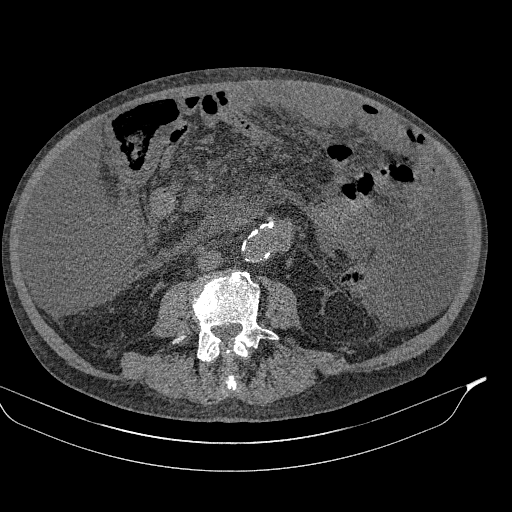
[im 479/855  soft-tissue]
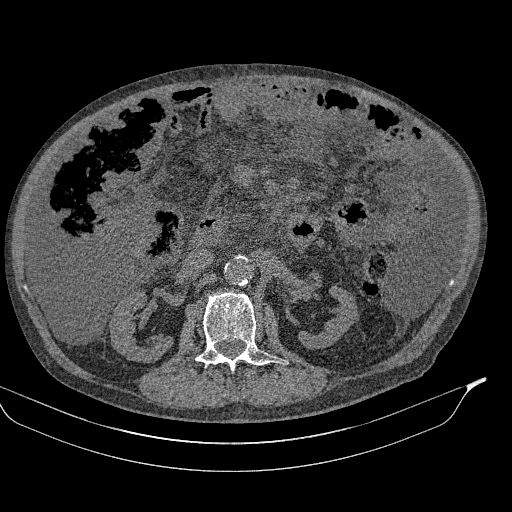
[im 547/855  soft-tissue]
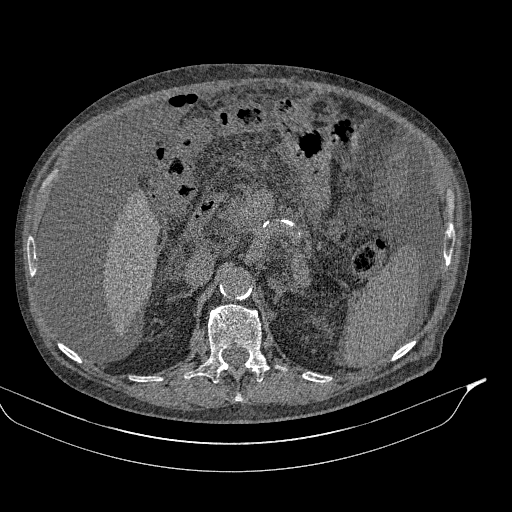
[im 650/855  soft-tissue]
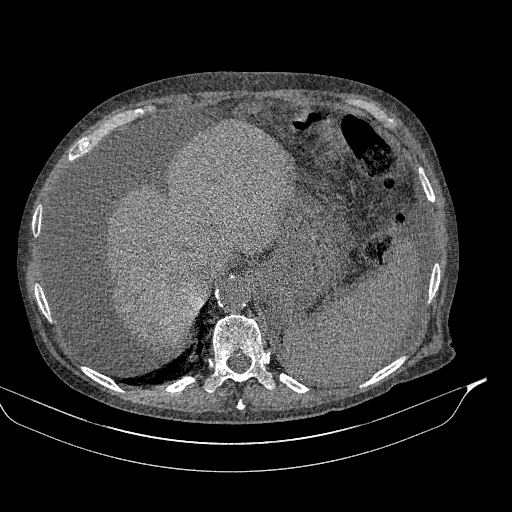
[im 718/855  soft-tissue]
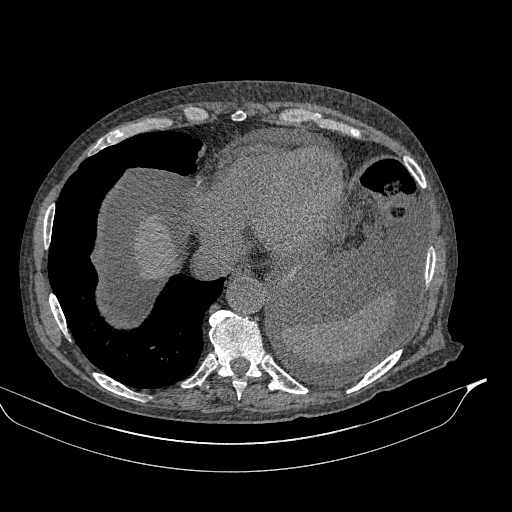
[im 718/855  lung]
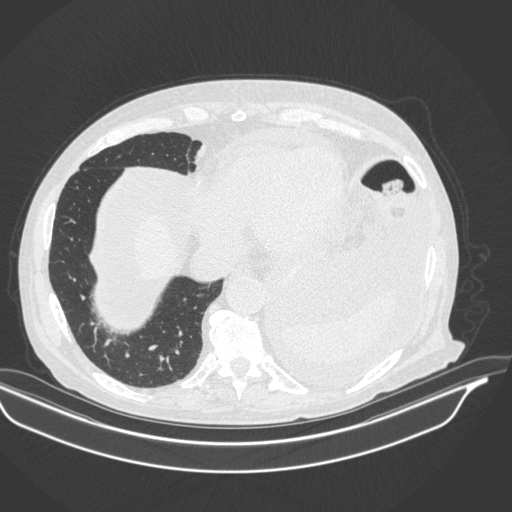
[im 718/855  bone]
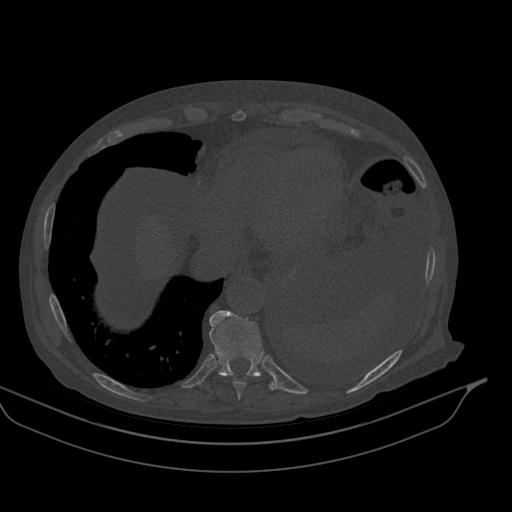
[im 752/855  lung]
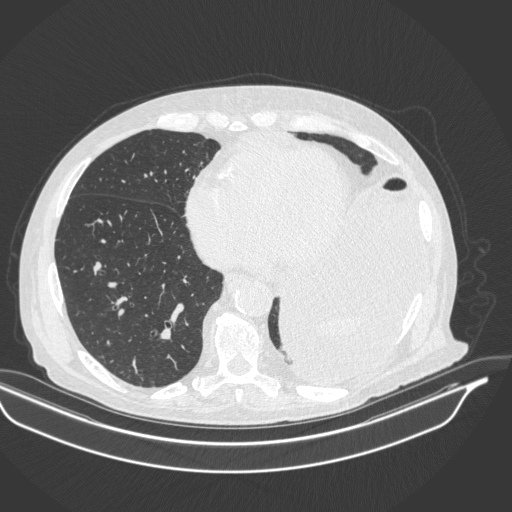
[im 786/855  soft-tissue]
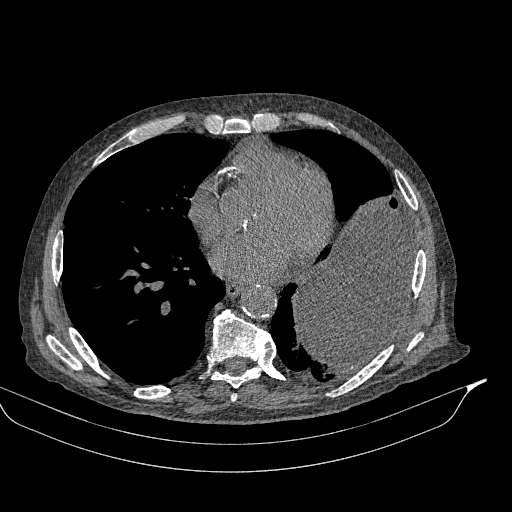
[im 786/855  lung]
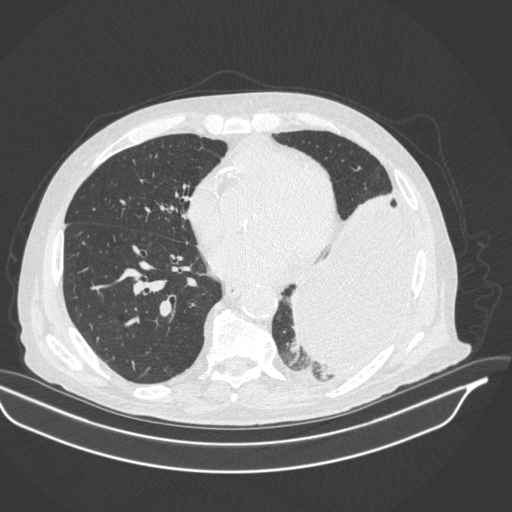
[im 820/855  lung]
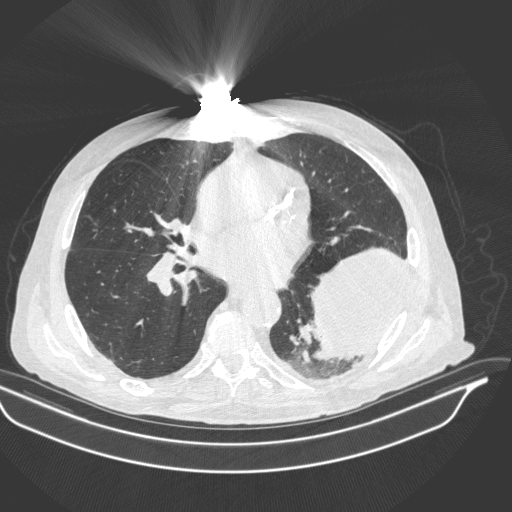

[12 of 32 positions shown; findings below may reference images not displayed]

FINDINGS: The lack of intravenous contrast limits the ability to evaluate
solid abdominal organs as well as intra-abdominal vascular
structures.

Lower chest: Limited visualization of the lower thorax demonstrates
grossly unchanged mild elevation/eventration of left hemidiaphragm
with associated left basilar opacities and partial collapse and left
basilar pleuroparenchymal thickening.

Normal heart size. Interval development of a small pericardial
effusion. Coronary calcifications. There is diffuse decreased
attenuation of the intra cardiac blood pool suggestive of anemia.

Hepatobiliary: The right lobe of the liver appears shrunken with
nodularity hepatic contour suggestive of cirrhosis. This finding is
again associated with splenomegaly and moderate volume
intra-abdominal ascites. Post cholecystectomy.

Pancreas: Pancreas appears slightly atrophic on this noncontrast
examination.

Spleen: Redemonstrated splenomegaly with the spleen measuring
cm in diameter (image 30, series 2).

Adrenals/Urinary Tract: The bilateral kidneys remain atrophic
hypoattenuating renal lesions are seen bilaterally the right-side of
which measures approximately 2.3 cm in diameter (image 49, series 2
and the left side measuring 1.1 cm (image 42), incompletely
characterized on this noncontrast examination though statistically
representative of renal cysts. There is a punctate (approximately 3
mm) nonobstructing stone within the inferior pole the right kidney
(image 55, series 2). No left-sided nephrolithiasis. No renal stones
are seen along expected course of either ureter or within the
urinary bladder. Normal noncontrast appearance of the urinary
bladder given underdistention.

Stomach/Bowel: Scattered colonic diverticulosis without evidence of
superimposed acute diverticulitis on this noncontrast examination.
The sigmoid colon is noted to be redundant. Moderate colonic stool
burden. No definitive areas of bowel wall thickening given lack of
intravenous contrast and moderate volume intra-abdominal ascites. No
pneumoperitoneum, pneumatosis or portal venous gas.

Vascular/Lymphatic: Interval increase in size of eccentric abdominal
aortic aneurysm now measuring approximately 5.3 x 5.5 x 5.3 cm as
measured in greatest oblique short axis axial (image 53, series 2),
coronal (image 49, series 5) and sagittal (image 74, series 6)
dimensions respectively, previously, 4.9 x 5.0 x 5.1 cm when
compared to the [DATE] examination and 4.1 x 4.3 x 4.1 cm when
compared to the [DATE] examination.

This finding is associated with development of high density mural
thrombus about the anterior left lateral aspect of the aneurysm
(axial image 53, series 2; coronal image 47, series 5), as could be
seen in the setting intramural hematoma. No definitive associated
periaortic stranding though evaluation degraded secondary to the
presence of intra-abdominal ascites.

The aneurysm is noted to arise approximately 2.4 cm caudal to the
take-off of the most inferior left renal artery and tapers to a
normal caliber approximately 4.1 cm superior to the aortic
bifurcation (coronal image 55, series 5).

Moderate amount eccentric calcified atherosclerotic plaque
throughout the abdominal aorta.

No bulky retroperitoneal mesenteric, pelvic or inguinal
lymphadenopathy on this noncontrast examination.

Reproductive: Post prostatectomy with penile prosthesis.

Other: Regional soft tissues appear normal.

Musculoskeletal: No acute or aggressive osseous abnormalities. Grade
1 anterolisthesis of L4 upon L5 without associated pars defects.
Mild-to-moderate multilevel lumbar spine DDD, worse at L4-L5 with
disc space height loss, endplate irregularity and sclerosis.
Stigmata of DISH within the thoracic spine. Moderate to severe
degenerative change the bilateral hips, left greater than right with
near complete joint space loss, subchondral sclerosis, osteophytosis
and subchondral cyst formation. No evidence of avascular necrosis.
IMPRESSION: 1. Continued increased size of infrarenal abdominal aortic aneurysm,
currently measuring 5.5 cm in maximal diameter, previously, 5.1 cm
when compared to the [DATE] examination and 4.3 cm when compared to
the [DATE] examination. This finding is associated with development
of high density mural thrombus within the dominant component of the
aneurysm, which is suboptimally evaluated on this noncontrast
examination, though could be seen in the setting of acute intramural
hematoma. No definitive periaortic stranding though evaluation
degraded secondary to presence of moderate volume intra-abdominal
ascites. Aortic aneurysm NOS (QA2Y2-ODR.A).
2. Coronary artery calcifications. Aortic Atherosclerosis
(QA2Y2-BAW.W).
3. Similar findings of cirrhosis and moderate volume intra-abdominal
ascites.
4. Interval development of small pericardial effusion. Further
evaluation cardiac echo could be performed as indicated.
5. Suspected anemia.
6. Colonic diverticulosis without evidence superimposed acute
diverticulitis on this noncontrast examination.
7. Post prostatectomy.

These results will be called to the ordering clinician or
representative by the Radiologist Assistant, and communication
documented in the PACS or zVision Dashboard.

## 2021-10-23 ENCOUNTER — Emergency Department (HOSPITAL_COMMUNITY): Payer: Medicare Other

## 2021-10-23 ENCOUNTER — Emergency Department (HOSPITAL_COMMUNITY)
Admission: EM | Admit: 2021-10-23 | Discharge: 2021-10-24 | Disposition: A | Discharge status: Home / Self Care | Payer: Medicare Other | Attending: Emergency Medicine | Admitting: Emergency Medicine

## 2021-10-23 ENCOUNTER — Other Ambulatory Visit: Payer: Self-pay

## 2021-10-23 ENCOUNTER — Encounter (HOSPITAL_COMMUNITY): Payer: Self-pay | Admitting: *Deleted

## 2021-10-23 DIAGNOSIS — I2510 Atherosclerotic heart disease of native coronary artery without angina pectoris: Secondary | ICD-10-CM | POA: Diagnosis not present

## 2021-10-23 DIAGNOSIS — M79602 Pain in left arm: Secondary | ICD-10-CM | POA: Diagnosis not present

## 2021-10-23 DIAGNOSIS — Z7902 Long term (current) use of antithrombotics/antiplatelets: Secondary | ICD-10-CM | POA: Diagnosis not present

## 2021-10-23 DIAGNOSIS — Z7901 Long term (current) use of anticoagulants: Secondary | ICD-10-CM | POA: Insufficient documentation

## 2021-10-23 DIAGNOSIS — N184 Chronic kidney disease, stage 4 (severe): Secondary | ICD-10-CM | POA: Insufficient documentation

## 2021-10-23 DIAGNOSIS — D72829 Elevated white blood cell count, unspecified: Secondary | ICD-10-CM | POA: Insufficient documentation

## 2021-10-23 DIAGNOSIS — Z79899 Other long term (current) drug therapy: Secondary | ICD-10-CM | POA: Diagnosis not present

## 2021-10-23 DIAGNOSIS — E039 Hypothyroidism, unspecified: Secondary | ICD-10-CM | POA: Insufficient documentation

## 2021-10-23 DIAGNOSIS — R42 Dizziness and giddiness: Secondary | ICD-10-CM | POA: Diagnosis present

## 2021-10-23 DIAGNOSIS — I4891 Unspecified atrial fibrillation: Secondary | ICD-10-CM | POA: Diagnosis not present

## 2021-10-23 DIAGNOSIS — I129 Hypertensive chronic kidney disease with stage 1 through stage 4 chronic kidney disease, or unspecified chronic kidney disease: Secondary | ICD-10-CM | POA: Diagnosis not present

## 2021-10-23 DIAGNOSIS — Z8546 Personal history of malignant neoplasm of prostate: Secondary | ICD-10-CM | POA: Insufficient documentation

## 2021-10-23 LAB — CBC WITH DIFFERENTIAL/PLATELET
Abs Immature Granulocytes: 0.15 10*3/uL — ABNORMAL HIGH (ref 0.00–0.07)
Basophils Absolute: 0.1 10*3/uL (ref 0.0–0.1)
Basophils Relative: 1 %
Eosinophils Absolute: 0.1 10*3/uL (ref 0.0–0.5)
Eosinophils Relative: 1 %
HCT: 36.3 % — ABNORMAL LOW (ref 39.0–52.0)
Hemoglobin: 12.1 g/dL — ABNORMAL LOW (ref 13.0–17.0)
Immature Granulocytes: 1 %
Lymphocytes Relative: 9 %
Lymphs Abs: 1.1 10*3/uL (ref 0.7–4.0)
MCH: 34.7 pg — ABNORMAL HIGH (ref 26.0–34.0)
MCHC: 33.3 g/dL (ref 30.0–36.0)
MCV: 104 fL — ABNORMAL HIGH (ref 80.0–100.0)
Monocytes Absolute: 1.1 10*3/uL — ABNORMAL HIGH (ref 0.1–1.0)
Monocytes Relative: 9 %
Neutro Abs: 9.4 10*3/uL — ABNORMAL HIGH (ref 1.7–7.7)
Neutrophils Relative %: 79 %
Platelets: 193 10*3/uL (ref 150–400)
RBC: 3.49 MIL/uL — ABNORMAL LOW (ref 4.22–5.81)
RDW: 15.9 % — ABNORMAL HIGH (ref 11.5–15.5)
WBC: 11.8 10*3/uL — ABNORMAL HIGH (ref 4.0–10.5)
nRBC: 0.2 % (ref 0.0–0.2)

## 2021-10-23 LAB — COMPREHENSIVE METABOLIC PANEL
ALT: 68 U/L — ABNORMAL HIGH (ref 0–44)
AST: 43 U/L — ABNORMAL HIGH (ref 15–41)
Albumin: 3.5 g/dL (ref 3.5–5.0)
Alkaline Phosphatase: 298 U/L — ABNORMAL HIGH (ref 38–126)
Anion gap: 14 (ref 5–15)
BUN: 33 mg/dL — ABNORMAL HIGH (ref 8–23)
CO2: 26 mmol/L (ref 22–32)
Calcium: 9.4 mg/dL (ref 8.9–10.3)
Chloride: 95 mmol/L — ABNORMAL LOW (ref 98–111)
Creatinine, Ser: 4.79 mg/dL — ABNORMAL HIGH (ref 0.61–1.24)
GFR, Estimated: 12 mL/min — ABNORMAL LOW (ref >60)
Glucose, Bld: 111 mg/dL — ABNORMAL HIGH (ref 70–99)
Potassium: 4.4 mmol/L (ref 3.5–5.1)
Sodium: 135 mmol/L (ref 135–145)
Total Bilirubin: 0.9 mg/dL (ref 0.3–1.2)
Total Protein: 7.6 g/dL (ref 6.5–8.1)

## 2021-10-23 LAB — PROTIME-INR
INR: 1.3 — ABNORMAL HIGH (ref 0.8–1.2)
Prothrombin Time: 16.2 seconds — ABNORMAL HIGH (ref 11.4–15.2)

## 2021-10-23 LAB — LACTIC ACID, PLASMA: Lactic Acid, Venous: 1.4 mmol/L (ref 0.5–1.9)

## 2021-10-23 LAB — APTT: aPTT: 31 seconds (ref 24–36)

## 2021-10-23 MED ORDER — SODIUM CHLORIDE 0.9 % IV BOLUS
250.0000 mL | Freq: Once | INTRAVENOUS | Status: AC
  Administered 2021-10-23: 250 mL via INTRAVENOUS

## 2021-10-23 NOTE — ED Provider Triage Note (Signed)
Emergency Medicine Provider Triage Evaluation Note  Ricky Harris , a 81 y.o. male  was evaluated in triage.  Pt complains of dizziness, started today after dialysis finished his dialysis at 3 PM today, gets it Tuesday Thursday Saturday has not missed any sessions, states his dizziness as if he is off balance, no associated headaches change in vision paresthesia or weakness of her lower extremities, states he just feels unwell, dry weight is 163 he states after dialysis he was 60 kg, no associated fevers chills cough congestion no general body aches, he notes he is having some upper abdominal pain this discharge today no nausea or vomiting still passing gas having normal bowel movements does not make urine.  Son at bedside endorses that he was fine prior to dialysis and has been more lethargic after he completed his dialysis today..  Review of Systems  Positive: Dizziness, not feeling well Negative: Chest pain, shortness of breath  Physical Exam  BP (!) 89/52   Pulse 96   Temp 98.3 F (36.8 C) (Oral)   Resp 18   SpO2 94%  Gen:   Awake, no distress   Resp:  Normal effort  MSK:   Moves extremities without difficulty  Other:  Cranial nerves II through XII grossly intact no difficulty with word finding following two-step commands no regular weakness present, alert and orient x4.  Medical Decision Making  Medically screening exam initiated at 10:42 PM.  Appropriate orders placed.  [name] was informed that the remainder of the evaluation will be completed by another provider, this initial triage assessment does not replace that evaluation, and the importance of remaining in the ED until their evaluation is complete.  Lab work has been ordered will need further work-up.  Due to his lethargic state recommended immediate rooming charge nurse was made aware and he will be next on the list.   Marcello Fennel, PA-C 10/23/21 [id number]

## 2021-10-23 NOTE — ED Triage Notes (Signed)
Pt with headache, dizziness, left upper arm pain. Pt had dialysis today, symptoms started after treatment.

## 2021-10-24 LAB — TROPONIN I (HIGH SENSITIVITY): Troponin I (High Sensitivity): 28 ng/L — ABNORMAL HIGH (ref <18)

## 2021-10-24 MED ORDER — SODIUM CHLORIDE 0.9 % IV BOLUS
250.0000 mL | Freq: Once | INTRAVENOUS | Status: AC
  Administered 2021-10-24: 250 mL via INTRAVENOUS

## 2021-10-24 NOTE — Discharge Instructions (Signed)
You are seen today for dizziness.  I feel this is likely related to having fluid pulled off during dialysis.  Make sure you attend your regularly scheduled dialysis sessions.

## 2021-10-24 NOTE — ED Notes (Signed)
Ambulated pt in hall with walker, pt had steady gait and no complaints

## 2021-10-24 NOTE — ED Provider Notes (Signed)
[facility] EMERGENCY DEPARTMENT Provider Note   CSN: [id number] Arrival date & time: 10/23/21  2213     History  Chief Complaint  Patient presents with   Dizziness    Ricky Harris is a 81 y.o. male.  HPI     This is a 81 year old male who presents with lightheadedness.  Patient reports that he developed lightheadedness after having a dialysis session today.  He does report that he had a full dialysis session.  He finished around 3 PM.  He denies room spinning dizziness, weakness, numbness, strokelike symptoms.  He does endorse some pain in the posterior aspect of his left arm.  His access is in his left arm.  He denies chest pain, shortness of breath, abdominal pain, nausea, vomiting.  Normally reports his blood pressure to be 010-071 systolic.  Today noted in triage to have a blood pressure of 89/52.  Denies fevers or infectious symptoms.  Home Medications Prior to Admission medications   Medication Sig Start Date End Date Taking? Authorizing Provider  apixaban (ELIQUIS) 5 MG TABS tablet TAKE 1 TABLET(5 MG) BY MOUTH TWICE DAILY 08/17/21   Martinique, Peter M, MD  atorvastatin (LIPITOR) 40 MG tablet Take 1 tablet (40 mg total) by mouth daily. 04/28/21   Dessa Phi, DO  calcium acetate (PHOSLO) 667 MG capsule Take 1 capsule (667 mg total) by mouth 3 (three) times daily with meals. 02/20/20   Rai, Vernelle Emerald, MD  cetirizine (ZYRTEC) 10 MG tablet Take 10 mg by mouth daily as needed for allergies.    [provider]  clopidogrel (PLAVIX) 75 MG tablet Take 1 tablet (75 mg total) by mouth daily. 04/28/21   Dessa Phi, DO  isosorbide mononitrate (IMDUR) 30 MG 24 hr tablet Take 1/2 tablet (15 mg total) by mouth daily. 04/27/21   Dessa Phi, DO  levothyroxine (SYNTHROID) 50 MCG tablet Take 1 tablet (50 mcg total) by mouth daily. 04/27/21   Dessa Phi, DO  metoprolol succinate (TOPROL-XL) 25 MG 24 hr tablet Take 1 and 1/2 tablets (37.5 mg total) by mouth  daily. 04/27/21 06/26/21  Dessa Phi, DO  multivitamin (RENA-VIT) TABS tablet Take 1 tablet by mouth daily. 09/27/19   Autry-Lott, Naaman Plummer, DO  spironolactone (ALDACTONE) 25 MG tablet Take 1/2 tablet (12.5 mg total) by mouth daily. 04/27/21   Dessa Phi, DO  traMADol (ULTRAM) 50 MG tablet Take 1 tablet (50 mg total) by mouth every 8 (eight) hours as needed for moderate pain. 03/12/19   Rhyne, Hulen Shouts, PA-C      Allergies    Cephalexin and Hydralazine    Review of Systems   Review of Systems  Constitutional:  Negative for fever.  Respiratory:  Negative for shortness of breath.   Cardiovascular:  Negative for chest pain.  Neurological:  Positive for light-headedness. Negative for weakness and headaches.  All other systems reviewed and are negative.   Physical Exam Updated Vital Signs BP (!) 94/54   Pulse 78   Temp 98.3 F (36.8 C) (Oral)   Resp 20   SpO2 96%  Physical Exam Vitals and nursing note reviewed.  Constitutional:      Appearance: He is well-developed.     Comments: Chronically ill-appearing but nontoxic  HENT:     Head: Normocephalic and atraumatic.  Eyes:     Pupils: Pupils are equal, round, and reactive to light.  Cardiovascular:     Rate and Rhythm: Normal rate and regular rhythm.     Heart  sounds: Normal heart sounds. No murmur heard.    Comments: Fistula left upper extremity with positive thrill Pulmonary:     Effort: Pulmonary effort is normal. No respiratory distress.     Breath sounds: Normal breath sounds. No wheezing.  Abdominal:     Palpations: Abdomen is soft.     Tenderness: There is no abdominal tenderness.  Musculoskeletal:     Cervical back: Neck supple.     Comments: Normal range of motion left arm, no tenderness to palpation or deformities noted, diffuse ecchymosis noted  Lymphadenopathy:     Cervical: No cervical adenopathy.  Skin:    General: Skin is warm and dry.  Neurological:     Mental Status: He is alert and oriented to  person, place, and time.     Comments: Cranial nerves II through XII intact, 5 out of 5 strength in all 4 extremities, no dysmetria to finger-nose-finger  Psychiatric:        Mood and Affect: Mood normal.     ED Results / Procedures / Treatments   Labs (all labs ordered are listed, but only abnormal results are displayed) Labs Reviewed  COMPREHENSIVE METABOLIC PANEL - Abnormal; Notable for the following components:      Result Value   Chloride 95 (*)    Glucose, Bld 111 (*)    BUN 33 (*)    Creatinine, Ser 4.79 (*)    AST 43 (*)    ALT 68 (*)    Alkaline Phosphatase 298 (*)    GFR, Estimated 12 (*)    All other components within normal limits  CBC WITH DIFFERENTIAL/PLATELET - Abnormal; Notable for the following components:   WBC 11.8 (*)    RBC 3.49 (*)    Hemoglobin 12.1 (*)    HCT 36.3 (*)    MCV 104.0 (*)    MCH 34.7 (*)    RDW 15.9 (*)    Neutro Abs 9.4 (*)    Monocytes Absolute 1.1 (*)    Abs Immature Granulocytes 0.15 (*)    All other components within normal limits  PROTIME-INR - Abnormal; Notable for the following components:   Prothrombin Time 16.2 (*)    INR 1.3 (*)    All other components within normal limits  TROPONIN I (HIGH SENSITIVITY) - Abnormal; Notable for the following components:   Troponin I (High Sensitivity) 28 (*)    All other components within normal limits  CULTURE, BLOOD (ROUTINE X 2)  CULTURE, BLOOD (ROUTINE X 2)  LACTIC ACID, PLASMA  APTT  TROPONIN I (HIGH SENSITIVITY)    EKG EKG Interpretation  Date/Time:  Saturday October 23 2021 22:26:48 EDT Ventricular Rate:  93 PR Interval:  146 QRS Duration: 106 QT Interval:  360 QTC Calculation: 447 R Axis:   -56 Text Interpretation: Normal sinus rhythm Left axis deviation Pulmonary disease pattern Minimal voltage criteria for LVH, may be normal variant ( Cornell product ) Septal infarct , age undetermined Abnormal ECG When compared with ECG of 07-Sep-2021 17:29, PREVIOUS ECG IS PRESENT  Confirmed by Thayer Jew ([id number]) on 10/23/2021 11:18:42 PM  Radiology DG Chest Port 1 View  Result Date: 10/23/2021 CLINICAL DATA:  Questionable sepsis EXAM: PORTABLE CHEST 1 VIEW COMPARISON:  Chest x-ray 04/25/2021 FINDINGS: The heart size and mediastinal contours are within normal limits. Both lungs are clear. There is mild elevation of the left hemidiaphragm as seen on prior. The visualized skeletal structures are unremarkable. IMPRESSION: No active disease. Electronically Signed   By: Warren Lacy  Dagoberto Reef M.D.   On: 10/23/2021 23:05    Procedures Procedures    Medications Ordered in ED Medications  sodium chloride 0.9 % bolus 250 mL (0 mLs Intravenous Stopped 10/24/21 0018)  sodium chloride 0.9 % bolus 250 mL (0 mLs Intravenous Stopped 10/24/21 0334)    ED Course/ Medical Decision Making/ A&P Clinical Course as of 10/24/21 0507  Sun Oct 24, 2021  0445 Patient states he feels much better.  He received a total of 500 cc of fluid over 2 hours.  His repeat blood pressures have been improved and he ambulated without dizziness.  Additional work-up is unremarkable.  Plan for discharge.  Discussed with patient that he should follow his regularly scheduled dialysis. [CH]    Clinical Course User Index [CH] Bertel Venard, Barbette Hair, MD                           Medical Decision Making  This patient presents to the ED for concern of lightheadedness, this involves an extensive number of treatment options, and is a complaint that carries with it a high risk of complications and morbidity.  I considered the following differential and admission for this acute, potentially life threatening condition.  The differential diagnosis includes vertigo central versus peripheral, orthostasis, arrhythmia  MDM:    This is a 80 year old male who presents with lightheadedness.  He felt well before his dialysis today.  He is unsure what dry weight is supposed to be.  He is notably hypotensive.  He is afebrile.  He does  report some left arm pain.  This seems musculoskeletal in nature.  There is a positive thrill over his fistula.  Will obtain chest pain work-up to ensure that this is not an anginal equivalent although I feel this is unlikely.  Patient was initially given 250 cc of fluid over 1 hour.  Blood pressures remained soft and he remains symptomatic.  He tolerated this infusion well.  Repeat infusion of 250 cc was obtained.  Labs notable for slight leukocytosis.  Metabolic derangements as above.  Troponin 28 and comparable to priors.  EKG without acute ischemic arrhythmic changes.  Chest x-ray is reassuring.  On recheck, patient feels much better and is ambulatory without dizziness after second fluid bolus.  Highly suspect he may have been slightly on the dry side after dialysis.  Recommend that he follow his regularly scheduled dialysis and follow-up with his nephrologist.  (Labs, imaging, consults)  Labs: I Ordered, and personally interpreted labs.  The pertinent results include: CBC, BMP, troponin  Imaging Studies ordered: I ordered imaging studies including chest x-ray I independently visualized and interpreted imaging. I agree with the radiologist interpretation  Additional history obtained from son at the bedside.  External records from outside source obtained and reviewed including prior evaluations  Cardiac Monitoring: The patient was maintained on a cardiac monitor.  I personally viewed and interpreted the cardiac monitored which showed an underlying rhythm of: Sinus rhythm  Reevaluation: After the interventions noted above, I reevaluated the patient and found that they have :improved  Social Determinants of Health: Lives independently, end-stage renal disease  Disposition: Discharge  Co morbidities that complicate the patient evaluation  Past Medical History:  Diagnosis Date   A-fib ([place]) 09/18/2020   AAA (abdominal aortic aneurysm) ([place])    5.3cm , magd by vascular Dr Scot Dock ,    Anemia    " slightly"   Aortic stenosis    Arthritis  knees   Ascites    Cancer (HCC)    prostate   Chronic kidney disease    stage 4, mgd by Dr Justin Mend    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)    Coronary artery disease    on CT scan   Gout    H/O hypercholesterolemia    Heart murmur    History of falling    recent- see ct chest results of 04/25/2018   Hypertension    Hypothyroidism    Liver cirrhosis ([place])    Prediabetes    pt denies    Renal artery stenosis (HCC)    Thrombocytopenia (HCC)    Wears glasses    Wears glasses      Medicines Meds ordered this encounter  Medications   sodium chloride 0.9 % bolus 250 mL   sodium chloride 0.9 % bolus 250 mL    I have reviewed the patients home medicines and have made adjustments as needed  Problem List / ED Course: Problem List Items Addressed This Visit   None Visit Diagnoses     Orthostatic dizziness    -  Primary                   Final Clinical Impression(s) / ED Diagnoses Final diagnoses:  Orthostatic dizziness    Rx / DC Orders ED Discharge Orders     None         Merryl Hacker, MD 10/24/21 0510

## 2021-10-28 LAB — CULTURE, BLOOD (ROUTINE X 2)
Culture: NO GROWTH
Culture: NO GROWTH
Special Requests: ADEQUATE
Special Requests: ADEQUATE

## 2021-11-03 IMAGING — US IR PARACENTESIS
1 series · 4 of 4 positions shown · non-contrast
Comparison: none

INDICATION: Patient with history of cirrhosis, chronic kidney disease, recurrent
ascites. Request is made for therapeutic paracentesis of up to 8 L
maximum.

[Series 1: ir paracentesis · 4 of 4 slices shown]
[im 1/4]
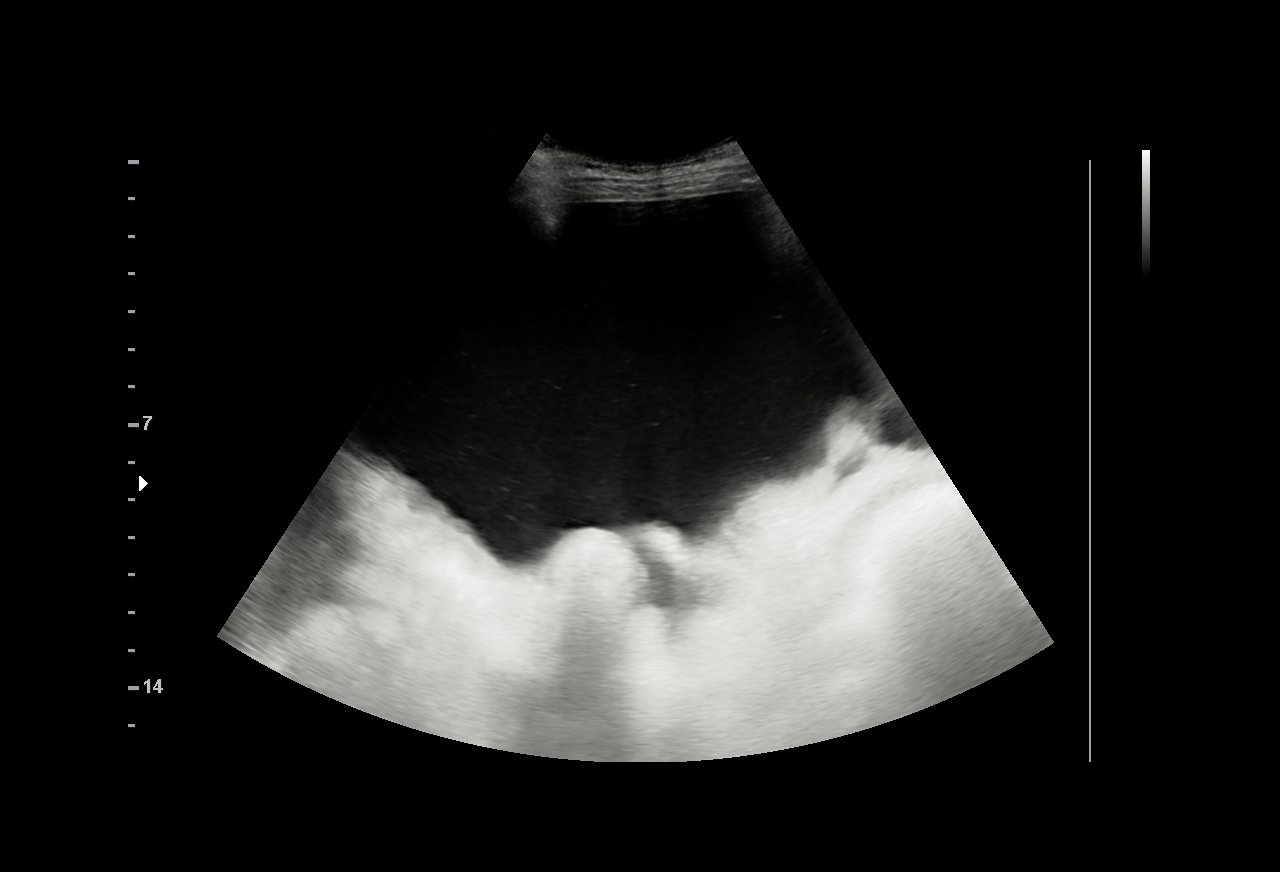
[im 2/4]
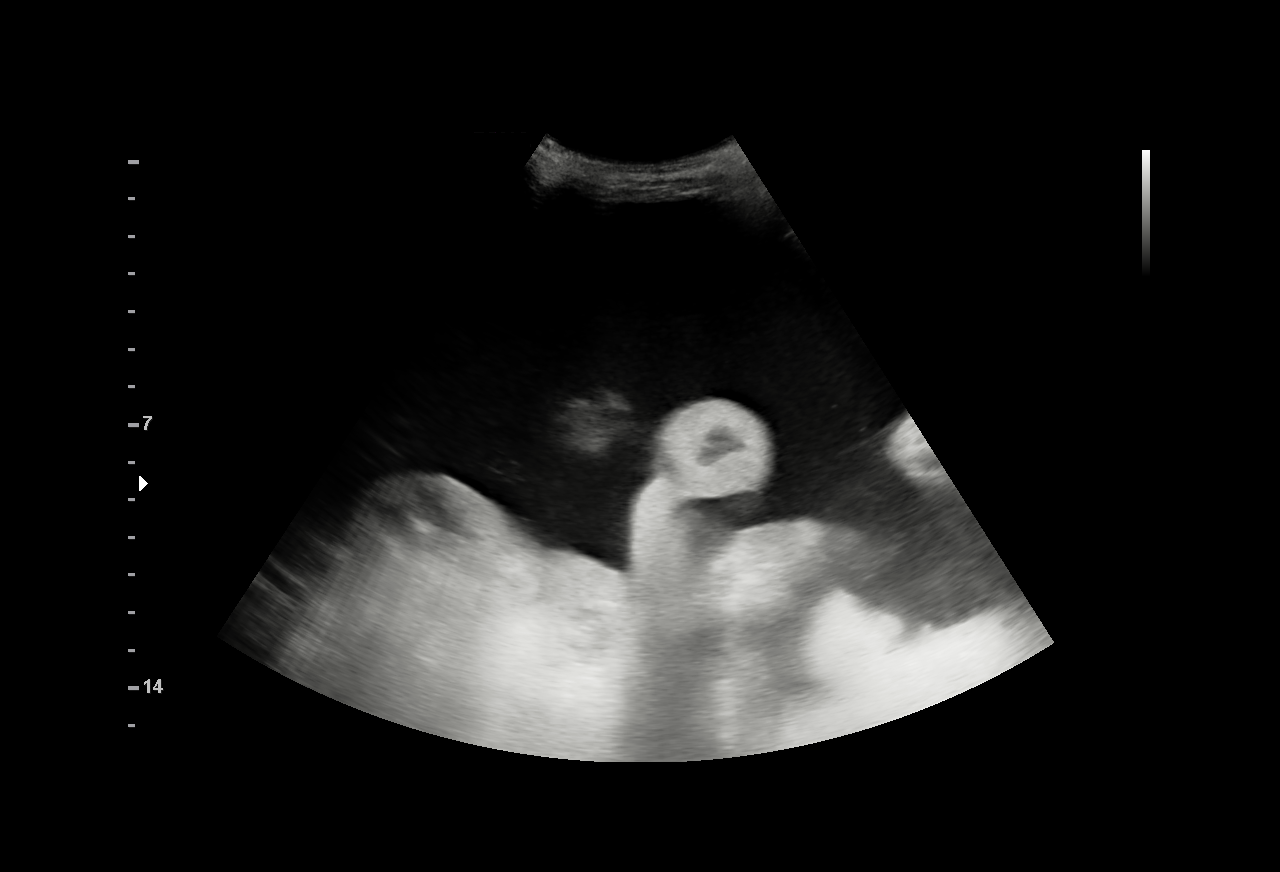
[im 3/4]
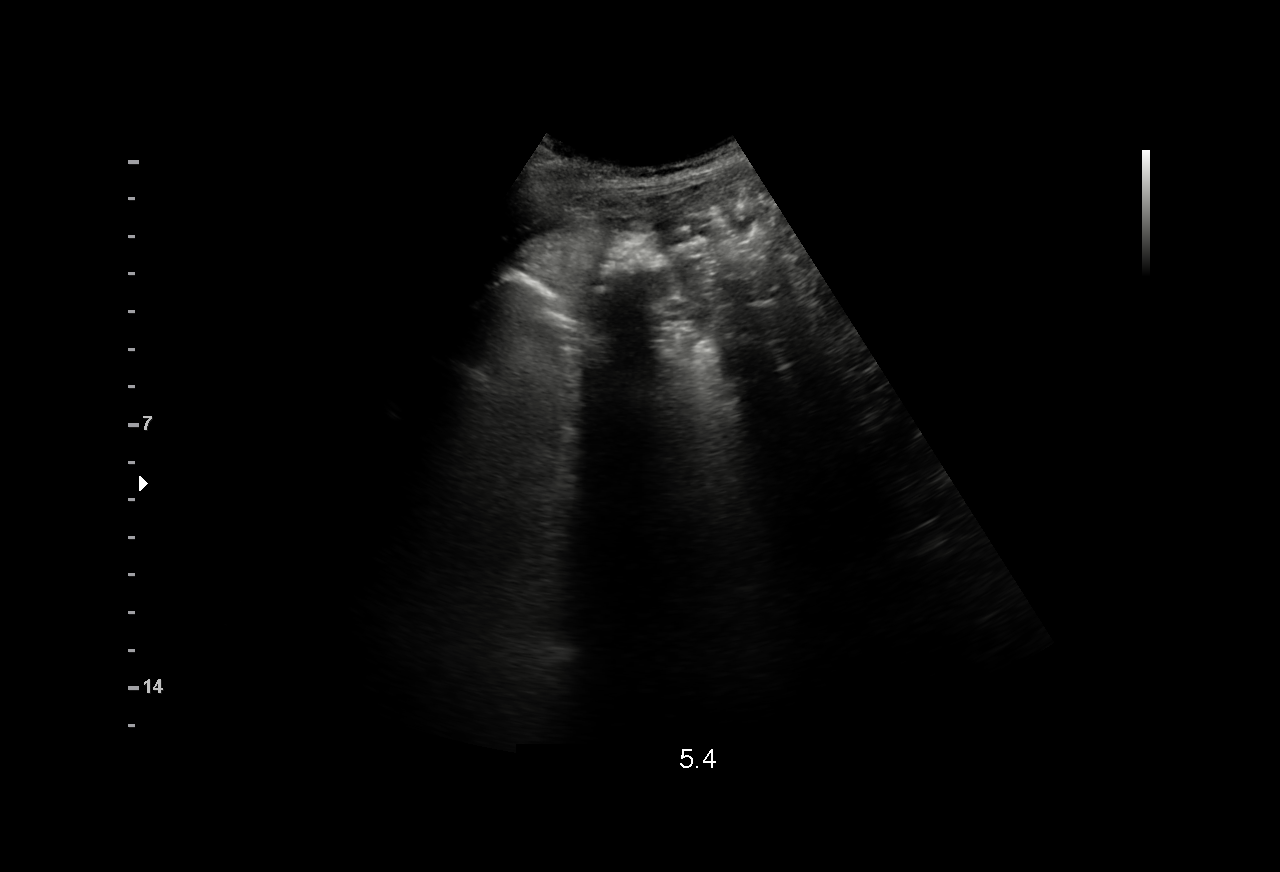
[im 4/4]
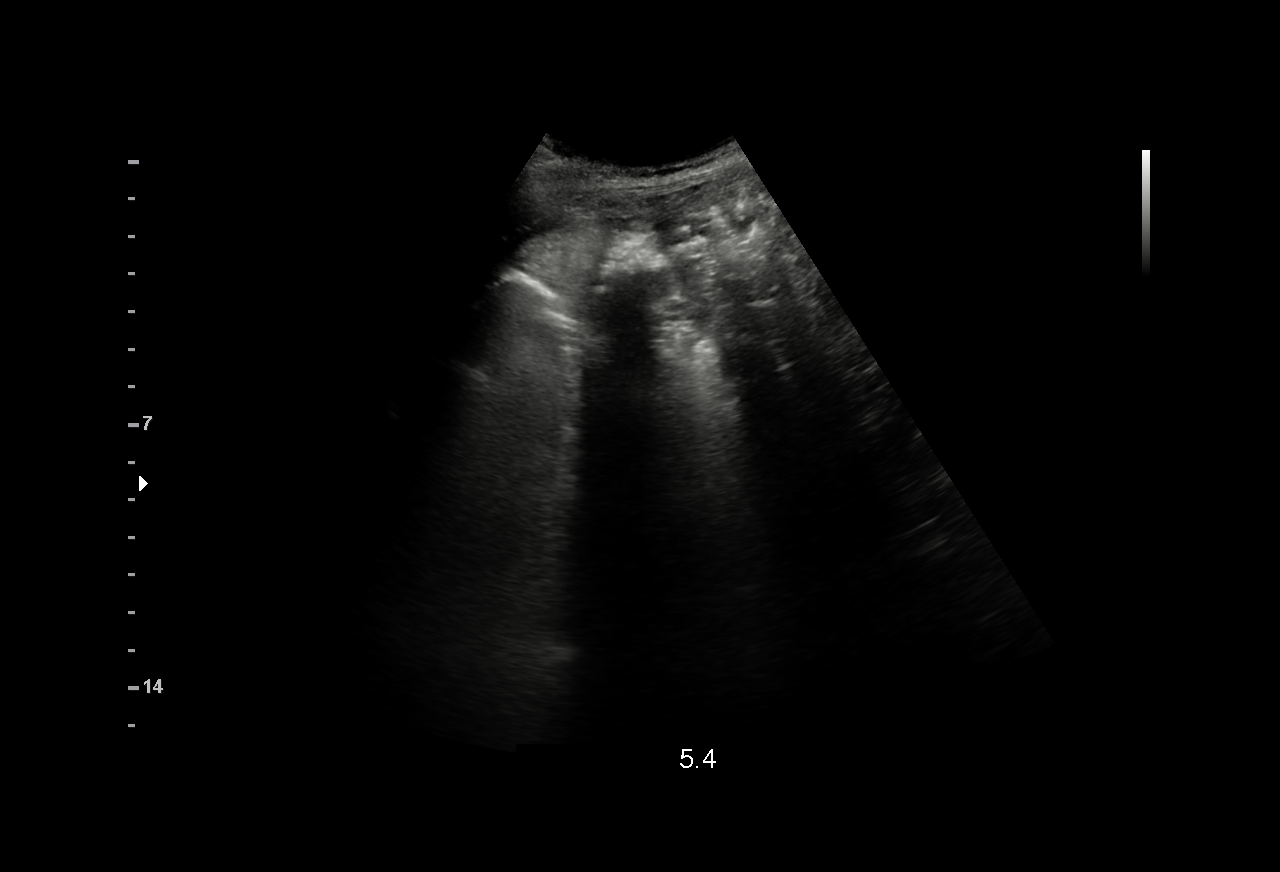

[4 of 4 positions shown; findings below may reference images not displayed]

EXAM:
ULTRASOUND GUIDED THERAPEUTIC PARACENTESIS

MEDICATIONS:
10 mL 1% lidocaine

COMPLICATIONS:
None immediate.

PROCEDURE:
Informed written consent was obtained from the patient after a
discussion of the risks, benefits and alternatives to treatment. A
timeout was performed prior to the initiation of the procedure.

Initial ultrasound scanning demonstrates a large amount of ascites
within the right lower abdominal quadrant. The right lower abdomen
was prepped and draped in the usual sterile fashion. 1% lidocaine
was used for local anesthesia.

Following this, a 19 gauge, 7-cm, Yueh catheter was introduced. An
ultrasound image was saved for documentation purposes. The
paracentesis was performed. The catheter was removed and a dressing
was applied. The patient tolerated the procedure well without
immediate post procedural complication.
FINDINGS: A total of approximately 5.4 liters of yellow fluid was removed.
IMPRESSION: Successful ultrasound-guided therapeutic paracentesis yielding
liters of peritoneal fluid.

## 2021-11-04 ENCOUNTER — Emergency Department (HOSPITAL_COMMUNITY): Payer: Medicare Other

## 2021-11-04 ENCOUNTER — Emergency Department (HOSPITAL_COMMUNITY)
Admission: EM | Admit: 2021-11-04 | Discharge: 2021-11-04 | Disposition: A | Discharge status: Home / Self Care | Payer: Medicare Other | Attending: Emergency Medicine | Admitting: Emergency Medicine

## 2021-11-04 ENCOUNTER — Encounter (HOSPITAL_COMMUNITY): Payer: Self-pay

## 2021-11-04 ENCOUNTER — Other Ambulatory Visit: Payer: Self-pay

## 2021-11-04 DIAGNOSIS — K760 Fatty (change of) liver, not elsewhere classified: Secondary | ICD-10-CM | POA: Diagnosis not present

## 2021-11-04 DIAGNOSIS — R0602 Shortness of breath: Secondary | ICD-10-CM | POA: Insufficient documentation

## 2021-11-04 DIAGNOSIS — T82330A Leakage of aortic (bifurcation) graft (replacement), initial encounter: Secondary | ICD-10-CM | POA: Insufficient documentation

## 2021-11-04 DIAGNOSIS — Z7901 Long term (current) use of anticoagulants: Secondary | ICD-10-CM | POA: Insufficient documentation

## 2021-11-04 DIAGNOSIS — I2510 Atherosclerotic heart disease of native coronary artery without angina pectoris: Secondary | ICD-10-CM | POA: Diagnosis not present

## 2021-11-04 DIAGNOSIS — R0789 Other chest pain: Secondary | ICD-10-CM | POA: Diagnosis not present

## 2021-11-04 DIAGNOSIS — Z79899 Other long term (current) drug therapy: Secondary | ICD-10-CM | POA: Insufficient documentation

## 2021-11-04 DIAGNOSIS — K5730 Diverticulosis of large intestine without perforation or abscess without bleeding: Secondary | ICD-10-CM | POA: Diagnosis not present

## 2021-11-04 DIAGNOSIS — J984 Other disorders of lung: Secondary | ICD-10-CM | POA: Diagnosis not present

## 2021-11-04 DIAGNOSIS — R161 Splenomegaly, not elsewhere classified: Secondary | ICD-10-CM | POA: Insufficient documentation

## 2021-11-04 DIAGNOSIS — R791 Abnormal coagulation profile: Secondary | ICD-10-CM | POA: Diagnosis not present

## 2021-11-04 DIAGNOSIS — N281 Cyst of kidney, acquired: Secondary | ICD-10-CM | POA: Insufficient documentation

## 2021-11-04 DIAGNOSIS — I9789 Other postprocedural complications and disorders of the circulatory system, not elsewhere classified: Secondary | ICD-10-CM

## 2021-11-04 DIAGNOSIS — N200 Calculus of kidney: Secondary | ICD-10-CM | POA: Insufficient documentation

## 2021-11-04 DIAGNOSIS — I4891 Unspecified atrial fibrillation: Secondary | ICD-10-CM | POA: Insufficient documentation

## 2021-11-04 DIAGNOSIS — N186 End stage renal disease: Secondary | ICD-10-CM | POA: Insufficient documentation

## 2021-11-04 LAB — APTT: aPTT: 33 seconds (ref 24–36)

## 2021-11-04 LAB — CBC WITH DIFFERENTIAL/PLATELET
Abs Immature Granulocytes: 0.12 10*3/uL — ABNORMAL HIGH (ref 0.00–0.07)
Basophils Absolute: 0.1 10*3/uL (ref 0.0–0.1)
Basophils Relative: 1 %
Eosinophils Absolute: 0.2 10*3/uL (ref 0.0–0.5)
Eosinophils Relative: 2 %
HCT: 33 % — ABNORMAL LOW (ref 39.0–52.0)
Hemoglobin: 10.7 g/dL — ABNORMAL LOW (ref 13.0–17.0)
Immature Granulocytes: 1 %
Lymphocytes Relative: 7 %
Lymphs Abs: 0.7 10*3/uL (ref 0.7–4.0)
MCH: 34.7 pg — ABNORMAL HIGH (ref 26.0–34.0)
MCHC: 32.4 g/dL (ref 30.0–36.0)
MCV: 107.1 fL — ABNORMAL HIGH (ref 80.0–100.0)
Monocytes Absolute: 1.1 10*3/uL — ABNORMAL HIGH (ref 0.1–1.0)
Monocytes Relative: 10 %
Neutro Abs: 8.3 10*3/uL — ABNORMAL HIGH (ref 1.7–7.7)
Neutrophils Relative %: 79 %
Platelets: 124 10*3/uL — ABNORMAL LOW (ref 150–400)
RBC: 3.08 MIL/uL — ABNORMAL LOW (ref 4.22–5.81)
RDW: 16 % — ABNORMAL HIGH (ref 11.5–15.5)
WBC: 10.5 10*3/uL (ref 4.0–10.5)
nRBC: 0 % (ref 0.0–0.2)

## 2021-11-04 LAB — COMPREHENSIVE METABOLIC PANEL
ALT: 44 U/L (ref 0–44)
AST: 42 U/L — ABNORMAL HIGH (ref 15–41)
Albumin: 3.3 g/dL — ABNORMAL LOW (ref 3.5–5.0)
Alkaline Phosphatase: 232 U/L — ABNORMAL HIGH (ref 38–126)
Anion gap: 12 (ref 5–15)
BUN: 69 mg/dL — ABNORMAL HIGH (ref 8–23)
CO2: 25 mmol/L (ref 22–32)
Calcium: 9.4 mg/dL (ref 8.9–10.3)
Chloride: 99 mmol/L (ref 98–111)
Creatinine, Ser: 8.24 mg/dL — ABNORMAL HIGH (ref 0.61–1.24)
GFR, Estimated: 6 mL/min — ABNORMAL LOW (ref >60)
Glucose, Bld: 111 mg/dL — ABNORMAL HIGH (ref 70–99)
Potassium: 4.7 mmol/L (ref 3.5–5.1)
Sodium: 136 mmol/L (ref 135–145)
Total Bilirubin: 0.6 mg/dL (ref 0.3–1.2)
Total Protein: 6.6 g/dL (ref 6.5–8.1)

## 2021-11-04 LAB — TYPE AND SCREEN
ABO/RH(D): AB POS
Antibody Screen: NEGATIVE

## 2021-11-04 LAB — TROPONIN I (HIGH SENSITIVITY)
Troponin I (High Sensitivity): 27 ng/L — ABNORMAL HIGH (ref <18)
Troponin I (High Sensitivity): 26 ng/L — ABNORMAL HIGH (ref <18)

## 2021-11-04 LAB — PROTIME-INR
INR: 1.3 — ABNORMAL HIGH (ref 0.8–1.2)
Prothrombin Time: 16.3 seconds — ABNORMAL HIGH (ref 11.4–15.2)

## 2021-11-04 LAB — LIPASE, BLOOD: Lipase: 46 U/L (ref 11–51)

## 2021-11-04 MED ORDER — IOHEXOL 300 MG/ML  SOLN
100.0000 mL | Freq: Once | INTRAMUSCULAR | Status: AC | PRN
  Administered 2021-11-04: 100 mL via INTRAVENOUS

## 2021-11-04 MED ORDER — FENTANYL CITRATE PF 50 MCG/ML IJ SOSY
25.0000 ug | PREFILLED_SYRINGE | Freq: Once | INTRAMUSCULAR | Status: AC
  Administered 2021-11-04: 25 ug via INTRAVENOUS
  Filled 2021-11-04: qty 1

## 2021-11-04 MED ORDER — IOHEXOL 350 MG/ML SOLN
100.0000 mL | Freq: Once | INTRAVENOUS | Status: AC | PRN
  Administered 2021-11-04: 100 mL via INTRAVENOUS

## 2021-11-04 MED ORDER — LIDOCAINE 5 % EX PTCH: 1.0000 | MEDICATED_PATCH | CUTANEOUS | 0 refills | Status: DC

## 2021-11-04 MED ORDER — LIDOCAINE 5 % EX PTCH
1.0000 | MEDICATED_PATCH | CUTANEOUS | Status: DC
  Administered 2021-11-04: 1 via TRANSDERMAL
  Filled 2021-11-04: qty 1

## 2021-11-04 NOTE — Consult Note (Signed)
[facility] [place]  ASSESSMENT / PLAN: 81 y.o. 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 He presents to the ER today for right upper quadrant / right lateral chest wall tenderness and dyspnea. He has no tenderness over his epigastric abdomen. His aneurysm sac is not tender. I do not think his aneurysm sac is the cause of his symptoms. I will arrange for outpatient follow up with interventional radiology to discuss options for treatment of endoleak.   CHIEF COMPLAINT: right chest / rib pain, difficulty breathing  HISTORY OF PRESENT ILLNESS: Ricky Harris is a 81 y.o. male with history of abdominal aortic aneurysm treated by stent graft in November 2020 by Dr. Scot Dock.  The patient presents to the [facility], ER for evaluation of right lateral anterior chest discomfort and difficulty breathing beginning this morning after waking.  He reports a history of chronic cough and rhinitis associated with allergies.  He denies any trauma to the chest wall.  He has no other abdominal tenderness.  He specifically denies any tenderness about the umbilicus.  He was previously followed by Dr. Scot Dock, but was lost to follow-up in 2021.  Since that time sequential CT scans have shown slow growth of his aneurysm sac.  CT scan performed today by the [facility], ER shows interval aneurysm sac growth to 7.4 cm.   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[facility])    Coronary artery disease    on CT scan   Gout    H/O hypercholesterolemia    Heart murmur    History of falling     recent- see ct chest results of 04/25/2018   Hypertension    Hypothyroidism    Liver cirrhosis ([place])    Prediabetes    pt denies    Renal artery stenosis (HCC)    Thrombocytopenia ([place])    Wears glasses    Wears glasses     Past Surgical History:  Procedure Laterality Date   ABDOMINAL AORTIC ENDOVASCULAR STENT GRAFT N/A 01/21/2019   Procedure: ABDOMINAL AORTIC ENDOVASCULAR STENT GRAFT WITH CO2;  Surgeon: Angelia Mould, MD;  Location: [place];  Service: Vascular;  Laterality: N/A;   ABDOMINAL AORTOGRAM W/LOWER EXTREMITY Bilateral 12/14/2018   Procedure: ABDOMINAL AORTOGRAM W/LOWER EXTREMITY;  Surgeon: Angelia Mould, MD;  Location: [place] [facility];  Service: Cardiovascular;  Laterality: Bilateral;   AV FISTULA PLACEMENT Left 03/12/2019   Procedure: LEFT ARTERIOVENOUS Arteriovenous FISTULA CREATION.;  Surgeon: Angelia Mould, MD;  Location: [facility] OR;  Service: Vascular;  Laterality: Left;   CATARACT EXTRACTION W/ INTRAOCULAR LENS  IMPLANT, BILATERAL     CHOLECYSTECTOMY     ESOPHAGEAL BANDING N/A 08/11/2017   Procedure: ESOPHAGEAL BANDING;  Surgeon: Otis Brace, MD;  Location: [place];  Service: Gastroenterology;  Laterality: N/A;   ESOPHAGEAL BANDING N/A 10/26/2017   Procedure: ESOPHAGEAL BANDING;  Surgeon: Otis Brace, MD;  Location: [facility];  Service: Gastroenterology;  Laterality: N/A;   ESOPHAGEAL BANDING N/A 12/19/2017   Procedure: ESOPHAGEAL BANDING;  Surgeon: Otis Brace, MD;  Location: [facility];  Service: Gastroenterology;  Laterality: N/A;   ESOPHAGOGASTRODUODENOSCOPY (EGD) WITH PROPOFOL N/A 07/05/2017   Procedure: ESOPHAGOGASTRODUODENOSCOPY (EGD) WITH PROPOFOL;  Surgeon: Otis Brace, MD;  Location: [place];  Service: Gastroenterology;  Laterality: N/A;   ESOPHAGOGASTRODUODENOSCOPY (EGD) WITH PROPOFOL N/A 08/11/2017   Procedure: ESOPHAGOGASTRODUODENOSCOPY (EGD) WITH PROPOFOL;  Surgeon: Otis Brace, MD;  Location:  [place];  Service: Gastroenterology;  Laterality: N/A;   ESOPHAGOGASTRODUODENOSCOPY (EGD) WITH PROPOFOL N/A 10/26/2017   Procedure: ESOPHAGOGASTRODUODENOSCOPY (EGD) WITH PROPOFOL;  Surgeon: Otis Brace, MD;  [facility];  Service: Gastroenterology;  Laterality: N/A;   ESOPHAGOGASTRODUODENOSCOPY (EGD) WITH PROPOFOL N/A 12/19/2017   Procedure: ESOPHAGOGASTRODUODENOSCOPY (EGD) WITH PROPOFOL;  Surgeon: Otis Brace, MD;  [facility];  Service: Gastroenterology;  Laterality: N/A;   ESOPHAGOGASTRODUODENOSCOPY (EGD) WITH PROPOFOL N/A 10/29/2018   Procedure: ESOPHAGOGASTRODUODENOSCOPY (EGD) WITH PROPOFOL;  Surgeon: Otis Brace, MD;  [facility];  Service: Gastroenterology;  Laterality: N/A;   ESOPHAGOGASTRODUODENOSCOPY (EGD) WITH PROPOFOL N/A 02/14/2020   Procedure: ESOPHAGOGASTRODUODENOSCOPY (EGD) WITH PROPOFOL;  Surgeon: Otis Brace, MD;  Location: [facility];  Service: Gastroenterology;  Laterality: N/A;   EYE SURGERY     bilateral cataract removal with lens placement   HERNIA REPAIR     IR PARACENTESIS  12/26/2018   IR PARACENTESIS  01/02/2019   IR PARACENTESIS  01/15/2019   IR PARACENTESIS  01/30/2019   IR PARACENTESIS  02/05/2019   IR PARACENTESIS  02/14/2019   IR PARACENTESIS  02/28/2019   IR PARACENTESIS  03/14/2019   IR PARACENTESIS  03/29/2019   IR PARACENTESIS  04/11/2019   IR PARACENTESIS  04/25/2019   IR PARACENTESIS  05/09/2019   IR PARACENTESIS  06/03/2019   IR PARACENTESIS  06/06/2019   IR PARACENTESIS  06/20/2019   IR PARACENTESIS  07/03/2019   IR PARACENTESIS  07/19/2019   IR PARACENTESIS  08/08/2019   IR PARACENTESIS  08/27/2019   IR PARACENTESIS  09/09/2019   IR PARACENTESIS  09/24/2019   IR PARACENTESIS  10/07/2019   IR PARACENTESIS  10/23/2019   IR PARACENTESIS  11/07/2019   IR PARACENTESIS  11/21/2019   IR PARACENTESIS  12/04/2019   IR PARACENTESIS  12/25/2019   IR PARACENTESIS  01/10/2020   IR PARACENTESIS  01/27/2020   IR  PARACENTESIS  02/19/2020   IR PARACENTESIS  03/27/2020   PROSTATECTOMY     RIGHT AND LEFT HEART CATH N/A 09/18/2020   Procedure: RIGHT AND LEFT HEART CATH;  Surgeon: Troy Sine, MD;  Location: [place] [facility];  Service: Cardiovascular;  Laterality: N/A;   subtotal gastrectomy      Family History  Problem Relation Age of Onset   Diabetes Mellitus II Mother    Stroke Mother     Social History   Socioeconomic History   Marital status: Divorced    Spouse name: Not on file   Number of children: Not on file   Years of education: Not on file   Highest education level: Not on file  Occupational History   Not on file  Tobacco Use   Smoking status: Never   Smokeless tobacco: Never  Vaping Use   Vaping Use: Never used  Substance and Sexual Activity   Alcohol use: Not Currently   Drug use: Never   Sexual activity: Not on file  Other Topics Concern   Not on file  Social History Narrative   Not  on file   Social Determinants of Health   Financial Resource Strain: Not on file  Food Insecurity: Not on file  Transportation Needs: Not on file  Physical Activity: Not on file  Stress: Not on file  Social Connections: Not on file  Intimate Partner Violence: Not on file    Allergies  Allergen Reactions   Cephalexin Hives   Hydralazine Hives    No current facility-administered medications for this encounter.   Current Outpatient Medications  Medication Sig Dispense Refill   apixaban (ELIQUIS) 5 MG TABS tablet TAKE 1 TABLET(5 MG) BY MOUTH TWICE DAILY 60 tablet 5   atorvastatin (LIPITOR) 40 MG tablet Take 1 tablet (40 mg total) by mouth daily. 30 tablet 1   calcium acetate (PHOSLO) 667 MG capsule Take 1 capsule (667 mg total) by mouth 3 (three) times daily with meals. 90 capsule 2   cetirizine (ZYRTEC) 10 MG tablet Take 10 mg by mouth daily as needed for allergies.     clopidogrel (PLAVIX) 75 MG tablet Take 1 tablet (75 mg total) by mouth daily. 30 tablet 1   isosorbide  mononitrate (IMDUR) 30 MG 24 hr tablet Take 1/2 tablet (15 mg total) by mouth daily. 30 tablet 1   levothyroxine (SYNTHROID) 50 MCG tablet Take 1 tablet (50 mcg total) by mouth daily. 30 tablet 1   metoprolol succinate (TOPROL-XL) 25 MG 24 hr tablet Take 1 and 1/2 tablets (37.5 mg total) by mouth daily. 45 tablet 1   multivitamin (RENA-VIT) TABS tablet Take 1 tablet by mouth daily. 30 tablet 0   spironolactone (ALDACTONE) 25 MG tablet Take 1/2 tablet (12.5 mg total) by mouth daily. 30 tablet 0   traMADol (ULTRAM) 50 MG tablet Take 1 tablet (50 mg total) by mouth every 8 (eight) hours as needed for moderate pain. 8 tablet 0    PHYSICAL EXAM Vitals:   11/04/21 1123 11/04/21 1330 11/04/21 1407 11/04/21 1410  BP:  (!) 160/119 (!) 125/55   Pulse:  66  65  Resp:  18 15 17   Temp:      TempSrc:      SpO2:  100%  100%  Weight: 76.7 kg     Height: 6' (1.829 m)      Chronically ill gentleman Regular rate and rhythm Unlabored breathing Right anterior / chest wall tenderness Right upper quadrant tenderness No tenderness in aneurysm sac 2+ femoral pulses  PERTINENT LABORATORY AND RADIOLOGIC DATA  Most recent CBC    Latest Ref Rng & Units 11/04/2021   11:50 AM 10/23/2021   10:55 PM 09/07/2021    9:23 PM  CBC  WBC 4.0 - 10.5 K/uL 10.5  11.8  10.3   Hemoglobin 13.0 - 17.0 g/dL 10.7  12.1  11.3   Hematocrit 39.0 - 52.0 % 33.0  36.3  34.7   Platelets 150 - 400 K/uL 124  193  134      Most recent CMP    Latest Ref Rng & Units 11/04/2021   11:50 AM 10/23/2021   10:55 PM 09/07/2021    9:23 PM  CMP  Glucose 70 - 99 mg/dL 111  111  92   BUN 8 - 23 mg/dL 69  33  41   Creatinine 0.61 - 1.24 mg/dL 8.24  4.79  5.42   Sodium 135 - 145 mmol/L 136  135  138   Potassium 3.5 - 5.1 mmol/L 4.7  4.4  4.0   Chloride 98 - 111 mmol/L 99  95  99   CO2 22 - 32 mmol/L 25  26  25    Calcium 8.9 - 10.3 mg/dL 9.4  9.4  8.5   Total Protein 6.5 - 8.1 g/dL 6.6  7.6    Total Bilirubin 0.3 - 1.2 mg/dL 0.6  0.9     Alkaline Phos 38 - 126 U/L 232  298    AST 15 - 41 U/L 42  43    ALT 0 - 44 U/L 44  68      Renal function Estimated Creatinine Clearance: 7.9 mL/min (A) (by C-G formula based on SCr of 8.24 mg/dL (H)).  Hgb A1c MFr Bld (%)  Date Value  04/25/2021 4.5 (L)    LDL Cholesterol  Date Value Ref Range Status  09/19/2020 74 0 - 99 mg/dL Final    Comment:           Total Cholesterol/HDL:CHD Risk Coronary Heart Disease Risk Table                     Men   Women  1/2 Average Risk   3.4   3.3  Average Risk       5.0   4.4  2 X Average Risk   9.6   7.1  3 X Average Risk  23.4   11.0        Use the calculated Patient Ratio above and the CHD Risk Table to determine the patient's CHD Risk.        ATP III CLASSIFICATION (LDL):  <100     mg/dL   Optimal  100-129  mg/dL   Near or Above                    Optimal  130-159  mg/dL   Borderline  160-189  mg/dL   High  >190     mg/dL   Very High Performed at [facility] 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  I do not see any clear evidence of a type IA, 1B, or 3 endoleak.  The aneurysm sac has grown to 7.4 cm.  Yevonne Aline. Stanford Breed, MD [facility] Phone Number: [phone] 11/04/2021 2:56 PM  Total time spent on preparing this encounter including chart review, data review, collecting history, examining the patient, coordinating care for this established patient, 40 minutes.  Portions of this report may have been transcribed using voice recognition software.  Every effort has been made to ensure accuracy; however, inadvertent computerized transcription errors may still be present.

## 2021-11-04 NOTE — ED Notes (Signed)
Patient transported to CT 

## 2021-11-04 NOTE — ED Provider Notes (Signed)
[facility]PART[facility] Provider Note   CSN: [id number] Arrival date & time: 11/04/21  1055     History  Chief Complaint  Patient presents with   Shortness of Breath    Ricky Harris is a 81 y.o. male.  Patient is a 81 year old male with a past medical history of AAA status post repair, ESRD on TTS HD, cirrhosis, CAD, A-fib on Eliquis presenting to the emergency department with abdominal pain.  The patient states that he woke up this morning and started to develop right-sided abdominal pain radiating from his right lower quadrant to his right upper quadrant.  He states that his pain is worse with taking deep breaths but denies associated shortness of breath.  He denies any chest pain.  He denies any fevers or chills, nausea, vomiting or diarrhea.  He states he does not make urine.  He states he was due for dialysis today but otherwise has been going to dialysis as scheduled.  He states that he has had a previous cholecystectomy.  He states that he has had paracentesis performed several years ago but does not require these regularly.  The history is provided by the patient and a relative.  Shortness of Breath      Home Medications Prior to Admission medications   Medication Sig Start Date End Date Taking? Authorizing Provider  apixaban (ELIQUIS) 5 MG TABS tablet TAKE 1 TABLET(5 MG) BY MOUTH TWICE DAILY 08/17/21   Martinique, Peter M, MD  atorvastatin (LIPITOR) 40 MG tablet Take 1 tablet (40 mg total) by mouth daily. 04/28/21   Dessa Phi, DO  calcium acetate (PHOSLO) 667 MG capsule Take 1 capsule (667 mg total) by mouth 3 (three) times daily with meals. 02/20/20   Rai, Vernelle Emerald, MD  cetirizine (ZYRTEC) 10 MG tablet Take 10 mg by mouth daily as needed for allergies.    [provider]  clopidogrel (PLAVIX) 75 MG tablet Take 1 tablet (75 mg total) by mouth daily. 04/28/21   Dessa Phi, DO  isosorbide mononitrate (IMDUR) 30 MG 24 hr tablet Take 1/2  tablet (15 mg total) by mouth daily. 04/27/21   Dessa Phi, DO  levothyroxine (SYNTHROID) 50 MCG tablet Take 1 tablet (50 mcg total) by mouth daily. 04/27/21   Dessa Phi, DO  metoprolol succinate (TOPROL-XL) 25 MG 24 hr tablet Take 1 and 1/2 tablets (37.5 mg total) by mouth daily. 04/27/21 06/26/21  Dessa Phi, DO  multivitamin (RENA-VIT) TABS tablet Take 1 tablet by mouth daily. 09/27/19   Autry-Lott, Naaman Plummer, DO  spironolactone (ALDACTONE) 25 MG tablet Take 1/2 tablet (12.5 mg total) by mouth daily. 04/27/21   Dessa Phi, DO  traMADol (ULTRAM) 50 MG tablet Take 1 tablet (50 mg total) by mouth every 8 (eight) hours as needed for moderate pain. 03/12/19   Rhyne, Hulen Shouts, PA-C      Allergies    Cephalexin and Hydralazine    Review of Systems   Review of Systems  Respiratory:  Positive for shortness of breath.     Physical Exam Updated Vital Signs BP (!) 126/51   Pulse (!) 56   Temp 98.7 F (37.1 C)   Resp 14   Ht 6' (1.829 m)   Wt 76.7 kg   SpO2 98%   BMI 22.92 kg/m  Physical Exam Vitals and nursing note reviewed.  Constitutional:      General: He is not in acute distress.    Appearance: He is well-developed.  HENT:  Head: Normocephalic and atraumatic.     Mouth/Throat:     Mouth: Mucous membranes are moist.     Pharynx: Oropharynx is clear.  Eyes:     Extraocular Movements: Extraocular movements intact.  Cardiovascular:     Rate and Rhythm: Normal rate and regular rhythm.     Pulses: Normal pulses.  Pulmonary:     Effort: Pulmonary effort is normal.     Breath sounds: Normal breath sounds.  Chest:     Chest wall: No tenderness.  Abdominal:     Palpations: Abdomen is soft.     Comments: Tenderness diffusely to palpation, worse in the RUQ, no rebound or guarding No palpable fluid wave, no distension   Musculoskeletal:        General: Normal range of motion.     Cervical back: Normal range of motion and neck supple.     Right lower leg: No edema.      Left lower leg: No edema.  Skin:    General: Skin is warm and dry.  Neurological:     General: No focal deficit present.     Mental Status: He is alert and oriented to person, place, and time.  Psychiatric:        Mood and Affect: Mood normal.        Behavior: Behavior normal.     ED Results / Procedures / Treatments   Labs (all labs ordered are listed, but only abnormal results are displayed) Labs Reviewed  CBC WITH DIFFERENTIAL/PLATELET - Abnormal; Notable for the following components:      Result Value   RBC 3.08 (*)    Hemoglobin 10.7 (*)    HCT 33.0 (*)    MCV 107.1 (*)    MCH 34.7 (*)    RDW 16.0 (*)    Platelets 124 (*)    Neutro Abs 8.3 (*)    Monocytes Absolute 1.1 (*)    Abs Immature Granulocytes 0.12 (*)    All other components within normal limits  COMPREHENSIVE METABOLIC PANEL - Abnormal; Notable for the following components:   Glucose, Bld 111 (*)    BUN 69 (*)    Creatinine, Ser 8.24 (*)    Albumin 3.3 (*)    AST 42 (*)    Alkaline Phosphatase 232 (*)    GFR, Estimated 6 (*)    All other components within normal limits  PROTIME-INR - Abnormal; Notable for the following components:   Prothrombin Time 16.3 (*)    INR 1.3 (*)    All other components within normal limits  TROPONIN I (HIGH SENSITIVITY) - Abnormal; Notable for the following components:   Troponin I (High Sensitivity) 27 (*)    All other components within normal limits  TROPONIN I (HIGH SENSITIVITY) - Abnormal; Notable for the following components:   Troponin I (High Sensitivity) 26 (*)    All other components within normal limits  LIPASE, BLOOD  APTT  TYPE AND SCREEN    EKG EKG Interpretation  Date/Time:  Thursday November 04 2021 11:51:08 EDT Ventricular Rate:  65 PR Interval:  150 QRS Duration: 96 QT Interval:  398 QTC Calculation: 413 R Axis:   -28 Text Interpretation: Normal sinus rhythm Left ventricular hypertrophy with repolarization abnormality ( R in aVL ) Abnormal ECG  No significant change was found Confirmed by Oneal Deputy [phone]) on 11/04/2021 2:41:22 PM  Radiology CT ABDOMEN PELVIS W CONTRAST  Result Date: 11/04/2021 CLINICAL DATA:  Abdominal pain EXAM: CT ABDOMEN AND PELVIS  WITH CONTRAST TECHNIQUE: Multidetector CT imaging of the abdomen and pelvis was performed using the standard protocol following bolus administration of intravenous contrast. RADIATION DOSE REDUCTION: This exam was performed according to the departmental dose-optimization program which includes automated exposure control, adjustment of the mA and/or kV according to patient size and/or use of iterative reconstruction technique. CONTRAST:  178m OMNIPAQUE IOHEXOL 300 MG/ML  SOLN COMPARISON:  09/15/2020 FINDINGS: Lower chest: Coronary artery calcifications are seen. Left hemidiaphragm is elevated. There are linear patchy density seen in the posterior lower lung fields, more so on the right side. Hepatobiliary: There is fatty infiltration in liver. There is no dilation of bile ducts. Surgical clips are seen in gallbladder fossa. Pancreas: No focal abnormalities are seen. Spleen: Spleen measures 14 cm in maximum diameter. Adrenals/Urinary Tract: Adrenals are unremarkable. There is cortical thinning in both kidneys. A 2 mm calculus in the lower pole of right kidney. There is possible 1 mm calculus in the midportion of right kidney. There are a few smooth marginated low-density lesions in both kidneys largest measuring 2.3 cm in diameter. Density measurement in the largest of the lesions is less than 17 Hounsfield units suggesting renal cysts. Some of the subcentimeter low-density lesions could not be optimally characterized. Stomach/Bowel: Stomach is unremarkable. Small bowel loops are not dilated. Appendix is unremarkable. There is no wall thickening in colon. Diverticula are seen in colon without signs of focal acute diverticulitis. Vascular/Lymphatic: There is previous endovascular stent repair of  infrarenal aortic aneurysm. The native aneurysm measures 7.2 x 6.4 cm. The native aneurysmal sac measured 6.1 cm in size in the previous study. There is extravasation of contrast into the native aneurysmal sac suggesting endoleak. Extravasation appears to be arising from the area of attachment of left common iliac stent. There is no contrast extravasation outside the aneurysmal sac. There is no retroperitoneal hematoma. Reproductive: Prostate is not seen. There are fluid density structures in right inguinal canal and immediately behind the left rectus muscle in suprapubic region, devices used for management of erectile dysfunction. Other: There is no ascites or pneumoperitoneum. Small paraumbilical hernia containing fat is seen. Bilateral inguinal hernias containing fat are seen. Musculoskeletal: Degenerative changes are noted in lumbar spine, more so at the L3-L4 and L4-L5 levels. IMPRESSION: There is previous endovascular stent repair of infrarenal aortic aneurysm. There is extravasation of contrast into the native aneurysmal sac consistent with endoleak. Native aneurysmal sac measures up to 7.2 cm in maximum diameter which was 6.1 cm in the previous study done on 09/15/2020. there is no evidence of extravasation of contrast outside the aneurysmal sac. There is no retroperitoneal hematoma. There is no hemoperitoneum. There is no evidence of intestinal obstruction or pneumoperitoneum. There is no hydronephrosis. There is marked cortical thinning in the native kidneys, possibly suggesting medical renal disease. Small right renal calculus. Bilateral renal cysts. Fatty liver.  Enlarged spleen.  Diverticulosis of colon. Coronary artery disease. There are small patchy densities in both lower lung fields suggesting scarring or subsegmental atelectasis/pneumonia. Other findings as described in the body of the report. Imaging finding of endoleak in previously repaired aortic aneurysmal sac was relayed to patient's provider  Dr. KErnesto Rutherfordby telephone call. Electronically Signed   By: PElmer PickerM.D.   On: 11/04/2021 14:06   DG Chest 2 View  Result Date: 11/04/2021 CLINICAL DATA:  Shortness of breath, right chest pain EXAM: CHEST - 2 VIEW COMPARISON:  Previous studies including the examination of 10/23/2021 FINDINGS: Cardiac size is within normal limits.  There is elevation of left hemidiaphragm suggesting eventration or paralysis. There are linear densities in the lower lung fields. There is poor inspiration. There are no signs of pulmonary edema or focal consolidation. There is no significant pleural effusion or pneumothorax. IMPRESSION: There are linear densities in both lower lung fields suggesting subsegmental atelectasis. Part of this finding may be due to poor inspiration. There is elevation of left hemidiaphragm. Electronically Signed   By: Elmer Picker M.D.   On: 11/04/2021 12:16    Procedures Procedures    Medications Ordered in ED Medications  iohexol (OMNIPAQUE) 300 MG/ML solution 100 mL (100 mLs Intravenous Contrast Given 11/04/21 1339)  fentaNYL (SUBLIMAZE) injection 25 mcg (25 mcg Intravenous Given 11/04/21 1417)  iohexol (OMNIPAQUE) 350 MG/ML injection 100 mL (100 mLs Intravenous Contrast Given 11/04/21 1443)    ED Course/ Medical Decision Making/ A&P Clinical Course as of 11/04/21 1620  Thu Nov 04, 2021  1435 I spoke with Dr. Stanford Breed of vascular who evaluated the patient at bedside and recommended CT angio to further evaluate for possible endoleak. Patient remains stable at this time  [VK]  1607 Pending CTAm, fu with Stanford Breed [MK]  [phone] Patient signed out to Dr. Matilde Sprang in stable condition pending final recommendations by vascular and CTA read [VK]    Clinical Course User Index [MK] Kommor, Debe Coder, MD [VK] Ottie Glazier, DO                           Medical Decision Making This patient presents to the ED with chief complaint(s) of abdominal pain with pertinent past medical history  of ESRD, AAA, cirrhosis which further complicates the presenting complaint. The complaint involves an extensive differential diagnosis and also carries with it a high risk of complications and morbidity.    The differential diagnosis includes patient has no significant ascites making SBP unlikely, concerning for possible worsening cirrhosis, patient has had a cholecystectomy making cholelithiasis or cholecystitis unlikely, possible pancreatitis, possible endoleak, possible appendicitis, SBO less likely as the patient's having normal bowel movements and no nausea or vomiting  Additional history obtained: Additional history obtained from family Records reviewed previous vascular surgery records  ED Course and Reassessment: Patient was initially evaluated by provider in triage and had labs and CT performed.  Labs showed mildly worsening anemia, creatinine is at baseline.  CT showed concern for endoleak and vascular will be consulted.  Coags and type and screen will be added onto his work-up.  Patient was given fentanyl for pain and be reassessed.  Independent labs interpretation:  The following labs were independently interpreted: Mildly worsening anemia, creatinine at baseline  Independent visualization of imaging: - I independently visualized the following imaging with scope of interpretation limited to determining acute life threatening conditions related to emergency care: CT AP, which revealed concern for endoleak and increasing size of aneurysm  Consultation: - Consulted or discussed management/test interpretation w/ external professional: Vascular     Amount and/or Complexity of Data Reviewed Labs: ordered.  Risk Prescription drug management.           Final Clinical Impression(s) / ED Diagnoses Final diagnoses:  None    Rx / DC Orders ED Discharge Orders     None         Ottie Glazier, DO 11/04/21 1620

## 2021-11-04 NOTE — ED Provider Notes (Signed)
  Physical Exam  BP (!) 132/54   Pulse (!) 53   Temp 98.7 F (37.1 C)   Resp (!) 25   Ht 6' (1.829 m)   Wt 76.7 kg   SpO2 100%   BMI 22.92 kg/m   Physical Exam Constitutional:      General: He is not in acute distress.    Appearance: Normal appearance.  HENT:     Head: Normocephalic and atraumatic.     Nose: No congestion or rhinorrhea.  Eyes:     General:        Right eye: No discharge.        Left eye: No discharge.     Extraocular Movements: Extraocular movements intact.     Pupils: Pupils are equal, round, and reactive to light.  Cardiovascular:     Rate and Rhythm: Normal rate and regular rhythm.     Heart sounds: No murmur heard. Pulmonary:     Effort: No respiratory distress.     Breath sounds: No wheezing or rales.  Abdominal:     General: There is no distension.     Tenderness: There is no abdominal tenderness.  Musculoskeletal:        General: Normal range of motion.     Cervical back: Normal range of motion.     Comments: Chest wall tenderness  Skin:    General: Skin is warm and dry.  Neurological:     General: No focal deficit present.     Mental Status: He is alert.     Procedures  Procedures  ED Course / MDM   Clinical Course as of 11/04/21 2116  Thu Nov 04, 2021  1435 I spoke with Dr. Stanford Breed of vascular who evaluated the patient at bedside and recommended CT angio to further evaluate for possible endoleak. Patient remains stable at this time  [VK]  1607 Pending CTAm, fu with Stanford Breed [MK]  [phone] Patient signed out to Dr. Matilde Sprang in stable condition pending final recommendations by vascular and CTA read [VK]    Clinical Course User Index [MK] Ashley Montminy, Debe Coder, MD [VK] Ernesto Rutherford Norman Herrlich, DO   Medical Decision Making Amount and/or Complexity of Data Reviewed Labs: ordered.  Risk Prescription drug management.   Patient seen emergency room for evaluation of chest and abdominal pain.  Work-up incidentally finding but is suspicious for an  endoleak.  Patient received in handoff with CT angiography pending.  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 Oran Rein in who states that the patient will be safe to follow-up in the outpatient setting with likely IR intervention.  I double checked with the interventional radiologist on-call who agrees with this plan.  A lidocaine patch was placed over the ribs with the patient has the worst amount of pain and his pain significantly improved.  Suspect his CT findings are likely incidental today and not related to what appears to be a costochondritis.  We will avoid NSAIDs in the setting of his endoleak but patient was then discharged on lidocaine patches after improvement here in the ER.       Teressa Lower, MD 11/04/21 2117

## 2021-11-04 NOTE — ED Provider Triage Note (Signed)
Emergency Medicine Provider Triage Evaluation Note  HENDRICKS SCHWANDT , a 81 y.o. male  was evaluated in triage.  Pt complains of shortness of breath and abdominal pain. He states that same began this morning when he woke up. States that pain is located throughout his abdomen as well as his RUQ. States that his last dialysis was Tuesday, he was supposed to go today but came here instead. Denies any cough or congestion. Denies any abdominal swelling or leg swelling. No leg pain. He is anticoagulated with Elliquis and denies any missed doses. Denies any chest pain  Review of Systems  Positive:  Negative:   Physical Exam  BP 114/63 (BP Location: Right Arm)   Pulse 66   Temp 98.9 F (37.2 C) (Oral)   Resp 18   Ht 6' (1.829 m)   Wt 76.7 kg   SpO2 100%   BMI 22.92 kg/m  Gen:   Awake, no distress   Resp:  Normal effort  MSK:   Moves extremities without difficulty  Other:    Medical Decision Making  Medically screening exam initiated at 11:41 AM.  Appropriate orders placed.  [name] was informed that the remainder of the evaluation will be completed by another provider, this initial triage assessment does not replace that evaluation, and the importance of remaining in the ED until their evaluation is complete.     Bud Face, PA-C 11/04/21 1142

## 2021-11-04 NOTE — ED Triage Notes (Addendum)
Complains of right lower rib pain and sob and difficulty taking a deep breath.  Denies cough, fever, chest pain HX of AAA 5.3cm dialysis which missed today and liver disease with multiple IR paracentesis.

## 2021-11-05 ENCOUNTER — Telehealth: Payer: Self-pay

## 2021-11-05 ENCOUNTER — Other Ambulatory Visit: Payer: Self-pay | Admitting: Physician Assistant

## 2021-11-05 ENCOUNTER — Other Ambulatory Visit: Payer: Self-pay | Admitting: Vascular Surgery

## 2021-11-05 DIAGNOSIS — I714 Abdominal aortic aneurysm, without rupture, unspecified: Secondary | ICD-10-CM

## 2021-11-05 NOTE — Telephone Encounter (Signed)
Pt's son called explaining the findings of a CT scan done in the ED yesterday and needed information on scheduling an appt.  Sent staff msg to Dr Stanford Breed who consulted on pt while in ED.

## 2021-11-05 NOTE — Telephone Encounter (Signed)
-----   Message from Nicholas Lose, RN sent at 11/05/2021  2:01 PM EDT ----- Regarding: RE: F/U I spoke with a PA- Wendy in IR and she said she will put in a IR eval/treat order and reach out to patient. This really should've been something completed while he was there in the ER.   Kea ----- Message ----- From: Gevena Mart, RN Sent: 11/05/2021   1:03 PM EDT To: Nicholas Lose, RN Subject: FW: F/U                                         ----- Message ----- From: Cherre Robins, MD Sent: 11/05/2021  12:34 PM EDT To: Gevena Mart, RN Subject: RE: F/U                                        He does not need office follow up until after IR evaluation and treatment ----- Message ----- From: Gevena Mart, RN Sent: 11/05/2021  12:05 PM EDT To: Cherre Robins, MD Subject: F/U                                            You consulted on this pt yesterday in the ED. One note reads for him to f/u with you in office and another note reads for him to be sent to IR. Does he need an office visit? Pt's son called, so I wanted to clarify. Thanks, Engineer, materials - Triage

## 2021-11-16 ENCOUNTER — Ambulatory Visit
Admission: RE | Admit: 2021-11-16 | Discharge: 2021-11-16 | Disposition: A | Discharge status: Home / Self Care | Payer: Medicare Other | Source: Ambulatory Visit | Attending: Physician Assistant | Admitting: Physician Assistant

## 2021-11-16 DIAGNOSIS — I714 Abdominal aortic aneurysm, without rupture, unspecified: Secondary | ICD-10-CM

## 2021-11-16 HISTORY — PX: IR RADIOLOGIST EVAL & MGMT: IMG5224

## 2021-11-16 NOTE — Consult Note (Signed)
Chief Complaint: Type 2 endoleak  Referring Physician(s): Dr. Jamelle Haring  PCP: Dr. Bea Graff, Tia Alert  History of Present Illness: Ricky Harris is a 81 y.o. male presenting as a scheduled consultation to [place] [facility], kindly referred by Dr. Stanford Breed, for evaluation of endoleak and candidacy for embolization.    Unfortunately he was unable to join [place] in the clinic today, and the consult was performed virtually.  His daughter-in-law, Ricky Harris, was present on the phone call. We confirmed his identity with 2 personal identifiers.   Ricky Harris presented to the ED at [facility] 11/04/21 with complaint of right chest/upper abdominal pain.  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  He was discharged home with follow up . He states he was given lidocaine patches, which have had some effect but has not completely resolved his lower chest/upper abdominal pain. He tells me this has been going on for at least several months.  He also takes tramadol for some relief.    He has history of MI, and Ricky Harris tells me that he was not a candidate for revascularization.  He denies any resting chest pain or chest pain with activity.  When I asked about 2 flights of stairs, they tell me that he does not really climb stairs, and is using a walker for ambulation.   He has no history of stroke.   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  Main body from the right (surgical exposure), 31m x 146mx 12cm.  Left side 1649m 13.5cm, and right extension was 60m8m9.5cm. CIA landing zone bilaterally.    Baseline diameter of the excluded aneurysm was ~5.5cm on CT 03/22/19.  The current diameter is estimated 7.1cm on CT 11/04/21.  Seems to be an IMA source.     Past Medical History:  Diagnosis Date   A-fib (HCC)[place]22/2022   AAA (abdominal aortic aneurysm) (HCC)[place] 5.3cm , magd by vascular Dr DickScot Dock  Anemia    " slightly"   Aortic stenosis    Arthritis    knees   Ascites    Cancer (HCC)[place] prostate   Chronic kidney disease    stage 4, mgd by Dr WebbJustin Mend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[facility] Coronary artery disease    on CT scan   Gout    H/O hypercholesterolemia    Heart murmur    History of falling    recent- see ct chest results of 04/25/2018   Hypertension    Hypothyroidism    Liver cirrhosis (HCC)[place] Prediabetes    pt denies    Renal artery stenosis (HCC)    Thrombocytopenia (HCC)[place] Wears glasses    Wears glasses     Past Surgical History:  Procedure Laterality Date   ABDOMINAL AORTIC ENDOVASCULAR STENT GRAFT N/A 01/21/2019   Procedure: ABDOMINAL AORTIC ENDOVASCULAR STENT GRAFT WITH CO2;  Surgeon: DickAngelia Mould;  [facility];  Laterality: N/A;   ABDOMINAL AORTOGRAM W/LOWER EXTREMITY Bilateral 12/14/2018   Procedure: ABDOMINAL AORTOGRAM W/LOWER EXTREMITY;  Surgeon: DickAngelia Mould;  [facility];  Service: Cardiovascular;  Laterality: Bilateral;   AV FISTULA PLACEMENT Left 03/12/2019   Procedure: LEFT ARTERIOVENOUS Arteriovenous FISTULA CREATION.;  Surgeon:  Angelia Mould, MD;  Location: [facility] OR;  Service: Vascular;  Laterality: Left;   CATARACT EXTRACTION W/ INTRAOCULAR LENS  IMPLANT, BILATERAL     CHOLECYSTECTOMY     ESOPHAGEAL BANDING N/A 08/11/2017   Procedure: ESOPHAGEAL BANDING;  Surgeon: Otis Brace, MD;  Location: [place];  Service: Gastroenterology;  Laterality: N/A;   ESOPHAGEAL BANDING N/A 10/26/2017   Procedure: ESOPHAGEAL BANDING;  Surgeon: Otis Brace, MD;  [facility];  Service: Gastroenterology;  Laterality: N/A;   ESOPHAGEAL BANDING N/A 12/19/2017   Procedure: ESOPHAGEAL BANDING;  Surgeon: Otis Brace, MD;  [facility];  Service: Gastroenterology;  Laterality: N/A;   ESOPHAGOGASTRODUODENOSCOPY (EGD) WITH PROPOFOL N/A 07/05/2017    Procedure: ESOPHAGOGASTRODUODENOSCOPY (EGD) WITH PROPOFOL;  Surgeon: Otis Brace, MD;  Location: [facility];  Service: Gastroenterology;  Laterality: N/A;   ESOPHAGOGASTRODUODENOSCOPY (EGD) WITH PROPOFOL N/A 08/11/2017   Procedure: ESOPHAGOGASTRODUODENOSCOPY (EGD) WITH PROPOFOL;  Surgeon: Otis Brace, MD;  Location: [place];  Service: Gastroenterology;  Laterality: N/A;   ESOPHAGOGASTRODUODENOSCOPY (EGD) WITH PROPOFOL N/A 10/26/2017   Procedure: ESOPHAGOGASTRODUODENOSCOPY (EGD) WITH PROPOFOL;  Surgeon: Otis Brace, MD;  [facility];  Service: Gastroenterology;  Laterality: N/A;   ESOPHAGOGASTRODUODENOSCOPY (EGD) WITH PROPOFOL N/A 12/19/2017   Procedure: ESOPHAGOGASTRODUODENOSCOPY (EGD) WITH PROPOFOL;  Surgeon: Otis Brace, MD;  [facility];  Service: Gastroenterology;  Laterality: N/A;   ESOPHAGOGASTRODUODENOSCOPY (EGD) WITH PROPOFOL N/A 10/29/2018   Procedure: ESOPHAGOGASTRODUODENOSCOPY (EGD) WITH PROPOFOL;  Surgeon: Otis Brace, MD;  [facility];  Service: Gastroenterology;  Laterality: N/A;   ESOPHAGOGASTRODUODENOSCOPY (EGD) WITH PROPOFOL N/A 02/14/2020   Procedure: ESOPHAGOGASTRODUODENOSCOPY (EGD) WITH PROPOFOL;  Surgeon: Otis Brace, MD;  [facility];  Service: Gastroenterology;  Laterality: N/A;   EYE SURGERY     bilateral cataract removal with lens placement   HERNIA REPAIR     IR PARACENTESIS  12/26/2018   IR PARACENTESIS  01/02/2019   IR PARACENTESIS  01/15/2019   IR PARACENTESIS  01/30/2019   IR PARACENTESIS  02/05/2019   IR PARACENTESIS  02/14/2019   IR PARACENTESIS  02/28/2019   IR PARACENTESIS  03/14/2019   IR PARACENTESIS  03/29/2019   IR PARACENTESIS  04/11/2019   IR PARACENTESIS  04/25/2019   IR PARACENTESIS  05/09/2019   IR PARACENTESIS  06/03/2019   IR PARACENTESIS  06/06/2019   IR PARACENTESIS  06/20/2019   IR PARACENTESIS  07/03/2019   IR PARACENTESIS  07/19/2019   IR PARACENTESIS  08/08/2019    IR PARACENTESIS  08/27/2019   IR PARACENTESIS  09/09/2019   IR PARACENTESIS  09/24/2019   IR PARACENTESIS  10/07/2019   IR PARACENTESIS  10/23/2019   IR PARACENTESIS  11/07/2019   IR PARACENTESIS  11/21/2019   IR PARACENTESIS  12/04/2019   IR PARACENTESIS  12/25/2019   IR PARACENTESIS  01/10/2020   IR PARACENTESIS  01/27/2020   IR PARACENTESIS  02/19/2020   IR PARACENTESIS  03/27/2020   PROSTATECTOMY     RIGHT AND LEFT HEART CATH N/A 09/18/2020   Procedure: RIGHT AND LEFT HEART CATH;  Surgeon: Troy Sine, MD;  Location: [place] [facility];  Service: Cardiovascular;  Laterality: N/A;   subtotal gastrectomy      Allergies: Cephalexin and Hydralazine  Medications: Prior to Admission medications   Medication Sig Start Date End Date Taking? Authorizing Provider  apixaban (ELIQUIS) 5 MG TABS tablet TAKE 1 TABLET(5 MG) BY MOUTH TWICE DAILY 08/17/21   Martinique, Peter M, MD  atorvastatin (LIPITOR) 40 MG tablet Take 1 tablet (40 mg  total) by mouth daily. 04/28/21   Dessa Phi, DO  calcium acetate (PHOSLO) 667 MG capsule Take 1 capsule (667 mg total) by mouth 3 (three) times daily with meals. 02/20/20   Rai, Vernelle Emerald, MD  cetirizine (ZYRTEC) 10 MG tablet Take 10 mg by mouth daily as needed for allergies.    [provider]  clopidogrel (PLAVIX) 75 MG tablet Take 1 tablet (75 mg total) by mouth daily. 04/28/21   Dessa Phi, DO  isosorbide mononitrate (IMDUR) 30 MG 24 hr tablet Take 1/2 tablet (15 mg total) by mouth daily. 04/27/21   Dessa Phi, DO  levothyroxine (SYNTHROID) 50 MCG tablet Take 1 tablet (50 mcg total) by mouth daily. 04/27/21   Dessa Phi, DO  lidocaine (LIDODERM) 5 % Place 1 patch onto the skin daily. Remove & Discard patch within 12 hours or as directed by MD 11/04/21   Kommor, Debe Coder, MD  metoprolol succinate (TOPROL-XL) 25 MG 24 hr tablet Take 1 and 1/2 tablets (37.5 mg total) by mouth daily. 04/27/21 06/26/21  Dessa Phi, DO  multivitamin (RENA-VIT) TABS tablet  Take 1 tablet by mouth daily. 09/27/19   Autry-Lott, Naaman Plummer, DO  spironolactone (ALDACTONE) 25 MG tablet Take 1/2 tablet (12.5 mg total) by mouth daily. 04/27/21   Dessa Phi, DO  traMADol (ULTRAM) 50 MG tablet Take 1 tablet (50 mg total) by mouth every 8 (eight) hours as needed for moderate pain. 03/12/19   Rhyne, Hulen Shouts, PA-C     Family History  Problem Relation Age of Onset   Diabetes Mellitus II Mother    Stroke Mother     Social History   Socioeconomic History   Marital status: Divorced    Spouse name: Not on file   Number of children: Not on file   Years of education: Not on file   Highest education level: Not on file  Occupational History   Not on file  Tobacco Use   Smoking status: Never   Smokeless tobacco: Never  Vaping Use   Vaping Use: Never used  Substance and Sexual Activity   Alcohol use: Not Currently   Drug use: Never   Sexual activity: Not on file  Other Topics Concern   Not on file  Social History Narrative   Not on file   Social Determinants of Health   Financial Resource Strain: Not on file  Food Insecurity: Not on file  Transportation Needs: Not on file  Physical Activity: Not on file  Stress: Not on file  Social Connections: Not on file     Review of Systems  Review of Systems: A 12 point ROS discussed and pertinent positives are indicated in the HPI above.  All other systems are negative.  Advance Care Plan: The advanced care plan/surrogate decision maker was discussed at the time of visit and documented in the medical record.    Physical Exam No direct physical exam was performed (except for noted visual exam findings with Video Visits).    Vital Signs: There were no vitals taken for this visit.  Imaging: CT Angio Abd/Pel w/ and/or w/o  Result Date: 11/04/2021 CLINICAL DATA:  Abdominal aortic aneurysm status post endograft repair Evaluate for endoleak EXAM: CTA ABDOMEN AND PELVIS WITHOUT AND WITH CONTRAST TECHNIQUE:  Multidetector CT imaging of the abdomen and pelvis was performed using the standard protocol during bolus administration of intravenous contrast. Multiplanar reconstructed images and MIPs were obtained and reviewed to evaluate the vascular anatomy. RADIATION DOSE REDUCTION: This exam was performed according  to the departmental dose-optimization program which includes automated exposure control, adjustment of the mA and/or kV according to patient size and/or use of iterative reconstruction technique. CONTRAST:  142m OMNIPAQUE IOHEXOL 350 MG/ML SOLN COMPARISON:  CT abdomen pelvis with contrast 11/04/2021 CT angiography chest abdomen pelvis 08/16/2020 CT abdomen pelvis 02/08/2020 FINDINGS: VASCULAR Aorta: Abdominal aortic aneurysm status post endograft repair is again seen. Maximum diameter of the aneurysm sac measures 7.1 x 6.4 cm compared to 5.7 x 5.8 cm on prior examination from 09/15/2020. There is accumulation of contrast within the excluded portion of the aneurysm on the delayed images, consistent with endoleak. Celiac: Patent without evidence of aneurysm, dissection, vasculitis or significant stenosis. SMA: Patent without evidence of aneurysm, dissection, vasculitis or significant stenosis. Renals: Severe stenosis at the origin of both main renal arteries. IMA: Occluded at the origin. Opacification of distal branches consistent with retrograde collateral flow. Inflow: No significant abnormality of the stented right common iliac artery. No significant stenosis of the right internal or external iliac arteries. Unchanged moderate to severe stenosis of the left common iliac artery stent just above the level of the bifurcation. Left common iliac, internal iliac, and external iliac arteries otherwise patent. Proximal Outflow: Bilateral common femoral and visualized portions of the superficial and profunda femoral arteries are patent without evidence of aneurysm, dissection, vasculitis or significant stenosis. Veins:  No obvious venous abnormality within the limitations of this arterial phase study. Review of the MIP images confirms the above findings. NON-VASCULAR Lower chest: Unchanged chronic elevation of left hemidiaphragm. Coronary calcifications are present. Heart is mildly enlarged. Hepatobiliary: No focal liver abnormality is seen. Status post cholecystectomy. No biliary dilatation. Pancreas: Unremarkable. No pancreatic ductal dilatation or surrounding inflammatory changes. Spleen: Normal in size without focal abnormality. Adrenals/Urinary Tract: Adrenal glands are normal. 2 mm nonobstructing calculus again seen in the lower pole of the right kidney. Atrophic kidneys are consistent with chronic renal failure. Multiple bilateral simple renal cysts do not require further imaging follow-up. 1.1 cm exophytic lesion at the lower pole the left kidney measures higher than simple fluid attenuation. This is favored to be a mildly complicated cysts given no significant change in size since 02/18/2020. Evaluation the bladder is limited due to collapse configuration. Stomach/Bowel: Normal appendix. No bowel dilatation to indicate ileus or obstruction. Descending and sigmoid colon diverticulosis without evidence of acute diverticulitis. Lymphatic: No enlarged abdominopelvic lymph nodes. Reproductive: Postsurgical changes of prostatectomy. Penile prosthesis reservoirs seen in the right inguinal region and left pelvis. Soft tissue density posterior to the right inguinal canal reservoir measuring approximately 3 cm is unchanged dating back to 02/18/2020 which favors postsurgical change/granulation tissue. Other: Moderate size fat containing bilateral inguinal hernias. Musculoskeletal: No acute or significant osseous findings. IMPRESSION: VASCULAR 1. Abdominal aortic aneurysm status post endograft repair. Accumulation of contrast within the excluded sac on the delayed images is consistent with endoleak. 2. Aneurysm sac has increased in  size since prior examination now measuring 7.1 x 6.4 cm compared to 5.7 x 5.8 cm on 09/15/2020. 3. 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 NON-VASCULAR 1. Colonic diverticulosis. 2. Status post prostatectomy. 3. Atrophic kidneys. 4. 2 mm nonobstructing right renal calculus. Electronically Signed   By: FMiachel RouxM.D.   On: 11/04/2021 16:23   CT ABDOMEN PELVIS W CONTRAST  Result Date: 11/04/2021 CLINICAL DATA:  Abdominal pain EXAM: CT ABDOMEN AND PELVIS WITH CONTRAST TECHNIQUE: Multidetector CT imaging of the abdomen and pelvis was performed using the standard protocol  following bolus administration of intravenous contrast. RADIATION DOSE REDUCTION: This exam was performed according to the departmental dose-optimization program which includes automated exposure control, adjustment of the mA and/or kV according to patient size and/or use of iterative reconstruction technique. CONTRAST:  171m OMNIPAQUE IOHEXOL 300 MG/ML  SOLN COMPARISON:  09/15/2020 FINDINGS: Lower chest: Coronary artery calcifications are seen. Left hemidiaphragm is elevated. There are linear patchy density seen in the posterior lower lung fields, more so on the right side. Hepatobiliary: There is fatty infiltration in liver. There is no dilation of bile ducts. Surgical clips are seen in gallbladder fossa. Pancreas: No focal abnormalities are seen. Spleen: Spleen measures 14 cm in maximum diameter. Adrenals/Urinary Tract: Adrenals are unremarkable. There is cortical thinning in both kidneys. A 2 mm calculus in the lower pole of right kidney. There is possible 1 mm calculus in the midportion of right kidney. There are a few smooth marginated low-density lesions in both kidneys largest measuring 2.3 cm in diameter. Density measurement in the largest of the lesions is less than 17 Hounsfield units suggesting renal cysts. Some of the subcentimeter low-density lesions could not be optimally characterized.  Stomach/Bowel: Stomach is unremarkable. Small bowel loops are not dilated. Appendix is unremarkable. There is no wall thickening in colon. Diverticula are seen in colon without signs of focal acute diverticulitis. Vascular/Lymphatic: There is previous endovascular stent repair of infrarenal aortic aneurysm. The native aneurysm measures 7.2 x 6.4 cm. The native aneurysmal sac measured 6.1 cm in size in the previous study. There is extravasation of contrast into the native aneurysmal sac suggesting endoleak. Extravasation appears to be arising from the area of attachment of left common iliac stent. There is no contrast extravasation outside the aneurysmal sac. There is no retroperitoneal hematoma. Reproductive: Prostate is not seen. There are fluid density structures in right inguinal canal and immediately behind the left rectus muscle in suprapubic region, devices used for management of erectile dysfunction. Other: There is no ascites or pneumoperitoneum. Small paraumbilical hernia containing fat is seen. Bilateral inguinal hernias containing fat are seen. Musculoskeletal: Degenerative changes are noted in lumbar spine, more so at the L3-L4 and L4-L5 levels. IMPRESSION: There is previous endovascular stent repair of infrarenal aortic aneurysm. There is extravasation of contrast into the native aneurysmal sac consistent with endoleak. Native aneurysmal sac measures up to 7.2 cm in maximum diameter which was 6.1 cm in the previous study done on 09/15/2020. there is no evidence of extravasation of contrast outside the aneurysmal sac. There is no retroperitoneal hematoma. There is no hemoperitoneum. There is no evidence of intestinal obstruction or pneumoperitoneum. There is no hydronephrosis. There is marked cortical thinning in the native kidneys, possibly suggesting medical renal disease. Small right renal calculus. Bilateral renal cysts. Fatty liver.  Enlarged spleen.  Diverticulosis of colon. Coronary artery  disease. There are small patchy densities in both lower lung fields suggesting scarring or subsegmental atelectasis/pneumonia. Other findings as described in the body of the report. Imaging finding of endoleak in previously repaired aortic aneurysmal sac was relayed to patient's provider Dr. KErnesto Rutherfordby telephone call. Electronically Signed   By: PElmer PickerM.D.   On: 11/04/2021 14:06   DG Chest 2 View  Result Date: 11/04/2021 CLINICAL DATA:  Shortness of breath, right chest pain EXAM: CHEST - 2 VIEW COMPARISON:  Previous studies including the examination of 10/23/2021 FINDINGS: Cardiac size is within normal limits. There is elevation of left hemidiaphragm suggesting eventration or paralysis. There are linear densities in the lower  lung fields. There is poor inspiration. There are no signs of pulmonary edema or focal consolidation. There is no significant pleural effusion or pneumothorax. IMPRESSION: There are linear densities in both lower lung fields suggesting subsegmental atelectasis. Part of this finding may be due to poor inspiration. There is elevation of left hemidiaphragm. Electronically Signed   By: Elmer Picker M.D.   On: 11/04/2021 12:16   DG Chest Port 1 View  Result Date: 10/23/2021 CLINICAL DATA:  Questionable sepsis EXAM: PORTABLE CHEST 1 VIEW COMPARISON:  Chest x-ray 04/25/2021 FINDINGS: The heart size and mediastinal contours are within normal limits. Both lungs are clear. There is mild elevation of the left hemidiaphragm as seen on prior. The visualized skeletal structures are unremarkable. IMPRESSION: No active disease. Electronically Signed   By: Ronney Asters M.D.   On: 10/23/2021 23:05    Labs:  CBC: Recent Labs    07/13/21 1751 09/07/21 2123 10/23/21 2255 11/04/21 1150  WBC 7.0 10.3 11.8* 10.5  HGB 10.8* 11.3* 12.1* 10.7*  HCT 31.6* 34.7* 36.3* 33.0*  PLT 122* 134* 193 124*    COAGS: Recent Labs    04/25/21 1427 10/23/21 2255 11/04/21 1432  INR  --   1.3* 1.3*  APTT 33 31 33    BMP: Recent Labs    07/13/21 1751 09/07/21 2123 10/23/21 2255 11/04/21 1150  NA 137 138 135 136  K 3.3* 4.0 4.4 4.7  CL 101 99 95* 99  CO2 26 25 26 25   GLUCOSE 109* 92 111* 111*  BUN 46* 41* 33* 69*  CALCIUM 8.8* 8.5* 9.4 9.4  CREATININE 3.90* 5.42* 4.79* 8.24*  GFRNONAA 15* 10* 12* 6*    LIVER FUNCTION TESTS: Recent Labs    10/23/21 2255 11/04/21 1150  BILITOT 0.9 0.6  AST 43* 42*  ALT 68* 44  ALKPHOS 298* 232*  PROT 7.6 6.6  ALBUMIN 3.5 3.3*    TUMOR MARKERS: No results for input(s): "AFPTM", "CEA", "CA199", "CHROMGRNA" in the last 8760 hours.  Assessment and Plan:  Ricky Harris is a pleasant 81 yo male with history of treated AAA with EVAR, now with enlarging aneurysm sac secondary to Type II endoleak.     I had a lengthy discussion with Ricky Harris and his daughter-in-law, Ricky Harris, regarding EVAR and type II endoleak.  I first reviewed with him/discussed the decisions made when deciding to treat an aneurysm, specifically the surgical trade-off of open vs EVAR repair, with decreased up-front mortality/morbidity of EVAR yielding to a known increased rate of secondary intervention.  We also discussed the natural history of endoleaks and cited risk of rupture of a type II endoleak (~2% by [employer] data), active surveillance vs treatment, potential treatment options that would include trans-arterial, trans-venous, or direct sac access, and other risks of angiogram/embolization.  Additional risks include: bleeding, infection, arterial dissection, need for further surgery/procedure, hospitalization, contrast reaction, kidney injury, non-target embolization, cardiopulmonary collapse, death.     Regarding active surveillance, I did discuss with him my concern that there has been at least 1.5cm - 2cm of growth, now to size of >7 cm, in the setting of the ongoing endoleak.  My recommendation would be for trans-arterial treatment  I answered all of their  questions.  Plan: - They understand that our recommendation is to proceed with treatment of endoleak, with first strategy of trans-arterial embolization, targeting what appears to be IMA source from SMA mesenteric collateral. They are going to discuss as a family and get back to our office  with decision.  - If he would like to proceed, we will plan on GETA [facility] with Dr. Earleen Newport, for aortogram, possible embolization.  If anesthesia believes too high risk, we can discuss moderate sedation strategy.  - If he decides to use surveillance strategy, we will set up office visit in 6 months with noncontrast CT.  - Continue current medical care   Thank you for this interesting consult.  I greatly enjoyed meeting Ricky Harris and look forward to participating in their care.  A copy of this report was sent to the requesting provider on this date.  Electronically Signed: Corrie Mckusick 11/16/2021, 9:47 AM   I spent a total of  60 Minutes   in remote  clinical consultation, greater than 50% of which was counseling/coordinating care for type 2 endoleak, growing aneurysm sac, possible embolization.    Visit type: Audio only (telephone). Audio (no video) only due to patient's lack of internet/smartphone capability. Alternative for in-person consultation at [facility], [address] [place], [place], [place]. This visit type was conducted due to national recommendations for restrictions regarding the COVID-19 Pandemic (e.g. social distancing).  This format is felt to be most appropriate for this patient at this time.  All issues noted in this document were discussed and addressed.

## 2021-11-23 ENCOUNTER — Other Ambulatory Visit (HOSPITAL_COMMUNITY): Payer: Self-pay | Admitting: Interventional Radiology

## 2021-11-23 ENCOUNTER — Encounter (HOSPITAL_COMMUNITY): Payer: Self-pay

## 2021-11-23 DIAGNOSIS — I714 Abdominal aortic aneurysm, without rupture, unspecified: Secondary | ICD-10-CM

## 2021-12-01 ENCOUNTER — Encounter (HOSPITAL_COMMUNITY): Payer: Self-pay | Admitting: Interventional Radiology

## 2021-12-01 ENCOUNTER — Other Ambulatory Visit: Payer: Self-pay

## 2021-12-01 NOTE — Progress Notes (Signed)
S.D.W- Instructions   Your procedure is scheduled on Fri., Oct. 6, 2023 from 7:57AM-10:57AM.  Report to [facility] Main Entrance "A" at 5:30 A.M., then check in with the Admitting office.  Call this number if you have problems the morning of surgery:  [phone]   Remember:  Do not eat or drink after midnight on Oct. 5th    Take these medicines the morning of surgery with A SIP OF WATER: Levothyroxine (SYNTHROID)  If Needed: Cetirizine (ZYRTEC) Ondansetron (ZOFRAN) TraMADol (ULTRAM)  Continue taking Plavix. Take last dose of Eliquis on 10/4  As of today, STOP taking any Aspirin (unless otherwise instructed by your surgeon) Aleve, Naproxen, Ibuprofen, Motrin, Advil, Goody's, BC's, all herbal medications, fish oil, and all vitamins.          Do not wear jewelry. Do not wear lotions, powders, cologne or deodorant. Do not shave 48 hours prior to surgery.  Men may shave face and neck. Do not bring valuables to the hospital.  [facility] is not responsible for any belongings or valuables.    Do NOT Smoke (Tobacco/Vaping)  24 hours prior to your procedure  If you use a CPAP at night, you may bring your mask for your overnight stay.   Contacts, glasses, hearing aids, dentures or partials may not be worn into surgery, please bring cases for these belongings   For patients admitted to the hospital, discharge time will be determined by your treatment team.   Patients discharged the day of surgery will not be allowed to drive home, and someone needs to stay with them for 24 hours.  Special instructions:    Oral Hygiene is also important to reduce your risk of infection.  Remember - BRUSH YOUR TEETH THE MORNING OF SURGERY WITH YOUR REGULAR TOOTHPASTE  [place]- Preparing For Surgery  Before surgery, you can play an important role. Because skin is not sterile, your skin needs to be as free of germs as possible. You can reduce the number of germs on your skin by washing with  Antibacterial Soap before surgery.     Please follow these instructions carefully.     Shower the NIGHT BEFORE SURGERY and the MORNING OF SURGERY with Antibacterial Soap.   Pat yourself dry with a CLEAN TOWEL.  Wear CLEAN PAJAMAS to bed the night before surgery  Place CLEAN SHEETS on your bed the night before your surgery  DO NOT SLEEP WITH PETS.  Day of Surgery:  Take a shower with Antibacterial soap. Wear Clean/Comfortable clothing the morning of surgery Do not apply any deodorants/lotions.   Remember to brush your teeth WITH YOUR REGULAR TOOTHPASTE.   If you test positive for Covid, or been in contact with anyone that has tested positive in the last 10 days, please notify your surgeon.  SURGICAL WAITING ROOM VISITATION Patients having surgery or a procedure may have no more than 2 support people in the waiting area - these visitors may rotate.   Children under the age of 22 must have an adult with them who is not the patient. If the patient needs to stay at the hospital during part of their recovery, the visitor guidelines for inpatient rooms apply. Pre-op nurse will coordinate an appropriate time for 1 support person to accompany patient in pre-op.  This support person may not rotate.   Please refer to the [facility] website for the visitor guidelines for Inpatients (after your surgery is over and you are in a regular room).

## 2021-12-01 NOTE — Progress Notes (Signed)
Interview done with the daughter-in-law Aldona Bar due to the pt having short term memory loss.  PCP - Dr. Bea Graff  Cardiologist - Dr. Martinique, P.  EP- Denies  Endocrine- Denies  Pulm- Denies  Chest x-ray - 11/04/21 (E)  EKG - 11/04/21 (E)  Stress Test - Denies  ECHO - 04/25/21 (E)  Cardiac Cath - 09/18/20 (E)  AICD-na PM-na LOOP-na  Nerve Stimulator- Denies  Dialysis- T-TH-Sa  Sleep Study - Denies CPAP - Denies  LABS- 12/03/21: CBC, CMP  ASA- Denies PLAVIX- Cont. ELIQUIS- LD- 10/4  ERAS- No  HA1C- Denies  Anesthesia- Yes- cardiac history  Aldona Bar denies the pt having chest pain, sob, or fever during the pre-op phone call. All instructions explained to [facility], with a verbal understanding of the material. Aldona Bar also instructed for him to wear a mask and social distance if he goes out. The opportunity to ask questions was provided.

## 2021-12-02 ENCOUNTER — Encounter (HOSPITAL_COMMUNITY): Payer: Self-pay | Admitting: Interventional Radiology

## 2021-12-02 ENCOUNTER — Other Ambulatory Visit: Payer: Self-pay | Admitting: Student

## 2021-12-02 ENCOUNTER — Other Ambulatory Visit: Payer: Self-pay | Admitting: Radiology

## 2021-12-02 DIAGNOSIS — I9789 Other postprocedural complications and disorders of the circulatory system, not elsewhere classified: Secondary | ICD-10-CM

## 2021-12-02 NOTE — Anesthesia Preprocedure Evaluation (Addendum)
Anesthesia Evaluation  Patient identified by MRN, date of birth, ID band Patient awake    Reviewed: Allergy & Precautions, NPO status , Patient's Chart, lab work & pertinent test results, reviewed documented beta blocker date and time   History of Anesthesia Complications Negative for: history of anesthetic complications  Airway Mallampati: II  TM Distance: >3 FB Neck ROM: Full    Dental  (+) Chipped,    Pulmonary neg pulmonary ROS,    Pulmonary exam normal        Cardiovascular hypertension, Pt. on home beta blockers and Pt. on medications + CAD (on Plavix), + Past MI (NSTEMI 03/2021), + Peripheral Vascular Disease and +CHF  Normal cardiovascular exam+ dysrhythmias (on Eliquis) Atrial Fibrillation   TTE 04/25/21: EF 25-30%, global hypokinesis, mild LVH, grade II DD, severe LAE, mild RAE, mild MR, mild dilatation of the ascending aorta measuring 54m  AAA without rupture   Neuro/Psych negative neurological ROS  negative psych ROS   GI/Hepatic GERD  ,(+) Cirrhosis       ,   Endo/Other  Hypothyroidism   Renal/GU ESRF and DialysisRenal disease (HD yesterday)  negative genitourinary   Musculoskeletal negative musculoskeletal ROS (+)   Abdominal   Peds  Hematology negative hematology ROS (+)   Anesthesia Other Findings Day of surgery medications reviewed with patient.  Reproductive/Obstetrics negative OB ROS                           Anesthesia Physical Anesthesia Plan  ASA: 4  Anesthesia Plan: MAC   Post-op Pain Management: Tylenol PO (pre-op)*   Induction:   PONV Risk Score and Plan: 1 and Treatment may vary due to age or medical condition, Propofol infusion and Ondansetron  Airway Management Planned: Natural Airway and Simple Face Mask  Additional Equipment: Arterial line  Intra-op Plan:   Post-operative Plan:   Informed Consent: I have reviewed the patients History and  Physical, chart, labs and discussed the procedure including the risks, benefits and alternatives for the proposed anesthesia with the patient or authorized representative who has indicated his/her understanding and acceptance.       Plan Discussed with: CRNA  Anesthesia Plan Comments: (See PAT note written 12/02/2021 by AMyra Gianotti PA-C. Multiple co-morbidities including CAD (not felt to be a good candidate for CABG or PCI), HFrEF/EF 25-30%, PAF, cirrhosis, ESRD and recently found to have enlargement of AAA to > 7 cm from a type II endoleak following previous EVAR in 2020. Per IR notation, [facility], possible embolization.  If anesthesia believes too high risk, we can discuss moderate sedation strategy.")      Anesthesia Quick Evaluation

## 2021-12-02 NOTE — Progress Notes (Signed)
Anesthesia Chart Review: Ricky Harris  Case: [id number] Date/Time: 12/03/21 0742   Procedure: Type II Endoleak   Anesthesia type: General   Pre-op diagnosis: AAA without rupture (I71.40)   Location: [place] [facility] [place]   Surgeons: Corrie Mckusick, DO       DISCUSSION:  Patient is a 81 year old male who is scheduled for the above procedure. He is s/p EVAR 11/23/230. Baseline excluded aneurysm on 03/22/19 was ~ 5.5 cm. He had ED visit for right chest/upper abdominal pain and CTA showed aneurysm ~ 7.1 cm, likely with type II endoleak from IMA. He was referred to IR by vascular surgeon Dr. Jamelle Haring. IR Dr. Earleen Newport evaluated Ricky Harris on 11/16/21 and recommended proceeding with treatment of endoleak, with first strategry of trans-arterial embolization. He added, "we will plan on GETA at [facility] with Dr. Earleen Newport, for aortogram, possible embolization.  If anesthesia believes too high risk, we can discuss moderate sedation strategy.  - If he decides to use surveillance strategy, we will set up office visit in 6 months with noncontrast CT."   History includes never smoker, CAD ("operative mortality would be prohibitive" for CABG 09/20/20 & PCI deferred given risk of bleeding for cirrhosis/varices; NSTEMI 04/25/21, medical therapy), murmur (mild MR, AV sclerosis without AS 03/2021), afib (09/18/20), HFrEF (diagnosed 08/2020), HTN, hypercholesterolemia, AAA (s/p EVAR 01/21/19), cryptogenic liver cirrhosis (thrombocytopenia, ascites, s/p multiple paracentesis, last 03/27/20; esophageal varies, s/p banding x3 12/19/17; small < 5 mm non-bleeding esophageal varices 02/14/20 EGD), ESRD (started HD 08/2019; LUE brachiocephalic AVF 0/76/22), prostate cancer (s/p prostatectomy; penile implant), hypothyroidism, 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   In July 2022 he was diagnosed with new HFrEF (LVEF 25-30%) and afib. He underwent LHC which showed multivessel  CAD with 85-90% proximal LAD followed by 50% D1 and 20% mid-distal LAD, 65% proximal CX, and 95-99% proximal RCA followed by 90% mid RCA.  He was evaluated by CT surgeon Dr. Cyndia Bent who thought his operative mortality would be prohibitive for CABG.  Cardiology did not think his RCA disease was amendable to PCI given tortuosity.  LAD could be treated with orbital atherectomy but there was concern about PCI and stenting giving risk of bleeding due to his cirrhosis and esophageal varices.  Medical therapy, including palliative care consult, were recommended.  He presented in February 2023 with chest pain and ruled in for NSTEMI.  His LVEF remained at 25-30%.  Again cardiology recommended treating medically "based off discussion with interventional cardiology, review of his prior cath, and comorbidities".  He was discharged on Eliquis and Plavix, metoprolol and Imdur continued, ASA discontinued, and Lipitor increased.   His last cardiology visit was on 09/10/2021 with Dr. Peter Martinique.  He was overall "holding his own."  He had had a few episodes of profound hypotension after dialysis including 1 visit to the ED with treatment of IVF. Troponin and EKG results were not felt acute. Dr. Martinique wrote, "Poor candidate for CABG or PCI. S/p NSTEMI in February 2023 managed medically. Fortunately he is not having any significant angina.  Continue Plavix for one year from Feb." Volume status managed by hemodialysis.  Recommended holding Aldactone and Imdur on dialysis days, and consider holding Toprol as well if persistent hypotension.  He has short-term memory loss. Family is involved in his care. They reported instructions to continue Plavix and hold Eliquis after 12/01/21 dose.   Multiple co-morbidities as outlined above and including CAD (no felt to  be a good candidate for CABG or PCI), HFrEF, PAF, cirrhosis, ESRD and recently found to have enlargement of AAA to > 7 cm from a type II endoleak. He had recent cardiology  follow-up with Dr. Martinique with continued medical therapy for CAD recommended. I have discussed case with anesthesiologist Suzette Battiest, MD. Anesthesia team to evaluate on the day of procedure and determine definitive anesthesia plan.    VS: Ht 6' 1"  (1.854 m)   Wt 72.6 kg   BMI 21.11 kg/m  BP Readings from Last 3 Encounters:  11/04/21 (!) 132/54  10/24/21 (!) 98/54  09/10/21 (!) 102/56   Pulse Readings from Last 3 Encounters:  11/04/21 (!) 53  10/24/21 76  09/10/21 93     PROVIDERS: [name]., MD is PCP  - Martinique, Peter, MD is cardiologist. Previously he saw Gwenith Spitz, MD with [facility].  Edrick Oh, MD is nephrologist - Otis Brace, MD is GI [place]). Seen at [facility] 09/20/17, but not felt to be a good candidate for liver transplantation at that time given CKD.  - Deitra Mayo, MD is vascular surgeon   LABS: For day of procedure as indicated. Last lab results in [facility] include: Lab Results  Component Value Date   WBC 10.5 11/04/2021   HGB 10.7 (L) 11/04/2021   HCT 33.0 (L) 11/04/2021   PLT 124 (L) 11/04/2021   GLUCOSE 111 (H) 11/04/2021   ALT 44 11/04/2021   AST 42 (H) 11/04/2021   NA 136 11/04/2021   K 4.7 11/04/2021   CL 99 11/04/2021   CREATININE 8.24 (H) 11/04/2021   BUN 69 (H) 11/04/2021   CO2 25 11/04/2021   TSH 4.650 (H) 09/18/2020   INR 1.3 (H) 11/04/2021   HGBA1C 4.5 (L) 04/25/2021     IMAGES: CTA Abd/pelvis 11/04/21: IMPRESSION: VASCULAR 1. Abdominal aortic aneurysm status post endograft repair. Accumulation of contrast within the excluded sac on the delayed images is consistent with endoleak. 2. Aneurysm sac has increased in size since prior examination now measuring 7.1 x 6.4 cm compared to 5.7 x 5.8 cm on 09/15/2020. 3. 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 NON-VASCULAR 1. Colonic diverticulosis. 2. Status post prostatectomy. 3. Atrophic kidneys. 4. 2 mm  nonobstructing right renal calculus.  CXR 11/04/21: FINDINGS: Cardiac size is within normal limits. There is elevation of left hemidiaphragm suggesting eventration or paralysis. There are linear densities in the lower lung fields. There is poor inspiration. There are no signs of pulmonary edema or focal consolidation. There is no significant pleural effusion or pneumothorax. IMPRESSION: There are linear densities in both lower lung fields suggesting subsegmental atelectasis. Part of this finding may be due to poor inspiration. There is elevation of left hemidiaphragm.    EKG: 11/04/21:  Normal sinus rhythm Left ventricular hypertrophy with repolarization abnormality ( R in aVL ) Abnormal ECG No significant change was found Confirmed by Oneal Deputy [id number]) on 11/04/2021 2:41:22 PM   CV: Echo 04/25/21: IMPRESSIONS   1. Left ventricular ejection fraction, by estimation, is 25 to 30%. The  left ventricle has severely decreased function. The left ventricle  demonstrates global hypokinesis. There is mild left ventricular  hypertrophy of the basal-septal segment. Left  ventricular diastolic parameters are consistent with Grade II diastolic  dysfunction (pseudonormalization).   2. Right ventricular systolic function is normal. The right ventricular  size is normal. Tricuspid regurgitation signal is inadequate for assessing  PA pressure.   3. Left atrial  size was severely dilated.   4. Right atrial size was mildly dilated.   5. The mitral valve is normal in structure. Mild mitral valve  regurgitation. No evidence of mitral stenosis.   6. The aortic valve is calcified. There is severe calcifcation of the  aortic valve. There is severe thickening of the aortic valve. Aortic valve  regurgitation is not visualized. Aortic valve sclerosis/calcification is  present, without any evidence of  aortic stenosis. Aortic valve area, by VTI measures 1.71 cm. Aortic valve  mean gradient measures  11.0 mmHg. Aortic valve Vmax measures 2.25 m/s.   7. Aortic dilatation noted. There is mild dilatation of the ascending  aorta, measuring 40 mm.   8. The inferior vena cava is normal in size with <50% respiratory  variability, suggesting right atrial pressure of 8 mmHg.  - Comparison 09/16/20: LVEF 25-30%, LV global hypokinesis, grade 1 DD, mildly reduced RVSF, RVSP 18.4 mmHg, trivial MR, mild-moderate AV sclerosis without AS, 40 mm ascending aorta; EF 60-65% 09/24/19 & 01/14/19; LVEF 60%, 4.0 cm ascending aorta 03/30/17)      Cardiac cath 09/18/20:   Prox LAD to Mid LAD lesion is 85% stenosed.   Mid LAD lesion is 50% stenosed.   Dist LAD lesion is 20% stenosed.   Prox Cx to Mid Cx lesion is 65% stenosed.   Prox RCA lesion is 95% stenosed.   Mid RCA lesion is 90% stenosed.   LV end diastolic pressure is mildly elevated.   Hemodynamic findings consistent with mild pulmonary hypertension.   There is severe coronary calcification involving the LAD and right coronary artery with mild calcification of the left circumflex vessel.   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   Mild right heart pressure elevation with mild pulmonary hypertension.   RECOMMENDATION: The patient has severe coronary calcification and three-vessel disease.  Echo Doppler has demonstrated reduced EF in the 25 to 30% range.  Recommend initial surgical consultation for consideration of possible CABG revascularization surgery.  However with the patient's significant comorbidities, if surgery is excluded, will need to review with colleagues regarding potential orbital atherectomy  and intervention into the LAD and RCA.  We will consider reinstitution of low-dose heparin much later today and this patient with recent PAF and ulcerated plaque in the proximal RCA. - Evaluated by CT surgeon Gilford Raid, MD on 09/20/20 who wrote, "I think his operative mortality would be prohibitive.  I would consider whether high risk atherectomy and PCI is an option or treat him medically." Cardiology did not think RCA disease was amendable to therapy due to tortuosity, LAD could be treated with orbital atherectomy however there was still concern about PCI with stenting given possibility of bleeding due to cirrhosis and esophageal varices.  For that reason medical therapy was recommended including palliative care.  EP did not think he was a good candidate for antiarrhythmic therapy or ablation for A-fib.   Past Medical History:  Diagnosis Date   A-fib ([place]) 09/18/2020   AAA (abdominal aortic aneurysm) ([place])    5.3cm , magd by vascular Dr Scot Dock ,   Anemia    " slightly"   Arthritis    knees   Ascites  Cancer [facility])    prostate   CHF (congestive heart failure) ([place])    HFrEF 08/2020   Chronic kidney disease    progressed to ESRD 08/2019   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[facility])    Coronary artery disease    on CT scan   Gout    H/O hypercholesterolemia    Heart murmur    History of falling    recent- see ct chest results of 04/25/2018   Hypertension    Hypothyroidism    Liver cirrhosis ([place])    Myocardial infarction ([place])    NSTEMI 04/25/21, treated medically   Prediabetes    pt denies    Renal artery stenosis ([place])    Short-term memory loss    Thrombocytopenia ([place])    Wears glasses     Past Surgical History:  Procedure Laterality Date   ABDOMINAL AORTIC ENDOVASCULAR STENT GRAFT N/A 01/21/2019   Procedure: ABDOMINAL AORTIC ENDOVASCULAR STENT GRAFT WITH CO2;  Surgeon: Angelia Mould, MD;  Location: [place];  Service: Vascular;  Laterality: N/A;   ABDOMINAL  AORTOGRAM W/LOWER EXTREMITY Bilateral 12/14/2018   Procedure: ABDOMINAL AORTOGRAM W/LOWER EXTREMITY;  Surgeon: Angelia Mould, MD;  Location: [place] CV LAB;  Service: Cardiovascular;  Laterality: Bilateral;   AV FISTULA PLACEMENT Left 03/12/2019   Procedure: LEFT ARTERIOVENOUS Arteriovenous FISTULA CREATION.;  Surgeon: Angelia Mould, MD;  Location: [facility] OR;  Service: Vascular;  Laterality: Left;   CATARACT EXTRACTION W/ INTRAOCULAR LENS  IMPLANT, BILATERAL     CHOLECYSTECTOMY     ESOPHAGEAL BANDING N/A 08/11/2017   Procedure: ESOPHAGEAL BANDING;  Surgeon: Otis Brace, MD;  Location: [place];  Service: Gastroenterology;  Laterality: N/A;   ESOPHAGEAL BANDING N/A 10/26/2017   Procedure: ESOPHAGEAL BANDING;  Surgeon: Otis Brace, MD;  Location: [facility];  Service: Gastroenterology;  Laterality: N/A;   ESOPHAGEAL BANDING N/A 12/19/2017   Procedure: ESOPHAGEAL BANDING;  Surgeon: Otis Brace, MD;  Location: [facility];  Service: Gastroenterology;  Laterality: N/A;   ESOPHAGOGASTRODUODENOSCOPY (EGD) WITH PROPOFOL N/A 07/05/2017   Procedure: ESOPHAGOGASTRODUODENOSCOPY (EGD) WITH PROPOFOL;  Surgeon: Otis Brace, MD;  Location: [place];  Service: Gastroenterology;  Laterality: N/A;   ESOPHAGOGASTRODUODENOSCOPY (EGD) WITH PROPOFOL N/A 08/11/2017   Procedure: ESOPHAGOGASTRODUODENOSCOPY (EGD) WITH PROPOFOL;  Surgeon: Otis Brace, MD;  Location: [place];  Service: Gastroenterology;  Laterality: N/A;   ESOPHAGOGASTRODUODENOSCOPY (EGD) WITH PROPOFOL N/A 10/26/2017   Procedure: ESOPHAGOGASTRODUODENOSCOPY (EGD) WITH PROPOFOL;  Surgeon: Otis Brace, MD;  Location: [facility];  Service: Gastroenterology;  Laterality: N/A;   ESOPHAGOGASTRODUODENOSCOPY (EGD) WITH PROPOFOL N/A 12/19/2017   Procedure: ESOPHAGOGASTRODUODENOSCOPY (EGD) WITH PROPOFOL;  Surgeon: Otis Brace, MD;  Location: [facility];  Service: Gastroenterology;  Laterality: N/A;    ESOPHAGOGASTRODUODENOSCOPY (EGD) WITH PROPOFOL N/A 10/29/2018   Procedure: ESOPHAGOGASTRODUODENOSCOPY (EGD) WITH PROPOFOL;  Surgeon: Otis Brace, MD;  Location: [facility];  Service: Gastroenterology;  Laterality: N/A;   ESOPHAGOGASTRODUODENOSCOPY (EGD) WITH PROPOFOL N/A 02/14/2020   Procedure: ESOPHAGOGASTRODUODENOSCOPY (EGD) WITH PROPOFOL;  Surgeon: Otis Brace, MD;  Location: [facility];  Service: Gastroenterology;  Laterality: N/A;   EYE SURGERY     bilateral cataract removal with lens placement   HERNIA REPAIR     IR PARACENTESIS  12/26/2018   IR PARACENTESIS  01/02/2019   IR PARACENTESIS  01/15/2019   IR PARACENTESIS  01/30/2019   IR PARACENTESIS  02/05/2019   IR PARACENTESIS  02/14/2019   IR PARACENTESIS  02/28/2019   IR PARACENTESIS  03/14/2019   IR PARACENTESIS  03/29/2019  IR PARACENTESIS  04/11/2019   IR PARACENTESIS  04/25/2019   IR PARACENTESIS  05/09/2019   IR PARACENTESIS  06/03/2019   IR PARACENTESIS  06/06/2019   IR PARACENTESIS  06/20/2019   IR PARACENTESIS  07/03/2019   IR PARACENTESIS  07/19/2019   IR PARACENTESIS  08/08/2019   IR PARACENTESIS  08/27/2019   IR PARACENTESIS  09/09/2019   IR PARACENTESIS  09/24/2019   IR PARACENTESIS  10/07/2019   IR PARACENTESIS  10/23/2019   IR PARACENTESIS  11/07/2019   IR PARACENTESIS  11/21/2019   IR PARACENTESIS  12/04/2019   IR PARACENTESIS  12/25/2019   IR PARACENTESIS  01/10/2020   IR PARACENTESIS  01/27/2020   IR PARACENTESIS  02/19/2020   IR PARACENTESIS  03/27/2020   IR RADIOLOGIST EVAL & MGMT  11/16/2021   PROSTATECTOMY     RIGHT AND LEFT HEART CATH N/A 09/18/2020   Procedure: RIGHT AND LEFT HEART CATH;  Surgeon: Troy Sine, MD;  Location: [place] CV LAB;  Service: Cardiovascular;  Laterality: N/A;   subtotal gastrectomy      MEDICATIONS: No current facility-administered medications for this encounter.    apixaban (ELIQUIS) 5 MG TABS tablet   atorvastatin (LIPITOR) 40 MG tablet   calcium acetate (PHOSLO)  667 MG capsule   cetirizine (ZYRTEC) 10 MG tablet   clopidogrel (PLAVIX) 75 MG tablet   donepezil (ARICEPT) 5 MG tablet   isosorbide mononitrate (IMDUR) 30 MG 24 hr tablet   levothyroxine (SYNTHROID) 50 MCG tablet   lidocaine (LIDODERM) 5 %   metoprolol succinate (TOPROL-XL) 25 MG 24 hr tablet   multivitamin (RENA-VIT) TABS tablet   nitroGLYCERIN (NITROSTAT) 0.4 MG SL tablet   ondansetron (ZOFRAN) 4 MG tablet   spironolactone (ALDACTONE) 25 MG tablet   traMADol (ULTRAM) 50 MG tablet   triamcinolone cream (KENALOG) 0.1 %    Myra Gianotti, PA-C Surgical Short Stay/Anesthesiology [facility] Phone [phone] [facility] Phone [phone] 12/02/2021 1:51 PM

## 2021-12-03 ENCOUNTER — Other Ambulatory Visit: Payer: Self-pay

## 2021-12-03 ENCOUNTER — Observation Stay (HOSPITAL_COMMUNITY)
Admission: RE | Admit: 2021-12-03 | Discharge: 2021-12-03 | Disposition: A | Discharge status: Home / Self Care | Payer: Medicare Other | Source: Ambulatory Visit | Attending: Interventional Radiology | Admitting: Interventional Radiology

## 2021-12-03 ENCOUNTER — Encounter (HOSPITAL_COMMUNITY): Payer: Self-pay | Admitting: Interventional Radiology

## 2021-12-03 ENCOUNTER — Observation Stay (HOSPITAL_COMMUNITY)
Admission: RE | Admit: 2021-12-03 | Discharge: 2021-12-03 | Disposition: A | Discharge status: Home / Self Care | Payer: Medicare Other | Attending: Interventional Radiology | Admitting: Interventional Radiology

## 2021-12-03 ENCOUNTER — Ambulatory Visit (HOSPITAL_COMMUNITY): Payer: Medicare Other | Admitting: Anesthesiology

## 2021-12-03 ENCOUNTER — Observation Stay (HOSPITAL_COMMUNITY): Admission: RE | Admit: 2021-12-03 | Payer: Medicare Other | Source: Ambulatory Visit

## 2021-12-03 ENCOUNTER — Ambulatory Visit (HOSPITAL_BASED_OUTPATIENT_CLINIC_OR_DEPARTMENT_OTHER): Payer: Medicare Other | Admitting: Anesthesiology

## 2021-12-03 ENCOUNTER — Encounter (HOSPITAL_COMMUNITY)
Admission: RE | Disposition: A | Discharge status: Home / Self Care | Payer: Self-pay | Source: Home / Self Care | Attending: Interventional Radiology

## 2021-12-03 DIAGNOSIS — N186 End stage renal disease: Secondary | ICD-10-CM

## 2021-12-03 DIAGNOSIS — I2510 Atherosclerotic heart disease of native coronary artery without angina pectoris: Secondary | ICD-10-CM | POA: Diagnosis not present

## 2021-12-03 DIAGNOSIS — I132 Hypertensive heart and chronic kidney disease with heart failure and with stage 5 chronic kidney disease, or end stage renal disease: Secondary | ICD-10-CM | POA: Diagnosis not present

## 2021-12-03 DIAGNOSIS — I120 Hypertensive chronic kidney disease with stage 5 chronic kidney disease or end stage renal disease: Secondary | ICD-10-CM | POA: Insufficient documentation

## 2021-12-03 DIAGNOSIS — I714 Abdominal aortic aneurysm, without rupture, unspecified: Secondary | ICD-10-CM | POA: Insufficient documentation

## 2021-12-03 DIAGNOSIS — I9789 Other postprocedural complications and disorders of the circulatory system, not elsewhere classified: Secondary | ICD-10-CM

## 2021-12-03 DIAGNOSIS — T82339A Leakage of unspecified vascular graft, initial encounter: Principal | ICD-10-CM | POA: Insufficient documentation

## 2021-12-03 DIAGNOSIS — I252 Old myocardial infarction: Secondary | ICD-10-CM | POA: Diagnosis not present

## 2021-12-03 DIAGNOSIS — Z95828 Presence of other vascular implants and grafts: Secondary | ICD-10-CM | POA: Diagnosis not present

## 2021-12-03 DIAGNOSIS — I509 Heart failure, unspecified: Secondary | ICD-10-CM

## 2021-12-03 DIAGNOSIS — Z992 Dependence on renal dialysis: Secondary | ICD-10-CM | POA: Diagnosis not present

## 2021-12-03 DIAGNOSIS — I701 Atherosclerosis of renal artery: Secondary | ICD-10-CM | POA: Diagnosis not present

## 2021-12-03 DIAGNOSIS — I082 Rheumatic disorders of both aortic and tricuspid valves: Secondary | ICD-10-CM | POA: Diagnosis not present

## 2021-12-03 DIAGNOSIS — Y832 Surgical operation with anastomosis, bypass or graft as the cause of abnormal reaction of the patient, or of later complication, without mention of misadventure at the time of the procedure: Secondary | ICD-10-CM | POA: Diagnosis not present

## 2021-12-03 HISTORY — PX: IR ANGIOGRAM PULMONARY BILATERAL SELECTIVE: IMG664

## 2021-12-03 HISTORY — PX: IR TRANSCATH/EMBOLIZ: IMG695

## 2021-12-03 HISTORY — PX: IR ANGIOGRAM VISCERAL SELECTIVE: IMG657

## 2021-12-03 HISTORY — PX: RADIOLOGY WITH ANESTHESIA: SHX6223

## 2021-12-03 HISTORY — DX: Other amnesia: R41.3

## 2021-12-03 HISTORY — PX: IR ANGIOGRAM SELECTIVE EACH ADDITIONAL VESSEL: IMG667

## 2021-12-03 HISTORY — DX: Acute myocardial infarction, unspecified: I21.9

## 2021-12-03 HISTORY — PX: IR CT SPINE LTD: IMG2387

## 2021-12-03 HISTORY — DX: Heart failure, unspecified: I50.9

## 2021-12-03 HISTORY — PX: IR US GUIDE VASC ACCESS RIGHT: IMG2390

## 2021-12-03 LAB — COMPREHENSIVE METABOLIC PANEL
ALT: 35 U/L (ref 0–44)
AST: 37 U/L (ref 15–41)
Albumin: 3.7 g/dL (ref 3.5–5.0)
Alkaline Phosphatase: 257 U/L — ABNORMAL HIGH (ref 38–126)
Anion gap: 16 — ABNORMAL HIGH (ref 5–15)
BUN: 32 mg/dL — ABNORMAL HIGH (ref 8–23)
CO2: 25 mmol/L (ref 22–32)
Calcium: 9 mg/dL (ref 8.9–10.3)
Chloride: 96 mmol/L — ABNORMAL LOW (ref 98–111)
Creatinine, Ser: 5.74 mg/dL — ABNORMAL HIGH (ref 0.61–1.24)
GFR, Estimated: 9 mL/min — ABNORMAL LOW (ref >60)
Glucose, Bld: 107 mg/dL — ABNORMAL HIGH (ref 70–99)
Potassium: 3.8 mmol/L (ref 3.5–5.1)
Sodium: 137 mmol/L (ref 135–145)
Total Bilirubin: 1.4 mg/dL — ABNORMAL HIGH (ref 0.3–1.2)
Total Protein: 7.6 g/dL (ref 6.5–8.1)

## 2021-12-03 LAB — POCT I-STAT, CHEM 8
BUN: 30 mg/dL — ABNORMAL HIGH (ref 8–23)
Calcium, Ion: 1.05 mmol/L — ABNORMAL LOW (ref 1.15–1.40)
Chloride: 97 mmol/L — ABNORMAL LOW (ref 98–111)
Creatinine, Ser: 6.1 mg/dL — ABNORMAL HIGH (ref 0.61–1.24)
Glucose, Bld: 116 mg/dL — ABNORMAL HIGH (ref 70–99)
HCT: 35 % — ABNORMAL LOW (ref 39.0–52.0)
Hemoglobin: 11.9 g/dL — ABNORMAL LOW (ref 13.0–17.0)
Potassium: 3.6 mmol/L (ref 3.5–5.1)
Sodium: 137 mmol/L (ref 135–145)
TCO2: 28 mmol/L (ref 22–32)

## 2021-12-03 LAB — CBC
HCT: 34.3 % — ABNORMAL LOW (ref 39.0–52.0)
Hemoglobin: 11.5 g/dL — ABNORMAL LOW (ref 13.0–17.0)
MCH: 35.4 pg — ABNORMAL HIGH (ref 26.0–34.0)
MCHC: 33.5 g/dL (ref 30.0–36.0)
MCV: 105.5 fL — ABNORMAL HIGH (ref 80.0–100.0)
Platelets: 146 10*3/uL — ABNORMAL LOW (ref 150–400)
RBC: 3.25 MIL/uL — ABNORMAL LOW (ref 4.22–5.81)
RDW: 15.7 % — ABNORMAL HIGH (ref 11.5–15.5)
WBC: 10.7 10*3/uL — ABNORMAL HIGH (ref 4.0–10.5)
nRBC: 0 % (ref 0.0–0.2)

## 2021-12-03 LAB — TYPE AND SCREEN
ABO/RH(D): AB POS
Antibody Screen: NEGATIVE

## 2021-12-03 SURGERY — IR WITH ANESTHESIA
Anesthesia: Monitor Anesthesia Care

## 2021-12-03 MED ORDER — THROMBIN FOR PERCUTANEOUS TREATMENT OF PSEUDOANEURYSM (5000UNITS/10ML)
PERCUTANEOUS | Status: AC | PRN
  Administered 2021-12-03: 1000 [IU]

## 2021-12-03 MED ORDER — FENTANYL CITRATE (PF) 100 MCG/2ML IJ SOLN: 25.0000 ug | INTRAMUSCULAR | Status: DC | PRN

## 2021-12-03 MED ORDER — SODIUM CHLORIDE 0.9 % IV SOLN: INTRAVENOUS | Status: DC

## 2021-12-03 MED ORDER — CHLORHEXIDINE GLUCONATE 0.12 % MT SOLN
15.0000 mL | OROMUCOSAL | Status: AC
  Administered 2021-12-03: 15 mL via OROMUCOSAL
  Filled 2021-12-03: qty 15

## 2021-12-03 MED ORDER — FENTANYL CITRATE (PF) 100 MCG/2ML IJ SOLN
INTRAMUSCULAR | Status: AC
  Filled 2021-12-03: qty 2

## 2021-12-03 MED ORDER — PROPOFOL 500 MG/50ML IV EMUL
INTRAVENOUS | Status: DC | PRN
  Administered 2021-12-03: 50 ug/kg/min via INTRAVENOUS

## 2021-12-03 MED ORDER — OXYCODONE HCL 5 MG/5ML PO SOLN: 5.0000 mg | Freq: Once | ORAL | Status: DC | PRN

## 2021-12-03 MED ORDER — PHENYLEPHRINE HCL-NACL 20-0.9 MG/250ML-% IV SOLN
INTRAVENOUS | Status: DC | PRN
  Administered 2021-12-03: 25 ug/min via INTRAVENOUS

## 2021-12-03 MED ORDER — PHENYLEPHRINE 80 MCG/ML (10ML) SYRINGE FOR IV PUSH (FOR BLOOD PRESSURE SUPPORT)
PREFILLED_SYRINGE | INTRAVENOUS | Status: DC | PRN
  Administered 2021-12-03: 80 ug via INTRAVENOUS

## 2021-12-03 MED ORDER — LIDOCAINE HCL (PF) 1 % IJ SOLN
INTRAMUSCULAR | Status: AC | PRN
  Administered 2021-12-03: 5 mL

## 2021-12-03 MED ORDER — ACETAMINOPHEN 500 MG PO TABS
1000.0000 mg | ORAL_TABLET | Freq: Once | ORAL | Status: AC
  Administered 2021-12-03: 1000 mg via ORAL
  Filled 2021-12-03: qty 2

## 2021-12-03 MED ORDER — OXYCODONE HCL 5 MG PO TABS: 5.0000 mg | ORAL_TABLET | Freq: Once | ORAL | Status: DC | PRN

## 2021-12-03 MED ORDER — IOHEXOL 300 MG/ML  SOLN
100.0000 mL | Freq: Once | INTRAMUSCULAR | Status: AC | PRN
  Administered 2021-12-03: 40 mL via INTRA_ARTERIAL

## 2021-12-03 MED ORDER — LIDOCAINE HCL 1 % IJ SOLN
INTRAMUSCULAR | Status: AC
  Filled 2021-12-03: qty 20

## 2021-12-03 MED ORDER — ONDANSETRON HCL 4 MG/2ML IJ SOLN
INTRAMUSCULAR | Status: DC | PRN
  Administered 2021-12-03: 4 mg via INTRAVENOUS

## 2021-12-03 MED ORDER — NITROGLYCERIN 1 MG/10 ML FOR IR/CATH LAB
INTRA_ARTERIAL | Status: AC
  Filled 2021-12-03: qty 10

## 2021-12-03 MED ORDER — IOHEXOL 300 MG/ML  SOLN
100.0000 mL | Freq: Once | INTRAMUSCULAR | Status: AC | PRN
  Administered 2021-12-03: 60 mL via INTRA_ARTERIAL

## 2021-12-03 MED ORDER — THROMBIN FOR PERCUTANEOUS TREATMENT OF PSEUDOANEURYSM (5000UNITS/10ML)
Freq: Once | PERCUTANEOUS | Status: DC
  Filled 2021-12-03: qty 1

## 2021-12-03 NOTE — Discharge Summary (Addendum)
Patient ID: Ricky Harris MRN: [id number] DOB/AGE: 80/04/1940 80 y.o.  Admit date: 12/03/2021 Discharge date: 12/03/2021  Supervising Physician: Corrie Mckusick  Patient Status: [facility] - In-pt  Admission Diagnoses: Type II Endoleak repair  Discharge Diagnoses:  Principal Problem:   Type II endoleak of aortic graft   Discharged Condition: good  Hospital Course:   Ricky Harris is a 81 y.o. male with significant medical history including but not limited to AAA with repair, aortic stenosis, CKD on dialysis, Cirrhosis, CAD, heart murmur, falls, and renal artery stenosis.   He was evaluated in clinic last month with Dr. Earleen Newport for Endoleak with enlarging aneurysm, found secondary to CP that resulted in presentation to ED and CT imaging. He elected to proceed with intervention which was planned for today in [facility] Radiology.  He underwent successful coil/thrombin embolization of the inferior aneurysm sac.  He was placed in observation for possible overnight monitoring due to his multiple medical comorbidities, however patient has recovered remarkably well and expresses desire to discharge home.  He lives with family who are available for care and supervision.  He has been able to eat with tolerance.  He is scheduled for regular outpatient dialysis tomorrow.  He is found to be in stable condition for discharge home this evening.  Care instructions provided at bedside by Dr. Earleen Newport.  Patient and family voice understanding of discharge plan as well as follow--up.  No changes to medication at this time.  Follow-up with Dr. Earleen Newport expected in 2-4 weeks.  Schedulers will contact him with date and time of follow-up.   Discharge Exam: Blood pressure (!) 116/56, pulse (!) 59, temperature 97.8 F (36.6 C), temperature source Oral, resp. rate 18, height 6' 1"  (1.854 m), weight 160 lb 0.9 oz (72.6 kg), SpO2 97 %. General appearance: alert, cooperative, and no distress Resp: clear to auscultation  bilaterally Cardio: regular rate and rhythm, S1, S2 normal, no murmur, click, rub or gallop GI: soft, non-tender; bowel sounds normal; no masses,  no organomegaly Skin: Skin color, texture, turgor normal. No rashes or lesions or bleeding.  Procedure site intact.  Non-tender to palpation.  No evidence of hematoma or pseudoaneurysm.   Disposition: Discharge disposition: 01-Home or Self Care       Discharge Instructions     Diet - low sodium heart healthy   Complete by: As directed    Discharge instructions   Complete by: As directed    Limited activity for the next 24 hrs.  Then increase activity slowing with careful monitoring of groin procedure site.  May remove groin dressing tomorrow and replace bandage/band-aid as needed. Schedulers will call with follow-up appointment date and time with Dr. Earleen Newport.   Increase activity slowly   Complete by: As directed    Remove dressing in 24 hours   Complete by: As directed       Allergies as of 12/03/2021       Reactions   Cephalexin Hives   Hydralazine Hives        Medication List     TAKE these medications    atorvastatin 40 MG tablet Commonly known as: LIPITOR Take 1 tablet (40 mg total) by mouth daily. What changed: when to take this   calcium acetate 667 MG capsule Commonly known as: PHOSLO Take 1 capsule (667 mg total) by mouth 3 (three) times daily with meals. What changed: how much to take   cetirizine 10 MG tablet Commonly known as: ZYRTEC Take 10 mg  by mouth daily as needed for allergies.   clopidogrel 75 MG tablet Commonly known as: PLAVIX Take 1 tablet (75 mg total) by mouth daily. What changed: when to take this   donepezil 5 MG tablet Commonly known as: ARICEPT Take 5 mg by mouth at bedtime.   Eliquis 5 MG Tabs tablet Generic drug: apixaban TAKE 1 TABLET(5 MG) BY MOUTH TWICE DAILY   isosorbide mononitrate 30 MG 24 hr tablet Commonly known as: IMDUR Take 1/2 tablet (15 mg total) by mouth  daily. What changed: when to take this   levothyroxine 50 MCG tablet Commonly known as: SYNTHROID Take 1 tablet (50 mcg total) by mouth daily.   lidocaine 5 % Commonly known as: Lidoderm Place 1 patch onto the skin daily. Remove & Discard patch within 12 hours or as directed by MD   metoprolol succinate 25 MG 24 hr tablet Commonly known as: TOPROL-XL Take 1 and 1/2 tablets (37.5 mg total) by mouth daily.   multivitamin Tabs tablet Take 1 tablet by mouth daily. What changed: when to take this   nitroGLYCERIN 0.4 MG SL tablet Commonly known as: NITROSTAT Place 0.4 mg under the tongue every 5 (five) minutes as needed for chest pain.   ondansetron 4 MG tablet Commonly known as: ZOFRAN Take 4 mg by mouth every 8 (eight) hours as needed for nausea or vomiting.   spironolactone 25 MG tablet Commonly known as: ALDACTONE Take 1/2 tablet (12.5 mg total) by mouth daily. What changed:  how much to take when to take this additional instructions   traMADol 50 MG tablet Commonly known as: ULTRAM Take 1 tablet (50 mg total) by mouth every 8 (eight) hours as needed for moderate pain.   triamcinolone cream 0.1 % Commonly known as: KENALOG Apply 1 Application topically daily as needed.        Follow-up Information     Corrie Mckusick, DO Follow up.   Specialties: Interventional Radiology, Radiology Why: Schedulers will contact you with follow-up information. Contact information: [address] [place] [id number] [phone]                  Electronically Signed: Docia Barrier, PA 12/03/2021, 4:23 PM   I have spent Greater Than 30 Minutes discharging [name].

## 2021-12-03 NOTE — Sedation Documentation (Addendum)
No foley needed as per Dr Earleen Newport as patient does not make urine and is scheduled for dialysis tomorrow. Dr Earleen Newport aware that last dose Plavix was 11/02/21 and last  dose Eliquis was 11/01/21. Last PT, PTT, INR was a month ago.

## 2021-12-03 NOTE — Sedation Documentation (Signed)
This is an anesthesia case

## 2021-12-03 NOTE — H&P (Signed)
Chief Complaint: Patient was seen in consultation today for aortic aneurysm.  Referring Physician(s): Dr. Stanford Breed  Supervising Physician: Corrie Mckusick  Patient Status: [facility] - Out-pt  History of Present Illness: Ricky Harris is a 81 y.o. male with significant medical history including but not limited to AAA with repair, aortic stenosis, CKD on dialysis, Cirrhosis, CAD, heart murmur, falls, and renal artery stenosis.   He was evaluated in clinic last month with Dr. Earleen Newport for Endoleak with enlarging aneurysm, found secondary to CP that resulted in presentation to ED and CT imaging.  During Clinic consult the option of endoleak repair and embolization was offered.  The patient and family have expressed desire to proceed with recommended treatment.   He presents today with his son, in his usual state of health, denying HA, change in vision, SOB, CP, palpitations, or abdominal pain.  He is NPO.  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[facility])    Coronary artery disease    on CT scan   Gout    H/O hypercholesterolemia    Heart murmur    History of falling    recent- see ct chest results of 04/25/2018   Hypertension    Hypothyroidism    Liver cirrhosis ([place])    Myocardial infarction ([place])    NSTEMI 04/25/21, treated medically   Prediabetes    pt denies    Renal artery stenosis ([place])    Short-term memory loss    Thrombocytopenia ([place])    Wears glasses     Past Surgical History:  Procedure Laterality Date   ABDOMINAL AORTIC ENDOVASCULAR STENT GRAFT N/A 01/21/2019   Procedure: ABDOMINAL AORTIC ENDOVASCULAR STENT GRAFT WITH CO2;  Surgeon:  Angelia Mould, MD;  Location: [place];  Service: Vascular;  Laterality: N/A;   ABDOMINAL AORTOGRAM W/LOWER EXTREMITY Bilateral 12/14/2018   Procedure: ABDOMINAL AORTOGRAM W/LOWER EXTREMITY;  Surgeon: Angelia Mould, MD;  Location: [place] [facility];  Service: Cardiovascular;  Laterality: Bilateral;   AV FISTULA PLACEMENT Left 03/12/2019   Procedure: LEFT ARTERIOVENOUS Arteriovenous FISTULA CREATION.;  Surgeon: Angelia Mould, MD;  Location: [facility] OR;  Service: Vascular;  Laterality: Left;   CATARACT EXTRACTION W/ INTRAOCULAR LENS  IMPLANT, BILATERAL     CHOLECYSTECTOMY     ESOPHAGEAL BANDING N/A 08/11/2017   Procedure: ESOPHAGEAL BANDING;  Surgeon: Otis Brace, MD;  Location: [place];  Service: Gastroenterology;  Laterality: N/A;   ESOPHAGEAL BANDING N/A 10/26/2017   Procedure: ESOPHAGEAL BANDING;  Surgeon: Otis Brace, MD;  Location: [facility];  Service: Gastroenterology;  Laterality: N/A;   ESOPHAGEAL BANDING N/A 12/19/2017   Procedure: ESOPHAGEAL BANDING;  Surgeon: Otis Brace, MD;  Location: [facility];  Service: Gastroenterology;  Laterality: N/A;   ESOPHAGOGASTRODUODENOSCOPY (EGD) WITH PROPOFOL N/A 07/05/2017   Procedure: ESOPHAGOGASTRODUODENOSCOPY (EGD) WITH PROPOFOL;  Surgeon: Otis Brace, MD;  Location: [place];  Service: Gastroenterology;  Laterality: N/A;   ESOPHAGOGASTRODUODENOSCOPY (EGD) WITH PROPOFOL N/A 08/11/2017   Procedure: ESOPHAGOGASTRODUODENOSCOPY (EGD) WITH PROPOFOL;  Surgeon: Otis Brace, MD;  Location: [facility]  ENDOSCOPY;  Service: Gastroenterology;  Laterality: N/A;   ESOPHAGOGASTRODUODENOSCOPY (EGD) WITH PROPOFOL N/A 10/26/2017   Procedure: ESOPHAGOGASTRODUODENOSCOPY (EGD) WITH PROPOFOL;  Surgeon: Otis Brace, MD;  [facility];  Service: Gastroenterology;  Laterality: N/A;   ESOPHAGOGASTRODUODENOSCOPY (EGD) WITH PROPOFOL N/A 12/19/2017   Procedure: ESOPHAGOGASTRODUODENOSCOPY (EGD) WITH PROPOFOL;   Surgeon: Otis Brace, MD;  [facility];  Service: Gastroenterology;  Laterality: N/A;   ESOPHAGOGASTRODUODENOSCOPY (EGD) WITH PROPOFOL N/A 10/29/2018   Procedure: ESOPHAGOGASTRODUODENOSCOPY (EGD) WITH PROPOFOL;  Surgeon: Otis Brace, MD;  [facility];  Service: Gastroenterology;  Laterality: N/A;   ESOPHAGOGASTRODUODENOSCOPY (EGD) WITH PROPOFOL N/A 02/14/2020   Procedure: ESOPHAGOGASTRODUODENOSCOPY (EGD) WITH PROPOFOL;  Surgeon: Otis Brace, MD;  [facility];  Service: Gastroenterology;  Laterality: N/A;   EYE SURGERY     bilateral cataract removal with lens placement   HERNIA REPAIR     IR PARACENTESIS  12/26/2018   IR PARACENTESIS  01/02/2019   IR PARACENTESIS  01/15/2019   IR PARACENTESIS  01/30/2019   IR PARACENTESIS  02/05/2019   IR PARACENTESIS  02/14/2019   IR PARACENTESIS  02/28/2019   IR PARACENTESIS  03/14/2019   IR PARACENTESIS  03/29/2019   IR PARACENTESIS  04/11/2019   IR PARACENTESIS  04/25/2019   IR PARACENTESIS  05/09/2019   IR PARACENTESIS  06/03/2019   IR PARACENTESIS  06/06/2019   IR PARACENTESIS  06/20/2019   IR PARACENTESIS  07/03/2019   IR PARACENTESIS  07/19/2019   IR PARACENTESIS  08/08/2019   IR PARACENTESIS  08/27/2019   IR PARACENTESIS  09/09/2019   IR PARACENTESIS  09/24/2019   IR PARACENTESIS  10/07/2019   IR PARACENTESIS  10/23/2019   IR PARACENTESIS  11/07/2019   IR PARACENTESIS  11/21/2019   IR PARACENTESIS  12/04/2019   IR PARACENTESIS  12/25/2019   IR PARACENTESIS  01/10/2020   IR PARACENTESIS  01/27/2020   IR PARACENTESIS  02/19/2020   IR PARACENTESIS  03/27/2020   IR RADIOLOGIST EVAL & MGMT  11/16/2021   PROSTATECTOMY     RIGHT AND LEFT HEART CATH N/A 09/18/2020   Procedure: RIGHT AND LEFT HEART CATH;  Surgeon: Troy Sine, MD;  Location: [place] [facility];  Service: Cardiovascular;  Laterality: N/A;   subtotal gastrectomy      Allergies: Cephalexin and Hydralazine  Medications: Prior to Admission  medications   Medication Sig Start Date End Date Taking? Authorizing Provider  atorvastatin (LIPITOR) 40 MG tablet Take 1 tablet (40 mg total) by mouth daily. Patient taking differently: Take 40 mg by mouth at bedtime. 04/28/21  Yes Dessa Phi, DO  calcium acetate (PHOSLO) 667 MG capsule Take 1 capsule (667 mg total) by mouth 3 (three) times daily with meals. Patient taking differently: Take 1,334 mg by mouth 3 (three) times daily with meals. 02/20/20  Yes Rai, Ripudeep K, MD  cetirizine (ZYRTEC) 10 MG tablet Take 10 mg by mouth daily as needed for allergies.   Yes [provider]  clopidogrel (PLAVIX) 75 MG tablet Take 1 tablet (75 mg total) by mouth daily. Patient taking differently: Take 75 mg by mouth at bedtime. 04/28/21  Yes Dessa Phi, DO  donepezil (ARICEPT) 5 MG tablet Take 5 mg by mouth at bedtime. 11/27/21  Yes [provider]  isosorbide mononitrate (IMDUR) 30 MG 24 hr tablet Take 1/2 tablet (15 mg total) by mouth daily. Patient taking differently: Take 15 mg by mouth at bedtime. 04/27/21  Yes Dessa Phi, DO  levothyroxine (SYNTHROID) 50 MCG tablet Take  1 tablet (50 mcg total) by mouth daily. 04/27/21  Yes Dessa Phi, DO  metoprolol succinate (TOPROL-XL) 25 MG 24 hr tablet Take 1 and 1/2 tablets (37.5 mg total) by mouth daily. 04/27/21 12/03/21 Yes Dessa Phi, DO  multivitamin (RENA-VIT) TABS tablet Take 1 tablet by mouth daily. Patient taking differently: Take 1 tablet by mouth at bedtime. 09/27/19  Yes Autry-Lott, Naaman Plummer, DO  spironolactone (ALDACTONE) 25 MG tablet Take 1/2 tablet (12.5 mg total) by mouth daily. Patient taking differently: Take 25 mg by mouth See admin instructions. Takes Mon, [place], Friday and Sunday in the morning 04/27/21  Yes Dessa Phi, DO  apixaban (ELIQUIS) 5 MG TABS tablet TAKE 1 TABLET(5 MG) BY MOUTH TWICE DAILY 08/17/21   Martinique, Peter M, MD  lidocaine (LIDODERM) 5 % Place 1 patch onto the skin daily. Remove & Discard patch  within 12 hours or as directed by MD Patient not taking: Reported on 11/30/2021 11/04/21   Kommor, Debe Coder, MD  nitroGLYCERIN (NITROSTAT) 0.4 MG SL tablet Place 0.4 mg under the tongue every 5 (five) minutes as needed for chest pain.    [provider]  ondansetron (ZOFRAN) 4 MG tablet Take 4 mg by mouth every 8 (eight) hours as needed for nausea or vomiting. 08/30/21   [provider]  traMADol (ULTRAM) 50 MG tablet Take 1 tablet (50 mg total) by mouth every 8 (eight) hours as needed for moderate pain. 03/12/19   Rhyne, Hulen Shouts, PA-C  triamcinolone cream (KENALOG) 0.1 % Apply 1 Application topically daily as needed. 11/07/21   [provider]     Family History  Problem Relation Age of Onset   Diabetes Mellitus II Mother    Stroke Mother     Social History   Socioeconomic History   Marital status: Divorced    Spouse name: Not on file   Number of children: Not on file   Years of education: Not on file   Highest education level: Not on file  Occupational History   Not on file  Tobacco Use   Smoking status: Never   Smokeless tobacco: Never  Vaping Use   Vaping Use: Never used  Substance and Sexual Activity   Alcohol use: Not Currently   Drug use: Never   Sexual activity: Not on file  Other Topics Concern   Not on file  Social History Narrative   Not on file   Social Determinants of Health   Financial Resource Strain: Not on file  Food Insecurity: Not on file  Transportation Needs: Not on file  Physical Activity: Not on file  Stress: Not on file  Social Connections: Not on file    Review of Systems: A 12 point ROS discussed and pertinent positives are indicated in the HPI above.  All other systems are negative.  Vital Signs: BP (!) 94/52   Pulse 71   Temp 97.9 F (36.6 C) (Oral)   Resp 17   Ht 6' 1"  (1.854 m)   Wt 160 lb 0.9 oz (72.6 kg)   SpO2 95%   BMI 21.12 kg/m   Physical Exam Constitutional:      General: He is not in acute  distress. HENT:     Head: Normocephalic and atraumatic.     Mouth/Throat:     Mouth: Mucous membranes are moist.     Pharynx: Oropharynx is clear.  Eyes:     Extraocular Movements: Extraocular movements intact.     Conjunctiva/sclera: Conjunctivae normal.  Cardiovascular:  Rate and Rhythm: Normal rate.     Pulses: Normal pulses.     Heart sounds: Murmur heard.  Pulmonary:     Effort: Pulmonary effort is normal. No respiratory distress.  Abdominal:     General: Abdomen is flat.     Palpations: Abdomen is soft.     Tenderness: There is no abdominal tenderness.  Skin:    General: Skin is dry.  Neurological:     General: No focal deficit present.     Mental Status: He is alert and oriented to person, place, and time.  Psychiatric:        Mood and Affect: Mood normal.        Behavior: Behavior normal.     Imaging: IR Radiologist Eval & Mgmt  Result Date: 11/16/2021 Please refer to notes tab for details about interventional procedure. (Op Note)  CT Angio Abd/Pel w/ and/or w/o  Result Date: 11/04/2021 CLINICAL DATA:  Abdominal aortic aneurysm status post endograft repair Evaluate for endoleak EXAM: CTA ABDOMEN AND PELVIS WITHOUT AND WITH CONTRAST TECHNIQUE: Multidetector CT imaging of the abdomen and pelvis was performed using the standard protocol during bolus administration of intravenous contrast. Multiplanar reconstructed images and MIPs were obtained and reviewed to evaluate the vascular anatomy. RADIATION DOSE REDUCTION: This exam was performed according to the departmental dose-optimization program which includes automated exposure control, adjustment of the mA and/or kV according to patient size and/or use of iterative reconstruction technique. CONTRAST:  131m OMNIPAQUE IOHEXOL 350 MG/ML SOLN COMPARISON:  CT abdomen pelvis with contrast 11/04/2021 CT angiography chest abdomen pelvis 08/16/2020 CT abdomen pelvis 02/08/2020 FINDINGS: VASCULAR Aorta: Abdominal aortic aneurysm  status post endograft repair is again seen. Maximum diameter of the aneurysm sac measures 7.1 x 6.4 cm compared to 5.7 x 5.8 cm on prior examination from 09/15/2020. There is accumulation of contrast within the excluded portion of the aneurysm on the delayed images, consistent with endoleak. Celiac: Patent without evidence of aneurysm, dissection, vasculitis or significant stenosis. SMA: Patent without evidence of aneurysm, dissection, vasculitis or significant stenosis. Renals: Severe stenosis at the origin of both main renal arteries. IMA: Occluded at the origin. Opacification of distal branches consistent with retrograde collateral flow. Inflow: No significant abnormality of the stented right common iliac artery. No significant stenosis of the right internal or external iliac arteries. Unchanged moderate to severe stenosis of the left common iliac artery stent just above the level of the bifurcation. Left common iliac, internal iliac, and external iliac arteries otherwise patent. Proximal Outflow: Bilateral common femoral and visualized portions of the superficial and profunda femoral arteries are patent without evidence of aneurysm, dissection, vasculitis or significant stenosis. Veins: No obvious venous abnormality within the limitations of this arterial phase study. Review of the MIP images confirms the above findings. NON-VASCULAR Lower chest: Unchanged chronic elevation of left hemidiaphragm. Coronary calcifications are present. Heart is mildly enlarged. Hepatobiliary: No focal liver abnormality is seen. Status post cholecystectomy. No biliary dilatation. Pancreas: Unremarkable. No pancreatic ductal dilatation or surrounding inflammatory changes. Spleen: Normal in size without focal abnormality. Adrenals/Urinary Tract: Adrenal glands are normal. 2 mm nonobstructing calculus again seen in the lower pole of the right kidney. Atrophic kidneys are consistent with chronic renal failure. Multiple bilateral simple  renal cysts do not require further imaging follow-up. 1.1 cm exophytic lesion at the lower pole the left kidney measures higher than simple fluid attenuation. This is favored to be a mildly complicated cysts given no significant change in size since 02/18/2020. Evaluation  the bladder is limited due to collapse configuration. Stomach/Bowel: Normal appendix. No bowel dilatation to indicate ileus or obstruction. Descending and sigmoid colon diverticulosis without evidence of acute diverticulitis. Lymphatic: No enlarged abdominopelvic lymph nodes. Reproductive: Postsurgical changes of prostatectomy. Penile prosthesis reservoirs seen in the right inguinal region and left pelvis. Soft tissue density posterior to the right inguinal canal reservoir measuring approximately 3 cm is unchanged dating back to 02/18/2020 which favors postsurgical change/granulation tissue. Other: Moderate size fat containing bilateral inguinal hernias. Musculoskeletal: No acute or significant osseous findings. IMPRESSION: VASCULAR 1. Abdominal aortic aneurysm status post endograft repair. Accumulation of contrast within the excluded sac on the delayed images is consistent with endoleak. 2. Aneurysm sac has increased in size since prior examination now measuring 7.1 x 6.4 cm compared to 5.7 x 5.8 cm on 09/15/2020. 3. 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 NON-VASCULAR 1. Colonic diverticulosis. 2. Status post prostatectomy. 3. Atrophic kidneys. 4. 2 mm nonobstructing right renal calculus. Electronically Signed   By: Miachel Roux M.D.   On: 11/04/2021 16:23   CT ABDOMEN PELVIS W CONTRAST  Result Date: 11/04/2021 CLINICAL DATA:  Abdominal pain EXAM: CT ABDOMEN AND PELVIS WITH CONTRAST TECHNIQUE: Multidetector CT imaging of the abdomen and pelvis was performed using the standard protocol following bolus administration of intravenous contrast. RADIATION DOSE REDUCTION: This exam was performed according to  the departmental dose-optimization program which includes automated exposure control, adjustment of the mA and/or kV according to patient size and/or use of iterative reconstruction technique. CONTRAST:  16m OMNIPAQUE IOHEXOL 300 MG/ML  SOLN COMPARISON:  09/15/2020 FINDINGS: Lower chest: Coronary artery calcifications are seen. Left hemidiaphragm is elevated. There are linear patchy density seen in the posterior lower lung fields, more so on the right side. Hepatobiliary: There is fatty infiltration in liver. There is no dilation of bile ducts. Surgical clips are seen in gallbladder fossa. Pancreas: No focal abnormalities are seen. Spleen: Spleen measures 14 cm in maximum diameter. Adrenals/Urinary Tract: Adrenals are unremarkable. There is cortical thinning in both kidneys. A 2 mm calculus in the lower pole of right kidney. There is possible 1 mm calculus in the midportion of right kidney. There are a few smooth marginated low-density lesions in both kidneys largest measuring 2.3 cm in diameter. Density measurement in the largest of the lesions is less than 17 Hounsfield units suggesting renal cysts. Some of the subcentimeter low-density lesions could not be optimally characterized. Stomach/Bowel: Stomach is unremarkable. Small bowel loops are not dilated. Appendix is unremarkable. There is no wall thickening in colon. Diverticula are seen in colon without signs of focal acute diverticulitis. Vascular/Lymphatic: There is previous endovascular stent repair of infrarenal aortic aneurysm. The native aneurysm measures 7.2 x 6.4 cm. The native aneurysmal sac measured 6.1 cm in size in the previous study. There is extravasation of contrast into the native aneurysmal sac suggesting endoleak. Extravasation appears to be arising from the area of attachment of left common iliac stent. There is no contrast extravasation outside the aneurysmal sac. There is no retroperitoneal hematoma. Reproductive: Prostate is not seen.  There are fluid density structures in right inguinal canal and immediately behind the left rectus muscle in suprapubic region, devices used for management of erectile dysfunction. Other: There is no ascites or pneumoperitoneum. Small paraumbilical hernia containing fat is seen. Bilateral inguinal hernias containing fat are seen. Musculoskeletal: Degenerative changes are noted in lumbar spine, more so at the L3-L4 and L4-L5 levels. IMPRESSION: There is previous endovascular  stent repair of infrarenal aortic aneurysm. There is extravasation of contrast into the native aneurysmal sac consistent with endoleak. Native aneurysmal sac measures up to 7.2 cm in maximum diameter which was 6.1 cm in the previous study done on 09/15/2020. there is no evidence of extravasation of contrast outside the aneurysmal sac. There is no retroperitoneal hematoma. There is no hemoperitoneum. There is no evidence of intestinal obstruction or pneumoperitoneum. There is no hydronephrosis. There is marked cortical thinning in the native kidneys, possibly suggesting medical renal disease. Small right renal calculus. Bilateral renal cysts. Fatty liver.  Enlarged spleen.  Diverticulosis of colon. Coronary artery disease. There are small patchy densities in both lower lung fields suggesting scarring or subsegmental atelectasis/pneumonia. Other findings as described in the body of the report. Imaging finding of endoleak in previously repaired aortic aneurysmal sac was relayed to patient's provider Dr. Ernesto Rutherford by telephone call. Electronically Signed   By: Elmer Picker M.D.   On: 11/04/2021 14:06   DG Chest 2 View  Result Date: 11/04/2021 CLINICAL DATA:  Shortness of breath, right chest pain EXAM: CHEST - 2 VIEW COMPARISON:  Previous studies including the examination of 10/23/2021 FINDINGS: Cardiac size is within normal limits. There is elevation of left hemidiaphragm suggesting eventration or paralysis. There are linear densities in the  lower lung fields. There is poor inspiration. There are no signs of pulmonary edema or focal consolidation. There is no significant pleural effusion or pneumothorax. IMPRESSION: There are linear densities in both lower lung fields suggesting subsegmental atelectasis. Part of this finding may be due to poor inspiration. There is elevation of left hemidiaphragm. Electronically Signed   By: Elmer Picker M.D.   On: 11/04/2021 12:16    Labs:  CBC: Recent Labs    09/07/21 2123 10/23/21 2255 11/04/21 1150 12/03/21 0601 12/03/21 0622  WBC 10.3 11.8* 10.5  --  10.7*  HGB 11.3* 12.1* 10.7* 11.9* 11.5*  HCT 34.7* 36.3* 33.0* 35.0* 34.3*  PLT 134* 193 124*  --  146*    COAGS: Recent Labs    04/25/21 1427 10/23/21 2255 11/04/21 1432  INR  --  1.3* 1.3*  APTT 33 31 33    BMP: Recent Labs    07/13/21 1751 09/07/21 2123 10/23/21 2255 11/04/21 1150 12/03/21 0601  NA 137 138 135 136 137  K 3.3* 4.0 4.4 4.7 3.6  CL 101 99 95* 99 97*  CO2 26 25 26 25   --   GLUCOSE 109* 92 111* 111* 116*  BUN 46* 41* 33* 69* 30*  CALCIUM 8.8* 8.5* 9.4 9.4  --   CREATININE 3.90* 5.42* 4.79* 8.24* 6.10*  GFRNONAA 15* 10* 12* 6*  --     LIVER FUNCTION TESTS: Recent Labs    10/23/21 2255 11/04/21 1150  BILITOT 0.9 0.6  AST 43* 42*  ALT 68* 44  ALKPHOS 298* 232*  PROT 7.6 6.6  ALBUMIN 3.5 3.3*    Assessment and Plan:  Aortic aneurysm s/p repair with endoleak and enlargement aneurysmal sac. Presents today for aortogram with intended intervention, presumably coil embolization Plan to stay overnight for observation and discharge tomorrow.  Saturday is regular dialysis day.  May be able to have here before discharge.   Labs, vitals, and history reviewed.  OK to proceed.  Risks and benefits of aortogram with embolization were discussed with the patient including, but not limited to bleeding, infection, vascular injury or contrast induced renal failure.  This interventional procedure  involves the use of X-rays and because  of the nature of the planned procedure, it is possible that we will have prolonged use of X-ray fluoroscopy.  Potential radiation risks to you include (but are not limited to) the following: - A slightly elevated risk for cancer  several years later in life. This risk is typically less than 0.5% percent. This risk is low in comparison to the normal incidence of human cancer, which is 33% for women and 50% for men according to the [employer]. - Radiation induced injury can include skin redness, resembling a rash, tissue breakdown / ulcers and hair loss (which can be temporary or permanent).   The likelihood of either of these occurring depends on the difficulty of the procedure and whether you are sensitive to radiation due to previous procedures, disease, or genetic conditions.   IF your procedure requires a prolonged use of radiation, you will be notified and given written instructions for further action.  It is your responsibility to monitor the irradiated area for the 2 weeks following the procedure and to notify your physician if you are concerned that you have suffered a radiation induced injury.    All of the patient's questions were answered, patient is agreeable to proceed.  Consent signed and in chart.    Thank you for this interesting consult.  I greatly enjoyed meeting Ricky Harris and look forward to participating in their care.  A copy of this report was sent to the requesting provider on this date.  Electronically Signed: Pasty Spillers, PA 12/03/2021, 8:05 AM   I spent a total of 30 Minutes  in face to face in clinical consultation, greater than 50% of which was counseling/coordinating care for AAA

## 2021-12-03 NOTE — Procedures (Addendum)
Interventional Radiology Procedure Note  Procedure:  US guided right CFA access Aortic, mesenteric (celiac & SMA) and iliac angiogram. Coil/thrombin embolization of the inferior aneurysm sac via Celiac --> IMA route Flat panel CT CELT for hemostasis  Full findings in report. IMA access yielded partial opacification of the aneurysm sac, which was embolized.  No clear inflow identified on the right/left iliac or SMA angio  EBL: ~50cc  Anesthesia: MAC with anesthesia  Complications: None  Recommendations:  - to PACU for recovery.  When goals met there, can DC home. CELT for hemostasis which has indication for immediate ambulation - Do not submerge for 7 days - Follow up with Dr. Earleen Newport in 2-4 weeks post op. No imaging required - Routine wound care  Signed,  Dulcy Fanny. Earleen Newport, DO, ABVM, RPVI

## 2021-12-03 NOTE — Transfer of Care (Signed)
Immediate Anesthesia Transfer of Care Note  Patient: Ricky Harris  Procedure(s) Performed: Type II Endoleak  Patient Location: PACU  Anesthesia Type:MAC  Level of Consciousness: awake, alert  and oriented  Airway & Oxygen Therapy: Patient Spontanous Breathing  Post-op Assessment: Report given to RN and Post -op Vital signs reviewed and stable  Post vital signs: Reviewed and stable  Last Vitals:  Vitals Value Taken Time  BP 99/47 12/03/21 1031  Temp    Pulse 65 12/03/21 1033  Resp 19 12/03/21 1033  SpO2 98 % 12/03/21 1033  Vitals shown include unvalidated device data.  Last Pain:  Vitals:   12/03/21 0634  TempSrc:   PainSc: 2       Patients Stated Pain Goal: 0 (62/56/38 9373)  Complications: No notable events documented.

## 2021-12-03 NOTE — Anesthesia Procedure Notes (Signed)
Arterial Line Insertion Start/End10/07/2021 7:20 AM Performed by: Carolan Clines, CRNA, CRNA  Patient location: Pre-op. Preanesthetic checklist: patient identified, IV checked, site marked, risks and benefits discussed, surgical consent, monitors and equipment checked, pre-op evaluation, timeout performed and anesthesia consent Lidocaine 1% used for infiltration Right, radial was placed Catheter size: 20 G Hand hygiene performed  and maximum sterile barriers used   Attempts: 1 Procedure performed without using ultrasound guided technique. Following insertion, dressing applied and Biopatch. Post procedure assessment: normal and unchanged  Patient tolerated the procedure well with no immediate complications.

## 2021-12-03 NOTE — Sedation Documentation (Signed)
Taken to PACU #8 on stretcher with Dietitian. Report given to PACU RN. Dressing Right groin checked with PACU RN and is gauze with tegaderm and is dry and intact and site is level 0. RN aware Plavix to be continued and may restart Eliquis tomorrow. Report given by [facility] CRNA.

## 2021-12-03 NOTE — Anesthesia Procedure Notes (Signed)
Procedure Name: MAC Date/Time: 12/03/2021 8:18 AM  Performed by: Carolan Clines, CRNAPre-anesthesia Checklist: Patient identified, Emergency Drugs available, Suction available and Patient being monitored Patient Re-evaluated:Patient Re-evaluated prior to induction Oxygen Delivery Method: Simple face mask Dental Injury: Teeth and Oropharynx as per pre-operative assessment

## 2021-12-03 NOTE — Anesthesia Postprocedure Evaluation (Signed)
Anesthesia Post Note  Patient: Ricky Harris  Procedure(s) Performed: Type II Endoleak     Patient location during evaluation: PACU Anesthesia Type: MAC Level of consciousness: awake and alert Pain management: pain level controlled Vital Signs Assessment: post-procedure vital signs reviewed and stable Respiratory status: spontaneous breathing, nonlabored ventilation and respiratory function stable Cardiovascular status: blood pressure returned to baseline Postop Assessment: no apparent nausea or vomiting Anesthetic complications: no   No notable events documented.  Last Vitals:  Vitals:   12/03/21 1115 12/03/21 1130  BP: (!) 98/49 (!) 104/49  Pulse: 60 62  Resp: 15 18  Temp:    SpO2: 93% 91%    Last Pain:  Vitals:   12/03/21 1130  TempSrc:   PainSc: 0-No pain                 Marthenia Rolling

## 2021-12-03 NOTE — Progress Notes (Signed)
Pt received from PACU.  Right groin assessed, level zero. Pts skin unremarkable.  CHG performed, VS taken, CCMD contacted.  Pt in afib with brady rate.  Son at bedside, plan of care reviewed.  Will cont to monitor.

## 2021-12-03 NOTE — Sedation Documentation (Signed)
Celt device deployed at 1014 right femoral artery. No hematoma. Site level 0 May restart Eliquis tomorrow and continue with taking Plavix as per Dr Earleen Newport.

## 2021-12-04 ENCOUNTER — Encounter (HOSPITAL_COMMUNITY): Payer: Self-pay | Admitting: Interventional Radiology

## 2021-12-06 ENCOUNTER — Encounter (HOSPITAL_COMMUNITY): Payer: Self-pay | Admitting: Radiology

## 2021-12-06 ENCOUNTER — Other Ambulatory Visit (HOSPITAL_COMMUNITY): Payer: Self-pay | Admitting: Interventional Radiology

## 2021-12-06 DIAGNOSIS — I714 Abdominal aortic aneurysm, without rupture, unspecified: Secondary | ICD-10-CM

## 2022-01-19 NOTE — Progress Notes (Deleted)
Cardiology Office Note   Date:  01/19/2022   ID:  Ricky Harris, DOB 04-18-1940, MRN [id number]  PCP:  Ricky Mina., MD  Cardiologist:   Ricky Mario Martinique, MD   No chief complaint on file.     History of Present Illness: Ricky Harris is a 81 y.o. male who presents for follow up CAD. 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  He presented to the emergency department on 04/25/2021 with acute onset of central chest discomfort.  He reported his pain was an 8 out of 10.  He also describes a tightness in his chest.  He noted radiation to his left arm.  His EKG showed sinus tachycardia with incomplete right bundle branch block.  He was also felt to have 2 mm anterior lateral ST segment elevation with inferior lateral T wave inversion.  A repeat EKG showed sinus rhythm with a rate of 94 and PACs.  He was started on IV heparin.  Cardiology was consulted.  Medical management was recommended.  His IV heparin was continued for 48 hours and he was transitioned to Eliquis and Plavix.  Echocardiogram 04/25/2021 showed an EF of 25-30% with left ventricular global hypokinesis.  He was noted to have G2 DD, severely dilated left atria and mildly dilated right atrium.  Mild mitral valve regurgitation was noted. He was treated with Imdur and metoprolol and higher statin dose.    He previously underwent cardiac catheterization 09/18/2020.  During that time he was noted to have proximal LAD-mid LAD 85% stenosis, LAD 50% stenosis, distal LAD 20% stenosis, proximal circumflex-mid circumflex 65%, proximal RCA 95%, mid RCA 90% and his LV EDP was mildly elevated.  He was noted to have pulmonary hypertension.  He was referred for consideration for CABG.  Due to his significant comorbidities he was not felt to be a good candidate for CABG or PCI.  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  He underwent successful coil/thrombin embolization of the inferior aneurysm sac.   He is seen today with his son. Seems to be holding his own. He has experienced a couple of episodes of profound hypotension after dialysis. This has happened a couple of times in the past 4 months. Last Tuesday went to the ED. Given fluid. Troponin and Ecg not acute. He denies any chest pain or increased SOB. Weight has been stable. No edema or increased abdominal girth. No bleeding.        Past Medical History:  Diagnosis Date   A-fib ([place]) 09/18/2020   AAA (abdominal aortic aneurysm) ([place])    5.3cm , magd by vascular Dr Scot Dock ,   Anemia    " slightly"   Arthritis    knees   Ascites    Cancer ([place])    prostate   CHF (congestive heart failure) ([place])    HFrEF 08/2020   Chronic kidney disease    progressed to ESRD 08/2019   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[facility]    Coronary artery disease    on CT scan   Gout    H/O hypercholesterolemia    Heart murmur    History of falling    recent- see ct chest results of 04/25/2018   Hypertension    Hypothyroidism    Liver cirrhosis ([place])  Myocardial infarction [facility])    NSTEMI 04/25/21, treated medically   Prediabetes    pt denies    Renal artery stenosis ([facility])    Short-term memory loss    Thrombocytopenia ([place])    Wears glasses     Past Surgical History:  Procedure Laterality Date   ABDOMINAL AORTIC ENDOVASCULAR STENT GRAFT N/A 01/21/2019   Procedure: ABDOMINAL AORTIC ENDOVASCULAR STENT GRAFT WITH CO2;  Surgeon: Angelia Mould, MD;  Location: [place];  Service: Vascular;  Laterality: N/A;   ABDOMINAL AORTOGRAM W/LOWER EXTREMITY Bilateral 12/14/2018   Procedure: ABDOMINAL AORTOGRAM W/LOWER EXTREMITY;  Surgeon: Angelia Mould, MD;  Location: [place] [facility];  Service: Cardiovascular;  Laterality: Bilateral;   AV FISTULA PLACEMENT Left  03/12/2019   Procedure: LEFT ARTERIOVENOUS Arteriovenous FISTULA CREATION.;  Surgeon: Angelia Mould, MD;  Location: [facility];  Service: Vascular;  Laterality: Left;   CATARACT EXTRACTION W/ INTRAOCULAR LENS  IMPLANT, BILATERAL     CHOLECYSTECTOMY     ESOPHAGEAL BANDING N/A 08/11/2017   Procedure: ESOPHAGEAL BANDING;  Surgeon: Otis Brace, MD;  Location: [place];  Service: Gastroenterology;  Laterality: N/A;   ESOPHAGEAL BANDING N/A 10/26/2017   Procedure: ESOPHAGEAL BANDING;  Surgeon: Otis Brace, MD;  Location: [facility];  Service: Gastroenterology;  Laterality: N/A;   ESOPHAGEAL BANDING N/A 12/19/2017   Procedure: ESOPHAGEAL BANDING;  Surgeon: Otis Brace, MD;  Location: [facility];  Service: Gastroenterology;  Laterality: N/A;   ESOPHAGOGASTRODUODENOSCOPY (EGD) WITH PROPOFOL N/A 07/05/2017   Procedure: ESOPHAGOGASTRODUODENOSCOPY (EGD) WITH PROPOFOL;  Surgeon: Otis Brace, MD;  Location: [place];  Service: Gastroenterology;  Laterality: N/A;   ESOPHAGOGASTRODUODENOSCOPY (EGD) WITH PROPOFOL N/A 08/11/2017   Procedure: ESOPHAGOGASTRODUODENOSCOPY (EGD) WITH PROPOFOL;  Surgeon: Otis Brace, MD;  Location: [place];  Service: Gastroenterology;  Laterality: N/A;   ESOPHAGOGASTRODUODENOSCOPY (EGD) WITH PROPOFOL N/A 10/26/2017   Procedure: ESOPHAGOGASTRODUODENOSCOPY (EGD) WITH PROPOFOL;  Surgeon: Otis Brace, MD;  [facility];  Service: Gastroenterology;  Laterality: N/A;   ESOPHAGOGASTRODUODENOSCOPY (EGD) WITH PROPOFOL N/A 12/19/2017   Procedure: ESOPHAGOGASTRODUODENOSCOPY (EGD) WITH PROPOFOL;  Surgeon: Otis Brace, MD;  [facility];  Service: Gastroenterology;  Laterality: N/A;   ESOPHAGOGASTRODUODENOSCOPY (EGD) WITH PROPOFOL N/A 10/29/2018   Procedure: ESOPHAGOGASTRODUODENOSCOPY (EGD) WITH PROPOFOL;  Surgeon: Otis Brace, MD;  [facility];  Service: Gastroenterology;  Laterality: N/A;    ESOPHAGOGASTRODUODENOSCOPY (EGD) WITH PROPOFOL N/A 02/14/2020   Procedure: ESOPHAGOGASTRODUODENOSCOPY (EGD) WITH PROPOFOL;  Surgeon: Otis Brace, MD;  [facility];  Service: Gastroenterology;  Laterality: N/A;   EYE SURGERY     bilateral cataract removal with lens placement   HERNIA REPAIR     IR ANGIOGRAM PULMONARY BILATERAL SELECTIVE  12/03/2021   IR ANGIOGRAM SELECTIVE EACH ADDITIONAL VESSEL  12/03/2021   IR ANGIOGRAM VISCERAL SELECTIVE  12/03/2021   IR ANGIOGRAM VISCERAL SELECTIVE  12/03/2021   IR ANGIOGRAM VISCERAL SELECTIVE  12/03/2021   IR CT SPINE LTD  12/03/2021   IR PARACENTESIS  12/26/2018   IR PARACENTESIS  01/02/2019   IR PARACENTESIS  01/15/2019   IR PARACENTESIS  01/30/2019   IR PARACENTESIS  02/05/2019   IR PARACENTESIS  02/14/2019   IR PARACENTESIS  02/28/2019   IR PARACENTESIS  03/14/2019   IR PARACENTESIS  03/29/2019   IR PARACENTESIS  04/11/2019   IR PARACENTESIS  04/25/2019   IR PARACENTESIS  05/09/2019   IR PARACENTESIS  06/03/2019   IR PARACENTESIS  06/06/2019   IR PARACENTESIS  06/20/2019   IR PARACENTESIS  07/03/2019   IR PARACENTESIS  07/19/2019   IR PARACENTESIS  08/08/2019   IR PARACENTESIS  08/27/2019   IR PARACENTESIS  09/09/2019   IR PARACENTESIS  09/24/2019   IR PARACENTESIS  10/07/2019   IR PARACENTESIS  10/23/2019   IR PARACENTESIS  11/07/2019   IR PARACENTESIS  11/21/2019   IR PARACENTESIS  12/04/2019   IR PARACENTESIS  12/25/2019   IR PARACENTESIS  01/10/2020   IR PARACENTESIS  01/27/2020   IR PARACENTESIS  02/19/2020   IR PARACENTESIS  03/27/2020   IR RADIOLOGIST EVAL & MGMT  11/16/2021   IR TRANSCATH/EMBOLIZ  12/03/2021   IR US GUIDE VASC ACCESS RIGHT  12/03/2021   PROSTATECTOMY     RADIOLOGY WITH ANESTHESIA N/A 12/03/2021   Procedure: Type II Endoleak;  Surgeon: Corrie Mckusick, DO;  Location: [place];  Service: Anesthesiology;  Laterality: N/A;   RIGHT AND LEFT HEART CATH N/A 09/18/2020   Procedure: RIGHT AND LEFT HEART CATH;  Surgeon: Troy Sine,  MD;  Location: [place] [facility];  Service: Cardiovascular;  Laterality: N/A;   subtotal gastrectomy       Current Outpatient Medications  Medication Sig Dispense Refill   apixaban (ELIQUIS) 5 MG TABS tablet TAKE 1 TABLET(5 MG) BY MOUTH TWICE DAILY 60 tablet 5   atorvastatin (LIPITOR) 40 MG tablet Take 1 tablet (40 mg total) by mouth daily. (Patient taking differently: Take 40 mg by mouth at bedtime.) 30 tablet 1   calcium acetate (PHOSLO) 667 MG capsule Take 1 capsule (667 mg total) by mouth 3 (three) times daily with meals. (Patient taking differently: Take 1,334 mg by mouth 3 (three) times daily with meals.) 90 capsule 2   cetirizine (ZYRTEC) 10 MG tablet Take 10 mg by mouth daily as needed for allergies.     clopidogrel (PLAVIX) 75 MG tablet Take 1 tablet (75 mg total) by mouth daily. (Patient taking differently: Take 75 mg by mouth at bedtime.) 30 tablet 1   donepezil (ARICEPT) 5 MG tablet Take 5 mg by mouth at bedtime.     isosorbide mononitrate (IMDUR) 30 MG 24 hr tablet Take 1/2 tablet (15 mg total) by mouth daily. (Patient taking differently: Take 15 mg by mouth at bedtime.) 30 tablet 1   levothyroxine (SYNTHROID) 50 MCG tablet Take 1 tablet (50 mcg total) by mouth daily. 30 tablet 1   lidocaine (LIDODERM) 5 % Place 1 patch onto the skin daily. Remove & Discard patch within 12 hours or as directed by MD (Patient not taking: Reported on 11/30/2021) 30 patch 0   metoprolol succinate (TOPROL-XL) 25 MG 24 hr tablet Take 1 and 1/2 tablets (37.5 mg total) by mouth daily. 45 tablet 1   multivitamin (RENA-VIT) TABS tablet Take 1 tablet by mouth daily. (Patient taking differently: Take 1 tablet by mouth at bedtime.) 30 tablet 0   nitroGLYCERIN (NITROSTAT) 0.4 MG SL tablet Place 0.4 mg under the tongue every 5 (five) minutes as needed for chest pain.     ondansetron (ZOFRAN) 4 MG tablet Take 4 mg by mouth every 8 (eight) hours as needed for nausea or vomiting.     spironolactone (ALDACTONE) 25 MG  tablet Take 1/2 tablet (12.5 mg total) by mouth daily. (Patient taking differently: Take 25 mg by mouth See admin instructions. Barron Schmid, [place], Friday and Sunday in the morning) 30 tablet 0   traMADol (ULTRAM) 50 MG tablet Take 1 tablet (50 mg total) by mouth every 8 (eight) hours as needed for moderate pain. 8 tablet 0  triamcinolone cream (KENALOG) 0.1 % Apply 1 Application topically daily as needed.     No current facility-administered medications for this visit.    Allergies:   Cephalexin and Hydralazine    Social History:  The patient  reports that he has never smoked. He has never used smokeless tobacco. He reports that he does not currently use alcohol. He reports that he does not use drugs.   Family History:  The patient's family history includes Diabetes Mellitus II in his mother; Stroke in his mother.    ROS:  Please see the history of present illness.   Otherwise, review of systems are positive for none.   All other systems are reviewed and negative.    PHYSICAL EXAM: VS:  There were no vitals taken for this visit. , BMI There is no height or weight on file to calculate BMI. GEN: elderly, chronically ill appearing, in no acute distress HEENT: normal Neck: no JVD, carotid bruits, or masses Cardiac: RRR; soft SEM RUSB. no rubs, or gallops,no edema  Respiratory:  clear to auscultation bilaterally, normal work of breathing GI: soft, nontender, nondistended, + BS MS: no deformity or atrophy Skin: warm and dry, no rash Neuro:  Strength and sensation are intact Psych: euthymic mood, full affect   EKG:  EKG is not ordered today. The ekg ordered 09/07/21 demonstrates NSR, LAFB, LVH with repolarization abnormality. I have personally reviewed and interpreted this study.    Recent Labs: 12/03/2021: ALT 35; BUN 32; Creatinine, Ser 5.74; Hemoglobin 11.5; Platelets 146; Potassium 3.8; Sodium 137    Lipid Panel    Component Value Date/Time   CHOL 133 09/19/2020 0358   TRIG 103  09/19/2020 0358   HDL 38 (L) 09/19/2020 0358   CHOLHDL 3.5 09/19/2020 0358   VLDL 21 09/19/2020 0358   LDLCALC 74 09/19/2020 0358      Wt Readings from Last 3 Encounters:  12/03/21 160 lb 0.9 oz (72.6 kg)  11/04/21 169 lb (76.7 kg)  09/10/21 169 lb 3.2 oz (76.7 kg)      Other studies Reviewed: Additional studies/ records that were reviewed today include:   Echocardiogram 04/25/2021 IMPRESSIONS     1. Left ventricular ejection fraction, by estimation, is 25 to 30%. The  left ventricle has severely decreased function. The left ventricle  demonstrates global hypokinesis. There is mild left ventricular  hypertrophy of the basal-septal segment. Left  ventricular diastolic parameters are consistent with Grade II diastolic  dysfunction (pseudonormalization).   2. Right ventricular systolic function is normal. The right ventricular  size is normal. Tricuspid regurgitation signal is inadequate for assessing  PA pressure.   3. Left atrial size was severely dilated.   4. Right atrial size was mildly dilated.   5. The mitral valve is normal in structure. Mild mitral valve  regurgitation. No evidence of mitral stenosis.   6. The aortic valve is calcified. There is severe calcifcation of the  aortic valve. There is severe thickening of the aortic valve. Aortic valve  regurgitation is not visualized. Aortic valve sclerosis/calcification is  present, without any evidence of  aortic stenosis. Aortic valve area, by VTI measures 1.71 cm. Aortic valve  mean gradient measures 11.0 mmHg. Aortic valve Vmax measures 2.25 m/s.   7. Aortic dilatation noted. There is mild dilatation of the ascending  aorta, measuring 40 mm.   8. The inferior vena cava is normal in size with <50% respiratory  variability, suggesting right atrial pressure of 8 mmHg.   Cardiac catheterization  09/18/2020   Prox LAD to Mid LAD lesion is 85% stenosed.   Mid LAD lesion is 50% stenosed.   Dist LAD lesion is 20%  stenosed.   Prox Cx to Mid Cx lesion is 65% stenosed.   Prox RCA lesion is 95% stenosed.   Mid RCA lesion is 90% stenosed.   LV end diastolic pressure is mildly elevated.   Hemodynamic findings consistent with mild pulmonary hypertension.   There is severe coronary calcification involving the LAD and right coronary artery with mild calcification of the left circumflex vessel.   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   Mild right heart pressure elevation with mild pulmonary hypertension.   RECOMMENDATION: The patient has severe coronary calcification and three-vessel disease.  Echo Doppler has demonstrated reduced EF in the 25 to 30% range.  Recommend initial surgical consultation for consideration of possible CABG revascularization surgery.  However with the patient's significant comorbidities, if surgery is excluded, will need to review with colleagues regarding potential orbital atherectomy and intervention into the LAD and RCA.  We will consider reinstitution of low-dose heparin much later today and this patient with recent PAF and ulcerated plaque in the proximal RCA.   Diagnostic Dominance: Co-dominant Intervention   ASSESSMENT AND PLAN:  1.  CAD - known severe 3 vessel disease by cath in July 2022. Poor candidate for CABG or PCI. S/p NSTEMI in February 2023 managed medically. Fortunately he is not having any significant angina.  Continue Plavix for one year from Feb. Continue Toprol and Imdur.    2. Hyperlipidemia- Continue atorvastatin   3. Chronic systolic and diastolic CHF-no increased DOE or activity intolerance.  Weight  stable, euvolemic.  Echocardiogram 04/25/2021 showed an LVEF of 25-30% with G2 DD.  Noted with GDMT due to ESRD and hypotension. Continue Aldactone, metoprolol Fluid volume managed by HD   4. Paroxysmal atrial fibrillation-no recurrent symptoms Continue apixaban, metoprolol   5. Liver cirrhosis-euvolemic.  Has known gastric varices.  Reports compliance with medical therapy. Continue lactulose, Aldactone Follows with PCP   6. End-stage renal disease-compliant with HD. Follows with nephrology. He is experiencing some hypotension post dialysis. Recommend he hold aldactone and Imdur on his dialysis days. If BP low hold Toprol as well.      Current medicines are reviewed at length with the patient today.  The patient does not have concerns regarding medicines.  The following changes have been made:  see above.  Labs/ tests ordered today include:  No orders of the defined types were placed in this encounter.        Disposition:   FU with me or APP in 4 months  Signed, Valeree Leidy Martinique, MD  01/19/2022 5:07 PM    [facility] [address] [place], [place], [address] Phone [phone], Fax [phone]

## 2022-01-24 ENCOUNTER — Ambulatory Visit: Payer: Medicare Other | Admitting: Cardiology

## 2022-01-26 ENCOUNTER — Ambulatory Visit
Admission: RE | Admit: 2022-01-26 | Discharge: 2022-01-26 | Disposition: A | Discharge status: Home / Self Care | Payer: Medicare Other | Source: Ambulatory Visit | Attending: Student | Admitting: Student

## 2022-01-26 DIAGNOSIS — I9789 Other postprocedural complications and disorders of the circulatory system, not elsewhere classified: Secondary | ICD-10-CM

## 2022-01-26 NOTE — Progress Notes (Signed)
Chief Complaint: Follow up Endoleak repair  Referring Physician(s): Matthews,Kacie Sue-Ellen  VVS: Dr. Jamelle Haring PCP: Dr. Bea Graff, Tia Alert   History of Present Illness: Ricky Harris is a 81 y.o. male presenting today as a scheduled post-op visit to [place] [facility], SP type II endoleak repair  Today, Ricky Harris joins [place] virtually, with his son, Ricky Harris, on the phone.  We confirmed his identity with 2 personal identifiers.   Hx: Ricky Harris presented to the ED at [facility] 11/04/21 with complaint of right chest/upper abdominal pain.  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  He was discharged home with follow up .    He has history of MI, and he was not a candidate for revascularization.  He has no history of stroke.    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  Main body from the right (surgical exposure), 70m x 154mx 12cm.  Left side 1623m 13.5cm, and right extension was 6m10m9.5cm. CIA landing zone bilaterally.     Baseline diameter of the excluded aneurysm was ~5.5cm on CT 03/22/19.  The current diameter is estimated 7.1cm on CT 11/04/21.  Seems to be an IMA source.   Interval Hx:  We treated Ricky MearAspinall6/23 with MAC.  Angiogram was performed, with navigation of microcatheter through the IMA to the sac.  There was lack retrograde flow through the IMA during angio, so it is now uncertain the exact source.  The aortic and pelvic angio did not reveal a definite source.   We did navigate to the sac, with embolization of the sac with combo thrombin and coil embo. He was discharged after short recovery, with CELT closure.    Today he tells me that he is feeling fine.  He denies any abdominal pain or other new symptoms.  He denies any trouble with the access site.    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[facility] disease    on CT scan   Gout    H/O hypercholesterolemia    Heart murmur    History of falling    recent- see ct chest results of 04/25/2018   Hypertension    Hypothyroidism    Liver cirrhosis (HCC)[place] Myocardial infarction (HCC)[place] NSTEMI 04/25/21, treated medically   Prediabetes    pt denies    Renal artery stenosis (HCC)[place] Short-term memory loss    Thrombocytopenia (HCC)[place] Wears glasses     Past Surgical History:  Procedure Laterality Date   ABDOMINAL AORTIC ENDOVASCULAR STENT GRAFT N/A 01/21/2019   Procedure: ABDOMINAL AORTIC ENDOVASCULAR STENT GRAFT WITH CO2;  Surgeon: DickAngelia Mould;  [facility];  Laterality: N/A;   ABDOMINAL AORTOGRAM W/LOWER EXTREMITY Bilateral 12/14/2018   Procedure: ABDOMINAL AORTOGRAM W/LOWER EXTREMITY;  Surgeon: Angelia Mould, MD;  Location: [place] [facility];  Service: Cardiovascular;  Laterality: Bilateral;   AV FISTULA PLACEMENT Left 03/12/2019   Procedure: LEFT ARTERIOVENOUS Arteriovenous FISTULA CREATION.;  Surgeon: Angelia Mould, MD;  Location: [facility];  Service: Vascular;  Laterality: Left;   CATARACT EXTRACTION W/ INTRAOCULAR LENS  IMPLANT, BILATERAL     CHOLECYSTECTOMY     ESOPHAGEAL BANDING N/A 08/11/2017   Procedure: ESOPHAGEAL BANDING;  Surgeon: Otis Brace, MD;  Location: [place];  Service: Gastroenterology;  Laterality: N/A;   ESOPHAGEAL BANDING N/A 10/26/2017   Procedure: ESOPHAGEAL BANDING;  Surgeon: Otis Brace, MD;  [facility];  Service: Gastroenterology;  Laterality: N/A;   ESOPHAGEAL BANDING N/A 12/19/2017   Procedure:  ESOPHAGEAL BANDING;  Surgeon: Otis Brace, MD;  [facility]: [facility];  Service: Gastroenterology;  Laterality: N/A;   ESOPHAGOGASTRODUODENOSCOPY (EGD) WITH PROPOFOL N/A 07/05/2017   Procedure: ESOPHAGOGASTRODUODENOSCOPY (EGD) WITH PROPOFOL;  Surgeon: Otis Brace, MD;  Location: [place];  Service: Gastroenterology;  Laterality: N/A;   ESOPHAGOGASTRODUODENOSCOPY (EGD) WITH PROPOFOL N/A 08/11/2017   Procedure: ESOPHAGOGASTRODUODENOSCOPY (EGD) WITH PROPOFOL;  Surgeon: Otis Brace, MD;  Location: [place];  Service: Gastroenterology;  Laterality: N/A;   ESOPHAGOGASTRODUODENOSCOPY (EGD) WITH PROPOFOL N/A 10/26/2017   Procedure: ESOPHAGOGASTRODUODENOSCOPY (EGD) WITH PROPOFOL;  Surgeon: Otis Brace, MD;  [facility];  Service: Gastroenterology;  Laterality: N/A;   ESOPHAGOGASTRODUODENOSCOPY (EGD) WITH PROPOFOL N/A 12/19/2017   Procedure: ESOPHAGOGASTRODUODENOSCOPY (EGD) WITH PROPOFOL;  Surgeon: Otis Brace, MD;  [facility]: [facility];  Service: Gastroenterology;  Laterality: N/A;   ESOPHAGOGASTRODUODENOSCOPY (EGD) WITH PROPOFOL N/A 10/29/2018   Procedure: ESOPHAGOGASTRODUODENOSCOPY (EGD) WITH PROPOFOL;  Surgeon: Otis Brace, MD;  [facility];  Service: Gastroenterology;  Laterality: N/A;   ESOPHAGOGASTRODUODENOSCOPY (EGD) WITH PROPOFOL N/A 02/14/2020   Procedure: ESOPHAGOGASTRODUODENOSCOPY (EGD) WITH PROPOFOL;  Surgeon: Otis Brace, MD;  [facility];  Service: Gastroenterology;  Laterality: N/A;   EYE SURGERY     bilateral cataract removal with lens placement   HERNIA REPAIR     IR ANGIOGRAM PULMONARY BILATERAL SELECTIVE  12/03/2021   IR ANGIOGRAM SELECTIVE EACH ADDITIONAL VESSEL  12/03/2021   IR ANGIOGRAM VISCERAL SELECTIVE  12/03/2021   IR ANGIOGRAM VISCERAL SELECTIVE  12/03/2021   IR ANGIOGRAM VISCERAL SELECTIVE  12/03/2021   IR CT SPINE LTD  12/03/2021   IR PARACENTESIS  12/26/2018   IR PARACENTESIS  01/02/2019   IR  PARACENTESIS  01/15/2019   IR PARACENTESIS  01/30/2019   IR PARACENTESIS  02/05/2019   IR PARACENTESIS  02/14/2019   IR PARACENTESIS  02/28/2019   IR PARACENTESIS  03/14/2019   IR PARACENTESIS  03/29/2019   IR PARACENTESIS  04/11/2019   IR PARACENTESIS  04/25/2019   IR PARACENTESIS  05/09/2019   IR PARACENTESIS  06/03/2019   IR PARACENTESIS  06/06/2019   IR PARACENTESIS  06/20/2019   IR PARACENTESIS  07/03/2019   IR PARACENTESIS  07/19/2019   IR PARACENTESIS  08/08/2019   IR PARACENTESIS  08/27/2019   IR PARACENTESIS  09/09/2019   IR PARACENTESIS  09/24/2019   IR PARACENTESIS  10/07/2019   IR PARACENTESIS  10/23/2019   IR PARACENTESIS  11/07/2019   IR PARACENTESIS  11/21/2019   IR PARACENTESIS  12/04/2019   IR PARACENTESIS  12/25/2019   IR PARACENTESIS  01/10/2020   IR PARACENTESIS  01/27/2020   IR PARACENTESIS  02/19/2020   IR PARACENTESIS  03/27/2020   IR RADIOLOGIST EVAL & MGMT  11/16/2021   IR TRANSCATH/EMBOLIZ  12/03/2021   IR  US GUIDE VASC ACCESS RIGHT  12/03/2021   PROSTATECTOMY     RADIOLOGY WITH ANESTHESIA N/A 12/03/2021   Procedure: Type II Endoleak;  Surgeon: Corrie Mckusick, DO;  Location: [place];  Service: Anesthesiology;  Laterality: N/A;   RIGHT AND LEFT HEART CATH N/A 09/18/2020   Procedure: RIGHT AND LEFT HEART CATH;  Surgeon: Troy Sine, MD;  Location: [place] [facility];  Service: Cardiovascular;  Laterality: N/A;   subtotal gastrectomy      Allergies: Cephalexin and Hydralazine  Medications: Prior to Admission medications   Medication Sig Start Date End Date Taking? Authorizing Provider  apixaban (ELIQUIS) 5 MG TABS tablet TAKE 1 TABLET(5 MG) BY MOUTH TWICE DAILY 08/17/21   [place], Peter M, MD  atorvastatin (LIPITOR) 40 MG tablet Take 1 tablet (40 mg total) by mouth daily. Patient taking differently: Take 40 mg by mouth at bedtime. 04/28/21   Dessa Phi, DO  calcium acetate (PHOSLO) 667 MG capsule Take 1 capsule (667 mg total) by mouth 3 (three) times daily with  meals. Patient taking differently: Take 1,334 mg by mouth 3 (three) times daily with meals. 02/20/20   Rai, Vernelle Emerald, MD  cetirizine (ZYRTEC) 10 MG tablet Take 10 mg by mouth daily as needed for allergies.    [provider]  clopidogrel (PLAVIX) 75 MG tablet Take 1 tablet (75 mg total) by mouth daily. Patient taking differently: Take 75 mg by mouth at bedtime. 04/28/21   Dessa Phi, DO  donepezil (ARICEPT) 5 MG tablet Take 5 mg by mouth at bedtime. 11/27/21   [provider]  isosorbide mononitrate (IMDUR) 30 MG 24 hr tablet Take 1/2 tablet (15 mg total) by mouth daily. Patient taking differently: Take 15 mg by mouth at bedtime. 04/27/21   Dessa Phi, DO  levothyroxine (SYNTHROID) 50 MCG tablet Take 1 tablet (50 mcg total) by mouth daily. 04/27/21   Dessa Phi, DO  lidocaine (LIDODERM) 5 % Place 1 patch onto the skin daily. Remove & Discard patch within 12 hours or as directed by MD Patient not taking: Reported on 11/30/2021 11/04/21   Kommor, Debe Coder, MD  metoprolol succinate (TOPROL-XL) 25 MG 24 hr tablet Take 1 and 1/2 tablets (37.5 mg total) by mouth daily. 04/27/21 12/03/21  Dessa Phi, DO  multivitamin (RENA-VIT) TABS tablet Take 1 tablet by mouth daily. Patient taking differently: Take 1 tablet by mouth at bedtime. 09/27/19   Autry-Lott, Naaman Plummer, DO  nitroGLYCERIN (NITROSTAT) 0.4 MG SL tablet Place 0.4 mg under the tongue every 5 (five) minutes as needed for chest pain.    [provider]  ondansetron (ZOFRAN) 4 MG tablet Take 4 mg by mouth every 8 (eight) hours as needed for nausea or vomiting. 08/30/21   [provider]  spironolactone (ALDACTONE) 25 MG tablet Take 1/2 tablet (12.5 mg total) by mouth daily. Patient taking differently: Take 25 mg by mouth See admin instructions. Takes Mon, [place], Friday and Sunday in the morning 04/27/21   Dessa Phi, DO  traMADol (ULTRAM) 50 MG tablet Take 1 tablet (50 mg total) by mouth every 8 (eight) hours as  needed for moderate pain. 03/12/19   Rhyne, Hulen Shouts, PA-C  triamcinolone cream (KENALOG) 0.1 % Apply 1 Application topically daily as needed. 11/07/21   [provider]     Family History  Problem Relation Age of Onset   Diabetes Mellitus II Mother    Stroke Mother     Social History   Socioeconomic History   Marital status:  Divorced    Spouse name: Not on file   Number of children: Not on file   Years of education: Not on file   Highest education level: Not on file  Occupational History   Not on file  Tobacco Use   Smoking status: Never   Smokeless tobacco: Never  Vaping Use   Vaping Use: Never used  Substance and Sexual Activity   Alcohol use: Not Currently   Drug use: Never   Sexual activity: Not on file  Other Topics Concern   Not on file  Social History Narrative   Not on file   Social Determinants of Health   Financial Resource Strain: Not on file  Food Insecurity: Not on file  Transportation Needs: Not on file  Physical Activity: Not on file  Stress: Not on file  Social Connections: Not on file       Review of Systems  Review of Systems: A 12 point ROS discussed and pertinent positives are indicated in the HPI above.  All other systems are negative.  Advance Care Plan: The advanced care plan/surrogate decision maker was discussed at the time of visit and documented in the medical record.    Physical Exam No direct physical exam was performed (except for noted visual exam findings with Video Visits).    Vital Signs: There were no vitals taken for this visit.  Imaging: No results found.  Labs:  CBC: Recent Labs    09/07/21 2123 10/23/21 2255 11/04/21 1150 12/03/21 0601 12/03/21 0622  WBC 10.3 11.8* 10.5  --  10.7*  HGB 11.3* 12.1* 10.7* 11.9* 11.5*  HCT 34.7* 36.3* 33.0* 35.0* 34.3*  PLT 134* 193 124*  --  146*    COAGS: Recent Labs    04/25/21 1427 10/23/21 2255 11/04/21 1432  INR  --  1.3* 1.3*  APTT 33 31 33     BMP: Recent Labs    09/07/21 2123 10/23/21 2255 11/04/21 1150 12/03/21 0601 12/03/21 0622  NA 138 135 136 137 137  K 4.0 4.4 4.7 3.6 3.8  CL 99 95* 99 97* 96*  CO2 _0 --  25  GLUCOSE 92 111* 111* 116* 107*  BUN 41* 33* 69* 30* 32*  CALCIUM 8.5* 9.4 9.4  --  9.0  CREATININE 5.42* 4.79* 8.24* 6.10* 5.74*  GFRNONAA 10* 12* 6*  --  9*    LIVER FUNCTION TESTS: Recent Labs    10/23/21 2255 11/04/21 1150 12/03/21 0622  BILITOT 0.9 0.6 1.4*  AST 43* 42* 37  ALT 68* 44 35  ALKPHOS 298* 232* 257*  PROT 7.6 6.6 7.6  ALBUMIN 3.5 3.3* 3.7    TUMOR MARKERS: No results for input(s): "AFPTM", "CEA", "CA199", "CHROMGRNA" in the last 8760 hours.  Assessment and Plan:  Ricky Harris is 81 yo male with multiple CV risk factors, with prior EVAR for AAA and type II endoleak, now SP trans-arterial embolization 10/6.   He is doing fine in his post op course.    I emphasized now that we need to continue to observe him, given our knowledge of the natural history of EVAR/Type II endoleaks, and the recognized occasional need for multiple secondary treatments.  They understand.   For now, we will plan on a 6 month office visit with non-contrast CT.   Plan: - 6 month follow up office visit with non-contrast abdominal/pelvic CT to measure the excluded sac.  - continue current care  Electronically Signed: Corrie Mckusick 01/26/2022, 8:29 AM  I spent a total of    15 Minutes in remote  clinical consultation, greater than 50% of which was counseling/coordinating care for type II endoleak, status post embolization.    Visit type: Audio only (telephone). Audio (no video) only due to patient's lack of internet/smartphone capability. Alternative for in-person consultation at [facility], [address], [place], [place]. This visit type was conducted due to national recommendations for restrictions regarding the COVID-19 Pandemic (e.g. social distancing).  This format is felt to  be most appropriate for this patient at this time.  All issues noted in this document were discussed and addressed.

## 2022-02-14 ENCOUNTER — Other Ambulatory Visit: Payer: Self-pay | Admitting: Cardiology

## 2022-02-14 DIAGNOSIS — I480 Paroxysmal atrial fibrillation: Secondary | ICD-10-CM

## 2022-02-14 NOTE — Telephone Encounter (Signed)
Prescription refill request for Eliquis received. Indication:Afib  Last office visit: 09/10/21 ([place])  Scr: 5.74 (12/03/21)  Age: 81 Weight: 72.6kg  Appropriate dose. Per dosing criteria, dosage may need to be changed at next refill due to age and Scr. Refill sent to requested pharmacy.

## 2022-04-12 NOTE — Progress Notes (Signed)
Cardiology Office Note   Date:  04/15/2022   ID:  Ricky Harris, DOB June 01, 1940, MRN [id number]  PCP:  [name]., MD  Cardiologist:   [name], MD   Chief Complaint  Patient presents with   Congestive Heart Failure   Coronary Artery Disease      History of Present Illness: Ricky Harris is a 82 y.o. male who presents for follow up CAD. 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  He presented to the emergency department on 04/25/2021 with acute onset of central chest discomfort.  He reported his pain was an 8 out of 10.  He also describes a tightness in his chest.  He noted radiation to his left arm.  His EKG showed sinus tachycardia with incomplete right bundle branch block.  He was also felt to have 2 mm anterior lateral ST segment elevation with inferior lateral T wave inversion.  A repeat EKG showed sinus rhythm with a rate of 94 and PACs.  He was started on IV heparin.  Cardiology was consulted.  Medical management was recommended.  His IV heparin was continued for 48 hours and he was transitioned to Eliquis and Plavix.  Echocardiogram 04/25/2021 showed an EF of 25-30% with left ventricular global hypokinesis.  He was noted to have G2 DD, severely dilated left atria and mildly dilated right atrium.  Mild mitral valve regurgitation was noted. He was treated with Imdur and metoprolol and higher statin dose.    He previously underwent cardiac catheterization 09/18/2020.  During that time he was noted to have proximal LAD-mid LAD 85% stenosis, LAD 50% stenosis, distal LAD 20% stenosis, proximal circumflex-mid circumflex 65%, proximal RCA 95%, mid RCA 90% and his LV EDP was mildly elevated.  He was noted to have pulmonary hypertension.  He was referred for consideration for CABG.  Due to his significant comorbidities he was not felt to be a good  candidate for CABG or PCI.  In October he underwent a coiling procedure for an endoleak in his EVAR.  He is seen today with his son. He is doing OK. Tolerating dialysis well. Volume status is stable. He denies any chest pain. SOB is unchanged. No edema. No bleeding.      Past Medical History:  Diagnosis Date   A-fib ([place]) 09/18/2020   AAA (abdominal aortic aneurysm) ([place])    5.3cm , magd by vascular Dr Scot Dock ,   Anemia    " slightly"   Arthritis    knees   Ascites    Cancer ([place])    prostate   CHF (congestive heart failure) ([place])    HFrEF 08/2020   Chronic kidney disease    progressed to ESRD 08/2019   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[facility])    Coronary artery disease    on CT scan   Gout    H/O hypercholesterolemia    Heart murmur    History of falling    recent- see ct chest results of 04/25/2018   Hypertension    Hypothyroidism    Liver cirrhosis ([place])    Myocardial infarction ([place])    NSTEMI 04/25/21, treated medically   Prediabetes    pt denies    Renal artery stenosis ([facility])    Short-term memory loss    Thrombocytopenia ([place])    Wears glasses     Past Surgical History:  Procedure Laterality Date   ABDOMINAL AORTIC ENDOVASCULAR STENT GRAFT N/A 01/21/2019   Procedure: ABDOMINAL AORTIC ENDOVASCULAR STENT GRAFT WITH CO2;  Surgeon: Angelia Mould, MD;  Location: [place];  Service: Vascular;  Laterality: N/A;   ABDOMINAL AORTOGRAM W/LOWER EXTREMITY Bilateral 12/14/2018   Procedure: ABDOMINAL AORTOGRAM W/LOWER EXTREMITY;  Surgeon: Angelia Mould, MD;  Location: [place] [facility];  Service: Cardiovascular;  Laterality: Bilateral;   AV FISTULA PLACEMENT Left 03/12/2019   Procedure: LEFT ARTERIOVENOUS Arteriovenous FISTULA CREATION.;  Surgeon: Angelia Mould, MD;  Location: [facility] OR;  Service: Vascular;  Laterality: Left;   CATARACT EXTRACTION W/ INTRAOCULAR LENS  IMPLANT, BILATERAL     CHOLECYSTECTOMY     ESOPHAGEAL BANDING N/A 08/11/2017    Procedure: ESOPHAGEAL BANDING;  Surgeon: Otis Brace, MD;  Location: [place];  Service: Gastroenterology;  Laterality: N/A;   ESOPHAGEAL BANDING N/A 10/26/2017   Procedure: ESOPHAGEAL BANDING;  Surgeon: Otis Brace, MD;  Location: [facility];  Service: Gastroenterology;  Laterality: N/A;   ESOPHAGEAL BANDING N/A 12/19/2017   Procedure: ESOPHAGEAL BANDING;  Surgeon: Otis Brace, MD;  Location: [facility];  Service: Gastroenterology;  Laterality: N/A;   ESOPHAGOGASTRODUODENOSCOPY (EGD) WITH PROPOFOL N/A 07/05/2017   Procedure: ESOPHAGOGASTRODUODENOSCOPY (EGD) WITH PROPOFOL;  Surgeon: Otis Brace, MD;  Location: [place];  Service: Gastroenterology;  Laterality: N/A;   ESOPHAGOGASTRODUODENOSCOPY (EGD) WITH PROPOFOL N/A 08/11/2017   Procedure: ESOPHAGOGASTRODUODENOSCOPY (EGD) WITH PROPOFOL;  Surgeon: Otis Brace, MD;  Location: [place];  Service: Gastroenterology;  Laterality: N/A;   ESOPHAGOGASTRODUODENOSCOPY (EGD) WITH PROPOFOL N/A 10/26/2017   Procedure: ESOPHAGOGASTRODUODENOSCOPY (EGD) WITH PROPOFOL;  Surgeon: Otis Brace, MD;  [facility];  Service: Gastroenterology;  Laterality: N/A;   ESOPHAGOGASTRODUODENOSCOPY (EGD) WITH PROPOFOL N/A 12/19/2017   Procedure: ESOPHAGOGASTRODUODENOSCOPY (EGD) WITH PROPOFOL;  Surgeon: Otis Brace, MD;  [facility];  Service: Gastroenterology;  Laterality: N/A;   ESOPHAGOGASTRODUODENOSCOPY (EGD) WITH PROPOFOL N/A 10/29/2018   Procedure: ESOPHAGOGASTRODUODENOSCOPY (EGD) WITH PROPOFOL;  Surgeon: Otis Brace, MD;  [facility]: [facility];  Service: Gastroenterology;  Laterality: N/A;   ESOPHAGOGASTRODUODENOSCOPY (EGD) WITH PROPOFOL N/A 02/14/2020   Procedure: ESOPHAGOGASTRODUODENOSCOPY (EGD) WITH PROPOFOL;  Surgeon: Otis Brace, MD;  [facility]: [facility];  Service: Gastroenterology;  Laterality: N/A;   EYE SURGERY     bilateral cataract removal with lens placement   HERNIA REPAIR      IR ANGIOGRAM PULMONARY BILATERAL SELECTIVE  12/03/2021   IR ANGIOGRAM SELECTIVE EACH ADDITIONAL VESSEL  12/03/2021   IR ANGIOGRAM VISCERAL SELECTIVE  12/03/2021   IR ANGIOGRAM VISCERAL SELECTIVE  12/03/2021   IR ANGIOGRAM VISCERAL SELECTIVE  12/03/2021   IR CT SPINE LTD  12/03/2021   IR PARACENTESIS  12/26/2018   IR PARACENTESIS  01/02/2019   IR PARACENTESIS  01/15/2019   IR PARACENTESIS  01/30/2019   IR PARACENTESIS  02/05/2019   IR PARACENTESIS  02/14/2019   IR PARACENTESIS  02/28/2019   IR PARACENTESIS  03/14/2019   IR PARACENTESIS  03/29/2019   IR PARACENTESIS  04/11/2019   IR PARACENTESIS  04/25/2019   IR PARACENTESIS  05/09/2019   IR PARACENTESIS  06/03/2019   IR PARACENTESIS  06/06/2019   IR PARACENTESIS  06/20/2019   IR PARACENTESIS  07/03/2019   IR PARACENTESIS  07/19/2019   IR PARACENTESIS  08/08/2019   IR PARACENTESIS  08/27/2019   IR PARACENTESIS  09/09/2019   IR PARACENTESIS  09/24/2019   IR PARACENTESIS  10/07/2019   IR PARACENTESIS  10/23/2019   IR PARACENTESIS  11/07/2019   IR PARACENTESIS  11/21/2019  IR PARACENTESIS  12/04/2019   IR PARACENTESIS  12/25/2019   IR PARACENTESIS  01/10/2020   IR PARACENTESIS  01/27/2020   IR PARACENTESIS  02/19/2020   IR PARACENTESIS  03/27/2020   IR RADIOLOGIST EVAL & MGMT  11/16/2021   IR TRANSCATH/EMBOLIZ  12/03/2021   IR US GUIDE VASC ACCESS RIGHT  12/03/2021   PROSTATECTOMY     RADIOLOGY WITH ANESTHESIA N/A 12/03/2021   Procedure: Type II Endoleak;  Surgeon: Corrie Mckusick, DO;  Location: [place];  Service: Anesthesiology;  Laterality: N/A;   RIGHT AND LEFT HEART CATH N/A 09/18/2020   Procedure: RIGHT AND LEFT HEART CATH;  Surgeon: Troy Sine, MD;  Location: [place] [facility];  Service: Cardiovascular;  Laterality: N/A;   subtotal gastrectomy       Current Outpatient Medications  Medication Sig Dispense Refill   apixaban (ELIQUIS) 5 MG TABS tablet TAKE 1 TABLET(5 MG) BY MOUTH TWICE DAILY 60 tablet 3   atorvastatin (LIPITOR) 40 MG tablet Take 1  tablet (40 mg total) by mouth daily. (Patient taking differently: Take 40 mg by mouth at bedtime.) 30 tablet 1   calcium acetate (PHOSLO) 667 MG capsule Take 1 capsule (667 mg total) by mouth 3 (three) times daily with meals. (Patient taking differently: Take 1,334 mg by mouth 3 (three) times daily with meals.) 90 capsule 2   cetirizine (ZYRTEC) 10 MG tablet Take 10 mg by mouth daily as needed for allergies.     levothyroxine (SYNTHROID) 50 MCG tablet Take 1 tablet (50 mcg total) by mouth daily. 30 tablet 1   metoprolol succinate (TOPROL-XL) 25 MG 24 hr tablet Take 1 and 1/2 tablets (37.5 mg total) by mouth daily. 45 tablet 1   multivitamin (RENA-VIT) TABS tablet Take 1 tablet by mouth daily. (Patient taking differently: Take 1 tablet by mouth at bedtime.) 30 tablet 0   nitroGLYCERIN (NITROSTAT) 0.4 MG SL tablet Place 0.4 mg under the tongue every 5 (five) minutes as needed for chest pain.     spironolactone (ALDACTONE) 25 MG tablet Take 1/2 tablet (12.5 mg total) by mouth daily. (Patient taking differently: Take 25 mg by mouth See admin instructions. Barron Schmid, [place], Friday and Sunday in the morning) 30 tablet 0   traMADol (ULTRAM) 50 MG tablet Take 1 tablet (50 mg total) by mouth every 8 (eight) hours as needed for moderate pain. 8 tablet 0   triamcinolone cream (KENALOG) 0.1 % Apply 1 Application topically daily as needed.     donepezil (ARICEPT) 5 MG tablet Take 5 mg by mouth at bedtime. (Patient not taking: Reported on 04/15/2022)     isosorbide mononitrate (IMDUR) 30 MG 24 hr tablet Take 1/2 tablet (15 mg total) by mouth daily. 30 tablet 3   lidocaine (LIDODERM) 5 % Place 1 patch onto the skin daily. Remove & Discard patch within 12 hours or as directed by MD (Patient not taking: Reported on 04/15/2022) 30 patch 0   ondansetron (ZOFRAN) 4 MG tablet Take 4 mg by mouth every 8 (eight) hours as needed for nausea or vomiting. (Patient not taking: Reported on 04/15/2022)     No current  facility-administered medications for this visit.    Allergies:   Cephalexin and Hydralazine    Social History:  The patient  reports that he has never smoked. He has never used smokeless tobacco. He reports that he does not currently use alcohol. He reports that he does not use drugs.   Family History:  The patient's family  history includes Diabetes Mellitus II in his mother; Stroke in his mother.    ROS:  Please see the history of present illness.   Otherwise, review of systems are positive for none.   All other systems are reviewed and negative.    PHYSICAL EXAM: VS:  BP (!) 103/54   Pulse 78   Ht 6' (1.829 m)   Wt 185 lb 3.2 oz (84 kg)   SpO2 100%   BMI 25.12 kg/m  , BMI Body mass index is 25.12 kg/m. GEN: elderly, chronically ill appearing, in no acute distress HEENT: normal Neck: no JVD, carotid bruits, or masses Cardiac: RRR; soft SEM RUSB. no rubs, or gallops,no edema  Respiratory:  clear to auscultation bilaterally, normal work of breathing GI: soft, nontender, nondistended, + BS MS: no deformity or atrophy Skin: warm and dry, no rash Neuro:  Strength and sensation are intact Psych: euthymic mood, full affect   EKG:  EKG is not ordered today.     Recent Labs: 12/03/2021: ALT 35; BUN 32; Creatinine, Ser 5.74; Hemoglobin 11.5; Platelets 146; Potassium 3.8; Sodium 137    Lipid Panel    Component Value Date/Time   CHOL 133 09/19/2020 0358   TRIG 103 09/19/2020 0358   HDL 38 (L) 09/19/2020 0358   CHOLHDL 3.5 09/19/2020 0358   VLDL 21 09/19/2020 0358   LDLCALC 74 09/19/2020 0358   Dated 02/04/22: Hgb 11.2, plts 146K. Creatinine 5.45. alk phos 230, otherwise CMET normal. Cholesterol 118, triglycerides 118, HDL 42, LDL 65. TSH normal.  Wt Readings from Last 3 Encounters:  04/15/22 185 lb 3.2 oz (84 kg)  12/03/21 160 lb 0.9 oz (72.6 kg)  11/04/21 169 lb (76.7 kg)      Other studies Reviewed: Additional studies/ records that were reviewed today include:    Echocardiogram 04/25/2021 IMPRESSIONS     1. Left ventricular ejection fraction, by estimation, is 25 to 30%. The  left ventricle has severely decreased function. The left ventricle  demonstrates global hypokinesis. There is mild left ventricular  hypertrophy of the basal-septal segment. Left  ventricular diastolic parameters are consistent with Grade II diastolic  dysfunction (pseudonormalization).   2. Right ventricular systolic function is normal. The right ventricular  size is normal. Tricuspid regurgitation signal is inadequate for assessing  PA pressure.   3. Left atrial size was severely dilated.   4. Right atrial size was mildly dilated.   5. The mitral valve is normal in structure. Mild mitral valve  regurgitation. No evidence of mitral stenosis.   6. The aortic valve is calcified. There is severe calcifcation of the  aortic valve. There is severe thickening of the aortic valve. Aortic valve  regurgitation is not visualized. Aortic valve sclerosis/calcification is  present, without any evidence of  aortic stenosis. Aortic valve area, by VTI measures 1.71 cm. Aortic valve  mean gradient measures 11.0 mmHg. Aortic valve Vmax measures 2.25 m/s.   7. Aortic dilatation noted. There is mild dilatation of the ascending  aorta, measuring 40 mm.   8. The inferior vena cava is normal in size with <50% respiratory  variability, suggesting right atrial pressure of 8 mmHg.   Cardiac catheterization 09/18/2020   Prox LAD to Mid LAD lesion is 85% stenosed.   Mid LAD lesion is 50% stenosed.   Dist LAD lesion is 20% stenosed.   Prox Cx to Mid Cx lesion is 65% stenosed.   Prox RCA lesion is 95% stenosed.   Mid RCA lesion is 90% stenosed.  LV end diastolic pressure is mildly elevated.   Hemodynamic findings consistent with mild pulmonary hypertension.   There is severe coronary calcification involving the LAD and right coronary artery with mild calcification of the left circumflex  vessel.   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   Mild right heart pressure elevation with mild pulmonary hypertension.   RECOMMENDATION: The patient has severe coronary calcification and three-vessel disease.  Echo Doppler has demonstrated reduced EF in the 25 to 30% range.  Recommend initial surgical consultation for consideration of possible CABG revascularization surgery.  However with the patient's significant comorbidities, if surgery is excluded, will need to review with colleagues regarding potential orbital atherectomy and intervention into the LAD and RCA.  We will consider reinstitution of low-dose heparin much later today and this patient with recent PAF and ulcerated plaque in the proximal RCA.   Diagnostic Dominance: Co-dominant Intervention   ASSESSMENT AND PLAN:  1.  CAD - known severe 3 vessel disease by cath in July 2022. Poor candidate for CABG or PCI. S/p NSTEMI in February 2023 managed medically. Fortunately he is not having any significant angina.  OK to discontinue Plavix at this time since it has been a year since his NSTEMI. On eliquis Continue Toprol and Imdur.    2. Hyperlipidemia- Continue atorvastatin. LDL is at goal   3. Chronic systolic and diastolic CHF-no increased DOE or activity intolerance.  Weight stable, euvolemic.  Echocardiogram 04/25/2021 showed an LVEF of 25-30% with G2 DD.  Noted with GDMT due to ESRD and hypotension. Continue Aldactone, metoprolol Fluid volume managed by HD   4. Paroxysmal atrial fibrillation-no recurrent symptoms Continue apixaban, metoprolol   5. Liver  cirrhosis-euvolemic.  Has known gastric varices.  Reports compliance with medical therapy. Continue lactulose, Aldactone Follows with PCP No bleeding   6. End-stage renal disease-compliant with HD. Follows with nephrology.      Current medicines are reviewed at length with the patient today.  The patient does not have concerns regarding medicines.  The following changes have been made:  see above.  Labs/ tests ordered today include:  No orders of the defined types were placed in this encounter.        Disposition:   FU with me or APP in 6 months  Signed, Perla Echavarria Martin[place], MD  04/15/2022 9:22 AM    [facility] [address] [place], [place], [address] Phone [phone], Fax [phone]

## 2022-04-15 ENCOUNTER — Encounter: Payer: Self-pay | Admitting: Cardiology

## 2022-04-15 ENCOUNTER — Ambulatory Visit: Payer: Medicare Other | Attending: Cardiology | Admitting: Cardiology

## 2022-04-15 VITALS — BP 103/54 | HR 78 | Ht 72.0 in | Wt 185.2 lb

## 2022-04-15 DIAGNOSIS — Z992 Dependence on renal dialysis: Secondary | ICD-10-CM

## 2022-04-15 DIAGNOSIS — K7469 Other cirrhosis of liver: Secondary | ICD-10-CM

## 2022-04-15 DIAGNOSIS — I480 Paroxysmal atrial fibrillation: Secondary | ICD-10-CM | POA: Diagnosis not present

## 2022-04-15 DIAGNOSIS — N186 End stage renal disease: Secondary | ICD-10-CM | POA: Diagnosis not present

## 2022-04-15 DIAGNOSIS — I5042 Chronic combined systolic (congestive) and diastolic (congestive) heart failure: Secondary | ICD-10-CM

## 2022-04-15 DIAGNOSIS — I25118 Atherosclerotic heart disease of native coronary artery with other forms of angina pectoris: Secondary | ICD-10-CM | POA: Diagnosis not present

## 2022-04-15 DIAGNOSIS — E782 Mixed hyperlipidemia: Secondary | ICD-10-CM

## 2022-04-15 MED ORDER — ISOSORBIDE MONONITRATE ER 30 MG PO TB24: 15.0000 mg | ORAL_TABLET | Freq: Every day | ORAL | 3 refills | Status: DC

## 2022-04-15 NOTE — Addendum Note (Signed)
Addended by: Patria Mane A on: 04/15/2022 09:26 AM   Modules accepted: Orders

## 2022-04-15 NOTE — Patient Instructions (Signed)
Medication Instructions:  STOP Plavix  *If you need a refill on your cardiac medications before your next appointment, please call your pharmacy*  Follow-Up: At [facility], you and your health needs are our priority.  As part of our continuing mission to provide you with exceptional heart care, we have created designated Provider Care Teams.  These Care Teams include your primary Cardiologist (physician) and Advanced Practice Providers (APPs -  Physician Assistants and Nurse Practitioners) who all work together to provide you with the care you need, when you need it.  We recommend signing up for the patient portal called "MyChart".  Sign up information is provided on this After Visit Summary.  MyChart is used to connect with patients for Virtual Visits (Telemedicine).  Patients are able to view lab/test results, encounter notes, upcoming appointments, etc.  Non-urgent messages can be sent to your provider as well.   To learn more about what you can do with MyChart, go to [email or web address].    Your next appointment:   6 month(s)  Provider:   Peter Martinique, MD

## 2022-05-20 ENCOUNTER — Other Ambulatory Visit: Payer: Self-pay | Admitting: Cardiology

## 2022-05-20 DIAGNOSIS — I480 Paroxysmal atrial fibrillation: Secondary | ICD-10-CM

## 2022-05-20 NOTE — Telephone Encounter (Signed)
Prescription refill request for Eliquis received. Indication: afib  Last office visit: Martinique, 04/15/2022 Scr: 5.45, 02/04/2022 Age: 82 yo  Weight: 84 kg   Pt qualifies for dose change of Eliquis.

## 2022-05-20 NOTE — Telephone Encounter (Signed)
Called pt and LMOM.  

## 2022-05-31 ENCOUNTER — Telehealth: Payer: Self-pay | Admitting: Cardiology

## 2022-05-31 NOTE — Telephone Encounter (Signed)
Pt c/o medication issue:  1. Name of Medication: apixaban (ELIQUIS) 2.5 MG TABS tablet   2. How are you currently taking this medication (dosage and times per day)? Take 1 tablet (2.5 mg total) by mouth 2 (two) times daily.   3. Are you having a reaction (difficulty breathing--STAT)? No   4. What is your medication issue? Patient's daughter called and wanted to verify the dosage the patient is supposed to be taking

## 2022-05-31 NOTE — Telephone Encounter (Signed)
Left message to call back  

## 2022-05-31 NOTE — Telephone Encounter (Signed)
2.5mg  BID is the correct dose. He is 82 years old and scr is >1.5

## 2022-06-01 NOTE — Telephone Encounter (Signed)
Call to daughter LVM to call office

## 2022-06-02 NOTE — Telephone Encounter (Signed)
Spoke with daughter and confirmed dose is 2.5 mg BID.  She states that they just received a refill of the 5mg  and ask if he should finish that and then start the 2.5mg  dose.  I advised no to start 2.5mg  now.  He can soplit the pill, as even as possible. Take half in the morning and the other half in the evening.  She states understanding and will start the dose of 2.5mg  BID

## 2022-06-10 ENCOUNTER — Other Ambulatory Visit: Payer: Self-pay | Admitting: Interventional Radiology

## 2022-06-10 DIAGNOSIS — I9789 Other postprocedural complications and disorders of the circulatory system, not elsewhere classified: Secondary | ICD-10-CM

## 2022-06-24 ENCOUNTER — Encounter (HOSPITAL_COMMUNITY): Payer: Self-pay

## 2022-06-24 ENCOUNTER — Emergency Department (HOSPITAL_COMMUNITY): Payer: Medicare Other

## 2022-06-24 ENCOUNTER — Inpatient Hospital Stay (HOSPITAL_COMMUNITY)
Admission: EM | Admit: 2022-06-24 | Discharge: 2022-06-29 | DRG: 865 | Disposition: A | Discharge status: Home Health | Payer: Medicare Other | Attending: Internal Medicine | Admitting: Internal Medicine

## 2022-06-24 DIAGNOSIS — E785 Hyperlipidemia, unspecified: Secondary | ICD-10-CM | POA: Diagnosis present

## 2022-06-24 DIAGNOSIS — I252 Old myocardial infarction: Secondary | ICD-10-CM

## 2022-06-24 DIAGNOSIS — I10 Essential (primary) hypertension: Secondary | ICD-10-CM | POA: Diagnosis present

## 2022-06-24 DIAGNOSIS — N186 End stage renal disease: Secondary | ICD-10-CM | POA: Diagnosis not present

## 2022-06-24 DIAGNOSIS — I714 Abdominal aortic aneurysm, without rupture, unspecified: Secondary | ICD-10-CM | POA: Diagnosis present

## 2022-06-24 DIAGNOSIS — E7800 Pure hypercholesterolemia, unspecified: Secondary | ICD-10-CM | POA: Diagnosis not present

## 2022-06-24 DIAGNOSIS — B348 Other viral infections of unspecified site: Principal | ICD-10-CM | POA: Diagnosis present

## 2022-06-24 DIAGNOSIS — N2581 Secondary hyperparathyroidism of renal origin: Secondary | ICD-10-CM | POA: Diagnosis present

## 2022-06-24 DIAGNOSIS — I2510 Atherosclerotic heart disease of native coronary artery without angina pectoris: Secondary | ICD-10-CM | POA: Diagnosis not present

## 2022-06-24 DIAGNOSIS — K7460 Unspecified cirrhosis of liver: Secondary | ICD-10-CM | POA: Diagnosis not present

## 2022-06-24 DIAGNOSIS — Z79899 Other long term (current) drug therapy: Secondary | ICD-10-CM

## 2022-06-24 DIAGNOSIS — I951 Orthostatic hypotension: Secondary | ICD-10-CM | POA: Diagnosis not present

## 2022-06-24 DIAGNOSIS — I132 Hypertensive heart and chronic kidney disease with heart failure and with stage 5 chronic kidney disease, or end stage renal disease: Secondary | ICD-10-CM | POA: Diagnosis not present

## 2022-06-24 DIAGNOSIS — E039 Hypothyroidism, unspecified: Secondary | ICD-10-CM | POA: Diagnosis not present

## 2022-06-24 DIAGNOSIS — Z1152 Encounter for screening for COVID-19: Secondary | ICD-10-CM

## 2022-06-24 DIAGNOSIS — I5022 Chronic systolic (congestive) heart failure: Secondary | ICD-10-CM | POA: Diagnosis present

## 2022-06-24 DIAGNOSIS — Z7901 Long term (current) use of anticoagulants: Secondary | ICD-10-CM

## 2022-06-24 DIAGNOSIS — E876 Hypokalemia: Secondary | ICD-10-CM | POA: Diagnosis not present

## 2022-06-24 DIAGNOSIS — R4182 Altered mental status, unspecified: Secondary | ICD-10-CM | POA: Diagnosis not present

## 2022-06-24 DIAGNOSIS — I4891 Unspecified atrial fibrillation: Secondary | ICD-10-CM | POA: Diagnosis present

## 2022-06-24 DIAGNOSIS — M109 Gout, unspecified: Secondary | ICD-10-CM | POA: Diagnosis not present

## 2022-06-24 DIAGNOSIS — J690 Pneumonitis due to inhalation of food and vomit: Secondary | ICD-10-CM | POA: Diagnosis not present

## 2022-06-24 DIAGNOSIS — I480 Paroxysmal atrial fibrillation: Secondary | ICD-10-CM | POA: Diagnosis present

## 2022-06-24 DIAGNOSIS — G9340 Encephalopathy, unspecified: Principal | ICD-10-CM

## 2022-06-24 DIAGNOSIS — G9341 Metabolic encephalopathy: Secondary | ICD-10-CM | POA: Diagnosis not present

## 2022-06-24 DIAGNOSIS — Z66 Do not resuscitate: Secondary | ICD-10-CM | POA: Diagnosis present

## 2022-06-24 DIAGNOSIS — D696 Thrombocytopenia, unspecified: Secondary | ICD-10-CM | POA: Diagnosis present

## 2022-06-24 DIAGNOSIS — Z8546 Personal history of malignant neoplasm of prostate: Secondary | ICD-10-CM

## 2022-06-24 DIAGNOSIS — D631 Anemia in chronic kidney disease: Secondary | ICD-10-CM | POA: Diagnosis present

## 2022-06-24 DIAGNOSIS — Z881 Allergy status to other antibiotic agents status: Secondary | ICD-10-CM

## 2022-06-24 DIAGNOSIS — Z833 Family history of diabetes mellitus: Secondary | ICD-10-CM

## 2022-06-24 DIAGNOSIS — Z79890 Hormone replacement therapy (postmenopausal): Secondary | ICD-10-CM

## 2022-06-24 DIAGNOSIS — Z823 Family history of stroke: Secondary | ICD-10-CM

## 2022-06-24 DIAGNOSIS — Z992 Dependence on renal dialysis: Secondary | ICD-10-CM | POA: Diagnosis not present

## 2022-06-24 DIAGNOSIS — F0390 Unspecified dementia without behavioral disturbance: Secondary | ICD-10-CM | POA: Diagnosis not present

## 2022-06-24 DIAGNOSIS — D649 Anemia, unspecified: Secondary | ICD-10-CM | POA: Diagnosis present

## 2022-06-24 DIAGNOSIS — D731 Hypersplenism: Secondary | ICD-10-CM | POA: Diagnosis present

## 2022-06-24 LAB — I-STAT CHEM 8, ED
BUN: 23 mg/dL (ref 8–23)
Calcium, Ion: 0.9 mmol/L — ABNORMAL LOW (ref 1.15–1.40)
Chloride: 104 mmol/L (ref 98–111)
Creatinine, Ser: 5.8 mg/dL — ABNORMAL HIGH (ref 0.61–1.24)
Glucose, Bld: 84 mg/dL (ref 70–99)
HCT: 27 % — ABNORMAL LOW (ref 39.0–52.0)
Hemoglobin: 9.2 g/dL — ABNORMAL LOW (ref 13.0–17.0)
Potassium: 4.2 mmol/L (ref 3.5–5.1)
Sodium: 140 mmol/L (ref 135–145)
TCO2: 25 mmol/L (ref 22–32)

## 2022-06-24 LAB — RESPIRATORY PANEL BY PCR

## 2022-06-24 LAB — CBC
HCT: 33.3 % — ABNORMAL LOW (ref 39.0–52.0)
HCT: 32.4 % — ABNORMAL LOW (ref 39.0–52.0)
Hemoglobin: 10.6 g/dL — ABNORMAL LOW (ref 13.0–17.0)
Hemoglobin: 10.5 g/dL — ABNORMAL LOW (ref 13.0–17.0)
MCH: 33.9 pg (ref 26.0–34.0)
MCH: 34.2 pg — ABNORMAL HIGH (ref 26.0–34.0)
MCHC: 31.8 g/dL (ref 30.0–36.0)
MCHC: 32.4 g/dL (ref 30.0–36.0)
MCV: 106.4 fL — ABNORMAL HIGH (ref 80.0–100.0)
MCV: 105.5 fL — ABNORMAL HIGH (ref 80.0–100.0)
Platelets: 111 10*3/uL — ABNORMAL LOW (ref 150–400)
Platelets: 107 10*3/uL — ABNORMAL LOW (ref 150–400)
RBC: 3.13 MIL/uL — ABNORMAL LOW (ref 4.22–5.81)
RBC: 3.07 MIL/uL — ABNORMAL LOW (ref 4.22–5.81)
RDW: 15.9 % — ABNORMAL HIGH (ref 11.5–15.5)
RDW: 15.7 % — ABNORMAL HIGH (ref 11.5–15.5)
WBC: 7 10*3/uL (ref 4.0–10.5)
WBC: 7.3 10*3/uL (ref 4.0–10.5)
nRBC: 0 % (ref 0.0–0.2)
nRBC: 0 % (ref 0.0–0.2)

## 2022-06-24 LAB — COMPREHENSIVE METABOLIC PANEL
ALT: 18 U/L (ref 0–44)
AST: 27 U/L (ref 15–41)
Albumin: 2.3 g/dL — ABNORMAL LOW (ref 3.5–5.0)
Alkaline Phosphatase: 106 U/L (ref 38–126)
Anion gap: 9 (ref 5–15)
BUN: 19 mg/dL (ref 8–23)
CO2: 18 mmol/L — ABNORMAL LOW (ref 22–32)
Calcium: 5.7 mg/dL — CL (ref 8.9–10.3)
Chloride: 113 mmol/L — ABNORMAL HIGH (ref 98–111)
Creatinine, Ser: 4.43 mg/dL — ABNORMAL HIGH (ref 0.61–1.24)
GFR, Estimated: 13 mL/min — ABNORMAL LOW (ref >60)
Glucose, Bld: 82 mg/dL (ref 70–99)
Potassium: 3 mmol/L — ABNORMAL LOW (ref 3.5–5.1)
Sodium: 140 mmol/L (ref 135–145)
Total Bilirubin: 0.7 mg/dL (ref 0.3–1.2)
Total Protein: 4.6 g/dL — ABNORMAL LOW (ref 6.5–8.1)

## 2022-06-24 LAB — RESP PANEL BY RT-PCR (RSV, FLU A&B, COVID)  RVPGX2
Influenza A by PCR: NEGATIVE
Influenza B by PCR: NEGATIVE
Resp Syncytial Virus by PCR: NEGATIVE
SARS Coronavirus 2 by RT PCR: NEGATIVE

## 2022-06-24 LAB — LACTIC ACID, PLASMA
Lactic Acid, Venous: 2.4 mmol/L (ref 0.5–1.9)
Lactic Acid, Venous: 1.4 mmol/L (ref 0.5–1.9)

## 2022-06-24 LAB — CREATININE, SERUM
Creatinine, Ser: 8.18 mg/dL — ABNORMAL HIGH (ref 0.61–1.24)
GFR, Estimated: 6 mL/min — ABNORMAL LOW (ref >60)

## 2022-06-24 LAB — MAGNESIUM: Magnesium: 1.3 mg/dL — ABNORMAL LOW (ref 1.7–2.4)

## 2022-06-24 LAB — BRAIN NATRIURETIC PEPTIDE: B Natriuretic Peptide: 298.8 pg/mL — ABNORMAL HIGH (ref 0.0–100.0)

## 2022-06-24 LAB — HEPATITIS B SURFACE ANTIGEN: Hepatitis B Surface Ag: NONREACTIVE

## 2022-06-24 MED ORDER — ACETAMINOPHEN 650 MG RE SUPP: 650.0000 mg | Freq: Four times a day (QID) | RECTAL | Status: DC | PRN

## 2022-06-24 MED ORDER — CALCIUM CARBONATE ANTACID 1250 MG/5ML PO SUSP: 500.0000 mg | Freq: Four times a day (QID) | ORAL | Status: DC | PRN

## 2022-06-24 MED ORDER — ALBUTEROL SULFATE HFA 108 (90 BASE) MCG/ACT IN AERS
2.0000 | INHALATION_SPRAY | Freq: Four times a day (QID) | RESPIRATORY_TRACT | Status: DC
  Administered 2022-06-24: 2 via RESPIRATORY_TRACT
  Filled 2022-06-24: qty 6.7

## 2022-06-24 MED ORDER — DOCUSATE SODIUM 283 MG RE ENEM: 1.0000 | ENEMA | RECTAL | Status: DC | PRN

## 2022-06-24 MED ORDER — ONDANSETRON HCL 4 MG/2ML IJ SOLN: 4.0000 mg | Freq: Four times a day (QID) | INTRAMUSCULAR | Status: DC | PRN

## 2022-06-24 MED ORDER — HEPARIN SODIUM (PORCINE) 5000 UNIT/ML IJ SOLN
5000.0000 [IU] | Freq: Three times a day (TID) | INTRAMUSCULAR | Status: DC
  Administered 2022-06-24 – 2022-06-25 (×2): 5000 [IU] via SUBCUTANEOUS
  Filled 2022-06-24 (×2): qty 1

## 2022-06-24 MED ORDER — CALCIUM GLUCONATE-NACL 1-0.675 GM/50ML-% IV SOLN
1.0000 g | Freq: Once | INTRAVENOUS | Status: AC
  Administered 2022-06-24: 1000 mg via INTRAVENOUS
  Filled 2022-06-24: qty 50

## 2022-06-24 MED ORDER — MAGNESIUM SULFATE 2 GM/50ML IV SOLN
2.0000 g | Freq: Once | INTRAVENOUS | Status: AC
  Administered 2022-06-24: 2 g via INTRAVENOUS
  Filled 2022-06-24: qty 50

## 2022-06-24 MED ORDER — ACETAMINOPHEN 325 MG PO TABS: 650.0000 mg | ORAL_TABLET | Freq: Four times a day (QID) | ORAL | Status: DC | PRN

## 2022-06-24 MED ORDER — CAMPHOR-MENTHOL 0.5-0.5 % EX LOTN: 1.0000 | TOPICAL_LOTION | Freq: Three times a day (TID) | CUTANEOUS | Status: DC | PRN

## 2022-06-24 MED ORDER — NEPRO/CARBSTEADY PO LIQD: 237.0000 mL | Freq: Three times a day (TID) | ORAL | Status: DC | PRN

## 2022-06-24 MED ORDER — HYDROXYZINE HCL 25 MG PO TABS: 25.0000 mg | ORAL_TABLET | Freq: Three times a day (TID) | ORAL | Status: DC | PRN

## 2022-06-24 MED ORDER — ZOLPIDEM TARTRATE 5 MG PO TABS: 5.0000 mg | ORAL_TABLET | Freq: Every evening | ORAL | Status: DC | PRN

## 2022-06-24 MED ORDER — ALBUTEROL SULFATE (2.5 MG/3ML) 0.083% IN NEBU
2.5000 mg | INHALATION_SOLUTION | Freq: Four times a day (QID) | RESPIRATORY_TRACT | Status: DC | PRN
  Administered 2022-06-25 – 2022-06-26 (×3): 2.5 mg via RESPIRATORY_TRACT
  Filled 2022-06-24 (×3): qty 3

## 2022-06-24 MED ORDER — ONDANSETRON HCL 4 MG PO TABS: 4.0000 mg | ORAL_TABLET | Freq: Four times a day (QID) | ORAL | Status: DC | PRN

## 2022-06-24 MED ORDER — CHLORHEXIDINE GLUCONATE CLOTH 2 % EX PADS
6.0000 | MEDICATED_PAD | Freq: Every day | CUTANEOUS | Status: DC
  Administered 2022-06-25 – 2022-06-29 (×5): 6 via TOPICAL

## 2022-06-24 MED ORDER — DEXAMETHASONE SODIUM PHOSPHATE 10 MG/ML IJ SOLN
10.0000 mg | Freq: Once | INTRAMUSCULAR | Status: AC
  Administered 2022-06-24: 10 mg via INTRAVENOUS
  Filled 2022-06-24: qty 1

## 2022-06-24 MED ORDER — SORBITOL 70 % SOLN: 30.0000 mL | Status: DC | PRN

## 2022-06-24 MED ORDER — BENZONATATE 100 MG PO CAPS
100.0000 mg | ORAL_CAPSULE | Freq: Once | ORAL | Status: AC
  Administered 2022-06-24: 100 mg via ORAL
  Filled 2022-06-24: qty 1

## 2022-06-24 NOTE — ED Provider Notes (Signed)
[place] EMERGENCY DEPARTMENT AT [facility] Provider Note   CSN: [id number] Arrival date & time: 06/24/22  0844     History  Chief Complaint  Patient presents with   Weakness   Cough    Ricky Harris is a 82 y.o. male.  HPI Patient with multiple medical problems including end-stage renal disease, who completed dialysis yesterday presents with 2 days of weakness, dizziness, cough.  He arrives via EMS and history is obtained by those individuals as well as chart review and the patient himself. Patient states that he feels generally uncomfortable without focal pain.  He complains of ongoing cough, weakness, dizziness, but no true dyspnea, no vomiting. Seemingly no recent medication change, diet change, activity change. As above patient did complete dialysis yesterday.    Home Medications Prior to Admission medications   Medication Sig Start Date End Date Taking? Authorizing Provider  apixaban (ELIQUIS) 2.5 MG TABS tablet Take 1 tablet (2.5 mg total) by mouth 2 (two) times daily. 05/23/22   [place], Peter M, MD  atorvastatin (LIPITOR) 40 MG tablet Take 1 tablet (40 mg total) by mouth daily. Patient taking differently: Take 40 mg by mouth at bedtime. 04/28/21   Noralee Stain, DO  calcium acetate (PHOSLO) 667 MG capsule Take 1 capsule (667 mg total) by mouth 3 (three) times daily with meals. Patient taking differently: Take 1,334 mg by mouth 3 (three) times daily with meals. 02/20/20   Rai, Delene Ruffini, MD  cetirizine (ZYRTEC) 10 MG tablet Take 10 mg by mouth daily as needed for allergies.    [provider]  isosorbide mononitrate (IMDUR) 30 MG 24 hr tablet Take 1/2 tablet (15 mg total) by mouth daily. 04/15/22   [place], Peter M, MD  levothyroxine (SYNTHROID) 50 MCG tablet Take 1 tablet (50 mcg total) by mouth daily. 04/27/21   Noralee Stain, DO  metoprolol succinate (TOPROL-XL) 25 MG 24 hr tablet Take 1 and 1/2 tablets (37.5 mg total) by mouth daily. 04/27/21 04/15/22   Noralee Stain, DO  multivitamin (RENA-VIT) TABS tablet Take 1 tablet by mouth daily. Patient taking differently: Take 1 tablet by mouth at bedtime. 09/27/19   Autry-Lott, Randa Evens, DO  nitroGLYCERIN (NITROSTAT) 0.4 MG SL tablet Place 0.4 mg under the tongue every 5 (five) minutes as needed for chest pain.    [provider]  spironolactone (ALDACTONE) 25 MG tablet Take 1/2 tablet (12.5 mg total) by mouth daily. Patient taking differently: Take 25 mg by mouth See admin instructions. Takes Mon, [place], Friday and Sunday in the morning 04/27/21   Noralee Stain, DO  traMADol (ULTRAM) 50 MG tablet Take 1 tablet (50 mg total) by mouth every 8 (eight) hours as needed for moderate pain. 03/12/19   Rhyne, Ames Coupe, PA-C  triamcinolone cream (KENALOG) 0.1 % Apply 1 Application topically daily as needed. 11/07/21   [provider]      Allergies    Cephalexin and Hydralazine    Review of Systems   Review of Systems  All other systems reviewed and are negative.   Physical Exam Updated Vital Signs BP (!) 151/68   Pulse 75   Temp 98.9 F (37.2 C)   Resp 19   Ht 6' (1.829 m)   Wt 83.9 kg   SpO2 94%   BMI 25.09 kg/m  Physical Exam Vitals and nursing note reviewed.  Constitutional:      General: He is not in acute distress.    Appearance: He is well-developed. He is  ill-appearing. He is not toxic-appearing or diaphoretic.  HENT:     Head: Normocephalic and atraumatic.  Eyes:     Conjunctiva/sclera: Conjunctivae normal.  Cardiovascular:     Rate and Rhythm: Regular rhythm. Tachycardia present.  Pulmonary:     Effort: Pulmonary effort is normal. No respiratory distress.     Breath sounds: No stridor.     Comments: Ongoing cough makes auscultation difficult. Abdominal:     General: There is no distension.     Comments: Midline abdominal scar, nontender otherwise  Skin:    General: Skin is warm and dry.  Neurological:     Mental Status: He is alert and oriented to person,  place, and time.     ED Results / Procedures / Treatments   Labs (all labs ordered are listed, but only abnormal results are displayed) Labs Reviewed  COMPREHENSIVE METABOLIC PANEL - Abnormal; Notable for the following components:      Result Value   Potassium 3.0 (*)    Chloride 113 (*)    CO2 18 (*)    Creatinine, Ser 4.43 (*)    Calcium 5.7 (*)    Total Protein 4.6 (*)    Albumin 2.3 (*)    GFR, Estimated 13 (*)    All other components within normal limits  CBC - Abnormal; Notable for the following components:   RBC 3.13 (*)    Hemoglobin 10.6 (*)    HCT 33.3 (*)    MCV 106.4 (*)    RDW 15.9 (*)    Platelets 111 (*)    All other components within normal limits  MAGNESIUM - Abnormal; Notable for the following components:   Magnesium 1.3 (*)    All other components within normal limits  BRAIN NATRIURETIC PEPTIDE - Abnormal; Notable for the following components:   B Natriuretic Peptide 298.8 (*)    All other components within normal limits  LACTIC ACID, PLASMA - Abnormal; Notable for the following components:   Lactic Acid, Venous 2.4 (*)    All other components within normal limits  I-STAT CHEM 8, ED - Abnormal; Notable for the following components:   Creatinine, Ser 5.80 (*)    Calcium, Ion 0.90 (*)    Hemoglobin 9.2 (*)    HCT 27.0 (*)    All other components within normal limits  RESP PANEL BY RT-PCR (RSV, FLU A&B, COVID)  [id number]  LACTIC ACID, PLASMA    EKG EKG Interpretation  Date/Time:  Friday June 24 2022 08:54:00 EDT Ventricular Rate:  105 PR Interval:  161 QRS Duration: 122 QT Interval:  358 QTC Calculation: 474 R Axis:   -69 Text Interpretation: Sinus tachycardia IVCD, consider atypical RBBB Anterior infarct, old Nonspecific T abnormalities, lateral leads Abnormal ECG Confirmed by Gerhard Munch [phone]) on 06/24/2022 9:14:05 AM  Radiology CT Head Wo Contrast  Result Date: 06/24/2022 CLINICAL DATA:  Cough, weakness, dizziness EXAM: CT HEAD  WITHOUT CONTRAST TECHNIQUE: Contiguous axial images were obtained from the base of the skull through the vertex without intravenous contrast. RADIATION DOSE REDUCTION: This exam was performed according to the departmental dose-optimization program which includes automated exposure control, adjustment of the mA and/or kV according to patient size and/or use of iterative reconstruction technique. COMPARISON:  07/13/2021 FINDINGS: Brain: No evidence of acute infarction, hemorrhage, mass, mass effect, or midline shift. No hydrocephalus or extra-axial fluid collection. Periventricular white matter changes, likely the sequela of chronic small vessel ischemic disease. Vascular: No hyperdense vessel. Atherosclerotic calcifications in the intracranial carotid  and vertebral arteries. Skull: Negative for fracture or focal lesion. Sinuses/Orbits: Mild mucosal thickening in the ethmoid air cells. Status post bilateral lens replacements. Other: None. IMPRESSION: No acute intracranial process. Electronically Signed   By: Wiliam Ke M.D.   On: 06/24/2022 14:39   DG Chest Port 1 View  Result Date: 06/24/2022 CLINICAL DATA:  Shortness of breath EXAM: PORTABLE CHEST 1 VIEW COMPARISON:  X-ray 11/04/2021 FINDINGS: Calcified aorta. Normal cardiopericardial silhouette. Elevation of the left hemidiaphragm. Slight vascular congestion. No pneumothorax or effusion. Overlapping cardiac leads. Degenerative changes seen of the left shoulder IMPRESSION: Elevation of the left hemidiaphragm. Calcified aorta. Central vascular congestion and interstitial changes. Electronically Signed   By: Karen Kays M.D.   On: 06/24/2022 10:15    Procedures Procedures    Medications Ordered in ED Medications  albuterol (VENTOLIN HFA) 108 (90 Base) MCG/ACT inhaler 2 puff (2 puffs Inhalation Given 06/24/22 1558)  magnesium sulfate IVPB 2 g 50 mL (0 g Intravenous Stopped 06/24/22 1334)  benzonatate (TESSALON) capsule 100 mg (100 mg Oral Given 06/24/22  1558)  dexamethasone (DECADRON) injection 10 mg (10 mg Intravenous Given 06/24/22 1558)    ED Course/ Medical Decision Making/ A&P                             Medical Decision Making Adult male with multiple medical issues including end-stage renal disease, pretension, hyperlipidemia presents with weakness, dizziness, cough. Differential broad, including bronchitis, pneumonia, bacteremia, sepsis, fluid overload status, less likely intra-abdominal process with a soft, nonperitoneal abdomen. Patient's initial vitals consistent with dialysis patient with borderline hypotension.  No substantial tachycardia. Cardiac 95 sinus normal Pulse ox 96% room air normal   Amount and/or Complexity of Data Reviewed Independent Historian: EMS    Details: EMS rhythm strip with sinus rhythm, occasional premature beats, intraventricular conduction delay External Data Reviewed: notes. Labs: ordered. Decision-making details documented in ED Course. Radiology: ordered and independent interpretation performed. Decision-making details documented in ED Course. ECG/medicine tests: ordered and independent interpretation performed. Decision-making details documented in ED Course.  Risk Prescription drug management. Decision regarding hospitalization.  Update: Patient accompanied by his son.  Son notes that the patient's wife and he both noticed new confusion today, and with the patient's weakness, decreased interactivity, they sent him here for evaluation.  4:13 PM  She continues to have coughing spells.  Lactic acid level has improved, I have reviewed his labs, CT, findings with the patient's son. Earlier findings concerning for substantial electrolyte abnormalities with hypokalemia, hypomagnesemia, and addressed with magnesium repletion.  Given the patient's hypocalcemia, hypomagnesemia, fatigue and confusion there are some suspicion for changes secondary to dialysis/uremic encephalopathy.  Patient will be  admitted for further monitoring and management.         Final Clinical Impression(s) / ED Diagnoses Final diagnoses:  Encephalopathy acute  Hypomagnesemia  Hypocalcemia    Rx / DC Orders ED Discharge Orders     None         Gerhard Munch, MD 06/24/22 [phone]

## 2022-06-24 NOTE — ED Notes (Signed)
[facility] called for pt transport  

## 2022-06-24 NOTE — ED Triage Notes (Signed)
Pt c/o cough and gen weakness x 2 days. Pt c/o dizziness as well. Dialysis yesterday, full tx.  Pt c/o abd pain, soft.

## 2022-06-24 NOTE — ED Notes (Signed)
Lactic acid   2.4 Calcium  5.7  Report given to Dr Jeraldine Loots

## 2022-06-24 NOTE — Progress Notes (Signed)
Noted that patient is to be admitted for some weakness and confusion under OBS status.  He is a dialysis pt  HD TTS [place]-  3:45 EDW 83.5 BFR 350 AVF with 16 gauge needles 3K  no heparin Venofer 100 q 2 weeks Calcitriol 0.25 TIW  Did go to treatment Thurs-  left out at EDW  Will put in orders for dialysis tomorrow 4/27 on schedule-   Will do formal consult if pt converts to inpt status Also low calcium-  low albumin but ionized a little low-  dosed with IV calcium   Cecille Aver

## 2022-06-24 NOTE — H&P (Signed)
History and Physical    Patient: Ricky Harris DOB: 1941/02/27 DOA: 06/24/2022 DOS: the patient was seen and examined on 06/24/2022 PCP: Gordan Payment., MD  Patient coming from: {Point_of_Origin:[address]}  Chief Complaint:  Chief Complaint  Patient presents with   Weakness   Cough   HPI: Ricky Harris is a 82 y.o. 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[facility])    Coronary artery disease    on CT scan   Gout    H/O hypercholesterolemia    Heart murmur    History of falling    recent- see ct chest results of 04/25/2018   Hypertension    Hypothyroidism    Liver cirrhosis (HCC)    Myocardial infarction (HCC)    NSTEMI 04/25/21, treated medically   Prediabetes    pt denies    Renal artery stenosis (HCC)    Short-term memory loss    Thrombocytopenia (HCC)    Wears glasses    Past Surgical History:  Procedure Laterality Date   ABDOMINAL AORTIC ENDOVASCULAR STENT GRAFT N/A 01/21/2019   Procedure: ABDOMINAL AORTIC ENDOVASCULAR STENT GRAFT WITH CO2;  Surgeon: Chuck Hint, MD;  Location: [facility] OR;  Service: Vascular;  Laterality: N/A;   ABDOMINAL AORTOGRAM W/LOWER EXTREMITY Bilateral 12/14/2018   Procedure: ABDOMINAL AORTOGRAM W/LOWER EXTREMITY;  Surgeon: Chuck Hint, MD;  Location: [facility] INVASIVE CV LAB;  Service: Cardiovascular;  Laterality: Bilateral;   AV FISTULA PLACEMENT Left 03/12/2019   Procedure: LEFT ARTERIOVENOUS Arteriovenous FISTULA CREATION.;  Surgeon: Chuck Hint, MD;  Location: [facility] OR;  Service: Vascular;   Laterality: Left;   CATARACT EXTRACTION W/ INTRAOCULAR LENS  IMPLANT, BILATERAL     CHOLECYSTECTOMY     ESOPHAGEAL BANDING N/A 08/11/2017   Procedure: ESOPHAGEAL BANDING;  Surgeon: Kathi Der, MD;  Location: [facility] ENDOSCOPY;  Service: Gastroenterology;  Laterality: N/A;   ESOPHAGEAL BANDING N/A 10/26/2017   Procedure: ESOPHAGEAL BANDING;  Surgeon: Kathi Der, MD;  Location: [facility]COPY;  Service: Gastroenterology;  Laterality: N/A;   ESOPHAGEAL BANDING N/A 12/19/2017   Procedure: ESOPHAGEAL BANDING;  Surgeon: Kathi Der, MD;  Location: [facility];  Service: Gastroenterology;  Laterality: N/A;   ESOPHAGOGASTRODUODENOSCOPY (EGD) WITH PROPOFOL N/A 07/05/2017   Procedure: ESOPHAGOGASTRODUODENOSCOPY (EGD) WITH PROPOFOL;  Surgeon: Kathi Der, MD;  Location: [facility];  Service: Gastroenterology;  Laterality: N/A;   ESOPHAGOGASTRODUODENOSCOPY (EGD) WITH PROPOFOL N/A 08/11/2017   Procedure: ESOPHAGOGASTRODUODENOSCOPY (EGD) WITH PROPOFOL;  Surgeon: Kathi Der, MD;  Location: [facility];  Service: Gastroenterology;  Laterality: N/A;   ESOPHAGOGASTRODUODENOSCOPY (EGD) WITH PROPOFOL N/A 10/26/2017   Procedure: ESOPHAGOGASTRODUODENOSCOPY (EGD) WITH PROPOFOL;  Surgeon: Kathi Der, MD;  Location: [facility];  Service: Gastroenterology;  Laterality: N/A;   ESOPHAGOGASTRODUODENOSCOPY (EGD) WITH PROPOFOL N/A 12/19/2017   Procedure: ESOPHAGOGASTRODUODENOSCOPY (EGD) WITH PROPOFOL;  Surgeon: Kathi Der, MD;  Location: [facility];  Service: Gastroenterology;  Laterality: N/A;   ESOPHAGOGASTRODUODENOSCOPY (EGD) WITH PROPOFOL N/A 10/29/2018   Procedure: ESOPHAGOGASTRODUODENOSCOPY (EGD) WITH PROPOFOL;  Surgeon: Kathi Der, MD;  Location: [facility];  Service: Gastroenterology;  Laterality: N/A;   ESOPHAGOGASTRODUODENOSCOPY (EGD) WITH PROPOFOL N/A 02/14/2020   Procedure: ESOPHAGOGASTRODUODENOSCOPY (EGD) WITH PROPOFOL;  Surgeon: Kathi Der, MD;  Location:  [facility];  Service: Gastroenterology;  Laterality: N/A;   EYE SURGERY     bilateral cataract removal with lens placement   HERNIA REPAIR     IR ANGIOGRAM PULMONARY BILATERAL SELECTIVE  12/03/2021   IR ANGIOGRAM SELECTIVE EACH ADDITIONAL VESSEL  12/03/2021   IR ANGIOGRAM VISCERAL SELECTIVE  12/03/2021   IR ANGIOGRAM VISCERAL SELECTIVE  12/03/2021   IR ANGIOGRAM VISCERAL SELECTIVE  12/03/2021   IR CT SPINE LTD  12/03/2021   IR PARACENTESIS  12/26/2018   IR PARACENTESIS  01/02/2019   IR PARACENTESIS  01/15/2019   IR PARACENTESIS  01/30/2019   IR PARACENTESIS  02/05/2019   IR PARACENTESIS  02/14/2019   IR PARACENTESIS  02/28/2019   IR PARACENTESIS  03/14/2019   IR PARACENTESIS  03/29/2019   IR PARACENTESIS  04/11/2019   IR PARACENTESIS  04/25/2019   IR PARACENTESIS  05/09/2019   IR PARACENTESIS  06/03/2019   IR PARACENTESIS  06/06/2019   IR PARACENTESIS  06/20/2019   IR PARACENTESIS  07/03/2019   IR PARACENTESIS  07/19/2019   IR PARACENTESIS  08/08/2019   IR PARACENTESIS  08/27/2019   IR PARACENTESIS  09/09/2019   IR PARACENTESIS  09/24/2019   IR PARACENTESIS  10/07/2019   IR PARACENTESIS  10/23/2019   IR PARACENTESIS  11/07/2019   IR PARACENTESIS  11/21/2019   IR PARACENTESIS  12/04/2019   IR PARACENTESIS  12/25/2019   IR PARACENTESIS  01/10/2020   IR PARACENTESIS  01/27/2020   IR PARACENTESIS  02/19/2020   IR PARACENTESIS  03/27/2020   IR RADIOLOGIST EVAL & MGMT  11/16/2021   IR TRANSCATH/EMBOLIZ  12/03/2021   IR US GUIDE VASC ACCESS RIGHT  12/03/2021   PROSTATECTOMY     RADIOLOGY WITH ANESTHESIA N/A 12/03/2021   Procedure: Type II Endoleak;  Surgeon: Gilmer Mor, DO;  Location: [facility] OR;  Service: Anesthesiology;  Laterality: N/A;   RIGHT AND LEFT HEART CATH N/A 09/18/2020   Procedure: RIGHT AND LEFT HEART CATH;  Surgeon: Lennette Bihari, MD;  Location: [facility] INVASIVE CV LAB;  Service: Cardiovascular;  Laterality: N/A;   subtotal gastrectomy     Social History:  reports that he has never smoked. He has  never used smokeless tobacco. He reports that he does not currently use alcohol. He reports that he does not use drugs.  Allergies  Allergen Reactions   Cephalexin Hives   Hydralazine Hives    Family History  Problem Relation Age of Onset   Diabetes Mellitus II Mother    Stroke Mother     Prior to Admission medications   Medication Sig Start Date End Date Taking? Authorizing Provider  apixaban (ELIQUIS) 2.5 MG TABS tablet Take 1 tablet (2.5 mg total) by mouth 2 (two) times daily. 05/23/22   [place], Peter M, MD  atorvastatin (LIPITOR) 40 MG tablet Take 1 tablet (40 mg total) by mouth daily. Patient taking differently: Take 40 mg by mouth at bedtime. 04/28/21   Noralee Stain, DO  calcium acetate (PHOSLO) 667 MG capsule Take 1 capsule (667 mg total) by mouth 3 (three) times daily with meals. Patient taking differently: Take 1,334 mg by mouth 3 (three) times daily with meals. 02/20/20   Rai, Delene Ruffini, MD  cetirizine (ZYRTEC) 10 MG tablet Take 10 mg by mouth  daily as needed for allergies.    [provider]  isosorbide mononitrate (IMDUR) 30 MG 24 hr tablet Take 1/2 tablet (15 mg total) by mouth daily. 04/15/22   [place], Peter M, MD  levothyroxine (SYNTHROID) 50 MCG tablet Take 1 tablet (50 mcg total) by mouth daily. 04/27/21   Noralee Stain, DO  metoprolol succinate (TOPROL-XL) 25 MG 24 hr tablet Take 1 and 1/2 tablets (37.5 mg total) by mouth daily. 04/27/21 04/15/22  Noralee Stain, DO  multivitamin (RENA-VIT) TABS tablet Take 1 tablet by mouth daily. Patient taking differently: Take 1 tablet by mouth at bedtime. 09/27/19   Autry-Lott, Randa Evens, DO  nitroGLYCERIN (NITROSTAT) 0.4 MG SL tablet Place 0.4 mg under the tongue every 5 (five) minutes as needed for chest pain.    [provider]  spironolactone (ALDACTONE) 25 MG tablet Take 1/2 tablet (12.5 mg total) by mouth daily. Patient taking differently: Take 25 mg by mouth See admin instructions. Takes Mon, [place], Friday and  Sunday in the morning 04/27/21   Noralee Stain, DO  traMADol (ULTRAM) 50 MG tablet Take 1 tablet (50 mg total) by mouth every 8 (eight) hours as needed for moderate pain. 03/12/19   Rhyne, Ames Coupe, PA-C  triamcinolone cream (KENALOG) 0.1 % Apply 1 Application topically daily as needed. 11/07/21   [provider]    Physical Exam: Vitals:   06/24/22 1700 06/24/22 1730 06/24/22 1755 06/24/22 1800  BP: 95/66 (!) 111/56  (!) 122/43  Pulse: (!) 107 (!) 101 (!) 105 (!) 102  Resp: 19 18  19   Temp:  98.6 F (37 C)  98.4 F (36.9 C)  TempSrc:    Oral  SpO2: 92% 94% 93%   Weight:      Height:       *** Data Reviewed: {Tip this will not be part of the note when signed- Document your independent interpretation of telemetry tracing, EKG, lab, Radiology test or any other diagnostic tests. Add any new diagnostic test ordered today. (Optional):26781} {Results:26384}  Assessment and Plan: No notes have been filed under this hospital service. Service: Hospitalist     Advance Care Planning:   Code Status: DNR ***  Consults: ***  Family Communication: ***  Severity of Illness: {Observation/Inpatient:21159}  AuthorLonia Blood, MD 06/24/2022 6:54 PM  For on call review [email or web address].

## 2022-06-25 ENCOUNTER — Inpatient Hospital Stay (HOSPITAL_COMMUNITY): Payer: Medicare Other

## 2022-06-25 DIAGNOSIS — I714 Abdominal aortic aneurysm, without rupture, unspecified: Secondary | ICD-10-CM | POA: Diagnosis present

## 2022-06-25 DIAGNOSIS — D696 Thrombocytopenia, unspecified: Secondary | ICD-10-CM

## 2022-06-25 DIAGNOSIS — E876 Hypokalemia: Secondary | ICD-10-CM | POA: Diagnosis present

## 2022-06-25 DIAGNOSIS — R4182 Altered mental status, unspecified: Secondary | ICD-10-CM | POA: Diagnosis present

## 2022-06-25 DIAGNOSIS — I951 Orthostatic hypotension: Secondary | ICD-10-CM | POA: Diagnosis present

## 2022-06-25 DIAGNOSIS — I480 Paroxysmal atrial fibrillation: Secondary | ICD-10-CM | POA: Diagnosis present

## 2022-06-25 DIAGNOSIS — G9341 Metabolic encephalopathy: Secondary | ICD-10-CM | POA: Diagnosis present

## 2022-06-25 DIAGNOSIS — G9340 Encephalopathy, unspecified: Secondary | ICD-10-CM | POA: Diagnosis not present

## 2022-06-25 DIAGNOSIS — D631 Anemia in chronic kidney disease: Secondary | ICD-10-CM | POA: Diagnosis present

## 2022-06-25 DIAGNOSIS — M109 Gout, unspecified: Secondary | ICD-10-CM | POA: Diagnosis present

## 2022-06-25 DIAGNOSIS — K7469 Other cirrhosis of liver: Secondary | ICD-10-CM

## 2022-06-25 DIAGNOSIS — K7460 Unspecified cirrhosis of liver: Secondary | ICD-10-CM | POA: Diagnosis present

## 2022-06-25 DIAGNOSIS — I5022 Chronic systolic (congestive) heart failure: Secondary | ICD-10-CM | POA: Diagnosis present

## 2022-06-25 DIAGNOSIS — J189 Pneumonia, unspecified organism: Secondary | ICD-10-CM | POA: Diagnosis not present

## 2022-06-25 DIAGNOSIS — Z992 Dependence on renal dialysis: Secondary | ICD-10-CM | POA: Diagnosis not present

## 2022-06-25 DIAGNOSIS — E7800 Pure hypercholesterolemia, unspecified: Secondary | ICD-10-CM | POA: Diagnosis present

## 2022-06-25 DIAGNOSIS — B348 Other viral infections of unspecified site: Secondary | ICD-10-CM | POA: Diagnosis present

## 2022-06-25 DIAGNOSIS — I252 Old myocardial infarction: Secondary | ICD-10-CM | POA: Diagnosis not present

## 2022-06-25 DIAGNOSIS — N186 End stage renal disease: Secondary | ICD-10-CM | POA: Diagnosis present

## 2022-06-25 DIAGNOSIS — Z66 Do not resuscitate: Secondary | ICD-10-CM | POA: Diagnosis present

## 2022-06-25 DIAGNOSIS — E039 Hypothyroidism, unspecified: Secondary | ICD-10-CM | POA: Diagnosis present

## 2022-06-25 DIAGNOSIS — I132 Hypertensive heart and chronic kidney disease with heart failure and with stage 5 chronic kidney disease, or end stage renal disease: Secondary | ICD-10-CM | POA: Diagnosis present

## 2022-06-25 DIAGNOSIS — Z1152 Encounter for screening for COVID-19: Secondary | ICD-10-CM | POA: Diagnosis not present

## 2022-06-25 DIAGNOSIS — F0390 Unspecified dementia without behavioral disturbance: Secondary | ICD-10-CM | POA: Diagnosis present

## 2022-06-25 DIAGNOSIS — N2581 Secondary hyperparathyroidism of renal origin: Secondary | ICD-10-CM | POA: Diagnosis present

## 2022-06-25 DIAGNOSIS — I2510 Atherosclerotic heart disease of native coronary artery without angina pectoris: Secondary | ICD-10-CM | POA: Diagnosis present

## 2022-06-25 DIAGNOSIS — J690 Pneumonitis due to inhalation of food and vomit: Secondary | ICD-10-CM | POA: Diagnosis not present

## 2022-06-25 LAB — COMPREHENSIVE METABOLIC PANEL
ALT: 25 U/L (ref 0–44)
AST: 36 U/L (ref 15–41)
Albumin: 3 g/dL — ABNORMAL LOW (ref 3.5–5.0)
Alkaline Phosphatase: 149 U/L — ABNORMAL HIGH (ref 38–126)
Anion gap: 13 (ref 5–15)
BUN: 37 mg/dL — ABNORMAL HIGH (ref 8–23)
CO2: 24 mmol/L (ref 22–32)
Calcium: 8.6 mg/dL — ABNORMAL LOW (ref 8.9–10.3)
Chloride: 97 mmol/L — ABNORMAL LOW (ref 98–111)
Creatinine, Ser: 8.61 mg/dL — ABNORMAL HIGH (ref 0.61–1.24)
GFR, Estimated: 6 mL/min — ABNORMAL LOW (ref >60)
Glucose, Bld: 140 mg/dL — ABNORMAL HIGH (ref 70–99)
Potassium: 4.9 mmol/L (ref 3.5–5.1)
Sodium: 134 mmol/L — ABNORMAL LOW (ref 135–145)
Total Bilirubin: 1.4 mg/dL — ABNORMAL HIGH (ref 0.3–1.2)
Total Protein: 6.6 g/dL (ref 6.5–8.1)

## 2022-06-25 LAB — CBC
HCT: 32.6 % — ABNORMAL LOW (ref 39.0–52.0)
Hemoglobin: 10.5 g/dL — ABNORMAL LOW (ref 13.0–17.0)
MCH: 34.2 pg — ABNORMAL HIGH (ref 26.0–34.0)
MCHC: 32.2 g/dL (ref 30.0–36.0)
MCV: 106.2 fL — ABNORMAL HIGH (ref 80.0–100.0)
Platelets: 108 10*3/uL — ABNORMAL LOW (ref 150–400)
RBC: 3.07 MIL/uL — ABNORMAL LOW (ref 4.22–5.81)
RDW: 15.7 % — ABNORMAL HIGH (ref 11.5–15.5)
WBC: 7.4 10*3/uL (ref 4.0–10.5)
nRBC: 0 % (ref 0.0–0.2)

## 2022-06-25 LAB — AMMONIA: Ammonia: 17 umol/L (ref 9–35)

## 2022-06-25 LAB — HEPATITIS B SURFACE ANTIBODY, QUANTITATIVE: Hep B S AB Quant (Post): 10300 m[IU]/mL (ref >9.9)

## 2022-06-25 MED ORDER — ATORVASTATIN CALCIUM 40 MG PO TABS
40.0000 mg | ORAL_TABLET | Freq: Every day | ORAL | Status: DC
  Administered 2022-06-25 – 2022-06-29 (×5): 40 mg via ORAL
  Filled 2022-06-25 (×5): qty 1

## 2022-06-25 MED ORDER — ISOSORBIDE MONONITRATE ER 30 MG PO TB24
15.0000 mg | ORAL_TABLET | Freq: Every day | ORAL | Status: DC
  Administered 2022-06-25: 15 mg via ORAL
  Filled 2022-06-25: qty 1

## 2022-06-25 MED ORDER — ANTICOAGULANT SODIUM CITRATE 4% (200MG/5ML) IV SOLN: 5.0000 mL | Status: DC | PRN

## 2022-06-25 MED ORDER — RENA-VITE PO TABS
1.0000 | ORAL_TABLET | Freq: Every day | ORAL | Status: DC
  Administered 2022-06-25 – 2022-06-29 (×5): 1 via ORAL
  Filled 2022-06-25 (×5): qty 1

## 2022-06-25 MED ORDER — APIXABAN 2.5 MG PO TABS
2.5000 mg | ORAL_TABLET | Freq: Two times a day (BID) | ORAL | Status: DC
  Administered 2022-06-25 – 2022-06-29 (×9): 2.5 mg via ORAL
  Filled 2022-06-25 (×9): qty 1

## 2022-06-25 MED ORDER — LEVOTHYROXINE SODIUM 50 MCG PO TABS
50.0000 ug | ORAL_TABLET | Freq: Every day | ORAL | Status: DC
  Administered 2022-06-25 – 2022-06-29 (×5): 50 ug via ORAL
  Filled 2022-06-25 (×5): qty 1

## 2022-06-25 MED ORDER — MIDODRINE HCL 5 MG PO TABS
10.0000 mg | ORAL_TABLET | ORAL | Status: AC
  Administered 2022-06-25: 10 mg via ORAL
  Filled 2022-06-25: qty 2

## 2022-06-25 MED ORDER — METOPROLOL SUCCINATE ER 25 MG PO TB24
37.5000 mg | ORAL_TABLET | Freq: Every day | ORAL | Status: DC
  Administered 2022-06-25: 37.5 mg via ORAL
  Filled 2022-06-25: qty 2

## 2022-06-25 MED ORDER — HEPARIN SODIUM (PORCINE) 1000 UNIT/ML DIALYSIS: 1000.0000 [IU] | INTRAMUSCULAR | Status: DC | PRN

## 2022-06-25 MED ORDER — IPRATROPIUM-ALBUTEROL 0.5-2.5 (3) MG/3ML IN SOLN: 3.0000 mL | RESPIRATORY_TRACT | Status: AC

## 2022-06-25 MED ORDER — SODIUM CHLORIDE 0.9 % IV SOLN
1.5000 g | Freq: Two times a day (BID) | INTRAVENOUS | Status: DC
  Administered 2022-06-25 – 2022-06-28 (×7): 1.5 g via INTRAVENOUS
  Filled 2022-06-25 (×9): qty 4

## 2022-06-25 MED ORDER — LIDOCAINE HCL (PF) 1 % IJ SOLN: 5.0000 mL | INTRAMUSCULAR | Status: DC | PRN

## 2022-06-25 MED ORDER — METOPROLOL SUCCINATE ER 25 MG PO TB24
12.5000 mg | ORAL_TABLET | Freq: Every day | ORAL | Status: DC
  Administered 2022-06-27: 12.5 mg via ORAL
  Filled 2022-06-25: qty 1

## 2022-06-25 MED ORDER — ALBUMIN HUMAN 25 % IV SOLN
25.0000 g | Freq: Four times a day (QID) | INTRAVENOUS | Status: AC
  Administered 2022-06-25 – 2022-06-26 (×3): 25 g via INTRAVENOUS
  Filled 2022-06-25 (×3): qty 100

## 2022-06-25 MED ORDER — ALTEPLASE 2 MG IJ SOLR: 2.0000 mg | Freq: Once | INTRAMUSCULAR | Status: DC | PRN

## 2022-06-25 MED ORDER — BENZONATATE 100 MG PO CAPS
100.0000 mg | ORAL_CAPSULE | Freq: Three times a day (TID) | ORAL | Status: DC | PRN
  Administered 2022-06-25 – 2022-06-26 (×2): 100 mg via ORAL
  Filled 2022-06-25 (×3): qty 1

## 2022-06-25 MED ORDER — LACTATED RINGERS IV BOLUS
250.0000 mL | Freq: Once | INTRAVENOUS | Status: AC
  Administered 2022-06-25: 250 mL via INTRAVENOUS

## 2022-06-25 MED ORDER — PENTAFLUOROPROP-TETRAFLUOROETH EX AERO: 1.0000 | INHALATION_SPRAY | CUTANEOUS | Status: DC | PRN

## 2022-06-25 MED ORDER — LIDOCAINE-PRILOCAINE 2.5-2.5 % EX CREA: 1.0000 | TOPICAL_CREAM | CUTANEOUS | Status: DC | PRN

## 2022-06-25 MED ORDER — ISOSORBIDE MONONITRATE ER 30 MG PO TB24: 15.0000 mg | ORAL_TABLET | Freq: Every day | ORAL | Status: DC

## 2022-06-25 MED ORDER — DONEPEZIL HCL 5 MG PO TABS
5.0000 mg | ORAL_TABLET | Freq: Every day | ORAL | Status: DC
  Administered 2022-06-25 – 2022-06-28 (×4): 5 mg via ORAL
  Filled 2022-06-25 (×4): qty 1

## 2022-06-25 MED ORDER — MIDODRINE HCL 5 MG PO TABS
10.0000 mg | ORAL_TABLET | Freq: Once | ORAL | Status: AC
  Administered 2022-06-25: 10 mg via ORAL
  Filled 2022-06-25: qty 2

## 2022-06-25 NOTE — Plan of Care (Signed)
°  Problem: Education: °Goal: Knowledge of General Education information will improve °Description: Including pain rating scale, medication(s)/side effects and non-pharmacologic comfort measures °Outcome: Not Met (add Reason) °  °Problem: Health Behavior/Discharge Planning: °Goal: Ability to manage health-related needs will improve °Outcome: Not Met (add Reason) °  °Problem: Clinical Measurements: °Goal: Ability to maintain clinical measurements within normal limits will improve °Outcome: Not Met (add Reason) °Goal: Will remain free from infection °Outcome: Not Met (add Reason) °Goal: Diagnostic test results will improve °Outcome: Not Met (add Reason) °Goal: Respiratory complications will improve °Outcome: Not Met (add Reason) °Goal: Cardiovascular complication will be avoided °Outcome: Not Met (add Reason) °  °Problem: Activity: °Goal: Risk for activity intolerance will decrease °Outcome: Not Met (add Reason) °  °Problem: Nutrition: °Goal: Adequate nutrition will be maintained °Outcome: Not Met (add Reason) °  °Problem: Coping: °Goal: Level of anxiety will decrease °Outcome: Not Met (add Reason) °  °Problem: Elimination: °Goal: Will not experience complications related to bowel motility °Outcome: Not Met (add Reason) °Goal: Will not experience complications related to urinary retention °Outcome: Not Met (add Reason) °  °Problem: Pain Managment: °Goal: General experience of comfort will improve °Outcome: Not Met (add Reason) °  °Problem: Safety: °Goal: Ability to remain free from injury will improve °Outcome: Not Met (add Reason) °  °Problem: Skin Integrity: °Goal: Risk for impaired skin integrity will decrease °Outcome: Not Met (add Reason) °  °

## 2022-06-25 NOTE — Progress Notes (Signed)
The patient became hypotensive overnight after hemodialysis.  MAP in the low 60s.  Received a dose of midodrine 10 mg earlier.   Presented at bedside.  Personally reviewed chest x-ray done tonight and compared it with the previous chest x-ray done on 06/24/2022.  Increased right lower lobe infiltrates noted.  Due to concern for possible aspiration pneumonia we will start Unasyn.  Will maintain MAP greater than 65, added another dose of midodrine 10 mg x 1 and IV albumin 25 g x 3.  The patient is somnolent but easily arousable to voices and responds to questions appropriately.  Family members in the room, 2 sons, 1 daughter, and 1 daughter-in-law.  All questions answered to the best of my ability.  Will continue to closely monitor and treat as indicated.   Time: 15 minutes.  Charge note.

## 2022-06-25 NOTE — Progress Notes (Signed)
PT Cancellation Note  Patient Details Name: [name] MRN: [id number] DOB: 03/30/1940   Cancelled Treatment:    Reason Eval/Treat Not Completed: Patient at procedure or test/unavailable (HD). Will follow up for PT Evaluation as schedule permits.  Ina Homes, PT, DPT Acute Rehabilitation Services  Personal: Secure Chat Rehab Office: [phone]  Malachy Chamber 06/25/2022, 1:36 PM

## 2022-06-25 NOTE — Progress Notes (Signed)
Events noted-had a coughing spell post HD-became tachy and then was noted to be hypotensive. Around 2L of fluid removed with HD this evening. Per RN-O2 sat in the 90's on RA, Tachycardia improved and down to the low teens.Remains persistently hypotensive on second BP Check  Will hold all antihypertensives, give gentle 250 cc fluid bolus, and one dose of Midodrine 10 mg. Check CXR to ensure he did not aspirate during coughing spells.  Spoke with son at bedside, understands tenuous situation, and also understands that apart from supportive measures, patient would not benefit from any further escalation in care. Hope is that he will improve with the above outlined measures, if not we will need to consider hospice care.  I have spoken and signed out to Dr Luevenia Maxin covering MD as well, she will follow the CXR and evaluate patient.

## 2022-06-25 NOTE — Procedures (Signed)
HEMODIALYSIS TREATMENT NOTE:  3.5 hour treatment completed without issues. 2L net fluid removed. AVF patent and hemostasis achieved. Report given to primary nurse. Returned to room via transport.

## 2022-06-25 NOTE — Consult Note (Addendum)
[facility] Renal Consultation Note    Indication for Consultation:  Management of ESRD/hemodialysis; anemia, hypertension/volume and secondary hyperparathyroidism  ZOX:WRUEAV, [name]., MD  HPI: Ricky Harris is a 82 y.o. male. ESRD on HD TTS at [facility].  Past medical history significant for AAA s/p repair, HTN, aortic stenosis, A fib, CAD, endoleak w/enlarging aneurysm s/p repair/embolization, HLD, cirrhosis, Hx prostate cancer, gout, OA and renal artery stenosis.    Patient presented to the hospital yesterday d/t confusion.  Reported cough, weakness and dizziness starting after dialysis on Thursday.  Family believes he has worsened confusion.  Denies CP, SOB, abdominal pain, n/v/d, and perioral tingling. Has not missed any dialysis treatments.  Seen and examined at bedside.  Reports mostly non productive cough.  Feels about the same, no better no worse.  Majority of history obtained from chart review as patient has coughing spells every time he tries to answer a question.    Pertinent findings during work up include +Parainfluenza Virus 3 on viral panel, initial K 3.0, Ca 5.7, CXR with central vascular congestion and central venous changes and CT head with no acute findings.  Patient admitted to observation for evaluation and management.   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[facility])    Coronary artery disease    on CT scan   Gout    H/O hypercholesterolemia    Heart murmur    History of falling    recent- see ct chest results of 04/25/2018   Hypertension    Hypothyroidism    Liver cirrhosis (HCC)    Myocardial infarction  (HCC)    NSTEMI 04/25/21, treated medically   Prediabetes    pt denies    Renal artery stenosis (HCC)    Short-term memory loss    Thrombocytopenia (HCC)    Wears glasses    Past Surgical History:  Procedure Laterality Date   ABDOMINAL AORTIC ENDOVASCULAR STENT GRAFT N/A 01/21/2019   Procedure: ABDOMINAL AORTIC ENDOVASCULAR STENT GRAFT WITH CO2;  Surgeon: Chuck Hint, MD;  Location: [facility] OR;  Service: Vascular;  Laterality: N/A;   ABDOMINAL AORTOGRAM W/LOWER EXTREMITY Bilateral 12/14/2018   Procedure: ABDOMINAL AORTOGRAM W/LOWER EXTREMITY;  Surgeon: Chuck Hint, MD;  Location: [facility] INVASIVE CV LAB;  Service: Cardiovascular;  Laterality: Bilateral;   AV FISTULA PLACEMENT Left 03/12/2019   Procedure: LEFT ARTERIOVENOUS Arteriovenous FISTULA CREATION.;  Surgeon: Chuck Hint, MD;  Location: [facility] OR;  Service: Vascular;  Laterality: Left;   CATARACT EXTRACTION W/ INTRAOCULAR LENS  IMPLANT, BILATERAL     CHOLECYSTECTOMY     ESOPHAGEAL BANDING N/A 08/11/2017   Procedure: ESOPHAGEAL BANDING;  Surgeon: Kathi Der, MD;  Location: [facility] ENDOSCOPY;  Service: Gastroenterology;  Laterality: N/A;   ESOPHAGEAL BANDING N/A 10/26/2017   Procedure: ESOPHAGEAL BANDING;  Surgeon: Kathi Der, MD;  Location: [facility] ENDOSCOPY;  Service: Gastroenterology;  Laterality: N/A;   ESOPHAGEAL BANDING N/A 12/19/2017   Procedure: ESOPHAGEAL BANDING;  Surgeon: Kathi Der, MD;  Location: [facility]OSCOPY;  Service: Gastroenterology;  Laterality:  N/A;   ESOPHAGOGASTRODUODENOSCOPY (EGD) WITH PROPOFOL N/A 07/05/2017   Procedure: ESOPHAGOGASTRODUODENOSCOPY (EGD) WITH PROPOFOL;  Surgeon: Kathi Der, MD;  Location: [facility];  Service: Gastroenterology;  Laterality: N/A;   ESOPHAGOGASTRODUODENOSCOPY (EGD) WITH PROPOFOL N/A 08/11/2017   Procedure: ESOPHAGOGASTRODUODENOSCOPY (EGD) WITH PROPOFOL;  Surgeon: Kathi Der, MD;  Location: [facility];  Service: Gastroenterology;  Laterality:  N/A;   ESOPHAGOGASTRODUODENOSCOPY (EGD) WITH PROPOFOL N/A 10/26/2017   Procedure: ESOPHAGOGASTRODUODENOSCOPY (EGD) WITH PROPOFOL;  Surgeon: Kathi Der, MD;  Location: [facility];  Service: Gastroenterology;  Laterality: N/A;   ESOPHAGOGASTRODUODENOSCOPY (EGD) WITH PROPOFOL N/A 12/19/2017   Procedure: ESOPHAGOGASTRODUODENOSCOPY (EGD) WITH PROPOFOL;  Surgeon: Kathi Der, MD;  Location: [facility];  Service: Gastroenterology;  Laterality: N/A;   ESOPHAGOGASTRODUODENOSCOPY (EGD) WITH PROPOFOL N/A 10/29/2018   Procedure: ESOPHAGOGASTRODUODENOSCOPY (EGD) WITH PROPOFOL;  Surgeon: Kathi Der, MD;  Location: [facility];  Service: Gastroenterology;  Laterality: N/A;   ESOPHAGOGASTRODUODENOSCOPY (EGD) WITH PROPOFOL N/A 02/14/2020   Procedure: ESOPHAGOGASTRODUODENOSCOPY (EGD) WITH PROPOFOL;  Surgeon: Kathi Der, MD;  Location: [facility];  Service: Gastroenterology;  Laterality: N/A;   EYE SURGERY     bilateral cataract removal with lens placement   HERNIA REPAIR     IR ANGIOGRAM PULMONARY BILATERAL SELECTIVE  12/03/2021   IR ANGIOGRAM SELECTIVE EACH ADDITIONAL VESSEL  12/03/2021   IR ANGIOGRAM VISCERAL SELECTIVE  12/03/2021   IR ANGIOGRAM VISCERAL SELECTIVE  12/03/2021   IR ANGIOGRAM VISCERAL SELECTIVE  12/03/2021   IR CT SPINE LTD  12/03/2021   IR PARACENTESIS  12/26/2018   IR PARACENTESIS  01/02/2019   IR PARACENTESIS  01/15/2019   IR PARACENTESIS  01/30/2019   IR PARACENTESIS  02/05/2019   IR PARACENTESIS  02/14/2019   IR PARACENTESIS  02/28/2019   IR PARACENTESIS  03/14/2019   IR PARACENTESIS  03/29/2019   IR PARACENTESIS  04/11/2019   IR PARACENTESIS  04/25/2019   IR PARACENTESIS  05/09/2019   IR PARACENTESIS  06/03/2019   IR PARACENTESIS  06/06/2019   IR PARACENTESIS  06/20/2019   IR PARACENTESIS  07/03/2019   IR PARACENTESIS  07/19/2019   IR PARACENTESIS  08/08/2019   IR PARACENTESIS  08/27/2019   IR PARACENTESIS  09/09/2019   IR PARACENTESIS  09/24/2019   IR PARACENTESIS   10/07/2019   IR PARACENTESIS  10/23/2019   IR PARACENTESIS  11/07/2019   IR PARACENTESIS  11/21/2019   IR PARACENTESIS  12/04/2019   IR PARACENTESIS  12/25/2019   IR PARACENTESIS  01/10/2020   IR PARACENTESIS  01/27/2020   IR PARACENTESIS  02/19/2020   IR PARACENTESIS  03/27/2020   IR RADIOLOGIST EVAL & MGMT  11/16/2021   IR TRANSCATH/EMBOLIZ  12/03/2021   IR US GUIDE VASC ACCESS RIGHT  12/03/2021   PROSTATECTOMY     RADIOLOGY WITH ANESTHESIA N/A 12/03/2021   Procedure: Type II Endoleak;  Surgeon: Gilmer Mor, DO;  Location: [facility] OR;  Service: Anesthesiology;  Laterality: N/A;   RIGHT AND LEFT HEART CATH N/A 09/18/2020   Procedure: RIGHT AND LEFT HEART CATH;  Surgeon: Lennette Bihari, MD;  Location: [facility] INVASIVE CV LAB;  Service: Cardiovascular;  Laterality: N/A;   subtotal gastrectomy     Family History  Problem Relation Age of Onset   Diabetes Mellitus II Mother    Stroke Mother    Social History:  reports that he has never smoked. He has never used smokeless tobacco. He reports that he does not currently use alcohol. He reports that he does not use drugs. Allergies  Allergen Reactions  Cephalexin Hives   Hydralazine Hives   Prior to Admission medications   Medication Sig Start Date End Date Taking? Authorizing Provider  donepezil (ARICEPT) 5 MG tablet TAKE 1 TABLET(5 MG) BY MOUTH EVERY NIGHT 03/01/22  Yes [provider]  ondansetron (ZOFRAN) 4 MG tablet Take 4 mg by mouth every 8 (eight) hours as needed. 05/10/22  Yes [provider]  apixaban (ELIQUIS) 2.5 MG TABS tablet Take 1 tablet (2.5 mg total) by mouth 2 (two) times daily. 05/23/22   [place], Peter M, MD  atorvastatin (LIPITOR) 40 MG tablet Take 1 tablet (40 mg total) by mouth daily. Patient taking differently: Take 40 mg by mouth at bedtime. 04/28/21   Noralee Stain, DO  calcium acetate (PHOSLO) 667 MG capsule Take 1 capsule (667 mg total) by mouth 3 (three) times daily with meals. Patient taking differently: Take  1,334 mg by mouth 3 (three) times daily with meals. 02/20/20   Rai, Delene Ruffini, MD  cetirizine (ZYRTEC) 10 MG tablet Take 10 mg by mouth daily as needed for allergies.    [provider]  isosorbide mononitrate (IMDUR) 30 MG 24 hr tablet Take 1/2 tablet (15 mg total) by mouth daily. 04/15/22   [place], Peter M, MD  levothyroxine (SYNTHROID) 50 MCG tablet Take 1 tablet (50 mcg total) by mouth daily. 04/27/21   Noralee Stain, DO  metoprolol succinate (TOPROL-XL) 25 MG 24 hr tablet Take 1 and 1/2 tablets (37.5 mg total) by mouth daily. 04/27/21 04/15/22  Noralee Stain, DO  multivitamin (RENA-VIT) TABS tablet Take 1 tablet by mouth daily. Patient taking differently: Take 1 tablet by mouth at bedtime. 09/27/19   Autry-Lott, Randa Evens, DO  nitroGLYCERIN (NITROSTAT) 0.4 MG SL tablet Place 0.4 mg under the tongue every 5 (five) minutes as needed for chest pain.    [provider]  spironolactone (ALDACTONE) 25 MG tablet Take 1/2 tablet (12.5 mg total) by mouth daily. Patient taking differently: Take 25 mg by mouth See admin instructions. Takes Mon, [place], Friday and Sunday in the morning 04/27/21   Noralee Stain, DO  traMADol (ULTRAM) 50 MG tablet Take 1 tablet (50 mg total) by mouth every 8 (eight) hours as needed for moderate pain. 03/12/19   Rhyne, Ames Coupe, PA-C  triamcinolone cream (KENALOG) 0.1 % Apply 1 Application topically daily as needed. 11/07/21   [provider]   Current Facility-Administered Medications  Medication Dose Route Frequency Provider Last Rate Last Admin   acetaminophen (TYLENOL) tablet 650 mg  650 mg Oral Q6H PRN Rometta Emery, MD       Or   acetaminophen (TYLENOL) suppository 650 mg  650 mg Rectal Q6H PRN Earlie Lou L, MD       albuterol (PROVENTIL) (2.5 MG/3ML) 0.083% nebulizer solution 2.5 mg  2.5 mg Inhalation Q6H PRN Drema Dallas, MD   2.5 mg at 06/25/22 0353   alteplase (CATHFLO ACTIVASE) injection 2 mg  2 mg Intracatheter Once PRN  Annie Sable, MD       anticoagulant sodium citrate solution 5 mL  5 mL Intracatheter PRN Annie Sable, MD       apixaban Everlene Balls) tablet 2.5 mg  2.5 mg Oral BID Maretta Bees, MD   2.5 mg at 06/25/22 0945   atorvastatin (LIPITOR) tablet 40 mg  40 mg Oral Daily Maretta Bees, MD   40 mg at 06/25/22 0944   benzonatate (TESSALON) capsule 100 mg  100 mg Oral TID PRN Maretta Bees, MD  calcium carbonate (dosed in mg elemental calcium) suspension 500 mg of elemental calcium  500 mg of elemental calcium Oral Q6H PRN Rometta Emery, MD       camphor-menthol (SARNA) lotion 1 Application  1 Application Topical Q8H PRN Rometta Emery, MD       And   hydrOXYzine (ATARAX) tablet 25 mg  25 mg Oral Q8H PRN Rometta Emery, MD       Chlorhexidine Gluconate Cloth 2 % PADS 6 each  6 each Topical Q0600 Annie Sable, MD   6 each at 06/25/22 0500   docusate sodium (ENEMEEZ) enema 283 mg  1 enema Rectal PRN Rometta Emery, MD       donepezil (ARICEPT) tablet 5 mg  5 mg Oral QHS Ghimire, Werner Lean, MD       feeding supplement (NEPRO CARB STEADY) liquid 237 mL  237 mL Oral TID PRN Rometta Emery, MD       heparin injection 1,000 Units  1,000 Units Intracatheter PRN Annie Sable, MD       isosorbide mononitrate (IMDUR) 24 hr tablet 15 mg  15 mg Oral Daily Maretta Bees, MD   15 mg at 06/25/22 0944   levothyroxine (SYNTHROID) tablet 50 mcg  50 mcg Oral Daily Maretta Bees, MD   50 mcg at 06/25/22 0944   lidocaine (PF) (XYLOCAINE) 1 % injection 5 mL  5 mL Intradermal PRN Annie Sable, MD       lidocaine-prilocaine (EMLA) cream 1 Application  1 Application Topical PRN Annie Sable, MD       metoprolol succinate (TOPROL-XL) 24 hr tablet 37.5 mg  37.5 mg Oral Daily Maretta Bees, MD   37.5 mg at 06/25/22 0944   multivitamin (RENA-VIT) tablet 1 tablet  1 tablet Oral Daily Maretta Bees, MD   1 tablet at 06/25/22 0945    ondansetron (ZOFRAN) tablet 4 mg  4 mg Oral Q6H PRN Rometta Emery, MD       Or   ondansetron (ZOFRAN) injection 4 mg  4 mg Intravenous Q6H PRN Rometta Emery, MD       pentafluoroprop-tetrafluoroeth (GEBAUERS) aerosol 1 Application  1 Application Topical PRN Annie Sable, MD       sorbitol 70 % solution 30 mL  30 mL Oral PRN Rometta Emery, MD       zolpidem (AMBIEN) tablet 5 mg  5 mg Oral QHS PRN Rometta Emery, MD       Labs: Basic Metabolic Panel: Recent Labs  Lab 06/24/22 0852 06/24/22 0954 06/24/22 2141 06/25/22 0309  NA 140 140  --  134*  K 3.0* 4.2  --  4.9  CL 113* 104  --  97*  CO2 18*  --   --  24  GLUCOSE 82 84  --  140*  BUN 19 23  --  37*  CREATININE 4.43* 5.80* 8.18* 8.61*  CALCIUM 5.7*  --   --  8.6*   Liver Function Tests: Recent Labs  Lab 06/24/22 0852 06/25/22 0309  AST 27 36  ALT 18 25  ALKPHOS 106 149*  BILITOT 0.7 1.4*  PROT 4.6* 6.6  ALBUMIN 2.3* 3.0*   Recent Labs  Lab 06/25/22 0718  AMMONIA 17   CBC: Recent Labs  Lab 06/24/22 0852 06/24/22 0954 06/24/22 2141 06/25/22 0309  WBC 7.0  --  7.3 7.4  HGB 10.6* 9.2* 10.5* 10.5*  HCT 33.3* 27.0* 32.4* 32.6*  MCV 106.4*  --  105.5* 106.2*  PLT 111*  --  107* 108*    Studies/Results: CT Head Wo Contrast  Result Date: 06/24/2022 CLINICAL DATA:  Cough, weakness, dizziness EXAM: CT HEAD WITHOUT CONTRAST TECHNIQUE: Contiguous axial images were obtained from the base of the skull through the vertex without intravenous contrast. RADIATION DOSE REDUCTION: This exam was performed according to the departmental dose-optimization program which includes automated exposure control, adjustment of the mA and/or kV according to patient size and/or use of iterative reconstruction technique. COMPARISON:  07/13/2021 FINDINGS: Brain: No evidence of acute infarction, hemorrhage, mass, mass effect, or midline shift. No hydrocephalus or extra-axial fluid collection. Periventricular white matter  changes, likely the sequela of chronic small vessel ischemic disease. Vascular: No hyperdense vessel. Atherosclerotic calcifications in the intracranial carotid and vertebral arteries. Skull: Negative for fracture or focal lesion. Sinuses/Orbits: Mild mucosal thickening in the ethmoid air cells. Status post bilateral lens replacements. Other: None. IMPRESSION: No acute intracranial process. Electronically Signed   By: Wiliam Ke M.D.   On: 06/24/2022 14:39   DG Chest Port 1 View  Result Date: 06/24/2022 CLINICAL DATA:  Shortness of breath EXAM: PORTABLE CHEST 1 VIEW COMPARISON:  X-ray 11/04/2021 FINDINGS: Calcified aorta. Normal cardiopericardial silhouette. Elevation of the left hemidiaphragm. Slight vascular congestion. No pneumothorax or effusion. Overlapping cardiac leads. Degenerative changes seen of the left shoulder IMPRESSION: Elevation of the left hemidiaphragm. Calcified aorta. Central vascular congestion and interstitial changes. Electronically Signed   By: Karen Kays M.D.   On: 06/24/2022 10:15    ROS: All others negative except those listed in HPI.  Physical Exam: Vitals:   06/25/22 1214 06/25/22 1215 06/25/22 1230 06/25/22 1300  BP:  126/63 (!) 124/59 130/64  Pulse:  93 75 100  Resp:  17 18 (!) 24  Temp:  99.8 F (37.7 C)    TempSrc:  Axillary    SpO2:  96% 97% 99%  Weight: 81.3 kg     Height:         General: WDWN male in NAD Head: NCAT sclera not icteric MMM Neck: Supple. No JVD.  Lungs: +rhonchi w/scattered wheezing. Breathing is unlabored on RA +coughing Heart: +tachycardia. No murmur, rubs or gallops.  Abdomen: soft, nontender, +BS, no guarding, no rebound tenderness Lower extremities:no edema, ischemic changes, or open wounds  Neuro: Alert. Moves all extremities spontaneously. Psych:  Responds to questions appropriately with a normal affect. Dialysis Access: LU AVF +b/t  Dialysis Orders:  TTS - [facility]  3.75hrs, BFR 350, DFR 500,  EDW 83.5kg, 3K/  2.5Ca  Access: LU AVF  Heparin none Mircera 50 mcg q2wks - last 4/25 Venofer 100mg  qHD x10 Calcitriol 0.14mcg PO qHD  Assessment/Plan:  Acute metabolic encephalopathy - w/ cognitive dysfunction at baseline. likely 2/2 Parainfluenza Viral infection.  Parainfluenza - per PMD.  Fatigue/weakness/deconditioning - likely 2/2 #2.  Hypocalcemia - Ca 5.7 on admit, improved to 8.6 w/calcium gluconate - this is close to baseline. Continue to follow.   ESRD -  On HD TTS.  Plan for HD today per regular schedule.  Hypertension/volume  - BP well controlled. On toprol-Xl 37.5mg  qd and Imdur 15mg  qd.   Anemia of CKD - Hgb 10.5. No indication for ESA at this time.   Secondary Hyperparathyroidism -  Ca as noted above. Check phos. Continue VDRA. Not on binders.   Nutrition - Renal diet w/fluid restrictions.   Virgina Norfolk, PA-C [facility] 06/25/2022, 1:17 PM    Seen and examined independently.  Agree with note and  exam as documented above by Virgina Norfolk, PA and as noted here.  Ricky Harris is an elderly gentleman with a history of ESRD on HD TTS at [facility] who presented to the hospital due to increased confusion from baseline.  He does have a memory impairment and is on donepezil however it is unclear of what his baseline is.  Per nursing he is forgetful.  Nursing reports was oriented x 4 earlier for her but isn't able to my questions - he seems to fluctuate.     General elderly male in bed in no acute distress HEENT normocephalic atraumatic extraocular movements intact sclera anicteric Neck supple trachea midline Lungs clear to auscultation but reduced bilaterally; has a wet sounding cough Heart S1S2 no rub Abdomen soft nontender nondistended Extremities no edema appreciated; no cyanosis or clubbing  Psych normal mood and affect Neuro - awake on arrival; he tells me his name but does not provide the year or location (doesn't give an answer on two attempts) Access  LUE AVF bruit and thrill   # Acute encephalopathy  - background of at least some memory impairment though I do not see a diagnosis specified in the chart   # Parainfluenza - Per primary team   # Deconditioning - Per primary team   # ESRD  - HD per TTS schedule - had HD earlier today  # HTN  - optimize volume with HD esp in light of new viral URI  - acceptable control - BP a bit low at times on a regimen that may be directed at his heart  # Anemia of CKD  - No indication for ESA today - outpatient orders as above and recently dosed on 4/25  # Hypocalcemia and secondary hyperparathyroidism  - calcium repleted - on activated vitamin D   Disposition per primary team.  Thank you for the consult.  Please do not hesitate to contact me with any questions regarding our patient   Estanislado Emms, MD 06/25/2022 5:20 PM

## 2022-06-25 NOTE — Progress Notes (Addendum)
PROGRESS NOTE        PATIENT DETAILS Name: Ricky Harris Age: 82 y.o. Sex: male Date of Birth: [birth year] Admit Date: 06/24/2022 Admitting Physician Dewayne Shorter Levora Dredge, MD YHC:[id number], [name]., MD  Brief Summary: Patient is a 82 y.o.  male with history of ESRD on HD TTS-who presented with cough/weakness and encephalopathy.  See below for further details.  Significant events: 4/26>> cough/weakness/encephalopathy-admit to [facility].  Significant studies: 4/26>> CT head: No acute intracranial abnormality. 4/26>> CXR: No PNA.  Central vascular congestion.  Significant microbiology data: 4/26>> respiratory virus panel: Parainfluenza virus 3 4/26>> COVID/RSV/influenza PCR: Negative  Procedures: None  Consults: Nephrology  Subjective: Continues to cough-appears weak-some confusion persists-per family-not yet at baseline.  Objective: Vitals: Blood pressure (!) 134/59, pulse 83, temperature 98.3 F (36.8 C), temperature source Oral, resp. rate (!) 23, height 6' (1.829 m), weight 81.1 kg, SpO2 93 %.   Exam: Gen Exam:not in any distress HEENT:atraumatic, normocephalic Chest: B/L clear to auscultation anteriorly CVS:S1S2 regular Abdomen:soft non tender, non distended Extremities:no edema Neurology: Non focal Skin: no rash  Pertinent Labs/Radiology:    Latest Ref Rng & Units 06/25/2022    3:09 AM 06/24/2022    9:41 PM 06/24/2022    9:54 AM  CBC  WBC 4.0 - 10.5 K/uL 7.4  7.3    Hemoglobin 13.0 - 17.0 g/dL 83.1  51.7  9.2   Hematocrit 39.0 - 52.0 % 32.6  32.4  27.0   Platelets 150 - 400 K/uL 108  107      Lab Results  Component Value Date   NA 134 (L) 06/25/2022   K 4.9 06/25/2022   CL 97 (L) 06/25/2022   CO2 24 06/25/2022      Assessment/Plan: Acute metabolic encephalopathy Likely due to parainfluenza virus infection-superimposed on some probable cognitive dysfunction/dementia.  Apparently has a history of liver cirrhosis but ammonia levels  are stable. Although mentation improved-not yet at baseline per patient's son Continue supportive care  Fatigue/debility/deconditioning Family reports he is much weaker than his baseline-likely due to acute viral/parainfluenza infection PT/OT eval to determine safe disposition-as very frail and elderly at baseline.  Parainfluenza infection Coughing-frail/weak/mildly confused but otherwise appears stable Chest x-ray without any obvious infiltrates Repeat CXR tomorrow morning As needed antitussive's  PAF Telemetry monitoring Metoprolol/Eliquis  CAD-known triple-vessel disease by [facility] 2022 Managed medically-not a candidate for CABG or PCI per last cardiology note No anginal symptoms currently On Eliquis/Toprol/Imdur-no longer on Plavix per last cardiology note.  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 Delirium precautions  Hypothyroidism Levothyroxine  BMI: Estimated body mass index is 24.25 kg/m as calculated from the following:   Height as of this encounter: 6' (1.829 m).   Weight as of this encounter: 81.1 kg.   Code status:   Code Status: DNR   DVT Prophylaxis: apixaban (ELIQUIS) tablet 2.5 mg Start: 06/25/22 1000 apixaban (ELIQUIS) tablet 2.5 mg  Family Communication: Son at bedside   Disposition Plan: Status is: Observation The patient will require care spanning > 2 midnights and should be moved to inpatient because: Severity of  illness   Planned Discharge Destination:Home health   Diet: Diet Order             Diet renal with fluid restriction Fluid restriction: 1200 mL Fluid; Room service appropriate? Yes; Fluid consistency: Thin  Diet effective now                     Antimicrobial agents: Anti-infectives (From admission, onward)    None        MEDICATIONS: Scheduled Meds:  apixaban  2.5 mg Oral BID   atorvastatin  40 mg Oral Daily   Chlorhexidine Gluconate Cloth  6 each Topical Q0600   donepezil  5 mg Oral QHS   isosorbide mononitrate  15 mg Oral Daily   levothyroxine  50 mcg Oral Daily   metoprolol succinate  37.5 mg Oral Daily   multivitamin  1 tablet Oral Daily   Continuous Infusions: PRN Meds:.acetaminophen **OR** acetaminophen, albuterol, calcium carbonate (dosed in mg elemental calcium), camphor-menthol **AND** hydrOXYzine, docusate sodium, feeding supplement (NEPRO CARB STEADY), ondansetron **OR** ondansetron (ZOFRAN) IV, sorbitol, zolpidem   I have personally reviewed following labs and imaging studies  LABORATORY DATA: CBC: Recent Labs  Lab 06/24/22 0852 06/24/22 0954 06/24/22 2141 06/25/22 0309  WBC 7.0  --  7.3 7.4  HGB 10.6* 9.2* 10.5* 10.5*  HCT 33.3* 27.0* 32.4* 32.6*  MCV 106.4*  --  105.5* 106.2*  PLT 111*  --  107* 108*    Basic Metabolic Panel: Recent Labs  Lab 06/24/22 0852 06/24/22 0954 06/24/22 2141 06/25/22 0309  NA 140 140  --  134*  K 3.0* 4.2  --  4.9  CL 113* 104  --  97*  CO2 18*  --   --  24  GLUCOSE 82 84  --  140*  BUN 19 23  --  37*  CREATININE 4.43* 5.80* 8.18* 8.61*  CALCIUM 5.7*  --   --  8.6*  MG 1.3*  --   --   --     GFR: Estimated Creatinine Clearance: 7.5 mL/min (A) (by C-G formula based on SCr of 8.61 mg/dL (H)).  Liver Function Tests: Recent Labs  Lab 06/24/22 0852 06/25/22 0309  AST 27 36  ALT 18 25  ALKPHOS 106 149*  BILITOT 0.7 1.4*  PROT 4.6* 6.6  ALBUMIN 2.3* 3.0*   No results for input(s): "LIPASE",  "AMYLASE" in the last 168 hours. Recent Labs  Lab 06/25/22 0718  AMMONIA 17    Coagulation Profile: No results for input(s): "INR", "PROTIME" in the last 168 hours.  Cardiac Enzymes: No results for input(s): "CKTOTAL", "CKMB", "CKMBINDEX", "TROPONINI" in the last 168 hours.  BNP (last 3 results) No results for input(s): "PROBNP" in the last 8760 hours.  Lipid Profile: No results for input(s): "CHOL", "HDL", "LDLCALC", "TRIG", "CHOLHDL", "LDLDIRECT" in the last 72 hours.  Thyroid Function Tests: No results for input(s): "TSH", "T4TOTAL", "FREET4", "T3FREE", "THYROIDAB" in the last 72 hours.  Anemia Panel: No results for input(s): "VITAMINB12", "FOLATE", "FERRITIN", "TIBC", "IRON", "RETICCTPCT" in the last 72 hours.  Urine analysis:    Component Value Date/Time   COLORURINE YELLOW 01/18/2019 1230   APPEARANCEUR CLEAR 01/18/2019 1230   LABSPEC 1.017 01/18/2019 1230   PHURINE 5.0 01/18/2019 1230   GLUCOSEU NEGATIVE 01/18/2019 1230   HGBUR NEGATIVE 01/18/2019 1230  BILIRUBINUR NEGATIVE 01/18/2019 1230   KETONESUR NEGATIVE 01/18/2019 1230   PROTEINUR 100 (A) 01/18/2019 1230   NITRITE NEGATIVE 01/18/2019 1230   LEUKOCYTESUR NEGATIVE 01/18/2019 1230    Sepsis Labs: Lactic Acid, Venous    Component Value Date/Time   LATICACIDVEN 1.4 06/24/2022 1054    MICROBIOLOGY: Recent Results (from the past 240 hour(s))  Resp panel by RT-PCR (RSV, Flu A&B, Covid) Anterior Nasal Swab     Status: None   Collection Time: 06/24/22  8:52 AM   Specimen: Anterior Nasal Swab  Result Value Ref Range Status   SARS Coronavirus 2 by RT PCR NEGATIVE NEGATIVE Final    Comment: (NOTE) SARS-CoV-2 target nucleic acids are NOT DETECTED.  The SARS-CoV-2 RNA is generally detectable in upper respiratory specimens during the acute phase of infection. The lowest concentration of SARS-CoV-2 viral copies this assay can detect is 138 copies/mL. A negative result does not preclude SARS-Cov-2 infection  and should not be used as the sole basis for treatment or other patient management decisions. A negative result may occur with  improper specimen collection/handling, submission of specimen other than nasopharyngeal swab, presence of viral mutation(s) within the areas targeted by this assay, and inadequate number of viral copies(<138 copies/mL). A negative result must be combined with clinical observations, patient history, and epidemiological information. The expected result is Negative.  Fact Sheet for Patients:  BloggerCourse.com  Fact Sheet for Healthcare Providers:  SeriousBroker.it  This test is no t yet approved or cleared by the [place] FDA and  has been authorized for detection and/or diagnosis of SARS-CoV-2 by FDA under an Emergency Use Authorization (EUA). This EUA will remain  in effect (meaning this test can be used) for the duration of the COVID-19 declaration under Section 564(b)(1) of the Act, 21 U.S.C.section 360bbb-3(b)(1), unless the authorization is terminated  or revoked sooner.       Influenza A by PCR NEGATIVE NEGATIVE Final   Influenza B by PCR NEGATIVE NEGATIVE Final    Comment: (NOTE) The Xpert Xpress SARS-CoV-2/FLU/RSV plus assay is intended as an aid in the diagnosis of influenza from Nasopharyngeal swab specimens and should not be used as a sole basis for treatment. Nasal washings and aspirates are unacceptable for Xpert Xpress SARS-CoV-2/FLU/RSV testing.  Fact Sheet for Patients: BloggerCourse.com  Fact Sheet for Healthcare Providers: SeriousBroker.it  This test is not yet approved or cleared by the [place] FDA and has been authorized for detection and/or diagnosis of SARS-CoV-2 by FDA under an Emergency Use Authorization (EUA). This EUA will remain in effect (meaning this test can be used) for the duration of the COVID-19 declaration  under Section 564(b)(1) of the Act, 21 U.S.C. section 360bbb-3(b)(1), unless the authorization is terminated or revoked.     Resp Syncytial Virus by PCR NEGATIVE NEGATIVE Final    Comment: (NOTE) Fact Sheet for Patients: BloggerCourse.com  Fact Sheet for Healthcare Providers: SeriousBroker.it  This test is not yet approved or cleared by the [place] FDA and has been authorized for detection and/or diagnosis of SARS-CoV-2 by FDA under an Emergency Use Authorization (EUA). This EUA will remain in effect (meaning this test can be used) for the duration of the COVID-19 declaration under Section 564(b)(1) of the Act, 21 U.S.C. section 360bbb-3(b)(1), unless the authorization is terminated or revoked.  Performed at [facility], [address], [place], [place] [address]   Respiratory (~20 pathogens) panel by PCR     Status: Abnormal   Collection Time: 06/24/22  9:08 AM   Specimen: Nasopharyngeal Swab; Respiratory  Result Value Ref Range Status   Adenovirus NOT DETECTED NOT DETECTED Final   Coronavirus 229E NOT DETECTED NOT DETECTED Final    Comment: (NOTE) The Coronavirus on the Respiratory Panel, DOES NOT test for the novel  Coronavirus (2019 nCoV)    Coronavirus HKU1 NOT DETECTED NOT DETECTED Final   Coronavirus NL63 NOT DETECTED NOT DETECTED Final   Coronavirus OC43 NOT DETECTED NOT DETECTED Final   Metapneumovirus NOT DETECTED NOT DETECTED Final   Rhinovirus / Enterovirus NOT DETECTED NOT DETECTED Final   Influenza A NOT DETECTED NOT DETECTED Final   Influenza B NOT DETECTED NOT DETECTED Final   Parainfluenza Virus 1 NOT DETECTED NOT DETECTED Final   Parainfluenza Virus 2 NOT DETECTED NOT DETECTED Final   Parainfluenza Virus 3 DETECTED (A) NOT DETECTED Final   Parainfluenza Virus 4 NOT DETECTED NOT DETECTED Final   Respiratory Syncytial Virus NOT DETECTED NOT DETECTED Final   Bordetella pertussis NOT  DETECTED NOT DETECTED Final   Bordetella Parapertussis NOT DETECTED NOT DETECTED Final   Chlamydophila pneumoniae NOT DETECTED NOT DETECTED Final   Mycoplasma pneumoniae NOT DETECTED NOT DETECTED Final    Comment: Performed at [facility], [address] [place], [place] [address]    RADIOLOGY STUDIES/RESULTS: CT Head Wo Contrast  Result Date: 06/24/2022 CLINICAL DATA:  Cough, weakness, dizziness EXAM: CT HEAD WITHOUT CONTRAST TECHNIQUE: Contiguous axial images were obtained from the base of the skull through the vertex without intravenous contrast. RADIATION DOSE REDUCTION: This exam was performed according to the departmental dose-optimization program which includes automated exposure control, adjustment of the mA and/or kV according to patient size and/or use of iterative reconstruction technique. COMPARISON:  07/13/2021 FINDINGS: Brain: No evidence of acute infarction, hemorrhage, mass, mass effect, or midline shift. No hydrocephalus or extra-axial fluid collection. Periventricular white matter changes, likely the sequela of chronic small vessel ischemic disease. Vascular: No hyperdense vessel. Atherosclerotic calcifications in the intracranial carotid and vertebral arteries. Skull: Negative for fracture or focal lesion. Sinuses/Orbits: Mild mucosal thickening in the ethmoid air cells. Status post bilateral lens replacements. Other: None. IMPRESSION: No acute intracranial process. Electronically Signed   By: Wiliam Ke M.D.   On: 06/24/2022 14:39   DG Chest Port 1 View  Result Date: 06/24/2022 CLINICAL DATA:  Shortness of breath EXAM: PORTABLE CHEST 1 VIEW COMPARISON:  X-ray 11/04/2021 FINDINGS: Calcified aorta. Normal cardiopericardial silhouette. Elevation of the left hemidiaphragm. Slight vascular congestion. No pneumothorax or effusion. Overlapping cardiac leads. Degenerative changes seen of the left shoulder IMPRESSION: Elevation of the left hemidiaphragm. Calcified aorta. Central  vascular congestion and interstitial changes. Electronically Signed   By: Karen Kays M.D.   On: 06/24/2022 10:15     LOS: 1 day   Jeoffrey Massed, MD  [facility]    To contact the attending provider between 7A-7P or the covering provider during after hours 7P-7A, please log into the web site [email or web address] and access using universal [place] password for that web site. If you do not have the password, please call the hospital operator.  06/25/2022, 10:24 AM

## 2022-06-25 NOTE — Progress Notes (Signed)
   06/25/22 1755  Assess: MEWS Score  Temp 99 F (37.2 C)  BP (!) 88/44  MAP (mmHg) (!) 60  Pulse Rate (!) 116  ECG Heart Rate (!) 117  Resp (!) 33  SpO2 94 %  O2 Device Room Air  Assess: MEWS Score  MEWS Temp 0  MEWS Systolic 1  MEWS Pulse 2  MEWS RR 2  MEWS LOC 0  MEWS Score 5  MEWS Score Color Red  Assess: if the MEWS score is Yellow or Red  Were vital signs taken at a resting state? Yes  Focused Assessment No change from prior assessment  Does the patient meet 2 or more of the SIRS criteria? Yes  Does the patient have a confirmed or suspected source of infection? Yes  Provider and Rapid Response Notified? Yes  MEWS guidelines implemented  Yes, red  Treat  MEWS Interventions Considered administering scheduled or prn medications/treatments as ordered  Take Vital Signs  Increase Vital Sign Frequency  Red: Q1hr x2, continue Q4hrs until patient remains green for 12hrs  Escalate  MEWS: Escalate Red: Discuss with charge nurse and notify provider. Consider notifying RRT. If remains red for 2 hours consider need for higher level of care  Notify: Charge Nurse/RN  Name of Charge Nurse/RN Notified Teacher, adult education  Provider Notification  Provider Name/Title Dr. Jerral Ralph  Date Provider Notified 06/25/22  Time Provider Notified 1801  Method of Notification Page (secure chat)  Notification Reason Change in status  Provider response See new orders  Date of Provider Response 06/25/22  Time of Provider Response 1829  Assess: SIRS CRITERIA  SIRS Temperature  0  SIRS Pulse 1  SIRS Respirations  1  SIRS WBC 0  SIRS Score Sum  2

## 2022-06-25 NOTE — Progress Notes (Signed)
IV malfunction resulted in a delay in the initiation of the infusion. Discussed events and progress with provider at bedside to examine.  Additional dose of midodrine ordered for treatment of asx hypotension.  LR and albumin infusion in progress and ampicillin dose pending.  Will continue to monitor for changes.

## 2022-06-25 NOTE — Progress Notes (Signed)
Pharmacy Antibiotic Note  Ricky Harris is a 82 y.o. male with ESRD on HD admitted on 06/24/2022 with cough/weakness and encephalopathy.  Pharmacy has been consulted for Unasyn dosing for aspiration pneumonia.  Plan: Unasyn 1.5g IV q12h Monitor fever, WBC, duration of therapy  Height: 6' (182.9 cm) Weight: 81.2 kg (179 lb 0.2 oz) IBW/kg (Calculated) : 77.6  Temp (24hrs), Avg:99 F (37.2 C), Min:98.3 F (36.8 C), Max:99.8 F (37.7 C)  Recent Labs  Lab 06/24/22 0852 06/24/22 0854 06/24/22 0954 06/24/22 1054 06/24/22 2141 06/25/22 0309  WBC 7.0  --   --   --  7.3 7.4  CREATININE 4.43*  --  5.80*  --  8.18* 8.61*  LATICACIDVEN  --  2.4*  --  1.4  --   --     Estimated Creatinine Clearance: 7.5 mL/min (A) (by C-G formula based on SCr of 8.61 mg/dL (H)).    Allergies  Allergen Reactions   Cephalexin Hives   Hydralazine Hives    Antimicrobials this admission: 4/27 Unasyn >>  Dose adjustments this admission:  Microbiology results: 4/26 RVP: parainfluenza virus 3 detected  Thank you for allowing pharmacy to be a part of this patient's care.  Loralee Pacas, PharmD, BCPS Please see amion for complete clinical pharmacist phone list 06/25/2022 7:49 PM

## 2022-06-26 ENCOUNTER — Inpatient Hospital Stay (HOSPITAL_COMMUNITY): Payer: Medicare Other

## 2022-06-26 DIAGNOSIS — N186 End stage renal disease: Secondary | ICD-10-CM | POA: Diagnosis not present

## 2022-06-26 DIAGNOSIS — K7469 Other cirrhosis of liver: Secondary | ICD-10-CM | POA: Diagnosis not present

## 2022-06-26 DIAGNOSIS — J189 Pneumonia, unspecified organism: Secondary | ICD-10-CM | POA: Diagnosis not present

## 2022-06-26 DIAGNOSIS — Z992 Dependence on renal dialysis: Secondary | ICD-10-CM

## 2022-06-26 DIAGNOSIS — G9340 Encephalopathy, unspecified: Secondary | ICD-10-CM | POA: Diagnosis not present

## 2022-06-26 LAB — CBC
HCT: 27.7 % — ABNORMAL LOW (ref 39.0–52.0)
Hemoglobin: 9.4 g/dL — ABNORMAL LOW (ref 13.0–17.0)
MCH: 34.9 pg — ABNORMAL HIGH (ref 26.0–34.0)
MCHC: 33.9 g/dL (ref 30.0–36.0)
MCV: 103 fL — ABNORMAL HIGH (ref 80.0–100.0)
Platelets: 109 10*3/uL — ABNORMAL LOW (ref 150–400)
RBC: 2.69 MIL/uL — ABNORMAL LOW (ref 4.22–5.81)
RDW: 16.2 % — ABNORMAL HIGH (ref 11.5–15.5)
WBC: 9.2 10*3/uL (ref 4.0–10.5)
nRBC: 0 % (ref 0.0–0.2)

## 2022-06-26 LAB — RENAL FUNCTION PANEL
Albumin: 3.4 g/dL — ABNORMAL LOW (ref 3.5–5.0)
Anion gap: 12 (ref 5–15)
BUN: 27 mg/dL — ABNORMAL HIGH (ref 8–23)
CO2: 27 mmol/L (ref 22–32)
Calcium: 8.3 mg/dL — ABNORMAL LOW (ref 8.9–10.3)
Chloride: 95 mmol/L — ABNORMAL LOW (ref 98–111)
Creatinine, Ser: 6.04 mg/dL — ABNORMAL HIGH (ref 0.61–1.24)
GFR, Estimated: 9 mL/min — ABNORMAL LOW (ref >60)
Glucose, Bld: 101 mg/dL — ABNORMAL HIGH (ref 70–99)
Phosphorus: 3 mg/dL (ref 2.5–4.6)
Potassium: 4.1 mmol/L (ref 3.5–5.1)
Sodium: 134 mmol/L — ABNORMAL LOW (ref 135–145)

## 2022-06-26 MED ORDER — AZITHROMYCIN 500 MG PO TABS
500.0000 mg | ORAL_TABLET | Freq: Every day | ORAL | Status: AC
  Administered 2022-06-26 – 2022-06-28 (×3): 500 mg via ORAL
  Filled 2022-06-26 (×3): qty 1

## 2022-06-26 NOTE — Progress Notes (Addendum)
[facility] Progress Note   Subjective:   Patient seen and examined at bedside with multiple family members present.  Reports weakness and fatigue following dialysis yesterday with noted hypotension.  Net UF 2L with HD.  Concern for possible aspiration during during.  Ongoing cough which family states has been present for about a week.  Denies CP, SOB, abdominal pain and n/v/d.  Family reports recent stool incontinence periodically during dialysis the last month.  Also will have episodes at home.    Objective Vitals:   06/25/22 2119 06/26/22 0300 06/26/22 0500 06/26/22 0710  BP: (!) 70/35     Pulse:      Resp:      Temp:  99 F (37.2 C) 98.8 F (37.1 C)   TempSrc:   Oral   SpO2:    99%  Weight:      Height:       Physical Exam General: ill appearing frail, elderly male in NAD Heart:RRR Lungs: +scattered rhonchi Abdomen:soft, NTND Extremities:no LE edema Dialysis Access: LU AVF +b/t   Filed Weights   06/25/22 0356 06/25/22 1214 06/25/22 1603  Weight: 81.1 kg 81.3 kg 81.2 kg    Intake/Output Summary (Last 24 hours) at 06/26/2022 1048 Last data filed at 06/26/2022 0911 Gross per 24 hour  Intake 100 ml  Output 2000 ml  Net -1900 ml    Additional Objective Labs: Basic Metabolic Panel: Recent Labs  Lab 06/24/22 0852 06/24/22 0954 06/24/22 2141 06/25/22 0309 06/26/22 0214  NA 140 140  --  134* 134*  K 3.0* 4.2  --  4.9 4.1  CL 113* 104  --  97* 95*  CO2 18*  --   --  24 27  GLUCOSE 82 84  --  140* 101*  BUN 19 23  --  37* 27*  CREATININE 4.43* 5.80* 8.18* 8.61* 6.04*  CALCIUM 5.7*  --   --  8.6* 8.3*  PHOS  --   --   --   --  3.0   Liver Function Tests: Recent Labs  Lab 06/24/22 0852 06/25/22 0309 06/26/22 0214  AST 27 36  --   ALT 18 25  --   ALKPHOS 106 149*  --   BILITOT 0.7 1.4*  --   PROT 4.6* 6.6  --   ALBUMIN 2.3* 3.0* 3.4*   CBC: Recent Labs  Lab 06/24/22 0852 06/24/22 0954 06/24/22 2141 06/25/22 0309 06/26/22 0214  WBC  7.0  --  7.3 7.4 9.2  HGB 10.6*   < > 10.5* 10.5* 9.4*  HCT 33.3*   < > 32.4* 32.6* 27.7*  MCV 106.4*  --  105.5* 106.2* 103.0*  PLT 111*  --  107* 108* 109*   < > = values in this interval not displayed.    Studies/Results: DG Chest Port 1 View  Result Date: 06/26/2022 CLINICAL DATA:  Shortness of breath. EXAM: PORTABLE CHEST 1 VIEW COMPARISON:  06/25/2022 FINDINGS: The cardio pericardial silhouette is enlarged. Asymmetric elevation left hemidiaphragm. There is bibasilar atelectasis or infiltrate, similar to prior. No acute bony abnormality. Telemetry leads overlie the chest. IMPRESSION: Bibasilar atelectasis or infiltrate, progressive on the left. Electronically Signed   By: Kennith Center M.D.   On: 06/26/2022 08:59   DG Chest Port 1 View  Result Date: 06/25/2022 CLINICAL DATA:  Cough and shortness of breath EXAM: PORTABLE CHEST 1 VIEW COMPARISON:  06/24/2022 FINDINGS: Cardiac shadow is stable. Aortic calcifications are noted. Lungs are well aerated bilaterally. Increasing right basilar  airspace opacity is noted consistent with developing infiltrate. No other focal abnormality is noted. IMPRESSION: Developing basilar infiltrate on the right. Electronically Signed   By: Alcide Clever M.D.   On: 06/25/2022 19:26   CT Head Wo Contrast  Result Date: 06/24/2022 CLINICAL DATA:  Cough, weakness, dizziness EXAM: CT HEAD WITHOUT CONTRAST TECHNIQUE: Contiguous axial images were obtained from the base of the skull through the vertex without intravenous contrast. RADIATION DOSE REDUCTION: This exam was performed according to the departmental dose-optimization program which includes automated exposure control, adjustment of the mA and/or kV according to patient size and/or use of iterative reconstruction technique. COMPARISON:  07/13/2021 FINDINGS: Brain: No evidence of acute infarction, hemorrhage, mass, mass effect, or midline shift. No hydrocephalus or extra-axial fluid collection. Periventricular white  matter changes, likely the sequela of chronic small vessel ischemic disease. Vascular: No hyperdense vessel. Atherosclerotic calcifications in the intracranial carotid and vertebral arteries. Skull: Negative for fracture or focal lesion. Sinuses/Orbits: Mild mucosal thickening in the ethmoid air cells. Status post bilateral lens replacements. Other: None. IMPRESSION: No acute intracranial process. Electronically Signed   By: Wiliam Ke M.D.   On: 06/24/2022 14:39    Medications:  ampicillin-sulbactam (UNASYN) IV 1.5 g (06/26/22 0913)    apixaban  2.5 mg Oral BID   atorvastatin  40 mg Oral Daily   azithromycin  500 mg Oral Daily   Chlorhexidine Gluconate Cloth  6 each Topical Q0600   donepezil  5 mg Oral QHS   ipratropium-albuterol  3 mL Nebulization STAT   [START ON 06/27/2022] isosorbide mononitrate  15 mg Oral Daily   levothyroxine  50 mcg Oral Daily   [START ON 06/27/2022] metoprolol succinate  12.5 mg Oral Daily   multivitamin  1 tablet Oral Daily    Dialysis Orders: TTS - [facility]  3.75hrs, BFR 350, DFR 500,  EDW 83.5kg, 3K/ 2.5Ca   Access: LU AVF  Heparin none Mircera 50 mcg q2wks - last 4/25 Venofer 100mg  qHD x10 Calcitriol 0.19mcg PO qHD   Assessment/Plan:  Acute metabolic encephalopathy - w/ cognitive dysfunction at baseline. likely 2/2 Parainfluenza Viral infection.  Parainfluenza - per PMD.  Possible aspiration - swallow study this AM - moderate aspiration risk.  Additional swallow study to eval esophagus to follow.   Fatigue/weakness/deconditioning - likely 2/2 #2.  Hypocalcemia - Ca 5.7 on admit, improved to 8.3 w/calcium gluconate - this is close to baseline. Continue to follow.   ESRD -  On HD TTS.  Next HD 06/28/22.  Hypertension/volume  - BP well controlled. On toprol-Xl 37.5mg  qd and Imdur 15mg  qd.   Anemia of CKD - Hgb 9.4. ESA recently dosed.   Secondary Hyperparathyroidism -  Ca as noted above. Phos in goal. Continue VDRA. Not on binders.   Nutrition - Renal diet  w/fluid restrictions.  Virgina Norfolk, PA-C [facility] 06/26/2022,10:48 AM  LOS: 2 days     Seen and examined independently.  Agree with note and exam as documented above by Virgina Norfolk, PA and as noted here.  He had a coughing spell yesterday.  He was started on antibiotics as coverage for aspiration.  He has just had lunch in the chair and wants to get back in the bed.  He had a swallow study this morning and was felt to be aspiration risk especially when tired.  Recommendations include only feeding him when he alert.    General elderly male in bed in no acute distress HEENT normocephalic atraumatic extraocular movements intact sclera  anicteric Neck supple trachea midline Lungs clear to auscultation bilaterally normal work of breathing at rest on room air for me. No coughing today Heart S1S2 no rub Abdomen soft nontender nondistended Extremities no edema edema  Psych normal mood and affect Neuro he is oriented to person and location but doesn't guess the year. Response to location is quite delayed but correct Access LUE AVF with bruit and thrill   # Acute encephalopathy  - background of at least some memory impairment though I do not see a diagnosis specified in the chart  - improving or perhaps fluctuating per my exams    # Parainfluenza - Per primary team    # Deconditioning - Per primary team    # ESRD  - HD per TTS schedule   # HTN  - optimize volume with HD esp in light of new viral URI  - BP a bit low at times on a regimen that may be directed at his heart.  He may need to stop imdur    # Anemia of CKD  - outpatient orders as above and recently dosed on 4/25   # Hypocalcemia and secondary hyperparathyroidism  - calcium repleted - on activated vitamin D    Disposition per primary team.     Estanislado Emms, MD 06/26/2022 1:21 PM

## 2022-06-26 NOTE — Procedures (Signed)
Modified Barium Swallow Study  Patient Details  Name: Ricky Harris MRN: [id number] Date of Birth: [birth year]  Today's Date: 06/26/2022  Modified Barium Swallow completed.  Full report located under Chart Review in the Imaging Section.  History of Present Illness Patient with multiple medical problems including end-stage renal disease, who completed dialysis yesterday presents with 2 days of weakness, dizziness, cough. Suspected aspiration event with dinner 06/25/22, made NPO, CXR reveals developing RLL infiltrates.   Clinical Impression Mr. Begue demonstrates normal oropharyngeal swallow function. All structures showed adequate strength/ROM for safe and efficient swallow across consistencies without aspiration. Of note, at bedside, pt was having significant coughing across all consistencies, but no coughing occurred during this study. There is potential that his instances of coughing with intake are related to penetration/aspiration/esophageal deficits; however, none were visualized on this study. Suspect aspiration event last night after dialysis was r/t fatigue and/or positioning, given pt/family report of partially reclined in bed and extreme "exhaustion" after receiving dialysis. Pt should only be fed when alert.  Age-appropriate differences observed were in mildly reduced base of tongue retraction and epiglottic inversion, which resulted in trace and transient penetration of thin liquids x1 as well as mild vallecular residue after solids. Residue cleared with a spontaneous secondary swallow.  Recommend: thin liquids, regular consistency solids, meds as tolerated, upright at 90 degrees during and 30 mins after all PO intake, only feed when alert, SLP service to follow up to ensure tolerance of newly upgraded diet.  Factors that may increase risk of adverse event in presence of aspiration Rubye Oaks [employer] Clearance Coots 2021): Frail or deconditioned  Swallow Evaluation Recommendations Recommendations:  PO diet PO Diet Recommendation: Regular;Thin liquids (Level 0) Liquid Administration via: Cup;Straw Medication Administration: Whole meds with liquid Supervision: Patient able to self-feed;Intermittent supervision/cueing for swallowing strategies Postural changes: Position pt fully upright for meals;Out of bed for meals Oral care recommendations: Oral care BID (2x/day)     Brysin Towery P. Jacia Sickman, M.S., CCC-SLP Speech-Language Pathologist Acute Rehabilitation Services Pager: [phone]  Susanne Borders Lael Wetherbee 06/26/2022,11:42 AM

## 2022-06-26 NOTE — Evaluation (Signed)
Clinical/Bedside Swallow Evaluation Patient Details  Name: Ricky Harris MRN: [id number] Date of Birth: [birth year]  Today's Date: 06/26/2022 Time: SLP Start Time (ACUTE ONLY): 0825 SLP Stop Time (ACUTE ONLY): 0845 SLP Time Calculation (min) (ACUTE ONLY): 20 min  Past Medical History:  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[facility])    Coronary artery disease    on CT scan   Gout    H/O hypercholesterolemia    Heart murmur    History of falling    recent- see ct chest results of 04/25/2018   Hypertension    Hypothyroidism    Liver cirrhosis (HCC)    Myocardial infarction (HCC)    NSTEMI 04/25/21, treated medically   Prediabetes    pt denies    Renal artery stenosis (HCC)    Short-term memory loss    Thrombocytopenia (HCC)    Wears glasses    Past Surgical History:  Past Surgical History:  Procedure Laterality Date   ABDOMINAL AORTIC ENDOVASCULAR STENT GRAFT N/A 01/21/2019   Procedure: ABDOMINAL AORTIC ENDOVASCULAR STENT GRAFT WITH CO2;  Surgeon: Chuck Hint, MD;  Location: [facility] OR;  Service: Vascular;  Laterality: N/A;   ABDOMINAL AORTOGRAM W/LOWER EXTREMITY Bilateral 12/14/2018   Procedure: ABDOMINAL AORTOGRAM W/LOWER EXTREMITY;  Surgeon: Chuck Hint, MD;  Location: [facility] INVASIVE CV LAB;  Service: Cardiovascular;  Laterality: Bilateral;   AV FISTULA PLACEMENT Left 03/12/2019   Procedure: LEFT ARTERIOVENOUS Arteriovenous FISTULA CREATION.;  Surgeon: Chuck Hint, MD;  Location: [facility] OR;  Service: Vascular;  Laterality: Left;   CATARACT EXTRACTION W/ INTRAOCULAR LENS  IMPLANT, BILATERAL     CHOLECYSTECTOMY      ESOPHAGEAL BANDING N/A 08/11/2017   Procedure: ESOPHAGEAL BANDING;  Surgeon: Kathi Der, MD;  Location: [facility] ENDOSCOPY;  Service: Gastroenterology;  Laterality: N/A;   ESOPHAGEAL BANDING N/A 10/26/2017   Procedure: ESOPHAGEAL BANDING;  Surgeon: Kathi Der, MD;  Location: [facility];  Service: Gastroenterology;  Laterality: N/A;   ESOPHAGEAL BANDING N/A 12/19/2017   Procedure: ESOPHAGEAL BANDING;  Surgeon: Kathi Der, MD;  Location: [facility];  Service: Gastroenterology;  Laterality: N/A;   ESOPHAGOGASTRODUODENOSCOPY (EGD) WITH PROPOFOL N/A 07/05/2017   Procedure: ESOPHAGOGASTRODUODENOSCOPY (EGD) WITH PROPOFOL;  Surgeon: Kathi Der, MD;  Location: [facility];  Service: Gastroenterology;  Laterality: N/A;   ESOPHAGOGASTRODUODENOSCOPY (EGD) WITH PROPOFOL N/A 08/11/2017   Procedure: ESOPHAGOGASTRODUODENOSCOPY (EGD) WITH PROPOFOL;  Surgeon: Kathi Der, MD;  Location: [facility];  Service: Gastroenterology;  Laterality: N/A;   ESOPHAGOGASTRODUODENOSCOPY (EGD) WITH PROPOFOL N/A 10/26/2017   Procedure: ESOPHAGOGASTRODUODENOSCOPY (EGD) WITH PROPOFOL;  Surgeon: Kathi Der, MD;  Location: [facility];  Service: Gastroenterology;  Laterality: N/A;   ESOPHAGOGASTRODUODENOSCOPY (EGD) WITH PROPOFOL N/A 12/19/2017   Procedure: ESOPHAGOGASTRODUODENOSCOPY (EGD) WITH PROPOFOL;  Surgeon: Kathi Der, MD;  Location: [facility];  Service: Gastroenterology;  Laterality: N/A;   ESOPHAGOGASTRODUODENOSCOPY (EGD) WITH PROPOFOL N/A 10/29/2018   Procedure: ESOPHAGOGASTRODUODENOSCOPY (EGD) WITH PROPOFOL;  Surgeon: Kathi Der, MD;  Location: [facility];  Service: Gastroenterology;  Laterality: N/A;   ESOPHAGOGASTRODUODENOSCOPY (EGD) WITH PROPOFOL N/A  02/14/2020   Procedure: ESOPHAGOGASTRODUODENOSCOPY (EGD) WITH PROPOFOL;  Surgeon: Kathi Der, MD;  Location: [facility];  Service: Gastroenterology;  Laterality: N/A;   EYE SURGERY     bilateral cataract removal  with lens placement   HERNIA REPAIR     IR ANGIOGRAM PULMONARY BILATERAL SELECTIVE  12/03/2021   IR ANGIOGRAM SELECTIVE EACH ADDITIONAL VESSEL  12/03/2021   IR ANGIOGRAM VISCERAL SELECTIVE  12/03/2021   IR ANGIOGRAM VISCERAL SELECTIVE  12/03/2021   IR ANGIOGRAM VISCERAL SELECTIVE  12/03/2021   IR CT SPINE LTD  12/03/2021   IR PARACENTESIS  12/26/2018   IR PARACENTESIS  01/02/2019   IR PARACENTESIS  01/15/2019   IR PARACENTESIS  01/30/2019   IR PARACENTESIS  02/05/2019   IR PARACENTESIS  02/14/2019   IR PARACENTESIS  02/28/2019   IR PARACENTESIS  03/14/2019   IR PARACENTESIS  03/29/2019   IR PARACENTESIS  04/11/2019   IR PARACENTESIS  04/25/2019   IR PARACENTESIS  05/09/2019   IR PARACENTESIS  06/03/2019   IR PARACENTESIS  06/06/2019   IR PARACENTESIS  06/20/2019   IR PARACENTESIS  07/03/2019   IR PARACENTESIS  07/19/2019   IR PARACENTESIS  08/08/2019   IR PARACENTESIS  08/27/2019   IR PARACENTESIS  09/09/2019   IR PARACENTESIS  09/24/2019   IR PARACENTESIS  10/07/2019   IR PARACENTESIS  10/23/2019   IR PARACENTESIS  11/07/2019   IR PARACENTESIS  11/21/2019   IR PARACENTESIS  12/04/2019   IR PARACENTESIS  12/25/2019   IR PARACENTESIS  01/10/2020   IR PARACENTESIS  01/27/2020   IR PARACENTESIS  02/19/2020   IR PARACENTESIS  03/27/2020   IR RADIOLOGIST EVAL & MGMT  11/16/2021   IR TRANSCATH/EMBOLIZ  12/03/2021   IR US GUIDE VASC ACCESS RIGHT  12/03/2021   PROSTATECTOMY     RADIOLOGY WITH ANESTHESIA N/A 12/03/2021   Procedure: Type II Endoleak;  Surgeon: Gilmer Mor, DO;  Location: [facility] OR;  Service: Anesthesiology;  Laterality: N/A;   RIGHT AND LEFT HEART CATH N/A 09/18/2020   Procedure: RIGHT AND LEFT HEART CATH;  Surgeon: Lennette Bihari, MD;  Location: [facility] INVASIVE CV LAB;  Service: Cardiovascular;  Laterality: N/A;   subtotal gastrectomy     HPI:  Patient with multiple medical problems including end-stage renal disease, who completed dialysis yesterday presents with 2 days of weakness, dizziness, cough.  Suspected aspiration event with dinner 06/25/22, made NPO, CXR reveals developing RLL infiltrates.    Assessment / Plan / Recommendation  Clinical Impression  Ricky Harris was seen with multiple family members present after a suspected aspiration event last night during dinner. CXR is suspicious for aspiration/infiltrate. Pt/family/RN all confirm he was exhausted after dialysis last night and was very fatigued prior to the meal. Family does report a chronic cough after intake for many years. They state it occurs during meals and lasts up to 30 mins after a meal, concerning for esophageal phase deficits. Oral mech exam was unremarkable. Pt was in a playful mood, but fully participatory.  PO trials as seen below with noted coughing after all trials except the ice chips. Cough was immediate, but strong. He said, "Am I failing your test?" SLP explained the concern for aspiration, but the chance that the cough is r/t esophageal phase and should be evaluated under fluoroscopy. Pt/family/RN agree with MBSS same date and to remain NPO until that time.  SLP Visit Diagnosis: Dysphagia, unspecified (R13.10)    Aspiration Risk  Moderate aspiration risk    Diet  Recommendation NPO except meds;Free water protocol after oral care   Postural Changes: Seated upright at 90 degrees;Remain upright for at least 30 minutes after po intake    Other  Recommendations Oral Care Recommendations: Oral care BID;Oral care prior to ice chip/H20    Recommendations for follow up therapy are one component of a multi-disciplinary discharge planning process, led by the attending physician.  Recommendations may be updated based on patient status, additional functional criteria and insurance authorization.  Follow up Recommendations Other (comment) (TBD)      Assistance Recommended at Discharge  TBD  Functional Status Assessment Patient has had a recent decline in their functional status and demonstrates the ability to make  significant improvements in function in a reasonable and predictable amount of time.  Frequency and Duration min 2x/week  2 weeks       Prognosis Prognosis for improved oropharyngeal function: Fair      Swallow Study   General Date of Onset: 06/23/22 HPI: Patient with multiple medical problems including end-stage renal disease, who completed dialysis yesterday presents with 2 days of weakness, dizziness, cough. Suspected aspiration event with dinner 06/25/22, made NPO, CXR reveals developing RLL infiltrates. Type of Study: Bedside Swallow Evaluation Previous Swallow Assessment: none Diet Prior to this Study: NPO Temperature Spikes Noted: Yes Respiratory Status: Room air History of Recent Intubation: No Behavior/Cognition: Cooperative;Pleasant mood;Alert Oral Cavity Assessment: Within Functional Limits Oral Care Completed by SLP: Yes Oral Cavity - Dentition: Adequate natural dentition Vision: Functional for self-feeding Self-Feeding Abilities: Able to feed self Patient Positioning: Upright in bed Baseline Vocal Quality: Normal Volitional Cough: Strong Volitional Swallow: Able to elicit    Oral/Motor/Sensory Function Overall Oral Motor/Sensory Function: Within functional limits   Ice Chips Ice chips: Within functional limits Presentation: Spoon   Thin Liquid Thin Liquid: Impaired Pharyngeal  Phase Impairments: Cough - Immediate    Puree Puree: Impaired Presentation: Spoon Pharyngeal Phase Impairments: Cough - Immediate   Solid     Solid: Impaired Presentation: Spoon Pharyngeal Phase Impairments: Cough - Immediate     Jaree Dwight P. Ramaj Frangos, M.S., CCC-SLP Speech-Language Pathologist Acute Rehabilitation Services Pager: [phone]  Susanne Borders Lynette Topete 06/26/2022,9:04 AM

## 2022-06-26 NOTE — Evaluation (Addendum)
Occupational Therapy Evaluation Patient Details Name: Ricky Harris MRN: [id number] DOB: [birth year] Today's Date: 06/26/2022   History of Present Illness Pt is an 82 y.o. male who presented 06/24/22 with cough/weakness and confusion. CT head with no acute intracranial abnormality. Positive for parainfluenza virus 3. Admitted also with acute metabolic encephalopathy likely due to parainfluenza virus infection. 4/28>> CXR: Bibasilar infiltrate-progressive on the left. PMH: ESRD on HD TTS, afib, AAA, diastolic CHF, HTN, history of liver cirrhosis nonalcoholic, HLD, hypothyroidism, anemia, arthritis, cancer, gout, MI, prediabetes, short-term memory loss   Clinical Impression   At baseline, Ricky Harris lives with his family and is modified independent with ADL and mobility @ RW level. Family assists with medication management and IADL tasks. Pt demonstrates a functional decline, requiring min A with mobility and ADL tasks due to deficits listed below. Patient will benefit from continued inpatient follow up therapy, <3 hours/day; family reports they are unable to provide 24/7 S. Acute OT to follow.      Recommendations for follow up therapy are one component of a multi-disciplinary discharge planning process, led by the attending physician.  Recommendations may be updated based on patient status, additional functional criteria and insurance authorization.   Assistance Recommended at Discharge Frequent or constant Supervision/Assistance  Patient can return home with the following A little help with walking and/or transfers;A little help with bathing/dressing/bathroom;Assistance with cooking/housework;Direct supervision/assist for medications management;Direct supervision/assist for financial management;Assist for transportation;Help with stairs or ramp for entrance    Functional Status Assessment  Patient has had a recent decline in their functional status and demonstrates the ability to make significant  improvements in function in a reasonable and predictable amount of time.  Equipment Recommendations  None recommended by OT    Recommendations for Other Services       Precautions / Restrictions Precautions Precautions: Fall;Other (comment) Precaution Comments: watch BP (orthostatic 4/28) Restrictions Weight Bearing Restrictions: No      Mobility Bed Mobility Overal bed mobility: Needs Assistance Bed Mobility: Supine to Sit, Sit to Supine     Supine to sit: Min guard, HOB elevated Sit to supine: Min guard, HOB elevated   General bed mobility comments: Extra time, min guard forsafety    Transfers Overall transfer level: Needs assistance Equipment used: 1 person hand held assist Transfers: Sit to/from Stand Sit to Stand: Min assist                  Balance Overall balance assessment: Needs assistance   Sitting balance-Leahy Scale: Good       Standing balance-Leahy Scale: Poor                             ADL either performed or assessed with clinical judgement   ADL Overall ADL's : Needs assistance/impaired Eating/Feeding: NPO   Grooming: Set up;Supervision/safety   Upper Body Bathing: Minimal assistance   Lower Body Bathing: Moderate assistance;Sit to/from stand   Upper Body Dressing : Minimal assistance   Lower Body Dressing: Moderate assistance   Toilet Transfer: Minimal assistance;Stand-pivot   Toileting- Clothing Manipulation and Hygiene: Minimal assistance       Functional mobility during ADLs: Minimal assistance General ADL Comments: fatigues easily     Vision Baseline Vision/History: 1 Wears glasses Additional Comments: will further assess     Perception     Praxis      Pertinent Vitals/Pain Pain Assessment Pain Assessment: Faces Faces Pain Scale: Hurts a little bit  Pain Location: generalized Pain Descriptors / Indicators: Grimacing Pain Intervention(s): Limited activity within patient's tolerance     Hand  Dominance Right   Extremity/Trunk Assessment Upper Extremity Assessment Upper Extremity Assessment: Generalized weakness   Lower Extremity Assessment Lower Extremity Assessment: Defer to PT evaluation   Cervical / Trunk Assessment Cervical / Trunk Assessment: Normal   Communication Communication Communication: HOH   Cognition Arousal/Alertness: Awake/alert Behavior During Therapy: WFL for tasks assessed/performed Overall Cognitive Status: Impaired/Different from baseline Area of Impairment: Attention, Problem solving, Safety/judgement, Awareness                   Current Attention Level: Sustained     Safety/Judgement: Decreased awareness of deficits, Decreased awareness of safety Awareness: Emergent Problem Solving: Slow processing, Requires verbal cues General Comments: Some memory deficits at baseline; pt HOH but appearing to be slow in processing cues. Joking with therapist at times however visibly does not feel well. Will further assess.     General Comments  VSS in supine/sitting; standing not assessed as pt taken for [facility]    Exercises     Shoulder Instructions      Home Living Family/patient expects to be discharged to:: Private residence Living Arrangements: Children (son and daughter-in-law) Available Help at Discharge: Family;Available PRN/intermittently (Is alone 1-2 hours per day) Type of Home: Mobile home Home Access: Stairs to enter Entrance Stairs-Number of Steps: 5 Entrance Stairs-Rails: Right;Left Home Layout: One level     Bathroom Shower/Tub: Producer, television/film/video: Standard Bathroom Accessibility: Yes How Accessible: Accessible via walker Home Equipment: Rolling Walker (2 wheels);Shower seat;Wheelchair - manual          Prior Functioning/Environment Prior Level of Function : Independent/Modified Independent;History of Falls (last six months)             Mobility Comments: Mod I using RW. ADLs Comments: Dresses  himself, does not shower much (hasn't had a shower since October); does not drive; family manages meds and finances        OT Problem List: Decreased strength;Decreased activity tolerance;Impaired balance (sitting and/or standing);Decreased safety awareness;Decreased knowledge of use of DME or AE;Cardiopulmonary status limiting activity      OT Treatment/Interventions: Self-care/ADL training;Therapeutic exercise;Energy conservation;DME and/or AE instruction;Therapeutic activities;Patient/family education;Balance training;Cognitive remediation/compensation    OT Goals(Current goals can be found in the care plan section) Acute Rehab OT Goals Patient Stated Goal: none stated OT Goal Formulation: With patient/family Time For Goal Achievement: 07/10/22 Potential to Achieve Goals: Good  OT Frequency: Min 2X/week    Co-evaluation              AM-PAC OT "6 Clicks" Daily Activity     Outcome Measure Help from another person eating meals?: Total (NPO) Help from another person taking care of personal grooming?: A Little Help from another person toileting, which includes using toliet, bedpan, or urinal?: A Little Help from another person bathing (including washing, rinsing, drying)?: A Lot Help from another person to put on and taking off regular upper body clothing?: A Little Help from another person to put on and taking off regular lower body clothing?: A Lot 6 Click Score: 14   End of Session Equipment Utilized During Treatment: Gait belt Nurse Communication: Mobility status  Activity Tolerance: Patient limited by fatigue Patient left: in bed;with call bell/phone within reach;with family/visitor present (being taken for procedure)  OT Visit Diagnosis: Unsteadiness on feet (R26.81);Muscle weakness (generalized) (M62.81);Other symptoms and signs involving cognitive function  Time: 1308-6578 OT Time Calculation (min): 14 min Charges:  OT General Charges $OT Visit: 1  Visit OT Evaluation $OT Eval Moderate Complexity: 1 Mod  Tymere Depuy, OT/L   Acute OT Clinical Specialist Acute Rehabilitation Services Pager [phone] Office [phone]   [facility] 06/26/2022, 11:14 AM

## 2022-06-26 NOTE — Progress Notes (Addendum)
PROGRESS NOTE        PATIENT DETAILS Name: Ricky Harris Age: 82 y.o. Sex: male Date of Birth: October 13, 1940 Admit Date: 06/24/2022 Admitting Physician Dewayne Shorter Levora Dredge, MD ZOX:[id number], [name]., MD  Brief Summary: Patient is a 82 y.o.  male with history of ESRD on HD TTS-who presented with cough/weakness and encephalopathy.  See below for further details.  Significant events: 4/26>> cough/weakness/encephalopathy-admit to [facility]. 4/27>> tachycardia/hypotension after HD-coughing spell with suspicion for aspiration-repeat CXR with PNA-midodrine/IVF bolus/IV albumin.  Significant studies: 4/26>> CT head: No acute intracranial abnormality. 4/26>> CXR: No PNA.  Central vascular congestion.  Significant microbiology data: 4/26>> respiratory virus panel: Parainfluenza virus 3 4/26>> COVID/RSV/influenza PCR: Negative 4/27>> CXR: Developing basilar infiltrate on right 4/28>> CXR: Bibasilar infiltrate-progressive on the left.  Procedures: None  Consults: Nephrology  Subjective: Continues to cough-hypotensive yesterday evening after HD-BP has now stabilized.  Objective: Vitals: Blood pressure (!) 70/35, pulse (!) 116, temperature 98.8 F (37.1 C), temperature source Oral, resp. rate (!) 23, height 6' (1.829 m), weight 81.2 kg, SpO2 99 %.   Exam: Gen Exam:Alert awake-not in any distress HEENT:atraumatic, normocephalic Chest: B/L clear to auscultation anteriorly CVS:S1S2 regular Abdomen:soft non tender, non distended Extremities:no edema Neurology: Non focal Skin: no rash  Pertinent Labs/Radiology:    Latest Ref Rng & Units 06/26/2022    2:14 AM 06/25/2022    3:09 AM 06/24/2022    9:41 PM  CBC  WBC 4.0 - 10.5 K/uL 9.2  7.4  7.3   Hemoglobin 13.0 - 17.0 g/dL 9.4  40.9  81.1   Hematocrit 39.0 - 52.0 % 27.7  32.6  32.4   Platelets 150 - 400 K/uL 109  108  107     Lab Results  Component Value Date   NA 134 (L) 06/26/2022   K 4.1 06/26/2022   CL 95  (L) 06/26/2022   CO2 27 06/26/2022      Assessment/Plan: Acute metabolic encephalopathy Likely due to PNA superimposed on cognitive dysfunction/dementia Overall improved-at risk for delirium during this hospitalization Delirium precautions Supportive care.   Presumed aspiration PNA Parainfluenza infection Had coughing spell-with suspicion for aspiration on 4/27-subsequently developed transient hypotension/tachycardia that responded to IV fluid boluses Repeat chest x-ray on 4/27/4/28 shows bibasilar infiltrates Has been started on Unasyn 4/27- add Zithromax today x 3 days.  Transient hypotension Suspect this is more related to volume removal with HD in the setting of chronic systolic heart failure than PNA/sepsis physiology. Respond to to gentle volume resuscitation with IVF/IV albumin-BP stable this morning-hold all antihypertensives today.  Fatigue/debility/deconditioning Family reports he is much weaker than his baseline-likely due to acute viral/parainfluenza infection PT/OT eval to determine safe disposition-as very frail and elderly at baseline.  PAF Telemetry monitoring Eliquis Metoprolol hold-should be able to resume tomorrow-if BP stable.  CAD-known triple-vessel disease by [facility] 2022 Managed medically-not a candidate for CABG or PCI per last cardiology note No anginal symptoms currently On on Eliquis-Toprol/Imdur held. Per last cardiology note-no longer on Plavix.   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  Suspect he does have some amount of dementia at baseline. Maintain delirium precautions.    Hypothyroidism Levothyroxine  Palliative care DNR in place Thankfully has responded with supportive care-but family aware that apart from antibiotics and other supportive care-no role for escalation in care.  If he were to deteriorate significantly in spite of current measures-he would be appropriate for hospice care.  BMI: Estimated body mass index is 24.28 kg/m as calculated from the following:   Height as of this encounter: 6' (1.829 m).   Weight as of this encounter: 81.2 kg.   Code status:   Code Status: DNR   DVT Prophylaxis: apixaban (ELIQUIS) tablet 2.5 mg Start: 06/25/22 1000 apixaban (ELIQUIS) tablet 2.5 mg    Family Communication: Son at bedside   Disposition Plan: Status is: Observation The patient will require care spanning > 2 midnights and should be moved to inpatient because: Severity of illness   Planned Discharge Destination:Home health   Diet: Diet Order             Diet NPO time specified Except for: Sips with Meds  Diet effective now                     Antimicrobial agents: Anti-infectives (From admission, onward)    Start     Dose/Rate Route Frequency Ordered Stop   06/26/22 1000  azithromycin (ZITHROMAX) tablet 500 mg        500 mg Oral Daily 06/26/22 0656 06/29/22 0959   06/25/22 2100  ampicillin-sulbactam (UNASYN) 1.5 g in sodium chloride 0.9 % 100 mL IVPB        1.5 g 200 mL/hr over 30 Minutes Intravenous Every 12 hours 06/25/22 1948          MEDICATIONS: Scheduled Meds:  apixaban  2.5 mg Oral BID   atorvastatin  40 mg Oral Daily   azithromycin  500 mg Oral Daily   Chlorhexidine Gluconate Cloth  6 each Topical Q0600   donepezil  5 mg Oral QHS   ipratropium-albuterol  3 mL Nebulization STAT   [START ON 06/27/2022] isosorbide mononitrate  15 mg Oral  Daily   levothyroxine  50 mcg Oral Daily   [START ON 06/27/2022] metoprolol succinate  12.5 mg Oral Daily   multivitamin  1 tablet Oral Daily   Continuous Infusions:  ampicillin-sulbactam (UNASYN) IV 1.5 g (06/26/22 0913)   PRN Meds:.acetaminophen **OR** acetaminophen, albuterol, benzonatate, calcium carbonate (dosed in mg elemental calcium), camphor-menthol **AND** hydrOXYzine, docusate sodium, feeding supplement (NEPRO CARB STEADY), ondansetron **OR** ondansetron (ZOFRAN) IV, sorbitol, zolpidem   I have personally reviewed following labs and imaging studies  LABORATORY DATA: CBC: Recent Labs  Lab 06/24/22 0852 06/24/22 0954 06/24/22 2141 06/25/22 0309 06/26/22 0214  WBC 7.0  --  7.3 7.4 9.2  HGB 10.6* 9.2* 10.5* 10.5* 9.4*  HCT 33.3* 27.0* 32.4* 32.6* 27.7*  MCV 106.4*  --  105.5* 106.2* 103.0*  PLT 111*  --  107* 108* 109*     Basic Metabolic Panel: Recent Labs  Lab 06/24/22 0852 06/24/22 0954 06/24/22 2141 06/25/22 0309 06/26/22 0214  NA 140 140  --  134* 134*  K 3.0* 4.2  --  4.9 4.1  CL 113* 104  --  97* 95*  CO2 18*  --   --  24 27  GLUCOSE 82 84  --  140* 101*  BUN 19 23  --  37* 27*  CREATININE 4.43* 5.80* 8.18* 8.61* 6.04*  CALCIUM 5.7*  --   --  8.6* 8.3*  MG 1.3*  --   --   --   --   PHOS  --   --   --   --  3.0     GFR: Estimated Creatinine Clearance: 10.7 mL/min (A) (by C-G formula based on SCr of 6.04 mg/dL (H)).  Liver Function Tests: Recent Labs  Lab 06/24/22 0852 06/25/22 0309 06/26/22 0214  AST 27 36  --   ALT 18 25  --   ALKPHOS 106 149*  --   BILITOT 0.7 1.4*  --   PROT 4.6* 6.6  --   ALBUMIN 2.3* 3.0* 3.4*    No results for input(s): "LIPASE", "AMYLASE" in the last 168 hours. Recent Labs  Lab 06/25/22 0718  AMMONIA 17     Coagulation Profile: No results for input(s): "INR", "PROTIME" in the last 168 hours.  Cardiac Enzymes: No results for input(s): "CKTOTAL", "CKMB", "CKMBINDEX", "TROPONINI" in the last 168  hours.  BNP (last 3 results) No results for input(s): "PROBNP" in the last 8760 hours.  Lipid Profile: No results for input(s): "CHOL", "HDL", "LDLCALC", "TRIG", "CHOLHDL", "LDLDIRECT" in the last 72 hours.  Thyroid Function Tests: No results for input(s): "TSH", "T4TOTAL", "FREET4", "T3FREE", "THYROIDAB" in the last 72 hours.  Anemia Panel: No results for input(s): "VITAMINB12", "FOLATE", "FERRITIN", "TIBC", "IRON", "RETICCTPCT" in the last 72 hours.  Urine analysis:    Component Value Date/Time   COLORURINE YELLOW 01/18/2019 1230   APPEARANCEUR CLEAR 01/18/2019 1230   LABSPEC 1.017 01/18/2019 1230   PHURINE 5.0 01/18/2019 1230   GLUCOSEU NEGATIVE 01/18/2019 1230   HGBUR NEGATIVE 01/18/2019 1230   BILIRUBINUR NEGATIVE 01/18/2019 1230   KETONESUR NEGATIVE 01/18/2019 1230   PROTEINUR 100 (A) 01/18/2019 1230   NITRITE NEGATIVE 01/18/2019 1230   LEUKOCYTESUR NEGATIVE 01/18/2019 1230    Sepsis Labs: Lactic Acid, Venous    Component Value Date/Time   LATICACIDVEN 1.4 06/24/2022 1054    MICROBIOLOGY: Recent Results (from the past 240 hour(s))  Resp panel by RT-PCR (RSV, Flu A&B, Covid) Anterior Nasal Swab     Status: None   Collection Time: 06/24/22  8:52 AM   Specimen: Anterior Nasal Swab  Result Value Ref Range Status   SARS Coronavirus 2 by RT PCR NEGATIVE NEGATIVE Final    Comment: (NOTE) SARS-CoV-2 target nucleic acids are NOT DETECTED.  The SARS-CoV-2 RNA is generally detectable in upper respiratory specimens during the acute phase of infection. The lowest concentration of SARS-CoV-2 viral copies this assay can detect is 138 copies/mL. A negative result does not preclude SARS-Cov-2 infection and should not be used as the sole basis for treatment or other patient management decisions. A negative result may occur with  improper specimen collection/handling, submission of specimen other than nasopharyngeal swab, presence of viral mutation(s) within the areas  targeted by this assay, and inadequate number of viral copies(<138 copies/mL). A negative result must be combined with clinical observations, patient history, and epidemiological information. The expected result is Negative.  Fact Sheet for Patients:  BloggerCourse.com  Fact Sheet for Healthcare Providers:  SeriousBroker.it  This test is no t yet approved or cleared by the [place] FDA and  has been authorized for detection and/or diagnosis of SARS-CoV-2 by FDA under an Emergency Use Authorization (EUA). This EUA will remain  in effect (meaning this test can be used)  for the duration of the COVID-19 declaration under Section 564(b)(1) of the Act, 21 U.S.C.section 360bbb-3(b)(1), unless the authorization is terminated  or revoked sooner.       Influenza A by PCR NEGATIVE NEGATIVE Final   Influenza B by PCR NEGATIVE NEGATIVE Final    Comment: (NOTE) The Xpert Xpress SARS-CoV-2/FLU/RSV plus assay is intended as an aid in the diagnosis of influenza from Nasopharyngeal swab specimens and should not be used as a sole basis for treatment. Nasal washings and aspirates are unacceptable for Xpert Xpress SARS-CoV-2/FLU/RSV testing.  Fact Sheet for Patients: BloggerCourse.com  Fact Sheet for Healthcare Providers: SeriousBroker.it  This test is not yet approved or cleared by the [place] FDA and has been authorized for detection and/or diagnosis of SARS-CoV-2 by FDA under an Emergency Use Authorization (EUA). This EUA will remain in effect (meaning this test can be used) for the duration of the COVID-19 declaration under Section 564(b)(1) of the Act, 21 U.S.C. section 360bbb-3(b)(1), unless the authorization is terminated or revoked.     Resp Syncytial Virus by PCR NEGATIVE NEGATIVE Final    Comment: (NOTE) Fact Sheet for  Patients: BloggerCourse.com  Fact Sheet for Healthcare Providers: SeriousBroker.it  This test is not yet approved or cleared by the [place] FDA and has been authorized for detection and/or diagnosis of SARS-CoV-2 by FDA under an Emergency Use Authorization (EUA). This EUA will remain in effect (meaning this test can be used) for the duration of the COVID-19 declaration under Section 564(b)(1) of the Act, 21 U.S.C. section 360bbb-3(b)(1), unless the authorization is terminated or revoked.  Performed at [facility], [address], [place], [place] [address]   Respiratory (~20 pathogens) panel by PCR     Status: Abnormal   Collection Time: 06/24/22  9:08 AM   Specimen: Nasopharyngeal Swab; Respiratory  Result Value Ref Range Status   Adenovirus NOT DETECTED NOT DETECTED Final   Coronavirus 229E NOT DETECTED NOT DETECTED Final    Comment: (NOTE) The Coronavirus on the Respiratory Panel, DOES NOT test for the novel  Coronavirus (2019 nCoV)    Coronavirus HKU1 NOT DETECTED NOT DETECTED Final   Coronavirus NL63 NOT DETECTED NOT DETECTED Final   Coronavirus OC43 NOT DETECTED NOT DETECTED Final   Metapneumovirus NOT DETECTED NOT DETECTED Final   Rhinovirus / Enterovirus NOT DETECTED NOT DETECTED Final   Influenza A NOT DETECTED NOT DETECTED Final   Influenza B NOT DETECTED NOT DETECTED Final   Parainfluenza Virus 1 NOT DETECTED NOT DETECTED Final   Parainfluenza Virus 2 NOT DETECTED NOT DETECTED Final   Parainfluenza Virus 3 DETECTED (A) NOT DETECTED Final   Parainfluenza Virus 4 NOT DETECTED NOT DETECTED Final   Respiratory Syncytial Virus NOT DETECTED NOT DETECTED Final   Bordetella pertussis NOT DETECTED NOT DETECTED Final   Bordetella Parapertussis NOT DETECTED NOT DETECTED Final   Chlamydophila pneumoniae NOT DETECTED NOT DETECTED Final   Mycoplasma pneumoniae NOT DETECTED NOT DETECTED Final    Comment:  Performed at [facility], [address] [place], [place] [address]    RADIOLOGY STUDIES/RESULTS: DG Chest Port 1 View  Result Date: 06/26/2022 CLINICAL DATA:  Shortness of breath. EXAM: PORTABLE CHEST 1 VIEW COMPARISON:  06/25/2022 FINDINGS: The cardio pericardial silhouette is enlarged. Asymmetric elevation left hemidiaphragm. There is bibasilar atelectasis or infiltrate, similar to prior. No acute bony abnormality. Telemetry leads overlie the chest. IMPRESSION: Bibasilar atelectasis or infiltrate, progressive on the left. Electronically Signed   By: Minerva Areola  Molli Posey M.D.   On: 06/26/2022 08:59   DG Chest Port 1 View  Result Date: 06/25/2022 CLINICAL DATA:  Cough and shortness of breath EXAM: PORTABLE CHEST 1 VIEW COMPARISON:  06/24/2022 FINDINGS: Cardiac shadow is stable. Aortic calcifications are noted. Lungs are well aerated bilaterally. Increasing right basilar airspace opacity is noted consistent with developing infiltrate. No other focal abnormality is noted. IMPRESSION: Developing basilar infiltrate on the right. Electronically Signed   By: Alcide Clever M.D.   On: 06/25/2022 19:26   CT Head Wo Contrast  Result Date: 06/24/2022 CLINICAL DATA:  Cough, weakness, dizziness EXAM: CT HEAD WITHOUT CONTRAST TECHNIQUE: Contiguous axial images were obtained from the base of the skull through the vertex without intravenous contrast. RADIATION DOSE REDUCTION: This exam was performed according to the departmental dose-optimization program which includes automated exposure control, adjustment of the mA and/or kV according to patient size and/or use of iterative reconstruction technique. COMPARISON:  07/13/2021 FINDINGS: Brain: No evidence of acute infarction, hemorrhage, mass, mass effect, or midline shift. No hydrocephalus or extra-axial fluid collection. Periventricular white matter changes, likely the sequela of chronic small vessel ischemic disease. Vascular: No hyperdense vessel. Atherosclerotic  calcifications in the intracranial carotid and vertebral arteries. Skull: Negative for fracture or focal lesion. Sinuses/Orbits: Mild mucosal thickening in the ethmoid air cells. Status post bilateral lens replacements. Other: None. IMPRESSION: No acute intracranial process. Electronically Signed   By: Wiliam Ke M.D.   On: 06/24/2022 14:39     LOS: 2 days   Jeoffrey Massed, MD  [facility]    To contact the attending provider between 7A-7P or the covering provider during after hours 7P-7A, please log into the web site [email or web address] and access using universal [place] password for that web site. If you do not have the password, please call the hospital operator.  06/26/2022, 9:55 AM

## 2022-06-26 NOTE — Evaluation (Signed)
Physical Therapy Evaluation Patient Details Name: Ricky Harris MRN: [id number] DOB: 05-Feb-1941 Today's Date: 06/26/2022  History of Present Illness  Pt is an 82 y.o. male who presented 06/24/22 with cough/weakness and confusion. CT head with no acute intracranial abnormality. Positive for parainfluenza virus 3. Admitted also with acute metabolic encephalopathy likely due to parainfluenza virus infection. PMH: ESRD on HD TTS, afib, AAA, diastolic CHF, HTN, history of liver cirrhosis nonalcoholic, HLD, hypothyroidism, anemia, arthritis, cancer, gout, MI, prediabetes, short-term memory loss   Clinical Impression  Pt presents with condition above and deficits mentioned below, see PT Problem List. PTA, he was mod I using a RW for functional mobility, living with his son and daughter-in-law in a mobile home with 5 STE. Currently, pt displays deficits in strength, balance, activity tolerance, and cognition. He is also limited by symptomatic orthostatic hypotension today, see below. Pt required up to minA to transfer to stand and step between bed and recliner with a RW today, deferring further mobility due to symptomatic orthostatic hypotension. Pending pt's progress, he may benefit from short-term inpatient rehab, <3 hours/day, as he is home alone for a couple hours a day and a high fall risk. If he progresses well he may be able to flip to home with [facility]. Will continue to follow acutely.   BP:  116/54 (72) supine 116/56 (74) sitting 88/52 (60) standing 102/54 (69) sitting after standing 119/53 (72) sitting in recliner 116/54 (72) sitting in recliner several minutes  *reported and displayed lightheadedness with standing       Recommendations for follow up therapy are one component of a multi-disciplinary discharge planning process, led by the attending physician.  Recommendations may be updated based on patient status, additional functional criteria and insurance authorization.  Follow Up  Recommendations Can patient physically be transported by private vehicle: Yes     Assistance Recommended at Discharge Frequent or constant Supervision/Assistance  Patient can return home with the following  A little help with walking and/or transfers;A little help with bathing/dressing/bathroom;Assistance with cooking/housework;Direct supervision/assist for medications management;Direct supervision/assist for financial management;Assist for transportation;Help with stairs or ramp for entrance    Equipment Recommendations Rollator (4 wheels)  Recommendations for Other Services       Functional Status Assessment Patient has had a recent decline in their functional status and demonstrates the ability to make significant improvements in function in a reasonable and predictable amount of time.     Precautions / Restrictions Precautions Precautions: Fall;Other (comment) Precaution Comments: watch BP (orthostatic 4/28) Restrictions Weight Bearing Restrictions: No      Mobility  Bed Mobility Overal bed mobility: Needs Assistance Bed Mobility: Supine to Sit, Sit to Supine     Supine to sit: Min guard, HOB elevated Sit to supine: Min guard, HOB elevated   General bed mobility comments: Extra time, min guard forsafety    Transfers Overall transfer level: Needs assistance Equipment used: Rolling walker (2 wheels) Transfers: Sit to/from Stand, Bed to chair/wheelchair/BSC Sit to Stand: Min assist   Step pivot transfers: Min assist       General transfer comment: Extra time to initiate, min guard-A for balance and safety    Ambulation/Gait Ambulation/Gait assistance: Min assist Gait Distance (Feet): 2 Feet Assistive device: Rolling walker (2 wheels) Gait Pattern/deviations: Step-through pattern, Decreased step length - right, Decreased step length - left, Decreased stride length, Trunk flexed Gait velocity: reduced Gait velocity interpretation: <1.31 ft/sec, indicative of  household ambulator   General Gait Details: minA for balance to  step between bed <> recliner with RW due to noted lightheadedness; deferred further due to symptomatic orthostatic hypotension  Stairs            Wheelchair Mobility    Modified Rankin (Stroke Patients Only)       Balance Overall balance assessment: Needs assistance Sitting-balance support: No upper extremity supported, Feet supported Sitting balance-Leahy Scale: Good     Standing balance support: Bilateral upper extremity supported, During functional activity, Reliant on assistive device for balance Standing balance-Leahy Scale: Poor Standing balance comment: Reliant on RW and up to minA                             Pertinent Vitals/Pain Pain Assessment Pain Assessment: Faces Faces Pain Scale: Hurts a little bit Pain Location: generalized Pain Descriptors / Indicators: Grimacing Pain Intervention(s): Limited activity within patient's tolerance, Monitored during session, Repositioned    Home Living Family/patient expects to be discharged to:: Private residence Living Arrangements: Children (son and daughter-in-law) Available Help at Discharge: Family;Available PRN/intermittently (alone 1-2 hours/day) Type of Home: Mobile home Home Access: Stairs to enter Entrance Stairs-Rails: Right;Left Entrance Stairs-Number of Steps: 5   Home Layout: One level Home Equipment: Agricultural consultant (2 wheels);Shower seat;Wheelchair - manual      Prior Function Prior Level of Function : Independent/Modified Independent             Mobility Comments: Mod I using RW. ADLs Comments: Dresses himself, does not shower much; does not drive; family manages meds and finances     Hand Dominance   Dominant Hand: Right    Extremity/Trunk Assessment   Upper Extremity Assessment Upper Extremity Assessment: Defer to OT evaluation    Lower Extremity Assessment Lower Extremity Assessment: Generalized weakness     Cervical / Trunk Assessment Cervical / Trunk Assessment: Normal  Communication   Communication: HOH  Cognition Arousal/Alertness: Awake/alert Behavior During Therapy: WFL for tasks assessed/performed Overall Cognitive Status: Impaired/Different from baseline Area of Impairment: Attention, Problem solving, Safety/judgement, Awareness                   Current Attention Level: Sustained     Safety/Judgement: Decreased awareness of deficits, Decreased awareness of safety Awareness: Emergent Problem Solving: Slow processing, Requires verbal cues General Comments: Some memory deficits at baseline; pt HOH but appearing to be slow in processingcues. Requested pt to notify therapist if he were lightheaded throughout session but pt not volunteering info even when prompted and when visibly dizzy        General Comments General comments (skin integrity, edema, etc.): BP 116/54 (72) supine, 116/56 (74) sitting, 88/52 (60) standing, 102/54 (69) sitting after standing, 119/53 (72) sitting in recliner, 116/54 (72) sitting in recliner several minutes; reported and displayed lightheadedness with standing    Exercises     Assessment/Plan    PT Assessment Patient needs continued PT services  PT Problem List Decreased strength;Decreased balance;Decreased activity tolerance;Decreased mobility;Decreased coordination;Decreased cognition;Cardiopulmonary status limiting activity       PT Treatment Interventions DME instruction;Gait training;Stair training;Functional mobility training;Therapeutic activities;Therapeutic exercise;Balance training;Neuromuscular re-education;Cognitive remediation;Patient/family education    PT Goals (Current goals can be found in the Care Plan section)  Acute Rehab PT Goals Patient Stated Goal: to feel better PT Goal Formulation: With patient/family Time For Goal Achievement: 07/10/22 Potential to Achieve Goals: Good    Frequency Min 3X/week      Co-evaluation  AM-PAC PT "6 Clicks" Mobility  Outcome Measure Help needed turning from your back to your side while in a flat bed without using bedrails?: A Little Help needed moving from lying on your back to sitting on the side of a flat bed without using bedrails?: A Little Help needed moving to and from a bed to a chair (including a wheelchair)?: A Little Help needed standing up from a chair using your arms (e.g., wheelchair or bedside chair)?: A Little Help needed to walk in hospital room?: Total Help needed climbing 3-5 steps with a railing? : Total 6 Click Score: 14    End of Session Equipment Utilized During Treatment: Gait belt Activity Tolerance: Treatment limited secondary to medical complications (Comment) (symptomatic orthostatic hypotension) Patient left: in bed;with call bell/phone within reach;with bed alarm set;with family/visitor present Nurse Communication: Mobility status;Other (comment) (symptomatic orthostatic hypotension) PT Visit Diagnosis: Unsteadiness on feet (R26.81);Other abnormalities of gait and mobility (R26.89);Muscle weakness (generalized) (M62.81);Difficulty in walking, not elsewhere classified (R26.2);Dizziness and giddiness (R42)    Time: 1610-9604 PT Time Calculation (min) (ACUTE ONLY): 48 min   Charges:   PT Evaluation $PT Eval Moderate Complexity: 1 Mod PT Treatments $Therapeutic Activity: 23-37 mins        Raymond Gurney, PT, DPT Acute Rehabilitation Services  Office: [phone]   Jewel Baize 06/26/2022, 12:34 PM

## 2022-06-27 DIAGNOSIS — G9340 Encephalopathy, unspecified: Secondary | ICD-10-CM | POA: Diagnosis not present

## 2022-06-27 DIAGNOSIS — K7469 Other cirrhosis of liver: Secondary | ICD-10-CM | POA: Diagnosis not present

## 2022-06-27 DIAGNOSIS — D696 Thrombocytopenia, unspecified: Secondary | ICD-10-CM | POA: Diagnosis not present

## 2022-06-27 LAB — CBC
HCT: 28.5 % — ABNORMAL LOW (ref 39.0–52.0)
Hemoglobin: 9.6 g/dL — ABNORMAL LOW (ref 13.0–17.0)
MCH: 34.4 pg — ABNORMAL HIGH (ref 26.0–34.0)
MCHC: 33.7 g/dL (ref 30.0–36.0)
MCV: 102.2 fL — ABNORMAL HIGH (ref 80.0–100.0)
Platelets: 99 10*3/uL — ABNORMAL LOW (ref 150–400)
RBC: 2.79 MIL/uL — ABNORMAL LOW (ref 4.22–5.81)
RDW: 16 % — ABNORMAL HIGH (ref 11.5–15.5)
WBC: 7.2 10*3/uL (ref 4.0–10.5)
nRBC: 0 % (ref 0.0–0.2)

## 2022-06-27 LAB — RENAL FUNCTION PANEL
Albumin: 3.4 g/dL — ABNORMAL LOW (ref 3.5–5.0)
Anion gap: 17 — ABNORMAL HIGH (ref 5–15)
BUN: 47 mg/dL — ABNORMAL HIGH (ref 8–23)
CO2: 25 mmol/L (ref 22–32)
Calcium: 8.4 mg/dL — ABNORMAL LOW (ref 8.9–10.3)
Chloride: 93 mmol/L — ABNORMAL LOW (ref 98–111)
Creatinine, Ser: 8.42 mg/dL — ABNORMAL HIGH (ref 0.61–1.24)
GFR, Estimated: 6 mL/min — ABNORMAL LOW (ref >60)
Glucose, Bld: 113 mg/dL — ABNORMAL HIGH (ref 70–99)
Phosphorus: 3.2 mg/dL (ref 2.5–4.6)
Potassium: 3.8 mmol/L (ref 3.5–5.1)
Sodium: 135 mmol/L (ref 135–145)

## 2022-06-27 MED ORDER — ISOSORBIDE MONONITRATE ER 30 MG PO TB24: 15.0000 mg | ORAL_TABLET | Freq: Every day | ORAL | Status: DC

## 2022-06-27 MED ORDER — CHLORHEXIDINE GLUCONATE CLOTH 2 % EX PADS
6.0000 | MEDICATED_PAD | Freq: Every day | CUTANEOUS | Status: DC
  Administered 2022-06-27 – 2022-06-29 (×3): 6 via TOPICAL

## 2022-06-27 NOTE — Progress Notes (Signed)
PROGRESS NOTE        PATIENT DETAILS Name: Ricky Harris Age: 82 y.o. Sex: male Date of Birth: Mar 29, 1940 Admit Date: 06/24/2022 Admitting Physician Dewayne Shorter Levora Dredge, MD [id number], [name]., MD  Brief Summary: Patient is a 82 y.o.  male with history of ESRD on HD TTS-who presented with cough/weakness and encephalopathy.  See below for further details.  Significant events: 4/26>> cough/weakness/encephalopathy-admit to [facility]. 4/27>> tachycardia/hypotension after HD-coughing spell with suspicion for aspiration-repeat CXR with PNA-midodrine/IVF bolus/IV albumin.  Significant studies: 4/26>> CT head: No acute intracranial abnormality. 4/26>> CXR: No PNA.  Central vascular congestion.  Significant microbiology data: 4/26>> respiratory virus panel: Parainfluenza virus 3 4/26>> COVID/RSV/influenza PCR: Negative 4/27>> CXR: Developing basilar infiltrate on right 4/28>> CXR: Bibasilar infiltrate-progressive on the left.  Procedures: None  Consults: Nephrology  Subjective: " When can I go home" feels overall much better- cough is decreased quite a bit.  Objective: Vitals: Blood pressure (!) 117/59, pulse 70, temperature 98 F (36.7 C), temperature source Oral, resp. rate 20, height 6' (1.829 m), weight 81.2 kg, SpO2 91 %.   Exam: Gen Exam:Alert awake-not in any distress HEENT:atraumatic, normocephalic Chest: B/L clear to auscultation anteriorly CVS:S1S2 regular Abdomen:soft non tender, non distended Extremities:no edema Neurology: Non focal Skin: no rash  Pertinent Labs/Radiology:    Latest Ref Rng & Units 06/27/2022    2:37 AM 06/26/2022    2:14 AM 06/25/2022    3:09 AM  CBC  WBC 4.0 - 10.5 K/uL 7.2  9.2  7.4   Hemoglobin 13.0 - 17.0 g/dL 9.6  9.4  47.8   Hematocrit 39.0 - 52.0 % 28.5  27.7  32.6   Platelets 150 - 400 K/uL 99  109  108     Lab Results  Component Value Date   NA 135 06/27/2022   K 3.8 06/27/2022   CL 93 (L) 06/27/2022    CO2 25 06/27/2022      Assessment/Plan: Acute metabolic encephalopathy Likely due to PNA superimposed on cognitive dysfunction/dementia Much improved-back to baseline. Delirium precautions.  Presumed aspiration PNA Parainfluenza infection Suspicion for an aspiration event 4/27-overall much improved-continue Unasyn/Zithromax   Transient hypotension Suspect this is more related to volume removal with HD in the setting of chronic systolic heart failure than PNA/sepsis physiology. Responded to gentle volume resuscitation-had orthostatic vital signs on 4/28-watch closely.  Will order compression stockings  Fatigue/debility/deconditioning Family reports he is much weaker than his baseline-likely due to acute viral/parainfluenza infection PT/OT recommending SNF-will discuss with social work.  PAF Telemetry monitoring Eliquis Will cautiously resume metoprolol today  CAD-known triple-vessel disease by [facility] 2022 Managed medically-not a candidate for CABG or PCI per last cardiology note No anginal symptoms currently On on Eliquis-Toprol being resumed today-continue to hold Imdur Per last cardiology note-no longer on Plavix.   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  Suspect he does have some amount of dementia at baseline.  Maintain delirium precautions.    Hypothyroidism Levothyroxine  Palliative care DNR in place Thankfully has responded  with supportive care-but family aware that apart from antibiotics and other supportive care-no role for escalation in care.  If he were to deteriorate significantly in spite of current measures-he would be appropriate for hospice care.  BMI: Estimated body mass index is 24.28 kg/m as calculated from the following:   Height as of this encounter: 6' (1.829 m).   Weight as of this encounter: 81.2 kg.   Code status:   Code Status: DNR   DVT Prophylaxis: apixaban (ELIQUIS) tablet 2.5 mg Start: 06/25/22 1000 apixaban (ELIQUIS) tablet 2.5 mg    Family Communication: Son and daughter-in-law at bedside   Disposition Plan: Status is: Observation The patient will require care spanning > 2 midnights and should be moved to inpatient because: Severity of illness   Planned Discharge Destination:Home health   Diet: Diet Order             Diet regular Room service appropriate? Yes with Assist; Fluid consistency: Thin  Diet effective now                     Antimicrobial agents: Anti-infectives (From admission, onward)    Start     Dose/Rate Route Frequency Ordered Stop   06/26/22 1000  azithromycin (ZITHROMAX) tablet 500 mg        500 mg Oral Daily 06/26/22 0656 06/29/22 0959   06/25/22 2100  ampicillin-sulbactam (UNASYN) 1.5 g in sodium chloride 0.9 % 100 mL IVPB        1.5 g 200 mL/hr over 30 Minutes Intravenous Every 12 hours 06/25/22 1948          MEDICATIONS: Scheduled Meds:  apixaban  2.5 mg Oral BID   atorvastatin  40 mg Oral Daily   azithromycin  500 mg Oral Daily   Chlorhexidine Gluconate Cloth  6 each Topical Q0600   donepezil  5 mg Oral QHS   [START ON 06/28/2022] isosorbide mononitrate  15 mg Oral Daily   levothyroxine  50 mcg Oral Daily   metoprolol succinate  12.5 mg Oral Daily   multivitamin  1 tablet Oral Daily   Continuous Infusions:  ampicillin-sulbactam (UNASYN) IV 1.5 g (06/26/22 1950)   PRN Meds:.acetaminophen **OR** acetaminophen, albuterol,  benzonatate, calcium carbonate (dosed in mg elemental calcium), camphor-menthol **AND** hydrOXYzine, docusate sodium, feeding supplement (NEPRO CARB STEADY), ondansetron **OR** ondansetron (ZOFRAN) IV, sorbitol, zolpidem   I have personally reviewed following labs and imaging studies  LABORATORY DATA: CBC: Recent Labs  Lab 06/24/22 0852 06/24/22 0954 06/24/22 2141 06/25/22 0309 06/26/22 0214 06/27/22 0237  WBC 7.0  --  7.3 7.4 9.2 7.2  HGB 10.6* 9.2* 10.5* 10.5* 9.4* 9.6*  HCT 33.3* 27.0* 32.4* 32.6* 27.7* 28.5*  MCV 106.4*  --  105.5* 106.2* 103.0* 102.2*  PLT 111*  --  107* 108* 109* 99*     Basic Metabolic Panel: Recent Labs  Lab 06/24/22 0852 06/24/22 0954 06/24/22 2141 06/25/22 0309 06/26/22 0214 06/27/22 0237  NA 140 140  --  134* 134* 135  K 3.0* 4.2  --  4.9 4.1 3.8  CL 113* 104  --  97* 95* 93*  CO2 18*  --   --  24 27 25   GLUCOSE 82 84  --  140* 101* 113*  BUN 19 23  --  37* 27* 47*  CREATININE 4.43* 5.80* 8.18* 8.61* 6.04* 8.42*  CALCIUM 5.7*  --   --  8.6* 8.3* 8.4*  MG 1.3*  --   --   --   --   --   PHOS  --   --   --   --  3.0 3.2     GFR: Estimated Creatinine Clearance: 7.7 mL/min (A) (by C-G formula based on SCr of 8.42 mg/dL (H)).  Liver Function Tests: Recent Labs  Lab 06/24/22 0852 06/25/22 0309 06/26/22 0214 06/27/22 0237  AST 27 36  --   --   ALT 18 25  --   --   ALKPHOS 106 149*  --   --   BILITOT 0.7 1.4*  --   --   PROT 4.6* 6.6  --   --   ALBUMIN 2.3* 3.0* 3.4* 3.4*    No results for input(s): "LIPASE", "AMYLASE" in the last 168 hours. Recent Labs  Lab 06/25/22 0718  AMMONIA 17     Coagulation Profile: No results for input(s): "INR", "PROTIME" in the last 168 hours.  Cardiac Enzymes: No results for input(s): "CKTOTAL", "CKMB", "CKMBINDEX", "TROPONINI" in the last 168 hours.  BNP (last 3 results) No results for input(s): "PROBNP" in the last 8760 hours.  Lipid Profile: No results for input(s): "CHOL", "HDL",  "LDLCALC", "TRIG", "CHOLHDL", "LDLDIRECT" in the last 72 hours.  Thyroid Function Tests: No results for input(s): "TSH", "T4TOTAL", "FREET4", "T3FREE", "THYROIDAB" in the last 72 hours.  Anemia Panel: No results for input(s): "VITAMINB12", "FOLATE", "FERRITIN", "TIBC", "IRON", "RETICCTPCT" in the last 72 hours.  Urine analysis:    Component Value Date/Time   COLORURINE YELLOW 01/18/2019 1230   APPEARANCEUR CLEAR 01/18/2019 1230   LABSPEC 1.017 01/18/2019 1230   PHURINE 5.0 01/18/2019 1230   GLUCOSEU NEGATIVE 01/18/2019 1230   HGBUR NEGATIVE 01/18/2019 1230   BILIRUBINUR NEGATIVE 01/18/2019 1230   KETONESUR NEGATIVE 01/18/2019 1230   PROTEINUR 100 (A) 01/18/2019 1230   NITRITE NEGATIVE 01/18/2019 1230   LEUKOCYTESUR NEGATIVE 01/18/2019 1230    Sepsis Labs: Lactic Acid, Venous    Component Value Date/Time   LATICACIDVEN 1.4 06/24/2022 1054    MICROBIOLOGY: Recent Results (from the past 240 hour(s))  Resp panel by RT-PCR (RSV, Flu A&B, Covid) Anterior Nasal Swab     Status: None   Collection Time: 06/24/22  8:52 AM   Specimen: Anterior Nasal Swab  Result Value Ref Range Status   SARS Coronavirus 2 by RT PCR NEGATIVE NEGATIVE Final    Comment: (NOTE) SARS-CoV-2 target nucleic acids are NOT DETECTED.  The SARS-CoV-2 RNA is generally detectable in upper respiratory specimens during the acute phase of infection. The lowest concentration of SARS-CoV-2 viral copies this assay can detect is 138 copies/mL. A negative result does not preclude SARS-Cov-2 infection and should not be used as the sole basis for treatment or other patient management decisions. A negative result may occur with  improper specimen collection/handling, submission of specimen other than nasopharyngeal swab, presence of viral mutation(s) within the areas targeted by this assay, and inadequate number of viral copies(<138 copies/mL). A negative result must be combined with clinical observations, patient  history, and epidemiological information. The expected result is Negative.  Fact Sheet for Patients:  BloggerCourse.com  Fact Sheet for Healthcare Providers:  SeriousBroker.it  This test is no t yet approved or cleared by the [place] FDA and  has been authorized for detection and/or diagnosis of SARS-CoV-2 by FDA under an Emergency Use Authorization (EUA). This EUA will remain  in effect (meaning this test can be used) for the duration of  the COVID-19 declaration under Section 564(b)(1) of the Act, 21 U.S.C.section 360bbb-3(b)(1), unless the authorization is terminated  or revoked sooner.       Influenza A by PCR NEGATIVE NEGATIVE Final   Influenza B by PCR NEGATIVE NEGATIVE Final    Comment: (NOTE) The Xpert Xpress SARS-CoV-2/FLU/RSV plus assay is intended as an aid in the diagnosis of influenza from Nasopharyngeal swab specimens and should not be used as a sole basis for treatment. Nasal washings and aspirates are unacceptable for Xpert Xpress SARS-CoV-2/FLU/RSV testing.  Fact Sheet for Patients: BloggerCourse.com  Fact Sheet for Healthcare Providers: SeriousBroker.it  This test is not yet approved or cleared by the [place] FDA and has been authorized for detection and/or diagnosis of SARS-CoV-2 by FDA under an Emergency Use Authorization (EUA). This EUA will remain in effect (meaning this test can be used) for the duration of the COVID-19 declaration under Section 564(b)(1) of the Act, 21 U.S.C. section 360bbb-3(b)(1), unless the authorization is terminated or revoked.     Resp Syncytial Virus by PCR NEGATIVE NEGATIVE Final    Comment: (NOTE) Fact Sheet for Patients: BloggerCourse.com  Fact Sheet for Healthcare Providers: SeriousBroker.it  This test is not yet approved or cleared by the [place] FDA  and has been authorized for detection and/or diagnosis of SARS-CoV-2 by FDA under an Emergency Use Authorization (EUA). This EUA will remain in effect (meaning this test can be used) for the duration of the COVID-19 declaration under Section 564(b)(1) of the Act, 21 U.S.C. section 360bbb-3(b)(1), unless the authorization is terminated or revoked.  Performed at [facility], [address], [place], [place] [address]   Respiratory (~20 pathogens) panel by PCR     Status: Abnormal   Collection Time: 06/24/22  9:08 AM   Specimen: Nasopharyngeal Swab; Respiratory  Result Value Ref Range Status   Adenovirus NOT DETECTED NOT DETECTED Final   Coronavirus 229E NOT DETECTED NOT DETECTED Final    Comment: (NOTE) The Coronavirus on the Respiratory Panel, DOES NOT test for the novel  Coronavirus (2019 nCoV)    Coronavirus HKU1 NOT DETECTED NOT DETECTED Final   Coronavirus NL63 NOT DETECTED NOT DETECTED Final   Coronavirus OC43 NOT DETECTED NOT DETECTED Final   Metapneumovirus NOT DETECTED NOT DETECTED Final   Rhinovirus / Enterovirus NOT DETECTED NOT DETECTED Final   Influenza A NOT DETECTED NOT DETECTED Final   Influenza B NOT DETECTED NOT DETECTED Final   Parainfluenza Virus 1 NOT DETECTED NOT DETECTED Final   Parainfluenza Virus 2 NOT DETECTED NOT DETECTED Final   Parainfluenza Virus 3 DETECTED (A) NOT DETECTED Final   Parainfluenza Virus 4 NOT DETECTED NOT DETECTED Final   Respiratory Syncytial Virus NOT DETECTED NOT DETECTED Final   Bordetella pertussis NOT DETECTED NOT DETECTED Final   Bordetella Parapertussis NOT DETECTED NOT DETECTED Final   Chlamydophila pneumoniae NOT DETECTED NOT DETECTED Final   Mycoplasma pneumoniae NOT DETECTED NOT DETECTED Final    Comment: Performed at [facility], [address] [place], [place] [address]    RADIOLOGY STUDIES/RESULTS: DG Swallowing Func-Speech Pathology  Result Date: 06/26/2022 Table formatting from the  original result was not included. Modified Barium Swallow Study Patient Details Name: Ricky Harris MRN: [id number] Date of Birth: 05-26-1940 Today's Date: 06/26/2022 HPI/PMH: HPI: Patient with multiple medical problems including end-stage renal disease, who completed dialysis yesterday presents with 2 days of weakness, dizziness, cough. Suspected aspiration event with dinner 06/25/22, made NPO, CXR reveals developing RLL infiltrates.  Clinical Impression: Clinical Impression: Mr. Stgermaine demonstrates normal oropharyngeal swallow function. All structures showed adequate strength/ROM for safe and efficient swallow across consistencies without aspiration. Of note, at bedside, pt was having significant coughing across all consistencies, but no coughing occurred during this study. There is potential that his instances of coughing with intake are related to penetration/aspiration/esophageal deficits; however, none were visualized on this study. Suspect aspiration event last night after dialysis was r/t fatigue and/or positioning, given pt/family report of partially reclined in bed and extreme "exhaustion" after receiving dialysis. Pt should only be fed when alert. Age-appropriate differences observed were in mildly reduced base of tongue retraction and epiglottic inversion, which resulted in trace and transient penetration of thin liquids x1 as well as mild vallecular residue after solids. Residue cleared with a spontaneous secondary swallow. Recommend: thin liquids, regular consistency solids, meds as tolerated, upright at 90 degrees during and 30 mins after all PO intake, ONLY FEED WHEN ALERT, SLP service to follow up to ensure tolerance of newly upgraded diet. Factors that may increase risk of adverse event in presence of aspiration Rubye Oaks & Clearance Coots 2021): Factors that may increase risk of adverse event in presence of aspiration Rubye Oaks & Clearance Coots 2021): Frail or deconditioned Recommendations/Plan: Swallowing Evaluation  Recommendations Swallowing Evaluation Recommendations Recommendations: PO diet PO Diet Recommendation: Regular; Thin liquids (Level 0) Liquid Administration via: Cup; Straw Medication Administration: Whole meds with liquid Supervision: Patient able to self-feed; Intermittent supervision/cueing for swallowing strategies Postural changes: Position pt fully upright for meals; Out of bed for meals Oral care recommendations: Oral care BID (2x/day) Treatment Plan Treatment Plan Treatment recommendations: Therapy as outlined in treatment plan below Follow-up recommendations: Other (comment) Recommendations Comment: TBD Functional status assessment: Patient has had a recent decline in their functional status and demonstrates the ability to make significant improvements in function in a reasonable and predictable amount of time. Treatment frequency: Min 1x/week Treatment duration: 1 week Interventions: Diet toleration management by SLP; Aspiration precaution training; Patient/family education Recommendations Recommendations for follow up therapy are one component of a multi-disciplinary discharge planning process, led by the attending physician.  Recommendations may be updated based on patient status, additional functional criteria and insurance authorization. Assessment: Orofacial Exam: Orofacial Exam Oral Cavity: Oral Hygiene: WFL Oral Cavity - Dentition: Adequate natural dentition Orofacial Anatomy: WFL Oral Motor/Sensory Function: WFL Anatomy: Anatomy: WFL Boluses Administered: Boluses Administered Boluses Administered: Thin liquids (Level 0); Mildly thick liquids (Level 2, nectar thick); Puree; Solid  Oral Impairment Domain: Oral Impairment Domain Lip Closure: No labial escape Tongue control during bolus hold: Cohesive bolus between tongue to palatal seal Bolus preparation/mastication: Timely and efficient chewing and mashing Bolus transport/lingual motion: Brisk tongue motion Oral residue: Complete oral clearance  Location of oral residue : N/A Initiation of pharyngeal swallow : Valleculae  Pharyngeal Impairment Domain: Pharyngeal Impairment Domain Soft palate elevation: No bolus between soft palate (SP)/pharyngeal wall (PW) Laryngeal elevation: Complete superior movement of thyroid cartilage with complete approximation of arytenoids to epiglottic petiole Anterior hyoid excursion: Complete anterior movement Epiglottic movement: Complete inversion; Partial inversion Laryngeal vestibule closure: Complete, no air/contrast in laryngeal vestibule Pharyngeal stripping wave : Present - complete Pharyngeal contraction (A/P view only): N/A Pharyngoesophageal segment opening: Complete distension and complete duration, no obstruction of flow Tongue base retraction: Trace column of contrast or air between tongue base and PPW Pharyngeal residue: Trace residue within or on pharyngeal structures; Collection of residue within or on pharyngeal structures Location of pharyngeal residue: Valleculae  Esophageal Impairment Domain: Esophageal Impairment  Domain Esophageal clearance upright position: Complete clearance, esophageal coating Pill: Esophageal Impairment Domain Esophageal clearance upright position: Complete clearance, esophageal coating Penetration/Aspiration Scale Score: Penetration/Aspiration Scale Score 1.  Material does not enter airway: Thin liquids (Level 0); Solid; Puree; Mildly thick liquids (Level 2, nectar thick) 2.  Material enters airway, remains ABOVE vocal cords then ejected out: Thin liquids (Level 0) Compensatory Strategies: Compensatory Strategies Compensatory strategies: No   General Information: No data recorded Diet Prior to this Study: NPO   Temperature : Normal   Respiratory Status: WFL   Supplemental O2: None (Room air)   History of Recent Intubation: No  Behavior/Cognition: Cooperative; Pleasant mood; Alert Self-Feeding Abilities: Able to self-feed Baseline vocal quality/speech: Normal Volitional Cough: Able to  elicit Volitional Swallow: Able to elicit Exam Limitations: No limitations Goal Planning: Prognosis for improved oropharyngeal function: Good Barriers to Reach Goals: Severity of deficits No data recorded Patient/Family Stated Goal: PO intake Consulted and agree with results and recommendations: Patient; Nurse; Family member/caregiver Pain: Pain Assessment Pain Assessment: No/denies pain Faces Pain Scale: 2 Pain Location: generalized Pain Descriptors / Indicators: Grimacing Pain Intervention(s): Limited activity within patient's tolerance End of Session: Start Time:SLP Start Time (ACUTE ONLY): 1100 Stop Time: SLP Stop Time (ACUTE ONLY): 1130 Time Calculation:SLP Time Calculation (min) (ACUTE ONLY): 30 min Charges: SLP Evaluations $ SLP Speech Visit: 1 Visit SLP Evaluations $MBS Swallow: 1 Procedure SLP visit diagnosis: SLP Visit Diagnosis: Dysphagia, unspecified (R13.10) Past Medical History: Past Medical History: Diagnosis Date  A-fib (HCC) 09/18/2020  AAA (abdominal aortic aneurysm) (HCC)   5.3cm , magd by vascular Dr Edilia Bo ,  Anemia   " slightly"  Arthritis   knees  Ascites   Cancer (HCC)   prostate  CHF (congestive heart failure) (HCC)   HFrEF 08/2020  Chronic kidney disease   progressed to ESRD 08/2019  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(HCC)   Coronary artery disease   on CT scan  Gout   H/O hypercholesterolemia   Heart murmur   History of falling   recent- see ct chest results of 04/25/2018  Hypertension   Hypothyroidism   Liver cirrhosis (HCC)   Myocardial infarction (HCC)   NSTEMI 04/25/21, treated medically  Prediabetes   pt denies   Renal artery stenosis (HCC)   Short-term memory loss   Thrombocytopenia (HCC)   Wears glasses  Past Surgical History: Past Surgical History: Procedure Laterality Date  ABDOMINAL AORTIC ENDOVASCULAR STENT GRAFT N/A 01/21/2019  Procedure: ABDOMINAL AORTIC ENDOVASCULAR STENT GRAFT WITH CO2;  Surgeon: Chuck Hint, MD;  Location: [facility] OR;  Service: Vascular;  Laterality:  N/A;  ABDOMINAL AORTOGRAM W/LOWER EXTREMITY Bilateral 12/14/2018  Procedure: ABDOMINAL AORTOGRAM W/LOWER EXTREMITY;  Surgeon: Chuck Hint, MD;  Location: [facility] INVASIVE CV LAB;  Service: Cardiovascular;  Laterality: Bilateral;  AV FISTULA PLACEMENT Left 03/12/2019  Procedure: LEFT ARTERIOVENOUS Arteriovenous FISTULA CREATION.;  Surgeon: Chuck Hint, MD;  Location[facility] OR;  Service: Vascular;  Laterality: Left;  CATARACT EXTRACTION W/ INTRAOCULAR LENS  IMPLANT, BILATERAL    CHOLECYSTECTOMY    ESOPHAGEAL BANDING N/A 08/11/2017  Procedure: ESOPHAGEAL BANDING;  Surgeon: Kathi Der, MD;  Location: [facility] ENDOSCOPY;  Service: Gastroenterology;  Laterality: N/A;  ESOPHAGEAL BANDING N/A 10/26/2017  Procedure: ESOPHAGEAL BANDING;  Surgeon: Kathi Der, MD;  Location: [facility] ENDOSCOPY;  Service: Gastroenterology;  Laterality: N/A;  ESOPHAGEAL BANDING N/A 12/19/2017  Procedure: ESOPHAGEAL BANDING;  Surgeon: Kathi Der, MD;  Location: [facility] ENDOSCOPY;  Service: Gastroenterology;  Laterality: N/A;  ESOPHAGOGASTRODUODENOSCOPY (EGD) WITH  PROPOFOL N/A 07/05/2017  Procedure: ESOPHAGOGASTRODUODENOSCOPY (EGD) WITH PROPOFOL;  Surgeon: Kathi Der, MD;  Location: [facility] ENDOSCOPY;  Service: Gastroenterology;  Laterality: N/A;  ESOPHAGOGASTRODUODENOSCOPY (EGD) WITH PROPOFOL N/A 08/11/2017  Procedure: ESOPHAGOGASTRODUODENOSCOPY (EGD) WITH PROPOFOL;  Surgeon: Kathi Der, MD;  Location: [facility] ENDOSCOPY;  Service: Gastroenterology;  Laterality: N/A;  ESOPHAGOGASTRODUODENOSCOPY (EGD) WITH PROPOFOL N/A 10/26/2017  Procedure: ESOPHAGOGASTRODUODENOSCOPY (EGD) WITH PROPOFOL;  Surgeon: Kathi Der, MD;  Location: [facility] ENDOSCOPY;  Service: Gastroenterology;  Laterality: N/A;  ESOPHAGOGASTRODUODENOSCOPY (EGD) WITH PROPOFOL N/A 12/19/2017  Procedure: ESOPHAGOGASTRODUODENOSCOPY (EGD) WITH PROPOFOL;  Surgeon: Kathi Der, MD;  Location: [facility] ENDOSCOPY;  Service: Gastroenterology;  Laterality: N/A;   ESOPHAGOGASTRODUODENOSCOPY (EGD) WITH PROPOFOL N/A 10/29/2018  Procedure: ESOPHAGOGASTRODUODENOSCOPY (EGD) WITH PROPOFOL;  Surgeon: Kathi Der, MD;  Location: [facility] ENDOSCOPY;  Service: Gastroenterology;  Laterality: N/A;  ESOPHAGOGASTRODUODENOSCOPY (EGD) WITH PROPOFOL N/A 02/14/2020  Procedure: ESOPHAGOGASTRODUODENOSCOPY (EGD) WITH PROPOFOL;  Surgeon: Kathi Der, MD;  Location: [facility] ENDOSCOPY;  Service: Gastroenterology;  Laterality: N/A;  EYE SURGERY    bilateral cataract removal with lens placement  HERNIA REPAIR    IR ANGIOGRAM PULMONARY BILATERAL SELECTIVE  12/03/2021  IR ANGIOGRAM SELECTIVE EACH ADDITIONAL VESSEL  12/03/2021  IR ANGIOGRAM VISCERAL SELECTIVE  12/03/2021  IR ANGIOGRAM VISCERAL SELECTIVE  12/03/2021  IR ANGIOGRAM VISCERAL SELECTIVE  12/03/2021  IR CT SPINE LTD  12/03/2021  IR PARACENTESIS  12/26/2018  IR PARACENTESIS  01/02/2019  IR PARACENTESIS  01/15/2019  IR PARACENTESIS  01/30/2019  IR PARACENTESIS  02/05/2019  IR PARACENTESIS  02/14/2019  IR PARACENTESIS  02/28/2019  IR PARACENTESIS  03/14/2019  IR PARACENTESIS  03/29/2019  IR PARACENTESIS  04/11/2019  IR PARACENTESIS  04/25/2019  IR PARACENTESIS  05/09/2019  IR PARACENTESIS  06/03/2019  IR PARACENTESIS  06/06/2019  IR PARACENTESIS  06/20/2019  IR PARACENTESIS  07/03/2019  IR PARACENTESIS  07/19/2019  IR PARACENTESIS  08/08/2019  IR PARACENTESIS  08/27/2019  IR PARACENTESIS  09/09/2019  IR PARACENTESIS  09/24/2019  IR PARACENTESIS  10/07/2019  IR PARACENTESIS  10/23/2019  IR PARACENTESIS  11/07/2019  IR PARACENTESIS  11/21/2019  IR PARACENTESIS  12/04/2019  IR PARACENTESIS  12/25/2019  IR PARACENTESIS  01/10/2020  IR PARACENTESIS  01/27/2020  IR PARACENTESIS  02/19/2020  IR PARACENTESIS  03/27/2020  IR RADIOLOGIST EVAL & MGMT  11/16/2021  IR TRANSCATH/EMBOLIZ  12/03/2021  IR US GUIDE VASC ACCESS RIGHT  12/03/2021  PROSTATECTOMY    RADIOLOGY WITH ANESTHESIA N/A 12/03/2021  Procedure: Type II Endoleak;  Surgeon: Gilmer Mor, DO;  Location: [facility] OR;  Service:  Anesthesiology;  Laterality: N/A;  RIGHT AND LEFT HEART CATH N/A 09/18/2020  Procedure: RIGHT AND LEFT HEART CATH;  Surgeon: Lennette Bihari, MD;  Location: [facility] INVASIVE CV LAB;  Service: Cardiovascular;  Laterality: N/A;  subtotal gastrectomy   Madison P. Isenhour, M.S., CCC-SLP Speech-Language Pathologist Acute Rehabilitation Services Pager: [phone] Susanne Borders Isenhour 06/26/2022, 11:43 AM   DG Chest Port 1 View  Result Date: 06/26/2022 CLINICAL DATA:  Shortness of breath. EXAM: PORTABLE CHEST 1 VIEW COMPARISON:  06/25/2022 FINDINGS: The cardio pericardial silhouette is enlarged. Asymmetric elevation left hemidiaphragm. There is bibasilar atelectasis or infiltrate, similar to prior. No acute bony abnormality. Telemetry leads overlie the chest. IMPRESSION: Bibasilar atelectasis or infiltrate, progressive on the left. Electronically Signed   By: Kennith Center M.D.   On: 06/26/2022 08:59   DG Chest Port 1 View  Result Date: 06/25/2022 CLINICAL DATA:  Cough and shortness of breath EXAM: PORTABLE CHEST 1 VIEW COMPARISON:  06/24/2022 FINDINGS: Cardiac shadow is stable.  Aortic calcifications are noted. Lungs are well aerated bilaterally. Increasing right basilar airspace opacity is noted consistent with developing infiltrate. No other focal abnormality is noted. IMPRESSION: Developing basilar infiltrate on the right. Electronically Signed   By: Alcide Clever M.D.   On: 06/25/2022 19:26     LOS: 3 days   Jeoffrey Massed, MD  [facility]    To contact the attending provider between 7A-7P or the covering provider during after hours 7P-7A, please log into the web site [email or web address] and access using universal [place] password for that web site. If you do not have the password, please call the hospital operator.  06/27/2022, 8:15 AM

## 2022-06-27 NOTE — Progress Notes (Signed)
Rested quietly.  NAD.  Afebrile.  SBP 110/60.  MAP maintained > 60 mmHg.  Will monitor.

## 2022-06-27 NOTE — TOC Initial Note (Signed)
Transition of Care [facility]) - Initial/Assessment Note    Patient Details  Name: Ricky Harris MRN: [id number] Date of Birth: 08-Aug-1940  Transition of Care [facility]) CM/SW Contact:    Mearl Latin, LCSW Phone Number: 06/27/2022, 12:25 PM  Clinical Narrative:                 CSW received consult for possible SNF at time of discharge. CSW spoke with patient, son, and daughter in law at bedside. Patient had trouble hearing CSW with mask but patient is on droplet precautions. DIL reported that they live with the patient and transport him to dialysis. They reported that he does not want to go to SNF and would like home health services instead. CSW discussed equipment needs and they requested a rollator. Patient has a standard walker, wheelchair, and shower chair at home. CSW requested assistance from [facility] to order rollator to the room and to locate an accepting [facility] agency for [facility]. CSW confirmed PCP and address. Family will transport at discharge. Patient to trial HD here tomorrow. No further questions reported at this time.    Expected Discharge Plan: Home w Home Health Services Barriers to Discharge: Continued Medical Work up   Patient Goals and CMS Choice Patient states their goals for this hospitalization and ongoing recovery are:: Return home CMS Medicare.gov Compare Post Acute Care list provided to:: Patient Choice offered to / list presented to : Patient, Adult Children [place] ownership interest in [facility].provided to:: Patient    Expected Discharge Plan and Services   Discharge Planning Services: CM Consult Post Acute Care Choice: Durable Medical Equipment, Home Health Living arrangements for the past 2 months: Mobile Home                                      Prior Living Arrangements/Services Living arrangements for the past 2 months: Mobile Home Lives with:: Adult Children Patient language and need for interpreter reviewed:: Yes Do you feel  safe going back to the place where you live?: Yes      Need for Family Participation in Patient Care: Yes (Comment) Care giver support system in place?: Yes (comment) Current home services: DME (standard walker, wc, shower chair) Criminal Activity/Legal Involvement Pertinent to Current Situation/Hospitalization: No - Comment as needed  Activities of Daily Living      Permission Sought/Granted Permission sought to share information with : Facility Medical sales representative, Family Supports Permission granted to share information with : Yes, Verbal Permission Granted  Share Information with NAME: Arlys John  Permission granted to share info w AGENCY: HH  Permission granted to share info w Relationship: Son  Permission granted to share info w Contact Information: [phone]  Emotional Assessment Appearance:: Appears stated age Attitude/Demeanor/Rapport:  (Hard of hearing) Affect (typically observed): Accepting, Appropriate Orientation: : Oriented to Self, Oriented to Place, Oriented to  Time, Oriented to Situation (Hard of hearing) Alcohol / Substance Use: Not Applicable Psych Involvement: No (comment)  Admission diagnosis:  Hypocalcemia [E83.51] Hypomagnesemia [E83.42] Encephalopathy acute [G93.40] Acute encephalopathy [G93.40] Acute metabolic encephalopathy [G93.41] Patient Active Problem List   Diagnosis Date Noted   Acute metabolic encephalopathy 06/25/2022   Hypokalemia/hypomagnesia 06/24/2022   Hypomagnesemia 06/24/2022   Acute encephalopathy 06/24/2022   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   Malnutrition of moderate degree 04/27/2021   NSTEMI (non-ST elevated myocardial infarction) (HCC) 04/25/2021   GERD without esophagitis  04/25/2021   Paroxysmal atrial fibrillation (HCC) 04/25/2021   CAP (community acquired pneumonia) 09/17/2020   Bilateral pleural effusion 09/15/2020   Thoracic aortic aneurysm (HCC) 09/15/2020   Elevated troponin 09/15/2020   Chest pain  09/15/2020   Dependence on renal dialysis (HCC) 07/13/2020   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HCC) 07/13/2020   Pure hypercholesterolemia 07/13/2020   Lumbar radiculopathy 05/22/2020   Hypercalcemia 04/25/2020   Fluid overload, unspecified 04/14/2020   Impaired cognition 02/26/2020   End-stage renal disease on hemodialysis (HCC) 02/18/2020   Dyspnea 02/18/2020   Encounter for immunization 11/19/2019   Allergy, unspecified, initial encounter 09/27/2019   Iron deficiency anemia, unspecified 09/27/2019   Secondary hyperparathyroidism of renal origin (HCC) 09/27/2019   Unspecified protein-calorie malnutrition (HCC) 09/27/2019   Diarrhea, unspecified 09/26/2019   Pain, unspecified 09/26/2019   Hyperkalemia 09/23/2019   CKD (chronic kidney disease), stage V (HCC) 09/23/2019   Acute kidney insufficiency 09/23/2019   Pruritus 04/15/2019   Senile purpura (HCC) 04/15/2019   Abdominal aortic aneurysm (AAA) (HCC) 01/21/2019   AAA (abdominal aortic aneurysm) without rupture (HCC) 01/21/2019   Trochanteric bursitis of left hip 12/27/2018   Cirrhosis of liver (HCC) 05/29/2017   Ascites 05/20/2017   Normochromic normocytic anemia 05/20/2017   Essential hypertension 05/20/2017   CKD (chronic kidney disease) stage 3, GFR 30-59 ml/min (HCC) 05/20/2017   HLD (hyperlipidemia) 05/20/2017   Hypothyroidism 05/20/2017   Aortic valve sclerosis 02/23/2017   Coronary artery disease involving native coronary artery of native heart without angina pectoris 02/23/2017   Atherosclerosis of abdominal aorta (HCC) 12/02/2016   Left hip pain 06/13/2016   Senile nuclear sclerosis 04/05/2016   Bilateral chronic knee pain 03/11/2016   Thrombocytopenia (HCC) 11/21/2015   H/O peptic ulcer 05/13/2015   History of prostate cancer 05/13/2015   Vitamin B12 deficiency 05/13/2015   Benign neoplasm of colon 04/01/2015   Degeneration of lumbar intervertebral disc 04/01/2015   Erectile dysfunction 04/01/2015   Gout  04/01/2015   Osteoarthritis 04/01/2015   Prediabetes 04/01/2015   Renal artery stenosis (HCC) 04/01/2015   PCP:  Gordan Payment., MD Pharmacy:   [facility] [phone] Rosalita Levan, [place] - [address] AT [facility] [address] [place] [address] Phone: [phone] Fax: [phone]     Social Determinants of Health (SDOH) Social History: SDOH Screenings   Tobacco Use: Low Risk  (06/24/2022)   SDOH Interventions:     Readmission Risk Interventions    09/22/2020    4:51 PM 02/20/2020   12:01 PM  Readmission Risk Prevention Plan  Transportation Screening Complete Complete  PCP or Specialist Appt within 3-5 Days  Complete  HRI or Home Care Consult  Complete  Social Work Consult for Recovery Care Planning/Counseling  Complete  Palliative Care Screening  Not Applicable  Medication Review Ocean[employer]) Complete Complete  PCP or Specialist appointment within 3-5 days of discharge Complete   HRI or Home Care Consult Complete   SW Recovery Care/Counseling Consult Complete   Palliative Care Screening Complete   Skilled Nursing Facility Not Applicable

## 2022-06-27 NOTE — Progress Notes (Signed)
Physical Therapy Treatment Patient Details Name: Ricky Harris MRN: [id number] DOB: 1940-07-31 Today's Date: 06/27/2022   History of Present Illness Pt is an 82 y.o. male who presented 06/24/22 with cough/weakness and confusion. CT head with no acute intracranial abnormality. Positive for parainfluenza virus 3. Admitted also with acute metabolic encephalopathy likely due to parainfluenza virus infection. PMH: ESRD on HD TTS, afib, AAA, diastolic CHF, HTN, history of liver cirrhosis nonalcoholic, HLD, hypothyroidism, anemia, arthritis, cancer, gout, MI, prediabetes, short-term memory loss    PT Comments    Pt greeted resting in bed, with family at bedside, and agreeable to session. Pt continues to be limited by symptomatic orthostatic hypotension, decreased activity tolerance, impaired balance/postural reactions and weakness. Pt needing up to min A to transfer to stand with RW support and able to progress gait this session with up to mod A to steady with RW support and cues throughout for safety as well as decreased gait speed. Current plan remains appropriate to address deficits and maximize functional independence and decrease caregiver burden. Pt continues to benefit from skilled PT services to progress toward functional mobility goals.   BP readings: Supine: 107/55 HR 88bpm Sitting EOB: 111/62 HR 100bpm  Standing: 84/45 HR 123bpm Standing at 3 mins: 109/69 HR 125bpm  Post gait: 108/55 HR 135 bpm   Recommendations for follow up therapy are one component of a multi-disciplinary discharge planning process, led by the attending physician.  Recommendations may be updated based on patient status, additional functional criteria and insurance authorization.  Follow Up Recommendations  Can patient physically be transported by private vehicle: Yes    Assistance Recommended at Discharge Frequent or constant Supervision/Assistance  Patient can return home with the following A little help with  walking and/or transfers;A little help with bathing/dressing/bathroom;Assistance with cooking/housework;Direct supervision/assist for medications management;Direct supervision/assist for financial management;Assist for transportation;Help with stairs or ramp for entrance   Equipment Recommendations  Rollator (4 wheels)    Recommendations for Other Services       Precautions / Restrictions Precautions Precautions: Fall;Other (comment) Precaution Comments: watch BP (orthostatic 4/29) Restrictions Weight Bearing Restrictions: No     Mobility  Bed Mobility Overal bed mobility: Needs Assistance Bed Mobility: Supine to Sit, Sit to Supine     Supine to sit: Min guard, HOB elevated     General bed mobility comments: Extra time, min guard for safety    Transfers Overall transfer level: Needs assistance Equipment used: Rolling walker (2 wheels), None Transfers: Sit to/from Stand, Bed to chair/wheelchair/BSC Sit to Stand: Min assist Stand pivot transfers: Min guard         General transfer comment: Extra time to initiate, min A to stand inititally progressing to min guard-A for balance and safety sit repetition    Ambulation/Gait Ambulation/Gait assistance: Min assist, Mod assist Gait Distance (Feet): 50 Feet Assistive device: Rolling walker (2 wheels) Gait Pattern/deviations: Step-through pattern, Decreased step length - right, Decreased step length - left, Decreased stride length, Trunk flexed Gait velocity: reduced     General Gait Details: min A to steady, cues throughout for close RW proximity and upright posture and to slow gait speed as pt with tendency for fast pace, some L lateral drift in hall needing assist to correct   Stairs             Wheelchair Mobility    Modified Rankin (Stroke Patients Only)       Balance Overall balance assessment: Needs assistance Sitting-balance support: No upper extremity supported,  Feet supported Sitting balance-Leahy  Scale: Good     Standing balance support: Bilateral upper extremity supported, During functional activity, Reliant on assistive device for balance Standing balance-Leahy Scale: Poor Standing balance comment: Reliant on RW and up to [employer] Arousal/Alertness: Awake/alert Behavior During Therapy: WFL for tasks assessed/performed Overall Cognitive Status: Impaired/Different from baseline Area of Impairment: Attention, Problem solving, Safety/judgement, Awareness                   Current Attention Level: Sustained     Safety/Judgement: Decreased awareness of deficits, Decreased awareness of safety Awareness: Emergent Problem Solving: Slow processing, Requires verbal cues General Comments: Some memory deficits at baseline; pt HOH but appearing to be slow in processing cues. needing prompts to share symptoms of dizziness, despite this PTA asking pt to verbalize symptoms as they come        Exercises Other Exercises Other Exercises: seated marching x20 Other Exercises: standing marching pre-gait x10    General Comments General comments (skin integrity, edema, etc.): BP 107/55 (88) supine with HOB flat, 111/62 (100) sitting EOB, 84/45 (123) standing, 109/69 (125) standing at 3 mins, 108/55 (135) post gait, pt symptomatic in sitting and standing initally through resolving, and intermittently throughout gait      Pertinent Vitals/Pain Pain Assessment Pain Assessment: Faces Faces Pain Scale: Hurts a little bit Pain Location: generalized Pain Descriptors / Indicators: Grimacing Pain Intervention(s): Monitored during session, Limited activity within patient's tolerance, Repositioned    Home Living                          Prior Function            PT Goals (current goals can now be found in the care plan section) Acute Rehab PT Goals Patient Stated Goal: to feel better PT Goal Formulation: With patient/family Time  For Goal Achievement: 07/10/22 Progress towards PT goals: Progressing toward goals    Frequency    Min 3X/week      PT Plan      Co-evaluation              AM-PAC PT "6 Clicks" Mobility   Outcome Measure  Help needed turning from your back to your side while in a flat bed without using bedrails?: A Little Help needed moving from lying on your back to sitting on the side of a flat bed without using bedrails?: A Little Help needed moving to and from a bed to a chair (including a wheelchair)?: A Little Help needed standing up from a chair using your arms (e.g., wheelchair or bedside chair)?: A Little Help needed to walk in hospital room?: A Lot Help needed climbing 3-5 steps with a railing? : Total 6 Click Score: 15    End of Session Equipment Utilized During Treatment: Gait belt Activity Tolerance: Treatment limited secondary to medical complications (Comment);Patient tolerated treatment well (symptomatic orthostatic hypotension) Patient left: with call bell/phone within reach;in chair;with family/visitor present Nurse Communication: Mobility status;Other (comment) (symptomatic orthostatic hypotension) PT Visit Diagnosis: Unsteadiness on feet (R26.81);Other abnormalities of gait and mobility (R26.89);Muscle weakness (generalized) (M62.81);Difficulty in walking, not elsewhere classified (R26.2);Dizziness and giddiness (R42)     Time: 9604-5409 PT Time Calculation (min) (ACUTE ONLY): 31 min  Charges:  $Therapeutic Activity: 23-37 mins  Lenora Boys. PTA Acute Rehabilitation Services Office: [phone]    Catalina Antigua 06/27/2022, 11:42 AM

## 2022-06-27 NOTE — Progress Notes (Signed)
Speech Language Pathology Treatment: Dysphagia  Patient Details Name: Ricky Harris MRN: [id number] DOB: 1940-07-27 Today's Date: 06/27/2022 Time: 4098-1191 SLP Time Calculation (min) (ACUTE ONLY): 13 min  Assessment / Plan / Recommendation Clinical Impression  Pt seen for ongoing dysphagia management.  Pt tolerated puree, regular solid, and thin liquid by straw with no clinical s/s of aspiration. Pt did not exhibit delayed cough today.  Family in room for session.  Reviewed imaging from yesterday's MBSS.  There is a small amount of retention in cervical esophagus, which could be causing coughing observed.  On sequential sip trial there was the appearance of a possible bar with possible outpouching above.  But it did empty from time to time.  If this becomes bothersome consider follow up with OP GI and/or ENT for further assessment of cervical esophagus.  Recommend upright positioning with meals and 30-60 minutes after.  Pt has no further ST needs at this time.  SLP will sign off.  Recommend continuing regular texture diet with thin liquids.    HPI HPI: Patient with multiple medical problems including end-stage renal disease, who completed dialysis yesterday presents with 2 days of weakness, dizziness, cough. Suspected aspiration event with dinner 06/25/22, made NPO, CXR reveals developing RLL infiltrates.      SLP Plan  All goals met;Discharge SLP treatment due to (comment)      Recommendations for follow up therapy are one component of a multi-disciplinary discharge planning process, led by the attending physician.  Recommendations may be updated based on patient status, additional functional criteria and insurance authorization.    Recommendations  Diet recommendations: Regular;Thin liquid Liquids provided via: Cup;Straw Medication Administration: Whole meds with liquid Supervision: Patient able to self feed Compensations: Slow rate;Small sips/bites Postural Changes and/or Swallow  Maneuvers: Seated upright 90 degrees;Upright 30-60 min after meal                  Oral care BID   None Dysphagia, unspecified (R13.10)     All goals met;Discharge SLP treatment due to (comment)     Kerrie Pleasure, MA, CCC-SLP Acute Rehabilitation Services Office: [phone] 06/27/2022, 10:58 AM

## 2022-06-27 NOTE — Progress Notes (Signed)
[facility] Progress Note   Subjective:   Family at bedside. BP better this AM. Pt denies SOB, CP, dizziness, abdominal pain and nausea. +  fatigue. Appetite is improving.   Objective Vitals:   06/26/22 1900 06/26/22 2222 06/27/22 0300 06/27/22 0745  BP: 108/63 (!) 102/46 110/60 (!) 117/59  Pulse:   75 70  Resp:    20  Temp: 98.8 F (37.1 C) 98.6 F (37 C) 98 F (36.7 C)   TempSrc: Oral Oral Oral Oral  SpO2:   100% 91%  Weight:      Height:       Physical Exam General: Alert, elderly male in NAD Heart: RRR, no murmurs, rubs or gallops Lungs: CTA anteriorly, respirations unlabored on RA Abdomen: Soft, +BS Extremities: No edema b/l lower extremities Dialysis Access:  LUE AVF + t/b  Additional Objective Labs: Basic Metabolic Panel: Recent Labs  Lab 06/25/22 0309 06/26/22 0214 06/27/22 0237  NA 134* 134* 135  K 4.9 4.1 3.8  CL 97* 95* 93*  CO2 24 27 25   GLUCOSE 140* 101* 113*  BUN 37* 27* 47*  CREATININE 8.61* 6.04* 8.42*  CALCIUM 8.6* 8.3* 8.4*  PHOS  --  3.0 3.2   Liver Function Tests: Recent Labs  Lab 06/24/22 0852 06/25/22 0309 06/26/22 0214 06/27/22 0237  AST 27 36  --   --   ALT 18 25  --   --   ALKPHOS 106 149*  --   --   BILITOT 0.7 1.4*  --   --   PROT 4.6* 6.6  --   --   ALBUMIN 2.3* 3.0* 3.4* 3.4*   No results for input(s): "LIPASE", "AMYLASE" in the last 168 hours. CBC: Recent Labs  Lab 06/24/22 0852 06/24/22 0954 06/24/22 2141 06/25/22 0309 06/26/22 0214 06/27/22 0237  WBC 7.0  --  7.3 7.4 9.2 7.2  HGB 10.6*   < > 10.5* 10.5* 9.4* 9.6*  HCT 33.3*   < > 32.4* 32.6* 27.7* 28.5*  MCV 106.4*  --  105.5* 106.2* 103.0* 102.2*  PLT 111*  --  107* 108* 109* 99*   < > = values in this interval not displayed.   Blood Culture    Component Value Date/Time   SDES BLOOD RIGHT ARM 10/23/2021 2256   SDES BLOOD LEFT HAND 10/23/2021 2256   SPECREQUEST  10/23/2021 2256    BOTTLES DRAWN AEROBIC AND ANAEROBIC Blood Culture  adequate volume   SPECREQUEST  10/23/2021 2256    BOTTLES DRAWN AEROBIC ONLY Blood Culture adequate volume   CULT  10/23/2021 2256    NO GROWTH 5 DAYS Performed at [facility], [address], [place], [place] [address]    CULT  10/23/2021 2256    NO GROWTH 5 DAYS Performed at [facility], [address], [place], [place] [address]    REPTSTATUS 10/28/2021 FINAL 10/23/2021 2256   REPTSTATUS 10/28/2021 FINAL 10/23/2021 2256    Cardiac Enzymes: No results for input(s): "CKTOTAL", "CKMB", "CKMBINDEX", "TROPONINI" in the last 168 hours. CBG: No results for input(s): "GLUCAP" in the last 168 hours. Iron Studies: No results for input(s): "IRON", "TIBC", "TRANSFERRIN", "FERRITIN" in the last 72 hours. @lablastinr3 @ Studies/Results: DG Swallowing Func-Speech Pathology  Result Date: 06/26/2022 Table formatting from the original result was not included. Modified Barium Swallow Study Patient Details Name: MAXIMUM REILAND MRN: [id number] Date of Birth: 20-Apr-1940 Today's Date: 06/26/2022 HPI/PMH: HPI: Patient with multiple medical problems including end-stage renal disease, who completed  dialysis yesterday presents with 2 days of weakness, dizziness, cough. Suspected aspiration event with dinner 06/25/22, made NPO, CXR reveals developing RLL infiltrates. Clinical Impression: Clinical Impression: Mr. Mccutchan demonstrates normal oropharyngeal swallow function. All structures showed adequate strength/ROM for safe and efficient swallow across consistencies without aspiration. Of note, at bedside, pt was having significant coughing across all consistencies, but no coughing occurred during this study. There is potential that his instances of coughing with intake are related to penetration/aspiration/esophageal deficits; however, none were visualized on this study. Suspect aspiration event last night after dialysis was r/t fatigue and/or positioning, given pt/family report of partially reclined in bed  and extreme "exhaustion" after receiving dialysis. Pt should only be fed when alert. Age-appropriate differences observed were in mildly reduced base of tongue retraction and epiglottic inversion, which resulted in trace and transient penetration of thin liquids x1 as well as mild vallecular residue after solids. Residue cleared with a spontaneous secondary swallow. Recommend: thin liquids, regular consistency solids, meds as tolerated, upright at 90 degrees during and 30 mins after all PO intake, ONLY FEED WHEN ALERT, SLP service to follow up to ensure tolerance of newly upgraded diet. Factors that may increase risk of adverse event in presence of aspiration Rubye Oaks & Clearance Coots 2021): Factors that may increase risk of adverse event in presence of aspiration Rubye Oaks & Clearance Coots 2021): Frail or deconditioned Recommendations/Plan: Swallowing Evaluation Recommendations Swallowing Evaluation Recommendations Recommendations: PO diet PO Diet Recommendation: Regular; Thin liquids (Level 0) Liquid Administration via: Cup; Straw Medication Administration: Whole meds with liquid Supervision: Patient able to self-feed; Intermittent supervision/cueing for swallowing strategies Postural changes: Position pt fully upright for meals; Out of bed for meals Oral care recommendations: Oral care BID (2x/day) Treatment Plan Treatment Plan Treatment recommendations: Therapy as outlined in treatment plan below Follow-up recommendations: Other (comment) Recommendations Comment: TBD Functional status assessment: Patient has had a recent decline in their functional status and demonstrates the ability to make significant improvements in function in a reasonable and predictable amount of time. Treatment frequency: Min 1x/week Treatment duration: 1 week Interventions: Diet toleration management by SLP; Aspiration precaution training; Patient/family education Recommendations Recommendations for follow up therapy are one component of a  multi-disciplinary discharge planning process, led by the attending physician.  Recommendations may be updated based on patient status, additional functional criteria and insurance authorization. Assessment: Orofacial Exam: Orofacial Exam Oral Cavity: Oral Hygiene: WFL Oral Cavity - Dentition: Adequate natural dentition Orofacial Anatomy: WFL Oral Motor/Sensory Function: WFL Anatomy: Anatomy: WFL Boluses Administered: Boluses Administered Boluses Administered: Thin liquids (Level 0); Mildly thick liquids (Level 2, nectar thick); Puree; Solid  Oral Impairment Domain: Oral Impairment Domain Lip Closure: No labial escape Tongue control during bolus hold: Cohesive bolus between tongue to palatal seal Bolus preparation/mastication: Timely and efficient chewing and mashing Bolus transport/lingual motion: Brisk tongue motion Oral residue: Complete oral clearance Location of oral residue : N/A Initiation of pharyngeal swallow : Valleculae  Pharyngeal Impairment Domain: Pharyngeal Impairment Domain Soft palate elevation: No bolus between soft palate (SP)/pharyngeal wall (PW) Laryngeal elevation: Complete superior movement of thyroid cartilage with complete approximation of arytenoids to epiglottic petiole Anterior hyoid excursion: Complete anterior movement Epiglottic movement: Complete inversion; Partial inversion Laryngeal vestibule closure: Complete, no air/contrast in laryngeal vestibule Pharyngeal stripping wave : Present - complete Pharyngeal contraction (A/P view only): N/A Pharyngoesophageal segment opening: Complete distension and complete duration, no obstruction of flow Tongue base retraction: Trace column of contrast or air between tongue base and PPW Pharyngeal residue: Trace residue within  or on pharyngeal structures; Collection of residue within or on pharyngeal structures Location of pharyngeal residue: Valleculae  Esophageal Impairment Domain: Esophageal Impairment Domain Esophageal clearance upright  position: Complete clearance, esophageal coating Pill: Esophageal Impairment Domain Esophageal clearance upright position: Complete clearance, esophageal coating Penetration/Aspiration Scale Score: Penetration/Aspiration Scale Score 1.  Material does not enter airway: Thin liquids (Level 0); Solid; Puree; Mildly thick liquids (Level 2, nectar thick) 2.  Material enters airway, remains ABOVE vocal cords then ejected out: Thin liquids (Level 0) Compensatory Strategies: Compensatory Strategies Compensatory strategies: No   General Information: No data recorded Diet Prior to this Study: NPO   Temperature : Normal   Respiratory Status: WFL   Supplemental O2: None (Room air)   History of Recent Intubation: No  Behavior/Cognition: Cooperative; Pleasant mood; Alert Self-Feeding Abilities: Able to self-feed Baseline vocal quality/speech: Normal Volitional Cough: Able to elicit Volitional Swallow: Able to elicit Exam Limitations: No limitations Goal Planning: Prognosis for improved oropharyngeal function: Good Barriers to Reach Goals: Severity of deficits No data recorded Patient/Family Stated Goal: PO intake Consulted and agree with results and recommendations: Patient; Nurse; Family member/caregiver Pain: Pain Assessment Pain Assessment: No/denies pain Faces Pain Scale: 2 Pain Location: generalized Pain Descriptors / Indicators: Grimacing Pain Intervention(s): Limited activity within patient's tolerance End of Session: Start Time:SLP Start Time (ACUTE ONLY): 1100 Stop Time: SLP Stop Time (ACUTE ONLY): 1130 Time Calculation:SLP Time Calculation (min) (ACUTE ONLY): 30 min Charges: SLP Evaluations $ SLP Speech Visit: 1 Visit SLP Evaluations $MBS Swallow: 1 Procedure SLP visit diagnosis: SLP Visit Diagnosis: Dysphagia, unspecified (R13.10) Past Medical History: Past Medical History: Diagnosis Date  A-fib (HCC) 09/18/2020  AAA (abdominal aortic aneurysm) (HCC)   5.3cm , magd by vascular Dr Edilia Bo ,  Anemia   " slightly"   Arthritis   knees  Ascites   Cancer (HCC)   prostate  CHF (congestive heart failure) (HCC)   HFrEF 08/2020  Chronic kidney disease   progressed to ESRD 08/2019  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(HCC)   Coronary artery disease   on CT scan  Gout   H/O hypercholesterolemia   Heart murmur   History of falling   recent- see ct chest results of 04/25/2018  Hypertension   Hypothyroidism   Liver cirrhosis (HCC)   Myocardial infarction (HCC)   NSTEMI 04/25/21, treated medically  Prediabetes   pt denies   Renal artery stenosis (HCC)   Short-term memory loss   Thrombocytopenia (HCC)   Wears glasses  Past Surgical History: Past Surgical History: Procedure Laterality Date  ABDOMINAL AORTIC ENDOVASCULAR STENT GRAFT N/A 01/21/2019  Procedure: ABDOMINAL AORTIC ENDOVASCULAR STENT GRAFT WITH CO2;  Surgeon: Chuck Hint, MD;  Location: [facility] OR;  Service: Vascular;  Laterality: N/A;  ABDOMINAL AORTOGRAM W/LOWER EXTREMITY Bilateral 12/14/2018  Procedure: ABDOMINAL AORTOGRAM W/LOWER EXTREMITY;  Surgeon: Chuck Hint, MD;  Location: [facility] INVASIVE CV LAB;  Service: Cardiovascular;  Laterality: Bilateral;  AV FISTULA PLACEMENT Left 03/12/2019  Procedure: LEFT ARTERIOVENOUS Arteriovenous FISTULA CREATION.;  Surgeon: Chuck Hint, MD;  Location: [facility] OR;  Service: Vascular;  Laterality: Left;  CATARACT EXTRACTION W/ INTRAOCULAR LENS  IMPLANT, BILATERAL    CHOLECYSTECTOMY    ESOPHAGEAL BANDING N/A 08/11/2017  Procedure: ESOPHAGEAL BANDING;  Surgeon: Kathi Der, MD;  Location: [facility] ENDOSCOPY;  Service: Gastroenterology;  Laterality: N/A;  ESOPHAGEAL BANDING N/A 10/26/2017  Procedure: ESOPHAGEAL BANDING;  Surgeon: Kathi Der, MD;  Location: [facility]COPY;  Service: Gastroenterology;  Laterality: N/A;  ESOPHAGEAL BANDING N/A 12/19/2017  Procedure: ESOPHAGEAL BANDING;  Surgeon: Kathi Der, MD;  Location: [facility];  Service: Gastroenterology;  Laterality: N/A;  ESOPHAGOGASTRODUODENOSCOPY (EGD) WITH  PROPOFOL N/A 07/05/2017  Procedure: ESOPHAGOGASTRODUODENOSCOPY (EGD) WITH PROPOFOL;  Surgeon: Kathi Der, MD;  Location: [facility];  Service: Gastroenterology;  Laterality: N/A;  ESOPHAGOGASTRODUODENOSCOPY (EGD) WITH PROPOFOL N/A 08/11/2017  Procedure: ESOPHAGOGASTRODUODENOSCOPY (EGD) WITH PROPOFOL;  Surgeon: Kathi Der, MD;  Location: [facility];  Service: Gastroenterology;  Laterality: N/A;  ESOPHAGOGASTRODUODENOSCOPY (EGD) WITH PROPOFOL N/A 10/26/2017  Procedure: ESOPHAGOGASTRODUODENOSCOPY (EGD) WITH PROPOFOL;  Surgeon: Kathi Der, MD;  Location: [facility];  Service: Gastroenterology;  Laterality: N/A;  ESOPHAGOGASTRODUODENOSCOPY (EGD) WITH PROPOFOL N/A 12/19/2017  Procedure: ESOPHAGOGASTRODUODENOSCOPY (EGD) WITH PROPOFOL;  Surgeon: Kathi Der, MD;  Location: [facility];  Service: Gastroenterology;  Laterality: N/A;  ESOPHAGOGASTRODUODENOSCOPY (EGD) WITH PROPOFOL N/A 10/29/2018  Procedure: ESOPHAGOGASTRODUODENOSCOPY (EGD) WITH PROPOFOL;  Surgeon: Kathi Der, MD;  Location: [facility];  Service: Gastroenterology;  Laterality: N/A;  ESOPHAGOGASTRODUODENOSCOPY (EGD) WITH PROPOFOL N/A 02/14/2020  Procedure: ESOPHAGOGASTRODUODENOSCOPY (EGD) WITH PROPOFOL;  Surgeon: Kathi Der, MD;  Location: [facility];  Service: Gastroenterology;  Laterality: N/A;  EYE SURGERY    bilateral cataract removal with lens placement  HERNIA REPAIR    IR ANGIOGRAM PULMONARY BILATERAL SELECTIVE  12/03/2021  IR ANGIOGRAM SELECTIVE EACH ADDITIONAL VESSEL  12/03/2021  IR ANGIOGRAM VISCERAL SELECTIVE  12/03/2021  IR ANGIOGRAM VISCERAL SELECTIVE  12/03/2021  IR ANGIOGRAM VISCERAL SELECTIVE  12/03/2021  IR CT SPINE LTD  12/03/2021  IR PARACENTESIS  12/26/2018  IR PARACENTESIS  01/02/2019  IR PARACENTESIS  01/15/2019  IR PARACENTESIS  01/30/2019  IR PARACENTESIS  02/05/2019  IR PARACENTESIS  02/14/2019  IR PARACENTESIS  02/28/2019  IR PARACENTESIS  03/14/2019  IR PARACENTESIS  03/29/2019  IR PARACENTESIS   04/11/2019  IR PARACENTESIS  04/25/2019  IR PARACENTESIS  05/09/2019  IR PARACENTESIS  06/03/2019  IR PARACENTESIS  06/06/2019  IR PARACENTESIS  06/20/2019  IR PARACENTESIS  07/03/2019  IR PARACENTESIS  07/19/2019  IR PARACENTESIS  08/08/2019  IR PARACENTESIS  08/27/2019  IR PARACENTESIS  09/09/2019  IR PARACENTESIS  09/24/2019  IR PARACENTESIS  10/07/2019  IR PARACENTESIS  10/23/2019  IR PARACENTESIS  11/07/2019  IR PARACENTESIS  11/21/2019  IR PARACENTESIS  12/04/2019  IR PARACENTESIS  12/25/2019  IR PARACENTESIS  01/10/2020  IR PARACENTESIS  01/27/2020  IR PARACENTESIS  02/19/2020  IR PARACENTESIS  03/27/2020  IR RADIOLOGIST EVAL & MGMT  11/16/2021  IR TRANSCATH/EMBOLIZ  12/03/2021  IR US GUIDE VASC ACCESS RIGHT  12/03/2021  PROSTATECTOMY    RADIOLOGY WITH ANESTHESIA N/A 12/03/2021  Procedure: Type II Endoleak;  Surgeon: Gilmer Mor, DO;  Location: [facility] OR;  Service: Anesthesiology;  Laterality: N/A;  RIGHT AND LEFT HEART CATH N/A 09/18/2020  Procedure: RIGHT AND LEFT HEART CATH;  Surgeon: Lennette Bihari, MD;  Location: [facility] INVASIVE CV LAB;  Service: Cardiovascular;  Laterality: N/A;  subtotal gastrectomy   Madison P. Isenhour, M.S., CCC-SLP Speech-Language Pathologist [facility] Pager: [phone] Susanne Borders Isenhour 06/26/2022, 11:43 AM   DG Chest Port 1 View  Result Date: 06/26/2022 CLINICAL DATA:  Shortness of breath. EXAM: PORTABLE CHEST 1 VIEW COMPARISON:  06/25/2022 FINDINGS: The cardio pericardial silhouette is enlarged. Asymmetric elevation left hemidiaphragm. There is bibasilar atelectasis or infiltrate, similar to prior. No acute bony abnormality. Telemetry leads overlie the chest. IMPRESSION: Bibasilar atelectasis or infiltrate, progressive on the left. Electronically Signed   By: Kennith Center M.D.   On: 06/26/2022 08:59   DG Chest Port 1 View  Result Date:  06/25/2022 CLINICAL DATA:  Cough and shortness of breath EXAM: PORTABLE CHEST 1 VIEW COMPARISON:  06/24/2022 FINDINGS: Cardiac shadow is stable.  Aortic calcifications are noted. Lungs are well aerated bilaterally. Increasing right basilar airspace opacity is noted consistent with developing infiltrate. No other focal abnormality is noted. IMPRESSION: Developing basilar infiltrate on the right. Electronically Signed   By: Alcide Clever M.D.   On: 06/25/2022 19:26   Medications:  ampicillin-sulbactam (UNASYN) IV 1.5 g (06/27/22 0933)    apixaban  2.5 mg Oral BID   atorvastatin  40 mg Oral Daily   azithromycin  500 mg Oral Daily   Chlorhexidine Gluconate Cloth  6 each Topical Q0600   donepezil  5 mg Oral QHS   levothyroxine  50 mcg Oral Daily   metoprolol succinate  12.5 mg Oral Daily   multivitamin  1 tablet Oral Daily    Outpatient Dialysis Orders: TTS - [facility]  3.75hrs, BFR 350, DFR 500,  EDW 83.5kg, 3K/ 2.5Ca   Access: LU AVF  Heparin none Mircera 50 mcg q2wks - last 4/25 Venofer 100mg  qHD x10 Calcitriol 0.46mcg PO qHD    Assessment/Plan:  Acute metabolic encephalopathy - w/ cognitive dysfunction at baseline. likely 2/2 Parainfluenza Viral infection.  Parainfluenza - per PMD.  Possible aspiration - swallow study completed - moderate aspiration risk.  Additional swallow study to eval esophagus to follow.   Fatigue/weakness/deconditioning - likely 2/2 #2.  Hypocalcemia - Ca 5.7 on admit, improved to 8.3 w/calcium gluconate - this is close to baseline. Corrected calcium at goal today.   ESRD -  On HD TTS.  Next HD 06/28/22.  Hypertension/volume  - BP well controlled. On toprol-Xl 37.5mg  qd and Imdur 15mg  qd outpatient. Metoprolol resumed here at lower dose, imdur on hold. Family reports he has had hypotension with dialysis for the past year. Outpatient records reviewed, hypotension appears to be intermittent but improved after EDW was raised   Anemia of CKD - Hgb 9.6. ESA recently dosed.   Secondary Hyperparathyroidism -  Ca as noted above. Phos in goal. Continue VDRA. Not on binders.   Nutrition - Renal diet w/fluid  restrictions.  Rogers Blocker, PA-C 06/27/2022, 10:06 AM  [facility] Pager: [phone]

## 2022-06-27 NOTE — Plan of Care (Signed)
  Problem: Education: Goal: Knowledge of General Education information will improve Description: Including pain rating scale, medication(s)/side effects and non-pharmacologic comfort measures 06/27/2022 2338 by Bonnita Levan, RN Outcome: Progressing 06/27/2022 2338 by Bonnita Levan, RN Outcome: Progressing   Problem: Health Behavior/Discharge Planning: Goal: Ability to manage health-related needs will improve 06/27/2022 2338 by Bonnita Levan, RN Outcome: Progressing 06/27/2022 2338 by Bonnita Levan, RN Outcome: Progressing   Problem: Clinical Measurements: Goal: Ability to maintain clinical measurements within normal limits will improve 06/27/2022 2338 by Bonnita Levan, RN Outcome: Progressing 06/27/2022 2338 by Bonnita Levan, RN Outcome: Progressing Goal: Will remain free from infection Outcome: Progressing Goal: Diagnostic test results will improve Outcome: Progressing Goal: Respiratory complications will improve Outcome: Progressing Goal: Cardiovascular complication will be avoided Outcome: Progressing   Problem: Activity: Goal: Risk for activity intolerance will decrease Outcome: Progressing   Problem: Nutrition: Goal: Adequate nutrition will be maintained Outcome: Progressing   Problem: Coping: Goal: Level of anxiety will decrease Outcome: Progressing   Problem: Elimination: Goal: Will not experience complications related to bowel motility Outcome: Progressing Goal: Will not experience complications related to urinary retention Outcome: Progressing   Problem: Pain Managment: Goal: General experience of comfort will improve Outcome: Progressing   Problem: Safety: Goal: Ability to remain free from injury will improve Outcome: Progressing   Problem: Skin Integrity: Goal: Risk for impaired skin integrity will decrease Outcome: Progressing

## 2022-06-27 NOTE — Plan of Care (Signed)

## 2022-06-28 DIAGNOSIS — K7469 Other cirrhosis of liver: Secondary | ICD-10-CM | POA: Diagnosis not present

## 2022-06-28 DIAGNOSIS — G9340 Encephalopathy, unspecified: Secondary | ICD-10-CM | POA: Diagnosis not present

## 2022-06-28 DIAGNOSIS — D696 Thrombocytopenia, unspecified: Secondary | ICD-10-CM | POA: Diagnosis not present

## 2022-06-28 NOTE — Progress Notes (Signed)
[facility] Progress Note   Subjective:   Blood pressures pretty good over the past 24 hours. Denies SOB. Cough is improving. Family reports he does get dizziness when standing.   Objective Vitals:   06/28/22 0027 06/28/22 0500 06/28/22 0527 06/28/22 0825  BP: 107/61  (!) 110/59 (!) 109/55  Pulse: 84  74   Resp: 20  20   Temp: 97.6 F (36.4 C)  97.6 F (36.4 C)   TempSrc: Oral  Oral Oral  SpO2:      Weight:  82.1 kg    Height:       Physical Exam General: Alert, elderly male in NAD Heart: RRR, no murmurs, rubs or gallops Lungs: CTA anteriorly, respirations unlabored on RA Abdomen: Soft, +BS Extremities: No edema b/l lower extremities Dialysis Access:  LUE AVF + t/b  Additional Objective Labs: Basic Metabolic Panel: Recent Labs  Lab 06/25/22 0309 06/26/22 0214 06/27/22 0237  NA 134* 134* 135  K 4.9 4.1 3.8  CL 97* 95* 93*  CO2 24 27 25   GLUCOSE 140* 101* 113*  BUN 37* 27* 47*  CREATININE 8.61* 6.04* 8.42*  CALCIUM 8.6* 8.3* 8.4*  PHOS  --  3.0 3.2   Liver Function Tests: Recent Labs  Lab 06/24/22 0852 06/25/22 0309 06/26/22 0214 06/27/22 0237  AST 27 36  --   --   ALT 18 25  --   --   ALKPHOS 106 149*  --   --   BILITOT 0.7 1.4*  --   --   PROT 4.6* 6.6  --   --   ALBUMIN 2.3* 3.0* 3.4* 3.4*   No results for input(s): "LIPASE", "AMYLASE" in the last 168 hours. CBC: Recent Labs  Lab 06/24/22 0852 06/24/22 0954 06/24/22 2141 06/25/22 0309 06/26/22 0214 06/27/22 0237  WBC 7.0  --  7.3 7.4 9.2 7.2  HGB 10.6*   < > 10.5* 10.5* 9.4* 9.6*  HCT 33.3*   < > 32.4* 32.6* 27.7* 28.5*  MCV 106.4*  --  105.5* 106.2* 103.0* 102.2*  PLT 111*  --  107* 108* 109* 99*   < > = values in this interval not displayed.   Blood Culture    Component Value Date/Time   SDES BLOOD RIGHT ARM 10/23/2021 2256   SDES BLOOD LEFT HAND 10/23/2021 2256   SPECREQUEST  10/23/2021 2256    BOTTLES DRAWN AEROBIC AND ANAEROBIC Blood Culture adequate volume    SPECREQUEST  10/23/2021 2256    BOTTLES DRAWN AEROBIC ONLY Blood Culture adequate volume   CULT  10/23/2021 2256    NO GROWTH 5 DAYS Performed at [facility], [address] [place], [place] [address]    CULT  10/23/2021 2256    NO GROWTH 5 DAYS Performed at [facility], [address] [place], [place] [address]    REPTSTATUS 10/28/2021 FINAL 10/23/2021 2256   REPTSTATUS 10/28/2021 FINAL 10/23/2021 2256    Studies/Results: DG Swallowing Func-Speech Pathology  Result Date: 06/26/2022 Table formatting from the original result was not included. Modified Barium Swallow Study Patient Details Name: Ricky [facility] MRN: [id number] Date of Birth: 10-May-1940 Today's Date: 06/26/2022 HPI/PMH: HPI: Patient with multiple medical problems including end-stage renal disease, who completed dialysis yesterday presents with 2 days of weakness, dizziness, cough. Suspected aspiration event with dinner 06/25/22, made NPO, CXR reveals developing RLL infiltrates. Clinical Impression: Clinical Impression: Mr. Cobarrubias demonstrates normal oropharyngeal swallow function. All structures showed adequate strength/ROM for safe and efficient swallow  across consistencies without aspiration. Of note, at bedside, pt was having significant coughing across all consistencies, but no coughing occurred during this study. There is potential that his instances of coughing with intake are related to penetration/aspiration/esophageal deficits; however, none were visualized on this study. Suspect aspiration event last night after dialysis was r/t fatigue and/or positioning, given pt/family report of partially reclined in bed and extreme "exhaustion" after receiving dialysis. Pt should only be fed when alert. Age-appropriate differences observed were in mildly reduced base of tongue retraction and epiglottic inversion, which resulted in trace and transient penetration of thin liquids x1 as well as mild vallecular residue after  solids. Residue cleared with a spontaneous secondary swallow. Recommend: thin liquids, regular consistency solids, meds as tolerated, upright at 90 degrees during and 30 mins after all PO intake, ONLY FEED WHEN ALERT, SLP service to follow up to ensure tolerance of newly upgraded diet. Factors that may increase risk of adverse event in presence of aspiration Rubye Oaks & Clearance Coots 2021): Factors that may increase risk of adverse event in presence of aspiration Rubye Oaks & Clearance Coots 2021): Frail or deconditioned Recommendations/Plan: Swallowing Evaluation Recommendations Swallowing Evaluation Recommendations Recommendations: PO diet PO Diet Recommendation: Regular; Thin liquids (Level 0) Liquid Administration via: Cup; Straw Medication Administration: Whole meds with liquid Supervision: Patient able to self-feed; Intermittent supervision/cueing for swallowing strategies Postural changes: Position pt fully upright for meals; Out of bed for meals Oral care recommendations: Oral care BID (2x/day) Treatment Plan Treatment Plan Treatment recommendations: Therapy as outlined in treatment plan below Follow-up recommendations: Other (comment) Recommendations Comment: TBD Functional status assessment: Patient has had a recent decline in their functional status and demonstrates the ability to make significant improvements in function in a reasonable and predictable amount of time. Treatment frequency: Min 1x/week Treatment duration: 1 week Interventions: Diet toleration management by SLP; Aspiration precaution training; Patient/family education Recommendations Recommendations for follow up therapy are one component of a multi-disciplinary discharge planning process, led by the attending physician.  Recommendations may be updated based on patient status, additional functional criteria and insurance authorization. Assessment: Orofacial Exam: Orofacial Exam Oral Cavity: Oral Hygiene: WFL Oral Cavity - Dentition: Adequate natural dentition  Orofacial Anatomy: WFL Oral Motor/Sensory Function: WFL Anatomy: Anatomy: WFL Boluses Administered: Boluses Administered Boluses Administered: Thin liquids (Level 0); Mildly thick liquids (Level 2, nectar thick); Puree; Solid  Oral Impairment Domain: Oral Impairment Domain Lip Closure: No labial escape Tongue control during bolus hold: Cohesive bolus between tongue to palatal seal Bolus preparation/mastication: Timely and efficient chewing and mashing Bolus transport/lingual motion: Brisk tongue motion Oral residue: Complete oral clearance Location of oral residue : N/A Initiation of pharyngeal swallow : Valleculae  Pharyngeal Impairment Domain: Pharyngeal Impairment Domain Soft palate elevation: No bolus between soft palate (SP)/pharyngeal wall (PW) Laryngeal elevation: Complete superior movement of thyroid cartilage with complete approximation of arytenoids to epiglottic petiole Anterior hyoid excursion: Complete anterior movement Epiglottic movement: Complete inversion; Partial inversion Laryngeal vestibule closure: Complete, no air/contrast in laryngeal vestibule Pharyngeal stripping wave : Present - complete Pharyngeal contraction (A/P view only): N/A Pharyngoesophageal segment opening: Complete distension and complete duration, no obstruction of flow Tongue base retraction: Trace column of contrast or air between tongue base and PPW Pharyngeal residue: Trace residue within or on pharyngeal structures; Collection of residue within or on pharyngeal structures Location of pharyngeal residue: Valleculae  Esophageal Impairment Domain: Esophageal Impairment Domain Esophageal clearance upright position: Complete clearance, esophageal coating Pill: Esophageal Impairment Domain Esophageal clearance upright position: Complete clearance, esophageal coating  Penetration/Aspiration Scale Score: Penetration/Aspiration Scale Score 1.  Material does not enter airway: Thin liquids (Level 0); Solid; Puree; Mildly thick liquids  (Level 2, nectar thick) 2.  Material enters airway, remains ABOVE vocal cords then ejected out: Thin liquids (Level 0) Compensatory Strategies: Compensatory Strategies Compensatory strategies: No   General Information: No data recorded Diet Prior to this Study: NPO   Temperature : Normal   Respiratory Status: WFL   Supplemental O2: None (Room air)   History of Recent Intubation: No  Behavior/Cognition: Cooperative; Pleasant mood; Alert Self-Feeding Abilities: Able to self-feed Baseline vocal quality/speech: Normal Volitional Cough: Able to elicit Volitional Swallow: Able to elicit Exam Limitations: No limitations Goal Planning: Prognosis for improved oropharyngeal function: Good Barriers to Reach Goals: Severity of deficits No data recorded Patient/Family Stated Goal: PO intake Consulted and agree with results and recommendations: Patient; Nurse; Family member/caregiver Pain: Pain Assessment Pain Assessment: No/denies pain Faces Pain Scale: 2 Pain Location: generalized Pain Descriptors / Indicators: Grimacing Pain Intervention(s): Limited activity within patient's tolerance End of Session: Start Time:SLP Start Time (ACUTE ONLY): 1100 Stop Time: SLP Stop Time (ACUTE ONLY): 1130 Time Calculation:SLP Time Calculation (min) (ACUTE ONLY): 30 min Charges: SLP Evaluations $ SLP Speech Visit: 1 Visit SLP Evaluations $MBS Swallow: 1 Procedure SLP visit diagnosis: SLP Visit Diagnosis: Dysphagia, unspecified (R13.10) Past Medical History: Past Medical History: Diagnosis Date  A-fib (HCC) 09/18/2020  AAA (abdominal aortic aneurysm) (HCC)   5.3cm , magd by vascular Dr Edilia Bo ,  Anemia   " slightly"  Arthritis   knees  Ascites   Cancer (HCC)   prostate  CHF (congestive heart failure) (HCC)   HFrEF 08/2020  Chronic kidney disease   progressed to ESRD 08/2019  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(HCC)   Coronary artery disease   on CT scan  Gout   H/O hypercholesterolemia   Heart murmur   History of falling   recent- see ct chest  results of 04/25/2018  Hypertension   Hypothyroidism   Liver cirrhosis (HCC)   Myocardial infarction (HCC)   NSTEMI 04/25/21, treated medically  Prediabetes   pt denies   Renal artery stenosis (HCC)   Short-term memory loss   Thrombocytopenia (HCC)   Wears glasses  Past Surgical History: Past Surgical History: Procedure Laterality Date  ABDOMINAL AORTIC ENDOVASCULAR STENT GRAFT N/A 01/21/2019  Procedure: ABDOMINAL AORTIC ENDOVASCULAR STENT GRAFT WITH CO2;  Surgeon: Chuck Hint, MD;  Location: [facility] OR;  Service: Vascular;  Laterality: N/A;  ABDOMINAL AORTOGRAM W/LOWER EXTREMITY Bilateral 12/14/2018  Procedure: ABDOMINAL AORTOGRAM W/LOWER EXTREMITY;  Surgeon: Chuck Hint, MD;  Location: [facility] INVASIVE CV LAB;  Service: Cardiovascular;  Laterality: Bilateral;  AV FISTULA PLACEMENT Left 03/12/2019  Procedure: LEFT ARTERIOVENOUS Arteriovenous FISTULA CREATION.;  Surgeon: Chuck Hint, MD;  Location: [facility] OR;  Service: Vascular;  Laterality: Left;  CATARACT EXTRACTION W/ INTRAOCULAR LENS  IMPLANT, BILATERAL    CHOLECYSTECTOMY    ESOPHAGEAL BANDING N/A 08/11/2017  Procedure: ESOPHAGEAL BANDING;  Surgeon: Kathi Der, MD;  Location: [facility] ENDOSCOPY;  Service: Gastroenterology;  Laterality: N/A;  ESOPHAGEAL BANDING N/A 10/26/2017  Procedure: ESOPHAGEAL BANDING;  Surgeon: Kathi Der, MD;  Location: [facility];  Service: Gastroenterology;  Laterality: N/A;  ESOPHAGEAL BANDING N/A 12/19/2017  Procedure: ESOPHAGEAL BANDING;  Surgeon: Kathi Der, MD;  Location: [facility];  Service: Gastroenterology;  Laterality: N/A;  ESOPHAGOGASTRODUODENOSCOPY (EGD) WITH PROPOFOL N/A 07/05/2017  Procedure: ESOPHAGOGASTRODUODENOSCOPY (EGD) WITH PROPOFOL;  Surgeon: Kathi Der, MD;  Location: [facility];  Service: Gastroenterology;  Laterality: N/A;  ESOPHAGOGASTRODUODENOSCOPY (EGD) WITH PROPOFOL N/A 08/11/2017  Procedure: ESOPHAGOGASTRODUODENOSCOPY (EGD) WITH PROPOFOL;  Surgeon: Kathi Der,  MD;  Location: [facility];  Service: Gastroenterology;  Laterality: N/A;  ESOPHAGOGASTRODUODENOSCOPY (EGD) WITH PROPOFOL N/A 10/26/2017  Procedure: ESOPHAGOGASTRODUODENOSCOPY (EGD) WITH PROPOFOL;  Surgeon: Kathi Der, MD;  Location: [facility];  Service: Gastroenterology;  Laterality: N/A;  ESOPHAGOGASTRODUODENOSCOPY (EGD) WITH PROPOFOL N/A 12/19/2017  Procedure: ESOPHAGOGASTRODUODENOSCOPY (EGD) WITH PROPOFOL;  Surgeon: Kathi Der, MD;  Location: [facility];  Service: Gastroenterology;  Laterality: N/A;  ESOPHAGOGASTRODUODENOSCOPY (EGD) WITH PROPOFOL N/A 10/29/2018  Procedure: ESOPHAGOGASTRODUODENOSCOPY (EGD) WITH PROPOFOL;  Surgeon: Kathi Der, MD;  Location: [facility];  Service: Gastroenterology;  Laterality: N/A;  ESOPHAGOGASTRODUODENOSCOPY (EGD) WITH PROPOFOL N/A 02/14/2020  Procedure: ESOPHAGOGASTRODUODENOSCOPY (EGD) WITH PROPOFOL;  Surgeon: Kathi Der, MD;  Location: [facility];  Service: Gastroenterology;  Laterality: N/A;  EYE SURGERY    bilateral cataract removal with lens placement  HERNIA REPAIR    IR ANGIOGRAM PULMONARY BILATERAL SELECTIVE  12/03/2021  IR ANGIOGRAM SELECTIVE EACH ADDITIONAL VESSEL  12/03/2021  IR ANGIOGRAM VISCERAL SELECTIVE  12/03/2021  IR ANGIOGRAM VISCERAL SELECTIVE  12/03/2021  IR ANGIOGRAM VISCERAL SELECTIVE  12/03/2021  IR CT SPINE LTD  12/03/2021  IR PARACENTESIS  12/26/2018  IR PARACENTESIS  01/02/2019  IR PARACENTESIS  01/15/2019  IR PARACENTESIS  01/30/2019  IR PARACENTESIS  02/05/2019  IR PARACENTESIS  02/14/2019  IR PARACENTESIS  02/28/2019  IR PARACENTESIS  03/14/2019  IR PARACENTESIS  03/29/2019  IR PARACENTESIS  04/11/2019  IR PARACENTESIS  04/25/2019  IR PARACENTESIS  05/09/2019  IR PARACENTESIS  06/03/2019  IR PARACENTESIS  06/06/2019  IR PARACENTESIS  06/20/2019  IR PARACENTESIS  07/03/2019  IR PARACENTESIS  07/19/2019  IR PARACENTESIS  08/08/2019  IR PARACENTESIS  08/27/2019  IR PARACENTESIS  09/09/2019  IR PARACENTESIS  09/24/2019  IR PARACENTESIS  10/07/2019   IR PARACENTESIS  10/23/2019  IR PARACENTESIS  11/07/2019  IR PARACENTESIS  11/21/2019  IR PARACENTESIS  12/04/2019  IR PARACENTESIS  12/25/2019  IR PARACENTESIS  01/10/2020  IR PARACENTESIS  01/27/2020  IR PARACENTESIS  02/19/2020  IR PARACENTESIS  03/27/2020  IR RADIOLOGIST EVAL & MGMT  11/16/2021  IR TRANSCATH/EMBOLIZ  12/03/2021  IR US GUIDE VASC ACCESS RIGHT  12/03/2021  PROSTATECTOMY    RADIOLOGY WITH ANESTHESIA N/A 12/03/2021  Procedure: Type II Endoleak;  Surgeon: Gilmer Mor, DO;  Location: [facility] OR;  Service: Anesthesiology;  Laterality: N/A;  RIGHT AND LEFT HEART CATH N/A 09/18/2020  Procedure: RIGHT AND LEFT HEART CATH;  Surgeon: Lennette Bihari, MD;  Location: [facility] INVASIVE CV LAB;  Service: Cardiovascular;  Laterality: N/A;  subtotal gastrectomy   Madison P. Isenhour, M.S., CCC-SLP Speech-Language Pathologist Acute Rehabilitation Services Pager: [phone] Susanne Borders Isenhour 06/26/2022, 11:43 AM   Medications:  ampicillin-sulbactam (UNASYN) IV 1.5 g (06/28/22 0906)    apixaban  2.5 mg Oral BID   atorvastatin  40 mg Oral Daily   Chlorhexidine Gluconate Cloth  6 each Topical Q0600   Chlorhexidine Gluconate Cloth  6 each Topical Q0600   donepezil  5 mg Oral QHS   levothyroxine  50 mcg Oral Daily   multivitamin  1 tablet Oral Daily    Outpatient Dialysis Orders: TTS - [facility]  3.75hrs, BFR 350, DFR 500,  EDW 83.5kg, 3K/ 2.5Ca Access: LU AVF  Heparin none Mircera 50 mcg q2wks - last 4/25 Venofer 100mg  qHD x10 Calcitriol 0.3mcg PO qHD   Assessment/Plan:  Acute metabolic encephalopathy - w/ cognitive dysfunction at baseline. likely 2/2 Parainfluenza Viral infection.  Parainfluenza - per PMD.  Possible aspiration - swallow study completed - moderate aspiration risk.  Additional swallow study to eval esophagus to follow.   Fatigue/weakness/deconditioning - likely 2/2 #2.  Hypocalcemia - Ca 5.7 on admit, improved to 8.3 w/calcium gluconate - this is close to baseline. Corrected calcium at goal today.    ESRD -  On HD TTS.  Next HD 06/28/22.  Hypertension/volume  - BP well controlled. On toprol-Xl 37.5mg  qd and Imdur 15mg  qd outpatient. Metoprolol resumed here at lower dose, imdur on hold. Family reports he has had hypotension with dialysis for the past year. Outpatient records reviewed, hypotension appears to be intermittent but improved after EDW was raised   Anemia of CKD - Hgb 9.6. ESA recently dosed.   Secondary Hyperparathyroidism -  Ca as noted above. Phos in goal. Continue VDRA. Not on binders.   Nutrition - Renal diet w/fluid restrictions.  Rogers Blocker, PA-C 06/28/2022, 9:25 AM  [facility] Pager: [phone]

## 2022-06-28 NOTE — Plan of Care (Signed)

## 2022-06-28 NOTE — Progress Notes (Signed)
IV Team to beside per consult. Pt with PIV, IV flushed and blood return noted. PIV in good working condition.

## 2022-06-28 NOTE — Progress Notes (Signed)
Mobility Specialist - Progress Note   06/28/22 1552  Orthostatic Lying   BP- Lying 114/69  Orthostatic Sitting  BP- Sitting 112/74  Orthostatic Standing at 0 minutes  BP- Standing at 0 minutes 115/73  Orthostatic Standing at 3 minutes  BP- Standing at 3 minutes 125/58  Mobility  Activity Stood at bedside  Level of Assistance Minimal assist, patient does 75% or more  Assistive Device Front wheel walker  Activity Response Tolerated well  Mobility Referral Yes  $Mobility charge 1 Mobility   Pt was received in bed and agreeable to orthostatic vitals. Pt c/o of slight dizziness at EOB. No other complaints. Session was limited d/t pt fatigue. Pt was returned to supine with all needs met and family present.  Ricky Harris  Mobility Specialist Please contact via Special educational needs teacher or Rehab office at [phone]

## 2022-06-28 NOTE — Progress Notes (Signed)
PROGRESS NOTE        PATIENT DETAILS Name: Ricky Harris Age: 82 y.o. Sex: male Date of Birth: 07/17/1940 Admit Date: 06/24/2022 Admitting Physician Dewayne Shorter Levora Dredge, MD ZOX:[id number], [name]., MD  Brief Summary: Patient is a 82 y.o.  male with history of ESRD on HD TTS-who presented with cough/weakness and encephalopathy.  See below for further details.  Significant events: 4/26>> cough/weakness/encephalopathy-admit to [facility]. 4/27>> tachycardia/hypotension after HD-coughing spell with suspicion for aspiration-repeat CXR with PNA-midodrine/IVF bolus/IV albumin.  Significant studies: 4/26>> CT head: No acute intracranial abnormality. 4/26>> CXR: No PNA.  Central vascular congestion.  Significant microbiology data: 4/26>> respiratory virus panel: Parainfluenza virus 3 4/26>> COVID/RSV/influenza PCR: Negative 4/27>> CXR: Developing basilar infiltrate on right 4/28>> CXR: Bibasilar infiltrate-progressive on the left.  Procedures: None  Consults: Nephrology  Subjective: Significant improvement in cough over the past several days.  No other complaints. Inquiring about discharge.  Objective: Vitals: Blood pressure (!) 109/55, pulse 84, temperature 97.6 F (36.4 C), temperature source Oral, resp. rate 19, height 6' (1.829 m), weight 82.1 kg, SpO2 97 %.   Exam: Gen Exam:Alert awake-not in any distress HEENT:atraumatic, normocephalic Chest: B/L clear to auscultation anteriorly CVS:S1S2 regular Abdomen:soft non tender, non distended Extremities:no edema Neurology: Non focal Skin: no rash  Pertinent Labs/Radiology:    Latest Ref Rng & Units 06/27/2022    2:37 AM 06/26/2022    2:14 AM 06/25/2022    3:09 AM  CBC  WBC 4.0 - 10.5 K/uL 7.2  9.2  7.4   Hemoglobin 13.0 - 17.0 g/dL 9.6  9.4  40.9   Hematocrit 39.0 - 52.0 % 28.5  27.7  32.6   Platelets 150 - 400 K/uL 99  109  108     Lab Results  Component Value Date   NA 135 06/27/2022   K 3.8  06/27/2022   CL 93 (L) 06/27/2022   CO2 25 06/27/2022      Assessment/Plan: Acute metabolic encephalopathy Likely due to PNA superimposed on cognitive dysfunction/dementia Much improved-back to baseline. Delirium precautions.  Presumed aspiration PNA Parainfluenza infection Suspicion for an aspiration event on 4/27 following a bout of cough Much improved after Unasyn/Zithromax started Hardly any cough-on room air.    Transient hypotension Suspect this is more related to volume removal with HD in the setting of chronic systolic heart failure than PNA/sepsis physiology. Responded to volume resuscitation with IVF/IV albumin and 1 dose of midodrine.  Orthostatic hypotension Imdur on hold Held metoprolol today as will get dialysis Per nephrology note-ongoing issue for the past 1 year Reassess after HD and see how he does.  Fatigue/debility/deconditioning Family reports he is much weaker than his baseline-likely due to acute viral/parainfluenza infection PT/OT recommending SNF-family prefers to take home-Home health ordered.  PAF Telemetry monitoring Eliquis Metoprolol held-see above.  CAD-known triple-vessel disease by [facility] 2022 Managed medically-not a candidate for CABG or PCI per last cardiology note No anginal symptoms currently beta-blocker on hold-see above.   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  Suspect he does have some  amount of dementia at baseline. Maintain delirium precautions.    Hypothyroidism Levothyroxine  Palliative care DNR in place Thankfully has responded with supportive care-but family aware that apart from antibiotics and other supportive care-no role for escalation in care.  If he were to deteriorate significantly in spite of current measures-he would be appropriate for hospice care.  BMI: Estimated body mass index is 24.55 kg/m as calculated from the following:   Height as of this encounter: 6' (1.829 m).   Weight as of this encounter: 82.1 kg.   Code status:   Code Status: DNR   DVT Prophylaxis: Place TED hose Start: 06/27/22 0819 apixaban (ELIQUIS) tablet 2.5 mg Start: 06/25/22 1000 apixaban (ELIQUIS) tablet 2.5 mg    Family Communication: Son and daughter-in-law at bedside   Disposition Plan: Status is: Observation The patient will require care spanning > 2 midnights and should be moved to inpatient because: Severity of illness   Planned Discharge Destination:Home health likely on 5/01   Diet: Diet Order             Diet regular Room service appropriate? Yes with Assist; Fluid consistency: Thin  Diet effective now                     Antimicrobial agents: Anti-infectives (From admission, onward)    Start     Dose/Rate Route Frequency Ordered Stop   06/26/22 1000  azithromycin (ZITHROMAX) tablet 500 mg        500 mg Oral Daily 06/26/22 0656 06/28/22 0907   06/25/22 2100  ampicillin-sulbactam (UNASYN) 1.5 g in sodium chloride 0.9 % 100 mL IVPB        1.5 g 200 mL/hr over 30 Minutes Intravenous Every 12 hours 06/25/22 1948          MEDICATIONS: Scheduled Meds:  apixaban  2.5 mg Oral BID   atorvastatin  40 mg Oral Daily   Chlorhexidine Gluconate Cloth  6 each Topical Q0600   Chlorhexidine Gluconate Cloth  6 each Topical Q0600   donepezil  5 mg Oral QHS   levothyroxine  50 mcg Oral Daily   multivitamin  1 tablet Oral Daily   Continuous  Infusions:  ampicillin-sulbactam (UNASYN) IV 1.5 g (06/28/22 0906)   PRN Meds:.acetaminophen **OR** acetaminophen, albuterol, benzonatate, calcium carbonate (dosed in mg elemental calcium), camphor-menthol **AND** hydrOXYzine, docusate sodium, feeding supplement (NEPRO CARB STEADY), ondansetron **OR** ondansetron (ZOFRAN) IV, sorbitol, zolpidem   I have personally reviewed following labs and imaging studies  LABORATORY DATA: CBC: Recent Labs  Lab 06/24/22 0852 06/24/22 0954 06/24/22 2141 06/25/22 0309 06/26/22 0214 06/27/22 0237  WBC 7.0  --  7.3 7.4 9.2 7.2  HGB 10.6* 9.2* 10.5* 10.5* 9.4* 9.6*  HCT 33.3* 27.0* 32.4* 32.6* 27.7* 28.5*  MCV 106.4*  --  105.5* 106.2* 103.0* 102.2*  PLT 111*  --  107* 108* 109* 99*     Basic Metabolic Panel: Recent Labs  Lab 06/24/22 0852 06/24/22 0954 06/24/22 2141 06/25/22 0309 06/26/22 0214 06/27/22 0237  NA 140 140  --  134* 134* 135  K 3.0* 4.2  --  4.9 4.1 3.8  CL 113* 104  --  97* 95* 93*  CO2 18*  --   --  24 27 25   GLUCOSE 82 84  --  140* 101* 113*  BUN 19 23  --  37*  27* 47*  CREATININE 4.43* 5.80* 8.18* 8.61* 6.04* 8.42*  CALCIUM 5.7*  --   --  8.6* 8.3* 8.4*  MG 1.3*  --   --   --   --   --   PHOS  --   --   --   --  3.0 3.2     GFR: Estimated Creatinine Clearance: 7.7 mL/min (A) (by C-G formula based on SCr of 8.42 mg/dL (H)).  Liver Function Tests: Recent Labs  Lab 06/24/22 0852 06/25/22 0309 06/26/22 0214 06/27/22 0237  AST 27 36  --   --   ALT 18 25  --   --   ALKPHOS 106 149*  --   --   BILITOT 0.7 1.4*  --   --   PROT 4.6* 6.6  --   --   ALBUMIN 2.3* 3.0* 3.4* 3.4*    No results for input(s): "LIPASE", "AMYLASE" in the last 168 hours. Recent Labs  Lab 06/25/22 0718  AMMONIA 17     Coagulation Profile: No results for input(s): "INR", "PROTIME" in the last 168 hours.  Cardiac Enzymes: No results for input(s): "CKTOTAL", "CKMB", "CKMBINDEX", "TROPONINI" in the last 168 hours.  BNP (last 3  results) No results for input(s): "PROBNP" in the last 8760 hours.  Lipid Profile: No results for input(s): "CHOL", "HDL", "LDLCALC", "TRIG", "CHOLHDL", "LDLDIRECT" in the last 72 hours.  Thyroid Function Tests: No results for input(s): "TSH", "T4TOTAL", "FREET4", "T3FREE", "THYROIDAB" in the last 72 hours.  Anemia Panel: No results for input(s): "VITAMINB12", "FOLATE", "FERRITIN", "TIBC", "IRON", "RETICCTPCT" in the last 72 hours.  Urine analysis:    Component Value Date/Time   COLORURINE YELLOW 01/18/2019 1230   APPEARANCEUR CLEAR 01/18/2019 1230   LABSPEC 1.017 01/18/2019 1230   PHURINE 5.0 01/18/2019 1230   GLUCOSEU NEGATIVE 01/18/2019 1230   HGBUR NEGATIVE 01/18/2019 1230   BILIRUBINUR NEGATIVE 01/18/2019 1230   KETONESUR NEGATIVE 01/18/2019 1230   PROTEINUR 100 (A) 01/18/2019 1230   NITRITE NEGATIVE 01/18/2019 1230   LEUKOCYTESUR NEGATIVE 01/18/2019 1230    Sepsis Labs: Lactic Acid, Venous    Component Value Date/Time   LATICACIDVEN 1.4 06/24/2022 1054    MICROBIOLOGY: Recent Results (from the past 240 hour(s))  Resp panel by RT-PCR (RSV, Flu A&B, Covid) Anterior Nasal Swab     Status: None   Collection Time: 06/24/22  8:52 AM   Specimen: Anterior Nasal Swab  Result Value Ref Range Status   SARS Coronavirus 2 by RT PCR NEGATIVE NEGATIVE Final    Comment: (NOTE) SARS-CoV-2 target nucleic acids are NOT DETECTED.  The SARS-CoV-2 RNA is generally detectable in upper respiratory specimens during the acute phase of infection. The lowest concentration of SARS-CoV-2 viral copies this assay can detect is 138 copies/mL. A negative result does not preclude SARS-Cov-2 infection and should not be used as the sole basis for treatment or other patient management decisions. A negative result may occur with  improper specimen collection/handling, submission of specimen other than nasopharyngeal swab, presence of viral mutation(s) within the areas targeted by this assay, and  inadequate number of viral copies(<138 copies/mL). A negative result must be combined with clinical observations, patient history, and epidemiological information. The expected result is Negative.  Fact Sheet for Patients:  BloggerCourse.com  Fact Sheet for Healthcare Providers:  SeriousBroker.it  This test is no t yet approved or cleared by the [place] FDA and  has been authorized for detection and/or diagnosis of SARS-CoV-2 by FDA under an Emergency Use  Authorization (EUA). This EUA will remain  in effect (meaning this test can be used) for the duration of the COVID-19 declaration under Section 564(b)(1) of the Act, 21 U.S.C.section 360bbb-3(b)(1), unless the authorization is terminated  or revoked sooner.       Influenza A by PCR NEGATIVE NEGATIVE Final   Influenza B by PCR NEGATIVE NEGATIVE Final    Comment: (NOTE) The Xpert Xpress SARS-CoV-2/FLU/RSV plus assay is intended as an aid in the diagnosis of influenza from Nasopharyngeal swab specimens and should not be used as a sole basis for treatment. Nasal washings and aspirates are unacceptable for Xpert Xpress SARS-CoV-2/FLU/RSV testing.  Fact Sheet for Patients: BloggerCourse.com  Fact Sheet for Healthcare Providers: SeriousBroker.it  This test is not yet approved or cleared by the [place] FDA and has been authorized for detection and/or diagnosis of SARS-CoV-2 by FDA under an Emergency Use Authorization (EUA). This EUA will remain in effect (meaning this test can be used) for the duration of the COVID-19 declaration under Section 564(b)(1) of the Act, 21 U.S.C. section 360bbb-3(b)(1), unless the authorization is terminated or revoked.     Resp Syncytial Virus by PCR NEGATIVE NEGATIVE Final    Comment: (NOTE) Fact Sheet for Patients: BloggerCourse.com  Fact Sheet for Healthcare  Providers: SeriousBroker.it  This test is not yet approved or cleared by the [place] FDA and has been authorized for detection and/or diagnosis of SARS-CoV-2 by FDA under an Emergency Use Authorization (EUA). This EUA will remain in effect (meaning this test can be used) for the duration of the COVID-19 declaration under Section 564(b)(1) of the Act, 21 U.S.C. section 360bbb-3(b)(1), unless the authorization is terminated or revoked.  Performed at [facility], [address] [place], [place] [address]   Respiratory (~20 pathogens) panel by PCR     Status: Abnormal   Collection Time: 06/24/22  9:08 AM   Specimen: Nasopharyngeal Swab; Respiratory  Result Value Ref Range Status   Adenovirus NOT DETECTED NOT DETECTED Final   Coronavirus 229E NOT DETECTED NOT DETECTED Final    Comment: (NOTE) The Coronavirus on the Respiratory Panel, DOES NOT test for the novel  Coronavirus (2019 nCoV)    Coronavirus HKU1 NOT DETECTED NOT DETECTED Final   Coronavirus NL63 NOT DETECTED NOT DETECTED Final   Coronavirus OC43 NOT DETECTED NOT DETECTED Final   Metapneumovirus NOT DETECTED NOT DETECTED Final   Rhinovirus / Enterovirus NOT DETECTED NOT DETECTED Final   Influenza A NOT DETECTED NOT DETECTED Final   Influenza B NOT DETECTED NOT DETECTED Final   Parainfluenza Virus 1 NOT DETECTED NOT DETECTED Final   Parainfluenza Virus 2 NOT DETECTED NOT DETECTED Final   Parainfluenza Virus 3 DETECTED (A) NOT DETECTED Final   Parainfluenza Virus 4 NOT DETECTED NOT DETECTED Final   Respiratory Syncytial Virus NOT DETECTED NOT DETECTED Final   Bordetella pertussis NOT DETECTED NOT DETECTED Final   Bordetella Parapertussis NOT DETECTED NOT DETECTED Final   Chlamydophila pneumoniae NOT DETECTED NOT DETECTED Final   Mycoplasma pneumoniae NOT DETECTED NOT DETECTED Final    Comment: Performed at [facility], [address], [place], [place] [address]     RADIOLOGY STUDIES/RESULTS: No results found.   LOS: 3 days   Jeoffrey Massed, MD  [facility]    To contact the attending provider between 7A-7P or the covering provider during after hours 7P-7A, please log into the web site [email or web address] and access using universal [place] password for that web site. If  you do not have the password, please call the hospital operator.  06/28/2022, 12:03 PM

## 2022-06-29 ENCOUNTER — Other Ambulatory Visit (HOSPITAL_COMMUNITY): Payer: Self-pay

## 2022-06-29 DIAGNOSIS — G9340 Encephalopathy, unspecified: Secondary | ICD-10-CM | POA: Diagnosis not present

## 2022-06-29 DIAGNOSIS — N186 End stage renal disease: Secondary | ICD-10-CM | POA: Diagnosis not present

## 2022-06-29 DIAGNOSIS — I480 Paroxysmal atrial fibrillation: Secondary | ICD-10-CM | POA: Diagnosis not present

## 2022-06-29 DIAGNOSIS — K7469 Other cirrhosis of liver: Secondary | ICD-10-CM | POA: Diagnosis not present

## 2022-06-29 LAB — CBC
HCT: 26.8 % — ABNORMAL LOW (ref 39.0–52.0)
Hemoglobin: 8.8 g/dL — ABNORMAL LOW (ref 13.0–17.0)
MCH: 33.7 pg (ref 26.0–34.0)
MCHC: 32.8 g/dL (ref 30.0–36.0)
MCV: 102.7 fL — ABNORMAL HIGH (ref 80.0–100.0)
Platelets: 92 10*3/uL — ABNORMAL LOW (ref 150–400)
RBC: 2.61 MIL/uL — ABNORMAL LOW (ref 4.22–5.81)
RDW: 15.2 % (ref 11.5–15.5)
WBC: 6.3 10*3/uL (ref 4.0–10.5)
nRBC: 0 % (ref 0.0–0.2)

## 2022-06-29 LAB — RENAL FUNCTION PANEL
Albumin: 2.8 g/dL — ABNORMAL LOW (ref 3.5–5.0)
Anion gap: 17 — ABNORMAL HIGH (ref 5–15)
BUN: 69 mg/dL — ABNORMAL HIGH (ref 8–23)
CO2: 22 mmol/L (ref 22–32)
Calcium: 8 mg/dL — ABNORMAL LOW (ref 8.9–10.3)
Chloride: 96 mmol/L — ABNORMAL LOW (ref 98–111)
Creatinine, Ser: 11.71 mg/dL — ABNORMAL HIGH (ref 0.61–1.24)
GFR, Estimated: 4 mL/min — ABNORMAL LOW (ref >60)
Glucose, Bld: 96 mg/dL (ref 70–99)
Phosphorus: 4.5 mg/dL (ref 2.5–4.6)
Potassium: 3.8 mmol/L (ref 3.5–5.1)
Sodium: 135 mmol/L (ref 135–145)

## 2022-06-29 MED ORDER — AMOXICILLIN-POT CLAVULANATE 500-125 MG PO TABS
1.0000 | ORAL_TABLET | ORAL | Status: DC
  Administered 2022-06-29: 1 via ORAL
  Filled 2022-06-29: qty 1

## 2022-06-29 MED ORDER — POTASSIUM CHLORIDE 20 MEQ PO PACK: 20.0000 meq | PACK | Freq: Once | ORAL | Status: DC

## 2022-06-29 MED ORDER — AMOXICILLIN-POT CLAVULANATE 500-125 MG PO TABS
1.0000 | ORAL_TABLET | ORAL | 0 refills | Status: AC
  Filled 2022-06-29: qty 2, 2d supply, fill #0

## 2022-06-29 MED ORDER — CHLORHEXIDINE GLUCONATE CLOTH 2 % EX PADS
6.0000 | MEDICATED_PAD | Freq: Every day | CUTANEOUS | Status: DC
  Administered 2022-06-29: 6 via TOPICAL

## 2022-06-29 NOTE — TOC Transition Note (Signed)
Transition of Care [facility]) - CM/SW Discharge Note   Patient Details  Name: Ricky Harris MRN: [id number] Date of Birth: 01-02-1941  Transition of Care [facility]) CM/SW Contact:  Gordy Clement, RN Phone Number: 06/29/2022, 2:04 PM   Clinical Narrative:   Patient will DC to home with Family.  A rollator has been ordered and Christoper Allegra will be delivering to room shortly.  Home Health PT and OT recommended. [facility] will provide [facility] services AVS updated  No additional TOC  needs        Barriers to Discharge: Continued Medical Work up   Patient Goals and CMS Choice CMS Medicare.gov Compare Post Acute Care list provided to:: Patient Choice offered to / list presented to : Patient, Adult Children  Discharge Placement                         Discharge Plan and Services Additional resources added to the After Visit Summary for     Discharge Planning Services: CM Consult Post Acute Care Choice: Durable Medical Equipment, Home Health                               Social Determinants of Health (SDOH) Interventions SDOH Screenings   Tobacco Use: Low Risk  (06/24/2022)     Readmission Risk Interventions    09/22/2020    4:51 PM 02/20/2020   12:01 PM  Readmission Risk Prevention Plan  Transportation Screening Complete Complete  PCP or Specialist Appt within 3-5 Days  Complete  HRI or Home Care Consult  Complete  Social Work Consult for Recovery Care Planning/Counseling  Complete  Palliative Care Screening  Not Applicable  Medication Review Ocean[employer]) Complete Complete  PCP or Specialist appointment within 3-5 days of discharge Complete   HRI or Home Care Consult Complete   SW Recovery Care/Counseling Consult Complete   Palliative Care Screening Complete   Skilled Nursing Facility Not Applicable

## 2022-06-29 NOTE — Progress Notes (Signed)
D/C order noted. Contacted [facility] to advise staff of pt's d/c today and that pt should resume care tomorrow.   Inita Uram Renal Navigator [phone] 

## 2022-06-29 NOTE — Progress Notes (Signed)
OT Cancellation Note  Patient Details Name: Ricky Harris MRN: [id number] DOB: 09-05-1940   Cancelled Treatment:    Reason Eval/Treat Not Completed: Patient at procedure or test/ unavailable (HD. Will attempt later time.)  [facility] 06/29/2022, 8:17 AM Luisa Dago, OT/L   Acute OT Clinical Specialist Acute Rehabilitation Services Pager [phone] Office [phone]

## 2022-06-29 NOTE — Discharge Summary (Signed)
PATIENT DETAILS Name: Ricky Harris Age: 82 y.o. Sex: male Date of Birth: 08/31/1940 MRN: [id number]. Admitting Physician: Maretta Bees, MD NWG:[id number], [name]., MD  Admit Date: 06/24/2022 Discharge date: 06/29/2022  Recommendations for Outpatient Follow-up:  Follow up with PCP in 1-2 weeks Please obtain CMP/CBC in one week Antihypertensives on hold-resume when able.   Admitted From:  Home  Disposition: Home health   Discharge Condition: fair  CODE STATUS:   Code Status: DNR   Diet recommendation:  Diet Order             Diet - low sodium heart healthy           Diet regular Room service appropriate? Yes with Assist; Fluid consistency: Thin  Diet effective now                    Brief Summary: Patient is a 82 y.o.  male with history of ESRD on HD TTS-who presented with cough/weakness and encephalopathy.  See below for further details.   Significant events: 4/26>> cough/weakness/encephalopathy-admit to [facility]. 4/27>> tachycardia/hypotension after HD-coughing spell with suspicion for aspiration-repeat CXR with PNA-midodrine/IVF bolus/IV albumin.   Significant studies: 4/26>> CT head: No acute intracranial abnormality. 4/26>> CXR: No PNA.  Central vascular congestion.   Significant microbiology data: 4/26>> respiratory virus panel: Parainfluenza virus 3 4/26>> COVID/RSV/influenza PCR: Negative 4/27>> CXR: Developing basilar infiltrate on right 4/28>> CXR: Bibasilar infiltrate-progressive on the left.   Procedures: None   Consults: Nephrology  Brief Hospital Course: Acute metabolic encephalopathy Likely due to PNA superimposed on cognitive dysfunction/dementia Much improved-back to baseline. Delirium precautions.   Presumed aspiration PNA Parainfluenza infection Suspicion for an aspiration event on 4/27 following a bout of cough Much improved after Unasyn/Zithromax started. Transitioning to Augmentin 5/1-will continue on discharge-total  antibiotics course-7 days.    Note-cough essentially resolved-on room air for the past several days.   Transient hypotension Suspect this is more related to volume removal with HD in the setting of chronic systolic heart failure than PNA/sepsis physiology. Responded to volume resuscitation with IVF/IV albumin and 1 dose of midodrine.   Orthostatic hypotension All antihypertensives remain on hold-negative orthostatic vital signs on day of discharge. BP remains soft-in the low teens-PCP/primary nephrologist to reassess when antihypertensives can be restarted.   Fatigue/debility/deconditioning Family reports he is much weaker than his baseline-likely due to acute viral/parainfluenza infection PT/OT recommending SNF-family prefers to take home-Home health ordered.   PAF Telemetry monitoring Eliquis Metoprolol hold-see above.   CAD-known triple-vessel disease by [facility] 2022 Managed medically-not a candidate for CABG or PCI per last cardiology note No anginal symptoms currently beta-blocker on hold-see above.    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  Suspect he does have some amount of dementia at baseline. Maintain delirium precautions.     Hypothyroidism Levothyroxine   Palliative care DNR in place Thankfully has responded with supportive care-but family aware that apart from antibiotics and other supportive care-no role for escalation  in care.  If he  were to deteriorate significantly in spite of current measures-he would be appropriate for hospice care.   BMI: Estimated body mass index is 24.55 kg/m as calculated from the following:   Height as of this encounter: 6' (1.829 m).   Weight as of this encounter: 82.1 kg.        Discharge Diagnoses:  Principal Problem:   Acute encephalopathy Active Problems:   Hypokalemia/hypomagnesia   Normochromic normocytic anemia   Essential hypertension   HLD (hyperlipidemia)   Hypothyroidism   AAA (abdominal aortic aneurysm) without rupture (HCC)   End-stage renal disease on hemodialysis (HCC)   Cirrhosis of liver (HCC)   Coronary artery disease involving native coronary artery of native heart without angina pectoris   Gout   Thrombocytopenia (HCC)   Paroxysmal atrial fibrillation (HCC)   Hypomagnesemia   Acute metabolic encephalopathy   Discharge Instructions:  Activity:  As tolerated with Full fall precautions use walker/cane & assistance as needed   Discharge Instructions     Call MD for:  difficulty breathing, headache or visual disturbances   Complete by: As directed    Diet - low sodium heart healthy   Complete by: As directed    Discharge instructions   Complete by: As directed    Follow with Primary MD  Gordan Payment., MD in 1-2 weeks  Please get a complete blood count and chemistry panel checked by your Primary MD at your next visit, and again as instructed by your Primary MD.  Get Medicines reviewed and adjusted: Please take all your medications with you for your next visit with your Primary MD  Laboratory/radiological data: Please request your Primary MD to go over all hospital tests and procedure/radiological results at the follow up, please ask your Primary MD to get all Hospital records sent to his/her office.  In some cases, they will be blood work, cultures and biopsy results pending at the time of your discharge. Please request that your  primary care M.D. follows up on these results.  Also Note the following: If you experience worsening of your admission symptoms, develop shortness of breath, life threatening emergency, suicidal or homicidal thoughts you must seek medical attention immediately by calling 911 or calling your MD immediately  if symptoms less severe.  You must read complete instructions/literature along with all the possible adverse reactions/side effects for all the Medicines you take and that have been prescribed to you. Take any new Medicines after you have completely understood and accpet all the possible adverse reactions/side effects.   Do not drive when taking Pain medications or sleeping medications (Benzodaizepines)  Do not take more than prescribed Pain, Sleep and Anxiety Medications. It is not advisable to combine anxiety,sleep and pain medications without talking with your primary care practitioner  Special Instructions: If you have smoked or chewed Tobacco  in the last 2 yrs please stop smoking, stop any regular Alcohol  and or any Recreational drug use.  Wear Seat belts while driving.  Please note: You were cared for by a hospitalist during your hospital stay. Once you are discharged, your primary care physician will handle any further medical issues. Please note that NO REFILLS for any discharge medications will be authorized once you are discharged, as it is imperative that you return to your primary care physician (or establish a relationship with a primary care physician if you do not have one) for your post hospital discharge needs so that they can reassess your need for medications and monitor your lab  values.   Increase activity slowly   Complete by: As directed       Allergies as of 06/29/2022       Reactions   Cephalexin Hives   Hydralazine Hives        Medication List     STOP taking these medications    isosorbide mononitrate 30 MG 24 hr tablet Commonly known as: IMDUR    metoprolol succinate 25 MG 24 hr tablet Commonly known as: TOPROL-XL   spironolactone 25 MG tablet Commonly known as: ALDACTONE       TAKE these medications    amoxicillin-clavulanate 500-125 MG tablet Commonly known as: AUGMENTIN Take 1 tablet by mouth daily for 2 days. Start taking on: Jun 30, 2022   apixaban 2.5 MG Tabs tablet Commonly known as: ELIQUIS Take 1 tablet (2.5 mg total) by mouth 2 (two) times daily.   atorvastatin 40 MG tablet Commonly known as: LIPITOR Take 1 tablet (40 mg total) by mouth daily. What changed: when to take this   cetirizine 10 MG tablet Commonly known as: ZYRTEC Take 10 mg by mouth daily as needed for allergies.   donepezil 5 MG tablet Commonly known as: ARICEPT Take 5 mg by mouth at bedtime.   levothyroxine 50 MCG tablet Commonly known as: SYNTHROID Take 1 tablet (50 mcg total) by mouth daily.   multivitamin Tabs tablet Take 1 tablet by mouth daily. What changed: when to take this   nitroGLYCERIN 0.4 MG SL tablet Commonly known as: NITROSTAT Place 0.4 mg under the tongue every 5 (five) minutes as needed for chest pain.   ondansetron 4 MG tablet Commonly known as: ZOFRAN Take 4 mg by mouth every 8 (eight) hours as needed for nausea or vomiting.   traMADol 50 MG tablet Commonly known as: ULTRAM Take 1 tablet (50 mg total) by mouth every 8 (eight) hours as needed for moderate pain.        Follow-up Information     Gordan Payment., MD. Schedule an appointment as soon as possible for a visit in 1 week(s).   Specialty: Internal Medicine Contact information: [address] [place] [address] [phone]         Hemodialysis center follow at your usual schedule Follow up.                 Allergies  Allergen Reactions   Cephalexin Hives   Hydralazine Hives     Other Procedures/Studies: DG Swallowing Func-Speech Pathology  Result Date: 06/26/2022 Table formatting from the original result was not  included. Modified Barium Swallow Study Patient Details Name: JABES PRIMO MRN: [id number] Date of Birth: 1940-11-14 Today's Date: 06/26/2022 HPI/PMH: HPI: Patient with multiple medical problems including end-stage renal disease, who completed dialysis yesterday presents with 2 days of weakness, dizziness, cough. Suspected aspiration event with dinner 06/25/22, made NPO, CXR reveals developing RLL infiltrates. Clinical Impression: Clinical Impression: Mr. Haque demonstrates normal oropharyngeal swallow function. All structures showed adequate strength/ROM for safe and efficient swallow across consistencies without aspiration. Of note, at bedside, pt was having significant coughing across all consistencies, but no coughing occurred during this study. There is potential that his instances of coughing with intake are related to penetration/aspiration/esophageal deficits; however, none were visualized on this study. Suspect aspiration event last night after dialysis was r/t fatigue and/or positioning, given pt/family report of partially reclined in bed and extreme "exhaustion" after receiving dialysis. Pt should only be fed when alert. Age-appropriate differences observed were in mildly  reduced base of tongue retraction and epiglottic inversion, which resulted in trace and transient penetration of thin liquids x1 as well as mild vallecular residue after solids. Residue cleared with a spontaneous secondary swallow. Recommend: thin liquids, regular consistency solids, meds as tolerated, upright at 90 degrees during and 30 mins after all PO intake, ONLY FEED WHEN ALERT, SLP service to follow up to ensure tolerance of newly upgraded diet. Factors that may increase risk of adverse event in presence of aspiration Rubye Oaks & Clearance Coots 2021): Factors that may increase risk of adverse event in presence of aspiration Rubye Oaks & Clearance Coots 2021): Frail or deconditioned Recommendations/Plan: Swallowing Evaluation Recommendations  Swallowing Evaluation Recommendations Recommendations: PO diet PO Diet Recommendation: Regular; Thin liquids (Level 0) Liquid Administration via: Cup; Straw Medication Administration: Whole meds with liquid Supervision: Patient able to self-feed; Intermittent supervision/cueing for swallowing strategies Postural changes: Position pt fully upright for meals; Out of bed for meals Oral care recommendations: Oral care BID (2x/day) Treatment Plan Treatment Plan Treatment recommendations: Therapy as outlined in treatment plan below Follow-up recommendations: Other (comment) Recommendations Comment: TBD Functional status assessment: Patient has had a recent decline in their functional status and demonstrates the ability to make significant improvements in function in a reasonable and predictable amount of time. Treatment frequency: Min 1x/week Treatment duration: 1 week Interventions: Diet toleration management by SLP; Aspiration precaution training; Patient/family education Recommendations Recommendations for follow up therapy are one component of a multi-disciplinary discharge planning process, led by the attending physician.  Recommendations may be updated based on patient status, additional functional criteria and insurance authorization. Assessment: Orofacial Exam: Orofacial Exam Oral Cavity: Oral Hygiene: WFL Oral Cavity - Dentition: Adequate natural dentition Orofacial Anatomy: WFL Oral Motor/Sensory Function: WFL Anatomy: Anatomy: WFL Boluses Administered: Boluses Administered Boluses Administered: Thin liquids (Level 0); Mildly thick liquids (Level 2, nectar thick); Puree; Solid  Oral Impairment Domain: Oral Impairment Domain Lip Closure: No labial escape Tongue control during bolus hold: Cohesive bolus between tongue to palatal seal Bolus preparation/mastication: Timely and efficient chewing and mashing Bolus transport/lingual motion: Brisk tongue motion Oral residue: Complete oral clearance Location of oral  residue : N/A Initiation of pharyngeal swallow : Valleculae  Pharyngeal Impairment Domain: Pharyngeal Impairment Domain Soft palate elevation: No bolus between soft palate (SP)/pharyngeal wall (PW) Laryngeal elevation: Complete superior movement of thyroid cartilage with complete approximation of arytenoids to epiglottic petiole Anterior hyoid excursion: Complete anterior movement Epiglottic movement: Complete inversion; Partial inversion Laryngeal vestibule closure: Complete, no air/contrast in laryngeal vestibule Pharyngeal stripping wave : Present - complete Pharyngeal contraction (A/P view only): N/A Pharyngoesophageal segment opening: Complete distension and complete duration, no obstruction of flow Tongue base retraction: Trace column of contrast or air between tongue base and PPW Pharyngeal residue: Trace residue within or on pharyngeal structures; Collection of residue within or on pharyngeal structures Location of pharyngeal residue: Valleculae  Esophageal Impairment Domain: Esophageal Impairment Domain Esophageal clearance upright position: Complete clearance, esophageal coating Pill: Esophageal Impairment Domain Esophageal clearance upright position: Complete clearance, esophageal coating Penetration/Aspiration Scale Score: Penetration/Aspiration Scale Score 1.  Material does not enter airway: Thin liquids (Level 0); Solid; Puree; Mildly thick liquids (Level 2, nectar thick) 2.  Material enters airway, remains ABOVE vocal cords then ejected out: Thin liquids (Level 0) Compensatory Strategies: Compensatory Strategies Compensatory strategies: No   General Information: No data recorded Diet Prior to this Study: NPO   Temperature : Normal   Respiratory Status: WFL   Supplemental O2: None (Room air)   History of Recent  Intubation: No  Behavior/Cognition: Cooperative; Pleasant mood; Alert Self-Feeding Abilities: Able to self-feed Baseline vocal quality/speech: Normal Volitional Cough: Able to elicit Volitional  Swallow: Able to elicit Exam Limitations: No limitations Goal Planning: Prognosis for improved oropharyngeal function: Good Barriers to Reach Goals: Severity of deficits No data recorded Patient/Family Stated Goal: PO intake Consulted and agree with results and recommendations: Patient; Nurse; Family member/caregiver Pain: Pain Assessment Pain Assessment: No/denies pain Faces Pain Scale: 2 Pain Location: generalized Pain Descriptors / Indicators: Grimacing Pain Intervention(s): Limited activity within patient's tolerance End of Session: Start Time:SLP Start Time (ACUTE ONLY): 1100 Stop Time: SLP Stop Time (ACUTE ONLY): 1130 Time Calculation:SLP Time Calculation (min) (ACUTE ONLY): 30 min Charges: SLP Evaluations $ SLP Speech Visit: 1 Visit SLP Evaluations $MBS Swallow: 1 Procedure SLP visit diagnosis: SLP Visit Diagnosis: Dysphagia, unspecified (R13.10) Past Medical History: Past Medical History: Diagnosis Date  A-fib (HCC) 09/18/2020  AAA (abdominal aortic aneurysm) (HCC)   5.3cm , magd by vascular Dr Edilia Bo ,  Anemia   " slightly"  Arthritis   knees  Ascites   Cancer (HCC)   prostate  CHF (congestive heart failure) (HCC)   HFrEF 08/2020  Chronic kidney disease   progressed to ESRD 08/2019  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(HCC)   Coronary artery disease   on CT scan  Gout   H/O hypercholesterolemia   Heart murmur   History of falling   recent- see ct chest results of 04/25/2018  Hypertension   Hypothyroidism   Liver cirrhosis (HCC)   Myocardial infarction (HCC)   NSTEMI 04/25/21, treated medically  Prediabetes   pt denies   Renal artery stenosis (HCC)   Short-term memory loss   Thrombocytopenia (HCC)   Wears glasses  Past Surgical History: Past Surgical History: Procedure Laterality Date  ABDOMINAL AORTIC ENDOVASCULAR STENT GRAFT N/A 01/21/2019  Procedure: ABDOMINAL AORTIC ENDOVASCULAR STENT GRAFT WITH CO2;  Surgeon: Chuck Hint, MD;  Location: [facility] OR;  Service: Vascular;  Laterality: N/A;  ABDOMINAL  AORTOGRAM W/LOWER EXTREMITY Bilateral 12/14/2018  Procedure: ABDOMINAL AORTOGRAM W/LOWER EXTREMITY;  Surgeon: Chuck Hint, MD;  Location: [facility] INVASIVE CV LAB;  Service: Cardiovascular;  Laterality: Bilateral;  AV FISTULA PLACEMENT Left 03/12/2019  Procedure: LEFT ARTERIOVENOUS Arteriovenous FISTULA CREATION.;  Surgeon: Chuck Hint, MD;  Location: [facility] OR;  Service: Vascular;  Laterality: Left;  CATARACT EXTRACTION W/ INTRAOCULAR LENS  IMPLANT, BILATERAL    CHOLECYSTECTOMY    ESOPHAGEAL BANDING N/A 08/11/2017  Procedure: ESOPHAGEAL BANDING;  Surgeon: Kathi Der, MD;  Location: [facility] ENDOSCOPY;  Service: Gastroenterology;  Laterality: N/A;  ESOPHAGEAL BANDING N/A 10/26/2017  Procedure: ESOPHAGEAL BANDING;  Surgeon: Kathi Der, MD;  Location: [facility] ENDOSCOPY;  Service: Gastroenterology;  Laterality: N/A;  ESOPHAGEAL BANDING N/A 12/19/2017  Procedure: ESOPHAGEAL BANDING;  Surgeon: Kathi Der, MD;  Location: [facility] ENDOSCOPY;  Service: Gastroenterology;  Laterality: N/A;  ESOPHAGOGASTRODUODENOSCOPY (EGD) WITH PROPOFOL N/A 07/05/2017  Procedure: ESOPHAGOGASTRODUODENOSCOPY (EGD) WITH PROPOFOL;  Surgeon: Kathi Der, MD;  Location: [facility] ENDOSCOPY;  Service: Gastroenterology;  Laterality: N/A;  ESOPHAGOGASTRODUODENOSCOPY (EGD) WITH PROPOFOL N/A 08/11/2017  Procedure: ESOPHAGOGASTRODUODENOSCOPY (EGD) WITH PROPOFOL;  Surgeon: Kathi Der, MD;  Location: [facility] ENDOSCOPY;  Service: Gastroenterology;  Laterality: N/A;  ESOPHAGOGASTRODUODENOSCOPY (EGD) WITH PROPOFOL N/A 10/26/2017  Procedure: ESOPHAGOGASTRODUODENOSCOPY (EGD) WITH PROPOFOL;  Surgeon: Kathi Der, MD;  Location: [facility]COPY;  Service: Gastroenterology;  Laterality: N/A;  ESOPHAGOGASTRODUODENOSCOPY (EGD) WITH PROPOFOL N/A 12/19/2017  Procedure: ESOPHAGOGASTRODUODENOSCOPY (EGD) WITH PROPOFOL;  Surgeon: Kathi Der, MD;  Location: [facility]COPY;  Service: Gastroenterology;  Laterality: N/A;  ESOPHAGOGASTRODUODENOSCOPY (EGD)  WITH PROPOFOL N/A 10/29/2018  Procedure: ESOPHAGOGASTRODUODENOSCOPY (EGD) WITH PROPOFOL;  Surgeon: Kathi Der, MD;  Location: [facility];  Service: Gastroenterology;  Laterality: N/A;  ESOPHAGOGASTRODUODENOSCOPY (EGD) WITH PROPOFOL N/A 02/14/2020  Procedure: ESOPHAGOGASTRODUODENOSCOPY (EGD) WITH PROPOFOL;  Surgeon: Kathi Der, MD;  Location: [facility] ENDOSCOPY;  Service: Gastroenterology;  Laterality: N/A;  EYE SURGERY    bilateral cataract removal with lens placement  HERNIA REPAIR    IR ANGIOGRAM PULMONARY BILATERAL SELECTIVE  12/03/2021  IR ANGIOGRAM SELECTIVE EACH ADDITIONAL VESSEL  12/03/2021  IR ANGIOGRAM VISCERAL SELECTIVE  12/03/2021  IR ANGIOGRAM VISCERAL SELECTIVE  12/03/2021  IR ANGIOGRAM VISCERAL SELECTIVE  12/03/2021  IR CT SPINE LTD  12/03/2021  IR PARACENTESIS  12/26/2018  IR PARACENTESIS  01/02/2019  IR PARACENTESIS  01/15/2019  IR PARACENTESIS  01/30/2019  IR PARACENTESIS  02/05/2019  IR PARACENTESIS  02/14/2019  IR PARACENTESIS  02/28/2019  IR PARACENTESIS  03/14/2019  IR PARACENTESIS  03/29/2019  IR PARACENTESIS  04/11/2019  IR PARACENTESIS  04/25/2019  IR PARACENTESIS  05/09/2019  IR PARACENTESIS  06/03/2019  IR PARACENTESIS  06/06/2019  IR PARACENTESIS  06/20/2019  IR PARACENTESIS  07/03/2019  IR PARACENTESIS  07/19/2019  IR PARACENTESIS  08/08/2019  IR PARACENTESIS  08/27/2019  IR PARACENTESIS  09/09/2019  IR PARACENTESIS  09/24/2019  IR PARACENTESIS  10/07/2019  IR PARACENTESIS  10/23/2019  IR PARACENTESIS  11/07/2019  IR PARACENTESIS  11/21/2019  IR PARACENTESIS  12/04/2019  IR PARACENTESIS  12/25/2019  IR PARACENTESIS  01/10/2020  IR PARACENTESIS  01/27/2020  IR PARACENTESIS  02/19/2020  IR PARACENTESIS  03/27/2020  IR RADIOLOGIST EVAL & MGMT  11/16/2021  IR TRANSCATH/EMBOLIZ  12/03/2021  IR US GUIDE VASC ACCESS RIGHT  12/03/2021  PROSTATECTOMY    RADIOLOGY WITH ANESTHESIA N/A 12/03/2021  Procedure: Type II Endoleak;  Surgeon: Gilmer Mor, DO;  Location: [facility] OR;  Service: Anesthesiology;  Laterality: N/A;  RIGHT AND  LEFT HEART CATH N/A 09/18/2020  Procedure: RIGHT AND LEFT HEART CATH;  Surgeon: Lennette Bihari, MD;  Location: [facility] INVASIVE CV LAB;  Service: Cardiovascular;  Laterality: N/A;  subtotal gastrectomy   Madison P. Isenhour, M.S., CCC-SLP Speech-Language Pathologist Acute Rehabilitation Services Pager: [phone] Susanne Borders Isenhour 06/26/2022, 11:43 AM   DG Chest Port 1 View  Result Date: 06/26/2022 CLINICAL DATA:  Shortness of breath. EXAM: PORTABLE CHEST 1 VIEW COMPARISON:  06/25/2022 FINDINGS: The cardio pericardial silhouette is enlarged. Asymmetric elevation left hemidiaphragm. There is bibasilar atelectasis or infiltrate, similar to prior. No acute bony abnormality. Telemetry leads overlie the chest. IMPRESSION: Bibasilar atelectasis or infiltrate, progressive on the left. Electronically Signed   By: Kennith Center M.D.   On: 06/26/2022 08:59   DG Chest Port 1 View  Result Date: 06/25/2022 CLINICAL DATA:  Cough and shortness of breath EXAM: PORTABLE CHEST 1 VIEW COMPARISON:  06/24/2022 FINDINGS: Cardiac shadow is stable. Aortic calcifications are noted. Lungs are well aerated bilaterally. Increasing right basilar airspace opacity is noted consistent with developing infiltrate. No other focal abnormality is noted. IMPRESSION: Developing basilar infiltrate on the right. Electronically Signed   By: Alcide Clever M.D.   On: 06/25/2022 19:26   CT Head Wo Contrast  Result Date: 06/24/2022 CLINICAL DATA:  Cough, weakness, dizziness EXAM: CT HEAD WITHOUT CONTRAST TECHNIQUE: Contiguous axial images were obtained from the base of the skull through the vertex without intravenous contrast. RADIATION DOSE REDUCTION: This exam was performed according to the departmental dose-optimization program which includes automated exposure control, adjustment of  the mA and/or kV according to patient size and/or use of iterative reconstruction technique. COMPARISON:  07/13/2021 FINDINGS: Brain: No evidence of acute infarction,  hemorrhage, mass, mass effect, or midline shift. No hydrocephalus or extra-axial fluid collection. Periventricular white matter changes, likely the sequela of chronic small vessel ischemic disease. Vascular: No hyperdense vessel. Atherosclerotic calcifications in the intracranial carotid and vertebral arteries. Skull: Negative for fracture or focal lesion. Sinuses/Orbits: Mild mucosal thickening in the ethmoid air cells. Status post bilateral lens replacements. Other: None. IMPRESSION: No acute intracranial process. Electronically Signed   By: Wiliam Ke M.D.   On: 06/24/2022 14:39   DG Chest Port 1 View  Result Date: 06/24/2022 CLINICAL DATA:  Shortness of breath EXAM: PORTABLE CHEST 1 VIEW COMPARISON:  X-ray 11/04/2021 FINDINGS: Calcified aorta. Normal cardiopericardial silhouette. Elevation of the left hemidiaphragm. Slight vascular congestion. No pneumothorax or effusion. Overlapping cardiac leads. Degenerative changes seen of the left shoulder IMPRESSION: Elevation of the left hemidiaphragm. Calcified aorta. Central vascular congestion and interstitial changes. Electronically Signed   By: Karen Kays M.D.   On: 06/24/2022 10:15     TODAY-DAY OF DISCHARGE:  Subjective:   Brita Romp today has no headache,no chest abdominal pain,no new weakness tingling or numbness, feels much better wants to go home today.   Objective:   Blood pressure (!) 141/63, pulse 97, temperature 98.7 F (37.1 C), temperature source Axillary, resp. rate 15, height 6' (1.829 m), weight 81.4 kg, SpO2 95 %.  Intake/Output Summary (Last 24 hours) at 06/29/2022 1331 Last data filed at 06/29/2022 1042 Gross per 24 hour  Intake --  Output 1000 ml  Net -1000 ml   Filed Weights   06/28/22 0500 06/29/22 0645 06/29/22 1042  Weight: 82.1 kg 82.4 kg 81.4 kg    Exam: Awake Alert, Oriented *3, No new F.N deficits, Normal affect [place].AT,PERRAL Supple Neck,No JVD, No cervical lymphadenopathy appriciated.  Symmetrical Chest  wall movement, Good air movement bilaterally, CTAB RRR,No Gallops,Rubs or new Murmurs, No Parasternal Heave +ve B.Sounds, Abd Soft, Non tender, No organomegaly appriciated, No rebound -guarding or rigidity. No Cyanosis, Clubbing or edema, No new Rash or bruise   PERTINENT RADIOLOGIC STUDIES: No results found.   PERTINENT LAB RESULTS: CBC: Recent Labs    06/27/22 0237 06/29/22 0656  WBC 7.2 6.3  HGB 9.6* 8.8*  HCT 28.5* 26.8*  PLT 99* 92*   CMET CMP     Component Value Date/Time   NA 135 06/29/2022 0732   K 3.8 06/29/2022 0732   CL 96 (L) 06/29/2022 0732   CO2 22 06/29/2022 0732   GLUCOSE 96 06/29/2022 0732   BUN 69 (H) 06/29/2022 0732   CREATININE 11.71 (H) 06/29/2022 0732   CALCIUM 8.0 (L) 06/29/2022 0732   PROT 6.6 06/25/2022 0309   ALBUMIN 2.8 (L) 06/29/2022 0732   AST 36 06/25/2022 0309   ALT 25 06/25/2022 0309   ALKPHOS 149 (H) 06/25/2022 0309   BILITOT 1.4 (H) 06/25/2022 0309   GFRNONAA 4 (L) 06/29/2022 0732   GFRAA 9 (L) 10/31/2019 1843    GFR Estimated Creatinine Clearance: 5.5 mL/min (A) (by C-G formula based on SCr of 11.71 mg/dL (H)). No results for input(s): "LIPASE", "AMYLASE" in the last 72 hours. No results for input(s): "CKTOTAL", "CKMB", "CKMBINDEX", "TROPONINI" in the last 72 hours. Invalid input(s): "POCBNP" No results for input(s): "DDIMER" in the last 72 hours. No results for input(s): "HGBA1C" in the last 72 hours. No results for input(s): "CHOL", "HDL", "LDLCALC", "TRIG", "CHOLHDL", "LDLDIRECT"  in the last 72 hours. No results for input(s): "TSH", "T4TOTAL", "T3FREE", "THYROIDAB" in the last 72 hours.  Invalid input(s): "FREET3" No results for input(s): "VITAMINB12", "FOLATE", "FERRITIN", "TIBC", "IRON", "RETICCTPCT" in the last 72 hours. Coags: No results for input(s): "INR" in the last 72 hours.  Invalid input(s): "PT" Microbiology: Recent Results (from the past 240 hour(s))  Resp panel by RT-PCR (RSV, Flu A&B, Covid) Anterior  Nasal Swab     Status: None   Collection Time: 06/24/22  8:52 AM   Specimen: Anterior Nasal Swab  Result Value Ref Range Status   SARS Coronavirus 2 by RT PCR NEGATIVE NEGATIVE Final    Comment: (NOTE) SARS-CoV-2 target nucleic acids are NOT DETECTED.  The SARS-CoV-2 RNA is generally detectable in upper respiratory specimens during the acute phase of infection. The lowest concentration of SARS-CoV-2 viral copies this assay can detect is 138 copies/mL. A negative result does not preclude SARS-Cov-2 infection and should not be used as the sole basis for treatment or other patient management decisions. A negative result may occur with  improper specimen collection/handling, submission of specimen other than nasopharyngeal swab, presence of viral mutation(s) within the areas targeted by this assay, and inadequate number of viral copies(<138 copies/mL). A negative result must be combined with clinical observations, patient history, and epidemiological information. The expected result is Negative.  Fact Sheet for Patients:  BloggerCourse.com  Fact Sheet for Healthcare Providers:  SeriousBroker.it  This test is no t yet approved or cleared by the [place] FDA and  has been authorized for detection and/or diagnosis of SARS-CoV-2 by FDA under an Emergency Use Authorization (EUA). This EUA will remain  in effect (meaning this test can be used) for the duration of the COVID-19 declaration under Section 564(b)(1) of the Act, 21 U.S.C.section 360bbb-3(b)(1), unless the authorization is terminated  or revoked sooner.       Influenza A by PCR NEGATIVE NEGATIVE Final   Influenza B by PCR NEGATIVE NEGATIVE Final    Comment: (NOTE) The Xpert Xpress SARS-CoV-2/FLU/RSV plus assay is intended as an aid in the diagnosis of influenza from Nasopharyngeal swab specimens and should not be used as a sole basis for treatment. Nasal washings  and aspirates are unacceptable for Xpert Xpress SARS-CoV-2/FLU/RSV testing.  Fact Sheet for Patients: BloggerCourse.com  Fact Sheet for Healthcare Providers: SeriousBroker.it  This test is not yet approved or cleared by the [place] FDA and has been authorized for detection and/or diagnosis of SARS-CoV-2 by FDA under an Emergency Use Authorization (EUA). This EUA will remain in effect (meaning this test can be used) for the duration of the COVID-19 declaration under Section 564(b)(1) of the Act, 21 U.S.C. section 360bbb-3(b)(1), unless the authorization is terminated or revoked.     Resp Syncytial Virus by PCR NEGATIVE NEGATIVE Final    Comment: (NOTE) Fact Sheet for Patients: BloggerCourse.com  Fact Sheet for Healthcare Providers: SeriousBroker.it  This test is not yet approved or cleared by the [place] FDA and has been authorized for detection and/or diagnosis of SARS-CoV-2 by FDA under an Emergency Use Authorization (EUA). This EUA will remain in effect (meaning this test can be used) for the duration of the COVID-19 declaration under Section 564(b)(1) of the Act, 21 U.S.C. section 360bbb-3(b)(1), unless the authorization is terminated or revoked.  Performed at [facility], [address], [place], [place] [address]   Respiratory (~20 pathogens) panel by PCR     Status: Abnormal   Collection Time: 06/24/22  9:08 AM   Specimen: Nasopharyngeal Swab; Respiratory  Result Value Ref Range Status   Adenovirus NOT DETECTED NOT DETECTED Final   Coronavirus 229E NOT DETECTED NOT DETECTED Final    Comment: (NOTE) The Coronavirus on the Respiratory Panel, DOES NOT test for the novel  Coronavirus (2019 nCoV)    Coronavirus HKU1 NOT DETECTED NOT DETECTED Final   Coronavirus NL63 NOT DETECTED NOT DETECTED Final   Coronavirus OC43 NOT DETECTED NOT  DETECTED Final   Metapneumovirus NOT DETECTED NOT DETECTED Final   Rhinovirus / Enterovirus NOT DETECTED NOT DETECTED Final   Influenza A NOT DETECTED NOT DETECTED Final   Influenza B NOT DETECTED NOT DETECTED Final   Parainfluenza Virus 1 NOT DETECTED NOT DETECTED Final   Parainfluenza Virus 2 NOT DETECTED NOT DETECTED Final   Parainfluenza Virus 3 DETECTED (A) NOT DETECTED Final   Parainfluenza Virus 4 NOT DETECTED NOT DETECTED Final   Respiratory Syncytial Virus NOT DETECTED NOT DETECTED Final   Bordetella pertussis NOT DETECTED NOT DETECTED Final   Bordetella Parapertussis NOT DETECTED NOT DETECTED Final   Chlamydophila pneumoniae NOT DETECTED NOT DETECTED Final   Mycoplasma pneumoniae NOT DETECTED NOT DETECTED Final    Comment: Performed at [facility], [address], [place], [place] [address]    FURTHER DISCHARGE INSTRUCTIONS:  Get Medicines reviewed and adjusted: Please take all your medications with you for your next visit with your Primary MD  Laboratory/radiological data: Please request your Primary MD to go over all hospital tests and procedure/radiological results at the follow up, please ask your Primary MD to get all Hospital records sent to his/her office.  In some cases, they will be blood work, cultures and biopsy results pending at the time of your discharge. Please request that your primary care M.D. goes through all the records of your hospital data and follows up on these results.  Also Note the following: If you experience worsening of your admission symptoms, develop shortness of breath, life threatening emergency, suicidal or homicidal thoughts you must seek medical attention immediately by calling 911 or calling your MD immediately  if symptoms less severe.  You must read complete instructions/literature along with all the possible adverse reactions/side effects for all the Medicines you take and that have been prescribed to you. Take any new Medicines  after you have completely understood and accpet all the possible adverse reactions/side effects.   Do not drive when taking Pain medications or sleeping medications (Benzodaizepines)  Do not take more than prescribed Pain, Sleep and Anxiety Medications. It is not advisable to combine anxiety,sleep and pain medications without talking with your primary care practitioner  Special Instructions: If you have smoked or chewed Tobacco  in the last 2 yrs please stop smoking, stop any regular Alcohol  and or any Recreational drug use.  Wear Seat belts while driving.  Please note: You were cared for by a hospitalist during your hospital stay. Once you are discharged, your primary care physician will handle any further medical issues. Please note that NO REFILLS for any discharge medications will be authorized once you are discharged, as it is imperative that you return to your primary care physician (or establish a relationship with a primary care physician if you do not have one) for your post hospital discharge needs so that they can reassess your need for medications and monitor your lab values.  Total Time spent coordinating discharge including counseling, education and face to face time equals greater than 30 minutes.  Signed: Jeoffrey Massed 06/29/2022 1:31 PM

## 2022-06-29 NOTE — Progress Notes (Signed)
   06/29/22 1042  Vitals  Temp (!) 97.4 F (36.3 C)  Pulse Rate 84  Resp (!) 27  BP (!) 124/59  SpO2 95 %  O2 Device Room Air  Weight 81.4 kg  Post Treatment  Dialyzer Clearance Clear  Duration of HD Treatment -hour(s) 3.5 hour(s)  Hemodialysis Intake (mL) 0 mL  Liters Processed 84  Fluid Removed (mL) 1000 mL  Tolerated HD Treatment Yes  AVG/AVF Arterial Site Held (minutes) 8 minutes  AVG/AVF Venous Site Held (minutes) 8 minutes   Received patient in bed to unit.  Alert and oriented.  Informed consent signed and in chart.   TX duration:3.5hrs  Patient tolerated well.  Transported back to the room  Alert, without acute distress.  Hand-off given to patient's nurse.   Access used: LAVF Access issues: none  Total UF removed: 1L Medication(s) given: none    [facility]

## 2022-06-29 NOTE — Progress Notes (Signed)
[facility] Progress Note   Subjective:   Seen on dialysis. Feeling ok this AM, cough improved, denies SOB. BP good so far this morning. He denies SOB, CP, dizziness, nausea.   Objective Vitals:   06/29/22 0645 06/29/22 0700 06/29/22 0730 06/29/22 0800  BP: 120/66 (!) 125/59 123/65 124/61  Pulse: 73 67 81 67  Resp: (!) 23 (!) 22 20 20   Temp:      TempSrc:      SpO2: 94% 93% 96% 96%  Weight: 82.4 kg     Height:       Physical Exam General: Alert male in NAD Heart: RRR, no murmurs, rubs or gallops Lungs: CTA bilaterally, respirations unlabored on RA Abdomen: Soft, non-distended, +BS Extremities: No edema b/l lower extremities Dialysis Access: LUE AVF + t/b  Additional Objective Labs: Basic Metabolic Panel: Recent Labs  Lab 06/26/22 0214 06/27/22 0237 06/29/22 0732  NA 134* 135 135  K 4.1 3.8 3.8  CL 95* 93* 96*  CO2 27 25 22   GLUCOSE 101* 113* 96  BUN 27* 47* 69*  CREATININE 6.04* 8.42* 11.71*  CALCIUM 8.3* 8.4* 8.0*  PHOS 3.0 3.2 4.5   Liver Function Tests: Recent Labs  Lab 06/24/22 0852 06/25/22 0309 06/26/22 0214 06/27/22 0237 06/29/22 0732  AST 27 36  --   --   --   ALT 18 25  --   --   --   ALKPHOS 106 149*  --   --   --   BILITOT 0.7 1.4*  --   --   --   PROT 4.6* 6.6  --   --   --   ALBUMIN 2.3* 3.0* 3.4* 3.4* 2.8*   No results for input(s): "LIPASE", "AMYLASE" in the last 168 hours. CBC: Recent Labs  Lab 06/24/22 2141 06/25/22 0309 06/26/22 0214 06/27/22 0237 06/29/22 0656  WBC 7.3 7.4 9.2 7.2 6.3  HGB 10.5* 10.5* 9.4* 9.6* 8.8*  HCT 32.4* 32.6* 27.7* 28.5* 26.8*  MCV 105.5* 106.2* 103.0* 102.2* 102.7*  PLT 107* 108* 109* 99* 92*   Blood Culture    Component Value Date/Time   SDES BLOOD RIGHT ARM 10/23/2021 2256   SDES BLOOD LEFT HAND 10/23/2021 2256   SPECREQUEST  10/23/2021 2256    BOTTLES DRAWN AEROBIC AND ANAEROBIC Blood Culture adequate volume   SPECREQUEST  10/23/2021 2256    BOTTLES DRAWN AEROBIC ONLY Blood  Culture adequate volume   CULT  10/23/2021 2256    NO GROWTH 5 DAYS Performed at [facility], [address], [place], [place] [address]    CULT  10/23/2021 2256    NO GROWTH 5 DAYS Performed at [facility], [address] [place], [place] [address]    REPTSTATUS 10/28/2021 FINAL 10/23/2021 2256   REPTSTATUS 10/28/2021 FINAL 10/23/2021 2256    Medications:  ampicillin-sulbactam (UNASYN) IV 1.5 g (06/28/22 2141)    apixaban  2.5 mg Oral BID   atorvastatin  40 mg Oral Daily   Chlorhexidine Gluconate Cloth  6 each Topical Q0600   Chlorhexidine Gluconate Cloth  6 each Topical Q0600   donepezil  5 mg Oral QHS   levothyroxine  50 mcg Oral Daily   multivitamin  1 tablet Oral Daily    Outpatient Dialysis Orders: TTS - [facility]  3.75hrs, BFR 350, DFR 500,  EDW 83.5kg, 3K/ 2.5Ca Access: LU AVF  Heparin none Mircera 50 mcg q2wks - last 4/25 Venofer 100mg  qHD x10 Calcitriol 0.53mcg PO qHD  Assessment/Plan:  Acute metabolic encephalopathy - w/ cognitive dysfunction at baseline. likely 2/2 Parainfluenza Viral infection. Improved Parainfluenza - per PMD. Cough improving Possible aspiration - swallow study completed - moderate aspiration risk.  Additional swallow study to eval esophagus to follow.   Fatigue/weakness/deconditioning - likely 2/2 #2.  Hypocalcemia - Ca 5.7 on admit, improved to 8.3 w/calcium gluconate - this is close to baseline. Corrected calcium at goal.   ESRD -  On HD TTS.  Treatment was rolled over to today, next HD will be tomorrow.   Hypertension/volume  - BP well controlled. On toprol-Xl 37.5mg  qd and Imdur 15mg  qd outpatient. Metoprolol resumed here at lower dose, imdur on hold. Family reports he has had hypotension with dialysis for the past year. Outpatient records reviewed, hypotension appears to be intermittent but improved after EDW was raised. If recurs, consider adding midodrine pre-HD  Anemia of CKD - Hgb 8.8. ESA recently dosed. No bleeding  reported.   Secondary Hyperparathyroidism -  Ca as noted above. Phos in goal. Continue VDRA. Not on binders.   Nutrition - Renal diet w/fluid restrictions.  Rogers Blocker, PA-C 06/29/2022, 8:22 AM   [facility] Pager: [phone]

## 2022-06-29 NOTE — Plan of Care (Signed)
[facility] Patient Discharge Orders- [facility] CLINIC: [facility]  Patient's name: Ricky Harris Admit/DC Dates: 06/24/2022 - 06/29/22  Discharge Diagnoses: Acute Metabolic Encephalopathy   Parainfluenza infection Presumed aspiration pneumonia Hypotension  Aranesp: Given: No   Date and amount of last dose: N/A  Last Hgb: 8.8 PRBC's Given: No Date/# of units: N/A ESA dose for discharge: mircera 75 units IV q 2 weeks  IV Iron dose at discharge: Resume bolus, no venofer given here  Heparin change: No  EDW Change: Yes New EDW: 82kg  Bath Change: No  Access intervention/Change: No Details:  Hectorol/Calcitriol change: No  Discharge Labs: Calcium 8.0 Phosphorus 4.5 Albumin 2.8 K+ 3.8  IV Antibiotics: No (PO augmentin) Details:  On Coumadin?: No (on eliquis) Last INR: Next INR: Managed By:   OTHER/APPTS/LAB ORDERS: Holding antihypertensives at discharge. If >2kg over EDW, please call for UF orders/new EDW    D/C Meds to be reconciled by nurse after every discharge.  Completed By: Rogers Blocker, PA-C 06/29/2022, 1:37 PM  [facility] Pager: [phone]  Reviewed by: MD:______ RN_______

## 2022-06-29 NOTE — Progress Notes (Signed)
Physical Therapy Treatment Patient Details Name: Ricky Harris MRN: [id number] DOB: 08-14-1940 Today's Date: 06/29/2022   History of Present Illness Pt is an 82 y.o. male who presented 06/24/22 with cough/weakness and confusion. CT head with no acute intracranial abnormality. Positive for parainfluenza virus 3. Admitted also with acute metabolic encephalopathy likely due to parainfluenza virus infection. PMH: ESRD on HD TTS, afib, AAA, diastolic CHF, HTN, history of liver cirrhosis nonalcoholic, HLD, hypothyroidism, anemia, arthritis, cancer, gout, MI, prediabetes, short-term memory loss    PT Comments    Pt tolerated today's session well, feeling slightly dizzy while standing ~3 minutes but ambulating well after a seated rest break. BP maintaining in low 100s throughout, HR elevating to 120s but recovers with seated rest break. Pt's son present throughout session, reports feeling comfortable with pt's functional status, reports him or his wife are home a majority of the time. Pt reports his plan is to return home, only requiring close guard for safety throughout and use of RW, no overt LOB. Acute PT will continue to follow up with pt as appropriate during admission, recommend HHPT upon discharge with continued family support.    Recommendations for follow up therapy are one component of a multi-disciplinary discharge planning process, led by the attending physician.  Recommendations may be updated based on patient status, additional functional criteria and insurance authorization.  Follow Up Recommendations       Assistance Recommended at Discharge Frequent or constant Supervision/Assistance  Patient can return home with the following A little help with walking and/or transfers;A little help with bathing/dressing/bathroom;Assistance with cooking/housework;Direct supervision/assist for medications management;Direct supervision/assist for financial management;Assist for transportation;Help with  stairs or ramp for entrance   Equipment Recommendations  Rollator (4 wheels)    Recommendations for Other Services       Precautions / Restrictions Precautions Precautions: Fall;Other (comment) Precaution Comments: watch BP Restrictions Weight Bearing Restrictions: No     Mobility  Bed Mobility Overal bed mobility: Needs Assistance Bed Mobility: Supine to Sit, Sit to Supine     Supine to sit: Supervision Sit to supine: Supervision   General bed mobility comments: slightly increased time and use of bed rails but pt completing without physical assist    Transfers Overall transfer level: Needs assistance Equipment used: Rolling walker (2 wheels) Transfers: Sit to/from Stand Sit to Stand: Min guard           General transfer comment: proper hand technique performed x2 trials, close guard for safety and balance    Ambulation/Gait Ambulation/Gait assistance: Min guard Gait Distance (Feet): 60 Feet Assistive device: Rolling walker (2 wheels) Gait Pattern/deviations: Step-through pattern, Decreased step length - right, Decreased step length - left, Decreased stride length, Trunk flexed Gait velocity: decreased     General Gait Details: cueing throughout for upright posture and forward gaze, pt able to correct but not maintaining. Pt able to identify when he needs to turn around to head back to the room, no overt LOB today with close guard for balance and safety   Stairs             Wheelchair Mobility    Modified Rankin (Stroke Patients Only)       Balance Overall balance assessment: Needs assistance Sitting-balance support: No upper extremity supported, Feet supported Sitting balance-Leahy Scale: Good     Standing balance support: Bilateral upper extremity supported, During functional activity, Reliant on assistive device for balance Standing balance-Leahy Scale: Poor Standing balance comment: reliant on RW  Cognition Arousal/Alertness: Awake/alert Behavior During Therapy: WFL for tasks assessed/performed Overall Cognitive Status: Impaired/Different from baseline Area of Impairment: Problem solving, Safety/judgement, Awareness                         Safety/Judgement: Decreased awareness of deficits, Decreased awareness of safety Awareness: Emergent Problem Solving: Slow processing, Requires verbal cues General Comments: Some memory deficits at baseline; pt HOH but pleasant. Son at bedside, not reporting any concerns        Exercises Other Exercises Other Exercises: seated marches x10 B prior to ambulation trial    General Comments General comments (skin integrity, edema, etc.): BP 111/57 in supine HR 97, 118/62 and 97 in sitting, 111/52 and 115 when standing initially, 108/62 and 124 after standing 3 minutes. Upon return from ambulation BP 107/68 and HR 109      Pertinent Vitals/Pain Pain Assessment Pain Assessment: Faces Faces Pain Scale: No hurt    Home Living                          Prior Function            PT Goals (current goals can now be found in the care plan section) Acute Rehab PT Goals Patient Stated Goal: to feel better PT Goal Formulation: With patient/family Time For Goal Achievement: 07/10/22 Potential to Achieve Goals: Good Progress towards PT goals: Progressing toward goals    Frequency    Min 3X/week      PT Plan Current plan remains appropriate    Co-evaluation              AM-PAC PT "6 Clicks" Mobility   Outcome Measure  Help needed turning from your back to your side while in a flat bed without using bedrails?: A Little Help needed moving from lying on your back to sitting on the side of a flat bed without using bedrails?: A Little Help needed moving to and from a bed to a chair (including a wheelchair)?: A Little Help needed standing up from a chair using your arms (e.g., wheelchair or bedside chair)?: A  Little Help needed to walk in hospital room?: A Little Help needed climbing 3-5 steps with a railing? : A Lot 6 Click Score: 17    End of Session Equipment Utilized During Treatment: Gait belt Activity Tolerance: Patient tolerated treatment well Patient left: in bed;with call bell/phone within reach;with family/visitor present;with bed alarm set Nurse Communication: Mobility status PT Visit Diagnosis: Unsteadiness on feet (R26.81);Other abnormalities of gait and mobility (R26.89);Muscle weakness (generalized) (M62.81);Difficulty in walking, not elsewhere classified (R26.2);Dizziness and giddiness (R42)     Time: 1610-9604 PT Time Calculation (min) (ACUTE ONLY): 23 min  Charges:  $Gait Training: 8-22 mins $Therapeutic Activity: 8-22 mins                     Lindalou Hose, PT DPT Acute Rehabilitation Services Office [phone]    Leonie Man 06/29/2022, 2:07 PM

## 2022-06-29 NOTE — Plan of Care (Signed)

## 2022-06-30 ENCOUNTER — Telehealth: Payer: Self-pay | Admitting: Physician Assistant

## 2022-06-30 NOTE — Telephone Encounter (Signed)
Transition of care contact from inpatient facility  Date of Discharge: 06/29/22 Date of Contact: 06/30/22 Method of contact: Phone  Attempted to contact patient to discuss transition of care from inpatient admission. Patient did not answer the phone. Pt will follow up at outpatient HD unit tomorrow  Rogers Blocker, PA-C 06/30/2022, 11:02 AM   [facility] Pager: [phone]

## 2022-07-06 ENCOUNTER — Emergency Department (HOSPITAL_COMMUNITY)
Admission: EM | Admit: 2022-07-06 | Discharge: 2022-07-07 | Disposition: A | Discharge status: Home / Self Care | Payer: Medicare Other | Attending: Emergency Medicine | Admitting: Emergency Medicine

## 2022-07-06 ENCOUNTER — Emergency Department (HOSPITAL_COMMUNITY): Payer: Medicare Other

## 2022-07-06 ENCOUNTER — Other Ambulatory Visit: Payer: Self-pay

## 2022-07-06 DIAGNOSIS — I2510 Atherosclerotic heart disease of native coronary artery without angina pectoris: Secondary | ICD-10-CM | POA: Diagnosis not present

## 2022-07-06 DIAGNOSIS — I959 Hypotension, unspecified: Secondary | ICD-10-CM | POA: Diagnosis not present

## 2022-07-06 DIAGNOSIS — R531 Weakness: Secondary | ICD-10-CM | POA: Insufficient documentation

## 2022-07-06 DIAGNOSIS — Z7901 Long term (current) use of anticoagulants: Secondary | ICD-10-CM | POA: Diagnosis not present

## 2022-07-06 DIAGNOSIS — Z1152 Encounter for screening for COVID-19: Secondary | ICD-10-CM | POA: Insufficient documentation

## 2022-07-06 DIAGNOSIS — I509 Heart failure, unspecified: Secondary | ICD-10-CM | POA: Insufficient documentation

## 2022-07-06 DIAGNOSIS — Z992 Dependence on renal dialysis: Secondary | ICD-10-CM | POA: Diagnosis not present

## 2022-07-06 DIAGNOSIS — N186 End stage renal disease: Secondary | ICD-10-CM | POA: Insufficient documentation

## 2022-07-06 LAB — COMPREHENSIVE METABOLIC PANEL
ALT: 29 U/L (ref 0–44)
AST: 37 U/L (ref 15–41)
Albumin: 3.2 g/dL — ABNORMAL LOW (ref 3.5–5.0)
Alkaline Phosphatase: 173 U/L — ABNORMAL HIGH (ref 38–126)
Anion gap: 11 (ref 5–15)
BUN: 26 mg/dL — ABNORMAL HIGH (ref 8–23)
CO2: 27 mmol/L (ref 22–32)
Calcium: 8.7 mg/dL — ABNORMAL LOW (ref 8.9–10.3)
Chloride: 97 mmol/L — ABNORMAL LOW (ref 98–111)
Creatinine, Ser: 6.59 mg/dL — ABNORMAL HIGH (ref 0.61–1.24)
GFR, Estimated: 8 mL/min — ABNORMAL LOW (ref >60)
Glucose, Bld: 106 mg/dL — ABNORMAL HIGH (ref 70–99)
Potassium: 5 mmol/L (ref 3.5–5.1)
Sodium: 135 mmol/L (ref 135–145)
Total Bilirubin: 1.1 mg/dL (ref 0.3–1.2)
Total Protein: 7.3 g/dL (ref 6.5–8.1)

## 2022-07-06 LAB — CBC WITH DIFFERENTIAL/PLATELET
Abs Immature Granulocytes: 0.04 10*3/uL (ref 0.00–0.07)
Basophils Absolute: 0.1 10*3/uL (ref 0.0–0.1)
Basophils Relative: 1 %
Eosinophils Absolute: 0.1 10*3/uL (ref 0.0–0.5)
Eosinophils Relative: 1 %
HCT: 29.9 % — ABNORMAL LOW (ref 39.0–52.0)
Hemoglobin: 9.9 g/dL — ABNORMAL LOW (ref 13.0–17.0)
Immature Granulocytes: 1 %
Lymphocytes Relative: 11 %
Lymphs Abs: 0.9 10*3/uL (ref 0.7–4.0)
MCH: 34.7 pg — ABNORMAL HIGH (ref 26.0–34.0)
MCHC: 33.1 g/dL (ref 30.0–36.0)
MCV: 104.9 fL — ABNORMAL HIGH (ref 80.0–100.0)
Monocytes Absolute: 0.7 10*3/uL (ref 0.1–1.0)
Monocytes Relative: 8 %
Neutro Abs: 6.6 10*3/uL (ref 1.7–7.7)
Neutrophils Relative %: 78 %
Platelets: 131 10*3/uL — ABNORMAL LOW (ref 150–400)
RBC: 2.85 MIL/uL — ABNORMAL LOW (ref 4.22–5.81)
RDW: 15.9 % — ABNORMAL HIGH (ref 11.5–15.5)
WBC: 8.4 10*3/uL (ref 4.0–10.5)
nRBC: 0 % (ref 0.0–0.2)

## 2022-07-06 LAB — RESP PANEL BY RT-PCR (RSV, FLU A&B, COVID)  RVPGX2
Influenza A by PCR: NEGATIVE
Influenza B by PCR: NEGATIVE
Resp Syncytial Virus by PCR: NEGATIVE
SARS Coronavirus 2 by RT PCR: NEGATIVE

## 2022-07-06 LAB — LACTIC ACID, PLASMA
Lactic Acid, Venous: 1.7 mmol/L (ref 0.5–1.9)
Lactic Acid, Venous: 1.4 mmol/L (ref 0.5–1.9)

## 2022-07-06 LAB — TROPONIN I (HIGH SENSITIVITY)
Troponin I (High Sensitivity): 27 ng/L — ABNORMAL HIGH (ref <18)
Troponin I (High Sensitivity): 26 ng/L — ABNORMAL HIGH (ref <18)

## 2022-07-06 LAB — BRAIN NATRIURETIC PEPTIDE: B Natriuretic Peptide: 234 pg/mL — ABNORMAL HIGH (ref 0.0–100.0)

## 2022-07-06 LAB — MAGNESIUM: Magnesium: 2.1 mg/dL (ref 1.7–2.4)

## 2022-07-06 MED ORDER — IOHEXOL 350 MG/ML SOLN
100.0000 mL | Freq: Once | INTRAVENOUS | Status: AC | PRN
  Administered 2022-07-06: 100 mL via INTRAVENOUS

## 2022-07-06 MED ORDER — LACTATED RINGERS IV BOLUS
250.0000 mL | Freq: Once | INTRAVENOUS | Status: AC
  Administered 2022-07-06: 250 mL via INTRAVENOUS

## 2022-07-06 NOTE — ED Provider Notes (Addendum)
[facility] EMERGENCY DEPARTMENT AT [facility] Provider Note   CSN: [id number] Arrival date & time: 07/06/22  1810     History  Chief Complaint  Patient presents with   Hypotension    Ricky Harris is a 82 y.o. male.  HPI Patient presents for hypotension.  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  He was admitted to the hospital a week ago for cough, weakness, and encephalopathy.  He was found to have parainfluenza.  He had a presumed aspiration event while in the hospital and was treated for aspiration pneumonia.  While in the hospital, he did have hypotension and home blood pressure medications were discontinued.  Blood pressure was treated with IVF, albumin, and 1 dose of midodrine.  Since his discharge 1 week ago, he has been off of all blood pressure medications.  He states that they have not had concerns of hypotension during his dialysis sessions.  He has been going as scheduled.  Last dialysis session was yesterday.  Patient's blood pressure measured at home this morning was normal.  He went to his PCPs office this afternoon for a 1 week hospitalization follow-up appointment.  While at PCPs office, blood pressure was in the 90s over 60s.  This was checked several times.  EMS was called to the PCP office.  EMS noted normotension during transit.  Patient reports that he has had fatigue and generalized weakness over the past 5 days.  He has been ambulating with a walker.  He has been eating and drinking normally.  He denies any vomiting or diarrhea.  Son noted shortness of breath last night which has resolved.    Home Medications Prior to Admission medications   Medication Sig Start Date End Date Taking? Authorizing Provider  apixaban (ELIQUIS) 2.5 MG TABS tablet Take 1 tablet (2.5 mg total) by mouth 2 (two) times daily. 05/23/22   [place], Peter M, MD  atorvastatin (LIPITOR) 40 MG tablet Take 1  tablet (40 mg total) by mouth daily. Patient taking differently: Take 40 mg by mouth at bedtime. 04/28/21   Noralee Stain, DO  cetirizine (ZYRTEC) 10 MG tablet Take 10 mg by mouth daily as needed for allergies.    [provider]  donepezil (ARICEPT) 5 MG tablet Take 5 mg by mouth at bedtime. 03/01/22   [provider]  levothyroxine (SYNTHROID) 50 MCG tablet Take 1 tablet (50 mcg total) by mouth daily. 04/27/21   Noralee Stain, DO  multivitamin (RENA-VIT) TABS tablet Take 1 tablet by mouth daily. Patient taking differently: Take 1 tablet by mouth at bedtime. 09/27/19   Autry-Lott, Randa Evens, DO  nitroGLYCERIN (NITROSTAT) 0.4 MG SL tablet Place 0.4 mg under the tongue every 5 (five) minutes as needed for chest pain.    [provider]  ondansetron (ZOFRAN) 4 MG tablet Take 4 mg by mouth every 8 (eight) hours as needed for nausea or vomiting. 05/10/22   [provider]  traMADol (ULTRAM) 50 MG tablet Take 1 tablet (50 mg total) by mouth every 8 (eight) hours as needed for moderate pain. 03/12/19   Rhyne, Ames Coupe, PA-C      Allergies    Cephalexin and Hydralazine    Review of Systems   Review of Systems  Constitutional:  Positive for fatigue.  Neurological:  Positive for weakness (Generalized).  All other systems reviewed and are negative.   Physical Exam Updated Vital Signs BP 119/62  Pulse 71   Temp 98.4 F (36.9 C) (Oral)   Resp 17   Ht 6' (1.829 m)   Wt 90.7 kg   SpO2 96%   BMI 27.12 kg/m  Physical Exam Vitals and nursing note reviewed.  Constitutional:      General: He is not in acute distress.    Appearance: Normal appearance. He is well-developed. He is not ill-appearing, toxic-appearing or diaphoretic.  HENT:     Head: Normocephalic and atraumatic.     Right Ear: External ear normal.     Left Ear: External ear normal.     Nose: Nose normal.     Mouth/Throat:     Mouth: Mucous membranes are moist.  Eyes:     Extraocular Movements:  Extraocular movements intact.     Conjunctiva/sclera: Conjunctivae normal.  Cardiovascular:     Rate and Rhythm: Normal rate and regular rhythm.     Heart sounds: No murmur heard. Pulmonary:     Effort: Pulmonary effort is normal. No respiratory distress.     Breath sounds: Normal breath sounds. No wheezing or rales.  Abdominal:     General: There is no distension.     Palpations: Abdomen is soft.     Tenderness: There is no abdominal tenderness.  Musculoskeletal:        General: No swelling. Normal range of motion.     Cervical back: Normal range of motion and neck supple.     Right lower leg: No edema.     Left lower leg: No edema.  Skin:    General: Skin is warm and dry.     Coloration: Skin is not jaundiced or pale.  Neurological:     General: No focal deficit present.     Mental Status: He is alert and oriented to person, place, and time.     Cranial Nerves: No cranial nerve deficit.     Sensory: No sensory deficit.     Motor: No weakness.     Coordination: Coordination normal.  Psychiatric:        Mood and Affect: Mood normal.        Behavior: Behavior normal.        Thought Content: Thought content normal.        Judgment: Judgment normal.     ED Results / Procedures / Treatments   Labs (all labs ordered are listed, but only abnormal results are displayed) Labs Reviewed  COMPREHENSIVE METABOLIC PANEL - Abnormal; Notable for the following components:      Result Value   Chloride 97 (*)    Glucose, Bld 106 (*)    BUN 26 (*)    Creatinine, Ser 6.59 (*)    Calcium 8.7 (*)    Albumin 3.2 (*)    Alkaline Phosphatase 173 (*)    GFR, Estimated 8 (*)    All other components within normal limits  CBC WITH DIFFERENTIAL/PLATELET - Abnormal; Notable for the following components:   RBC 2.85 (*)    Hemoglobin 9.9 (*)    HCT 29.9 (*)    MCV 104.9 (*)    MCH 34.7 (*)    RDW 15.9 (*)    Platelets 131 (*)    All other components within normal limits  BRAIN NATRIURETIC  PEPTIDE - Abnormal; Notable for the following components:   B Natriuretic Peptide 234.0 (*)    All other components within normal limits  TROPONIN I (HIGH SENSITIVITY) - Abnormal; Notable for the following components:   Troponin  I (High Sensitivity) 27 (*)    All other components within normal limits  TROPONIN I (HIGH SENSITIVITY) - Abnormal; Notable for the following components:   Troponin I (High Sensitivity) 26 (*)    All other components within normal limits  RESP PANEL BY RT-PCR (RSV, FLU A&B, COVID)  RVPGX2  CULTURE, BLOOD (ROUTINE X 2)  CULTURE, BLOOD (ROUTINE X 2)  LACTIC ACID, PLASMA  LACTIC ACID, PLASMA  MAGNESIUM  URINALYSIS, ROUTINE W REFLEX MICROSCOPIC    EKG EKG Interpretation  Date/Time:  Wednesday Jul 06 2022 18:53:01 EDT Ventricular Rate:  80 PR Interval:  170 QRS Duration: 120 QT Interval:  380 QTC Calculation: 439 R Axis:   -46 Text Interpretation: Sinus rhythm Nonspecific IVCD with LAD Left ventricular hypertrophy Anterior infarct, old Confirmed by Gloris Manchester [phone]) on 07/06/2022 8:17:27 PM  Radiology CT Angio Chest/Abd/Pel for Dissection W and/or Wo Contrast  Result Date: 07/07/2022 CLINICAL DATA:  Acute aortic syndrome suspected EXAM: CT ANGIOGRAPHY CHEST, ABDOMEN AND PELVIS TECHNIQUE: Non-contrast CT of the chest was initially obtained. Multidetector CT imaging through the chest, abdomen and pelvis was performed using the standard protocol during bolus administration of intravenous contrast. Multiplanar reconstructed images and MIPs were obtained and reviewed to evaluate the vascular anatomy. RADIATION DOSE REDUCTION: This exam was performed according to the departmental dose-optimization program which includes automated exposure control, adjustment of the mA and/or kV according to patient size and/or use of iterative reconstruction technique. CONTRAST:  OMNIPAQUE IOHEXOL 350 MG/ML SOLN COMPARISON:  Chest radiograph 07/06/2022 and CT abdomen and pelvis  11/04/2021; CT chest abdomen and pelvis 09/05/2020 FINDINGS: CTA CHEST FINDINGS Cardiovascular: Normal heart size. No pericardial effusion. Coronary artery and aortic atherosclerotic calcification. Dilated ascending aorta measuring 42 mm in diameter. No intramural hematoma, penetrating atherosclerotic ulcer, or dissection. Aberrant right subclavian artery. Mediastinum/Nodes: Unremarkable esophagus.  No thoracic adenopathy. Lungs/Pleura: Bibasilar atelectasis/scarring. Trace right pleural effusion. No pneumothorax. Mild bronchial wall thickening. Elevated left hemidiaphragm. Musculoskeletal: No acute fracture. Review of the MIP images confirms the above findings. CTA ABDOMEN AND PELVIS FINDINGS VASCULAR Aorta: Aortic atherosclerotic calcification. 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 The excluded aneurysm sac is similar to slightly increased in size measuring 72 x 66 mm in the axial plane, previously 71 x 65 mm using similar measuring technique. 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 The endograft is patent. No dissection. Celiac: Patent without evidence of aneurysm, dissection, vasculitis or significant stenosis. SMA: Patent without evidence of aneurysm, dissection, vasculitis or significant stenosis. Renals: Calcified plaque at the origins of both renal arteries causes severe narrowing. IMA: Occluded at the origin with distal reconstitution through collaterals Inflow: Scattered calcified atherosclerotic plaque. No aneurysm, dissection. 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 Veins: No obvious venous abnormality within the limitations of this arterial phase study. Review of the MIP images confirms the above findings. NON-VASCULAR Hepatobiliary: Cholecystectomy. Unremarkable liver. No biliary dilation. Pancreas: Unremarkable. Spleen: Unremarkable. Adrenals/Urinary Tract: Stable adrenal glands.  Bilateral cortical renal scarring. Low-density cyst in the right kidney. No follow-up recommended. Nonobstructing punctate calculus in the lower pole of the right kidney. No hydronephrosis. Bladder is not distended. Stomach/Bowel: Normal caliber large and small bowel. Extensive colonic diverticulosis. No diverticulitis. Normal appendix. Stomach is within normal limits. Lymphatic: No lymphadenopathy. Reproductive: Prostatectomy. Penile prosthesis reservoir seen in the right inguinal region and left pelvis. Unchanged soft tissue prominence posterior to the right inguinal canal dating back  to 02/18/2020 which favors postsurgical change/granulation tissue. Other: No free intraperitoneal fluid or air. Musculoskeletal: Degenerative arthritis both hips. Thoracolumbar spondylosis. No acute osseous abnormality. Review of the MIP images confirms the above findings. IMPRESSION: 1. No acute aortic syndrome. 2. 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 The excluded aneurysm sac is similar to slightly increased in size measuring 72 x 66 mm in the axial plane, previously 71 x 65 mm using similar measuring technique. 3. Extensive colonic diverticulosis without diverticulitis. Electronically Signed   By: Minerva Fester M.D.   On: 07/07/2022 00:00   DG Chest Port 1 View  Result Date: 07/06/2022 CLINICAL DATA:  Possible sepsis EXAM: PORTABLE CHEST 1 VIEW COMPARISON:  06/26/2022, CT 09/15/2020, 04/14/2020 FINDINGS: Chronic elevation of left diaphragm with mild atelectasis at the left base. Stable cardiomediastinal silhouette with aortic atherosclerosis. No pneumothorax. IMPRESSION: No active disease. Chronic elevation of left diaphragm with mild atelectasis at the left base. Electronically Signed   By: Jasmine Pang M.D.   On: 07/06/2022 19:28    Procedures Procedures    Medications Ordered in ED Medications  lactated ringers bolus 250 mL (250 mLs  Intravenous New Bag/Given 07/06/22 1913)  iohexol (OMNIPAQUE) 350 MG/ML injection 100 mL (100 mLs Intravenous Contrast Given 07/06/22 2326)    ED Course/ Medical Decision Making/ A&P                             Medical Decision Making Amount and/or Complexity of Data Reviewed Labs: ordered. Radiology: ordered. ECG/medicine tests: ordered.  Risk Prescription drug management.   This patient presents to the ED for concern of hypotension, this involves an extensive number of treatment options, and is a complaint that carries with it a high risk of complications and morbidity.  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  The pertinent results include: Elevated creatinine consistent with ESRD.  Normal electrolytes.  Albumin is improving baseline.  Anemia is baseline.  Lactate is normal.   Imaging Studies ordered:  I ordered imaging studies including chest x-ray, CTA dissection study I independently visualized and interpreted imaging which showed no acute findings on x-ray, CTA pending at time of signout. I agree with the radiologist interpretation   Cardiac Monitoring: / EKG:  The patient was maintained on a cardiac monitor.  I personally viewed and interpreted the cardiac monitored which showed an underlying rhythm of: Sinus rhythm  Problem List / ED Course / Critical interventions / Medication management  Patient presents for hypotension, identified at PCPs office earlier today.  He does state that he has felt fatigue and  generalized weakness over the past 5 days.  This follows a recent hospitalization where he was treated for weakness, encephalopathy, and aspiration pneumonia.  He was discharged from the hospital 7 days ago.  He has been adherent to his dialysis schedule.  He is currently off of all home blood pressure medications.  Blood pressure on arrival was 126/67.  Patient is well-appearing on exam.  Breathing is unlabored.  He is accompanied by  his son at bedside.  Small bolus of IV fluids was ordered.  Diagnostic workup was initiated.  Lab work is unremarkable.  Patient has maintained improved albumin following dose during hospitalization.  On monitor, patient remained normotensive while in the ED.  He did not have orthostatic hypotension with sitting or standing.  He did not have near syncopal symptoms with standing.  Given his known history of AAA with abdominal discomfort and episode of hypotension today, there is concern of possible rupture.  CTA dissection study was obtained which did not show any concerning acute findings.  Patient is also comfortable with discharge home.  They were advised to continue to monitor blood pressure medications and follow-up with PCP.  Patient was discharged in stable condition. I ordered medication including IV fluids for hydration Reevaluation of the patient after these medicines showed that the patient improved I have reviewed the patients home medicines and have made adjustments as needed   Social Determinants of Health:  Lives at home with son         Final Clinical Impression(s) / ED Diagnoses Final diagnoses:  Generalized weakness    Rx / DC Orders ED Discharge Orders     None         Gloris Manchester, MD 07/06/22 2251    Gloris Manchester, MD 07/07/22 0007

## 2022-07-06 NOTE — ED Notes (Signed)
[facility] from PCP due to hypotension. EMS states pcp got a BP of 90/60 and has remained WNL with them enroute to ED, with BP remaining around 120/62

## 2022-07-07 LAB — CULTURE, BLOOD (ROUTINE X 2)

## 2022-07-07 NOTE — Discharge Instructions (Signed)
Continue to monitor blood pressures at home.  Follow-up with PCP.  Return to the emergency department for any new or worsening symptoms of concern.

## 2022-07-08 LAB — CULTURE, BLOOD (ROUTINE X 2)

## 2022-07-10 LAB — CULTURE, BLOOD (ROUTINE X 2)

## 2022-07-11 LAB — CULTURE, BLOOD (ROUTINE X 2): Culture: NO GROWTH

## 2022-07-26 IMAGING — US IR PARACENTESIS
1 series · 3 of 3 positions shown · non-contrast
Comparison: none

INDICATION: Patient with cirrhosis with recurrent ascites. Request is for
therapeutic paracentesis

[Series 1: ir (id) (id)/(id)/(id) ir · 3 of 3 slices shown]
[im 1/3]
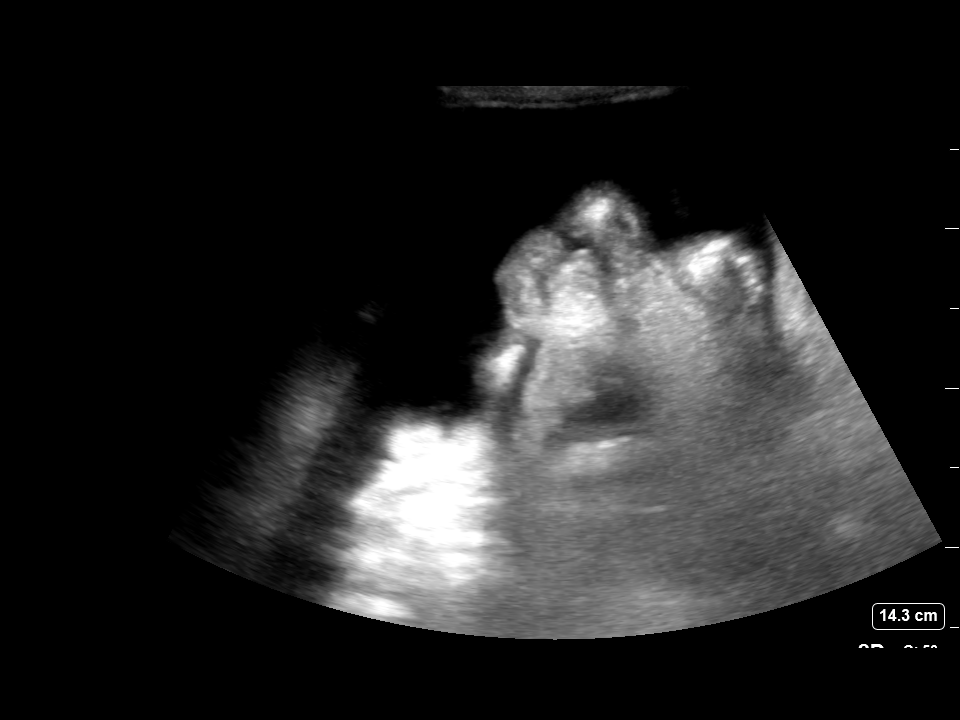
[im 2/3]
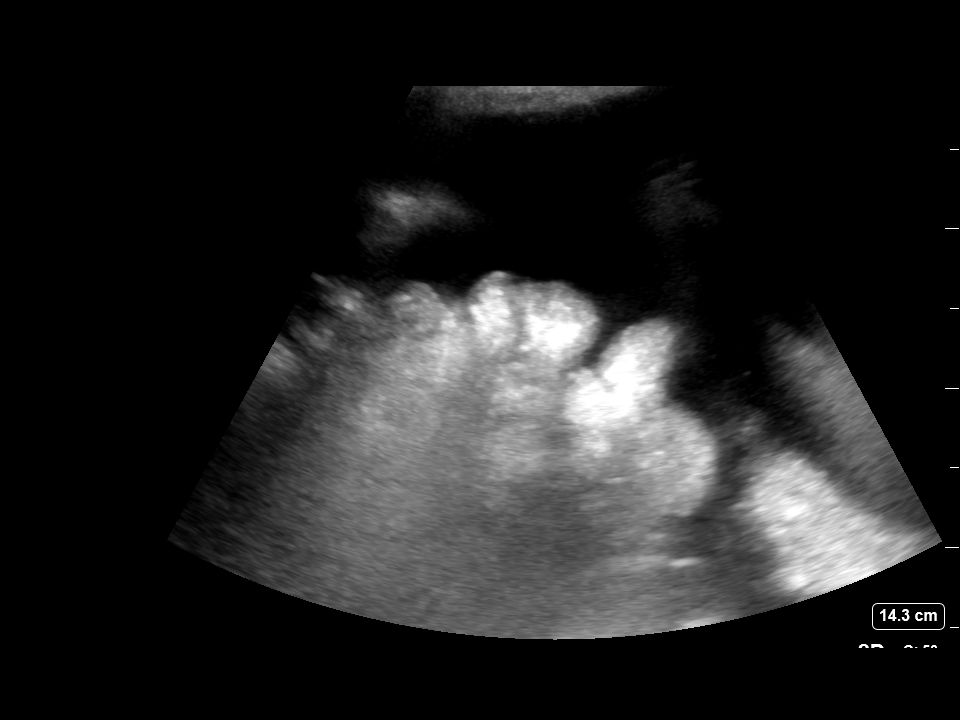
[im 3/3]
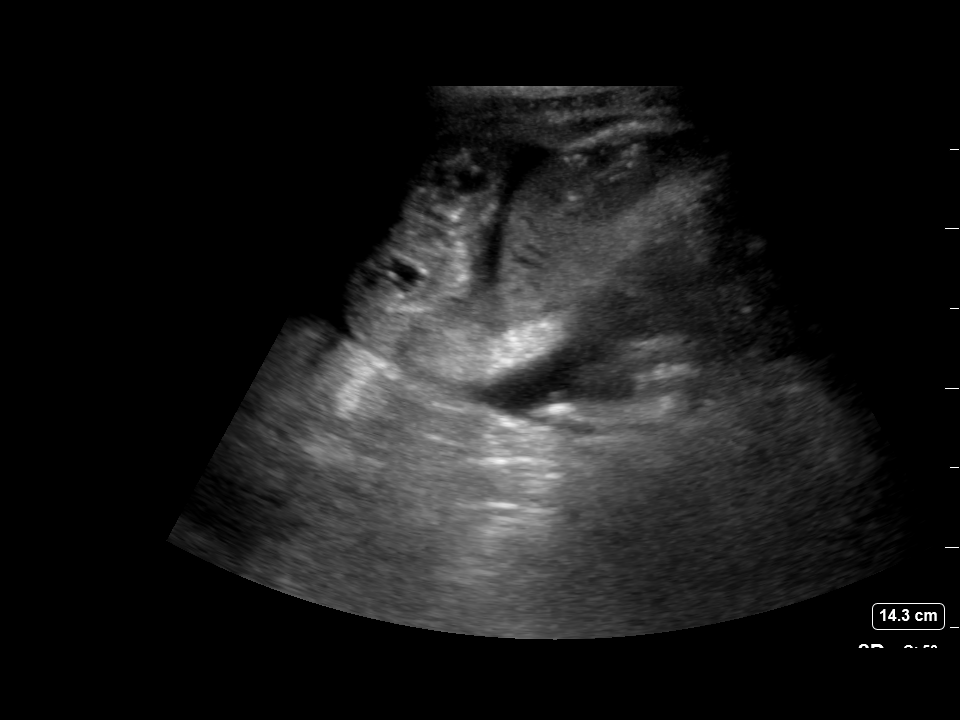

[3 of 3 positions shown; findings below may reference images not displayed]

EXAM:
ULTRASOUND GUIDED THERAPEUTIC PARACENTESIS

MEDICATIONS:
Lidocaine 1% 10 mL

COMPLICATIONS:
None immediate.

PROCEDURE:
Informed written consent was obtained from the patient after a
discussion of the risks, benefits and alternatives to treatment. A
timeout was performed prior to the initiation of the procedure.

Initial ultrasound scanning demonstrates a large amount of ascites
within the right lower abdominal quadrant. The right lower abdomen
was prepped and draped in the usual sterile fashion. 1% lidocaine
was used for local anesthesia.

Following this, a 19 gauge, 7-cm, Yueh catheter was introduced. An
ultrasound image was saved for documentation purposes. The
paracentesis was performed. The catheter was removed and a dressing
was applied. The patient tolerated the procedure well without
immediate post procedural complication.
Patient received post-procedure intravenous albumin; see nursing
notes for details.
FINDINGS: A total of approximately 6.2 L of straw-colored fluid was removed.
IMPRESSION: Successful ultrasound-guided therapeutic paracentesis yielding
liters of peritoneal fluid.

Read by: Vika Gehrke, NP

## 2022-07-30 ENCOUNTER — Encounter (HOSPITAL_COMMUNITY): Payer: Self-pay

## 2022-07-30 ENCOUNTER — Emergency Department (HOSPITAL_COMMUNITY): Payer: Medicare Other

## 2022-07-30 ENCOUNTER — Other Ambulatory Visit: Payer: Self-pay

## 2022-07-30 ENCOUNTER — Emergency Department (HOSPITAL_COMMUNITY)
Admission: EM | Admit: 2022-07-30 | Discharge: 2022-07-30 | Disposition: A | Discharge status: Home / Self Care | Payer: Medicare Other | Attending: Emergency Medicine | Admitting: Emergency Medicine

## 2022-07-30 DIAGNOSIS — R42 Dizziness and giddiness: Secondary | ICD-10-CM | POA: Diagnosis not present

## 2022-07-30 DIAGNOSIS — N186 End stage renal disease: Secondary | ICD-10-CM | POA: Insufficient documentation

## 2022-07-30 DIAGNOSIS — Z1152 Encounter for screening for COVID-19: Secondary | ICD-10-CM | POA: Insufficient documentation

## 2022-07-30 DIAGNOSIS — Z79899 Other long term (current) drug therapy: Secondary | ICD-10-CM | POA: Diagnosis not present

## 2022-07-30 DIAGNOSIS — I120 Hypertensive chronic kidney disease with stage 5 chronic kidney disease or end stage renal disease: Secondary | ICD-10-CM | POA: Insufficient documentation

## 2022-07-30 DIAGNOSIS — Z992 Dependence on renal dialysis: Secondary | ICD-10-CM | POA: Insufficient documentation

## 2022-07-30 DIAGNOSIS — Z7901 Long term (current) use of anticoagulants: Secondary | ICD-10-CM | POA: Insufficient documentation

## 2022-07-30 LAB — AMMONIA: Ammonia: <10 umol/L (ref 9–35)

## 2022-07-30 LAB — COMPREHENSIVE METABOLIC PANEL
ALT: 38 U/L (ref 0–44)
AST: 48 U/L — ABNORMAL HIGH (ref 15–41)
Albumin: 3.4 g/dL — ABNORMAL LOW (ref 3.5–5.0)
Alkaline Phosphatase: 217 U/L — ABNORMAL HIGH (ref 38–126)
Anion gap: 15 (ref 5–15)
BUN: 34 mg/dL — ABNORMAL HIGH (ref 8–23)
CO2: 24 mmol/L (ref 22–32)
Calcium: 8.5 mg/dL — ABNORMAL LOW (ref 8.9–10.3)
Chloride: 95 mmol/L — ABNORMAL LOW (ref 98–111)
Creatinine, Ser: 7.81 mg/dL — ABNORMAL HIGH (ref 0.61–1.24)
GFR, Estimated: 6 mL/min — ABNORMAL LOW (ref >60)
Glucose, Bld: 109 mg/dL — ABNORMAL HIGH (ref 70–99)
Potassium: 4.6 mmol/L (ref 3.5–5.1)
Sodium: 134 mmol/L — ABNORMAL LOW (ref 135–145)
Total Bilirubin: 1 mg/dL (ref 0.3–1.2)
Total Protein: 7 g/dL (ref 6.5–8.1)

## 2022-07-30 LAB — CBC WITH DIFFERENTIAL/PLATELET
Abs Immature Granulocytes: 0.04 10*3/uL (ref 0.00–0.07)
Basophils Absolute: 0.1 10*3/uL (ref 0.0–0.1)
Basophils Relative: 1 %
Eosinophils Absolute: 0.2 10*3/uL (ref 0.0–0.5)
Eosinophils Relative: 2 %
HCT: 34.5 % — ABNORMAL LOW (ref 39.0–52.0)
Hemoglobin: 10.9 g/dL — ABNORMAL LOW (ref 13.0–17.0)
Immature Granulocytes: 1 %
Lymphocytes Relative: 12 %
Lymphs Abs: 0.9 10*3/uL (ref 0.7–4.0)
MCH: 33.9 pg (ref 26.0–34.0)
MCHC: 31.6 g/dL (ref 30.0–36.0)
MCV: 107.1 fL — ABNORMAL HIGH (ref 80.0–100.0)
Monocytes Absolute: 0.6 10*3/uL (ref 0.1–1.0)
Monocytes Relative: 8 %
Neutro Abs: 5.9 10*3/uL (ref 1.7–7.7)
Neutrophils Relative %: 76 %
Platelets: 116 10*3/uL — ABNORMAL LOW (ref 150–400)
RBC: 3.22 MIL/uL — ABNORMAL LOW (ref 4.22–5.81)
RDW: 17.2 % — ABNORMAL HIGH (ref 11.5–15.5)
WBC: 7.6 10*3/uL (ref 4.0–10.5)
nRBC: 0 % (ref 0.0–0.2)

## 2022-07-30 LAB — SARS CORONAVIRUS 2 BY RT PCR: SARS Coronavirus 2 by RT PCR: NEGATIVE

## 2022-07-30 LAB — TROPONIN I (HIGH SENSITIVITY)
Troponin I (High Sensitivity): 21 ng/L — ABNORMAL HIGH (ref <18)
Troponin I (High Sensitivity): 18 ng/L — ABNORMAL HIGH (ref <18)

## 2022-07-30 NOTE — ED Provider Notes (Signed)
[place] [facility] AT [facility] Provider Note   CSN: [id number] Arrival date & time: 07/30/22  1238     History  Chief Complaint  Patient presents with   Dizziness    Ricky Harris is a 82 y.o. male.  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  He reports in the past the dizziness has been positional in nature and he had been doing better after starting midodrine but today after he sat down and got hooked up to the machine within 30 minutes he started feeling dizzy which she describes as lightheaded like feeling he is going to pass out.  He denies any vision changes, sensation of movement or spinning, nausea, vomiting, chest pain, shortness of breath or abdominal pain.  Patient's son is present in the room whom he lives with and he reports yesterday he was normal it was a good day for him and even this morning prior to starting dialysis he seemed to be his normal self.  Currently his son feels that he seems a little tired and not quite himself.  Patient denies any black stools and there is been no other medication changes.  Son did not know if the patient had low blood pressure while he was at dialysis.  EMS reported normal blood pressure from their arrival.  The history is provided by the patient, a caregiver and the EMS personnel.  Dizziness      Home Medications Prior to Admission medications   Medication Sig Start Date End Date Taking? Authorizing Provider  apixaban (ELIQUIS) 2.5 MG TABS tablet Take 1 tablet (2.5 mg total) by mouth 2 (two) times daily. 05/23/22   [place], Peter M, MD  atorvastatin  (LIPITOR) 40 MG tablet Take 1 tablet (40 mg total) by mouth daily. Patient taking differently: Take 40 mg by mouth at bedtime. 04/28/21   Noralee Stain, DO  cetirizine (ZYRTEC) 10 MG tablet Take 10 mg by mouth daily as needed for allergies.    [provider]  donepezil (ARICEPT) 5 MG tablet Take 5 mg by mouth at bedtime. 03/01/22   [provider]  levothyroxine (SYNTHROID) 50 MCG tablet Take 1 tablet (50 mcg total) by mouth daily. 04/27/21   Noralee Stain, DO  multivitamin (RENA-VIT) TABS tablet Take 1 tablet by mouth daily. Patient taking differently: Take 1 tablet by mouth at bedtime. 09/27/19   Autry-Lott, Randa Evens, DO  nitroGLYCERIN (NITROSTAT) 0.4 MG SL tablet Place 0.4 mg under the tongue every 5 (five) minutes as needed for chest pain.    [provider]  ondansetron (ZOFRAN) 4 MG tablet Take 4 mg by mouth every 8 (eight) hours as needed for nausea or vomiting. 05/10/22   [provider]  traMADol (ULTRAM) 50 MG tablet Take 1 tablet (50 mg total) by mouth every 8 (eight) hours as needed for moderate pain. 03/12/19   Rhyne, Ames Coupe, PA-C      Allergies    Cephalexin and Hydralazine    Review of Systems   Review of Systems  Respiratory:  Positive for cough.        Chronic cough which has been going on for years per son but  nothing new  Neurological:  Positive for dizziness.    Physical Exam Updated Vital Signs Temp 98.5 F (36.9 C) (Oral)   Ht 6' (1.829 m)   Wt 90.7 kg   BMI 27.12 kg/m  Physical Exam Vitals and nursing note reviewed.  Constitutional:      General: He is not in acute distress.    Appearance: He is well-developed.  HENT:     Head: Normocephalic and atraumatic.  Eyes:     General: No visual field deficit.    Conjunctiva/sclera: Conjunctivae normal.     Pupils: Pupils are equal, round, and reactive to light.  Cardiovascular:     Rate and Rhythm: Normal rate and regular rhythm.     Pulses: Normal pulses.     Heart sounds:  Murmur heard.  Pulmonary:     Effort: Pulmonary effort is normal. No respiratory distress.     Breath sounds: Normal breath sounds. No wheezing or rales.  Abdominal:     General: There is no distension.     Palpations: Abdomen is soft.     Tenderness: There is no abdominal tenderness. There is no guarding or rebound.  Musculoskeletal:        General: No tenderness. Normal range of motion.     Cervical back: Normal range of motion and neck supple.  Skin:    General: Skin is warm and dry.     Findings: No erythema or rash.  Neurological:     Mental Status: He is alert and oriented to person, place, and time. Mental status is at baseline.     Cranial Nerves: No dysarthria or facial asymmetry.     Sensory: Sensation is intact.     Motor: No weakness or pronator drift.     Coordination: Finger-Nose-Finger Test and Heel to [employer] normal.  Psychiatric:        Behavior: Behavior normal.     ED Results / Procedures / Treatments   Labs (all labs ordered are listed, but only abnormal results are displayed) Labs Reviewed  CBC WITH DIFFERENTIAL/PLATELET  COMPREHENSIVE METABOLIC PANEL  TROPONIN I (HIGH SENSITIVITY)    EKG EKG Interpretation  Date/Time:  Saturday July 30 2022 12:46:53 EDT Ventricular Rate:  64 PR Interval:  192 QRS Duration: 128 QT Interval:  419 QTC Calculation: 433 R Axis:   -51 Text Interpretation: Sinus rhythm Left bundle branch block No significant change since last tracing Confirmed by Gwyneth Sprout ([id number]) on 07/30/2022 12:52:25 PM  Radiology DG Chest Port 1 View  Result Date: 07/30/2022 CLINICAL DATA:  Dizziness. EXAM: PORTABLE CHEST 1 VIEW COMPARISON:  CT, 07/06/2022 as well as portable chest radiograph. FINDINGS: Cardiac silhouette normal in size.  No mediastinal or hilar masses. Mild linear opacity at the left lung base above an elevated hemidiaphragm, consistent with atelectasis. Remainder of the lungs is clear. No convincing pleural effusion.  No  pneumothorax. Skeletal structures are grossly intact. IMPRESSION: No active disease. Electronically Signed   By: Amie Portland M.D.   On: 07/30/2022 13:37    Procedures Procedures    Medications Ordered in ED Medications - No data to display  ED Course/ Medical Decision Making/ A&P                             Medical Decision Making Amount and/or Complexity of Data Reviewed Independent Historian: caregiver External Data Reviewed: notes. Labs: ordered. Decision-making details documented in ED Course. Radiology: ordered and independent  interpretation performed. Decision-making details documented in ED Course. ECG/medicine tests: ordered and independent interpretation performed. Decision-making details documented in ED Course.   Pt with multiple medical problems and comorbidities and presenting today with a complaint that caries a high risk for morbidity and mortality.  Here with an complaint of dizziness.  Patient's symptoms today are vague.  No focal findings on exam.  No focal neurologic findings to suggest stroke or central vertigo.  He reports that the closest he can describe is it feels like he is going to pass out.  Patient's blood pressure with lying is normal.  He is not complaining of symptoms concerning for new infectious pathology.  He denies any black stools but concern for possible anemia, electrolyte abnormality, new atypical MI.  I independently interpreted patient's EKG and patient's EKG does show a left bundle branch block which does not appear different from prior EKGs.  O2 sats are 100% on room air patient does not appear short of breath and lower suspicion for fluid overload at this time.  Findings discussed with the patient and his son and the plan. I independently interpreted patient's labs and CBC with stable hemoglobin of 10.9, CMP with normal electrolytes, initial troponin of 21 and negative ammonia.  I have independently visualized and interpreted pt's images today.   Chest x-ray without acute findings.  At this time there is no specific cause for patient's dizziness.  Will need delta troponin.  Patient checked out to Dr. Tiburcio Pea.  Blood pressures have remained stable here.         Final Clinical Impression(s) / ED Diagnoses Final diagnoses:  None    Rx / DC Orders ED Discharge Orders     None         Gwyneth Sprout, MD 07/30/22 1652

## 2022-07-30 NOTE — ED Triage Notes (Signed)
PT BIB [facility], picked up at dialysis for dizziness, received about an hour of treatment.    126/68 Hr 68 97% RA CBG 155

## 2022-07-30 NOTE — ED Provider Notes (Signed)
82 yo male w/ ESRD on dialysis presenting with lightheadedness, onset during dialysis today  Hx of hypotension, on midodrine, took today  Trop 21, at baseline. CMP near baseline levels. Hgb 10.9  No hypotension here    Physical Exam  BP 122/63   Pulse (!) 55   Temp 98.7 F (37.1 C) (Oral)   Resp 19   Ht 6' (1.829 m)   Wt 90.7 kg   SpO2 98%   BMI 27.12 kg/m   Physical Exam  Procedures  Procedures  ED Course / MDM   Clinical Course as of 07/30/22 1851  Sat Jul 30, 2022  1827 Patient reassessed at bedside.  He is feeling much, much better, denies any active lightheadedness.  He is wanting to go home.  Will make sure that he is stable on his feet, and if so, he could be discharged home.  Typically he walks with a walker.  [MT]  1850 Patient stable on his feet at baseline according to his son.  Okay for discharge [MT]    Clinical Course User Index [MT] Teniya Filter, Kermit Balo, MD   Medical Decision Making Amount and/or Complexity of Data Reviewed Labs: ordered. Radiology: ordered.         Terald Sleeper, MD 07/30/22 [phone]

## 2022-07-30 NOTE — ED Notes (Signed)
Patient walked steadily down the hall with a walker, son states patient at baseline.

## 2022-08-24 ENCOUNTER — Ambulatory Visit
Admission: RE | Admit: 2022-08-24 | Discharge: 2022-08-24 | Disposition: A | Discharge status: Home / Self Care | Payer: Medicare Other | Source: Ambulatory Visit | Attending: Interventional Radiology | Admitting: Interventional Radiology

## 2022-08-24 DIAGNOSIS — I9789 Other postprocedural complications and disorders of the circulatory system, not elsewhere classified: Secondary | ICD-10-CM

## 2022-08-24 HISTORY — PX: IR RADIOLOGIST EVAL & MGMT: IMG5224

## 2022-08-24 NOTE — Progress Notes (Signed)
Chief Complaint: Follow up Endoleak repair   Referring Physician(s): Matthews,Kacie Sue-Ellen   VVS: Dr. Heath Lark PCP: Dr. Shary Decamp, Rosalita Levan    History of Present Illness: Ricky Harris is an 82 y.o. male presenting today as a scheduled follow up with VIR clinic, SP type II endoleak repair   Today, Ricky Harris' telemedicine visit was navigated by his daughter-in-law Lelon Mast on the phone.  We confirmed his identity with 2 personal identifiers.    Hx: Ricky Harris presented to the ED at [facility] 11/04/21 with complaint of right chest/upper abdominal pain.  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  He was discharged home with follow up .    He has history of MI, and he was not a candidate for revascularization.  He has no history of stroke.    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  Main body from the right (surgical exposure), 26mm x 14mm x 12cm.  Left side 16mm x 13.5cm, and right extension was 16mm x 9.5cm. CIA landing zone bilaterally.     Baseline diameter of the excluded aneurysm was ~5.5cm on CT 03/22/19.  The diameter at the time of treatment is estimated 7.1cm on CT 11/04/21.   We treated Ricky Harris 12/03/21 with MAC.  Angiogram was performed, with navigation of microcatheter through the IMA to the sac.  There was lack retrograde flow through the IMA during angio, so it is now uncertain the exact source.  The aortic and pelvic angio did not reveal a definite source.    We did navigate to the sac, with embolization of the sac with combo thrombin and coil embo. He was discharged after short recovery, with CELT closure.     Interval Hx:  Today, his daughter tells me that he is doing fine, and has no complaints.  Denies any complaints of abdominal pain or other. He continues to perform his daily activities.   His daughter does voice some concerns  for multiple doctors appointments and visits to the hospital because of his frailty.   He was hospitalized late April/early May, with diagnosis of flu.  He has recovered.    Also, he was seen in the ED with some pain 07/06/22, and a CTA was completed with concern for acute aortic syndrome. This was negative.  The read suggests ongoing endoleak, however, the study is protocoled without a non-contrast phase, and without a delay.  I think the interpretation is incorrect, and is based on the visualization of calcification in the wall of the aneurysm sac.  I see no evidence of endoleak.  Also, the size is essentially unchanged, by my measurement today about 7.2cm, which is about the same as at the time of our treatment. .     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[facility])    Coronary artery disease    on CT scan  Gout    H/O hypercholesterolemia    Heart murmur    History of falling    recent- see ct chest results of 04/25/2018   Hypertension    Hypothyroidism    Liver cirrhosis (HCC)    Myocardial infarction (HCC)    NSTEMI 04/25/21, treated medically   Prediabetes    pt denies    Renal artery stenosis (HCC)    Short-term memory loss    Thrombocytopenia (HCC)    Wears glasses     Past Surgical History:  Procedure Laterality Date   ABDOMINAL AORTIC ENDOVASCULAR STENT GRAFT N/A 01/21/2019   Procedure: ABDOMINAL AORTIC ENDOVASCULAR STENT GRAFT WITH CO2;  Surgeon: Chuck Hint, MD;  Location: [facility] OR;  Service: Vascular;  Laterality: N/A;   ABDOMINAL AORTOGRAM W/LOWER EXTREMITY Bilateral 12/14/2018   Procedure: ABDOMINAL AORTOGRAM W/LOWER  EXTREMITY;  Surgeon: Chuck Hint, MD;  Location: [facility] INVASIVE CV LAB;  Service: Cardiovascular;  Laterality: Bilateral;   AV FISTULA PLACEMENT Left 03/12/2019   Procedure: LEFT ARTERIOVENOUS Arteriovenous FISTULA CREATION.;  Surgeon: Chuck Hint, MD;  Location: [facility] OR;  Service: Vascular;  Laterality: Left;   CATARACT EXTRACTION W/ INTRAOCULAR LENS  IMPLANT, BILATERAL     CHOLECYSTECTOMY     ESOPHAGEAL BANDING N/A 08/11/2017   Procedure: ESOPHAGEAL BANDING;  Surgeon: Kathi Der, MD;  Location: [facility] ENDOSCOPY;  Service: Gastroenterology;  Laterality: N/A;   ESOPHAGEAL BANDING N/A 10/26/2017   Procedure: ESOPHAGEAL BANDING;  Surgeon: Kathi Der, MD;  Location: [facility] ENDOSCOPY;  Service: Gastroenterology;  Laterality: N/A;   ESOPHAGEAL BANDING N/A 12/19/2017   Procedure: ESOPHAGEAL BANDING;  Surgeon: Kathi Der, MD;  Location: [facility];  Service: Gastroenterology;  Laterality: N/A;   ESOPHAGOGASTRODUODENOSCOPY (EGD) WITH PROPOFOL N/A 07/05/2017   Procedure: ESOPHAGOGASTRODUODENOSCOPY (EGD) WITH PROPOFOL;  Surgeon: Kathi Der, MD;  Location: [facility];  Service: Gastroenterology;  Laterality: N/A;   ESOPHAGOGASTRODUODENOSCOPY (EGD) WITH PROPOFOL N/A 08/11/2017   Procedure: ESOPHAGOGASTRODUODENOSCOPY (EGD) WITH PROPOFOL;  Surgeon: Kathi Der, MD;  Location: [facility];  Service: Gastroenterology;  Laterality: N/A;   ESOPHAGOGASTRODUODENOSCOPY (EGD) WITH PROPOFOL N/A 10/26/2017   Procedure: ESOPHAGOGASTRODUODENOSCOPY (EGD) WITH PROPOFOL;  Surgeon: Kathi Der, MD;  Location: [facility];  Service: Gastroenterology;  Laterality: N/A;   ESOPHAGOGASTRODUODENOSCOPY (EGD) WITH PROPOFOL N/A 12/19/2017   Procedure: ESOPHAGOGASTRODUODENOSCOPY (EGD) WITH PROPOFOL;  Surgeon: Kathi Der, MD;  Location: [facility];  Service: Gastroenterology;  Laterality: N/A;   ESOPHAGOGASTRODUODENOSCOPY (EGD) WITH PROPOFOL N/A 10/29/2018   Procedure:  ESOPHAGOGASTRODUODENOSCOPY (EGD) WITH PROPOFOL;  Surgeon: Kathi Der, MD;  Location: [facility];  Service: Gastroenterology;  Laterality: N/A;   ESOPHAGOGASTRODUODENOSCOPY (EGD) WITH PROPOFOL N/A 02/14/2020   Procedure: ESOPHAGOGASTRODUODENOSCOPY (EGD) WITH PROPOFOL;  Surgeon: Kathi Der, MD;  Location: [facility];  Service: Gastroenterology;  Laterality: N/A;   EYE SURGERY     bilateral cataract removal with lens placement   HERNIA REPAIR     IR ANGIOGRAM PULMONARY BILATERAL SELECTIVE  12/03/2021   IR ANGIOGRAM SELECTIVE EACH ADDITIONAL VESSEL  12/03/2021   IR ANGIOGRAM VISCERAL SELECTIVE  12/03/2021   IR ANGIOGRAM VISCERAL SELECTIVE  12/03/2021   IR ANGIOGRAM VISCERAL SELECTIVE  12/03/2021   IR CT SPINE LTD  12/03/2021   IR PARACENTESIS  12/26/2018   IR PARACENTESIS  01/02/2019   IR PARACENTESIS  01/15/2019   IR PARACENTESIS  01/30/2019   IR PARACENTESIS  02/05/2019   IR PARACENTESIS  02/14/2019   IR PARACENTESIS  02/28/2019   IR PARACENTESIS  03/14/2019   IR PARACENTESIS  03/29/2019   IR PARACENTESIS  04/11/2019   IR PARACENTESIS  04/25/2019   IR PARACENTESIS  05/09/2019   IR PARACENTESIS  06/03/2019   IR PARACENTESIS  06/06/2019   IR PARACENTESIS  06/20/2019   IR PARACENTESIS  07/03/2019   IR PARACENTESIS  07/19/2019   IR PARACENTESIS  08/08/2019   IR PARACENTESIS  08/27/2019   IR PARACENTESIS  09/09/2019   IR PARACENTESIS  09/24/2019   IR PARACENTESIS  10/07/2019   IR PARACENTESIS  10/23/2019   IR PARACENTESIS  11/07/2019   IR PARACENTESIS  11/21/2019   IR PARACENTESIS  12/04/2019   IR PARACENTESIS  12/25/2019   IR PARACENTESIS  01/10/2020   IR PARACENTESIS  01/27/2020   IR PARACENTESIS  02/19/2020   IR PARACENTESIS  03/27/2020   IR RADIOLOGIST EVAL & MGMT  11/16/2021   IR TRANSCATH/EMBOLIZ  12/03/2021   IR US GUIDE VASC ACCESS RIGHT  12/03/2021   PROSTATECTOMY     RADIOLOGY WITH ANESTHESIA N/A 12/03/2021   Procedure: Type II Endoleak;  Surgeon: Gilmer Mor, DO;  Location: [facility] OR;   Service: Anesthesiology;  Laterality: N/A;   RIGHT AND LEFT HEART CATH N/A 09/18/2020   Procedure: RIGHT AND LEFT HEART CATH;  Surgeon: Lennette Bihari, MD;  Location: [facility] INVASIVE CV LAB;  Service: Cardiovascular;  Laterality: N/A;   subtotal gastrectomy      Allergies: Cephalexin and Hydralazine  Medications: Prior to Admission medications   Medication Sig Start Date End Date Taking? Authorizing Provider  apixaban (ELIQUIS) 2.5 MG TABS tablet Take 1 tablet (2.5 mg total) by mouth 2 (two) times daily. 05/23/22   [place], Peter M, MD  atorvastatin (LIPITOR) 40 MG tablet Take 1 tablet (40 mg total) by mouth daily. Patient taking differently: Take 40 mg by mouth at bedtime. 04/28/21   Noralee Stain, DO  cetirizine (ZYRTEC) 10 MG tablet Take 10 mg by mouth daily as needed for allergies.    [provider]  donepezil (ARICEPT) 5 MG tablet Take 5 mg by mouth at bedtime. 03/01/22   [provider]  levothyroxine (SYNTHROID) 50 MCG tablet Take 1 tablet (50 mcg total) by mouth daily. 04/27/21   Noralee Stain, DO  multivitamin (RENA-VIT) TABS tablet Take 1 tablet by mouth daily. Patient taking differently: Take 1 tablet by mouth at bedtime. 09/27/19   Autry-Lott, Randa Evens, DO  nitroGLYCERIN (NITROSTAT) 0.4 MG SL tablet Place 0.4 mg under the tongue every 5 (five) minutes as needed for chest pain.    [provider]  ondansetron (ZOFRAN) 4 MG tablet Take 4 mg by mouth every 8 (eight) hours as needed for nausea or vomiting. 05/10/22   [provider]  traMADol (ULTRAM) 50 MG tablet Take 1 tablet (50 mg total) by mouth every 8 (eight) hours as needed for moderate pain. 03/12/19   Rhyne, Ames Coupe, PA-C     Family History  Problem Relation Age of Onset   Diabetes Mellitus II Mother    Stroke Mother     Social History   Socioeconomic History   Marital status: Divorced    Spouse name: Not on file   Number of children: Not on file   Years of education: Not on file    Highest education level: Not on file  Occupational History   Not on file  Tobacco Use   Smoking status: Never   Smokeless tobacco: Never  Vaping Use   Vaping Use: Never used  Substance and Sexual Activity   Alcohol use: Not Currently   Drug use: Never  Sexual activity: Not on file  Other Topics Concern   Not on file  Social History Narrative   Not on file   Social Determinants of Health   Financial Resource Strain: Not on file  Food Insecurity: Not on file  Transportation Needs: Not on file  Physical Activity: Not on file  Stress: Not on file  Social Connections: Not on file      Review of Systems  Review of Systems: A 12 point ROS discussed and pertinent positives are indicated in the HPI above.  All other systems are negative.  Advance Care Plan: The advanced care plan/surrogate decision maker was discussed at the time of visit and documented in the medical record.    Physical Exam No direct physical exam was performed (except for noted visual exam findings with Video Visits).    Vital Signs: There were no vitals taken for this visit.  Imaging: CT Head Wo Contrast  Result Date: 07/30/2022 CLINICAL DATA:  Syncope, presyncope EXAM: CT HEAD WITHOUT CONTRAST TECHNIQUE: Contiguous axial images were obtained from the base of the skull through the vertex without intravenous contrast. RADIATION DOSE REDUCTION: This exam was performed according to the departmental dose-optimization program which includes automated exposure control, adjustment of the mA and/or kV according to patient size and/or use of iterative reconstruction technique. COMPARISON:  06/24/2022 FINDINGS: Brain: No acute intracranial findings are seen in noncontrast CT brain. Cortical sulci are prominent. There is decreased density in periventricular and subcortical white matter. There are possible tiny old lacunar infarcts in the basal ganglia. There are no signs of bleeding within the cranium. Vascular: Scattered  arterial calcifications are seen. Skull: No acute findings are seen. Sinuses/Orbits: Unremarkable. Other: None. IMPRESSION: No acute intracranial findings are seen in noncontrast CT brain. Atrophy. Small vessel disease. Electronically Signed   By: Ernie Avena M.D.   On: 07/30/2022 14:42   DG Chest Port 1 View  Result Date: 07/30/2022 CLINICAL DATA:  Dizziness. EXAM: PORTABLE CHEST 1 VIEW COMPARISON:  CT, 07/06/2022 as well as portable chest radiograph. FINDINGS: Cardiac silhouette normal in size.  No mediastinal or hilar masses. Mild linear opacity at the left lung base above an elevated hemidiaphragm, consistent with atelectasis. Remainder of the lungs is clear. No convincing pleural effusion.  No pneumothorax. Skeletal structures are grossly intact. IMPRESSION: No active disease. Electronically Signed   By: Amie Portland M.D.   On: 07/30/2022 13:37    Labs:  CBC: Recent Labs    06/27/22 0237 06/29/22 0656 07/06/22 1900 07/30/22 1454  WBC 7.2 6.3 8.4 7.6  HGB 9.6* 8.8* 9.9* 10.9*  HCT 28.5* 26.8* 29.9* 34.5*  PLT 99* 92* 131* 116*    COAGS: Recent Labs    10/23/21 2255 11/04/21 1432  INR 1.3* 1.3*  APTT 31 33    BMP: Recent Labs    06/27/22 0237 06/29/22 0732 07/06/22 1900 07/30/22 1454  NA 135 135 135 134*  K 3.8 3.8 5.0 4.6  CL 93* 96* 97* 95*  CO2 25 22 27 24   GLUCOSE 113* 96 106* 109*  BUN 47* 69* 26* 34*  CALCIUM 8.4* 8.0* 8.7* 8.5*  CREATININE 8.42* 11.71* 6.59* 7.81*  GFRNONAA 6* 4* 8* 6*    LIVER FUNCTION TESTS: Recent Labs    06/24/22 0852 06/25/22 0309 06/26/22 0214 06/27/22 0237 06/29/22 0732 07/06/22 1900 07/30/22 1454  BILITOT 0.7 1.4*  --   --   --  1.1 1.0  AST 27 36  --   --   --  37 48*  ALT 18 25  --   --   --  29 38  ALKPHOS 106 149*  --   --   --  173* 217*  PROT 4.6* 6.6  --   --   --  7.3 7.0  ALBUMIN 2.3* 3.0*   < > 3.4* 2.8* 3.2* 3.4*   < > = values in this interval not displayed.    TUMOR MARKERS: No results for  input(s): "AFPTM", "CEA", "CA199", "CHROMGRNA" in the last 8760 hours.  Assessment and Plan:   Ricky Horger is 82 yo male with multiple CV risk factors, with prior EVAR for AAA and type II endoleak, now SP trans-arterial embolization 10/6.    He is doing fine at this point.  .     I emphasized with his daughter in law today that the standard of care is continue to observe him, given our knowledge of the natural history of EVAR/Type II endoleaks, and the recognized occasional need for multiple secondary treatments.  They understand, though do voice some appropriate concerns about multiple physician visits given his frailty.  I think reasonable to follow up in ~24 months, as there is relative stability of the sac, no evidence of endoleak on most recent CTA (discussed above), and he is symptom-free.    Plan: - 24 month follow up office visit with non-contrast abdominal/pelvic CT with Dr. Loreta Ave.  - continue current care  Electronically Signed: Gilmer Mor 08/24/2022, 11:56 AM   I spent a total of    25 Minutes in remote  clinical consultation, greater than 50% of which was counseling/coordinating care for endoleak, SP embolization.    Visit type: Audio only (telephone). Audio (no video) only due to patient's lack of internet/smartphone capability. Alternative for in-person consultation at [facility], [address], [place], [place]. This visit type was conducted due to national recommendations for restrictions regarding the COVID-19 Pandemic (e.g. social distancing).  This format is felt to be most appropriate for this patient at this time.  All issues noted in this document were discussed and addressed.

## 2022-10-11 ENCOUNTER — Emergency Department (HOSPITAL_COMMUNITY): Payer: Medicare Other

## 2022-10-11 ENCOUNTER — Other Ambulatory Visit: Payer: Self-pay

## 2022-10-11 ENCOUNTER — Emergency Department (HOSPITAL_COMMUNITY)
Admission: EM | Admit: 2022-10-11 | Discharge: 2022-10-11 | Disposition: A | Discharge status: Home / Self Care | Payer: Medicare Other | Attending: Emergency Medicine | Admitting: Emergency Medicine

## 2022-10-11 DIAGNOSIS — M545 Low back pain, unspecified: Secondary | ICD-10-CM | POA: Insufficient documentation

## 2022-10-11 DIAGNOSIS — Z7901 Long term (current) use of anticoagulants: Secondary | ICD-10-CM | POA: Insufficient documentation

## 2022-10-11 DIAGNOSIS — F0390 Unspecified dementia without behavioral disturbance: Secondary | ICD-10-CM | POA: Diagnosis not present

## 2022-10-11 LAB — CBC WITH DIFFERENTIAL/PLATELET
Abs Immature Granulocytes: 0.03 10*3/uL (ref 0.00–0.07)
Basophils Absolute: 0.1 10*3/uL (ref 0.0–0.1)
Basophils Relative: 1 %
Eosinophils Absolute: 0.1 10*3/uL (ref 0.0–0.5)
Eosinophils Relative: 1 %
HCT: 34 % — ABNORMAL LOW (ref 39.0–52.0)
Hemoglobin: 10.9 g/dL — ABNORMAL LOW (ref 13.0–17.0)
Immature Granulocytes: 0 %
Lymphocytes Relative: 10 %
Lymphs Abs: 0.7 10*3/uL (ref 0.7–4.0)
MCH: 33.3 pg (ref 26.0–34.0)
MCHC: 32.1 g/dL (ref 30.0–36.0)
MCV: 104 fL — ABNORMAL HIGH (ref 80.0–100.0)
Monocytes Absolute: 0.6 10*3/uL (ref 0.1–1.0)
Monocytes Relative: 9 %
Neutro Abs: 5.7 10*3/uL (ref 1.7–7.7)
Neutrophils Relative %: 79 %
Platelets: 117 10*3/uL — ABNORMAL LOW (ref 150–400)
RBC: 3.27 MIL/uL — ABNORMAL LOW (ref 4.22–5.81)
RDW: 15.3 % (ref 11.5–15.5)
WBC: 7.2 10*3/uL (ref 4.0–10.5)
nRBC: 0 % (ref 0.0–0.2)

## 2022-10-11 LAB — BASIC METABOLIC PANEL
Anion gap: 14 (ref 5–15)
BUN: 67 mg/dL — ABNORMAL HIGH (ref 8–23)
CO2: 26 mmol/L (ref 22–32)
Calcium: 8.6 mg/dL — ABNORMAL LOW (ref 8.9–10.3)
Chloride: 94 mmol/L — ABNORMAL LOW (ref 98–111)
Creatinine, Ser: 10.02 mg/dL — ABNORMAL HIGH (ref 0.61–1.24)
GFR, Estimated: 5 mL/min — ABNORMAL LOW (ref >60)
Glucose, Bld: 118 mg/dL — ABNORMAL HIGH (ref 70–99)
Potassium: 5.3 mmol/L — ABNORMAL HIGH (ref 3.5–5.1)
Sodium: 134 mmol/L — ABNORMAL LOW (ref 135–145)

## 2022-10-11 MED ORDER — ACETAMINOPHEN 500 MG PO TABS
1000.0000 mg | ORAL_TABLET | Freq: Once | ORAL | Status: AC
  Administered 2022-10-11: 1000 mg via ORAL
  Filled 2022-10-11: qty 2

## 2022-10-11 MED ORDER — SODIUM CHLORIDE (PF) 0.9 % IJ SOLN
INTRAMUSCULAR | Status: AC
  Filled 2022-10-11: qty 50

## 2022-10-11 MED ORDER — PREDNISONE 10 MG PO TABS: 20.0000 mg | ORAL_TABLET | Freq: Every day | ORAL | 0 refills | Status: AC

## 2022-10-11 MED ORDER — KETOROLAC TROMETHAMINE 30 MG/ML IJ SOLN
15.0000 mg | Freq: Once | INTRAMUSCULAR | Status: AC
  Administered 2022-10-11: 15 mg via INTRAVENOUS
  Filled 2022-10-11: qty 1

## 2022-10-11 MED ORDER — IOHEXOL 350 MG/ML SOLN
100.0000 mL | Freq: Once | INTRAVENOUS | Status: AC | PRN
  Administered 2022-10-11: 100 mL via INTRAVENOUS

## 2022-10-11 NOTE — Discharge Instructions (Addendum)
Please call your vascular surgeon tomorrow to set up a follow-up appointment.  You can tell them that you were seen in the urgency department, and on your CT scan there was a small increase in the size of your known aneurysm.  You can take 1000 mg of Tylenol every 8 hours, 20 mg of prednisone for the next 4 days.  Please call your dialysis center tomorrow to see if you can get dialysis tomorrow.

## 2022-10-11 NOTE — ED Triage Notes (Signed)
Pt and daughter in law report pt having x3 days of lower back pain. DIL concerned it is kidney related as pt does not produce urine at baseline. Missed dialysis today due to pain. Pt denies injury or falls.

## 2022-10-11 NOTE — Progress Notes (Signed)
   This pt is active with [facility] which is a home based palliative care program provided by [facility].   Norm Parcel RN [phone]

## 2022-10-11 NOTE — ED Provider Notes (Signed)
[facility] AT [facility] Provider Note   CSN: [id number] Arrival date & time: 10/11/22  1030     History  Chief Complaint  Patient presents with   Back Pain    Ricky Harris is a 82 y.o. male.  This is a 82 year old male here today for back pain.  Patient says that he first noticed it when he was laying in bed 2 days ago.  He says the pain has not gotten better.  He has a past medical history significant for renal failure on Tuesday Thursday Saturday dialysis.  He did not go to dialysis today.  He also has a history of dementia.  He is here with his daughter-in-law.  Denies fever or chills.  Denies any urinary symptoms.   Back Pain      Home Medications Prior to Admission medications   Medication Sig Start Date End Date Taking? Authorizing Provider  predniSONE (DELTASONE) 10 MG tablet Take 2 tablets (20 mg total) by mouth daily for 4 days. 10/11/22 10/15/22 Yes Anders Simmonds T, DO  apixaban (ELIQUIS) 2.5 MG TABS tablet Take 1 tablet (2.5 mg total) by mouth 2 (two) times daily. 05/23/22   [place], Peter M, MD  atorvastatin (LIPITOR) 40 MG tablet Take 1 tablet (40 mg total) by mouth daily. Patient taking differently: Take 40 mg by mouth at bedtime. 04/28/21   Noralee Stain, DO  cetirizine (ZYRTEC) 10 MG tablet Take 10 mg by mouth daily as needed for allergies.    [provider]  donepezil (ARICEPT) 5 MG tablet Take 5 mg by mouth at bedtime. 03/01/22   [provider]  levothyroxine (SYNTHROID) 50 MCG tablet Take 1 tablet (50 mcg total) by mouth daily. 04/27/21   Noralee Stain, DO  multivitamin (RENA-VIT) TABS tablet Take 1 tablet by mouth daily. Patient taking differently: Take 1 tablet by mouth at bedtime. 09/27/19   Autry-Lott, Randa Evens, DO  nitroGLYCERIN (NITROSTAT) 0.4 MG SL tablet Place 0.4 mg under the tongue every 5 (five) minutes as needed for chest pain.    [provider]  ondansetron (ZOFRAN) 4 MG tablet Take 4 mg by  mouth every 8 (eight) hours as needed for nausea or vomiting. 05/10/22   [provider]  traMADol (ULTRAM) 50 MG tablet Take 1 tablet (50 mg total) by mouth every 8 (eight) hours as needed for moderate pain. 03/12/19   Rhyne, Ames Coupe, PA-C      Allergies    Cephalexin and Hydralazine    Review of Systems   Review of Systems  Musculoskeletal:  Positive for back pain.    Physical Exam Updated Vital Signs BP 124/74 (BP Location: Right Arm)   Pulse 66   Temp 98.7 F (37.1 C) (Oral)   Resp 17   Ht 6' (1.829 m)   Wt 84.4 kg   SpO2 95%   BMI 25.23 kg/m  Physical Exam Vitals reviewed.  HENT:     Head: Normocephalic and atraumatic.  Cardiovascular:     Rate and Rhythm: Normal rate.  Abdominal:     Palpations: Abdomen is soft.  Neurological:     General: No focal deficit present.     Mental Status: He is alert. Mental status is at baseline.     Comments: Intact sensation in the bilateral lower extremities.  5 out of 5 strength with plantarflexion, dorsiflexion, straight leg raise     ED Results / Procedures / Treatments   Labs (all labs ordered are listed,  but only abnormal results are displayed) Labs Reviewed  CBC WITH DIFFERENTIAL/PLATELET - Abnormal; Notable for the following components:      Result Value   RBC 3.27 (*)    Hemoglobin 10.9 (*)    HCT 34.0 (*)    MCV 104.0 (*)    Platelets 117 (*)    All other components within normal limits  BASIC METABOLIC PANEL - Abnormal; Notable for the following components:   Sodium 134 (*)    Potassium 5.3 (*)    Chloride 94 (*)    Glucose, Bld 118 (*)    BUN 67 (*)    Creatinine, Ser 10.02 (*)    Calcium 8.6 (*)    GFR, Estimated 5 (*)    All other components within normal limits    EKG None  Radiology CT Lumbar Spine Wo Contrast  Result Date: 10/11/2022 CLINICAL DATA:  Back trauma, no prior imaging (Age >= 16y) Pt and daughter in law report pt having x3 days of lower back pain. DIL concerned it is kidney  related as pt does not produce urine at baseline. Missed dialysis today due to pain. Pt denies injury or falls. EXAM: CT LUMBAR SPINE WITHOUT CONTRAST TECHNIQUE: Multidetector CT imaging of the lumbar spine was performed without intravenous contrast administration. Multiplanar CT image reconstructions were also generated. RADIATION DOSE REDUCTION: This exam was performed according to the departmental dose-optimization program which includes automated exposure control, adjustment of the mA and/or kV according to patient size and/or use of iterative reconstruction technique. COMPARISON:  CT abdomen pelvis 11/04/2021 FINDINGS: Segmentation: 5 lumbar type vertebrae. Alignment: Grade 1 anterolisthesis of L4 on L5 and L5 on S1. Dextroscoliosis of the spine centered at the L4-L5 level. Vertebrae: Multilevel moderate severe degenerative changes of the spine with no associated severe osseous neural foraminal or central canal stenosis. No acute fracture or focal pathologic process. Paraspinal and other soft tissues: Negative. Disc levels: Intervertebral disc space vacuum phenomenon at the L3-L4 and L4-L5 level. IMPRESSION: No acute displaced fracture or traumatic listhesis of the lumbar spine. Electronically Signed   By: Tish Frederickson M.D.   On: 10/11/2022 14:30   CT Angio Abd/Pel W and/or Wo Contrast  Result Date: 10/11/2022 CLINICAL DATA:  Abdominal aortic aneurysm (AAA), follow up Pt and daughter in law report pt having x3 days of lower back pain. DIL concerned it is kidney related as pt does not produce urine at baseline. Missed dialysis today due to pain. Pt denies injury or falls. EXAM: CTA ABDOMEN AND PELVIS WITHOUT AND WITH CONTRAST TECHNIQUE: Multidetector CT imaging of the abdomen and pelvis was performed using the standard protocol during bolus administration of intravenous contrast. Multiplanar reconstructed images and MIPs were obtained and reviewed to evaluate the vascular anatomy. RADIATION DOSE  REDUCTION: This exam was performed according to the departmental dose-optimization program which includes automated exposure control, adjustment of the mA and/or kV according to patient size and/or use of iterative reconstruction technique. CONTRAST:  OMNIPAQUE IOHEXOL 350 MG/ML SOLN COMPARISON:  CT angiography chest abdomen pelvis 07/06/2022, CT abdomen pelvis 11/04/2021, CT abdomen pelvis angio 09/15/2020 FINDINGS: VASCULAR Aorta: Slight interval increase in size of a 7.3 x 7 cm (from 7.2 x 6.6 cm) saccular infrarenal abdominal aorta aneurysm extending approximately 6 cm in the craniocaudal dimension status post stent graft replacement extending from the level of the renal arteries down to the bilateral distal common iliac arteries. A stent is patent. 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 Severe atherosclerotic plaque. No  new aneurysm. No dissection. Celiac: Moderate atherosclerotic plaque. Patent without evidence of aneurysm, dissection, vasculitis or significant stenosis. SMA: Patent without evidence of aneurysm, dissection, vasculitis or significant stenosis. Renals: Patent right renal origin stents. Atherosclerotic plaque at the origin of the left renal artery. Both renal arteries are patent without evidence of aneurysm, dissection, vasculitis, fibromuscular dysplasia or significant stenosis. 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 Inflow: At least moderate atherosclerotic plaque. Patent without evidence of aneurysm, dissection, vasculitis or significant stenosis. Proximal Outflow: Moderate atherosclerotic plaque. Bilateral common femoral and visualized portions of the superficial and profunda femoral arteries are patent without evidence of aneurysm, dissection, vasculitis or significant stenosis. Veins: No obvious venous abnormality within the limitations of this arterial phase study. Review of the MIP images confirms the above findings. NON-VASCULAR Lower chest: Aortic valve leaflet  calcification. At least 3 vessel coronary calcification. Elevated left hemidiaphragm. Hepatobiliary: No focal liver abnormality. Status post cholecystectomy. No biliary dilatation. Pancreas: Diffusely atrophic. No focal lesion. Otherwise normal pancreatic contour. No surrounding inflammatory changes. No main pancreatic ductal dilatation. Spleen: The spleen measures at the upper limits of normal (13 cm). No focal lesion. Adrenals/Urinary Tract: No adrenal nodule bilaterally. Bilateral kidneys are atrophic with renal scarring and cortical thinning. Fluid density lesions within the kidneys likely represent simple renal cysts. There is a indeterminate 1.2 x 0.9 cm left inferior pole renal lesion with a density of 40 Hounsfield units. No hydronephrosis. No hydroureter.  Punctate right nephrolithiasis. The urinary bladder is unremarkable. Stomach/Bowel: Stomach is within normal limits. No evidence of bowel wall thickening or dilatation. Under distended rectum-query bowel thickening versus stool and underdistention. Colonic diverticulosis. Appendix appears normal. Lymphatic: No lymphadenopathy. Reproductive: Prostate is not visualized and possibly surgically removed. Penile prosthesis partially visualized. Reservoir noted along the right lower anterior abdomen/pelvis as well as within the left pelvic intraperitoneal fat. Other: No intraperitoneal free fluid. No intraperitoneal free gas. No organized fluid collection. Musculoskeletal: Bilateral fat containing inguinal hernias. No suspicious lytic or blastic osseous lesions. No acute displaced fracture. Left hip severe degenerative changes. Please see separately dictated CT lumbar spine 10/11/2022. IMPRESSION: VASCULAR 1. 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 2. Aortic Atherosclerosis (ICD10-I70.0) including mitral annular and at least 3 vessel coronary artery calcification. NON-VASCULAR 1. Indeterminate 1.2 x 0.9 cm left inferior pole renal lesion. When the patient is clinically stable and able to follow directions and hold their breath (preferably as an outpatient) further evaluation with dedicated MRI renal protocol should be considered. 2. Nonspecific mild splenomegaly. 3. Punctate nonobstructive right nephrolithiasis. 4. Colonic diverticulosis with no acute diverticulitis. 5. Under distended rectum-query wall thickening versus stool and under distension. Correlate with physical exam. Electronically Signed   By: Tish Frederickson M.D.   On: 10/11/2022 14:28    Procedures Procedures    Medications Ordered in ED Medications  acetaminophen (TYLENOL) tablet 1,000 mg (1,000 mg Oral Given 10/11/22 1220)  ketorolac (TORADOL) 30 MG/ML injection 15 mg (15 mg Intravenous Given 10/11/22 1222)  iohexol (OMNIPAQUE) 350 MG/ML injection 100 mL (100 mLs Intravenous Contrast Given 10/11/22 1319)    ED Course/ Medical Decision Making/ A&P  Medical Decision Making 82 year old male here today for nontraumatic back pain.  Differential diagnoses include musculoskeletal back pain, aortic pathology, considered infectious process, less likely cord compression.  Plan-patient without any neurological deficits on exam.  Exam is most assistant with musculoskeletal back pain as I was able to reproduce his pain with palpation, however patient has an extensive history of large aortic aneurysm requiring repair.  Will obtain imaging.  Will also obtain imaging of the patient's lumbar spine.  Labs ordered on the patient.  No systemic signs of infection but certainly at increased risk due to his dialysis history.  Reassessment-states the pain in his back is improved.  His CT scan of his lumbar spine was normal.  CT angiography of  the abdomen does show mild interval increase in size of aneurysm.  Discussed this with the patient's daughter-in-law who is at bedside.  They will call the vascular surgery office tomorrow for follow-up appointment.  This patient's medical care is complicated by his multiple chronic medical comorbidities.  Will discharge patient home with short course of prednisone.  Amount and/or Complexity of Data Reviewed Labs: ordered. Radiology: ordered.  Risk OTC drugs. Prescription drug management.           Final Clinical Impression(s) / ED Diagnoses Final diagnoses:  Acute midline low back pain, unspecified whether sciatica present    Rx / DC Orders ED Discharge Orders          Ordered    predniSONE (DELTASONE) 10 MG tablet  Daily        10/11/22 1450              Anders Simmonds T, DO 10/11/22 1451

## 2022-10-16 ENCOUNTER — Emergency Department (HOSPITAL_COMMUNITY)
Admission: EM | Admit: 2022-10-16 | Discharge: 2022-10-17 | Discharge status: Against Medical Advice | Payer: Medicare Other | Attending: Emergency Medicine | Admitting: Emergency Medicine

## 2022-10-16 ENCOUNTER — Encounter (HOSPITAL_COMMUNITY): Payer: Self-pay

## 2022-10-16 ENCOUNTER — Other Ambulatory Visit: Payer: Self-pay

## 2022-10-16 DIAGNOSIS — M545 Low back pain, unspecified: Secondary | ICD-10-CM | POA: Insufficient documentation

## 2022-10-16 DIAGNOSIS — Z5321 Procedure and treatment not carried out due to patient leaving prior to being seen by health care provider: Secondary | ICD-10-CM | POA: Insufficient documentation

## 2022-10-16 NOTE — ED Notes (Signed)
Pt called to go back to assigned room x4, no answer

## 2022-10-16 NOTE — ED Triage Notes (Signed)
Pt came in via POV d/t lower back pain that started at lunch time. Rates his pain 8/10 while in triage. Does reports 50 mg Tramadol around 1400 has not alleviated any discomfort. Denies any recent falls/ injuries or heavy lifting.

## 2022-10-31 NOTE — Progress Notes (Signed)
Cardiology Office Note   Date:  11/07/2022   ID:  Ricky Harris, DOB 1940-10-07, MRN [id number]  PCP:  Gordan Payment., MD  Cardiologist:   Darlette Dubow [place], MD   Chief Complaint  Patient presents with   Coronary Artery Disease   Congestive Heart Failure      History of Present Illness: Ricky Harris is a 82 y.o. male who presents for follow up CAD. 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  He presented to the emergency department on 04/25/2021 with acute onset of central chest discomfort.  He reported his pain was an 8 out of 10.  He also describes a tightness in his chest.  He noted radiation to his left arm.  His EKG showed sinus tachycardia with incomplete right bundle branch block.  He was also felt to have 2 mm anterior lateral ST segment elevation with inferior lateral T wave inversion.  A repeat EKG showed sinus rhythm with a rate of 94 and PACs.  He was started on IV heparin.  Cardiology was consulted.  Medical management was recommended.  His IV heparin was continued for 48 hours and he was transitioned to Eliquis and Plavix.  Echocardiogram 04/25/2021 showed an EF of 25-30% with left ventricular global hypokinesis.  He was noted to have G2 DD, severely dilated left atria and mildly dilated right atrium.  Mild mitral valve regurgitation was noted. He was treated with Imdur and metoprolol and higher statin dose.    He previously underwent cardiac catheterization 09/18/2020.  During that time he was noted to have proximal LAD-mid LAD 85% stenosis, LAD 50% stenosis, distal LAD 20% stenosis, proximal circumflex-mid circumflex 65%, proximal RCA 95%, mid RCA 90% and his LV EDP was mildly elevated.  He was noted to have pulmonary hypertension.  He was referred for consideration for CABG.  Due to his significant comorbidities he was not felt to be a good candidate  for CABG or PCI.  In October he underwent a coiling procedure for an endoleak in his EVAR.  He was admitted in late April with metabolic encephalopathy, PNA aspiration and parainfluenza. Was hypotensive. Metoprolol was held. No no antihypertensives. Has been seen in ED since with dizziness and back pain.   He is seen today with his son. He is doing OK. Tolerating dialysis well. Now on midodrine on dialysis days. On no antihypertensives.  Volume status is stable. He denies any chest pain. SOB is unchanged. No edema or increased abdominal girth. No bleeding.  Had recent skin cancer removed left face below ear and ripped stitches out. On antibiotics for this.     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[facility])    Coronary artery disease    on CT scan   Gout    H/O hypercholesterolemia    Heart murmur    History of falling    recent- see ct chest results of 04/25/2018  Hypertension    Hypothyroidism    Liver cirrhosis (HCC)    Myocardial infarction (HCC)    NSTEMI 04/25/21, treated medically   Prediabetes    pt denies    Renal artery stenosis (HCC)    Short-term memory loss    Thrombocytopenia (HCC)    Wears glasses     Past Surgical History:  Procedure Laterality Date   ABDOMINAL AORTIC ENDOVASCULAR STENT GRAFT N/A 01/21/2019   Procedure: ABDOMINAL AORTIC ENDOVASCULAR STENT GRAFT WITH CO2;  Surgeon: Chuck Hint, MD;  Location[facility] OR;  Service: Vascular;  Laterality: N/A;   ABDOMINAL AORTOGRAM W/LOWER EXTREMITY Bilateral 12/14/2018   Procedure: ABDOMINAL AORTOGRAM W/LOWER EXTREMITY;  Surgeon: Chuck Hint, MD;  Location:  [facility] INVASIVE CV LAB;  Service: Cardiovascular;  Laterality: Bilateral;   AV FISTULA PLACEMENT Left 03/12/2019   Procedure: LEFT ARTERIOVENOUS Arteriovenous FISTULA CREATION.;  Surgeon: Chuck Hint, MD;  Location: [facility] OR;  Service: Vascular;  Laterality: Left;   CATARACT EXTRACTION W/ INTRAOCULAR LENS  IMPLANT, BILATERAL     CHOLECYSTECTOMY     ESOPHAGEAL BANDING N/A 08/11/2017   Procedure: ESOPHAGEAL BANDING;  Surgeon: Kathi Der, MD;  Location: [facility] ENDOSCOPY;  Service: Gastroenterology;  Laterality: N/A;   ESOPHAGEAL BANDING N/A 10/26/2017   Procedure: ESOPHAGEAL BANDING;  Surgeon: Kathi Der, MD;  Location: [facility] ENDOSCOPY;  Service: Gastroenterology;  Laterality: N/A;   ESOPHAGEAL BANDING N/A 12/19/2017   Procedure: ESOPHAGEAL BANDING;  Surgeon: Kathi Der, MD;  Location: [facility]OSCOPY;  Service: Gastroenterology;  Laterality: N/A;   ESOPHAGOGASTRODUODENOSCOPY (EGD) WITH PROPOFOL N/A 07/05/2017   Procedure: ESOPHAGOGASTRODUODENOSCOPY (EGD) WITH PROPOFOL;  Surgeon: Kathi Der, MD;  Location: [facility]OSCOPY;  Service: Gastroenterology;  Laterality: N/A;   ESOPHAGOGASTRODUODENOSCOPY (EGD) WITH PROPOFOL N/A 08/11/2017   Procedure: ESOPHAGOGASTRODUODENOSCOPY (EGD) WITH PROPOFOL;  Surgeon: Kathi Der, MD;  Location: [facility];  Service: Gastroenterology;  Laterality: N/A;   ESOPHAGOGASTRODUODENOSCOPY (EGD) WITH PROPOFOL N/A 10/26/2017   Procedure: ESOPHAGOGASTRODUODENOSCOPY (EGD) WITH PROPOFOL;  Surgeon: Kathi Der, MD;  Location: [facility];  Service: Gastroenterology;  Laterality: N/A;   ESOPHAGOGASTRODUODENOSCOPY (EGD) WITH PROPOFOL N/A 12/19/2017   Procedure: ESOPHAGOGASTRODUODENOSCOPY (EGD) WITH PROPOFOL;  Surgeon: Kathi Der, MD;  Location: [facility];  Service: Gastroenterology;  Laterality: N/A;   ESOPHAGOGASTRODUODENOSCOPY (EGD) WITH PROPOFOL N/A 10/29/2018   Procedure: ESOPHAGOGASTRODUODENOSCOPY (EGD) WITH PROPOFOL;  Surgeon: Kathi Der, MD;  Location: [facility];  Service: Gastroenterology;  Laterality: N/A;   ESOPHAGOGASTRODUODENOSCOPY (EGD) WITH PROPOFOL N/A 02/14/2020   Procedure: ESOPHAGOGASTRODUODENOSCOPY (EGD) WITH PROPOFOL;  Surgeon: Kathi Der, MD;  Location: [facility];  Service: Gastroenterology;  Laterality: N/A;   EYE SURGERY     bilateral cataract removal with lens placement   HERNIA REPAIR     IR ANGIOGRAM PULMONARY BILATERAL SELECTIVE  12/03/2021   IR ANGIOGRAM SELECTIVE EACH ADDITIONAL VESSEL  12/03/2021   IR ANGIOGRAM VISCERAL SELECTIVE  12/03/2021   IR ANGIOGRAM VISCERAL SELECTIVE  12/03/2021   IR ANGIOGRAM VISCERAL SELECTIVE  12/03/2021   IR CT SPINE LTD  12/03/2021   IR PARACENTESIS  12/26/2018   IR PARACENTESIS  01/02/2019   IR PARACENTESIS  01/15/2019   IR PARACENTESIS  01/30/2019   IR PARACENTESIS  02/05/2019   IR PARACENTESIS  02/14/2019   IR PARACENTESIS  02/28/2019   IR PARACENTESIS  03/14/2019   IR PARACENTESIS  03/29/2019   IR PARACENTESIS  04/11/2019   IR PARACENTESIS  04/25/2019   IR PARACENTESIS  05/09/2019   IR PARACENTESIS  06/03/2019   IR PARACENTESIS  06/06/2019   IR  PARACENTESIS  06/20/2019   IR PARACENTESIS  07/03/2019   IR PARACENTESIS  07/19/2019   IR PARACENTESIS  08/08/2019   IR PARACENTESIS  08/27/2019   IR PARACENTESIS  09/09/2019   IR PARACENTESIS  09/24/2019   IR PARACENTESIS  10/07/2019   IR PARACENTESIS  10/23/2019   IR PARACENTESIS  11/07/2019   IR PARACENTESIS  11/21/2019   IR PARACENTESIS  12/04/2019   IR PARACENTESIS  12/25/2019   IR PARACENTESIS  01/10/2020   IR PARACENTESIS  01/27/2020   IR PARACENTESIS  02/19/2020   IR PARACENTESIS  03/27/2020   IR RADIOLOGIST EVAL & MGMT  11/16/2021   IR RADIOLOGIST EVAL & MGMT  08/24/2022   IR TRANSCATH/EMBOLIZ  12/03/2021   IR US GUIDE VASC ACCESS RIGHT  12/03/2021   PROSTATECTOMY     RADIOLOGY WITH ANESTHESIA N/A 12/03/2021   Procedure: Type II Endoleak;  Surgeon: Gilmer Mor, DO;  Location: [facility] OR;  Service: Anesthesiology;   Laterality: N/A;   RIGHT AND LEFT HEART CATH N/A 09/18/2020   Procedure: RIGHT AND LEFT HEART CATH;  Surgeon: Lennette Bihari, MD;  Location: [facility] INVASIVE CV LAB;  Service: Cardiovascular;  Laterality: N/A;   subtotal gastrectomy       Current Outpatient Medications  Medication Sig Dispense Refill   apixaban (ELIQUIS) 2.5 MG TABS tablet Take 1 tablet (2.5 mg total) by mouth 2 (two) times daily. 180 tablet 1   atorvastatin (LIPITOR) 40 MG tablet Take 1 tablet (40 mg total) by mouth daily. (Patient taking differently: Take 40 mg by mouth at bedtime.) 30 tablet 1   donepezil (ARICEPT) 5 MG tablet Take 5 mg by mouth at bedtime.     doxycycline (VIBRAMYCIN) 100 MG capsule Take 100 mg by mouth 2 (two) times daily.     levothyroxine (SYNTHROID) 50 MCG tablet Take 1 tablet (50 mcg total) by mouth daily. 30 tablet 1   multivitamin (RENA-VIT) TABS tablet Take 1 tablet by mouth daily. (Patient taking differently: Take 1 tablet by mouth at bedtime.) 30 tablet 0   nitroGLYCERIN (NITROSTAT) 0.4 MG SL tablet Place 0.4 mg under the tongue every 5 (five) minutes as needed for chest pain.     traMADol (ULTRAM) 50 MG tablet Take 1 tablet (50 mg total) by mouth every 8 (eight) hours as needed for moderate pain. 8 tablet 0   No current facility-administered medications for this visit.    Allergies:   Cephalexin and Hydralazine    Social History:  The patient  reports that he has never smoked. He has never used smokeless tobacco. He reports that he does not currently use alcohol. He reports that he does not use drugs.   Family History:  The patient's family history includes Diabetes Mellitus II in his mother; Stroke in his mother.    ROS:  Please see the history of present illness.   Otherwise, review of systems are positive for none.   All other systems are reviewed and negative.    PHYSICAL EXAM: VS:  BP 112/61 (BP Location: Right Arm, Patient Position: Sitting, Cuff Size: Normal)   Pulse 75   Ht 6'  (1.829 m)   Wt 181 lb 9.6 oz (82.4 kg)   SpO2 97%   BMI 24.63 kg/m  , BMI Body mass index is 24.63 kg/m. GEN: elderly, chronically ill appearing, in no acute distress HEENT: normal Neck: no JVD, carotid bruits, or masses. Open wound below left ear Cardiac: RRR; soft SEM RUSB. no rubs, or gallops,no edema  Respiratory:  clear to auscultation bilaterally, normal work of breathing GI: soft, nontender, nondistended, + BS MS: no deformity or atrophy Skin: warm and dry, no rash Neuro:  Strength and sensation are intact Psych: euthymic mood, full affect   EKG:  EKG is not ordered today.     Recent Labs: 07/06/2022: B Natriuretic Peptide 234.0; Magnesium 2.1 07/30/2022: ALT 38 10/11/2022: BUN 67; Creatinine, Ser 10.02; Hemoglobin 10.9; Platelets 117; Potassium 5.3; Sodium 134    Lipid Panel    Component Value Date/Time   CHOL 133 09/19/2020 0358   TRIG 103 09/19/2020 0358   HDL 38 (L) 09/19/2020 0358   CHOLHDL 3.5 09/19/2020 0358   VLDL 21 09/19/2020 0358   LDLCALC 74 09/19/2020 0358   Dated 02/04/22: Hgb 11.2, plts 146K. Creatinine 5.45. alk phos 230, otherwise CMET normal. Cholesterol 118, triglycerides 118, HDL 42, LDL 65. TSH normal.  Wt Readings from Last 3 Encounters:  11/07/22 181 lb 9.6 oz (82.4 kg)  10/16/22 130 lb (59 kg)  10/11/22 186 lb (84.4 kg)      Other studies Reviewed: Additional studies/ records that were reviewed today include:   Echocardiogram 04/25/2021 IMPRESSIONS     1. Left ventricular ejection fraction, by estimation, is 25 to 30%. The  left ventricle has severely decreased function. The left ventricle  demonstrates global hypokinesis. There is mild left ventricular  hypertrophy of the basal-septal segment. Left  ventricular diastolic parameters are consistent with Grade II diastolic  dysfunction (pseudonormalization).   2. Right ventricular systolic function is normal. The right ventricular  size is normal. Tricuspid regurgitation signal is  inadequate for assessing  PA pressure.   3. Left atrial size was severely dilated.   4. Right atrial size was mildly dilated.   5. The mitral valve is normal in structure. Mild mitral valve  regurgitation. No evidence of mitral stenosis.   6. The aortic valve is calcified. There is severe calcifcation of the  aortic valve. There is severe thickening of the aortic valve. Aortic valve  regurgitation is not visualized. Aortic valve sclerosis/calcification is  present, without any evidence of  aortic stenosis. Aortic valve area, by VTI measures 1.71 cm. Aortic valve  mean gradient measures 11.0 mmHg. Aortic valve Vmax measures 2.25 m/s.   7. Aortic dilatation noted. There is mild dilatation of the ascending  aorta, measuring 40 mm.   8. The inferior vena cava is normal in size with <50% respiratory  variability, suggesting right atrial pressure of 8 mmHg.   Cardiac catheterization 09/18/2020   Prox LAD to Mid LAD lesion is 85% stenosed.   Mid LAD lesion is 50% stenosed.   Dist LAD lesion is 20% stenosed.   Prox Cx to Mid Cx lesion is 65% stenosed.   Prox RCA lesion is 95% stenosed.   Mid RCA lesion is 90% stenosed.   LV end diastolic pressure is mildly elevated.   Hemodynamic findings consistent with mild pulmonary hypertension.   There is severe coronary calcification involving the LAD and right coronary artery with mild calcification of the left circumflex vessel.   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   Mild right heart pressure elevation with mild pulmonary hypertension.  RECOMMENDATION: The patient has severe coronary  calcification and three-vessel disease.  Echo Doppler has demonstrated reduced EF in the 25 to 30% range.  Recommend initial surgical consultation for consideration of possible CABG revascularization surgery.  However with the patient's significant comorbidities, if surgery is excluded, will need to review with colleagues regarding potential orbital atherectomy and intervention into the LAD and RCA.  We will consider reinstitution of low-dose heparin much later today and this patient with recent PAF and ulcerated plaque in the proximal RCA.   Diagnostic Dominance: Co-dominant Intervention   ASSESSMENT AND PLAN:  1.  CAD - known severe 3 vessel disease by cath in July 2022. Poor candidate for CABG or PCI. S/p NSTEMI in February 2023 managed medically. Fortunately he is not having any significant angina.   On eliquis No longer on nitrates or beta blocker due to hypotension.     2. Hyperlipidemia- Continue atorvastatin. LDL 65   3. Chronic systolic and diastolic CHF-no increased DOE or activity intolerance.  Weight stable, euvolemic.  Echocardiogram 04/25/2021 showed an LVEF of 25-30% with G2 DD.  unable to take GDMT due to ESRD and hypotension. Fluid volume managed by HD   4. Paroxysmal atrial fibrillation-no recurrent symptoms Continue apixaban   5. Liver cirrhosis-euvolemic.  Has known gastric varices.  Follows with PCP No bleeding   6. End-stage renal disease-compliant with HD. Follows with nephrology.      Current medicines are reviewed at length with the patient today.  The patient does not have concerns regarding medicines.  The following changes have been made:  see above.  Labs/ tests ordered today include:  No orders of the defined types were placed in this encounter.       Disposition:   FU with APP in 6 months  Signed, Hasel Janish [place], MD  11/07/2022 9:41 AM    [facility] [address], [place], [place], [address] Phone [phone], Fax  [phone]

## 2022-11-07 ENCOUNTER — Encounter: Payer: Self-pay | Admitting: Cardiology

## 2022-11-07 ENCOUNTER — Ambulatory Visit: Payer: Medicare Other | Attending: Cardiology | Admitting: Cardiology

## 2022-11-07 VITALS — BP 112/61 | HR 75 | Ht 72.0 in | Wt 181.6 lb

## 2022-11-07 DIAGNOSIS — I25118 Atherosclerotic heart disease of native coronary artery with other forms of angina pectoris: Secondary | ICD-10-CM

## 2022-11-07 DIAGNOSIS — N186 End stage renal disease: Secondary | ICD-10-CM

## 2022-11-07 DIAGNOSIS — E782 Mixed hyperlipidemia: Secondary | ICD-10-CM

## 2022-11-07 DIAGNOSIS — I480 Paroxysmal atrial fibrillation: Secondary | ICD-10-CM

## 2022-11-07 DIAGNOSIS — I5042 Chronic combined systolic (congestive) and diastolic (congestive) heart failure: Secondary | ICD-10-CM | POA: Diagnosis not present

## 2022-11-07 DIAGNOSIS — Z992 Dependence on renal dialysis: Secondary | ICD-10-CM

## 2022-11-07 MED ORDER — MIDODRINE HCL 10 MG PO TABS: 10.0000 mg | ORAL_TABLET | Freq: Every day | ORAL | Status: DC | PRN

## 2022-11-07 NOTE — Patient Instructions (Signed)
Medication Instructions:  Continue same medications *If you need a refill on your cardiac medications before your next appointment, please call your pharmacy*   Lab Work: None ordered   Testing/Procedures: None ordered   Follow-Up: At [facility], you and your health needs are our priority.  As part of our continuing mission to provide you with exceptional heart care, we have created designated Provider Care Teams.  These Care Teams include your primary Cardiologist (physician) and Advanced Practice Providers (APPs -  Physician Assistants and Nurse Practitioners) who all work together to provide you with the care you need, when you need it.  We recommend signing up for the patient portal called "MyChart".  Sign up information is provided on this After Visit Summary.  MyChart is used to connect with patients for Virtual Visits (Telemedicine).  Patients are able to view lab/test results, encounter notes, upcoming appointments, etc.  Non-urgent messages can be sent to your provider as well.   To learn more about what you can do with MyChart, go to [email or web address].    Your next appointment:  6 months    Call in Jan to schedule March appointment     Provider:  Dr.Jordan's PA

## 2022-11-16 ENCOUNTER — Emergency Department (HOSPITAL_COMMUNITY): Payer: Medicare Other

## 2022-11-16 ENCOUNTER — Other Ambulatory Visit: Payer: Self-pay

## 2022-11-16 ENCOUNTER — Encounter (HOSPITAL_COMMUNITY): Payer: Self-pay

## 2022-11-16 ENCOUNTER — Emergency Department (HOSPITAL_COMMUNITY)
Admission: EM | Admit: 2022-11-16 | Discharge: 2022-11-17 | Disposition: A | Discharge status: Home / Self Care | Payer: Medicare Other | Attending: Emergency Medicine | Admitting: Emergency Medicine

## 2022-11-16 DIAGNOSIS — Z992 Dependence on renal dialysis: Secondary | ICD-10-CM | POA: Diagnosis not present

## 2022-11-16 DIAGNOSIS — N289 Disorder of kidney and ureter, unspecified: Secondary | ICD-10-CM | POA: Insufficient documentation

## 2022-11-16 DIAGNOSIS — N186 End stage renal disease: Secondary | ICD-10-CM | POA: Insufficient documentation

## 2022-11-16 DIAGNOSIS — R1032 Left lower quadrant pain: Secondary | ICD-10-CM | POA: Diagnosis present

## 2022-11-16 DIAGNOSIS — Z7901 Long term (current) use of anticoagulants: Secondary | ICD-10-CM | POA: Insufficient documentation

## 2022-11-16 DIAGNOSIS — D649 Anemia, unspecified: Secondary | ICD-10-CM | POA: Diagnosis not present

## 2022-11-16 LAB — CBC WITH DIFFERENTIAL/PLATELET
Abs Immature Granulocytes: 0.04 10*3/uL (ref 0.00–0.07)
Basophils Absolute: 0.1 10*3/uL (ref 0.0–0.1)
Basophils Relative: 1 %
Eosinophils Absolute: 0.1 10*3/uL (ref 0.0–0.5)
Eosinophils Relative: 1 %
HCT: 34.5 % — ABNORMAL LOW (ref 39.0–52.0)
Hemoglobin: 11 g/dL — ABNORMAL LOW (ref 13.0–17.0)
Immature Granulocytes: 1 %
Lymphocytes Relative: 15 %
Lymphs Abs: 1.1 10*3/uL (ref 0.7–4.0)
MCH: 33.2 pg (ref 26.0–34.0)
MCHC: 31.9 g/dL (ref 30.0–36.0)
MCV: 104.2 fL — ABNORMAL HIGH (ref 80.0–100.0)
Monocytes Absolute: 0.8 10*3/uL (ref 0.1–1.0)
Monocytes Relative: 10 %
Neutro Abs: 5.6 10*3/uL (ref 1.7–7.7)
Neutrophils Relative %: 72 %
Platelets: 137 10*3/uL — ABNORMAL LOW (ref 150–400)
RBC: 3.31 MIL/uL — ABNORMAL LOW (ref 4.22–5.81)
RDW: 16.5 % — ABNORMAL HIGH (ref 11.5–15.5)
WBC: 7.7 10*3/uL (ref 4.0–10.5)
nRBC: 0 % (ref 0.0–0.2)

## 2022-11-16 LAB — COMPREHENSIVE METABOLIC PANEL WITH GFR
ALT: 38 U/L (ref 0–44)
AST: 42 U/L — ABNORMAL HIGH (ref 15–41)
Albumin: 3.3 g/dL — ABNORMAL LOW (ref 3.5–5.0)
Alkaline Phosphatase: 266 U/L — ABNORMAL HIGH (ref 38–126)
Anion gap: 15 (ref 5–15)
BUN: 47 mg/dL — ABNORMAL HIGH (ref 8–23)
CO2: 26 mmol/L (ref 22–32)
Calcium: 8.4 mg/dL — ABNORMAL LOW (ref 8.9–10.3)
Chloride: 94 mmol/L — ABNORMAL LOW (ref 98–111)
Creatinine, Ser: 6.7 mg/dL — ABNORMAL HIGH (ref 0.61–1.24)
GFR, Estimated: 8 mL/min — ABNORMAL LOW (ref >60)
Glucose, Bld: 113 mg/dL — ABNORMAL HIGH (ref 70–99)
Potassium: 4.7 mmol/L (ref 3.5–5.1)
Sodium: 135 mmol/L (ref 135–145)
Total Bilirubin: 0.9 mg/dL (ref 0.3–1.2)
Total Protein: 7.1 g/dL (ref 6.5–8.1)

## 2022-11-16 LAB — LIPASE, BLOOD: Lipase: 36 U/L (ref 11–51)

## 2022-11-16 NOTE — ED Provider Triage Note (Signed)
Emergency Medicine Provider Triage Evaluation Note  Ricky Harris , a 82 y.o. male  was evaluated in triage.  Pt complains of pain in his left lower quadrant that began this morning.  No associated nausea or vomiting, diarrhea, fever or chills.  No dizziness.  States concern about an aneurysm that was noted at his last visit.  Review of Systems  Positive: As above Negative: As above  Physical Exam  BP 113/71 (BP Location: Right Arm)   Pulse 88   Temp 98.7 F (37.1 C) (Oral)   Resp 16   Ht 6' (1.829 m)   Wt 84.4 kg   SpO2 98%   BMI 25.23 kg/m  Gen:   Awake, no distress, stable vitals Resp:  Normal effort  MSK:   Moves extremities without difficulty  Other:  Left lower quadrant tenderness to palpation, no pulsatile abdominal mass noted  Medical Decision Making  Medically screening exam initiated at 4:54 PM.  Appropriate orders placed.  Annetta Maw was informed that the remainder of the evaluation will be completed by another provider, this initial triage assessment does not replace that evaluation, and the importance of remaining in the ED until their evaluation is complete.     Arabella Merles, PA-C 11/16/22 1655

## 2022-11-16 NOTE — ED Triage Notes (Signed)
Left sided abdominal pain that started this AM. Pt has abdominal aneurysm and provider sent pt here to evaluate aneurysm. Denies any other dx, denies dizziness.

## 2022-11-16 NOTE — ED Triage Notes (Signed)
Pt continues to wait on room assignment. Called RN to lobby d/t c/o dizziness and overall feeling worse.

## 2022-11-17 ENCOUNTER — Emergency Department (HOSPITAL_COMMUNITY): Payer: Medicare Other

## 2022-11-17 ENCOUNTER — Encounter (HOSPITAL_COMMUNITY): Payer: Self-pay

## 2022-11-17 MED ORDER — LIDOCAINE 5 % EX PTCH: 1.0000 | MEDICATED_PATCH | CUTANEOUS | 0 refills | Status: DC

## 2022-11-17 MED ORDER — SODIUM CHLORIDE (PF) 0.9 % IJ SOLN
INTRAMUSCULAR | Status: AC
  Filled 2022-11-17: qty 50

## 2022-11-17 MED ORDER — ACETAMINOPHEN 500 MG PO TABS
1000.0000 mg | ORAL_TABLET | Freq: Once | ORAL | Status: AC
  Administered 2022-11-17: 1000 mg via ORAL
  Filled 2022-11-17: qty 2

## 2022-11-17 MED ORDER — IOHEXOL 350 MG/ML SOLN
80.0000 mL | Freq: Once | INTRAVENOUS | Status: AC | PRN
  Administered 2022-11-17: 100 mL via INTRAVENOUS

## 2022-11-17 MED ORDER — LIDOCAINE 5 % EX PTCH
1.0000 | MEDICATED_PATCH | CUTANEOUS | Status: DC
  Administered 2022-11-17: 1 via TRANSDERMAL
  Filled 2022-11-17: qty 1

## 2022-11-17 NOTE — ED Provider Notes (Signed)
[place] EMERGENCY DEPARTMENT AT [facility] Provider Note   CSN: [id number] Arrival date & time: 11/16/22  1626     History  Chief Complaint  Patient presents with   Abdominal Pain   Dizziness    Ricky Harris is a 82 y.o. male.  The history is provided by the patient.  Abdominal Pain Pain location:  LLQ Pain quality: aching   Pain radiates to:  Does not radiate Pain severity:  Moderate Onset quality:  Sudden Timing:  Constant Progression:  Unchanged Chronicity:  New Context: not sick contacts, not suspicious food intake and not trauma   Relieved by:  Nothing Worsened by:  Nothing Ineffective treatments:  None tried Associated symptoms: no anorexia, no chills, no constipation, no diarrhea, no fever, no flatus, no shortness of breath, no vaginal discharge and no vomiting   Risk factors: being elderly   Patient with ESRD TTS and endovascular graft is concerned about leak with 1 day of LLQ pain. No n/v/d.        Home Medications Prior to Admission medications   Medication Sig Start Date End Date Taking? Authorizing Provider  apixaban (ELIQUIS) 2.5 MG TABS tablet Take 1 tablet (2.5 mg total) by mouth 2 (two) times daily. 05/23/22   [place], Peter M, MD  atorvastatin (LIPITOR) 40 MG tablet Take 1 tablet (40 mg total) by mouth daily. Patient taking differently: Take 40 mg by mouth at bedtime. 04/28/21   Noralee Stain, DO  donepezil (ARICEPT) 5 MG tablet Take 5 mg by mouth at bedtime. 03/01/22   [provider]  doxycycline (VIBRAMYCIN) 100 MG capsule Take 100 mg by mouth 2 (two) times daily. 11/05/22   [provider]  levothyroxine (SYNTHROID) 50 MCG tablet Take 1 tablet (50 mcg total) by mouth daily. 04/27/21   Noralee Stain, DO  midodrine (PROAMATINE) 10 MG tablet Take 1 tablet (10 mg total) by mouth daily as needed (takes 3 days a week on dialysis days). 11/07/22   [place], Peter M, MD  multivitamin (RENA-VIT) TABS tablet Take 1 tablet by mouth  daily. Patient taking differently: Take 1 tablet by mouth at bedtime. 09/27/19   Autry-Lott, Randa Evens, DO  nitroGLYCERIN (NITROSTAT) 0.4 MG SL tablet Place 0.4 mg under the tongue every 5 (five) minutes as needed for chest pain.    [provider]  traMADol (ULTRAM) 50 MG tablet Take 1 tablet (50 mg total) by mouth every 8 (eight) hours as needed for moderate pain. 03/12/19   Rhyne, Ames Coupe, PA-C      Allergies    Cephalexin and Hydralazine    Review of Systems   Review of Systems  Constitutional:  Negative for chills and fever.  HENT:  Negative for facial swelling.   Eyes:  Negative for redness.  Respiratory:  Negative for shortness of breath, wheezing and stridor.   Gastrointestinal:  Positive for abdominal pain. Negative for anorexia, constipation, diarrhea, flatus and vomiting.  Genitourinary:  Negative for flank pain and vaginal discharge.    Physical Exam Updated Vital Signs BP 109/62   Pulse 63   Temp 98.5 F (36.9 C) (Oral)   Resp 16   Ht 6' (1.829 m)   Wt 84.4 kg   SpO2 99%   BMI 25.23 kg/m  Physical Exam Vitals and nursing note reviewed.  Constitutional:      General: He is not in acute distress.    Appearance: He is well-developed. He is not diaphoretic.  HENT:     Head:  Normocephalic and atraumatic.     Nose: Nose normal.  Eyes:     Extraocular Movements: Extraocular movements intact.     Conjunctiva/sclera: Conjunctivae normal.     Pupils: Pupils are equal, round, and reactive to light.  Cardiovascular:     Rate and Rhythm: Normal rate and regular rhythm.     Pulses: Normal pulses.     Heart sounds: Normal heart sounds.  Pulmonary:     Effort: Pulmonary effort is normal.     Breath sounds: Normal breath sounds. No wheezing or rales.  Abdominal:     General: Bowel sounds are normal.     Palpations: Abdomen is soft.     Tenderness: There is no abdominal tenderness. There is no guarding or rebound.  Musculoskeletal:        General: Normal range  of motion.     Cervical back: Normal range of motion and neck supple.  Skin:    General: Skin is warm and dry.     Capillary Refill: Capillary refill takes less than 2 seconds.  Neurological:     General: No focal deficit present.     Mental Status: He is alert and oriented to person, place, and time.     Deep Tendon Reflexes: Reflexes normal.  Psychiatric:        Mood and Affect: Mood normal.        Behavior: Behavior normal.     ED Results / Procedures / Treatments   Labs (all labs ordered are listed, but only abnormal results are displayed) Results for orders placed or performed during the hospital encounter of 11/16/22  CBC with Differential  Result Value Ref Range   WBC 7.7 4.0 - 10.5 K/uL   RBC 3.31 (L) 4.22 - 5.81 MIL/uL   Hemoglobin 11.0 (L) 13.0 - 17.0 g/dL   HCT 40.9 (L) 81.1 - 91.4 %   MCV 104.2 (H) 80.0 - 100.0 fL   MCH 33.2 26.0 - 34.0 pg   MCHC 31.9 30.0 - 36.0 g/dL   RDW 78.2 (H) 95.6 - 21.3 %   Platelets 137 (L) 150 - 400 K/uL   nRBC 0.0 0.0 - 0.2 %   Neutrophils Relative % 72 %   Neutro Abs 5.6 1.7 - 7.7 K/uL   Lymphocytes Relative 15 %   Lymphs Abs 1.1 0.7 - 4.0 K/uL   Monocytes Relative 10 %   Monocytes Absolute 0.8 0.1 - 1.0 K/uL   Eosinophils Relative 1 %   Eosinophils Absolute 0.1 0.0 - 0.5 K/uL   Basophils Relative 1 %   Basophils Absolute 0.1 0.0 - 0.1 K/uL   Immature Granulocytes 1 %   Abs Immature Granulocytes 0.04 0.00 - 0.07 K/uL  Comprehensive metabolic panel  Result Value Ref Range   Sodium 135 135 - 145 mmol/L   Potassium 4.7 3.5 - 5.1 mmol/L   Chloride 94 (L) 98 - 111 mmol/L   CO2 26 22 - 32 mmol/L   Glucose, Bld 113 (H) 70 - 99 mg/dL   BUN 47 (H) 8 - 23 mg/dL   Creatinine, Ser 0.86 (H) 0.61 - 1.24 mg/dL   Calcium 8.4 (L) 8.9 - 10.3 mg/dL   Total Protein 7.1 6.5 - 8.1 g/dL   Albumin 3.3 (L) 3.5 - 5.0 g/dL   AST 42 (H) 15 - 41 U/L   ALT 38 0 - 44 U/L   Alkaline Phosphatase 266 (H) 38 - 126 U/L   Total Bilirubin 0.9 0.3 - 1.2  mg/dL  GFR, Estimated 8 (L) >60 mL/min   Anion gap 15 5 - 15  Lipase, blood  Result Value Ref Range   Lipase 36 11 - 51 U/L   CT Angio Chest/Abd/Pel for Dissection W and/or Wo Contrast  Result Date: 11/17/2022 CLINICAL DATA:  82 year old male with history of left-sided abdominal pain and dizziness. Known history of abdominal aortic aneurysm. Suspected acute aortic syndrome. EXAM: CT ANGIOGRAPHY CHEST, ABDOMEN AND PELVIS TECHNIQUE: Non-contrast CT of the chest was initially obtained. Multidetector CT imaging through the chest, abdomen and pelvis was performed using the standard protocol during bolus administration of intravenous contrast. Multiplanar reconstructed images and MIPs were obtained and reviewed to evaluate the vascular anatomy. RADIATION DOSE REDUCTION: This exam was performed according to the departmental dose-optimization program which includes automated exposure control, adjustment of the mA and/or kV according to patient size and/or use of iterative reconstruction technique. CONTRAST:  OMNIPAQUE IOHEXOL 350 MG/ML SOLN COMPARISON:  CT the abdomen and pelvis without contrast 11/16/2022. CTA of the abdomen and pelvis 10/11/2022. CTA of the chest, abdomen and pelvis 07/06/2022. FINDINGS: CTA CHEST FINDINGS Cardiovascular: Precontrast images demonstrate no crescentic high attenuation associated with the wall of the thoracic aorta to suggest the presence of acute intramural hemorrhage. Postcontrast images demonstrate no evidence of thoracic aortic aneurysm or dissection. Ascending thoracic aorta is ectatic measuring up to 4.1 cm in diameter. Mid arch and descending thoracic aorta measure 3.3 cm and 2.8 cm in diameter respectively. There is aortic atherosclerosis, as well as atherosclerosis of the great vessels of the mediastinum and the coronary arteries, including calcified atherosclerotic plaque in the left main, left anterior descending, left circumflex and right coronary arteries.  Aberrant right subclavian artery (normal anatomical variant) incidentally noted. Thickening and calcification of the aortic valve. Heart size is normal. There is no significant pericardial fluid, thickening or pericardial calcification. Mediastinum/Nodes: No pathologically enlarged mediastinal or hilar lymph nodes. Esophagus is unremarkable in appearance. No axillary lymphadenopathy. Lungs/Pleura: No acute consolidative airspace disease. No pleural effusions. No pneumothorax. Scattered areas of scarring in the lung bases bilaterally. No definite suspicious appearing pulmonary nodules or masses are noted. Musculoskeletal: There are no aggressive appearing lytic or blastic lesions noted in the visualized portions of the skeleton. Review of the MIP images confirms the above findings. CTA ABDOMEN AND PELVIS FINDINGS VASCULAR Aorta: Extensive aortic atherosclerosis with large fusiform infrarenal abdominal aortic aneurysm again noted, measuring up to 7.6 x 6.8 cm. Patient is status post aorto bi-iliac stent graft placement. The graft is widely patent. 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 No definite findings of into the confidently identified on today's study. Celiac: Patent without evidence of aneurysm, dissection, vasculitis or significant stenosis. SMA: Patent without evidence of aneurysm, dissection, vasculitis or significant stenosis. Renals: Atherosclerosis in the renal arteries bilaterally with probable high-grade stenosis at the ostium of the left renal artery in moderate stenosis at the ostium of the right renal artery. 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 Inflow: Patent without evidence of aneurysm, dissection, vasculitis or significant stenosis. Veins: No obvious  venous abnormality within the limitations of this arterial phase study. Review of the MIP images confirms the above findings. NON-VASCULAR Hepatobiliary: Liver has a shrunken appearance and nodular contour indicative of underlying cirrhosis. No suspicious cystic or solid hepatic lesions are confidently identified on  today's arterial phase examination. No intra or extrahepatic biliary ductal dilatation. Status post cholecystectomy. Portal vein is dilated measuring 1.8 cm. Pancreas: No pancreatic mass. No pancreatic ductal dilatation. No pancreatic or peripancreatic fluid collections or inflammatory changes. Spleen: Spleen is enlarged measuring 14.0 x 7.3 x 11.8 cm (estimated splenic volume of 603 mL). Adrenals/Urinary Tract: Severe atrophy of the kidneys bilaterally. Multiple small renal lesions are noted ranging from low to intermediate to high attenuation, incompletely characterized on today's arterial phase examination, but most notable for an exophytic 1.3 cm lesion in the lower pole of the left kidney which is 60 HU (similar in size to prior examination from 2023, but higher in attenuation than the comparison study). No hydroureteronephrosis. Urinary bladder is completely decompressed. Bilateral adrenal glands are normal in appearance. Stomach/Bowel: The appearance of the stomach is normal. There is no pathologic dilatation of small bowel or colon. Numerous colonic diverticula are noted, particularly in the sigmoid colon, without surrounding inflammatory changes to indicate an acute diverticulitis at this time. Normal appendix. Lymphatic: No lymphadenopathy noted in the abdomen or pelvis. Reproductive: Prostate gland is either diminutive or surgically absent. Penile prosthesis partially imaged with bilaterally reservoirs, one in the right inguinal region and the other in the low left hemipelvis. Other: No significant volume of ascites.  No pneumoperitoneum. Musculoskeletal: There are no aggressive appearing  lytic or blastic lesions noted in the visualized portions of the skeleton. Review of the MIP images confirms the above findings. IMPRESSION: 1. No definite evidence of acute aortic syndrome in the chest, abdomen or pelvis. 2. Large chronic infrarenal abdominal aortic aneurysm redemonstrated, currently measuring 7.6 x 6.8 cm (previously 7.3 x 7.0 cm on 10/11/2022). No signs of active extravasation or endoleak on today's study. 3. 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 5. Indeterminate lesion in the lower pole of the left kidney redemonstrated. Given the chronicity of this finding, this is favored to represent a small proteinaceous/hemorrhagic cysts and would require follow-up nonemergent outpatient MRI of the abdomen with and without IV gadolinium to provide definitive characterization if of clinical concern. 6. Colonic diverticulosis without evidence of acute diverticulitis at this time. 7. 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 No suspicious hepatic lesions are noted at this time. 8. 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 9. Additional incidental findings, as above. Electronically Signed   By: Trudie Reed M.D.   On: 11/17/2022 05:37   CT ABDOMEN PELVIS WO CONTRAST  Result Date: 11/16/2022 CLINICAL DATA:  Left lower quadrant abdominal pain, known abdominal aortic aneurysm EXAM: CT ABDOMEN AND PELVIS WITHOUT CONTRAST TECHNIQUE: Multidetector CT imaging of the abdomen and pelvis was performed following the standard protocol without IV contrast. Unenhanced CT was performed per clinician order. Lack of IV contrast limits sensitivity and  specificity, especially for evaluation of abdominal/pelvic solid viscera and vascular structures. RADIATION DOSE REDUCTION: This exam was performed according to the departmental dose-optimization program which includes automated exposure control, adjustment of the mA and/or kV according to patient size and/or use of iterative reconstruction technique. COMPARISON:  10/11/2022 FINDINGS: Lower chest: No acute pleural or parenchymal lung disease. Chronic elevation left hemidiaphragm. Hepatobiliary: Cholecystectomy. Unremarkable unenhanced appearance of the liver. Pancreas: Unremarkable unenhanced  appearance. Spleen: Unremarkable unenhanced appearance. Adrenals/Urinary Tract: Bilateral renal atrophy consistent with end-stage renal disease. Indeterminate exophytic lesion lower pole left kidney measuring 1.2 cm, attenuation of 51 HU. Other simple appearing renal cysts are unchanged and do not require follow-up. The adrenals are unremarkable. The bladder is decompressed, limiting its evaluation. Stomach/Bowel: No bowel obstruction or ileus. Normal appendix right lower quadrant. Diffuse colonic diverticulosis without evidence of acute diverticulitis. No bowel wall thickening or inflammatory change. Vascular/Lymphatic: Known infrarenal abdominal aortic aneurysm status post prior endoluminal stent graft repair. Outer diameter of the aneurysm sac measures 7.5 x 6.7 cm, previously having measured approximately 7.3 x 7.0 cm. Evaluation of the vascular lumen is limited without IV contrast. Stable atherosclerosis of the aorta and its branches. No pathologic adenopathy. Reproductive: Prior prostatectomy.  Penile prosthesis unchanged. Other: No free fluid or free intraperitoneal gas. No abdominal wall hernia. Musculoskeletal: No acute or destructive bony abnormalities. Reconstructed images demonstrate no additional findings. IMPRESSION: 1. Known infrarenal abdominal aortic aneurysm status post endoluminal stent graft repair, outer  diameter of the aneurysm sac measuring 7.5 x 6.7 cm. No significant change since prior study. Evaluation of the vascular lumen is limited without IV contrast, though no evidence of aneurysm rupture on this unenhanced exam. 2. Diffuse colonic diverticulosis without diverticulitis. 3. End-stage renal disease, with stable indeterminate exophytic 1.2 cm lesion lower pole left kidney. If the patient would be a therapy candidate should neoplasm be detected, nonemergent outpatient dedicated renal MRI could be considered. 4.  Aortic Atherosclerosis (ICD10-I70.0). Electronically Signed   By: Sharlet Salina M.D.   On: 11/16/2022 22:07     Radiology CT Angio Chest/Abd/Pel for Dissection W and/or Wo Contrast  Result Date: 11/17/2022 CLINICAL DATA:  82 year old male with history of left-sided abdominal pain and dizziness. Known history of abdominal aortic aneurysm. Suspected acute aortic syndrome. EXAM: CT ANGIOGRAPHY CHEST, ABDOMEN AND PELVIS TECHNIQUE: Non-contrast CT of the chest was initially obtained. Multidetector CT imaging through the chest, abdomen and pelvis was performed using the standard protocol during bolus administration of intravenous contrast. Multiplanar reconstructed images and MIPs were obtained and reviewed to evaluate the vascular anatomy. RADIATION DOSE REDUCTION: This exam was performed according to the departmental dose-optimization program which includes automated exposure control, adjustment of the mA and/or kV according to patient size and/or use of iterative reconstruction technique. CONTRAST:  OMNIPAQUE IOHEXOL 350 MG/ML SOLN COMPARISON:  CT the abdomen and pelvis without contrast 11/16/2022. CTA of the abdomen and pelvis 10/11/2022. CTA of the chest, abdomen and pelvis 07/06/2022. FINDINGS: CTA CHEST FINDINGS Cardiovascular: Precontrast images demonstrate no crescentic high attenuation associated with the wall of the thoracic aorta to suggest the presence of acute intramural  hemorrhage. Postcontrast images demonstrate no evidence of thoracic aortic aneurysm or dissection. Ascending thoracic aorta is ectatic measuring up to 4.1 cm in diameter. Mid arch and descending thoracic aorta measure 3.3 cm and 2.8 cm in diameter respectively. There is aortic atherosclerosis, as well as atherosclerosis of the great vessels of the mediastinum and the coronary arteries, including calcified atherosclerotic plaque in the left main, left anterior descending, left circumflex and right coronary arteries. Aberrant right subclavian artery (normal anatomical variant) incidentally noted. Thickening and calcification of the aortic valve. Heart size is normal. There is no significant pericardial fluid, thickening or pericardial calcification. Mediastinum/Nodes: No pathologically enlarged mediastinal or hilar lymph nodes. Esophagus is unremarkable in appearance. No axillary lymphadenopathy. Lungs/Pleura: No acute consolidative airspace disease. No pleural effusions. No pneumothorax. Scattered areas of scarring in the  lung bases bilaterally. No definite suspicious appearing pulmonary nodules or masses are noted. Musculoskeletal: There are no aggressive appearing lytic or blastic lesions noted in the visualized portions of the skeleton. Review of the MIP images confirms the above findings. CTA ABDOMEN AND PELVIS FINDINGS VASCULAR Aorta: Extensive aortic atherosclerosis with large fusiform infrarenal abdominal aortic aneurysm again noted, measuring up to 7.6 x 6.8 cm. Patient is status post aorto bi-iliac stent graft placement. The graft is widely patent. 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 No definite findings of into the confidently identified on today's study. Celiac: Patent without evidence of aneurysm, dissection, vasculitis or  significant stenosis. SMA: Patent without evidence of aneurysm, dissection, vasculitis or significant stenosis. Renals: Atherosclerosis in the renal arteries bilaterally with probable high-grade stenosis at the ostium of the left renal artery in moderate stenosis at the ostium of the right renal artery. 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 Inflow: Patent without evidence of aneurysm, dissection, vasculitis or significant stenosis. Veins: No obvious venous abnormality within the limitations of this arterial phase study. Review of the MIP images confirms the above findings. NON-VASCULAR Hepatobiliary: Liver has a shrunken appearance and nodular contour indicative of underlying cirrhosis. No suspicious cystic or solid hepatic lesions are confidently identified on today's arterial phase examination. No intra or extrahepatic biliary ductal dilatation. Status post cholecystectomy. Portal vein is dilated measuring 1.8 cm. Pancreas: No pancreatic mass. No pancreatic ductal dilatation. No pancreatic or peripancreatic fluid collections or inflammatory changes. Spleen: Spleen is enlarged measuring 14.0 x 7.3 x 11.8 cm (estimated splenic volume of 603 mL). Adrenals/Urinary Tract: Severe atrophy of the kidneys bilaterally. Multiple small renal lesions are noted ranging from low to intermediate to high attenuation, incompletely characterized on today's arterial phase examination, but most notable for an exophytic 1.3 cm lesion in the lower pole of the left kidney which is 60 HU (similar in size to prior examination from 2023, but higher in attenuation than the comparison study). No hydroureteronephrosis. Urinary bladder is completely decompressed. Bilateral adrenal glands are normal in appearance. Stomach/Bowel: The appearance of the stomach is normal. There is no pathologic dilatation of small bowel or colon. Numerous colonic diverticula are noted,  particularly in the sigmoid colon, without surrounding inflammatory changes to indicate an acute diverticulitis at this time. Normal appendix. Lymphatic: No lymphadenopathy noted in the abdomen or pelvis. Reproductive: Prostate gland is either diminutive or surgically absent. Penile prosthesis partially imaged with bilaterally reservoirs, one in the right inguinal region and the other in the low left hemipelvis. Other: No significant volume of ascites.  No pneumoperitoneum. Musculoskeletal: There are no aggressive appearing lytic or blastic lesions noted in the visualized portions of the skeleton. Review of the MIP images confirms the above findings. IMPRESSION: 1. No definite evidence of acute aortic syndrome in the chest, abdomen or pelvis. 2. Large chronic infrarenal abdominal aortic aneurysm redemonstrated, currently measuring 7.6 x 6.8 cm (previously 7.3 x 7.0 cm on 10/11/2022). No signs of active extravasation or endoleak on today's study. 3. 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 5. Indeterminate lesion in the lower  pole of the left kidney redemonstrated. Given the chronicity of this finding, this is favored to represent a small proteinaceous/hemorrhagic cysts and would require follow-up nonemergent outpatient MRI of the abdomen with and without IV gadolinium to provide definitive characterization if of clinical concern. 6. Colonic diverticulosis without evidence of acute diverticulitis at this time. 7. 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 No suspicious hepatic lesions are noted at this time. 8. 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 9.  Additional incidental findings, as above. Electronically Signed   By: Trudie Reed M.D.   On: 11/17/2022 05:37   CT ABDOMEN PELVIS WO CONTRAST  Result Date: 11/16/2022 CLINICAL DATA:  Left lower quadrant abdominal pain, known abdominal aortic aneurysm EXAM: CT ABDOMEN AND PELVIS WITHOUT CONTRAST TECHNIQUE: Multidetector CT imaging of the abdomen and pelvis was performed following the standard protocol without IV contrast. Unenhanced CT was performed per clinician order. Lack of IV contrast limits sensitivity and specificity, especially for evaluation of abdominal/pelvic solid viscera and vascular structures. RADIATION DOSE REDUCTION: This exam was performed according to the departmental dose-optimization program which includes automated exposure control, adjustment of the mA and/or kV according to patient size and/or use of iterative reconstruction technique. COMPARISON:  10/11/2022 FINDINGS: Lower chest: No acute pleural or parenchymal lung disease. Chronic elevation left hemidiaphragm. Hepatobiliary: Cholecystectomy. Unremarkable unenhanced appearance of the liver. Pancreas: Unremarkable unenhanced appearance. Spleen: Unremarkable unenhanced appearance. Adrenals/Urinary Tract: Bilateral renal atrophy consistent with end-stage renal disease. Indeterminate exophytic lesion lower pole left kidney measuring 1.2 cm, attenuation of 51 HU. Other simple appearing renal cysts are unchanged and do not require follow-up. The adrenals are unremarkable. The bladder is decompressed, limiting its evaluation. Stomach/Bowel: No bowel obstruction or ileus. Normal appendix right lower quadrant. Diffuse colonic diverticulosis without evidence of acute diverticulitis. No bowel wall thickening or inflammatory change. Vascular/Lymphatic: Known infrarenal abdominal aortic aneurysm status post prior endoluminal stent graft repair. Outer diameter of the aneurysm sac measures 7.5 x 6.7 cm, previously having measured approximately  7.3 x 7.0 cm. Evaluation of the vascular lumen is limited without IV contrast. Stable atherosclerosis of the aorta and its branches. No pathologic adenopathy. Reproductive: Prior prostatectomy.  Penile prosthesis unchanged. Other: No free fluid or free intraperitoneal gas. No abdominal wall hernia. Musculoskeletal: No acute or destructive bony abnormalities. Reconstructed images demonstrate no additional findings. IMPRESSION: 1. Known infrarenal abdominal aortic aneurysm status post endoluminal stent graft repair, outer diameter of the aneurysm sac measuring 7.5 x 6.7 cm. No significant change since prior study. Evaluation of the vascular lumen is limited without IV contrast, though no evidence of aneurysm rupture on this unenhanced exam. 2. Diffuse colonic diverticulosis without diverticulitis. 3. End-stage renal disease, with stable indeterminate exophytic 1.2 cm lesion lower pole left kidney. If the patient would be a therapy candidate should neoplasm be detected, nonemergent outpatient dedicated renal MRI could be considered. 4.  Aortic Atherosclerosis (ICD10-I70.0). Electronically Signed   By: Sharlet Salina M.D.   On: 11/16/2022 22:07    Procedures Procedures    Medications Ordered in ED Medications  acetaminophen (TYLENOL) tablet 1,000 mg (has no administration in time range)  lidocaine (LIDODERM) 5 % 1 patch (has no administration in time range)  iohexol (OMNIPAQUE) 350 MG/ML injection 80 mL (100 mLs Intravenous Contrast Given 11/17/22 0449)    ED Course/ Medical Decision Making/ A&P  Medical Decision Making Patient with LLQ pain and concern for endovascular leak   Amount and/or Complexity of Data Reviewed External Data Reviewed: labs and notes.    Details: Previous notes reviewed  Labs: ordered.    Details: Normal white count 7.7, hemoglobin slight low 11, normal platelets.  Normal sodium 135, normal potassium 4.5, elevated creatinine 6.70, lipase normal  35  Radiology: ordered and independent interpretation performed.    Details: Endovascular graft in place, no free fluid by me  Discussion of management or test interpretation with external provider(s): Case d/w Dr. Myra Gianotti, no acute findings on CTA to explain symptoms.    Risk OTC drugs. Prescription drug management. Risk Details: I suspect the pain is MSk in nature and will start lidoderm.  Patient will need to follow up with dialysis and PMD for ongoing care.  Stable for discharge with close follow up     Final Clinical Impression(s) / ED Diagnoses Final diagnoses:  Renal lesion  ESRD (end stage renal disease) (HCC)   Return for intractable cough, coughing up blood, fevers > 100.4 unrelieved by medication, shortness of breath, intractable vomiting, chest pain, shortness of breath, weakness, numbness, changes in speech, facial asymmetry, abdominal pain, passing out, Inability to tolerate liquids or food, cough, altered mental status or any concerns. No signs of systemic illness or infection. The patient is nontoxic-appearing on exam and vital signs are within normal limits.  I have reviewed the triage vital signs and the nursing notes. Pertinent labs & imaging results that were available during my care of the patient were reviewed by me and considered in my medical decision making (see chart for details). After history, exam, and medical workup I feel the patient has been appropriately medically screened and is safe for discharge home. Pertinent diagnoses were discussed with the patient. Patient was given return precautions.  Rx / DC Orders ED Discharge Orders     None         Janele Lague, MD 11/17/22 [phone]

## 2022-11-22 ENCOUNTER — Other Ambulatory Visit: Payer: Self-pay | Admitting: Cardiology

## 2022-11-22 DIAGNOSIS — I480 Paroxysmal atrial fibrillation: Secondary | ICD-10-CM

## 2022-11-22 NOTE — Telephone Encounter (Signed)
Eliquis 2.5mg  refill request received. Patient is 82 years old, weight-84.4kg, Crea-6.70 on 11/16/22, Diagnosis-Afib, and last seen by Dr. [place] on 11/07/22. Dose is appropriate based on dosing criteria. Will send in refill to requested pharmacy.

## 2022-11-24 ENCOUNTER — Encounter: Payer: Self-pay | Admitting: Cardiology

## 2023-01-31 ENCOUNTER — Other Ambulatory Visit: Payer: Self-pay

## 2023-01-31 ENCOUNTER — Emergency Department (HOSPITAL_COMMUNITY)
Admission: EM | Admit: 2023-01-31 | Discharge: 2023-01-31 | Disposition: A | Discharge status: Home / Self Care | Payer: Medicare Other | Attending: Emergency Medicine | Admitting: Emergency Medicine

## 2023-01-31 ENCOUNTER — Encounter (HOSPITAL_COMMUNITY): Payer: Self-pay

## 2023-01-31 ENCOUNTER — Emergency Department (HOSPITAL_COMMUNITY): Payer: Medicare Other

## 2023-01-31 DIAGNOSIS — F0390 Unspecified dementia without behavioral disturbance: Secondary | ICD-10-CM | POA: Diagnosis not present

## 2023-01-31 DIAGNOSIS — Z79899 Other long term (current) drug therapy: Secondary | ICD-10-CM | POA: Diagnosis not present

## 2023-01-31 DIAGNOSIS — Z7901 Long term (current) use of anticoagulants: Secondary | ICD-10-CM | POA: Diagnosis not present

## 2023-01-31 DIAGNOSIS — Z992 Dependence on renal dialysis: Secondary | ICD-10-CM | POA: Diagnosis not present

## 2023-01-31 DIAGNOSIS — R1084 Generalized abdominal pain: Secondary | ICD-10-CM | POA: Diagnosis present

## 2023-01-31 DIAGNOSIS — N186 End stage renal disease: Secondary | ICD-10-CM | POA: Insufficient documentation

## 2023-01-31 LAB — COMPREHENSIVE METABOLIC PANEL
ALT: 49 U/L — ABNORMAL HIGH (ref 0–44)
AST: 45 U/L — ABNORMAL HIGH (ref 15–41)
Albumin: 3.2 g/dL — ABNORMAL LOW (ref 3.5–5.0)
Alkaline Phosphatase: 272 U/L — ABNORMAL HIGH (ref 38–126)
Anion gap: 12 (ref 5–15)
BUN: 43 mg/dL — ABNORMAL HIGH (ref 8–23)
CO2: 28 mmol/L (ref 22–32)
Calcium: 7.7 mg/dL — ABNORMAL LOW (ref 8.9–10.3)
Chloride: 92 mmol/L — ABNORMAL LOW (ref 98–111)
Creatinine, Ser: 5.51 mg/dL — ABNORMAL HIGH (ref 0.61–1.24)
GFR, Estimated: 10 mL/min — ABNORMAL LOW (ref >60)
Glucose, Bld: 143 mg/dL — ABNORMAL HIGH (ref 70–99)
Potassium: 3.2 mmol/L — ABNORMAL LOW (ref 3.5–5.1)
Sodium: 132 mmol/L — ABNORMAL LOW (ref 135–145)
Total Bilirubin: 0.9 mg/dL (ref <1.2)
Total Protein: 6.9 g/dL (ref 6.5–8.1)

## 2023-01-31 LAB — CBC WITH DIFFERENTIAL/PLATELET
Abs Immature Granulocytes: 0.03 10*3/uL (ref 0.00–0.07)
Basophils Absolute: 0 10*3/uL (ref 0.0–0.1)
Basophils Relative: 0 %
Eosinophils Absolute: 0.1 10*3/uL (ref 0.0–0.5)
Eosinophils Relative: 1 %
HCT: 31.2 % — ABNORMAL LOW (ref 39.0–52.0)
Hemoglobin: 10.2 g/dL — ABNORMAL LOW (ref 13.0–17.0)
Immature Granulocytes: 1 %
Lymphocytes Relative: 9 %
Lymphs Abs: 0.5 10*3/uL — ABNORMAL LOW (ref 0.7–4.0)
MCH: 32.8 pg (ref 26.0–34.0)
MCHC: 32.7 g/dL (ref 30.0–36.0)
MCV: 100.3 fL — ABNORMAL HIGH (ref 80.0–100.0)
Monocytes Absolute: 0.6 10*3/uL (ref 0.1–1.0)
Monocytes Relative: 11 %
Neutro Abs: 4.6 10*3/uL (ref 1.7–7.7)
Neutrophils Relative %: 78 %
Platelets: 124 10*3/uL — ABNORMAL LOW (ref 150–400)
RBC: 3.11 MIL/uL — ABNORMAL LOW (ref 4.22–5.81)
RDW: 15.7 % — ABNORMAL HIGH (ref 11.5–15.5)
WBC: 5.9 10*3/uL (ref 4.0–10.5)
nRBC: 0 % (ref 0.0–0.2)

## 2023-01-31 LAB — LIPASE, BLOOD: Lipase: 51 U/L (ref 11–51)

## 2023-01-31 MED ORDER — MORPHINE SULFATE (PF) 4 MG/ML IV SOLN
4.0000 mg | Freq: Once | INTRAVENOUS | Status: AC
  Administered 2023-01-31: 4 mg via INTRAVENOUS
  Filled 2023-01-31: qty 1

## 2023-01-31 MED ORDER — IOHEXOL 350 MG/ML SOLN
100.0000 mL | Freq: Once | INTRAVENOUS | Status: AC | PRN
  Administered 2023-01-31: 100 mL via INTRAVENOUS

## 2023-01-31 NOTE — ED Triage Notes (Signed)
Pt arrived reporting L side/hip pain since Saturday. Does dialysis Tuesday Thursday and Saturdays. Had it today and ended early to come due to the pain. Able to stand and bear weight with walker. Patient denies any injury. Does not radiate down.

## 2023-01-31 NOTE — ED Provider Notes (Signed)
[place] EMERGENCY DEPARTMENT AT [facility] Provider Note   CSN: [id number] Arrival date & time: 01/31/23  1548     History  Chief Complaint  Patient presents with   Flank Pain    Ricky Harris is a 82 y.o. male.  The history is provided by the patient and a relative. The history is limited by the condition of the patient.  Flank Pain Associated symptoms include abdominal pain.  Patient presents to the ER with his his daughter-in-law complaining of generalized abdominal and flank pain that localizes to the left flank and abdomen.  Previous history of abdominal aortic aneurysm, dementia, and ESRD currently undergoing dialysis and no longer producing.  According to daughter in law, he was picked up from dialysis today that was stopped early due to pain.  He has not taken any pain medication today.  Previously seen for similar issue on 9/18 which showed a 7.6 x 6.8 cm infrarenal AAA with no endoleak or extravasation at the time.  Denies fever, nausea, vomiting, melena, hematochezia.     Home Medications Prior to Admission medications   Medication Sig Start Date End Date Taking? Authorizing Provider  atorvastatin (LIPITOR) 40 MG tablet Take 1 tablet (40 mg total) by mouth daily. Patient taking differently: Take 40 mg by mouth at bedtime. 04/28/21   Noralee Stain, DO  donepezil (ARICEPT) 5 MG tablet Take 5 mg by mouth at bedtime. 03/01/22   [provider]  doxycycline (VIBRAMYCIN) 100 MG capsule Take 100 mg by mouth 2 (two) times daily. 11/05/22   [provider]  ELIQUIS 2.5 MG TABS tablet TAKE 1 TABLET(2.5 MG) BY MOUTH TWICE DAILY. 11/22/22   [place], Peter M, MD  levothyroxine (SYNTHROID) 50 MCG tablet Take 1 tablet (50 mcg total) by mouth daily. 04/27/21   Noralee Stain, DO  lidocaine (LIDODERM) 5 % Place 1 patch onto the skin daily. Remove & Discard patch within 12 hours or as directed by MD 11/17/22   Nicanor Alcon, April, MD  midodrine (PROAMATINE) 10 MG tablet  Take 1 tablet (10 mg total) by mouth daily as needed (takes 3 days a week on dialysis days). 11/07/22   [place], Peter M, MD  multivitamin (RENA-VIT) TABS tablet Take 1 tablet by mouth daily. Patient taking differently: Take 1 tablet by mouth at bedtime. 09/27/19   Autry-Lott, Randa Evens, DO  nitroGLYCERIN (NITROSTAT) 0.4 MG SL tablet Place 0.4 mg under the tongue every 5 (five) minutes as needed for chest pain.    [provider]  traMADol (ULTRAM) 50 MG tablet Take 1 tablet (50 mg total) by mouth every 8 (eight) hours as needed for moderate pain. 03/12/19   Rhyne, Ames Coupe, PA-C      Allergies    Cephalexin and Hydralazine    Review of Systems   Review of Systems  Gastrointestinal:  Positive for abdominal pain.  Genitourinary:  Positive for flank pain.  All other systems reviewed and are negative.   Physical Exam Updated Vital Signs BP (!) 110/58   Pulse 87   Temp 98.5 F (36.9 C)   Resp 20   Ht 6' (1.829 m)   Wt 84.4 kg   SpO2 96%   BMI 25.24 kg/m  Physical Exam Vitals and nursing note reviewed.  Constitutional:      General: He is not in acute distress. HENT:     Head: Normocephalic and atraumatic.  Eyes:     General:        Right eye: No  discharge.        Left eye: No discharge.     Extraocular Movements: Extraocular movements intact.  Cardiovascular:     Rate and Rhythm: Normal rate and regular rhythm.     Pulses: Normal pulses.     Heart sounds: Murmur (Previously diagnosed) heard.  Pulmonary:     Effort: Pulmonary effort is normal. No respiratory distress.     Breath sounds: Normal breath sounds. No stridor. No wheezing, rhonchi or rales.  Chest:     Chest wall: No tenderness.  Abdominal:     General: Abdomen is flat. There is no distension.     Palpations: Abdomen is soft. There is mass (Enlarged aorta felt on exam).     Tenderness: There is abdominal tenderness (Generalized abdominal pain with left-sided tenderness that radiates to the left flank).  There is guarding.  Skin:    General: Skin is warm and dry.     Coloration: Skin is not jaundiced or pale.     Findings: No erythema.  Neurological:     Mental Status: He is alert. Mental status is at baseline.     ED Results / Procedures / Treatments   Labs (all labs ordered are listed, but only abnormal results are displayed) Labs Reviewed  CBC WITH DIFFERENTIAL/PLATELET - Abnormal; Notable for the following components:      Result Value   RBC 3.11 (*)    Hemoglobin 10.2 (*)    HCT 31.2 (*)    MCV 100.3 (*)    RDW 15.7 (*)    Platelets 124 (*)    Lymphs Abs 0.5 (*)    All other components within normal limits  COMPREHENSIVE METABOLIC PANEL - Abnormal; Notable for the following components:   Sodium 132 (*)    Potassium 3.2 (*)    Chloride 92 (*)    Glucose, Bld 143 (*)    BUN 43 (*)    Creatinine, Ser 5.51 (*)    Calcium 7.7 (*)    Albumin 3.2 (*)    AST 45 (*)    ALT 49 (*)    Alkaline Phosphatase 272 (*)    GFR, Estimated 10 (*)    All other components within normal limits  LIPASE, BLOOD    EKG None  Radiology CT Angio Chest/Abd/Pel for Dissection W and/or Wo Contrast  Result Date: 01/31/2023 CLINICAL DATA:  Follow-up known aortic aneurysm. EXAM: CT ANGIOGRAPHY CHEST, ABDOMEN AND PELVIS TECHNIQUE: Non-contrast CT of the chest was initially obtained. Multidetector CT imaging through the chest, abdomen and pelvis was performed using the standard protocol during bolus administration of intravenous contrast. Multiplanar reconstructed images and MIPs were obtained and reviewed to evaluate the vascular anatomy. RADIATION DOSE REDUCTION: This exam was performed according to the departmental dose-optimization program which includes automated exposure control, adjustment of the mA and/or kV according to patient size and/or use of iterative reconstruction technique. CONTRAST:  OMNIPAQUE IOHEXOL 350 MG/ML SOLN COMPARISON:  CT dated 11/17/2022. FINDINGS: CTA CHEST FINDINGS  Cardiovascular: Top-normal cardiac size. No pericardial effusion. Advanced 3 vessel coronary vascular calcification. There is advanced atherosclerotic calcification of the thoracic aorta. No aneurysmal dilatation or dissection. 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 The origins of the great vessels of the aortic arch appear patent. No pulmonary artery embolus identified. Mediastinum/Nodes: No hilar or mediastinal adenopathy. The esophagus is grossly unremarkable. No mediastinal fluid collection. Lungs/Pleura: Minimal right lung base subpleural atelectasis/scarring. There is eventration of the left hemidiaphragm  with left lung base atelectasis. Linear atelectasis/scarring in the lingula. No consolidative changes. There is no pleural effusion pneumothorax. The central airways are patent. Musculoskeletal: Osteopenia with degenerative changes. No acute osseous pathology. Review of the MIP images confirms the above findings. CTA ABDOMEN AND PELVIS FINDINGS VASCULAR Aorta: Advanced atherosclerotic calcification of the abdominal aorta. Status post aorto bi iliac endovascular stent graft repair of an infrarenal abdominal aortic aneurysm. No significant interval change in the size of the excluded sac measuring up to 7.9 cm in greatest axial diameter. Celiac: The celiac artery and its major branches are patent. SMA: The SMA is patent. Renals: Advanced atherosclerotic calcification of the renal artery ostia. The renal arteries remain patent. 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 Inflow: The iliac limbs of the endovascular stent graft as well as bilateral iliac arteries are patent. Veins: No obvious venous abnormality within the limitations of this arterial phase study. Review of the MIP images confirms the above findings. NON-VASCULAR No intra-abdominal free air or free fluid. Hepatobiliary: Cirrhosis. No biliary dilatation.  Cholecystectomy. No retained calcified stone noted in the central CBD. Pancreas: Unremarkable. No pancreatic ductal dilatation or surrounding inflammatory changes. Spleen: Normal in size without focal abnormality. Adrenals/Urinary Tract: The adrenal glands unremarkable. Severe bilateral renal parenchyma atrophy. Similar appearance of bilateral renal exophytic lesions not characterized on this CT. There is no hydronephrosis on either side. The visualized ureters appear unremarkable. The urinary bladder is collapsed. Stomach/Bowel: There is severe distal colonic diverticulosis without active inflammatory changes. There is no bowel obstruction or active inflammation. The appendix is normal. Lymphatic: No adenopathy. Reproductive: Prostatectomy. Penile implant with reservoir in the right groin. Other: None Musculoskeletal: Osteopenia with degenerative changes. No acute osseous pathology. Review of the MIP images confirms the above findings. IMPRESSION: 1. Prior aorto bi iliac endovascular stent graft repair of an infrarenal abdominal aortic aneurysm. No significant interval change in the size of the excluded sac. 2. No acute intrathoracic, abdominal, or pelvic pathology. 3. Cirrhosis. 4. Severe colonic diverticulosis. No bowel obstruction. Normal appendix. 5.  Aortic Atherosclerosis (ICD10-I70.0). Electronically Signed   By: Elgie Collard M.D.   On: 01/31/2023 20:41    Procedures Procedures    Medications Ordered in ED Medications  morphine (PF) 4 MG/ML injection 4 mg (4 mg Intravenous Given 01/31/23 1747)  iohexol (OMNIPAQUE) 350 MG/ML injection 100 mL (100 mLs Intravenous Contrast Given 01/31/23 1853)    ED Course/ Medical Decision Making/ A&P                                 Medical Decision Making    Patient presents to the ED for concern of generalized domino pain that when palpated localizes to the left side of his abdomen and radiates to his left flank, this involves an extensive number of  treatment options, and is a complaint that carries with it a high risk of complications and morbidity.  The differential diagnosis includes but not limited to mesenteric ischemia, endoleak, diverticulitis, gastroenteritis   Co morbidities that complicate the patient evaluation  Dementia, AAA with endothelial repair, CKD on hemodialysis,   Additional history obtained:  Additional history obtained from  Family, Nursing, Outside Medical Records, and Past Admission     Lab Tests:  I Ordered, and personally interpreted labs.  The pertinent results include:   NA 132 Potassium 3.2 both are likely low due to just having undergone hemodialysis  Imaging Studies ordered:  I ordered imaging studies including CT angio chest, abdomen, pelvis I independently visualized and interpreted imaging which showed no acute pathology with chronic AAA, diverticulosis I agree with the radiologist interpretation    Medicines ordered and prescription drug management:  I ordered medication including morphine for pain Reevaluation of the patient after these medicines showed that the patient improved I have reviewed the patients home medicines and have made adjustments as needed   Consultations Obtained:  I requested consultation with the attending,  and discussed lab and imaging findings as well as pertinent plan - they recommend: Due to patient not experiencing any emergent pathology patient is safe to go home.   Problem List / ED Course:  Generalized abdominal pain --patient is an 82 year old male presents to the ED today with his daughter-in-law and son after undergoing 1 hour of hemodialysis For generalized abdominal pain that is localized to the left side of his abdomen and radiates to his left flank.  He has been seen for this before on 9/18 with no acute findings and discharged.  Previous history of AAA with endothelial repair, dementia, CKD on hemodialysis.  Patient's son and daughter-in-law  were the ones giving the story due to patient's dementia.  On exam patient was in no acute distress.  Upon palpation of the abdomen was exquisitely tender with pain localizing to the left side of his abdomen and left flank.  Family denies melena, hematochezia, fever, change in mental status.  Patient was given 4 mg of morphine for pain which improved.  CT angio of chest, abdomen, pelvis were done which showed no acute findings.  At this time patient is stable, and minimal pain, and is safe to be discharged.  Recommended follow-up with PCP for further evaluation of abdominal pain and better pain management.  Patient was also recommended to use Salonpas and Tylenol for further pain.  Patient also has prescribed tramadol which she was also frequent.  Patient discharged.  Reevaluation:  After the interventions noted above, I reevaluated the patient and found that they have :improved   Social Determinants of Health:  Undergoing hemodialysis  Living with children.   Dispostion:  After consideration of the diagnostic results and the patients response to treatment, I feel that the patent would benefit from discharge with treatment as above.    Final Clinical Impression(s) / ED Diagnoses Final diagnoses:  Generalized abdominal pain    Rx / DC Orders ED Discharge Orders     None     Portions of this report may have been transcribed using voice recognition software. Every effort was made to ensure accuracy; however, inadvertent computerized transcription errors may be present.     Lavonia Drafts 01/31/23 2136    Charlynne Pander, MD 01/31/23 [phone]

## 2023-01-31 NOTE — Discharge Instructions (Signed)
You were seen in the ER today abdominal pain radiating into left flank.  You should follow-up with PCP for further workup and consulting for prior authorization for lidocaine patches.  You can use over-the-counter Salonpas which is very similar to the lidocaine patches you been using but did not require prior authorization needed for insurance and are much cheaper.  You can apply these to problem areas and will help with with the pain.  You can also use OTC Tylenol for further pain relief. Take Tylenol (acetominophen)  500 mg every 4-6 hours, as needed for pain or fever. Do not take more than 3500 mg in a 24-hour period. As this may cause liver damage. While this is rare, if you begin to develop yellowing of the skin or eyes, stop taking and return to ER immediately.  If you are having worsening abdominal pain, shortness of breath, chest pain, bloody stool, worsening weakness, fever return to the ER for further evaluation

## 2023-03-09 NOTE — Progress Notes (Signed)
 Ricky Harris is a 83 y.o.male.  Subjective:    This patient is in today to have me evaluate him in follow-up from an emergency department visit there was about a month ago.  He had been at his dialysis and stopped the dialysis early because he was having significant pain and discomfort in his left flank.  He has persisted with the pain and discomfort and when I ask him about today he brings it up that it still can cause him some discomfort but is not as bad as it was and in fact he does not appear to be in pain today.  His daughter-in-law who is with him today states that indeed he has not complained of pain to them.  He has kept tramadol  on hand that he uses quite infrequently.  In fact we talked about use of this medication and chronic kidney disease with dialysis that I would want to be quite infrequent but it does help him when he has had pain in the past.  The emergency doctors had discussed him getting prior approval for lidocaine  patches.  He used them some in the past but they did not do much for him.  They had suggested Salonpas.  I talked to them about menthol  patches and that is a reasonable thought.  We talked about other creams that he could use including capsaicin but that may burn or cause issues if he got it in other places on his body.  We went through his CT together.  There is cirrhosis of the liver.  There is the aneurysm which appears to be doing well.  I agree with the ER having discharged him with the recommendation of the Tylenol  and the Salonpas.  The patient has been hesitant to use Tylenol  because of it being ineffective for him as well as his health problems.  He does not use any anti-inflammatory.  We went down his med list together.  I have updated it to be more accurate.  Plan:    This man has had recurrent left flank pain.  He looks okay overall on examination and appears to be stable.  If issues became more prominent with recurrence of pain I would support use of morphine  for him  on an as-needed basis.  An abdominal aneurysm appears stable.  Hypothyroidism appears stable with the TSH measured about 4 months ago.  He may be able to get the TSH measured at the dialysis center.  Cirrhosis appears stable.  Paroxysmal atrial fibrillation remains on Eliquis  and appears stable.  Will remain available for him as needed.  Today I have evaluated and managed two or more of this patient's chronic stable/unstable health problems.  Today I have evaluated and managed this patient with a health problem(s) considered moderate risk for morbidity and requiring further testing and/or treatment.  _____________________________________      Diagnoses this Encounter 1. Left flank pain   2. Paroxysmal atrial fibrillation (HCC)   3. Other cirrhosis of liver (CMD)   4. Acquired hypothyroidism   5. Abdominal aortic aneurysm (AAA) without rupture, unspecified part (CMD)    Chief Complaint  Patient presents with  . ER follow up    Patient's Medications  New Prescriptions   No medications on file  Previous Medications   APIXABAN  (ELIQUIS ) 2.5 MG TAB    Take 1 tablet (2.5 mg total) by mouth 2 (two) times a day.   ATORVASTATIN  (LIPITOR ) 40 MG TABLET    Take 1 tablet (40 mg total) by  mouth daily.   CALCIUM  ACETATE (PHOSLO ) 667 MG (169 MG CALCIUM ) CAPSULE    Take 667 mg by mouth 3 (three) times a day.   CETIRIZINE (ZYRTEC) 10 MG TABLET    Take 10 mg by mouth daily as needed.   DONEPEZIL  (ARICEPT ) 5 MG TABLET    Take 1 tablet (5 mg total) by mouth nightly.   LEVOTHYROXINE  (SYNTHROID ) 50 MCG TABLET    TAKE 1 TABLET(50 MCG) BY MOUTH DAILY AT 6 AM   MIDODRINE  (PROAMATINE ) 5 MG TABLET    Take 5 mg by mouth. Take on dialysis days   NITROGLYCERIN  (NITROSTAT ) 0.4 MG SL TABLET    Place 0.4 mg under the tongue every 5 (five) minutes as needed for chest pain.   TRAMADOL  (ULTRAM ) 50 MG TABLET    Take 1 tablet (50 mg total) by mouth every 6 (six) hours as needed for severe pain (7-10).  Modified  Medications   No medications on file  Discontinued Medications   ACETAMINOPHEN  (TYLENOL ) 500 MG TABLET    Take 1,000 mg by mouth every 6 (six) hours as needed for mild pain (1-3).   ASPIRIN  81 MG CHEWABLE TABLET    Take 81 mg by mouth Once Daily.   MULTIVITAMIN (THERAGRAN) TAB TABLET    Take 1 tablet by mouth Once Daily.   PREDNISONE  (DELTASONE ) 20 MG TABLET    Take 20 mg by mouth daily. For 4 days   TRIAMCINOLONE (KENALOG) 0.1 % CREAM    Apply to rash sparingly twice daily as needed for itching.   No orders of the defined types were placed in this encounter.  Physical Exam Constitutional:      General: He is not in acute distress.    Appearance: He is not toxic-appearing or diaphoretic.     Comments: Chronically ill, elderly.  No acute distress.  HENT:     Head: Normocephalic and atraumatic.     Mouth/Throat:     Mouth: Mucous membranes are moist.  Eyes:     General: No scleral icterus.       Right eye: No discharge.        Left eye: No discharge.  Neck:     Vascular: No carotid bruit.  Cardiovascular:     Rate and Rhythm: Normal rate and regular rhythm.     Heart sounds: No murmur heard.    No friction rub.  Pulmonary:     Effort: No respiratory distress.     Breath sounds: Decreased breath sounds present. No wheezing, rhonchi or rales.  Abdominal:     Palpations: Abdomen is soft. There is no hepatomegaly or mass.     Tenderness: There is no abdominal tenderness (Mild diffuse discomfort to the abdomen to palpation.). There is no rebound.  Musculoskeletal:     Cervical back: Neck supple.  Lymphadenopathy:     Cervical: No cervical adenopathy.  Skin:    General: Skin is warm and dry.     Coloration: Skin is not pale.     Findings: No rash.  Neurological:     Mental Status: He is alert.     Gait: Gait normal.  Psychiatric:        Cognition and Memory: Memory is impaired.     _____________________ Vitals:   03/09/23 1616  BP: 160/80  Pulse: 70  Resp: 13  Temp:  98.7 F (37.1 C)  Weight: 80.3 kg (177 lb)  Height: 1.88 m (6' 2)   Body mass index is 22.73 kg/m.  Patient Active Problem List  Diagnosis  . Anemia of chronic disease  . Abdominal aortic aneurysm without rupture (HCC)  . Stage 5 chronic kidney disease on chronic dialysis (HCC)  . Degeneration of lumbar intervertebral disc  . Erectile dysfunction  . Gouty arthropathy  . Essential hypertension  . Prediabetes  . Osteoarthritis  . Renal artery stenosis (HCC)  . HLD (hyperlipidemia)  . History of prostate cancer  . Vitamin B12 deficiency  . Thrombocytopenia (HCC)  . Bilateral chronic knee pain  . Hypothyroidism  . Atherosclerosis of abdominal aorta (HCC)  . Aortic valve sclerosis  . Nuclear sclerotic cataract of left eye  . Nuclear sclerotic cataract of right eye  . Ascites  . Cirrhosis of liver (HCC)  . Trochanteric bursitis of left hip  . Pruritus  . Iron deficiency anemia, unspecified  . Lumbar radiculopathy  . Lumbar spondylosis  . Pure hypercholesterolemia  . 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 Paroxysmal atrial fibrillation (HCC)  . Thoracic aortic aneurysm (HCC)  . History of non-ST elevation myocardial infarction (NSTEMI)  . Conductive hearing loss  . History of itching  . Dementia with behavioral disturbance (HCC)  . Malaise and fatigue  . History of pneumonia   Past Medical History:  Diagnosis Date  . Acute gastric ulcer   . Acute gout   . Acute on chronic diastolic (congestive) heart failure (CMD) 09/15/2020  . Arthritis   . B-complex deficiency   . Cancer (CMD)   . History of prostate cancer   . Hyperlipidemia   . Hypertension   . Intestinal obstruction (CMD)   . Kidney disease   . Malaise and fatigue   . Peptic ulcer without hemorrhage or perforation   . Syncope and collapse   . Thyroid disease    Past Surgical History:  Procedure Laterality Date  . CHOLECYSTECTOMY  1993   Procedure: CHOLECYSTECTOMY  . JOINT  INJECTION Left 09/16/2016   Procedure: HIP / KNEE INJECTION- left hip IA inj;  Surgeon: Louvella Lynwood Home, MD;  Location: [facility];  Service: Orthopedics;  Laterality: Left;  . JOINT INJECTION Left 12/23/2016   Procedure: HIP / KNEE INJECTION- left Hip inj;  Surgeon: Louvella Lynwood Home, MD;  Location: [facility];  Service: Orthopedics;  Laterality: Left;  . PROSTATE SURGERY  2010   Procedure: PROSTATE SURGERY  . TOTAL GASTRECTOMY     Procedure: TOTAL GASTRECTOMY   Family History  Problem Relation Name Age of Onset  . Diabetes Mother    . Hypertension Mother    . Lung cancer Mother     Social History   Socioeconomic History  . Marital status: Divorced    Spouse name: Not on file  . Number of children: Not on file  . Years of education: Not on file  . Highest education level: Not on file  Occupational History  . Not on file  Tobacco Use  . Smoking status: Never  . Smokeless tobacco: Never  Substance and Sexual Activity  . Alcohol use: No  . Drug use: No  . Sexual activity: Not on file  Other Topics Concern  . Not on file  Social History Narrative  . Not on file   Social Drivers of Health   Food Insecurity: Low Risk  (10/17/2022)   Food vital sign   . Within the past 12 months, you worried that your food would run out before you got money to buy more: Never true   .  Within the past 12 months, the food you bought just didn't last and you didn't have money to get more: Never true  Transportation Needs: No Transportation Needs (10/17/2022)   Transportation   . In the past 12 months, has lack of reliable transportation kept you from medical appointments, meetings, work or from getting things needed for daily living? : No  Safety: High Risk (10/17/2022)   Safety   . How often does anyone, including family and friends, physically hurt you?: Never   . How often does anyone, including family and friends, insult or talk down to you?: Sometimes   . How often does anyone,  including family and friends, threaten you with harm?: Never   . How often does anyone, including family and friends, scream or curse at you?: Never  Living Situation: Low Risk  (10/17/2022)   Living Situation   . What is your living situation today?: I have a steady place to live   . Think about the place you live. Do you have problems with any of the following? Choose all that apply:: None/None on this list   Allergies  Allergen Reactions  . Cephalexin Hives  . Hydralazine  Hives    Review of Systems  Constitutional:  Negative for chills and fever.  HENT:  Negative for congestion.   Respiratory:  Negative for cough, shortness of breath and wheezing.   Cardiovascular:  Negative for chest pain and palpitations.  Gastrointestinal:  Negative for abdominal pain, constipation, diarrhea and nausea.  Genitourinary:  Negative for difficulty urinating and dysuria.    Some components of the Review of Systems, Social History, Past Medical History, Family History, and Vital Signs were taken from the patient and documented by the medical assistant assisting the provider today.  Electronically signed by: Tobias Canda Redo, CMA 03/09/2023 4:20 PM

## 2023-04-06 ENCOUNTER — Other Ambulatory Visit: Payer: Self-pay | Admitting: Cardiology

## 2023-04-06 DIAGNOSIS — I480 Paroxysmal atrial fibrillation: Secondary | ICD-10-CM

## 2023-04-06 NOTE — Progress Notes (Signed)
 Per Insurance Patient Is Due For The [employer] Visit. Patient Has Pending Appointment 05/03/2023-added to notes. Based on EFI score, Pt is considered frail and could benefit from Advanced Care Planning discussion at time of visit, No Advanced Care Planning documents found on file. The billing code for more than 15 min of Advanced Care Planning discussion =99497; and if done in [facility] can bill modifier 33.  Thank you, Almarie Specking, CMA [facility]) [facility] Patient Care Advocate [phone]

## 2023-04-06 NOTE — Telephone Encounter (Signed)
 Prescription refill request for Eliquis  received. Indication: Afib  Last office visit:  11/07/22 ([place])  Scr: 5.51 (01/31/23)  Age: 83 Weight: 84.4kg   Appropriate dose. Refill sent.

## 2023-09-19 ENCOUNTER — Emergency Department (HOSPITAL_COMMUNITY)

## 2023-09-19 ENCOUNTER — Encounter (HOSPITAL_COMMUNITY): Payer: Self-pay

## 2023-09-19 ENCOUNTER — Other Ambulatory Visit: Payer: Self-pay

## 2023-09-19 ENCOUNTER — Inpatient Hospital Stay (HOSPITAL_COMMUNITY)
Admission: EM | Admit: 2023-09-19 | Discharge: 2023-09-26 | DRG: 314 | Disposition: A | Discharge status: Skilled Nursing Facility | Attending: Internal Medicine | Admitting: Internal Medicine

## 2023-09-19 DIAGNOSIS — I2510 Atherosclerotic heart disease of native coronary artery without angina pectoris: Secondary | ICD-10-CM | POA: Diagnosis present

## 2023-09-19 DIAGNOSIS — E7800 Pure hypercholesterolemia, unspecified: Secondary | ICD-10-CM | POA: Diagnosis present

## 2023-09-19 DIAGNOSIS — E785 Hyperlipidemia, unspecified: Secondary | ICD-10-CM | POA: Diagnosis present

## 2023-09-19 DIAGNOSIS — I714 Abdominal aortic aneurysm, without rupture, unspecified: Secondary | ICD-10-CM | POA: Diagnosis present

## 2023-09-19 DIAGNOSIS — Z9841 Cataract extraction status, right eye: Secondary | ICD-10-CM

## 2023-09-19 DIAGNOSIS — J189 Pneumonia, unspecified organism: Principal | ICD-10-CM

## 2023-09-19 DIAGNOSIS — Z881 Allergy status to other antibiotic agents status: Secondary | ICD-10-CM

## 2023-09-19 DIAGNOSIS — I480 Paroxysmal atrial fibrillation: Secondary | ICD-10-CM | POA: Diagnosis present

## 2023-09-19 DIAGNOSIS — I5022 Chronic systolic (congestive) heart failure: Secondary | ICD-10-CM | POA: Diagnosis present

## 2023-09-19 DIAGNOSIS — H9190 Unspecified hearing loss, unspecified ear: Secondary | ICD-10-CM | POA: Diagnosis present

## 2023-09-19 DIAGNOSIS — Z9842 Cataract extraction status, left eye: Secondary | ICD-10-CM

## 2023-09-19 DIAGNOSIS — I252 Old myocardial infarction: Secondary | ICD-10-CM

## 2023-09-19 DIAGNOSIS — Z8711 Personal history of peptic ulcer disease: Secondary | ICD-10-CM

## 2023-09-19 DIAGNOSIS — N186 End stage renal disease: Secondary | ICD-10-CM | POA: Diagnosis present

## 2023-09-19 DIAGNOSIS — Z9049 Acquired absence of other specified parts of digestive tract: Secondary | ICD-10-CM

## 2023-09-19 DIAGNOSIS — Z79899 Other long term (current) drug therapy: Secondary | ICD-10-CM

## 2023-09-19 DIAGNOSIS — D696 Thrombocytopenia, unspecified: Secondary | ICD-10-CM | POA: Diagnosis present

## 2023-09-19 DIAGNOSIS — Z9181 History of falling: Secondary | ICD-10-CM

## 2023-09-19 DIAGNOSIS — I9789 Other postprocedural complications and disorders of the circulatory system, not elsewhere classified: Principal | ICD-10-CM | POA: Diagnosis present

## 2023-09-19 DIAGNOSIS — D631 Anemia in chronic kidney disease: Secondary | ICD-10-CM | POA: Diagnosis present

## 2023-09-19 DIAGNOSIS — W1830XA Fall on same level, unspecified, initial encounter: Secondary | ICD-10-CM | POA: Diagnosis present

## 2023-09-19 DIAGNOSIS — K219 Gastro-esophageal reflux disease without esophagitis: Secondary | ICD-10-CM | POA: Diagnosis present

## 2023-09-19 DIAGNOSIS — R296 Repeated falls: Secondary | ICD-10-CM | POA: Diagnosis present

## 2023-09-19 DIAGNOSIS — L8990 Pressure ulcer of unspecified site, unspecified stage: Secondary | ICD-10-CM | POA: Insufficient documentation

## 2023-09-19 DIAGNOSIS — F0390 Unspecified dementia without behavioral disturbance: Secondary | ICD-10-CM | POA: Diagnosis present

## 2023-09-19 DIAGNOSIS — I9589 Other hypotension: Secondary | ICD-10-CM | POA: Diagnosis present

## 2023-09-19 DIAGNOSIS — I2489 Other forms of acute ischemic heart disease: Secondary | ICD-10-CM | POA: Diagnosis not present

## 2023-09-19 DIAGNOSIS — Z7901 Long term (current) use of anticoagulants: Secondary | ICD-10-CM

## 2023-09-19 DIAGNOSIS — R531 Weakness: Secondary | ICD-10-CM | POA: Diagnosis not present

## 2023-09-19 DIAGNOSIS — Z8546 Personal history of malignant neoplasm of prostate: Secondary | ICD-10-CM

## 2023-09-19 DIAGNOSIS — I10 Essential (primary) hypertension: Secondary | ICD-10-CM | POA: Diagnosis present

## 2023-09-19 DIAGNOSIS — Z992 Dependence on renal dialysis: Secondary | ICD-10-CM

## 2023-09-19 DIAGNOSIS — M170 Bilateral primary osteoarthritis of knee: Secondary | ICD-10-CM | POA: Diagnosis present

## 2023-09-19 DIAGNOSIS — Z823 Family history of stroke: Secondary | ICD-10-CM

## 2023-09-19 DIAGNOSIS — I5A Non-ischemic myocardial injury (non-traumatic): Secondary | ICD-10-CM | POA: Diagnosis present

## 2023-09-19 DIAGNOSIS — I447 Left bundle-branch block, unspecified: Secondary | ICD-10-CM | POA: Diagnosis present

## 2023-09-19 DIAGNOSIS — Z66 Do not resuscitate: Secondary | ICD-10-CM | POA: Diagnosis present

## 2023-09-19 DIAGNOSIS — I132 Hypertensive heart and chronic kidney disease with heart failure and with stage 5 chronic kidney disease, or end stage renal disease: Secondary | ICD-10-CM | POA: Diagnosis present

## 2023-09-19 DIAGNOSIS — K7460 Unspecified cirrhosis of liver: Secondary | ICD-10-CM | POA: Diagnosis present

## 2023-09-19 DIAGNOSIS — Z961 Presence of intraocular lens: Secondary | ICD-10-CM | POA: Diagnosis present

## 2023-09-19 DIAGNOSIS — E039 Hypothyroidism, unspecified: Secondary | ICD-10-CM | POA: Diagnosis present

## 2023-09-19 DIAGNOSIS — Z79890 Hormone replacement therapy (postmenopausal): Secondary | ICD-10-CM

## 2023-09-19 DIAGNOSIS — Z833 Family history of diabetes mellitus: Secondary | ICD-10-CM

## 2023-09-19 DIAGNOSIS — Y832 Surgical operation with anastomosis, bypass or graft as the cause of abnormal reaction of the patient, or of later complication, without mention of misadventure at the time of the procedure: Secondary | ICD-10-CM | POA: Diagnosis present

## 2023-09-19 DIAGNOSIS — Z885 Allergy status to narcotic agent status: Secondary | ICD-10-CM

## 2023-09-19 LAB — CBC WITH DIFFERENTIAL/PLATELET
Abs Immature Granulocytes: 0.03 K/uL (ref 0.00–0.07)
Basophils Absolute: 0 K/uL (ref 0.0–0.1)
Basophils Relative: 0 %
Eosinophils Absolute: 0.1 K/uL (ref 0.0–0.5)
Eosinophils Relative: 1 %
HCT: 37.9 % — ABNORMAL LOW (ref 39.0–52.0)
Hemoglobin: 12.1 g/dL — ABNORMAL LOW (ref 13.0–17.0)
Immature Granulocytes: 0 %
Lymphocytes Relative: 5 %
Lymphs Abs: 0.4 K/uL — ABNORMAL LOW (ref 0.7–4.0)
MCH: 32.4 pg (ref 26.0–34.0)
MCHC: 31.9 g/dL (ref 30.0–36.0)
MCV: 101.6 fL — ABNORMAL HIGH (ref 80.0–100.0)
Monocytes Absolute: 0.5 K/uL (ref 0.1–1.0)
Monocytes Relative: 7 %
Neutro Abs: 6.7 K/uL (ref 1.7–7.7)
Neutrophils Relative %: 87 %
Platelets: 98 K/uL — ABNORMAL LOW (ref 150–400)
RBC: 3.73 MIL/uL — ABNORMAL LOW (ref 4.22–5.81)
RDW: 14.8 % (ref 11.5–15.5)
WBC: 7.7 K/uL (ref 4.0–10.5)
nRBC: 0 % (ref 0.0–0.2)

## 2023-09-19 LAB — COMPREHENSIVE METABOLIC PANEL WITH GFR
ALT: 43 U/L (ref 0–44)
AST: 62 U/L — ABNORMAL HIGH (ref 15–41)
Albumin: 3.1 g/dL — ABNORMAL LOW (ref 3.5–5.0)
Alkaline Phosphatase: 235 U/L — ABNORMAL HIGH (ref 38–126)
Anion gap: 16 — ABNORMAL HIGH (ref 5–15)
BUN: 31 mg/dL — ABNORMAL HIGH (ref 8–23)
CO2: 25 mmol/L (ref 22–32)
Calcium: 8.5 mg/dL — ABNORMAL LOW (ref 8.9–10.3)
Chloride: 97 mmol/L — ABNORMAL LOW (ref 98–111)
Creatinine, Ser: 4.87 mg/dL — ABNORMAL HIGH (ref 0.61–1.24)
GFR, Estimated: 11 mL/min — ABNORMAL LOW (ref >60)
Glucose, Bld: 123 mg/dL — ABNORMAL HIGH (ref 70–99)
Potassium: 3.7 mmol/L (ref 3.5–5.1)
Sodium: 138 mmol/L (ref 135–145)
Total Bilirubin: 1.3 mg/dL — ABNORMAL HIGH (ref 0.0–1.2)
Total Protein: 7 g/dL (ref 6.5–8.1)

## 2023-09-19 LAB — I-STAT CHEM 8, ED
BUN: 32 mg/dL — ABNORMAL HIGH (ref 8–23)
Calcium, Ion: 0.94 mmol/L — ABNORMAL LOW (ref 1.15–1.40)
Chloride: 100 mmol/L (ref 98–111)
Creatinine, Ser: 4.7 mg/dL — ABNORMAL HIGH (ref 0.61–1.24)
Glucose, Bld: 117 mg/dL — ABNORMAL HIGH (ref 70–99)
HCT: 38 % — ABNORMAL LOW (ref 39.0–52.0)
Hemoglobin: 12.9 g/dL — ABNORMAL LOW (ref 13.0–17.0)
Potassium: 3.7 mmol/L (ref 3.5–5.1)
Sodium: 136 mmol/L (ref 135–145)
TCO2: 25 mmol/L (ref 22–32)

## 2023-09-19 LAB — TROPONIN I (HIGH SENSITIVITY): Troponin I (High Sensitivity): 61 ng/L — ABNORMAL HIGH (ref <18)

## 2023-09-19 LAB — AMMONIA: Ammonia: 28 umol/L (ref 9–35)

## 2023-09-19 LAB — CBG MONITORING, ED: Glucose-Capillary: 107 mg/dL — ABNORMAL HIGH (ref 70–99)

## 2023-09-19 MED ORDER — SODIUM CHLORIDE 0.9 % IV BOLUS
125.0000 mL | Freq: Once | INTRAVENOUS | Status: AC
  Administered 2023-09-19: 125 mL via INTRAVENOUS

## 2023-09-19 NOTE — ED Provider Notes (Signed)
 [place] [facility] AT [facility] Provider Note   CSN: [id number] Arrival date & time: 09/19/23  2113     Patient presents with: Ricky Harris is a 83 y.o. male with a past medical history of end-stage renal disease brought in by EMS for unwitnessed fall.  Patient unable to state why he fell.  He does have a history of dementia.  He apparently lives with his son but had an unwitnessed fall to the ground.  Patient completed dialysis today.  EMS reports that he was mildly tachycardic.  He did not lose control of his bowel.  He was complaining initially of right shoulder and wrist pain and was found lying on that side.  He is on Eliquis  for history of aortic valve sclerosis.  He also has a history of CHF and nonalcoholic cirrhosis.  {Add pertinent medical, surgical, social history, OB history to YEP:67052}  Fall       Prior to Admission medications   Medication Sig Start Date End Date Taking? Authorizing Provider  atorvastatin  (LIPITOR ) 40 MG tablet Take 1 tablet (40 mg total) by mouth daily. Patient taking differently: Take 40 mg by mouth at bedtime. 04/28/21   Rojelio Nest, DO  donepezil  (ARICEPT ) 5 MG tablet Take 5 mg by mouth at bedtime. 03/01/22   [provider]  doxycycline (VIBRAMYCIN) 100 MG capsule Take 100 mg by mouth 2 (two) times daily. 11/05/22   [provider]  ELIQUIS  2.5 MG TABS tablet Take 1 tablet by mouth twice daily. 04/06/23   [place], Peter M, MD  levothyroxine  (SYNTHROID ) 50 MCG tablet Take 1 tablet (50 mcg total) by mouth daily. 04/27/21   Rojelio Nest, DO  lidocaine  (LIDODERM ) 5 % Place 1 patch onto the skin daily. Remove & Discard patch within 12 hours or as directed by MD 11/17/22   Nettie, April, MD  midodrine  (PROAMATINE ) 10 MG tablet Take 1 tablet (10 mg total) by mouth daily as needed (takes 3 days a week on dialysis days). 11/07/22   [place], Peter M, MD  multivitamin (RENA-VIT) TABS tablet Take 1 tablet by mouth  daily. Patient taking differently: Take 1 tablet by mouth at bedtime. 09/27/19   Autry-Lott, Rojean, DO  nitroGLYCERIN  (NITROSTAT ) 0.4 MG SL tablet Place 0.4 mg under the tongue every 5 (five) minutes as needed for chest pain.    [provider]  traMADol  (ULTRAM ) 50 MG tablet Take 1 tablet (50 mg total) by mouth every 8 (eight) hours as needed for moderate pain. 03/12/19   Rhyne, Samantha J, PA-C    Allergies: Cephalexin and Hydralazine     Review of Systems  Updated Vital Signs BP 130/78   Physical Exam Vitals and nursing note reviewed.  Constitutional:      General: He is not in acute distress.    Appearance: He is well-developed. He is not diaphoretic.  HENT:     Head: Normocephalic and atraumatic.  Eyes:     General: No scleral icterus.    Conjunctiva/sclera: Conjunctivae normal.  Cardiovascular:     Rate and Rhythm: Regular rhythm. Tachycardia present.     Heart sounds: Murmur heard.     Comments: Fistula noted in the left upper extremity with palpable and audible thrill Pulmonary:     Effort: Pulmonary effort is normal. No respiratory distress.     Breath sounds: Normal breath sounds.  Abdominal:     Palpations: Abdomen is soft.     Tenderness: There is no abdominal  tenderness.  Musculoskeletal:     Cervical back: Normal range of motion and neck supple.     Comments: Patient able to move all extremities without ataxia, no visible  Skin:    General: Skin is warm and dry.  Neurological:     Mental Status: He is alert.     Comments: Patient is alert, at baseline per EMS  Psychiatric:        Behavior: Behavior normal.     (all labs ordered are listed, but only abnormal results are displayed) Labs Reviewed  CBC WITH DIFFERENTIAL/PLATELET  COMPREHENSIVE METABOLIC PANEL WITH GFR  URINALYSIS, ROUTINE W REFLEX MICROSCOPIC  CBG MONITORING, ED  I-STAT CHEM 8, ED    EKG: None  Radiology: No results found.  {Document cardiac monitor, telemetry assessment  procedure when appropriate:32947} Procedures   Medications Ordered in the ED  sodium chloride  0.9 % bolus 125 mL (has no administration in time range)      {Click here for ABCD2, HEART and other calculators REFRESH Note before signing:1}                              Medical Decision Making Amount and/or Complexity of Data Reviewed Labs: ordered. Radiology: ordered.     {Document critical care time when appropriate  Document review of labs and clinical decision tools ie CHADS2VASC2, etc  Document your independent review of radiology images and any outside records  Document your discussion with family members, caretakers and with consultants  Document social determinants of health affecting pt's care  Document your decision making why or why not admission, treatments were needed:32947:::1}   Final diagnoses:  None    ED Discharge Orders     None

## 2023-09-19 NOTE — ED Notes (Signed)
 CCMD contacted to place pt on cardiac monitoring

## 2023-09-19 NOTE — ED Notes (Signed)
 Pt had very large loose bowel movement upon arrival to ED. Peri care completed and new brief placed on pt and bedding changed.

## 2023-09-19 NOTE — ED Triage Notes (Signed)
 Patient BIB ems for evaluation of fall on thinners. Partient reports several falls over last three days. Patient reports right wrist pain. Patient reports hit head no LOC. Patient dialysis patient, last session yesterday. Patient alert during triage. On eliquis .

## 2023-09-20 ENCOUNTER — Emergency Department (HOSPITAL_COMMUNITY)

## 2023-09-20 ENCOUNTER — Observation Stay (HOSPITAL_COMMUNITY)

## 2023-09-20 DIAGNOSIS — W1830XA Fall on same level, unspecified, initial encounter: Secondary | ICD-10-CM

## 2023-09-20 DIAGNOSIS — E038 Other specified hypothyroidism: Secondary | ICD-10-CM

## 2023-09-20 DIAGNOSIS — N186 End stage renal disease: Secondary | ICD-10-CM | POA: Diagnosis not present

## 2023-09-20 DIAGNOSIS — Z992 Dependence on renal dialysis: Secondary | ICD-10-CM | POA: Diagnosis not present

## 2023-09-20 DIAGNOSIS — I4891 Unspecified atrial fibrillation: Secondary | ICD-10-CM | POA: Diagnosis not present

## 2023-09-20 DIAGNOSIS — I9589 Other hypotension: Secondary | ICD-10-CM

## 2023-09-20 DIAGNOSIS — Z7901 Long term (current) use of anticoagulants: Secondary | ICD-10-CM

## 2023-09-20 DIAGNOSIS — L8990 Pressure ulcer of unspecified site, unspecified stage: Secondary | ICD-10-CM | POA: Insufficient documentation

## 2023-09-20 DIAGNOSIS — Z95828 Presence of other vascular implants and grafts: Secondary | ICD-10-CM

## 2023-09-20 DIAGNOSIS — I714 Abdominal aortic aneurysm, without rupture, unspecified: Secondary | ICD-10-CM | POA: Diagnosis not present

## 2023-09-20 LAB — BASIC METABOLIC PANEL WITH GFR
Anion gap: 13 (ref 5–15)
BUN: 37 mg/dL — ABNORMAL HIGH (ref 8–23)
CO2: 27 mmol/L (ref 22–32)
Calcium: 8.3 mg/dL — ABNORMAL LOW (ref 8.9–10.3)
Chloride: 98 mmol/L (ref 98–111)
Creatinine, Ser: 5.47 mg/dL — ABNORMAL HIGH (ref 0.61–1.24)
GFR, Estimated: 10 mL/min — ABNORMAL LOW (ref >60)
Glucose, Bld: 93 mg/dL (ref 70–99)
Potassium: 3.8 mmol/L (ref 3.5–5.1)
Sodium: 138 mmol/L (ref 135–145)

## 2023-09-20 LAB — LACTIC ACID, PLASMA: Lactic Acid, Venous: 1 mmol/L (ref 0.5–1.9)

## 2023-09-20 LAB — CBC
HCT: 31.3 % — ABNORMAL LOW (ref 39.0–52.0)
Hemoglobin: 10.3 g/dL — ABNORMAL LOW (ref 13.0–17.0)
MCH: 33.3 pg (ref 26.0–34.0)
MCHC: 32.9 g/dL (ref 30.0–36.0)
MCV: 101.3 fL — ABNORMAL HIGH (ref 80.0–100.0)
Platelets: 93 K/uL — ABNORMAL LOW (ref 150–400)
RBC: 3.09 MIL/uL — ABNORMAL LOW (ref 4.22–5.81)
RDW: 14.8 % (ref 11.5–15.5)
WBC: 6.8 K/uL (ref 4.0–10.5)
nRBC: 0 % (ref 0.0–0.2)

## 2023-09-20 LAB — VITAMIN B12: Vitamin B-12: 635 pg/mL (ref 180–914)

## 2023-09-20 LAB — TSH: TSH: 2.005 u[IU]/mL (ref 0.350–4.500)

## 2023-09-20 LAB — PHOSPHORUS: Phosphorus: 3.2 mg/dL (ref 2.5–4.6)

## 2023-09-20 LAB — TROPONIN I (HIGH SENSITIVITY): Troponin I (High Sensitivity): 52 ng/L — ABNORMAL HIGH (ref <18)

## 2023-09-20 LAB — MAGNESIUM: Magnesium: 2 mg/dL (ref 1.7–2.4)

## 2023-09-20 LAB — PROCALCITONIN: Procalcitonin: 1.22 ng/mL

## 2023-09-20 LAB — MRSA NEXT GEN BY PCR, NASAL: MRSA by PCR Next Gen: NOT DETECTED

## 2023-09-20 MED ORDER — APIXABAN 2.5 MG PO TABS
2.5000 mg | ORAL_TABLET | Freq: Two times a day (BID) | ORAL | Status: DC
  Administered 2023-09-20: 2.5 mg via ORAL

## 2023-09-20 MED ORDER — ALBUTEROL SULFATE (2.5 MG/3ML) 0.083% IN NEBU: 2.5000 mg | INHALATION_SOLUTION | RESPIRATORY_TRACT | Status: DC | PRN

## 2023-09-20 MED ORDER — ONDANSETRON HCL 4 MG/2ML IJ SOLN: 4.0000 mg | Freq: Four times a day (QID) | INTRAMUSCULAR | Status: DC | PRN

## 2023-09-20 MED ORDER — IOHEXOL 350 MG/ML SOLN
100.0000 mL | Freq: Once | INTRAVENOUS | Status: AC | PRN
  Administered 2023-09-20: 100 mL via INTRAVENOUS

## 2023-09-20 MED ORDER — MELATONIN 3 MG PO TABS: 6.0000 mg | ORAL_TABLET | Freq: Every evening | ORAL | Status: DC | PRN

## 2023-09-20 MED ORDER — QUETIAPINE FUMARATE 25 MG PO TABS
25.0000 mg | ORAL_TABLET | Freq: Every day | ORAL | Status: DC
  Administered 2023-09-20 – 2023-09-25 (×6): 25 mg via ORAL
  Filled 2023-09-20 (×6): qty 1

## 2023-09-20 MED ORDER — ACETAMINOPHEN 500 MG PO TABS: 1000.0000 mg | ORAL_TABLET | Freq: Four times a day (QID) | ORAL | Status: DC | PRN

## 2023-09-20 MED ORDER — DONEPEZIL HCL 10 MG PO TABS
10.0000 mg | ORAL_TABLET | Freq: Every day | ORAL | Status: DC
  Administered 2023-09-20 – 2023-09-25 (×6): 10 mg via ORAL
  Filled 2023-09-20 (×6): qty 1

## 2023-09-20 MED ORDER — LEVOTHYROXINE SODIUM 50 MCG PO TABS
50.0000 ug | ORAL_TABLET | Freq: Every day | ORAL | Status: DC
  Administered 2023-09-20 – 2023-09-26 (×5): 50 ug via ORAL
  Filled 2023-09-20 (×4): qty 1
  Filled 2023-09-20: qty 2

## 2023-09-20 MED ORDER — SODIUM CHLORIDE 0.9% FLUSH
3.0000 mL | Freq: Two times a day (BID) | INTRAVENOUS | Status: DC
  Administered 2023-09-20 – 2023-09-26 (×14): 3 mL via INTRAVENOUS

## 2023-09-20 MED ORDER — POLYETHYLENE GLYCOL 3350 17 G PO PACK
17.0000 g | PACK | Freq: Every day | ORAL | Status: DC | PRN
  Filled 2023-09-20: qty 1

## 2023-09-20 MED ORDER — ORAL CARE MOUTH RINSE
15.0000 mL | OROMUCOSAL | Status: DC | PRN
Start: 2023-09-20 — End: 2023-09-26

## 2023-09-20 MED ORDER — SODIUM CHLORIDE 0.9 % IV SOLN
1.0000 g | Freq: Once | INTRAVENOUS | Status: AC
  Administered 2023-09-20: 1 g via INTRAVENOUS
  Filled 2023-09-20: qty 10

## 2023-09-20 MED ORDER — MIDODRINE HCL 5 MG PO TABS
10.0000 mg | ORAL_TABLET | ORAL | Status: DC
  Administered 2023-09-23 – 2023-09-26 (×2): 10 mg via ORAL
  Filled 2023-09-20 (×2): qty 2

## 2023-09-20 MED ORDER — SODIUM CHLORIDE 0.9 % IV SOLN
500.0000 mg | Freq: Once | INTRAVENOUS | Status: AC
  Administered 2023-09-20: 500 mg via INTRAVENOUS
  Filled 2023-09-20: qty 5

## 2023-09-20 MED ORDER — CHLORHEXIDINE GLUCONATE CLOTH 2 % EX PADS
6.0000 | MEDICATED_PAD | Freq: Every day | CUTANEOUS | Status: DC
  Administered 2023-09-21 – 2023-09-25 (×5): 6 via TOPICAL

## 2023-09-20 MED ORDER — ATORVASTATIN CALCIUM 40 MG PO TABS
40.0000 mg | ORAL_TABLET | Freq: Every day | ORAL | Status: DC
  Administered 2023-09-20 – 2023-09-25 (×6): 40 mg via ORAL
  Filled 2023-09-20 (×6): qty 1

## 2023-09-20 NOTE — Care Management Obs Status (Signed)
 MEDICARE OBSERVATION STATUS NOTIFICATION   Patient Details  Name: Ricky Harris MRN: [id number] Date of Birth: December 22, 1940   Medicare Observation Status Notification Given:  Yes   Spoke to the patient daughter Ricky Harris about the obs status and that a letter will be left in the room or sent to the home address if the patient leaves  Ricky Harris 09/20/2023, 4:40 PM

## 2023-09-20 NOTE — Evaluation (Signed)
 Physical Therapy Evaluation Patient Details Name: Ricky Harris MRN: [id number] DOB: [birth year] Today's Date: 09/20/2023  History of Present Illness  Pt is an 83 y.o. male presenting 7/22 with multiple falls. 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 Pt also with acute MI and enlarging excluded AAA sac. PMHx: dementia, ESRD on TTS HD, pAfib, CAD, HFrEF, cirrhosis, hypothyroidism, anemia and thrombocytopenia, recent orthostatic hypotension, AAA prior endovascular repair   Clinical Impression  Pt in bed upon arrival and agreeable to PT eval. PTA, pt was ModI with rollator for mobility with history of multiple falls. Pt required ModAx2 for bed mobility and CGAx2 for safety to stand from high stretcher. Pt was only able to stand for 1 minute for BP reading before having bilatearl knees buckle with request to return to supine. Pt has intermittent level of assist available at home. Recommending post-acute rehab <3hrs to work towards independence with mobility. Pt would benefit from acute skilled PT with current functional limitations listed below (see PT Problem List). Acute PT to follow.    Orthostatic BP as follows:  Supine: 123/68 HR 66 Seated: 135/63 HR 66 Standing: 107/56 HR 80      If plan is discharge home, recommend the following: A lot of help with walking and/or transfers;A lot of help with bathing/dressing/bathroom;Assistance with cooking/housework;Assist for transportation;Help with stairs or ramp for entrance   Can travel by private vehicle   No    Equipment Recommendations None recommended by PT     Functional Status Assessment Patient has had a recent decline in their functional status and demonstrates the ability to make significant improvements in function in a reasonable and predictable amount of time.     Precautions / Restrictions Precautions Precautions: Fall Recall of Precautions/Restrictions: Intact Precaution/Restrictions  Comments: orthostatic BP Restrictions Weight Bearing Restrictions Per Provider Order: No      Mobility  Bed Mobility Overal bed mobility: Needs Assistance Bed Mobility: Supine to Sit, Sit to Supine     Supine to sit: Mod assist, +2 for physical assistance, +2 for safety/equipment Sit to supine: Mod assist, +2 for physical assistance, +2 for safety/equipment, Max assist   General bed mobility comments: mod A +2 with cues for sequence and assist for trunk/repositioning at EOB. Mod-max for return to supine with poor sequencing and coordination    Transfers Overall transfer level: Needs assistance Equipment used: Rolling walker (2 wheels) Transfers: Sit to/from Stand Sit to Stand: Contact guard assist, +2 safety/equipment      General transfer comment: up from elevated edge of stretcher bed.    Ambulation/Gait  General Gait Details: deferred     Balance Overall balance assessment: Needs assistance Sitting-balance support: Bilateral upper extremity supported, Feet supported, Single extremity supported Sitting balance-Leahy Scale: Poor Sitting balance - Comments: reliant on UE   Standing balance support: Bilateral upper extremity supported, Single extremity supported, During functional activity Standing balance-Leahy Scale: Poor Standing balance comment: poor standing tolerance, reliant on UE support            Pertinent Vitals/Pain Pain Assessment Pain Assessment: Faces Faces Pain Scale: Hurts a little bit Pain Location: generalized Pain Descriptors / Indicators: Discomfort Pain Intervention(s): Limited activity within patient's tolerance, Monitored during session, Repositioned    Home Living Family/patient expects to be discharged to:: Private residence Living Arrangements: Children (son and daughter in Social worker) Available Help at Discharge: Family;Available PRN/intermittently (per chart review alone 1-2 hours/day) Type of Home: House Home Access: Stairs to  enter Entrance Stairs-Rails: Right;Left  Entrance Stairs-Number of Steps: 6   Home Layout: One level Home Equipment: Agricultural consultant (2 wheels);Shower seat;Rollator (4 wheels) (per chart review 2024 had wheelchair unsure if still does)      Prior Function Prior Level of Function : Independent/Modified Independent      Mobility Comments: mod I with rollator ADLs Comments: independnet in ADL, family does IADL.     Extremity/Trunk Assessment   Upper Extremity Assessment Upper Extremity Assessment: Defer to OT evaluation    Lower Extremity Assessment Lower Extremity Assessment: Generalized weakness       Communication   Communication Communication: No apparent difficulties;Impaired Factors Affecting Communication: Hearing impaired    Cognition Arousal: Alert Behavior During Therapy: WFL for tasks assessed/performed   PT - Cognitive impairments: No family/caregiver present to determine baseline, Sequencing, Problem solving, Safety/Judgement    PT - Cognition Comments: cues for sequencing bed mobility Following commands: Impaired Following commands impaired: Follows one step commands with increased time, Follows multi-step commands inconsistently     Cueing Cueing Techniques: Verbal cues, Tactile cues     General Comments General comments (skin integrity, edema, etc.): see flow sheets via RN input for orthostatics.     PT Assessment Patient needs continued PT services  PT Problem List Decreased strength;Decreased activity tolerance;Decreased balance;Decreased mobility;Decreased knowledge of use of DME;Decreased safety awareness       PT Treatment Interventions DME instruction;Gait training;Therapeutic activities;Functional mobility training;Balance training;Therapeutic exercise;Neuromuscular re-education;Patient/family education    PT Goals (Current goals can be found in the Care Plan section)  Acute Rehab PT Goals Patient Stated Goal: to get stronger PT Goal  Formulation: With patient Time For Goal Achievement: 10/04/23 Potential to Achieve Goals: Good    Frequency Min 2X/week     Co-evaluation   Reason for Co-Treatment: For patient/therapist safety PT goals addressed during session: Mobility/safety with mobility OT goals addressed during session: Strengthening/ROM;ADL's and self-care       AM-PAC PT 6 Clicks Mobility  Outcome Measure Help needed turning from your back to your side while in a flat bed without using bedrails?: A Lot Help needed moving from lying on your back to sitting on the side of a flat bed without using bedrails?: A Lot Help needed moving to and from a bed to a chair (including a wheelchair)?: A Little Help needed standing up from a chair using your arms (e.g., wheelchair or bedside chair)?: A Little Help needed to walk in hospital room?: Total Help needed climbing 3-5 steps with a railing? : Total 6 Click Score: 12    End of Session Equipment Utilized During Treatment: Gait belt Activity Tolerance: Patient tolerated treatment well Patient left: in bed;with call bell/phone within reach Nurse Communication: Mobility status PT Visit Diagnosis: Unsteadiness on feet (R26.81);Other abnormalities of gait and mobility (R26.89);Muscle weakness (generalized) (M62.81);History of falling (Z91.81)    Time: 8967-8951 PT Time Calculation (min) (ACUTE ONLY): 16 min   Charges:   PT Evaluation $PT Eval Low Complexity: 1 Low   PT General Charges $$ ACUTE PT VISIT: 1 Visit        Kate ORN, PT, DPT Secure Chat Preferred  Rehab Office [phone]   Kate BRAVO Wendolyn 09/20/2023, 2:26 PM

## 2023-09-20 NOTE — Plan of Care (Signed)

## 2023-09-20 NOTE — Consult Note (Signed)
 Renal Service Consult Note [facility]  Ricky Harris 09/20/2023 Ricky JONETTA Fret, MD Requesting Physician: Dr. MYRTIS Blanch  Reason for Consult: ESRD patient with syncopal episode HPI: The patient is a 83 y.o. year-old w/ PMH as below who presented to ED last night after falling at home.  Ricky Harris has had several falls over the last few days.  Ricky Harris did hit his head once and is on blood thinners, which is why they came to have them looked at.  In the ED blood pressure and heart rate and respiratory rate were normal, temp 98, 100% sat on room air.  +3.7 BUN 32, creatinine 4.7, calcium  8.5, albumin  3.1, WBC 7K, Hgb 12.9.  Chest x-ray showed no pulmonary edema or vascular congestion.  Other imaging showed an enlarging aneurysmal sac in the abdomen, with history of EVAR.  Vascular surgery has been consulted.  Patient also has A-fib, known CAD and reduced EF.  Patient was admitted, we are asked to see for dialysis.   Pt seen in hospital room, family at the bedside.  Patient is very hard of hearing and did not participate in the conversation.  Per the daughter, the patient gets dialysis on TTS schedule, about 4 hours.  Ricky Harris goes to dialysis in [place].  Ricky Harris has good compliance.   ROS - denies CP, no joint pain, no HA   Past Medical History  Past Medical History:  Diagnosis Date   A-fib (HCC) 09/18/2020   AAA (abdominal aortic aneurysm) (HCC)    5.3cm , magd by vascular Dr Eliza ,   Anemia     slightly   Arthritis    knees   Ascites    Cancer (HCC)    prostate   CHF (congestive heart failure) (HCC)    HFrEF 08/2020   Chronic kidney disease    progressed to ESRD 08/2019   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[facility])    Coronary artery disease    on CT scan   Gout    H/O hypercholesterolemia    Heart murmur    History of falling    recent- see ct chest results of 04/25/2018   Hypertension    Hypothyroidism    Liver cirrhosis (HCC)    Myocardial infarction (HCC)    NSTEMI  04/25/21, treated medically   Prediabetes    pt denies    Renal artery stenosis (HCC)    Short-term memory loss    Thrombocytopenia (HCC)    Wears glasses    Past Surgical History  Past Surgical History:  Procedure Laterality Date   ABDOMINAL AORTIC ENDOVASCULAR STENT GRAFT N/A 01/21/2019   Procedure: ABDOMINAL AORTIC ENDOVASCULAR STENT GRAFT WITH CO2;  Surgeon: Eliza Lonni RAMAN, MD;  Location: [facility] OR;  Service: Vascular;  Laterality: N/A;   ABDOMINAL AORTOGRAM W/LOWER EXTREMITY Bilateral 12/14/2018   Procedure: ABDOMINAL AORTOGRAM W/LOWER EXTREMITY;  Surgeon: Eliza Lonni RAMAN, MD;  Location: [facility] INVASIVE CV LAB;  Service: Cardiovascular;  Laterality: Bilateral;   AV FISTULA PLACEMENT Left 03/12/2019   Procedure: LEFT ARTERIOVENOUS Arteriovenous FISTULA CREATION.;  Surgeon: Eliza Lonni RAMAN, MD;  Location: [facility] OR;  Service: Vascular;  Laterality: Left;   CATARACT EXTRACTION W/ INTRAOCULAR LENS  IMPLANT, BILATERAL     CHOLECYSTECTOMY     ESOPHAGEAL BANDING N/A 08/11/2017   Procedure: ESOPHAGEAL BANDING;  Surgeon: Elicia Claw, MD;  Location: [facility] ENDOSCOPY;  Service: Gastroenterology;  Laterality: N/A;   ESOPHAGEAL BANDING N/A 10/26/2017   Procedure: ESOPHAGEAL BANDING;  Surgeon: Elicia,  Layla, MD;  Location: [facility] ENDOSCOPY;  Service: Gastroenterology;  Laterality: N/A;   ESOPHAGEAL BANDING N/A 12/19/2017   Procedure: ESOPHAGEAL BANDING;  Surgeon: Elicia Layla, MD;  Location: [facility] ENDOSCOPY;  Service: Gastroenterology;  Laterality: N/A;   ESOPHAGOGASTRODUODENOSCOPY (EGD) WITH PROPOFOL  N/A 07/05/2017   Procedure: ESOPHAGOGASTRODUODENOSCOPY (EGD) WITH PROPOFOL ;  Surgeon: Elicia Layla, MD;  Location: [facility] ENDOSCOPY;  Service: Gastroenterology;  Laterality: N/A;   ESOPHAGOGASTRODUODENOSCOPY (EGD) WITH PROPOFOL  N/A 08/11/2017   Procedure: ESOPHAGOGASTRODUODENOSCOPY (EGD) WITH PROPOFOL ;  Surgeon: Elicia Layla, MD;  Location: [facility] ENDOSCOPY;  Service: Gastroenterology;   Laterality: N/A;   ESOPHAGOGASTRODUODENOSCOPY (EGD) WITH PROPOFOL  N/A 10/26/2017   Procedure: ESOPHAGOGASTRODUODENOSCOPY (EGD) WITH PROPOFOL ;  Surgeon: Elicia Layla, MD;  Location: [facility] ENDOSCOPY;  Service: Gastroenterology;  Laterality: N/A;   ESOPHAGOGASTRODUODENOSCOPY (EGD) WITH PROPOFOL  N/A 12/19/2017   Procedure: ESOPHAGOGASTRODUODENOSCOPY (EGD) WITH PROPOFOL ;  Surgeon: Elicia Layla, MD;  Location: [facility] ENDOSCOPY;  Service: Gastroenterology;  Laterality: N/A;   ESOPHAGOGASTRODUODENOSCOPY (EGD) WITH PROPOFOL  N/A 10/29/2018   Procedure: ESOPHAGOGASTRODUODENOSCOPY (EGD) WITH PROPOFOL ;  Surgeon: Elicia Layla, MD;  Location: [facility]COPY;  Service: Gastroenterology;  Laterality: N/A;   ESOPHAGOGASTRODUODENOSCOPY (EGD) WITH PROPOFOL  N/A 02/14/2020   Procedure: ESOPHAGOGASTRODUODENOSCOPY (EGD) WITH PROPOFOL ;  Surgeon: Elicia Layla, MD;  Location: [facility] ENDOSCOPY;  Service: Gastroenterology;  Laterality: N/A;   EYE SURGERY     bilateral cataract removal with lens placement   HERNIA REPAIR     IR ANGIOGRAM PULMONARY BILATERAL SELECTIVE  12/03/2021   IR ANGIOGRAM SELECTIVE EACH ADDITIONAL VESSEL  12/03/2021   IR ANGIOGRAM VISCERAL SELECTIVE  12/03/2021   IR ANGIOGRAM VISCERAL SELECTIVE  12/03/2021   IR ANGIOGRAM VISCERAL SELECTIVE  12/03/2021   IR CT SPINE LTD  12/03/2021   IR PARACENTESIS  12/26/2018   IR PARACENTESIS  01/02/2019   IR PARACENTESIS  01/15/2019   IR PARACENTESIS  01/30/2019   IR PARACENTESIS  02/05/2019   IR PARACENTESIS  02/14/2019   IR PARACENTESIS  02/28/2019   IR PARACENTESIS  03/14/2019   IR PARACENTESIS  03/29/2019   IR PARACENTESIS  04/11/2019   IR PARACENTESIS  04/25/2019   IR PARACENTESIS  05/09/2019   IR PARACENTESIS  06/03/2019   IR PARACENTESIS  06/06/2019   IR PARACENTESIS  06/20/2019   IR PARACENTESIS  07/03/2019   IR PARACENTESIS  07/19/2019   IR PARACENTESIS  08/08/2019   IR PARACENTESIS  08/27/2019   IR PARACENTESIS  09/09/2019   IR PARACENTESIS  09/24/2019   IR  PARACENTESIS  10/07/2019   IR PARACENTESIS  10/23/2019   IR PARACENTESIS  11/07/2019   IR PARACENTESIS  11/21/2019   IR PARACENTESIS  12/04/2019   IR PARACENTESIS  12/25/2019   IR PARACENTESIS  01/10/2020   IR PARACENTESIS  01/27/2020   IR PARACENTESIS  02/19/2020   IR PARACENTESIS  03/27/2020   IR RADIOLOGIST EVAL & MGMT  11/16/2021   IR RADIOLOGIST EVAL & MGMT  08/24/2022   IR TRANSCATH/EMBOLIZ  12/03/2021   IR US  GUIDE VASC ACCESS RIGHT  12/03/2021   PROSTATECTOMY     RADIOLOGY WITH ANESTHESIA N/A 12/03/2021   Procedure: Type II Endoleak;  Surgeon: Alona Corners, DO;  Location: [facility] OR;  Service: Anesthesiology;  Laterality: N/A;   RIGHT AND LEFT HEART CATH N/A 09/18/2020   Procedure: RIGHT AND LEFT HEART CATH;  Surgeon: Burnard Debby LABOR, MD;  Location: [facility] INVASIVE CV LAB;  Service: Cardiovascular;  Laterality: N/A;   subtotal gastrectomy     Family History  Family History  Problem Relation Age of Onset  Diabetes Mellitus II Mother    Stroke Mother    Social History  reports that Ricky Harris has never smoked. Ricky Harris has never used smokeless tobacco. Ricky Harris reports that Ricky Harris does not currently use alcohol. Ricky Harris reports that Ricky Harris does not use drugs. Allergies  Allergies  Allergen Reactions   Cephalexin Hives   Hydralazine  Hives   Home medications Prior to Admission medications   Medication Sig Start Date End Date Taking? Authorizing Provider  atorvastatin  (LIPITOR ) 40 MG tablet Take 1 tablet (40 mg total) by mouth daily. Patient taking differently: Take 40 mg by mouth every evening. 04/28/21  Yes Rojelio Nest, DO  calcium  acetate (PHOSLO ) 667 MG capsule Take 667 mg by mouth in the morning, at noon, and at bedtime. 03/02/23 03/01/24 Yes [provider]  donepezil  (ARICEPT ) 10 MG tablet Take 10 mg by mouth every evening.   Yes [provider]  ELIQUIS  2.5 MG TABS tablet Take 1 tablet by mouth twice daily. 04/06/23  Yes [place], Peter M, MD  levothyroxine  (SYNTHROID ) 50 MCG tablet Take 1 tablet (50 mcg  total) by mouth daily. 04/27/21  Yes Rojelio Nest, DO  midodrine  (PROAMATINE ) 10 MG tablet Take 1 tablet (10 mg total) by mouth daily as needed (takes 3 days a week on dialysis days). Patient taking differently: Take 10 mg by mouth 3 (three) times a week. Tuesdays, Thursdays, Saturdays 11/07/22  Yes [place], Peter M, MD  multivitamin (RENA-VIT) TABS tablet Take 1 tablet by mouth daily. Patient taking differently: Take 1 tablet by mouth every evening. 09/27/19  Yes Autry-Lott, Rojean, DO  nitroGLYCERIN  (NITROSTAT ) 0.4 MG SL tablet Place 0.4 mg under the tongue every 5 (five) minutes as needed for chest pain.   Yes [provider]  QUEtiapine  (SEROQUEL ) 25 MG tablet Take 25 mg by mouth every evening.   Yes [provider]  traMADol  (ULTRAM ) 50 MG tablet Take 1 tablet (50 mg total) by mouth every 8 (eight) hours as needed for moderate pain. Patient taking differently: Take 50 mg by mouth daily as needed for moderate pain (pain score 4-6). 03/12/19  Yes Rhyne, Samantha J, PA-C  doxycycline (VIBRAMYCIN) 100 MG capsule Take 100 mg by mouth 2 (two) times daily. Patient not taking: Reported on 09/20/2023 11/05/22   [provider]     Vitals:   09/20/23 1046 09/20/23 1153 09/20/23 1230 09/20/23 1623  BP: (!) 107/56 112/61 (!) 144/74 137/69  Pulse: 80 84 66 69  Resp:  18 18 18   Temp:  98.1 F (36.7 C) 97.8 F (36.6 C) 98.6 F (37 C)  TempSrc:   Oral Oral  SpO2:  100% 97% 100%   Exam Gen alert, no distress, elderly man in bed No rash, cyanosis or gangrene Sclera anicteric, throat clear  No jvd or bruits Chest clear bilat to bases, no rales/ wheezing RRR no MRG Abd soft ntnd no mass or ascites +bs GU deferred MS no joint effusions or deformity Ext trace LE or UE edema, no other edema Neuro is alert, nf      Home bp meds: Midodrine  10 mg pre-HD TTS Others: Lipitor , PhosLo , Aricept , Eliquis , T4, SL NTG, Seroquel , Ultram    OP HD: [facility] TTS 3h  B350   74.8kg    AVF  Heparin  none Wt gains very small, dry wt being lowered Last HD 7/22, post wt 74.8kg    Assessment/ Plan: Multiple falls: deconditioning, multiple comorbidities. Per pmd.  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 Next HD tomorrow.  BP: chronic hypotension on  midodrine  pre HD TTS. Continue.  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  H/o EVAR: per pmd, VVS HFrEF Cirrhosis of liver CAD: 3V CAD, was not felt to be candidate for CABG or PCI    Myer Fret  MD [facility] 09/20/2023, 4:54 PM  Recent Labs  Lab 09/19/23 2125 09/19/23 2130 09/20/23 0553  HGB 12.1* 12.9* 10.3*  ALBUMIN  3.1*  --   --   CALCIUM  8.5*  --  8.3*  PHOS  --   --  3.2  CREATININE 4.87* 4.70* 5.47*  K 3.7 3.7 3.8   Inpatient medications:  atorvastatin   40 mg Oral QHS   donepezil   10 mg Oral QHS   levothyroxine   50 mcg Oral Q0600   [START ON 09/21/2023] midodrine   10 mg Oral Q T,Th,Sat-1800   QUEtiapine   25 mg Oral QHS   sodium chloride  flush  3 mL Intravenous Q12H    acetaminophen , albuterol , melatonin, ondansetron  (ZOFRAN ) IV, polyethylene glycol

## 2023-09-20 NOTE — Evaluation (Signed)
 Occupational Therapy Evaluation Patient Details Name: Ricky Harris MRN: [id number] DOB: Jan 29, 1941 Today's Date: 09/20/2023   History of Present Illness   Pt is an 83 y.o. male presenting 7/22 with multiple falls. 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 PMH significant for dementia, ESRD on TTS HD, pAfib, CAD, HFrEF, cirrhosis, hypothyroidism, anemia and thrombocytopenia, recent orthostatic hypotension     Clinical Impressions PTA, pt lived with son and reports being independent in ADL; son providing assist with IADL. Upon eval, pt requires mod A+2 for bed mobility, CGA for rise up from high stretcher bed. Fatigues quickly with ability to stand 1 min max. Pt with decreased strength, balance, safety, awareness, and positive orthostatics, symptomatic with dizziness. Will continue to follow acutely. Current recommendation of inpatient rehab <3 hours.   Orthostatic BP as follows:  Supine: 123/68 HR 66 Seated: 135/63 HR 66 Standing: 107/56 HR 80   If plan is discharge home, recommend the following:   A little help with walking and/or transfers;A little help with bathing/dressing/bathroom;Assistance with cooking/housework;Assist for transportation;Help with stairs or ramp for entrance     Functional Status Assessment   Patient has had a recent decline in their functional status and demonstrates the ability to make significant improvements in function in a reasonable and predictable amount of time.     Equipment Recommendations   Other (comment) (defer)     Recommendations for Other Services         Precautions/Restrictions   Precautions Precautions: Fall Recall of Precautions/Restrictions: Intact Restrictions Weight Bearing Restrictions Per Provider Order: No     Mobility Bed Mobility Overal bed mobility: Needs Assistance Bed Mobility: Supine to Sit, Sit to Supine     Supine to sit: Mod assist, +2 for physical assistance, +2 for  safety/equipment Sit to supine: Mod assist, +2 for physical assistance, +2 for safety/equipment, Max assist   General bed mobility comments: mod A +2 with cues for sequence and assist for trunk/repositioning at EOB. Mod-max for return to supine with poor sequencing and coordination    Transfers Overall transfer level: Needs assistance Equipment used: Rolling walker (2 wheels) Transfers: Sit to/from Stand Sit to Stand: Contact guard assist, +2 safety/equipment           General transfer comment: up from elevated edge of stretcher bed.      Balance Overall balance assessment: Needs assistance Sitting-balance support: Bilateral upper extremity supported, Feet supported, Single extremity supported Sitting balance-Leahy Scale: Poor Sitting balance - Comments: reliant on UE   Standing balance support: Bilateral upper extremity supported, Single extremity supported, During functional activity Standing balance-Leahy Scale: Poor Standing balance comment: poor standing tolerance, reliant on UE support                           ADL either performed or assessed with clinical judgement   ADL Overall ADL's : Needs assistance/impaired Eating/Feeding: Set up;Bed level   Grooming: Supervision/safety;Sitting   Upper Body Bathing: Supervision/ safety;Sitting   Lower Body Bathing: Minimal assistance;Sit to/from stand   Upper Body Dressing : Set up;Sitting;Supervision/safety   Lower Body Dressing: Moderate assistance;Sitting/lateral leans   Toilet Transfer: Contact guard assist;+2 for safety/equipment;Rolling walker (2 wheels) Toilet Transfer Details (indicate cue type and reason): STS only this session limited by orthostatics                 Vision Baseline Vision/History: 1 Wears glasses Ability to See in Adequate Light: 0 Adequate Patient Visual  Report: No change from baseline Additional Comments: not formally assessed, pt reports changes in vision with onset  dizziness     Perception         Praxis         Pertinent Vitals/Pain Pain Assessment Pain Assessment: Faces Faces Pain Scale: Hurts a little bit Pain Location: generalized Pain Descriptors / Indicators: Discomfort Pain Intervention(s): Monitored during session     Extremity/Trunk Assessment Upper Extremity Assessment Upper Extremity Assessment: Generalized weakness   Lower Extremity Assessment Lower Extremity Assessment: Defer to PT evaluation       Communication Communication Communication: No apparent difficulties;Impaired Factors Affecting Communication: Hearing impaired   Cognition Arousal: Alert Behavior During Therapy: WFL for tasks assessed/performed Cognition: History of cognitive impairments (dementia)             OT - Cognition Comments: follows commands, is oriented to location, able to provide history consistent with chart review.                 Following commands: Impaired Following commands impaired: Follows one step commands with increased time, Follows multi-step commands inconsistently     Cueing  General Comments   Cueing Techniques: Verbal cues;Tactile cues  see flow sheets via RN input for orthostatics.   Exercises     Shoulder Instructions      Home Living Family/patient expects to be discharged to:: Private residence Living Arrangements: Children (son and daughter in law) Available Help at Discharge: Family;Available PRN/intermittently (per chart review alone 1-2 hours/day) Type of Home: House Home Access: Stairs to enter [employer] of Steps: 6 Entrance Stairs-Rails: Right;Left Home Layout: One level     Bathroom Shower/Tub: Producer, television/film/video: Standard     Home Equipment: Agricultural consultant (2 wheels);Shower seat;Rollator (4 wheels) (per chart review 2024 had wheelchair unsure if still does)          Prior Functioning/Environment Prior Level of Function : Independent/Modified  Independent             Mobility Comments: mod I wity rollator ADLs Comments: independnet in ADL, family does IADL.    OT Problem List: Decreased strength;Decreased activity tolerance;Impaired balance (sitting and/or standing);Cardiopulmonary status limiting activity;Decreased safety awareness;Decreased knowledge of use of DME or AE   OT Treatment/Interventions: Self-care/ADL training;Therapeutic exercise;DME and/or AE instruction;Energy conservation;Therapeutic activities;Patient/family education;Balance training      OT Goals(Current goals can be found in the care plan section)   Acute Rehab OT Goals Patient Stated Goal: get better OT Goal Formulation: With patient Time For Goal Achievement: 10/04/23 Potential to Achieve Goals: Good   OT Frequency:  Min 2X/week    Co-evaluation PT/OT/SLP Co-Evaluation/Treatment: Yes Reason for Co-Treatment: For patient/therapist safety PT goals addressed during session: Mobility/safety with mobility OT goals addressed during session: Strengthening/ROM;ADL's and self-care      AM-PAC OT 6 Clicks Daily Activity     Outcome Measure Help from another person eating meals?: None Help from another person taking care of personal grooming?: A Little Help from another person toileting, which includes using toliet, bedpan, or urinal?: A Lot Help from another person bathing (including washing, rinsing, drying)?: A Lot Help from another person to put on and taking off regular upper body clothing?: A Little Help from another person to put on and taking off regular lower body clothing?: A Lot 6 Click Score: 16   End of Session Equipment Utilized During Treatment: Gait belt;Rolling walker (2 wheels) Nurse Communication: Mobility status  Activity Tolerance: Patient tolerated treatment  well Patient left: in bed;with call bell/phone within reach  OT Visit Diagnosis: Unsteadiness on feet (R26.81);Muscle weakness (generalized) (M62.81)                 Time: 8966-8951 OT Time Calculation (min): 15 min Charges:  OT General Charges $OT Visit: 1 Visit OT Evaluation $OT Eval Low Complexity: 1 Low  Elma JONETTA Lebron FREDERICK, OTR/L [facility] Office: [phone]   Elma JONETTA Lebron 09/20/2023, 12:19 PM

## 2023-09-20 NOTE — ED Notes (Signed)
 Called floor and notified patient coming upstairs.

## 2023-09-20 NOTE — Progress Notes (Signed)
 [facility] PLAN OF CARE NOTE Patient: Ricky Harris FMW:[id number]   PCP: Thurmond Cathlyn LABOR., MD DOB: [birth year]   DOA: 09/19/2023   DOS: 09/20/2023    Patient was admitted by my colleague earlier on 09/20/2023. I have reviewed the H&P as well as assessment and plan and agree with the same. Important changes in the plan are listed below.  Plan of care: Principal Problem:   Ground-level fall Active Problems:   Essential hypertension   HLD (hyperlipidemia)   Hypothyroidism   AAA (abdominal aortic aneurysm) without rupture (HCC)   End-stage renal disease on hemodialysis (HCC)   Cirrhosis of liver (HCC)   H/O peptic ulcer   History of prostate cancer   GERD without esophagitis   Type II endoleak of aortic graft   Pressure injury of skin    No focal deficit at the time of my evaluation. Consulted vascular surgery with regards to increasing in the size of AAA with concern for endoleak. Appreciate assistance. Continues to have some abdominal pain on exam. Also consulted to nephrology for HD. Anticoagulation as well as antiplatelet therapy currently on hold. Level of care: Telemetry Medical Telemetry added given his presentation with syncope and AAA endoleak  Author: Yetta Blanch, MD  [facility] 09/20/2023 5:35 PM   If 7PM-7AM, please contact night-coverage at [email or web address]

## 2023-09-20 NOTE — ED Provider Notes (Signed)
  Assumed care at shift change, see prior note for full H&P.  Briefly, 83 year old male here after a fall.  Per son at bedside, multiple falls recently.  Seems like he is getting weak and cannot hold himself up.  Did have full HD treatment earlier today.  Labs here overall reassuring.  No traumatic findings on imaging.  Trop is elevated above baseline.  CXR with questionable PNA.  Plan:  delta trop and PA/lateral CXR pending.  May need admission.  PA and lateral films with right apical opacity.  Delta trop 52 (flat from original at 61).  Discussed with son, given findings of PNA, frequent falls, will admit for ongoing care.  He is agreeable.  Will start rocephin /azithromycin .    Discussed with Dr. Segars-- will admit for ongoing care.  CRITICAL CARE Performed by: Olam CHRISTELLA [place]   Total critical care time: 35 minutes  Critical care time was exclusive of separately billable procedures and treating other patients.  Critical care was necessary to treat or prevent imminent or life-threatening deterioration.  Critical care was time spent personally by me on the following activities: development of treatment plan with patient and/or surrogate as well as nursing, discussions with consultants, evaluation of patient's response to treatment, examination of patient, obtaining history from patient or surrogate, ordering and performing treatments and interventions, ordering and review of laboratory studies, ordering and review of radiographic studies, pulse oximetry and re-evaluation of patient's condition.    [place] Olam CHRISTELLA, PA-C 09/20/23 0354    Griselda Norris, MD 09/20/23 [phone]

## 2023-09-20 NOTE — ED Notes (Signed)
 Patient transported to CT

## 2023-09-20 NOTE — Consult Note (Addendum)
 Hospital Consult    Reason for Consult: Enlarging abdominal aortic aneurysm Requesting Physician: ED MRN #:  [id number]  History of Present Illness: This is a 83 y.o. male with past medical history significant for end-stage renal disease on hemodialysis, atrial fibrillation on Eliquis , CAD, CHF, cirrhosis, dementia.  He is being seen in consultation for evaluation of enlarging abdominal aortic aneurysm.  He underwent endovascular repair of AAA by Dr. Melvenia in November 2020.  He required thrombin  injection and then coil in position via the IMA by interventional radiology in 2023 due to type II endoleak.  He lives with his son who was not present during exam today.  He reportedly has been falling at home.  He is being admitted for treatment of pneumonia.  He denies any abdominal or back pain.  He denies any rest pain or tissue loss bilateral lower extremities.  He is on Eliquis  for atrial fibrillation.  Past Medical History:  Diagnosis Date   A-fib (HCC) 09/18/2020   AAA (abdominal aortic aneurysm) (HCC)    5.3cm , magd by vascular Dr Eliza ,   Anemia     slightly   Arthritis    knees   Ascites    Cancer (HCC)    prostate   CHF (congestive heart failure) (HCC)    HFrEF 08/2020   Chronic kidney disease    progressed to ESRD 08/2019   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[facility])    Coronary artery disease    on CT scan   Gout    H/O hypercholesterolemia    Heart murmur    History of falling    recent- see ct chest results of 04/25/2018   Hypertension    Hypothyroidism    Liver cirrhosis (HCC)    Myocardial infarction (HCC)    NSTEMI 04/25/21, treated medically   Prediabetes    pt denies    Renal artery stenosis (HCC)    Short-term memory loss    Thrombocytopenia (HCC)    Wears glasses     Past Surgical History:  Procedure Laterality Date   ABDOMINAL AORTIC ENDOVASCULAR STENT GRAFT N/A 01/21/2019   Procedure: ABDOMINAL AORTIC ENDOVASCULAR STENT GRAFT WITH CO2;  Surgeon:  Eliza Lonni RAMAN, MD;  Location: [facility] OR;  Service: Vascular;  Laterality: N/A;   ABDOMINAL AORTOGRAM W/LOWER EXTREMITY Bilateral 12/14/2018   Procedure: ABDOMINAL AORTOGRAM W/LOWER EXTREMITY;  Surgeon: Eliza Lonni RAMAN, MD;  Location: [facility] INVASIVE CV LAB;  Service: Cardiovascular;  Laterality: Bilateral;   AV FISTULA PLACEMENT Left 03/12/2019   Procedure: LEFT ARTERIOVENOUS Arteriovenous FISTULA CREATION.;  Surgeon: Eliza Lonni RAMAN, MD;  Location: [facility] OR;  Service: Vascular;  Laterality: Left;   CATARACT EXTRACTION W/ INTRAOCULAR LENS  IMPLANT, BILATERAL     CHOLECYSTECTOMY     ESOPHAGEAL BANDING N/A 08/11/2017   Procedure: ESOPHAGEAL BANDING;  Surgeon: Elicia Claw, MD;  Location: [facility] ENDOSCOPY;  Service: Gastroenterology;  Laterality: N/A;   ESOPHAGEAL BANDING N/A 10/26/2017   Procedure: ESOPHAGEAL BANDING;  Surgeon: Elicia Claw, MD;  Location: [facility] ENDOSCOPY;  Service: Gastroenterology;  Laterality: N/A;   ESOPHAGEAL BANDING N/A 12/19/2017   Procedure: ESOPHAGEAL BANDING;  Surgeon: Elicia Claw, MD;  Location: [facility] ENDOSCOPY;  Service: Gastroenterology;  Laterality: N/A;   ESOPHAGOGASTRODUODENOSCOPY (EGD) WITH PROPOFOL  N/A 07/05/2017   Procedure: ESOPHAGOGASTRODUODENOSCOPY (EGD) WITH PROPOFOL ;  Surgeon: Elicia Claw, MD;  Location: [facility];  Service: Gastroenterology;  Laterality: N/A;   ESOPHAGOGASTRODUODENOSCOPY (EGD) WITH PROPOFOL  N/A 08/11/2017   Procedure: ESOPHAGOGASTRODUODENOSCOPY (EGD) WITH PROPOFOL ;  Surgeon: Elicia,  Layla, MD;  Location: [facility];  Service: Gastroenterology;  Laterality: N/A;   ESOPHAGOGASTRODUODENOSCOPY (EGD) WITH PROPOFOL  N/A 10/26/2017   Procedure: ESOPHAGOGASTRODUODENOSCOPY (EGD) WITH PROPOFOL ;  Surgeon: Elicia Layla, MD;  Location: [facility];  Service: Gastroenterology;  Laterality: N/A;   ESOPHAGOGASTRODUODENOSCOPY (EGD) WITH PROPOFOL  N/A 12/19/2017   Procedure: ESOPHAGOGASTRODUODENOSCOPY (EGD) WITH PROPOFOL ;   Surgeon: Elicia Layla, MD;  Location: [facility];  Service: Gastroenterology;  Laterality: N/A;   ESOPHAGOGASTRODUODENOSCOPY (EGD) WITH PROPOFOL  N/A 10/29/2018   Procedure: ESOPHAGOGASTRODUODENOSCOPY (EGD) WITH PROPOFOL ;  Surgeon: Elicia Layla, MD;  Location: [facility];  Service: Gastroenterology;  Laterality: N/A;   ESOPHAGOGASTRODUODENOSCOPY (EGD) WITH PROPOFOL  N/A 02/14/2020   Procedure: ESOPHAGOGASTRODUODENOSCOPY (EGD) WITH PROPOFOL ;  Surgeon: Elicia Layla, MD;  Location: [facility];  Service: Gastroenterology;  Laterality: N/A;   EYE SURGERY     bilateral cataract removal with lens placement   HERNIA REPAIR     IR ANGIOGRAM PULMONARY BILATERAL SELECTIVE  12/03/2021   IR ANGIOGRAM SELECTIVE EACH ADDITIONAL VESSEL  12/03/2021   IR ANGIOGRAM VISCERAL SELECTIVE  12/03/2021   IR ANGIOGRAM VISCERAL SELECTIVE  12/03/2021   IR ANGIOGRAM VISCERAL SELECTIVE  12/03/2021   IR CT SPINE LTD  12/03/2021   IR PARACENTESIS  12/26/2018   IR PARACENTESIS  01/02/2019   IR PARACENTESIS  01/15/2019   IR PARACENTESIS  01/30/2019   IR PARACENTESIS  02/05/2019   IR PARACENTESIS  02/14/2019   IR PARACENTESIS  02/28/2019   IR PARACENTESIS  03/14/2019   IR PARACENTESIS  03/29/2019   IR PARACENTESIS  04/11/2019   IR PARACENTESIS  04/25/2019   IR PARACENTESIS  05/09/2019   IR PARACENTESIS  06/03/2019   IR PARACENTESIS  06/06/2019   IR PARACENTESIS  06/20/2019   IR PARACENTESIS  07/03/2019   IR PARACENTESIS  07/19/2019   IR PARACENTESIS  08/08/2019   IR PARACENTESIS  08/27/2019   IR PARACENTESIS  09/09/2019   IR PARACENTESIS  09/24/2019   IR PARACENTESIS  10/07/2019   IR PARACENTESIS  10/23/2019   IR PARACENTESIS  11/07/2019   IR PARACENTESIS  11/21/2019   IR PARACENTESIS  12/04/2019   IR PARACENTESIS  12/25/2019   IR PARACENTESIS  01/10/2020   IR PARACENTESIS  01/27/2020   IR PARACENTESIS  02/19/2020   IR PARACENTESIS  03/27/2020   IR RADIOLOGIST EVAL & MGMT  11/16/2021   IR RADIOLOGIST EVAL & MGMT  08/24/2022    IR TRANSCATH/EMBOLIZ  12/03/2021   IR US  GUIDE VASC ACCESS RIGHT  12/03/2021   PROSTATECTOMY     RADIOLOGY WITH ANESTHESIA N/A 12/03/2021   Procedure: Type II Endoleak;  Surgeon: Alona Corners, DO;  Location: [facility] OR;  Service: Anesthesiology;  Laterality: N/A;   RIGHT AND LEFT HEART CATH N/A 09/18/2020   Procedure: RIGHT AND LEFT HEART CATH;  Surgeon: Burnard Debby LABOR, MD;  Location: [facility] INVASIVE CV LAB;  Service: Cardiovascular;  Laterality: N/A;   subtotal gastrectomy      Allergies  Allergen Reactions   Cephalexin Hives   Hydralazine  Hives    Prior to Admission medications   Medication Sig Start Date End Date Taking? Authorizing Provider  atorvastatin  (LIPITOR ) 40 MG tablet Take 1 tablet (40 mg total) by mouth daily. Patient taking differently: Take 40 mg by mouth at bedtime. 04/28/21   Rojelio Nest, DO  donepezil  (ARICEPT ) 5 MG tablet Take 5 mg by mouth at bedtime. 03/01/22   [provider]  doxycycline (VIBRAMYCIN) 100 MG capsule Take 100 mg by mouth 2 (two) times daily. 11/05/22  [provider]  ELIQUIS  2.5 MG TABS tablet Take 1 tablet by mouth twice daily. 04/06/23   [place], Peter M, MD  levothyroxine  (SYNTHROID ) 50 MCG tablet Take 1 tablet (50 mcg total) by mouth daily. 04/27/21   Rojelio Nest, DO  lidocaine  (LIDODERM ) 5 % Place 1 patch onto the skin daily. Remove & Discard patch within 12 hours or as directed by MD 11/17/22   Palumbo, April, MD  midodrine  (PROAMATINE ) 10 MG tablet Take 1 tablet (10 mg total) by mouth daily as needed (takes 3 days a week on dialysis days). 11/07/22   [place], Peter M, MD  multivitamin (RENA-VIT) TABS tablet Take 1 tablet by mouth daily. Patient taking differently: Take 1 tablet by mouth at bedtime. 09/27/19   Autry-Lott, Rojean, DO  nitroGLYCERIN  (NITROSTAT ) 0.4 MG SL tablet Place 0.4 mg under the tongue every 5 (five) minutes as needed for chest pain.    [provider]  traMADol  (ULTRAM ) 50 MG tablet Take 1 tablet (50 mg total)  by mouth every 8 (eight) hours as needed for moderate pain. 03/12/19   Rhyne, Lucie PARAS, PA-C    Social History   Socioeconomic History   Marital status: Divorced    Spouse name: Not on file   Number of children: Not on file   Years of education: Not on file   Highest education level: Not on file  Occupational History   Not on file  Tobacco Use   Smoking status: Never   Smokeless tobacco: Never  Vaping Use   Vaping status: Never Used  Substance and Sexual Activity   Alcohol use: Not Currently   Drug use: Never   Sexual activity: Not on file  Other Topics Concern   Not on file  Social History Narrative   Not on file   Social Drivers of Health   Financial Resource Strain: Not on file  Food Insecurity: Low Risk  (06/20/2023)   Received from [facility]   Hunger Vital Sign    Within the past 12 months, you worried that your food would run out before you got money to buy more: Never true    Within the past 12 months, the food you bought just didn't last and you didn't have money to get more. : Never true  Transportation Needs: No Transportation Needs (06/20/2023)   Received from [facility]    In the past 12 months, has lack of reliable transportation kept you from medical appointments, meetings, work or from getting things needed for daily living? : No  Physical Activity: Not on file  Stress: Not on file  Social Connections: Not on file  Intimate Partner Violence: Not on file     Family History  Problem Relation Age of Onset   Diabetes Mellitus II Mother    Stroke Mother     ROS: Otherwise negative unless mentioned in HPI  Physical Examination  Vitals:   09/20/23 0638 09/20/23 0829  BP:  (!) 118/56  Pulse:  68  Resp:  18  Temp: 98.5 F (36.9 C) 98.1 F (36.7 C)  SpO2:  100%   There is no height or weight on file to calculate BMI.  General:  WDWN in NAD Gait: Not observed HENT: WNL, normocephalic Pulmonary: normal non-labored  breathing, without Rales, rhonchi,  wheezing Cardiac: regular Abdomen: Tenderness left upper quadrant Skin: without rashes Vascular Exam/Pulses: palpable ATA pulses Extremities: without ischemic changes, without Gangrene , without cellulitis; without open wounds;  Musculoskeletal: no muscle  wasting or atrophy  Neurologic: A&O X 3 Psychiatric:  The pt has Normal affect. Lymph:  Unremarkable  CBC    Component Value Date/Time   WBC 6.8 09/20/2023 0553   RBC 3.09 (L) 09/20/2023 0553   HGB 10.3 (L) 09/20/2023 0553   HCT 31.3 (L) 09/20/2023 0553   PLT 93 (L) 09/20/2023 0553   MCV 101.3 (H) 09/20/2023 0553   MCH 33.3 09/20/2023 0553   MCHC 32.9 09/20/2023 0553   RDW 14.8 09/20/2023 0553   LYMPHSABS 0.4 (L) 09/19/2023 2125   MONOABS 0.5 09/19/2023 2125   EOSABS 0.1 09/19/2023 2125   BASOSABS 0.0 09/19/2023 2125    BMET    Component Value Date/Time   NA 138 09/20/2023 0553   K 3.8 09/20/2023 0553   CL 98 09/20/2023 0553   CO2 27 09/20/2023 0553   GLUCOSE 93 09/20/2023 0553   BUN 37 (H) 09/20/2023 0553   CREATININE 5.47 (H) 09/20/2023 0553   CALCIUM  8.3 (L) 09/20/2023 0553   GFRNONAA 10 (L) 09/20/2023 0553   GFRAA 9 (L) 10/31/2019 1843    COAGS: Lab Results  Component Value Date   INR 1.3 (H) 11/04/2021   INR 1.3 (H) 10/23/2021   INR 1.2 09/15/2020     Non-Invasive Vascular Imaging:   CT demonstrating 9.9 cm saccular AAA with prior endovascular repair   ASSESSMENT/PLAN: This is a 83 y.o. male with prior endovascular repair of abdominal aortic aneurysm in 2020.  Presents to the emergency department with recurrent falls and pneumonia.  Mr. Kinzler is an 83 year old male who was brought to the emergency department due to recurrent falls and pneumonia.  He was noted to have a 9.9 cm abdominal aortic aneurysm with prior endovascular repair.  He also underwent coil embolization by IR for a type II endoleak.  He has been lost to follow-up from our standpoint.  On exam he has  some mild tenderness in the left upper quadrant.  Without palpation he denies abdominal pain or back pain.  He will need a formal CTA abdomen pelvis to evaluate for potential ongoing endoleak.  He will also need his Eliquis  held.  On-call vascular surgeon Dr. Magda will follow-up with treatment plans after CTA has resulted.   Donnice Sender PA-C [facility] [phone]  VASCULAR STAFF ADDENDUM: I have independently interviewed and examined the patient. I agree with the above.  Interval growth of AAA sac. IR did angiogram in 2023 after my last evaluation which was unrevealing. Patient then lost to follow up. Interval growth worrisome. Check CT angiogram to evaluate for leak.   Debby SAILOR. Magda, MD [facility] Phone Number: [phone] 09/20/2023 10:04 AM

## 2023-09-20 NOTE — H&P (Addendum)
 History and Physical    Ricky Harris FMW:[id number] DOB: 06-04-1940 DOA: 09/19/2023  PCP: Thurmond Cathlyn LABOR., MD   Patient coming from: Home    Chief Complaint:  Chief Complaint  Patient presents with   Fall    HPI: History limited by patient's dementia Ricky Harris is a 83 y.o. male with hx of suspected dementia, ESRD on TTS HD, paroxysmal A-fib on anticoagulation, coronary artery disease, HFrEF, cirrhosis, hypothyroidism, anemia and thrombocytopenia, recent orthostatic hypotension, brought in after ground-level fall.  Patient is unable to provide any significant history does not remember falls  Per ED collateral  Per son at bedside, multiple falls recently.  Seems like he is getting weak and cannot hold himself up.  Did have full HD treatment earlier today     Review of Systems:  ROS unable to complete due to dementia  Allergies  Allergen Reactions   Cephalexin Hives   Hydralazine  Hives    Prior to Admission medications   Medication Sig Start Date End Date Taking? Authorizing Provider  atorvastatin  (LIPITOR ) 40 MG tablet Take 1 tablet (40 mg total) by mouth daily. Patient taking differently: Take 40 mg by mouth at bedtime. 04/28/21   Rojelio Nest, DO  donepezil  (ARICEPT ) 5 MG tablet Take 5 mg by mouth at bedtime. 03/01/22   [provider]  doxycycline (VIBRAMYCIN) 100 MG capsule Take 100 mg by mouth 2 (two) times daily. 11/05/22   [provider]  ELIQUIS  2.5 MG TABS tablet Take 1 tablet by mouth twice daily. 04/06/23   [place], Peter M, MD  levothyroxine  (SYNTHROID ) 50 MCG tablet Take 1 tablet (50 mcg total) by mouth daily. 04/27/21   Rojelio Nest, DO  lidocaine  (LIDODERM ) 5 % Place 1 patch onto the skin daily. Remove & Discard patch within 12 hours or as directed by MD 11/17/22   Palumbo, April, MD  midodrine  (PROAMATINE ) 10 MG tablet Take 1 tablet (10 mg total) by mouth daily as needed (takes 3 days a week on dialysis days). 11/07/22   [place], Peter M, MD   multivitamin (RENA-VIT) TABS tablet Take 1 tablet by mouth daily. Patient taking differently: Take 1 tablet by mouth at bedtime. 09/27/19   Autry-Lott, Rojean, DO  nitroGLYCERIN  (NITROSTAT ) 0.4 MG SL tablet Place 0.4 mg under the tongue every 5 (five) minutes as needed for chest pain.    [provider]  traMADol  (ULTRAM ) 50 MG tablet Take 1 tablet (50 mg total) by mouth every 8 (eight) hours as needed for moderate pain. 03/12/19   Modesto Lucie PARAS, PA-C    Past Medical History:  Diagnosis Date   A-fib (HCC) 09/18/2020   AAA (abdominal aortic aneurysm) (HCC)    5.3cm , magd by vascular Dr Eliza ,   Anemia     slightly   Arthritis    knees   Ascites    Cancer (HCC)    prostate   CHF (congestive heart failure) (HCC)    HFrEF 08/2020   Chronic kidney disease    progressed to ESRD 08/2019   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[facility])    Coronary artery disease    on CT scan   Gout    H/O hypercholesterolemia    Heart murmur    History of falling    recent- see ct chest results of 04/25/2018   Hypertension    Hypothyroidism    Liver cirrhosis (HCC)    Myocardial infarction (HCC)    NSTEMI 04/25/21, treated medically  Prediabetes    pt denies    Renal artery stenosis (HCC)    Short-term memory loss    Thrombocytopenia (HCC)    Wears glasses     Past Surgical History:  Procedure Laterality Date   ABDOMINAL AORTIC ENDOVASCULAR STENT GRAFT N/A 01/21/2019   Procedure: ABDOMINAL AORTIC ENDOVASCULAR STENT GRAFT WITH CO2;  Surgeon: Eliza Lonni RAMAN, MD;  Location: [facility];  Service: Vascular;  Laterality: N/A;   ABDOMINAL AORTOGRAM W/LOWER EXTREMITY Bilateral 12/14/2018   Procedure: ABDOMINAL AORTOGRAM W/LOWER EXTREMITY;  Surgeon: Eliza Lonni RAMAN, MD;  Location: [facility] INVASIVE CV LAB;  Service: Cardiovascular;  Laterality: Bilateral;   AV FISTULA PLACEMENT Left 03/12/2019   Procedure: LEFT ARTERIOVENOUS Arteriovenous FISTULA CREATION.;  Surgeon: Eliza Lonni RAMAN, MD;  Location: [facility] OR;  Service: Vascular;  Laterality: Left;   CATARACT EXTRACTION W/ INTRAOCULAR LENS  IMPLANT, BILATERAL     CHOLECYSTECTOMY     ESOPHAGEAL BANDING N/A 08/11/2017   Procedure: ESOPHAGEAL BANDING;  Surgeon: Elicia Claw, MD;  Location: [facility];  Service: Gastroenterology;  Laterality: N/A;   ESOPHAGEAL BANDING N/A 10/26/2017   Procedure: ESOPHAGEAL BANDING;  Surgeon: Elicia Claw, MD;  Location: [facility];  Service: Gastroenterology;  Laterality: N/A;   ESOPHAGEAL BANDING N/A 12/19/2017   Procedure: ESOPHAGEAL BANDING;  Surgeon: Elicia Claw, MD;  Location: [facility];  Service: Gastroenterology;  Laterality: N/A;   ESOPHAGOGASTRODUODENOSCOPY (EGD) WITH PROPOFOL  N/A 07/05/2017   Procedure: ESOPHAGOGASTRODUODENOSCOPY (EGD) WITH PROPOFOL ;  Surgeon: Elicia Claw, MD;  Location: [facility];  Service: Gastroenterology;  Laterality: N/A;   ESOPHAGOGASTRODUODENOSCOPY (EGD) WITH PROPOFOL  N/A 08/11/2017   Procedure: ESOPHAGOGASTRODUODENOSCOPY (EGD) WITH PROPOFOL ;  Surgeon: Elicia Claw, MD;  Location: [facility];  Service: Gastroenterology;  Laterality: N/A;   ESOPHAGOGASTRODUODENOSCOPY (EGD) WITH PROPOFOL  N/A 10/26/2017   Procedure: ESOPHAGOGASTRODUODENOSCOPY (EGD) WITH PROPOFOL ;  Surgeon: Elicia Claw, MD;  Location: [facility];  Service: Gastroenterology;  Laterality: N/A;   ESOPHAGOGASTRODUODENOSCOPY (EGD) WITH PROPOFOL  N/A 12/19/2017   Procedure: ESOPHAGOGASTRODUODENOSCOPY (EGD) WITH PROPOFOL ;  Surgeon: Elicia Claw, MD;  Location: [facility];  Service: Gastroenterology;  Laterality: N/A;   ESOPHAGOGASTRODUODENOSCOPY (EGD) WITH PROPOFOL  N/A 10/29/2018   Procedure: ESOPHAGOGASTRODUODENOSCOPY (EGD) WITH PROPOFOL ;  Surgeon: Elicia Claw, MD;  Location: [facility];  Service: Gastroenterology;  Laterality: N/A;   ESOPHAGOGASTRODUODENOSCOPY (EGD) WITH PROPOFOL  N/A 02/14/2020   Procedure: ESOPHAGOGASTRODUODENOSCOPY (EGD)  WITH PROPOFOL ;  Surgeon: Elicia Claw, MD;  Location: [facility];  Service: Gastroenterology;  Laterality: N/A;   EYE SURGERY     bilateral cataract removal with lens placement   HERNIA REPAIR     IR ANGIOGRAM PULMONARY BILATERAL SELECTIVE  12/03/2021   IR ANGIOGRAM SELECTIVE EACH ADDITIONAL VESSEL  12/03/2021   IR ANGIOGRAM VISCERAL SELECTIVE  12/03/2021   IR ANGIOGRAM VISCERAL SELECTIVE  12/03/2021   IR ANGIOGRAM VISCERAL SELECTIVE  12/03/2021   IR CT SPINE LTD  12/03/2021   IR PARACENTESIS  12/26/2018   IR PARACENTESIS  01/02/2019   IR PARACENTESIS  01/15/2019   IR PARACENTESIS  01/30/2019   IR PARACENTESIS  02/05/2019   IR PARACENTESIS  02/14/2019   IR PARACENTESIS  02/28/2019   IR PARACENTESIS  03/14/2019   IR PARACENTESIS  03/29/2019   IR PARACENTESIS  04/11/2019   IR PARACENTESIS  04/25/2019   IR PARACENTESIS  05/09/2019   IR PARACENTESIS  06/03/2019   IR PARACENTESIS  06/06/2019   IR PARACENTESIS  06/20/2019   IR PARACENTESIS  07/03/2019   IR PARACENTESIS  07/19/2019   IR PARACENTESIS  08/08/2019   IR PARACENTESIS  08/27/2019   IR PARACENTESIS  09/09/2019   IR PARACENTESIS  09/24/2019   IR PARACENTESIS  10/07/2019   IR PARACENTESIS  10/23/2019   IR PARACENTESIS  11/07/2019   IR PARACENTESIS  11/21/2019   IR PARACENTESIS  12/04/2019   IR PARACENTESIS  12/25/2019   IR PARACENTESIS  01/10/2020   IR PARACENTESIS  01/27/2020   IR PARACENTESIS  02/19/2020   IR PARACENTESIS  03/27/2020   IR RADIOLOGIST EVAL & MGMT  11/16/2021   IR RADIOLOGIST EVAL & MGMT  08/24/2022   IR TRANSCATH/EMBOLIZ  12/03/2021   IR US  GUIDE VASC ACCESS RIGHT  12/03/2021   PROSTATECTOMY     RADIOLOGY WITH ANESTHESIA N/A 12/03/2021   Procedure: Type II Endoleak;  Surgeon: Alona Corners, DO;  Location: [facility] OR;  Service: Anesthesiology;  Laterality: N/A;   RIGHT AND LEFT HEART CATH N/A 09/18/2020   Procedure: RIGHT AND LEFT HEART CATH;  Surgeon: Burnard Debby LABOR, MD;  Location: [facility] INVASIVE CV LAB;  Service: Cardiovascular;   Laterality: N/A;   subtotal gastrectomy       reports that he has never smoked. He has never used smokeless tobacco. He reports that he does not currently use alcohol. He reports that he does not use drugs.  Family History  Problem Relation Age of Onset   Diabetes Mellitus II Mother    Stroke Mother      Physical Exam: Vitals:   09/20/23 0315 09/20/23 0500 09/20/23 0600 09/20/23 0638  BP: 111/61 (!) 123/54 (!) 101/52   Pulse: 76 76 68   Resp:  18 20   Temp:    98.5 F (36.9 C)  TempSrc:    Oral  SpO2: 100% 100% 100%     Gen: Awake, alert, elderly, frail HEENT: Hard of hearing CV: Regular, normal S1, S2, 3/6 harsh RUSB cresc-decrec murmur  Resp: Normal WOB, clear  Abd: Flat, normoactive, nontender MSK: Symmetric, no edema. Ranges extremities without pain  Skin: No rashes or lesions to exposed skin  Neuro: Alert and interactive  Psych: euthymic, appropriate    Data review:   Labs reviewed, notable for:   Cr 4.8/esrd  No acute electrolyte abnormalities Bicarb 25, AG 16 Chronic elevation in liver enzymes, T. bili 1.3, AST 62, alk phos 235 similar to prior High-sensitivity Trop 61 -> 52 Macrocytosis without significant anemia Thrombocytopenia platelet 98   Micro:  Results for orders placed or performed during the hospital encounter of 07/30/22  SARS Coronavirus 2 by RT PCR (hospital order, performed in [facility] hospital lab) *cepheid single result test* Anterior Nasal Swab     Status: None   Collection Time: 07/30/22  4:57 PM   Specimen: Anterior Nasal Swab  Result Value Ref Range Status   SARS Coronavirus 2 by RT PCR NEGATIVE NEGATIVE Final    Comment: Performed at [facility], [address], [place], [place] [address]    Imaging reviewed:  CT CHEST WO CONTRAST Result Date: 09/20/2023 EXAM: CT CHEST WITHOUT CONTRAST 09/20/2023 03:31:23 AM TECHNIQUE: CT of the chest was performed without the administration of intravenous contrast. Multiplanar  reformatted images are provided for review. Automated exposure control, iterative reconstruction, and/or weight based adjustment of the mA/kV was utilized to reduce the radiation dose to as low as reasonably achievable. COMPARISON: CTA chest dated 01/31/2023. CLINICAL HISTORY: R apical opacity. FINDINGS: MEDIASTINUM: Heart and pericardium are unremarkable. The central airways are clear. 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 Severe 3-vessel  coronary atherosclerosis. LYMPH NODES: No mediastinal, hilar or axillary lymphadenopathy. LUNGS AND PLEURA: Eventration of the left hemidiaphragm with associated lingular and left lower lobe opacity, likely atelectasis. Mild dependent atelectasis in the right lower lobe. Trace right pleural fluid. SOFT TISSUES/BONES: Gynecomastia. Degenerative changes of the visualized thoracolumbar spine. UPPER ABDOMEN: Mildly nodular hepatic contour, suggesting cirrhosis. Splenomegaly. Severe bilateral renal cortical atrophy. 2.4 cm simple right renal cyst (Bosniak 1), benign. Aortobiiliac stent with excluded aneurysm measuring at least 9.9 cm (series 2, image 199), incompletely visualized but progressive from the prior, previously 7.9 cm. IMPRESSION: 1. 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 2. No right apical pulmonary nodule. No acute cardiopulmonary abnormality. 3. Aortobiiliac stent with excluded aneurysm measuring at least 9.9 cm, incompletely visualized but progressive from the prior, previously 7.9 cm. Follow up consultation with vascular surgery is suggested. Electronically signed by: Pinkie Pebbles MD 09/20/2023 03:41 AM EDT RP Workstation: [id number]   DG Chest 2 View Result Date: 09/20/2023 EXAM: 2 VIEW(S) XRAY OF THE CHEST 09/20/2023 12:32:22 AM COMPARISON: 09/19/2023 CLINICAL HISTORY: ? pneumonia. right upper lobe airspace  opacity. Recommend; repeat chest x-ray PA and lateral view for further evaluation per radiologist; Fall on thinner yesterday FINDINGS: LUNGS AND PLEURA: Persistent medial right apical opacity obscured on the lateral view. Consider CT chest for further evaluation. No pulmonary edema. No pleural effusion. No pneumothorax. HEART AND MEDIASTINUM: No acute abnormality of the cardiac and mediastinal silhouettes. BONES AND SOFT TISSUES: No acute osseous abnormality. IMPRESSION: 1. Persistent medial right apical opacity. 2. Consider CT chest for further evaluation. Electronically signed by: Pinkie Pebbles MD 09/20/2023 12:40 AM EDT RP Workstation: [id number]   CT HEAD WO CONTRAST Result Date: 09/19/2023 CLINICAL DATA:  Status post fall, on anticoagulation. EXAM: CT HEAD WITHOUT CONTRAST TECHNIQUE: Contiguous axial images were obtained from the base of the skull through the vertex without intravenous contrast. RADIATION DOSE REDUCTION: This exam was performed according to the departmental dose-optimization program which includes automated exposure control, adjustment of the mA and/or kV according to patient size and/or use of iterative reconstruction technique. COMPARISON:  07/30/2022 FINDINGS: Brain: No intracranial hemorrhage, mass effect, or midline shift. Age related atrophy. No hydrocephalus. The basilar cisterns are patent. No evidence of territorial infarct or acute ischemia. Stable periventricular and deep white matter hypodensity typical of chronic small vessel ischemia. No extra-axial or intracranial fluid collection. Vascular: Atherosclerosis of skullbase vasculature without hyperdense vessel or abnormal calcification. Skull: No fracture or focal lesion. Sinuses/Orbits: No acute finding. Other: No confluent scalp hematoma. IMPRESSION: 1. No acute intracranial abnormality. No skull fracture. 2. Age related atrophy and chronic small vessel ischemia. Electronically Signed   By: Andrea Gasman M.D.   On:  09/19/2023 22:12   CT CERVICAL SPINE WO CONTRAST Result Date: 09/19/2023 CLINICAL DATA:  Blunt trauma, status post fall.  On anticoagulation. EXAM: CT CERVICAL SPINE WITHOUT CONTRAST TECHNIQUE: Multidetector CT imaging of the cervical spine was performed without intravenous contrast. Multiplanar CT image reconstructions were also generated. RADIATION DOSE REDUCTION: This exam was performed according to the departmental dose-optimization program which includes automated exposure control, adjustment of the mA and/or kV according to patient size and/or use of iterative reconstruction technique. COMPARISON:  Cervical spine CT 08/13/2021 FINDINGS: Alignment: No traumatic subluxation. Mild levo scoliotic curvature. Grade 1 anterolisthesis of C3 on C4, C4 on C5, and C5 on C6. Skull base and vertebrae: No acute fracture. Mild flattening of C6 vertebra is chronic. The dens and skull base are  intact. Soft tissues and spinal canal: No prevertebral fluid or swelling. No visible canal hematoma. Disc levels: Mild multilevel degenerative disc disease. No spinal canal stenosis. Upper chest: Only minimally included in the field of view. Other: None. IMPRESSION: 1. No acute fracture or subluxation of the cervical spine. 2. Mild multilevel degenerative disc disease. Electronically Signed   By: Andrea Gasman M.D.   On: 09/19/2023 22:08   DG Pelvis Portable Result Date: 09/19/2023 CLINICAL DATA:  Trauma EXAM: PORTABLE PELVIS 1-2 VIEWS COMPARISON:  None Available. FINDINGS: No acute displaced fracture or dislocation of either hips. There is no evidence of pelvic fracture or diastasis. No pelvic bone lesions are seen. Penile implant noted. Aorto bi-iliac stent graft noted. Vascular calcifications. IMPRESSION: Negative for acute traumatic injury. Electronically Signed   By: Morgane  Naveau M.D.   On: 09/19/2023 21:40   DG Chest Port 1 View Result Date: 09/19/2023 CLINICAL DATA:  Trauma level 2 fall on thinners EXAM: PORTABLE  CHEST 1 VIEW COMPARISON:  Chest x-ray 07/30/2022 FINDINGS: The heart and mediastinal contours are unchanged. Atherosclerotic plaque. Elevated left hemidiaphragm with left base atelectasis. Question developing right upper lobe airspace opacity. No pulmonary edema. No pleural effusion. No pneumothorax. No acute osseous abnormality. IMPRESSION: 1. Question developing right upper lobe airspace opacity. Recommend repeat chest x-ray PA and lateral view for further evaluation. 2.  Aortic Atherosclerosis (ICD10-I70.0). Electronically Signed   By: Morgane  Naveau M.D.   On: 09/19/2023 21:39    EKG:  Personally reviewed, sinus rhythm, LBBB, no overt ischemic changes  ED Course:  Treated with 125 mL NS and ceftriaxone  and azithromycin  for possible pneumonia   Assessment/Plan:  83 y.o. male with hx suspected dementia, ESRD on TTS HD, paroxysmal A-fib on anticoagulation, coronary artery disease, HFrEF, cirrhosis, hypothyroidism, anemia and thrombocytopenia, recent orthostatic hypotension, brought in after recurrent ground-level fall without associated injuries.   Recurrent GLF  Nature of fall unclear, unwitnessed. Appears weaker per son, may be deconditioning. Possibly may have orthostasis as this was previous problem. Thankfully imaging with CT head, C spine, XR Pelvis, CXR without any acute traumatic injuries.  -Orthostatics -PT/OT evaluation  Enlarging excluded abdominal aortic aneurysm sac, up to 9.9 cm  Hx of EVAR, with known suspected type II endoleak, hx IR transarterial embolization in 10/'23.  Enlarging excluded aneurysm sac. Last imaging 12/'24 was 7.9 cm -> now 9.9 cm on CT today  -Vascular surgery consultation, paged out   Acute myocardial injury  No hx chest pain. EKG with no acute ischemic change. HS trop 61 -> 52. ? Demand with unclear trigger, ? Missed hypotension with his recurrent falls.  -Hold off on aspirin , no change in meds for CAD per below  Abnl CXR  Chest x-ray with a right  apical opacity, followed by CT chest which shows an abberrant subclavian a on the R possibly explaining findings. No pneumonia  -Stop antibiotics, no evidence of pneumonia  Chronic medical problems:  Dementia: Continue home donepezil , Seroquel  at bedtime ESRD on TTS HD: Routine nephrology consult in a.m. next due Thursday Paroxysmal A-fib: On Eliquis  for anticoagulation; hold in setting of enlarging aortic aneurysm sac CAD, known severe three-vessel disease, not felt to be a good candidate for CABG or PCI. On DOAC per above, currently held.  Continue home atorvastatin  HFrEF: Without acute exacerbation. Intolerant of GDMT due to hypotension  Hx cirrhosis, chronic liver injury stable: appears stable. Ammonia wnl  Hypothyroidism: Continue home Levothyroxine .   There is no height or weight on file to  calculate BMI.    DVT prophylaxis:  SCDs Code Status:  DNR/DNI(Do NOT Intubate) Diet:  Diet Orders (From admission, onward)     Start     Ordered   09/20/23 0220  Diet renal with fluid restriction Fluid restriction: 1500 mL Fluid; Room service appropriate? Yes; Fluid consistency: Thin  Diet effective now       Question Answer Comment  Fluid restriction: 1500 mL Fluid   Room service appropriate? Yes   Fluid consistency: Thin      09/20/23 0220           Family Communication:  None   Consults:  Vascular surgery  Admission status:   Observation, Med-Surg  Severity of Illness: The appropriate patient status for this patient is OBSERVATION. Observation status is judged to be reasonable and necessary in order to provide the required intensity of service to ensure the patient's safety. The patient's presenting symptoms, physical exam findings, and initial radiographic and laboratory data in the context of their medical condition is felt to place them at decreased risk for further clinical deterioration. Furthermore, it is anticipated that the patient will be medically stable for discharge from  the hospital within 2 midnights of admission.    Dorn Dawson, MD [facility]  How to contact the [facility] Attending or Consulting provider 7A - 7P or covering provider during after hours 7P -7A, for this patient.  Check the care team in [facility] and look for a) attending/consulting [facility] provider listed and b) the [facility] team listed Log into [email or web address] and use [facility] universal password to access. If you do not have the password, please contact the hospital operator. Locate the [facility] provider you are looking for under [facility] and page to a number that you can be directly reached. If you still have difficulty reaching the provider, please page the [facility] (Director on Call) for the Hospitalists listed on amion for assistance.  09/20/2023, 6:39 AM

## 2023-09-20 NOTE — Progress Notes (Signed)
 Transition of Care [facility]) - CAGE-AID Screening   Patient Details  Name: Ricky Harris MRN: [id number] Date of Birth: June 27, 1940  [facility] CM/SW Contact:    Bernardino Mayotte, RN Phone Number: 09/20/2023, 6:14 AM   Clinical Narrative:  Patient denies use of alcohol and illicit substances. Resources not given at this time.  CAGE-AID Screening:    Have You Ever Felt You Ought to Cut Down on Your Drinking or Drug Use?: No Have People Annoyed You By Critizing Your Drinking Or Drug Use?: No Have You Felt Bad Or Guilty About Your Drinking Or Drug Use?: No Have You Ever Had a Drink or Used Drugs First Thing In The Morning to Steady Your Nerves or to Get Rid of a Hangover?: No CAGE-AID Score: 0  Substance Abuse Education Offered: No

## 2023-09-20 NOTE — ED Notes (Signed)
 Returned from CT.

## 2023-09-21 DIAGNOSIS — I5A Non-ischemic myocardial injury (non-traumatic): Secondary | ICD-10-CM | POA: Diagnosis present

## 2023-09-21 DIAGNOSIS — E7800 Pure hypercholesterolemia, unspecified: Secondary | ICD-10-CM | POA: Diagnosis present

## 2023-09-21 DIAGNOSIS — I132 Hypertensive heart and chronic kidney disease with heart failure and with stage 5 chronic kidney disease, or end stage renal disease: Secondary | ICD-10-CM | POA: Diagnosis present

## 2023-09-21 DIAGNOSIS — F0390 Unspecified dementia without behavioral disturbance: Secondary | ICD-10-CM | POA: Diagnosis present

## 2023-09-21 DIAGNOSIS — W1830XA Fall on same level, unspecified, initial encounter: Secondary | ICD-10-CM | POA: Diagnosis not present

## 2023-09-21 DIAGNOSIS — R531 Weakness: Secondary | ICD-10-CM | POA: Diagnosis present

## 2023-09-21 DIAGNOSIS — I714 Abdominal aortic aneurysm, without rupture, unspecified: Secondary | ICD-10-CM | POA: Diagnosis not present

## 2023-09-21 DIAGNOSIS — I447 Left bundle-branch block, unspecified: Secondary | ICD-10-CM | POA: Diagnosis present

## 2023-09-21 DIAGNOSIS — Z95828 Presence of other vascular implants and grafts: Secondary | ICD-10-CM | POA: Diagnosis not present

## 2023-09-21 DIAGNOSIS — Z79890 Hormone replacement therapy (postmenopausal): Secondary | ICD-10-CM | POA: Diagnosis not present

## 2023-09-21 DIAGNOSIS — I9589 Other hypotension: Secondary | ICD-10-CM | POA: Diagnosis not present

## 2023-09-21 DIAGNOSIS — I9789 Other postprocedural complications and disorders of the circulatory system, not elsewhere classified: Secondary | ICD-10-CM | POA: Diagnosis present

## 2023-09-21 DIAGNOSIS — D696 Thrombocytopenia, unspecified: Secondary | ICD-10-CM | POA: Diagnosis present

## 2023-09-21 DIAGNOSIS — Z833 Family history of diabetes mellitus: Secondary | ICD-10-CM | POA: Diagnosis not present

## 2023-09-21 DIAGNOSIS — I5022 Chronic systolic (congestive) heart failure: Secondary | ICD-10-CM | POA: Diagnosis present

## 2023-09-21 DIAGNOSIS — I2510 Atherosclerotic heart disease of native coronary artery without angina pectoris: Secondary | ICD-10-CM | POA: Diagnosis present

## 2023-09-21 DIAGNOSIS — Z881 Allergy status to other antibiotic agents status: Secondary | ICD-10-CM | POA: Diagnosis not present

## 2023-09-21 DIAGNOSIS — E039 Hypothyroidism, unspecified: Secondary | ICD-10-CM | POA: Diagnosis present

## 2023-09-21 DIAGNOSIS — I2489 Other forms of acute ischemic heart disease: Secondary | ICD-10-CM | POA: Diagnosis not present

## 2023-09-21 DIAGNOSIS — I480 Paroxysmal atrial fibrillation: Secondary | ICD-10-CM | POA: Diagnosis present

## 2023-09-21 DIAGNOSIS — N186 End stage renal disease: Secondary | ICD-10-CM | POA: Diagnosis present

## 2023-09-21 DIAGNOSIS — K7460 Unspecified cirrhosis of liver: Secondary | ICD-10-CM | POA: Diagnosis present

## 2023-09-21 DIAGNOSIS — D631 Anemia in chronic kidney disease: Secondary | ICD-10-CM | POA: Diagnosis present

## 2023-09-21 DIAGNOSIS — E038 Other specified hypothyroidism: Secondary | ICD-10-CM | POA: Diagnosis not present

## 2023-09-21 DIAGNOSIS — Z885 Allergy status to narcotic agent status: Secondary | ICD-10-CM | POA: Diagnosis not present

## 2023-09-21 DIAGNOSIS — Y832 Surgical operation with anastomosis, bypass or graft as the cause of abnormal reaction of the patient, or of later complication, without mention of misadventure at the time of the procedure: Secondary | ICD-10-CM | POA: Diagnosis present

## 2023-09-21 DIAGNOSIS — Z7901 Long term (current) use of anticoagulants: Secondary | ICD-10-CM | POA: Diagnosis not present

## 2023-09-21 DIAGNOSIS — Z992 Dependence on renal dialysis: Secondary | ICD-10-CM | POA: Diagnosis not present

## 2023-09-21 DIAGNOSIS — Z79899 Other long term (current) drug therapy: Secondary | ICD-10-CM | POA: Diagnosis not present

## 2023-09-21 DIAGNOSIS — K219 Gastro-esophageal reflux disease without esophagitis: Secondary | ICD-10-CM | POA: Diagnosis present

## 2023-09-21 DIAGNOSIS — R55 Syncope and collapse: Secondary | ICD-10-CM | POA: Diagnosis not present

## 2023-09-21 DIAGNOSIS — Z66 Do not resuscitate: Secondary | ICD-10-CM | POA: Diagnosis present

## 2023-09-21 LAB — CBC WITH DIFFERENTIAL/PLATELET
Abs Immature Granulocytes: 0.03 K/uL (ref 0.00–0.07)
Basophils Absolute: 0 K/uL (ref 0.0–0.1)
Basophils Relative: 1 %
Eosinophils Absolute: 0.2 K/uL (ref 0.0–0.5)
Eosinophils Relative: 3 %
HCT: 30 % — ABNORMAL LOW (ref 39.0–52.0)
Hemoglobin: 9.9 g/dL — ABNORMAL LOW (ref 13.0–17.0)
Immature Granulocytes: 1 %
Lymphocytes Relative: 13 %
Lymphs Abs: 0.7 K/uL (ref 0.7–4.0)
MCH: 31.8 pg (ref 26.0–34.0)
MCHC: 33 g/dL (ref 30.0–36.0)
MCV: 96.5 fL (ref 80.0–100.0)
Monocytes Absolute: 0.5 K/uL (ref 0.1–1.0)
Monocytes Relative: 10 %
Neutro Abs: 3.7 K/uL (ref 1.7–7.7)
Neutrophils Relative %: 72 %
Platelets: 96 K/uL — ABNORMAL LOW (ref 150–400)
RBC: 3.11 MIL/uL — ABNORMAL LOW (ref 4.22–5.81)
RDW: 14.6 % (ref 11.5–15.5)
WBC: 5 K/uL (ref 4.0–10.5)
nRBC: 0 % (ref 0.0–0.2)

## 2023-09-21 LAB — RENAL FUNCTION PANEL
Albumin: 2.6 g/dL — ABNORMAL LOW (ref 3.5–5.0)
Anion gap: 11 (ref 5–15)
BUN: 53 mg/dL — ABNORMAL HIGH (ref 8–23)
CO2: 26 mmol/L (ref 22–32)
Calcium: 8.1 mg/dL — ABNORMAL LOW (ref 8.9–10.3)
Chloride: 98 mmol/L (ref 98–111)
Creatinine, Ser: 7.67 mg/dL — ABNORMAL HIGH (ref 0.61–1.24)
GFR, Estimated: 7 mL/min — ABNORMAL LOW (ref >60)
Glucose, Bld: 95 mg/dL (ref 70–99)
Phosphorus: 4.3 mg/dL (ref 2.5–4.6)
Potassium: 4.1 mmol/L (ref 3.5–5.1)
Sodium: 135 mmol/L (ref 135–145)

## 2023-09-21 LAB — HEPATITIS B SURFACE ANTIGEN: Hepatitis B Surface Ag: NONREACTIVE

## 2023-09-21 LAB — MAGNESIUM: Magnesium: 2 mg/dL (ref 1.7–2.4)

## 2023-09-21 LAB — GLUCOSE, CAPILLARY
Glucose-Capillary: 54 mg/dL — ABNORMAL LOW (ref 70–99)
Glucose-Capillary: 169 mg/dL — ABNORMAL HIGH (ref 70–99)

## 2023-09-21 MED ORDER — NEPRO/CARBSTEADY PO LIQD: 237.0000 mL | ORAL | Status: DC | PRN

## 2023-09-21 MED ORDER — LIDOCAINE-PRILOCAINE 2.5-2.5 % EX CREA: 1.0000 | TOPICAL_CREAM | CUTANEOUS | Status: DC | PRN

## 2023-09-21 MED ORDER — PENTAFLUOROPROP-TETRAFLUOROETH EX AERO
1.0000 | INHALATION_SPRAY | CUTANEOUS | Status: DC | PRN
Start: 2023-09-21 — End: 2023-09-21

## 2023-09-21 MED ORDER — LIDOCAINE HCL (PF) 1 % IJ SOLN: 5.0000 mL | INTRAMUSCULAR | Status: DC | PRN

## 2023-09-21 MED ORDER — ALTEPLASE 2 MG IJ SOLR: 2.0000 mg | Freq: Once | INTRAMUSCULAR | Status: DC | PRN

## 2023-09-21 MED ORDER — ANTICOAGULANT SODIUM CITRATE 4% (200MG/5ML) IV SOLN: 5.0000 mL | Status: DC | PRN

## 2023-09-21 MED ORDER — HEPARIN SODIUM (PORCINE) 1000 UNIT/ML DIALYSIS: 1000.0000 [IU] | INTRAMUSCULAR | Status: DC | PRN

## 2023-09-21 NOTE — Progress Notes (Signed)
 Blood sugar 54. Pt NPO since midnight. Eating [place] tray now with apple juice. Diet was ordered by MD

## 2023-09-21 NOTE — Plan of Care (Signed)

## 2023-09-21 NOTE — Progress Notes (Signed)
 [facility] Progress Note Patient: Ricky Harris FMW:[id number] DOB: Jan 09, 1941 DOA: 09/19/2023  DOS: the patient was seen and examined on 09/21/2023  Brief Hospital Course: Patient with ESRD on HD TTS, dementia, paroxysmal A-fib, CAD, HFrEF, cirrhosis, hypothyroidism, AAA with repair with endoleak present to the hospital with complaints of passing out event with a fall.  Assessment and Plan: Recurrent fall Lysle of fall unclear, unwitnessed. Appears weaker per son, may be deconditioning. Possibly may have orthostasis as this was previous problem. Thankfully imaging with CT head, C spine, XR Pelvis, CXR without any acute traumatic injuries.  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  Enlarging excluded aneurysm sac. Last imaging 12/'24 was 7.9 cm  now 9.9 cm on CT this admission. Vascular surgery was consulted.  Appreciate assistance. IR consulted. Appreciate assistance n.p.o. after midnight for procedure on 7/25.   Elevated troponin. No hx chest pain. EKG with no acute ischemic change.  Troponin elevated in 50s. Most likely demand ischemia. Not a candidate for aggressive anticoagulation given endoleak.   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 No pneumonia  -Stop antibiotics, no evidence of pneumonia   Dementia:  Continue home donepezil , Seroquel  at bedtime  ESRD on TTS HD:  Appreciate nephrology consultation. Monitor   Paroxysmal A-fib:  On Eliquis  for anticoagulation; hold in setting of enlarging aortic aneurysm sac  CAD,  known severe three-vessel disease, not felt to be a good candidate for CABG or PCI. On DOAC per above, currently held.  Continue home atorvastatin   Chronic HFrEF:  Volume management  with HD.  Hx cirrhosis, chronic liver injury stable:  appears stable. Ammonia wnl   Hypothyroidism:  Continue home Levothyroxine .     Subjective: Continues to have abdominal pain.  No nausea no vomiting no fever no chills.  Passing gas but no BM.  Physical Exam: Left lower current abdominal pain with tenderness. Bowel sound present. S1-S2 present.  Aortic systolic murmur. Alert and awake.  Data Reviewed: I have Reviewed nursing notes, Vitals, and Lab results. Since last encounter, pertinent lab results CBC and BMP   . I have ordered test including CBC and BMP  . I have discussed pt's care plan and test results with nephrology and IR  .   Disposition: Status is: Inpatient Remains inpatient appropriate because: Will require IR procedure for endoleak  Place and maintain sequential compression device Start: 09/20/23 0718   Family Communication: No one at bedside Level of care: Telemetry Medical   Vitals:   09/21/23 1319 09/21/23 1346 09/21/23 1346 09/21/23 1715  BP: (!) 145/61 (!) 146/58 (!) 146/58 138/63  Pulse:  (!) 57 79 80  Resp: 18 16 19 18   Temp:  (!) 97.5 F (36.4 C)  97.9 F (36.6 C)  TempSrc:  Oral  Oral  SpO2:  95%  100%  Weight:      Height:         Author: Yetta Blanch, MD 09/21/2023 6:18 PM  Please look on [email or web address] to find out who is on call.

## 2023-09-21 NOTE — Progress Notes (Signed)
 NEW ADMISSION NOTE New Admission Note:   Arrival Method: stetcher Mental Orientation: alert to self and place, not month or situation Telemetry: NSR, hx of a. fib Assessment: Completed Skin: sacral stage 2 IV: NSL Pain: denies Tubes: none Safety Measures: Safety Fall Prevention Plan has been given, discussed and signed Admission: Completed [facility] Orientation: Patient has been orientated to the room, unit and staff.  Family: Alm notified by phone  Orders have been reviewed and implemented. Will continue to monitor the patient. Call light has been placed within reach and bed alarm has been activated.   Merlon Alcorta A Proctor-Gann, RN

## 2023-09-21 NOTE — TOC Progression Note (Signed)
 Transition of Care [facility]) - Progression Note    Patient Details  Name: Ricky Harris MRN: [id number] Date of Birth: 1940-06-07  Transition of Care [facility]) CM/SW Contact  Nekoda Chock SHAUNNA [place], [place] Phone Number: 09/21/2023, 12:19 PM  Clinical Narrative:     CSW called pt's son Redell; left voicemail requesting return call.   1200: Called pt's other son, Dionta Larke, and discussed snf rec. Son states that family is agreeable to SNF w/u though that son, Redell, would be the best family member to talk to. Creed Raddle. Will update Redell to call CSW.   Fl2 completed and bed requests sent in hub.   Expected Discharge Plan: Skilled Nursing Facility Barriers to Discharge: English as a second language teacher, Continued Medical Work up, No SNF bed               Expected Discharge Plan and Services       Living arrangements for the past 2 months: Single Family Home                                       Social Drivers of Health (SDOH) Interventions SDOH Screenings   Food Insecurity: No Food Insecurity (09/20/2023)  Housing: Low Risk  (09/20/2023)  Transportation Needs: No Transportation Needs (09/20/2023)  Utilities: Not At Risk (09/20/2023)  Social Connections: Unknown (09/20/2023)  Tobacco Use: Low Risk  (09/19/2023)    Readmission Risk Interventions     No data to display

## 2023-09-21 NOTE — TOC Progression Note (Signed)
 Transition of Care [facility]) - Progression Note    Patient Details  Name: Ricky Harris MRN: [id number] Date of Birth: 08/29/1940  Transition of Care [facility]) CM/SW Contact  Luann SHAUNNA [place], [place] Phone Number: 09/21/2023, 4:26 PM  Clinical Narrative:     Provided pt's son with SNF bed offers and medicare star ratings. He chose [facility]. They require OPHD to be changed to MWF schedule temporarily. Renal navigator aware and will follow up on this.  SNF auth request submitted in online portal. Ref# [id number] Shara is pending.   Expected Discharge Plan: Skilled Nursing Facility Barriers to Discharge: English as a second language teacher, Continued Medical Work up, No SNF bed               Expected Discharge Plan and Services       Living arrangements for the past 2 months: Single Family Home                                       Social Drivers of Health (SDOH) Interventions SDOH Screenings   Food Insecurity: No Food Insecurity (09/20/2023)  Housing: Low Risk  (09/20/2023)  Transportation Needs: No Transportation Needs (09/20/2023)  Utilities: Not At Risk (09/20/2023)  Social Connections: Unknown (09/20/2023)  Tobacco Use: Low Risk  (09/19/2023)    Readmission Risk Interventions     No data to display

## 2023-09-21 NOTE — Hospital Course (Addendum)
 Patient with ESRD on HD TTS, dementia, paroxysmal A-fib, CAD, HFrEF, cirrhosis, hypothyroidism, AAA with repair with endoleak present to the hospital with complaints of passing out event with a fall.  Assessment and Plan: Recurrent fall Likely deconditioning with orthostasis. CT head, C spine, XR Pelvis, CXR without any acute traumatic injuries.  PT recommending SNF. Predialysis orthostatic vitals are stable.   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  Enlarging excluded aneurysm sac. Last imaging 12/'24 was 7.9 cm  now 9.9 cm on CT this admission. Vascular surgery was consulted.  Appreciate assistance. IR consulted.  Underwent IR guided procedure on 7/25.  Failed attempt at embolization of the endoleak.  Patient will require repeat procedure.  Will monitor in the hospital.   Elevated troponin. No hx chest pain. EKG with no acute ischemic change.  Troponin elevated in 50s. Most likely demand ischemia. Not a candidate for aggressive anticoagulation given endoleak.   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 No pneumonia  Stop antibiotics, no evidence of pneumonia   Dementia:  Continue home donepezil , Seroquel  at bedtime  ESRD on TTS HD:  Appreciate nephrology consultation. Monitor   Paroxysmal A-fib:  On Eliquis  for anticoagulation; hold in setting of enlarging aortic aneurysm sac  CAD known severe three-vessel disease, not felt to be a good candidate for CABG or PCI. On DOAC per above, currently held.  Continue home atorvastatin   Chronic HFrEF Significant aortic systolic murmur Volume management with HD. Will update an echocardiogram given presentation with syncope.  Hx cirrhosis, chronic liver injury stable:  appears stable. Ammonia wnl    Hypothyroidism:  Continue home Levothyroxine .

## 2023-09-21 NOTE — [place] FL2 (Addendum)
   MEDICAID FL2 LEVEL OF CARE FORM     IDENTIFICATION  Patient Name: Ricky Harris Birthdate: May 30, 1940 Sex: male Admission Date (Current Location): 09/19/2023  [facility] and [place] Number:  Producer, television/film/video and Address:  [facility] [place], [address], [place], [place] [address]      Provider Number:    Attending Physician Name and Address:  Tobie Yetta HERO, MD  Relative Name and Phone Number:  Annah Rogue (Son)  [phone] [facility])    Current Level of Care: Hospital Recommended Level of Care:   Prior Approval Number:    Date Approved/Denied:   PASRR Number: [id number]  Discharge Plan: SNF    Current Diagnoses: Patient Active Problem List   Diagnosis Date Noted   Ground-level fall 09/20/2023   Pressure injury of skin 09/20/2023   Acute metabolic encephalopathy 06/25/2022   Hypokalemia/hypomagnesia 06/24/2022   Hypomagnesemia 06/24/2022   Acute encephalopathy 06/24/2022   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   Malnutrition of moderate degree 04/27/2021   NSTEMI (non-ST elevated myocardial infarction) (HCC) 04/25/2021   GERD without esophagitis 04/25/2021   Paroxysmal atrial fibrillation (HCC) 04/25/2021   CAP (community acquired pneumonia) 09/17/2020   Bilateral pleural effusion 09/15/2020   Thoracic aortic aneurysm (HCC) 09/15/2020   Elevated troponin 09/15/2020   Chest pain 09/15/2020   Dependence on renal dialysis (HCC) 07/13/2020   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HCC) 07/13/2020   Pure hypercholesterolemia 07/13/2020   Lumbar radiculopathy 05/22/2020   Hypercalcemia 04/25/2020   Fluid overload, unspecified 04/14/2020   Impaired cognition 02/26/2020   End-stage renal disease on hemodialysis (HCC) 02/18/2020   Dyspnea 02/18/2020   Encounter for immunization 11/19/2019   Allergy, unspecified, initial encounter 09/27/2019   Iron deficiency anemia, unspecified 09/27/2019   Secondary hyperparathyroidism of  renal origin (HCC) 09/27/2019   Unspecified protein-calorie malnutrition (HCC) 09/27/2019   Diarrhea, unspecified 09/26/2019   Pain, unspecified 09/26/2019   Hyperkalemia 09/23/2019   CKD (chronic kidney disease), stage V (HCC) 09/23/2019   Acute kidney insufficiency 09/23/2019   Pruritus 04/15/2019   Senile purpura (HCC) 04/15/2019   Abdominal aortic aneurysm (AAA) (HCC) 01/21/2019   AAA (abdominal aortic aneurysm) without rupture (HCC) 01/21/2019   Trochanteric bursitis of left hip 12/27/2018   Cirrhosis of liver (HCC) 05/29/2017   Ascites 05/20/2017   Normochromic normocytic anemia 05/20/2017   Essential hypertension 05/20/2017   CKD (chronic kidney disease) stage 3, GFR 30-59 ml/min (HCC) 05/20/2017   HLD (hyperlipidemia) 05/20/2017   Hypothyroidism 05/20/2017   Aortic valve sclerosis 02/23/2017   Coronary artery disease involving native coronary artery of native heart without angina pectoris 02/23/2017   Atherosclerosis of abdominal aorta (HCC) 12/02/2016   Left hip pain 06/13/2016   Senile nuclear sclerosis 04/05/2016   Bilateral chronic knee pain 03/11/2016   Thrombocytopenia (HCC) 11/21/2015   H/O peptic ulcer 05/13/2015   History of prostate cancer 05/13/2015   Vitamin B12 deficiency 05/13/2015   Benign neoplasm of colon 04/01/2015   Degeneration of lumbar intervertebral disc 04/01/2015   Erectile dysfunction 04/01/2015   Gout 04/01/2015   Osteoarthritis 04/01/2015   Prediabetes 04/01/2015   Renal artery stenosis (HCC) 04/01/2015    Orientation RESPIRATION BLADDER Height & Weight     Self, Place  Normal Incontinent Weight: 162 lb 11.2 oz (73.8 kg) Height:  6' 1 (185.4 cm)  BEHAVIORAL SYMPTOMS/MOOD NEUROLOGICAL BOWEL NUTRITION STATUS        Diet (see d/c summary)  AMBULATORY STATUS COMMUNICATION OF NEEDS Skin  Extensive Assist Verbally PU Stage and Appropriate Care, Other (Comment) (Pressure injury sacrum left and right stage 2; trauma to right foot)                        Personal Care Assistance Level of Assistance  Bathing, Feeding, Dressing Bathing Assistance: Maximum assistance Feeding assistance: Independent Dressing Assistance: Maximum assistance     Functional Limitations Info  Sight, Hearing, Speech Sight Info: Adequate Hearing Info: Impaired Speech Info: Adequate    SPECIAL CARE FACTORS FREQUENCY  OT (By licensed OT), PT (By licensed PT)     PT Frequency: 5x/week OT Frequency: 5x/week            Contractures Contractures Info: Not present    Additional Factors Info  Allergies, Code Status Code Status Info: DNR-limited Allergies Info: Cephalexin, Hydralazine            Current Medications (09/21/2023):  This is the current hospital active medication list Current Facility-Administered Medications  Medication Dose Route Frequency Provider Last Rate Last Admin   acetaminophen  (TYLENOL ) tablet 1,000 mg  1,000 mg Oral Q6H PRN Segars, Dorn, MD       albuterol  (PROVENTIL ) (2.5 MG/3ML) 0.083% nebulizer solution 2.5 mg  2.5 mg Nebulization Q4H PRN Segars, Dorn, MD       alteplase  (CATHFLO ACTIVASE ) injection 2 mg  2 mg Intracatheter Once PRN Geralynn Charleston, MD       anticoagulant sodium citrate  solution 5 mL  5 mL Intracatheter PRN Geralynn Charleston, MD       atorvastatin  (LIPITOR ) tablet 40 mg  40 mg Oral QHS Segars, Jonathan, MD   40 mg at 09/20/23 2210   Chlorhexidine  Gluconate Cloth 2 % PADS 6 each  6 each Topical Q0600 Geralynn Charleston, MD   6 each at 09/21/23 0553   donepezil  (ARICEPT ) tablet 10 mg  10 mg Oral QHS Segars, Jonathan, MD   10 mg at 09/20/23 2210   feeding supplement (NEPRO CARB STEADY) liquid 237 mL  237 mL Oral PRN Geralynn Charleston, MD       heparin  injection 1,000 Units  1,000 Units Intracatheter PRN Geralynn Charleston, MD       levothyroxine  (SYNTHROID ) tablet 50 mcg  50 mcg Oral Q0600 Segars, Jonathan, MD   50 mcg at 09/20/23 0636   lidocaine  (PF) (XYLOCAINE ) 1 % injection 5 mL  5 mL Intradermal  PRN Geralynn Charleston, MD       lidocaine -prilocaine  (EMLA ) cream 1 Application  1 Application Topical PRN Geralynn Charleston, MD       melatonin tablet 6 mg  6 mg Oral QHS PRN Segars, Dorn, MD       midodrine  (PROAMATINE ) tablet 10 mg  10 mg Oral Q T,Th,Sat-1800 Patel, Pranav M, MD       ondansetron  (ZOFRAN ) injection 4 mg  4 mg Intravenous Q6H PRN Keturah Dorn, MD       Oral care mouth rinse  15 mL Mouth Rinse PRN Tobie Yetta HERO, MD       pentafluoroprop-tetrafluoroeth JUANA) aerosol 1 Application  1 Application Topical PRN Geralynn Charleston, MD       polyethylene glycol (MIRALAX  / GLYCOLAX ) packet 17 g  17 g Oral Daily PRN Segars, Jonathan, MD       QUEtiapine  (SEROQUEL ) tablet 25 mg  25 mg Oral QHS Segars, Jonathan, MD   25 mg at 09/20/23 2210   sodium chloride  flush (NS) 0.9 % injection 3 mL  3 mL Intravenous Q12H Segars,  Dorn, MD   3 mL at 09/20/23 2210     Discharge Medications: Please see discharge summary for a list of discharge medications.  Relevant Imaging Results:  Relevant Lab Results:   Additional Information SSN [id number] HD TTS [facility] 1135 chair time  [employer], LCSW

## 2023-09-21 NOTE — Progress Notes (Addendum)
  Progress Note    09/21/2023 8:12 AM * No surgery found *  Subjective:  no complaints   Vitals:   09/20/23 1900 09/21/23 0426  BP: (!) 127/57 125/77  Pulse: 72 66  Resp: 18 18  Temp: 99.3 F (37.4 C) 98.2 F (36.8 C)  SpO2: 100% 97%   Physical Exam: Lungs:  non labored Extremities:  moving all ext well Abdomen:  tenderness over LUQ Neurologic: A&O  CBC    Component Value Date/Time   WBC 5.0 09/21/2023 0353   RBC 3.11 (L) 09/21/2023 0353   HGB 9.9 (L) 09/21/2023 0353   HCT 30.0 (L) 09/21/2023 0353   PLT 96 (L) 09/21/2023 0353   MCV 96.5 09/21/2023 0353   MCH 31.8 09/21/2023 0353   MCHC 33.0 09/21/2023 0353   RDW 14.6 09/21/2023 0353   LYMPHSABS 0.7 09/21/2023 0353   MONOABS 0.5 09/21/2023 0353   EOSABS 0.2 09/21/2023 0353   BASOSABS 0.0 09/21/2023 0353    BMET    Component Value Date/Time   NA 135 09/21/2023 0353   K 4.1 09/21/2023 0353   CL 98 09/21/2023 0353   CO2 26 09/21/2023 0353   GLUCOSE 95 09/21/2023 0353   BUN 53 (H) 09/21/2023 0353   CREATININE 7.67 (H) 09/21/2023 0353   CALCIUM  8.1 (L) 09/21/2023 0353   GFRNONAA 7 (L) 09/21/2023 0353   GFRAA 9 (L) 10/31/2019 1843    INR    Component Value Date/Time   INR 1.3 (H) 11/04/2021 1432     Intake/Output Summary (Last 24 hours) at 09/21/2023 9187 Last data filed at 09/20/2023 1900 Gross per 24 hour  Intake 300 ml  Output --  Net 300 ml     Assessment/Plan:  83 y.o. male with enlarging aneurysmal sac after EVAR; likely type 2 endoleak  Some tenderness overlying saccular aneurysm in LUQ Patient is npo in preparation for potential endovascular repair of type 2 endoleak with IR today Eliquis  held yesterday and today Vascular available if needed   Donnice Sender, PA-C [facility] [phone] 09/21/2023 8:12 AM  VASCULAR STAFF ADDENDUM: I have independently interviewed and examined the patient. I agree with the above.   Debby SAILOR. Magda, MD [facility] Phone Number: [phone] 09/21/2023 3:56 PM

## 2023-09-21 NOTE — Progress Notes (Signed)
 [facility] Progress Note   Subjective:    Seen and examined patient on HD. Tolerating UFG 1.5L. BP is 140/67. He is comfortable and denies any issues. Followed by [facility]. 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  Objective Vitals:   09/21/23 0957 09/21/23 1000 09/21/23 1035 09/21/23 1105  BP: (!) 140/67 136/68 (!) 141/62 134/66  Pulse: 67 68 67 64  Resp: 17 15 17 16   Temp:      TempSrc:      SpO2: 98% 99% 100% 100%  Weight:      Height:       Physical Exam General: Elderly male; hard of hearing; Awake, NAD Heart: S1 and S2; No murmurs, gallops, or rubs Lungs: Clear anteriorly Abdomen: Soft  Extremities:No LE edema Dialysis Access: AVF   Filed Weights   09/20/23 1800 09/21/23 0802 09/21/23 0946  Weight: 74.5 kg 73.3 kg 73.8 kg    Intake/Output Summary (Last 24 hours) at 09/21/2023 1134 Last data filed at 09/20/2023 1900 Gross per 24 hour  Intake 200 ml  Output --  Net 200 ml    Additional Objective Labs: Basic Metabolic Panel: Recent Labs  Lab 09/19/23 2125 09/19/23 2130 09/20/23 0553 09/21/23 0353  NA 138 136 138 135  K 3.7 3.7 3.8 4.1  CL 97* 100 98 98  CO2 25  --  27 26  GLUCOSE 123* 117* 93 95  BUN 31* 32* 37* 53*  CREATININE 4.87* 4.70* 5.47* 7.67*  CALCIUM  8.5*  --  8.3* 8.1*  PHOS  --   --  3.2 4.3   Liver Function Tests: Recent Labs  Lab 09/19/23 2125 09/21/23 0353  AST 62*  --   ALT 43  --   ALKPHOS 235*  --   BILITOT 1.3*  --   PROT 7.0  --   ALBUMIN  3.1* 2.6*   No results for input(s): LIPASE, AMYLASE in the last 168 hours. CBC: Recent Labs  Lab 09/19/23 2125 09/19/23 2130 09/20/23 0553 09/21/23 0353  WBC 7.7  --  6.8 5.0  NEUTROABS 6.7  --   --  3.7  HGB 12.1* 12.9* 10.3* 9.9*  HCT 37.9* 38.0* 31.3* 30.0*  MCV 101.6*  --  101.3* 96.5  PLT 98*  --  93* 96*   Blood Culture    Component Value Date/Time   SDES BLOOD RIGHT HAND 07/06/2022 1925    SPECREQUEST  07/06/2022 1925    BOTTLES DRAWN AEROBIC ONLY Blood Culture results may not be optimal due to an inadequate volume of blood received in culture bottles   CULT  07/06/2022 1925    NO GROWTH 5 DAYS Performed at [facility], [address] [place], [place] [address]    REPTSTATUS 07/11/2022 FINAL 07/06/2022 1925    Cardiac Enzymes: No results for input(s): CKTOTAL, CKMB, CKMBINDEX, TROPONINI in the last 168 hours. CBG: Recent Labs  Lab 09/19/23 2129  GLUCAP 107*   Iron Studies: No results for input(s): IRON, TIBC, TRANSFERRIN, FERRITIN in the last 72 hours. Lab Results  Component Value Date   INR 1.3 (H) 11/04/2021   INR 1.3 (H) 10/23/2021   INR 1.2 09/15/2020   Studies/Results: CT Angio Chest/Abd/Pel for Dissection W and/or W/WO Result Date: 09/20/2023 EXAM: CTA CHEST, ABDOMEN AND PELVIS WITHOUT AND WITH CONTRAST 09/20/2023 11:09:53 AM TECHNIQUE: CTA of the chest was performed without and with the administration of intravenous contrast. CTA of the abdomen and pelvis was  performed without and with the administration of intravenous contrast. Multiplanar reformatted images are provided for review. MIP images are provided for review. Automated exposure control, iterative reconstruction, and/or weight based adjustment of the mA/kV was utilized to reduce the radiation dose to as low as reasonably achievable. COMPARISON: CTA of 01/31/23 CLINICAL HISTORY: Aortic aneurysm suspected. 100ml omni 350 IV. Patient BIB ems for evaluation of fall on thinners. Partient reports several falls over last three days. Patient reports right wrist pain. Patient reports hit head no LOC. Patient dialysis patient, last session yesterday. Patient alert during triage. On eliquis . FINDINGS: VASCULATURE: AORTA: No intramural hematoma on noncontrast imaging. Thoracic aortic atherosclerosis without aneurysm. 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 The  native sac measures 8.9 x 7.5 cm 161/3 versus 7.9 x 6.7 cm when measured in a similar fashion on the prior. Endoleak identified anteriorly and posteriorly including at 64/13. PULMONARY ARTERIES: No central pulmonary embolism on this non-dedicated exam. Pulmonary artery enlargement, outflow tract 3.5 cm. 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 CELIAC TRUNK: No acute finding. No occlusion or significant stenosis. SUPERIOR MESENTERIC ARTERY: No acute finding. No occlusion or significant stenosis. INFERIOR MESENTERIC ARTERY: No acute finding. No occlusion or significant stenosis. RENAL ARTERIES: Moderate atherosclerosis at the origin of both renal arteries with at least mild left renal artery narrowing. ILIAC ARTERIES: No acute finding. No occlusion or significant stenosis. CHEST: MEDIASTINUM: Mild cardiomegaly, without pericardial effusion. 4-vessel coronary artery calcification. LUNGS AND PLEURA: Similar trace right pleural thickening. Mild left hemidiaphragm elevation. Left base subsegmental atelectasis and/or scarring are not significantly changed. Superior segment left lower lobe subpleural 4 mm calcified granuloma. A 2 mm right upper lobe pulmonary nodule is unchanged and can be presumed benign. No follow up indicated. THORACIC BONES AND SOFT TISSUES: No acute bone or soft tissue abnormality. ABDOMEN AND PELVIS: LIVER: Cirrhosis, as evidenced by an irregular hepatic capsule and moderate caudate lobe enlargement. GALLBLADDER AND BILE DUCTS: Cholecystectomy without biliary duct dilatation. SPLEEN: The spleen is unremarkable. PANCREAS: Fatty atrophy involving the pancreatic head and uncinate process. ADRENAL GLANDS: Bilateral adrenal glands demonstrate no acute abnormality. KIDNEYS, URETERS AND BLADDER: 4 mm lower pole right renal collecting system calculus. A lower pole left renal lesion measures 1.4 cm and is greater than fluid density on image 200 of  series 6. This is likely enlarged compared to 11/04/2021. Other bilateral renal lesions are likely cysts. No hydronephrosis. No perinephric or periureteral stranding. Urinary bladder is unremarkable. GI AND BOWEL: Extensive colonic diverticulosis. Stomach and duodenal sweep demonstrate no acute abnormality. There is no bowel obstruction. No abnormal bowel wall thickening or distension. REPRODUCTIVE: Status post penile pump with bilateral reservoirs  Prostatectomy PERITONEUM AND RETROPERITONEUM: No ascites or free air. LYMPH NODES: No lymphadenopathy. 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 Renal osteodystrophy. Lumbar spondylosis No acute soft tissue abnormality. IMPRESSION: 1. 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 The native sac measures 8.9 x 7.5 cm, increased from 7.9 x 6.7 cm on the prior study. 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 2. Moderate atherosclerosis at the origin of both renal arteries with at least mild left renal artery narrowing. 3. Cirrhosis 4. Lower pole left renal complex lesion measuring 1.4 cm, likely minimally enlarged compared to 11/04/2021. Consider further evaluation with non-emergent outpatient pre- and post-contrast abdominal MRI or renal protocol CT. 5. No posttraumatic deformity identified. Electronically signed by:  Rockey Kilts MD 09/20/2023 11:40 AM EDT RP Workstation: [id number]   DG Wrist Complete Right Result Date: 09/20/2023 CLINICAL DATA:  Pain after fall EXAM: RIGHT WRIST - COMPLETE 3 VIEW COMPARISON:  None Available. FINDINGS: No fracture or dislocation. Osteopenia. Advanced degenerative changes of the first carpometacarpal joint with sclerosis, joint space loss and osteophytes. There is also some degenerative changes of the distal radioulnar joint. Scattered vascular calcifications are identified. If there is persistent pain or further concern  of a scaphoid injury, treatment with follow-up imaging in 7-10 days can be useful to assess for occult abnormality. IMPRESSION: Osteopenia.  Degenerative changes. Electronically Signed   By: Ranell Bring M.D.   On: 09/20/2023 10:05   CT CHEST WO CONTRAST Result Date: 09/20/2023 EXAM: CT CHEST WITHOUT CONTRAST 09/20/2023 03:31:23 AM TECHNIQUE: CT of the chest was performed without the administration of intravenous contrast. Multiplanar reformatted images are provided for review. Automated exposure control, iterative reconstruction, and/or weight based adjustment of the mA/kV was utilized to reduce the radiation dose to as low as reasonably achievable. COMPARISON: CTA chest dated 01/31/2023. CLINICAL HISTORY: R apical opacity. FINDINGS: MEDIASTINUM: Heart and pericardium are unremarkable. The central airways are clear. 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 Severe 3-vessel coronary atherosclerosis. LYMPH NODES: No mediastinal, hilar or axillary lymphadenopathy. LUNGS AND PLEURA: Eventration of the left hemidiaphragm with associated lingular and left lower lobe opacity, likely atelectasis. Mild dependent atelectasis in the right lower lobe. Trace right pleural fluid. SOFT TISSUES/BONES: Gynecomastia. Degenerative changes of the visualized thoracolumbar spine. UPPER ABDOMEN: Mildly nodular hepatic contour, suggesting cirrhosis. Splenomegaly. Severe bilateral renal cortical atrophy. 2.4 cm simple right renal cyst (Bosniak 1), benign. Aortobiiliac stent with excluded aneurysm measuring at least 9.9 cm (series 2, image 199), incompletely visualized but progressive from the prior, previously 7.9 cm. IMPRESSION: 1. 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 2. No right apical pulmonary nodule. No acute cardiopulmonary abnormality. 3. Aortobiiliac stent with excluded aneurysm  measuring at least 9.9 cm, incompletely visualized but progressive from the prior, previously 7.9 cm. Follow up consultation with vascular surgery is suggested. Electronically signed by: Pinkie Pebbles MD 09/20/2023 03:41 AM EDT RP Workstation: [id number]   DG Chest 2 View Result Date: 09/20/2023 EXAM: 2 VIEW(S) XRAY OF THE CHEST 09/20/2023 12:32:22 AM COMPARISON: 09/19/2023 CLINICAL HISTORY: ? pneumonia. right upper lobe airspace opacity. Recommend; repeat chest x-ray PA and lateral view for further evaluation per radiologist; Fall on thinner yesterday FINDINGS: LUNGS AND PLEURA: Persistent medial right apical opacity obscured on the lateral view. Consider CT chest for further evaluation. No pulmonary edema. No pleural effusion. No pneumothorax. HEART AND MEDIASTINUM: No acute abnormality of the cardiac and mediastinal silhouettes. BONES AND SOFT TISSUES: No acute osseous abnormality. IMPRESSION: 1. Persistent medial right apical opacity. 2. Consider CT chest for further evaluation. Electronically signed by: Pinkie Pebbles MD 09/20/2023 12:40 AM EDT RP Workstation: [id number]   CT HEAD WO CONTRAST Result Date: 09/19/2023 CLINICAL DATA:  Status post fall, on anticoagulation. EXAM: CT HEAD WITHOUT CONTRAST TECHNIQUE: Contiguous axial images were obtained from the base of the skull through the vertex without intravenous contrast. RADIATION DOSE REDUCTION: This exam was performed according to the departmental dose-optimization program which includes automated exposure control, adjustment of the mA and/or kV according to patient size and/or use of iterative reconstruction technique. COMPARISON:  07/30/2022 FINDINGS: Brain: No intracranial hemorrhage, mass effect, or midline shift. Age related atrophy. No hydrocephalus. The basilar  cisterns are patent. No evidence of territorial infarct or acute ischemia. Stable periventricular and deep white matter hypodensity typical of chronic small vessel ischemia. No  extra-axial or intracranial fluid collection. Vascular: Atherosclerosis of skullbase vasculature without hyperdense vessel or abnormal calcification. Skull: No fracture or focal lesion. Sinuses/Orbits: No acute finding. Other: No confluent scalp hematoma. IMPRESSION: 1. No acute intracranial abnormality. No skull fracture. 2. Age related atrophy and chronic small vessel ischemia. Electronically Signed   By: Andrea Gasman M.D.   On: 09/19/2023 22:12   CT CERVICAL SPINE WO CONTRAST Result Date: 09/19/2023 CLINICAL DATA:  Blunt trauma, status post fall.  On anticoagulation. EXAM: CT CERVICAL SPINE WITHOUT CONTRAST TECHNIQUE: Multidetector CT imaging of the cervical spine was performed without intravenous contrast. Multiplanar CT image reconstructions were also generated. RADIATION DOSE REDUCTION: This exam was performed according to the departmental dose-optimization program which includes automated exposure control, adjustment of the mA and/or kV according to patient size and/or use of iterative reconstruction technique. COMPARISON:  Cervical spine CT 08/13/2021 FINDINGS: Alignment: No traumatic subluxation. Mild levo scoliotic curvature. Grade 1 anterolisthesis of C3 on C4, C4 on C5, and C5 on C6. Skull base and vertebrae: No acute fracture. Mild flattening of C6 vertebra is chronic. The dens and skull base are intact. Soft tissues and spinal canal: No prevertebral fluid or swelling. No visible canal hematoma. Disc levels: Mild multilevel degenerative disc disease. No spinal canal stenosis. Upper chest: Only minimally included in the field of view. Other: None. IMPRESSION: 1. No acute fracture or subluxation of the cervical spine. 2. Mild multilevel degenerative disc disease. Electronically Signed   By: Andrea Gasman M.D.   On: 09/19/2023 22:08   DG Pelvis Portable Result Date: 09/19/2023 CLINICAL DATA:  Trauma EXAM: PORTABLE PELVIS 1-2 VIEWS COMPARISON:  None Available. FINDINGS: No acute displaced  fracture or dislocation of either hips. There is no evidence of pelvic fracture or diastasis. No pelvic bone lesions are seen. Penile implant noted. Aorto bi-iliac stent graft noted. Vascular calcifications. IMPRESSION: Negative for acute traumatic injury. Electronically Signed   By: Morgane  Naveau M.D.   On: 09/19/2023 21:40   DG Chest Port 1 View Result Date: 09/19/2023 CLINICAL DATA:  Trauma level 2 fall on thinners EXAM: PORTABLE CHEST 1 VIEW COMPARISON:  Chest x-ray 07/30/2022 FINDINGS: The heart and mediastinal contours are unchanged. Atherosclerotic plaque. Elevated left hemidiaphragm with left base atelectasis. Question developing right upper lobe airspace opacity. No pulmonary edema. No pleural effusion. No pneumothorax. No acute osseous abnormality. IMPRESSION: 1. Question developing right upper lobe airspace opacity. Recommend repeat chest x-ray PA and lateral view for further evaluation. 2.  Aortic Atherosclerosis (ICD10-I70.0). Electronically Signed   By: Morgane  Naveau M.D.   On: 09/19/2023 21:39    Medications:  anticoagulant sodium citrate       atorvastatin   40 mg Oral QHS   Chlorhexidine  Gluconate Cloth  6 each Topical Q0600   donepezil   10 mg Oral QHS   levothyroxine   50 mcg Oral Q0600   midodrine   10 mg Oral Q T,Th,Sat-1800   QUEtiapine   25 mg Oral QHS   sodium chloride  flush  3 mL Intravenous Q12H    Dialysis Orders: [facility] 3h  B350   74.8kg   AVF  Heparin  none Wt gains very small, dry wt being lowered Last HD 7/22, post wt 74.8kg  Home bp meds: Midodrine  10 mg pre-HD TTS Others: Lipitor , PhosLo , Aricept , Eliquis , T4, SL NTG, Seroquel , Ultram   Assessment/Plan: Multiple falls: deconditioning, multiple  comorbidities. Per pmd.  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 On HD BP: chronic hypotension on midodrine  pre HD TTS. Continue.  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

## 2023-09-21 NOTE — Progress Notes (Signed)
 Pt receives out-pt HD at [facility] 11:35 am chair time. HD info provided to CSW for d/c planning purposes. Will assist as needed.   Randine Mungo Dialysis Navigator [phone]

## 2023-09-21 NOTE — Consult Note (Signed)
 Chief Complaint: Patient was seen in consultation today for  Chief Complaint  Patient presents with   Fall   Referring Physician(s): Dr. Magda   Supervising Physician: Jenna Hacker  Patient Status: [facility] - In-pt  History of Present Illness: Ricky Harris is an 83 y.o. male with a medical history significant for ESRD on hemodialysis, paroxysmal atrial fibrillation on Eliquis , CAD, heart failure, anemia, thrombocytopenia and abdominal aortic aneurysm s/p repair 12/2018 followed by type II endoleak repair by IR in 2023. He was lost to Vascular follow up after this.    He presented to the ED 09/19/23 for evaluation of generalized weakness and multiple falls. His overall work up was unrevealing but imaging showed an enlarging AAA.   CTA Chest/Abdomen/Pelvis 09/20/23 IMPRESSION: 1. 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 The native sac measures 8.9 x 7.5 cm, increased from 7.9 x 6.7 cm on the prior study. 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 2. Moderate atherosclerosis at the origin of both renal arteries with at least mild left renal artery narrowing. 3. Cirrhosis 4. Lower pole left renal complex lesion measuring 1.4 cm, likely minimally enlarged compared to 11/04/2021. Consider further evaluation with non-emergent outpatient pre- and post-contrast abdominal MRI or renal protocol CT. 5. No posttraumatic deformity identified.  Vascular Surgery was consulted and the patient was evaluated by Dr. Magda. The recommendation was then made for Interventional Radiology to treat the aneurysm. Imaging reviewed by Dr. Philip.   Past Medical History:  Diagnosis Date   A-fib (HCC) 09/18/2020   AAA (abdominal aortic aneurysm) (HCC)    5.3cm , magd by vascular Dr Eliza ,   Anemia     slightly   Arthritis    knees   Ascites    Cancer (HCC)    prostate   CHF (congestive heart failure) (HCC)    HFrEF  08/2020   Chronic kidney disease    progressed to ESRD 08/2019   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[facility])    Coronary artery disease    on CT scan   Gout    H/O hypercholesterolemia    Heart murmur    History of falling    recent- see ct chest results of 04/25/2018   Hypertension    Hypothyroidism    Liver cirrhosis (HCC)    Myocardial infarction (HCC)    NSTEMI 04/25/21, treated medically   Prediabetes    pt denies    Renal artery stenosis (HCC)    Short-term memory loss    Thrombocytopenia (HCC)    Wears glasses     Past Surgical History:  Procedure Laterality Date   ABDOMINAL AORTIC ENDOVASCULAR STENT GRAFT N/A 01/21/2019   Procedure: ABDOMINAL AORTIC ENDOVASCULAR STENT GRAFT WITH CO2;  Surgeon: Eliza Lonni RAMAN, MD;  Location: [facility] OR;  Service: Vascular;  Laterality: N/A;   ABDOMINAL AORTOGRAM W/LOWER EXTREMITY Bilateral 12/14/2018   Procedure: ABDOMINAL AORTOGRAM W/LOWER EXTREMITY;  Surgeon: Eliza Lonni RAMAN, MD;  Location: [facility] INVASIVE CV LAB;  Service: Cardiovascular;  Laterality: Bilateral;   AV FISTULA PLACEMENT Left 03/12/2019   Procedure: LEFT ARTERIOVENOUS Arteriovenous FISTULA CREATION.;  Surgeon: Eliza Lonni RAMAN, MD;  Location: [facility] OR;  Service: Vascular;  Laterality: Left;   CATARACT EXTRACTION W/ INTRAOCULAR LENS  IMPLANT, BILATERAL     CHOLECYSTECTOMY     ESOPHAGEAL BANDING N/A 08/11/2017   Procedure: ESOPHAGEAL BANDING;  Surgeon: Elicia Claw, MD;  Location: [facility] ENDOSCOPY;  Service: Gastroenterology;  Laterality: N/A;  ESOPHAGEAL BANDING N/A 10/26/2017   Procedure: ESOPHAGEAL BANDING;  Surgeon: Elicia Claw, MD;  Location: [facility] ENDOSCOPY;  Service: Gastroenterology;  Laterality: N/A;   ESOPHAGEAL BANDING N/A 12/19/2017   Procedure: ESOPHAGEAL BANDING;  Surgeon: Elicia Claw, MD;  Location: [facility] ENDOSCOPY;  Service: Gastroenterology;  Laterality: N/A;   ESOPHAGOGASTRODUODENOSCOPY (EGD) WITH PROPOFOL  N/A 07/05/2017   Procedure:  ESOPHAGOGASTRODUODENOSCOPY (EGD) WITH PROPOFOL ;  Surgeon: Elicia Claw, MD;  Location: [facility] ENDOSCOPY;  Service: Gastroenterology;  Laterality: N/A;   ESOPHAGOGASTRODUODENOSCOPY (EGD) WITH PROPOFOL  N/A 08/11/2017   Procedure: ESOPHAGOGASTRODUODENOSCOPY (EGD) WITH PROPOFOL ;  Surgeon: Elicia Claw, MD;  Location: [facility];  Service: Gastroenterology;  Laterality: N/A;   ESOPHAGOGASTRODUODENOSCOPY (EGD) WITH PROPOFOL  N/A 10/26/2017   Procedure: ESOPHAGOGASTRODUODENOSCOPY (EGD) WITH PROPOFOL ;  Surgeon: Elicia Claw, MD;  Location: [facility];  Service: Gastroenterology;  Laterality: N/A;   ESOPHAGOGASTRODUODENOSCOPY (EGD) WITH PROPOFOL  N/A 12/19/2017   Procedure: ESOPHAGOGASTRODUODENOSCOPY (EGD) WITH PROPOFOL ;  Surgeon: Elicia Claw, MD;  Location: [facility];  Service: Gastroenterology;  Laterality: N/A;   ESOPHAGOGASTRODUODENOSCOPY (EGD) WITH PROPOFOL  N/A 10/29/2018   Procedure: ESOPHAGOGASTRODUODENOSCOPY (EGD) WITH PROPOFOL ;  Surgeon: Elicia Claw, MD;  Location: [facility];  Service: Gastroenterology;  Laterality: N/A;   ESOPHAGOGASTRODUODENOSCOPY (EGD) WITH PROPOFOL  N/A 02/14/2020   Procedure: ESOPHAGOGASTRODUODENOSCOPY (EGD) WITH PROPOFOL ;  Surgeon: Elicia Claw, MD;  Location: [facility];  Service: Gastroenterology;  Laterality: N/A;   EYE SURGERY     bilateral cataract removal with lens placement   HERNIA REPAIR     IR ANGIOGRAM PULMONARY BILATERAL SELECTIVE  12/03/2021   IR ANGIOGRAM SELECTIVE EACH ADDITIONAL VESSEL  12/03/2021   IR ANGIOGRAM VISCERAL SELECTIVE  12/03/2021   IR ANGIOGRAM VISCERAL SELECTIVE  12/03/2021   IR ANGIOGRAM VISCERAL SELECTIVE  12/03/2021   IR CT SPINE LTD  12/03/2021   IR PARACENTESIS  12/26/2018   IR PARACENTESIS  01/02/2019   IR PARACENTESIS  01/15/2019   IR PARACENTESIS  01/30/2019   IR PARACENTESIS  02/05/2019   IR PARACENTESIS  02/14/2019   IR PARACENTESIS  02/28/2019   IR PARACENTESIS  03/14/2019   IR PARACENTESIS  03/29/2019    IR PARACENTESIS  04/11/2019   IR PARACENTESIS  04/25/2019   IR PARACENTESIS  05/09/2019   IR PARACENTESIS  06/03/2019   IR PARACENTESIS  06/06/2019   IR PARACENTESIS  06/20/2019   IR PARACENTESIS  07/03/2019   IR PARACENTESIS  07/19/2019   IR PARACENTESIS  08/08/2019   IR PARACENTESIS  08/27/2019   IR PARACENTESIS  09/09/2019   IR PARACENTESIS  09/24/2019   IR PARACENTESIS  10/07/2019   IR PARACENTESIS  10/23/2019   IR PARACENTESIS  11/07/2019   IR PARACENTESIS  11/21/2019   IR PARACENTESIS  12/04/2019   IR PARACENTESIS  12/25/2019   IR PARACENTESIS  01/10/2020   IR PARACENTESIS  01/27/2020   IR PARACENTESIS  02/19/2020   IR PARACENTESIS  03/27/2020   IR RADIOLOGIST EVAL & MGMT  11/16/2021   IR RADIOLOGIST EVAL & MGMT  08/24/2022   IR TRANSCATH/EMBOLIZ  12/03/2021   IR US  GUIDE VASC ACCESS RIGHT  12/03/2021   PROSTATECTOMY     RADIOLOGY WITH ANESTHESIA N/A 12/03/2021   Procedure: Type II Endoleak;  Surgeon: Alona Corners, DO;  Location: [facility] OR;  Service: Anesthesiology;  Laterality: N/A;   RIGHT AND LEFT HEART CATH N/A 09/18/2020   Procedure: RIGHT AND LEFT HEART CATH;  Surgeon: Burnard Debby LABOR, MD;  Location: [facility] INVASIVE CV LAB;  Service: Cardiovascular;  Laterality: N/A;   subtotal gastrectomy  Allergies: Cephalexin and Hydralazine   Medications: Prior to Admission medications   Medication Sig Start Date End Date Taking? Authorizing Provider  atorvastatin  (LIPITOR ) 40 MG tablet Take 1 tablet (40 mg total) by mouth daily. Patient taking differently: Take 40 mg by mouth every evening. 04/28/21  Yes Rojelio Nest, DO  calcium  acetate (PHOSLO ) 667 MG capsule Take 667 mg by mouth in the morning, at noon, and at bedtime. 03/02/23 03/01/24 Yes [provider]  donepezil  (ARICEPT ) 10 MG tablet Take 10 mg by mouth every evening.   Yes [provider]  ELIQUIS  2.5 MG TABS tablet Take 1 tablet by mouth twice daily. 04/06/23  Yes [place], Peter M, MD  levothyroxine  (SYNTHROID ) 50 MCG tablet Take  1 tablet (50 mcg total) by mouth daily. 04/27/21  Yes Rojelio Nest, DO  midodrine  (PROAMATINE ) 10 MG tablet Take 1 tablet (10 mg total) by mouth daily as needed (takes 3 days a week on dialysis days). Patient taking differently: Take 10 mg by mouth 3 (three) times a week. Tuesdays, Thursdays, Saturdays 11/07/22  Yes [place], Peter M, MD  multivitamin (RENA-VIT) TABS tablet Take 1 tablet by mouth daily. Patient taking differently: Take 1 tablet by mouth every evening. 09/27/19  Yes Autry-Lott, Rojean, DO  nitroGLYCERIN  (NITROSTAT ) 0.4 MG SL tablet Place 0.4 mg under the tongue every 5 (five) minutes as needed for chest pain.   Yes [provider]  QUEtiapine  (SEROQUEL ) 25 MG tablet Take 25 mg by mouth every evening.   Yes [provider]  traMADol  (ULTRAM ) 50 MG tablet Take 1 tablet (50 mg total) by mouth every 8 (eight) hours as needed for moderate pain. Patient taking differently: Take 50 mg by mouth daily as needed for moderate pain (pain score 4-6). 03/12/19  Yes Rhyne, Samantha J, PA-C  doxycycline (VIBRAMYCIN) 100 MG capsule Take 100 mg by mouth 2 (two) times daily. Patient not taking: Reported on 09/20/2023 11/05/22   [provider]     Family History  Problem Relation Age of Onset   Diabetes Mellitus II Mother    Stroke Mother     Social History   Socioeconomic History   Marital status: Divorced    Spouse name: Not on file   Number of children: Not on file   Years of education: Not on file   Highest education level: Not on file  Occupational History   Not on file  Tobacco Use   Smoking status: Never   Smokeless tobacco: Never  Vaping Use   Vaping status: Never Used  Substance and Sexual Activity   Alcohol use: Not Currently   Drug use: Never   Sexual activity: Not on file  Other Topics Concern   Not on file  Social History Narrative   Not on file   Social Drivers of Health   Financial Resource Strain: Not on file  Food Insecurity: No Food  Insecurity (09/20/2023)   Hunger Vital Sign    Worried About Running Out of Food in the Last Year: Never true    Ran Out of Food in the Last Year: Never true  Transportation Needs: No Transportation Needs (09/20/2023)   PRAPARE - Administrator, Civil Service (Medical): No    Lack of Transportation (Non-Medical): No  Physical Activity: Not on file  Stress: Not on file  Social Connections: Unknown (09/20/2023)   Social Connection and Isolation Panel    Frequency of Communication with Friends and Family: Once a week    Frequency of  Social Gatherings with Friends and Family: Three times a week    Attends Religious Services: Never    Active Member of Clubs or Organizations: No    Attends Banker Meetings: Never    Marital Status: Patient unable to answer    Review of Systems: A 12 point ROS discussed and pertinent positives are indicated in the HPI above.  All other systems are negative.  Review of Systems  Unable to perform ROS: Dementia    Vital Signs: BP (!) 146/58 (BP Location: Right Arm)   Pulse 79   Temp (!) 97.5 F (36.4 C) (Oral)   Resp 19   Ht 6' 1 (1.854 m)   Wt 158 lb 4.6 oz (71.8 kg)   SpO2 95%   BMI 20.88 kg/m   Physical Exam Constitutional:      General: He is not in acute distress.    Appearance: He is not ill-appearing.  HENT:     Mouth/Throat:     Mouth: Mucous membranes are moist.     Pharynx: Oropharynx is clear.  Cardiovascular:     Rate and Rhythm: Normal rate.  Pulmonary:     Effort: Pulmonary effort is normal.  Abdominal:     Tenderness: There is abdominal tenderness.  Skin:    General: Skin is warm and dry.  Neurological:     Mental Status: He is alert.     Comments: Patient awake and appeared to follow the conversation, comprehend the information being relayed, etc.      Labs:  CBC: Recent Labs    01/31/23 1732 09/19/23 2125 09/19/23 2130 09/20/23 0553 09/21/23 0353  WBC 5.9 7.7  --  6.8 5.0  HGB 10.2*  12.1* 12.9* 10.3* 9.9*  HCT 31.2* 37.9* 38.0* 31.3* 30.0*  PLT 124* 98*  --  93* 96*    COAGS: No results for input(s): INR, APTT in the last 8760 hours.  BMP: Recent Labs    01/31/23 1732 09/19/23 2125 09/19/23 2130 09/20/23 0553 09/21/23 0353  NA 132* 138 136 138 135  K 3.2* 3.7 3.7 3.8 4.1  CL 92* 97* 100 98 98  CO2 28 25  --  27 26  GLUCOSE 143* 123* 117* 93 95  BUN 43* 31* 32* 37* 53*  CALCIUM  7.7* 8.5*  --  8.3* 8.1*  CREATININE 5.51* 4.87* 4.70* 5.47* 7.67*  GFRNONAA 10* 11*  --  10* 7*    LIVER FUNCTION TESTS: Recent Labs    11/16/22 1731 01/31/23 1732 09/19/23 2125 09/21/23 0353  BILITOT 0.9 0.9 1.3*  --   AST 42* 45* 62*  --   ALT 38 49* 43  --   ALKPHOS 266* 272* 235*  --   PROT 7.1 6.9 7.0  --   ALBUMIN  3.3* 3.2* 3.1* 2.6*    TUMOR MARKERS: No results for input(s): AFPTM, CEA, CA199, CHROMGRNA in the last 8760 hours.  Assessment and Plan:  History of AAA s/p repair in 2020 by Vascular and Type II endoleak s/p repair by IR in 2023. Patient now with enlarging aneurysm: Ricky Harris, 83 year old male, is tentatively scheduled 09/22/23 for an image-guided aortogram with possible intervention. The procedure is to be performed by Dr. Jenna.  Dr. Philip met with the patient at the bedside to discuss the procedure. Dr. Philip reviewed the imaging findings and the proposed treatment options. Dr. Philip shared the procedure may need to be performed in a staged fashion with one side being treated tomorrow with plans to  treat the other side at a later date.   The patient verbalized that he was interested in proceeding. The patient's son was contacted via telephone and the procedure was discussed with him. Telephone consent obtained from the patient's son, Ricky Harris.   Risks and benefits of this procedure were discussed with the patient including, but not limited to bleeding, infection, vascular injury or contrast induced renal failure.  All of the  patient's questions were answered, patient is agreeable to proceed. The patient will be NPO at midnight. The last dose of Eliquis  was 09/20/23 around 2 am.   Consent signed and in IR.   Thank you for this interesting consult.  I greatly enjoyed meeting Ricky Harris and look forward to participating in their care.  A copy of this report was sent to the requesting provider on this date.  Electronically Signed: Warren Dais, AGACNP-BC 09/21/2023, 3:05 PM   I spent a total of 20 Minutes    in face to face in clinical consultation, greater than 50% of which was counseling/coordinating care for Endoleak.

## 2023-09-21 NOTE — Progress Notes (Signed)
 Received patient in bed to unit.  Alert and oriented.  Informed consent signed and in chart.   TX duration:3  Patient tolerated well.  Transported back to the room  Alert, without acute distress.  Hand-off given to patient's nurse.   Access used: AVF Access issues: NA  Total UF removed: 1.5L Medication(s) given: NA Post HD weight: 71.8KG   09/21/23 1316  Vitals  Temp (!) 97.4 F (36.3 C)  Temp Source Oral  BP 117/75  MAP (mmHg) 88  ECG Heart Rate 65  Resp (!) 21  Weight 71.8 kg  Type of Weight Post-Dialysis  Oxygen Therapy  SpO2 95 %  O2 Device Room Air  During Treatment Monitoring  Blood Flow Rate (mL/min) 0 mL/min  Arterial Pressure (mmHg) -20.2 mmHg  Venous Pressure (mmHg) 67.67 mmHg  TMP (mmHg) -5.86 mmHg  Ultrafiltration Rate (mL/min) 765 mL/min  Dialysate Flow Rate (mL/min) 299 ml/min  Duration of HD Treatment -hour(s) 3 hour(s)  Cumulative Fluid Removed (mL) per Treatment  1500.17  HD Safety Checks Performed Yes  Intra-Hemodialysis Comments Tx completed  Post Treatment  Dialyzer Clearance Lightly streaked  Hemodialysis Intake (mL) 0 mL  Liters Processed 63  Fluid Removed (mL) 1500 mL  Tolerated HD Treatment Yes  AVG/AVF Arterial Site Held (minutes) 5 minutes  AVG/AVF Venous Site Held (minutes) 5 minutes  Fistula / Graft Left Upper arm  No placement date or time found.   Orientation: Left  Access Location: Upper arm  Site Condition No complications  Fistula / Graft Assessment Present;Thrill;Bruit  Status Deaccessed        [facility]

## 2023-09-22 ENCOUNTER — Other Ambulatory Visit: Payer: Self-pay | Admitting: Physician Assistant

## 2023-09-22 ENCOUNTER — Inpatient Hospital Stay (HOSPITAL_COMMUNITY)

## 2023-09-22 DIAGNOSIS — W1830XA Fall on same level, unspecified, initial encounter: Secondary | ICD-10-CM | POA: Diagnosis not present

## 2023-09-22 DIAGNOSIS — E038 Other specified hypothyroidism: Secondary | ICD-10-CM | POA: Diagnosis not present

## 2023-09-22 DIAGNOSIS — I9589 Other hypotension: Secondary | ICD-10-CM | POA: Diagnosis not present

## 2023-09-22 DIAGNOSIS — I9789 Other postprocedural complications and disorders of the circulatory system, not elsewhere classified: Secondary | ICD-10-CM

## 2023-09-22 HISTORY — PX: IR ANGIOGRAM SELECTIVE EACH ADDITIONAL VESSEL: IMG667

## 2023-09-22 HISTORY — PX: IR ANGIOGRAM PELVIS SELECTIVE OR SUPRASELECTIVE: IMG661

## 2023-09-22 HISTORY — PX: IR AORTA/THORACIC: IMG634

## 2023-09-22 HISTORY — PX: IR US GUIDE VASC ACCESS RIGHT: IMG2390

## 2023-09-22 HISTORY — PX: IR ANGIOGRAM VISCERAL SELECTIVE: IMG657

## 2023-09-22 LAB — PROTIME-INR
INR: 1.1 (ref 0.8–1.2)
Prothrombin Time: 14.8 s (ref 11.4–15.2)

## 2023-09-22 LAB — RENAL FUNCTION PANEL
Albumin: 2.7 g/dL — ABNORMAL LOW (ref 3.5–5.0)
Anion gap: 14 (ref 5–15)
BUN: 56 mg/dL — ABNORMAL HIGH (ref 8–23)
CO2: 25 mmol/L (ref 22–32)
Calcium: 8.1 mg/dL — ABNORMAL LOW (ref 8.9–10.3)
Chloride: 98 mmol/L (ref 98–111)
Creatinine, Ser: 7.75 mg/dL — ABNORMAL HIGH (ref 0.61–1.24)
GFR, Estimated: 6 mL/min — ABNORMAL LOW (ref >60)
Glucose, Bld: 127 mg/dL — ABNORMAL HIGH (ref 70–99)
Phosphorus: 4.1 mg/dL (ref 2.5–4.6)
Potassium: 3.9 mmol/L (ref 3.5–5.1)
Sodium: 137 mmol/L (ref 135–145)

## 2023-09-22 LAB — CBC WITH DIFFERENTIAL/PLATELET
Abs Immature Granulocytes: 0.02 K/uL (ref 0.00–0.07)
Basophils Absolute: 0.1 K/uL (ref 0.0–0.1)
Basophils Relative: 1 %
Eosinophils Absolute: 0.2 K/uL (ref 0.0–0.5)
Eosinophils Relative: 3 %
HCT: 31.9 % — ABNORMAL LOW (ref 39.0–52.0)
Hemoglobin: 10.6 g/dL — ABNORMAL LOW (ref 13.0–17.0)
Immature Granulocytes: 0 %
Lymphocytes Relative: 11 %
Lymphs Abs: 0.7 K/uL (ref 0.7–4.0)
MCH: 32.6 pg (ref 26.0–34.0)
MCHC: 33.2 g/dL (ref 30.0–36.0)
MCV: 98.2 fL (ref 80.0–100.0)
Monocytes Absolute: 0.5 K/uL (ref 0.1–1.0)
Monocytes Relative: 9 %
Neutro Abs: 4.5 K/uL (ref 1.7–7.7)
Neutrophils Relative %: 76 %
Platelets: 116 K/uL — ABNORMAL LOW (ref 150–400)
RBC: 3.25 MIL/uL — ABNORMAL LOW (ref 4.22–5.81)
RDW: 14.6 % (ref 11.5–15.5)
WBC: 5.9 K/uL (ref 4.0–10.5)
nRBC: 0 % (ref 0.0–0.2)

## 2023-09-22 LAB — BASIC METABOLIC PANEL WITH GFR
Anion gap: 13 (ref 5–15)
BUN: 56 mg/dL — ABNORMAL HIGH (ref 8–23)
CO2: 26 mmol/L (ref 22–32)
Calcium: 8 mg/dL — ABNORMAL LOW (ref 8.9–10.3)
Chloride: 98 mmol/L (ref 98–111)
Creatinine, Ser: 7.74 mg/dL — ABNORMAL HIGH (ref 0.61–1.24)
GFR, Estimated: 6 mL/min — ABNORMAL LOW (ref >60)
Glucose, Bld: 130 mg/dL — ABNORMAL HIGH (ref 70–99)
Potassium: 4 mmol/L (ref 3.5–5.1)
Sodium: 137 mmol/L (ref 135–145)

## 2023-09-22 LAB — MAGNESIUM: Magnesium: 2 mg/dL (ref 1.7–2.4)

## 2023-09-22 LAB — HEPATITIS B SURFACE ANTIBODY, QUANTITATIVE: Hep B S AB Quant (Post): 7868 m[IU]/mL

## 2023-09-22 MED ORDER — FENTANYL CITRATE (PF) 100 MCG/2ML IJ SOLN
INTRAMUSCULAR | Status: AC | PRN
  Administered 2023-09-22 (×3): 25 ug via INTRAVENOUS

## 2023-09-22 MED ORDER — LIDOCAINE-EPINEPHRINE 1 %-1:100000 IJ SOLN
INTRAMUSCULAR | Status: AC
  Filled 2023-09-22: qty 1

## 2023-09-22 MED ORDER — LIDOCAINE-EPINEPHRINE 1 %-1:100000 IJ SOLN
20.0000 mL | Freq: Once | INTRAMUSCULAR | Status: AC
  Administered 2023-09-22: 10 mL via INTRADERMAL

## 2023-09-22 MED ORDER — MIDAZOLAM HCL 2 MG/2ML IJ SOLN
INTRAMUSCULAR | Status: AC | PRN
Start: 2023-09-22 — End: 2023-09-22
  Administered 2023-09-22 (×3): .5 mg via INTRAVENOUS

## 2023-09-22 MED ORDER — FENTANYL CITRATE (PF) 100 MCG/2ML IJ SOLN
INTRAMUSCULAR | Status: AC
  Filled 2023-09-22: qty 2

## 2023-09-22 MED ORDER — CHLORHEXIDINE GLUCONATE CLOTH 2 % EX PADS
6.0000 | MEDICATED_PAD | Freq: Every day | CUTANEOUS | Status: DC
  Administered 2023-09-23 – 2023-09-26 (×4): 6 via TOPICAL

## 2023-09-22 MED ORDER — IOHEXOL 300 MG/ML  SOLN
100.0000 mL | Freq: Once | INTRAMUSCULAR | Status: AC | PRN
  Administered 2023-09-22: 65 mL via INTRA_ARTERIAL

## 2023-09-22 MED ORDER — MIDAZOLAM HCL 2 MG/2ML IJ SOLN
INTRAMUSCULAR | Status: AC
Start: 2023-09-22 — End: 2023-09-22
  Filled 2023-09-22: qty 2

## 2023-09-22 MED ORDER — IOHEXOL 300 MG/ML  SOLN: 100.0000 mL | Freq: Once | INTRAMUSCULAR | Status: DC | PRN

## 2023-09-22 NOTE — Progress Notes (Signed)
 [facility] Progress Note Patient: Ricky Harris FMW:[id number] DOB: [birth year] DOA: 09/19/2023  DOS: the patient was seen and examined on 09/22/2023  Brief Hospital Course: Patient with ESRD on HD TTS, dementia, paroxysmal A-fib, CAD, HFrEF, cirrhosis, hypothyroidism, AAA with repair with endoleak present to the hospital with complaints of passing out event with a fall.  Assessment and Plan: Recurrent fall Likely deconditioning with orthostasis. CT head, C spine, XR Pelvis, CXR without any acute traumatic injuries.  PT recommending SNF. Will recheck orthostatic tomorrow before and after HD.   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  Enlarging excluded aneurysm sac. Last imaging 12/'24 was 7.9 cm  now 9.9 cm on CT this admission. Vascular surgery was consulted.  Appreciate assistance. IR consulted.  Underwent IR guided procedure on 7/25.  Failed attempt at embolization of the endoleak.  Patient will require repeat procedure.  Will monitor in the hospital.   Elevated troponin. No hx chest pain. EKG with no acute ischemic change.  Troponin elevated in 50s. Most likely demand ischemia. Not a candidate for aggressive anticoagulation given endoleak.   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 No pneumonia  Stop antibiotics, no evidence of pneumonia   Dementia:  Continue home donepezil , Seroquel  at bedtime  ESRD on TTS HD:  Appreciate nephrology consultation. Monitor   Paroxysmal A-fib:  On Eliquis  for anticoagulation; hold in setting of enlarging aortic aneurysm sac  CAD known severe three-vessel disease, not felt to be a good candidate for CABG or PCI. On DOAC per above, currently held.  Continue home atorvastatin   Chronic HFrEF:  Volume management  with HD.  Hx cirrhosis, chronic liver injury stable:  appears stable. Ammonia wnl   Hypothyroidism:  Continue home Levothyroxine .     Subjective: No nausea no vomiting no fever no chills.  No chest pain.  Abdominal pain improved.  Physical Exam: Clear to auscultation. Aortic systolic murmur. S1-S2 present. Bowel sound present.  Nontender today. No edema.  Data Reviewed: I have Reviewed nursing notes, Vitals, and Lab results. Since last encounter, pertinent lab results CBC and BMP   . I have ordered test including CBC and BMP  .  Disposition: Status is: Inpatient Remains inpatient appropriate because: Awaiting improvement in endoleak  Place and maintain sequential compression device Start: 09/20/23 0718   Family Communication: Discussed with family at bedside Level of care: Progressive   Vitals:   09/22/23 1420 09/22/23 1425 09/22/23 1430 09/22/23 1435  BP: (!) 121/55 (!) 124/51 (!) 121/54 (!) 114/57  Pulse: 68 68 74 76  Resp: 13 15 20 18   Temp:      TempSrc:      SpO2: 100% 100% 100% 100%  Weight:      Height:         Author: Yetta Blanch, MD 09/22/2023 6:15 PM  Please look on [email or web address] to find out who is on call.

## 2023-09-22 NOTE — TOC Progression Note (Signed)
 Transition of Care [facility]) - Progression Note    Patient Details  Name: MAHDI FRYE MRN: [id number] Date of Birth: 1940-10-04  Transition of Care [facility]) CM/SW Contact  Luann SHAUNNA [place], [place] Phone Number: 09/22/2023, 4:12 PM  Clinical Narrative:     SNF auth approved 7/25-7/29. Auth# [id number]  OPHD able to be changed [facility] MWF 11:30 am chair time   TOC will continue to follow to assist with DC once pt is stable.   Expected Discharge Plan: Skilled Nursing Facility Barriers to Discharge: Continued Medical Work up,                Expected Discharge Plan and Services       Living arrangements for the past 2 months: Single Family Home                                       Social Drivers of Health (SDOH) Interventions SDOH Screenings   Food Insecurity: No Food Insecurity (09/20/2023)  Housing: Low Risk  (09/20/2023)  Transportation Needs: No Transportation Needs (09/20/2023)  Utilities: Not At Risk (09/20/2023)  Social Connections: Unknown (09/20/2023)  Tobacco Use: Low Risk  (09/19/2023)    Readmission Risk Interventions     No data to display

## 2023-09-22 NOTE — Progress Notes (Signed)
 [facility] Progress Note   Subjective:    Seen and examined patient in his room. Last HD yesterday with UFG 1.5L. BP is 121/60. He is comfortable and denies any issues. Followed by [facility]. 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  Objective Vitals:   09/21/23 1715 09/21/23 2016 09/22/23 0432 09/22/23 0837  BP: 138/63 (!) 143/61 110/66 121/60  Pulse: 80 74 86 81  Resp: 18 18 18 18   Temp: 97.9 F (36.6 C) 98.1 F (36.7 C) 98.5 F (36.9 C) 98 F (36.7 C)  TempSrc: Oral Oral  Oral  SpO2: 100% 100% 100% 98%  Weight:      Height:       Physical Exam General: Elderly male; hard of hearing; Awake, NAD Heart: S1 and S2; + murmur; no gallops or rubs Lungs: Clear anteriorly Abdomen: Soft  Extremities:No LE edema Dialysis Access: AVF   Filed Weights   09/21/23 0802 09/21/23 0946 09/21/23 1316  Weight: 73.3 kg 73.8 kg 71.8 kg    Intake/Output Summary (Last 24 hours) at 09/22/2023 1220 Last data filed at 09/21/2023 2105 Gross per 24 hour  Intake 200 ml  Output 1500 ml  Net -1300 ml    Additional Objective Labs: Basic Metabolic Panel: Recent Labs  Lab 09/20/23 0553 09/21/23 0353 09/22/23 0428  NA 138 135 137  137  K 3.8 4.1 4.0  3.9  CL 98 98 98  98  CO2 27 26 26  25   GLUCOSE 93 95 130*  127*  BUN 37* 53* 56*  56*  CREATININE 5.47* 7.67* 7.74*  7.75*  CALCIUM  8.3* 8.1* 8.0*  8.1*  PHOS 3.2 4.3 4.1   Liver Function Tests: Recent Labs  Lab 09/19/23 2125 09/21/23 0353 09/22/23 0428  AST 62*  --   --   ALT 43  --   --   ALKPHOS 235*  --   --   BILITOT 1.3*  --   --   PROT 7.0  --   --   ALBUMIN  3.1* 2.6* 2.7*   No results for input(s): LIPASE, AMYLASE in the last 168 hours. CBC: Recent Labs  Lab 09/19/23 2125 09/19/23 2130 09/20/23 0553 09/21/23 0353 09/22/23 0428  WBC 7.7  --  6.8 5.0 5.9  NEUTROABS 6.7  --   --  3.7 4.5  HGB 12.1*   < > 10.3* 9.9* 10.6*  HCT 37.9*   <  > 31.3* 30.0* 31.9*  MCV 101.6*  --  101.3* 96.5 98.2  PLT 98*  --  93* 96* 116*   < > = values in this interval not displayed.   Blood Culture    Component Value Date/Time   SDES BLOOD RIGHT HAND 07/06/2022 1925   SPECREQUEST  07/06/2022 1925    BOTTLES DRAWN AEROBIC ONLY Blood Culture results may not be optimal due to an inadequate volume of blood received in culture bottles   CULT  07/06/2022 1925    NO GROWTH 5 DAYS Performed at [facility], [address], [place], [place] [address]    REPTSTATUS 07/11/2022 FINAL 07/06/2022 1925    Cardiac Enzymes: No results for input(s): CKTOTAL, CKMB, CKMBINDEX, TROPONINI in the last 168 hours. CBG: Recent Labs  Lab 09/19/23 2129 09/21/23 1527 09/21/23 1731  GLUCAP 107* 54* 169*   Iron Studies: No results for input(s): IRON, TIBC, TRANSFERRIN, FERRITIN in the last 72 hours. Lab Results  Component Value Date   INR  1.1 09/22/2023   INR 1.3 (H) 11/04/2021   INR 1.3 (H) 10/23/2021   Studies/Results: No results found.   Medications:    atorvastatin   40 mg Oral QHS   Chlorhexidine  Gluconate Cloth  6 each Topical Q0600   donepezil   10 mg Oral QHS   levothyroxine   50 mcg Oral Q0600   midodrine   10 mg Oral Q T,Th,Sat-1800   QUEtiapine   25 mg Oral QHS   sodium chloride  flush  3 mL Intravenous Q12H    Dialysis Orders: [facility] 3h  B350   74.8kg   AVF  Heparin  none Wt gains very small, dry wt being lowered Last HD 7/22, post wt 74.8kg  Home bp meds: Midodrine  10 mg pre-HD TTS Others: Lipitor , PhosLo , Aricept , Eliquis , T4, SL NTG, Seroquel , Ultram   Assessment/Plan: Multiple falls: deconditioning, multiple comorbidities. Per pmd.  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 Next HD 09/23/23 BP: chronic hypotension on midodrine  pre HD TTS. Continue.  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 Patient is NPO in  preparation for potential endovascular repair of type 2 endoleak with IR today. HFrEF Cirrhosis of liver CAD: 3V CAD, was not felt to be candidate for CABG or PCI  Belvie Och, NP [facility] 09/22/2023,12:20 PM  LOS: 1 day

## 2023-09-22 NOTE — Progress Notes (Signed)
 Occupational Therapy Treatment Patient Details Name: Ricky Harris MRN: [id number] DOB: 1940/05/19 Today's Date: 09/22/2023   History of present illness Pt is an 83 y.o. male presenting 7/22 with multiple falls. 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 Pt also with acute MI and enlarging excluded AAA sac. PMHx: dementia, ESRD on TTS HD, pAfib, CAD, HFrEF, cirrhosis, hypothyroidism, anemia and thrombocytopenia, recent orthostatic hypotension, AAA prior endovascular repair   OT comments  Pt assisted OOB to chair with RW and min assist. No knee buckling this visit or complaints of dizziness in standing. Total assist for posterior pericare upon standing. Completed grooming in sitting with set up. Pt remained up in chair at end of session with chair alarm activated. Patient will benefit from continued inpatient follow up therapy, <3 hours/day.      If plan is discharge home, recommend the following:  A little help with walking and/or transfers;A little help with bathing/dressing/bathroom;Assistance with cooking/housework;Assist for transportation;Help with stairs or ramp for entrance   Equipment Recommendations  None recommended by OT    Recommendations for Other Services      Precautions / Restrictions Precautions Precautions: Fall Restrictions Weight Bearing Restrictions Per Provider Order: No       Mobility Bed Mobility Overal bed mobility: Needs Assistance Bed Mobility: Supine to Sit     Supine to sit: Min assist     General bed mobility comments: pulled trunk up with therapist's hand    Transfers Overall transfer level: Needs assistance Equipment used: Rolling walker (2 wheels) Transfers: Sit to/from Stand, Bed to chair/wheelchair/BSC Sit to Stand: Contact guard assist, +2 safety/equipment     Step pivot transfers: Min assist     General transfer comment: increased time to stand fully upright, steadying assist to step to chair      Balance Overall balance assessment: Needs assistance Sitting-balance support: No upper extremity supported, Feet supported Sitting balance-Leahy Scale: Fair     Standing balance support: Bilateral upper extremity supported, Single extremity supported, During functional activity Standing balance-Leahy Scale: Poor                             ADL either performed or assessed with clinical judgement   ADL   Eating/Feeding: NPO   Grooming: Wash/dry hands;Wash/dry face;Brushing hair;Sitting;Set up                                      Extremity/Trunk Assessment              Vision       Perception     Praxis     Communication Communication Communication: Impaired Factors Affecting Communication: Hearing impaired   Cognition Arousal: Alert Behavior During Therapy: WFL for tasks assessed/performed Cognition: History of cognitive impairments                               Following commands: Impaired Following commands impaired: Follows one step commands with increased time, Follows multi-step commands inconsistently      Cueing   Cueing Techniques: Verbal cues, Gestural cues  Exercises      Shoulder Instructions       General Comments      Pertinent Vitals/ Pain       Pain Assessment Pain Assessment: No/denies pain  Home Living  Prior Functioning/Environment              Frequency  Min 2X/week        Progress Toward Goals  OT Goals(current goals can now be found in the care plan section)  Progress towards OT goals: Progressing toward goals  Acute Rehab OT Goals OT Goal Formulation: With patient Time For Goal Achievement: 10/04/23 Potential to Achieve Goals: Good  Plan      Co-evaluation                 AM-PAC OT 6 Clicks Daily Activity     Outcome Measure   Help from another person eating meals?: None Help from another  person taking care of personal grooming?: A Little Help from another person toileting, which includes using toliet, bedpan, or urinal?: A Lot Help from another person bathing (including washing, rinsing, drying)?: A Lot Help from another person to put on and taking off regular upper body clothing?: A Little Help from another person to put on and taking off regular lower body clothing?: A Lot 6 Click Score: 16    End of Session Equipment Utilized During Treatment: Gait belt;Rolling walker (2 wheels)  OT Visit Diagnosis: Unsteadiness on feet (R26.81);Muscle weakness (generalized) (M62.81);Other symptoms and signs involving cognitive function   Activity Tolerance Patient tolerated treatment well   Patient Left in chair;with call bell/phone within reach;with chair alarm set   Nurse Communication          Time: [phone] OT Time Calculation (min): 31 min  Charges: OT General Charges $OT Visit: 1 Visit OT Treatments $Self Care/Home Management : 23-37 mins  Ricky Harris, OTR/L Acute Rehabilitation Services Office: [phone]   Ricky Harris 09/22/2023, 10:12 AM

## 2023-09-22 NOTE — Progress Notes (Addendum)
 Awaiting response from [facility] as to if clinic has a MWF appt available due to snf placement.   Randine Mungo Dialysis Navigator [phone]  Addendum at 4:12 pm: [facility] can accommodate a MWF 11:30 am chair time at d/c due to snf placement. Update provided to CSW for d/c planning purposes.

## 2023-09-22 NOTE — Procedures (Signed)
 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  The patient will return for a direct puncture embolization with Onyx and general anesthesia  Ricky DELENA Banner, MD

## 2023-09-22 NOTE — Progress Notes (Signed)
 Physical Therapy Treatment Patient Details Name: IKER NUTTALL MRN: [id number] DOB: Nov 04, 1940 Today's Date: 09/22/2023   History of Present Illness Pt is an 83 y.o. male presenting 7/22 with multiple falls. 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 Pt also with acute MI and enlarging excluded AAA sac. PMHx: dementia, ESRD on TTS HD, pAfib, CAD, HFrEF, cirrhosis, hypothyroidism, anemia and thrombocytopenia, recent orthostatic hypotension, AAA prior endovascular repair    PT Comments  Patient is agreeable to PT session. He continues to require assistance for mobility. Unable to stand long enough for progression of walking away from the bed. Activity tolerance limited by fatigue, generalized weakness. Recommend to continue PT to maximize independence and decrease caregiver burden. Consider rehabilitation < 3 hours/day after this hospital stay.    If plan is discharge home, recommend the following: A lot of help with walking and/or transfers;A lot of help with bathing/dressing/bathroom;Assistance with cooking/housework;Assist for transportation;Help with stairs or ramp for entrance   Can travel by private vehicle     No  Equipment Recommendations  None recommended by PT    Recommendations for Other Services       Precautions / Restrictions Precautions Precautions: Fall Restrictions Weight Bearing Restrictions Per Provider Order: No     Mobility  Bed Mobility Overal bed mobility: Needs Assistance Bed Mobility: Supine to Sit     Supine to sit: Min assist Sit to supine: Min assist   General bed mobility comments: assistance for trunk to sit upright and assistance for LE to return to bed. cues for sequencing, task initiation    Transfers Overall transfer level: Needs assistance Equipment used: Rolling walker (2 wheels) Transfers: Sit to/from Stand Sit to Stand: Min assist           General transfer comment: 3 standing bouts performed  with lifting assistance required. cues for anterior weight shifting and task initiation    Ambulation/Gait             Pre-gait activities: patient able to take 3 side steps to the right with unilateral UE support and Min A for steadying. activity tolerance limited by fatigue     Stairs             Wheelchair Mobility     Tilt Bed    Modified Rankin (Stroke Patients Only)       Balance Overall balance assessment: Needs assistance Sitting-balance support: No upper extremity supported, Feet supported Sitting balance-Leahy Scale: Fair     Standing balance support: Single extremity supported Standing balance-Leahy Scale: Poor Standing balance comment: external support required to maintain standing balance                            Communication Communication Communication: Impaired Factors Affecting Communication: Hearing impaired  Cognition Arousal: Alert Behavior During Therapy: WFL for tasks assessed/performed   PT - Cognitive impairments: No family/caregiver present to determine baseline, Sequencing, Problem solving, Safety/Judgement                       PT - Cognition Comments: cues required for command following Following commands: Impaired Following commands impaired: Follows one step commands with increased time, Follows multi-step commands inconsistently    Cueing Cueing Techniques: Verbal cues, Gestural cues  Exercises      General Comments        Pertinent Vitals/Pain Pain Assessment Pain Assessment: No/denies pain    Home Living  Prior Function            PT Goals (current goals can now be found in the care plan section) Acute Rehab PT Goals PT Goal Formulation: With patient Time For Goal Achievement: 10/04/23 Potential to Achieve Goals: Good Progress towards PT goals: Progressing toward goals    Frequency    Min 2X/week      PT Plan      Co-evaluation               AM-PAC PT 6 Clicks Mobility   Outcome Measure  Help needed turning from your back to your side while in a flat bed without using bedrails?: A Lot Help needed moving from lying on your back to sitting on the side of a flat bed without using bedrails?: A Lot Help needed moving to and from a bed to a chair (including a wheelchair)?: A Little Help needed standing up from a chair using your arms (e.g., wheelchair or bedside chair)?: A Little Help needed to walk in hospital room?: Total Help needed climbing 3-5 steps with a railing? : Total 6 Click Score: 12    End of Session   Activity Tolerance: Patient tolerated treatment well Patient left: in bed;with call bell/phone within reach;with bed alarm set Nurse Communication: Mobility status PT Visit Diagnosis: Unsteadiness on feet (R26.81);Other abnormalities of gait and mobility (R26.89);Muscle weakness (generalized) (M62.81);History of falling (Z91.81)     Time: [id number] PT Time Calculation (min) (ACUTE ONLY): 11 min  Charges:    $Therapeutic Activity: 8-22 mins PT General Charges $$ ACUTE PT VISIT: 1 Visit                     Randine Essex, PT, MPT    Randine LULLA Essex 09/22/2023, 10:49 AM

## 2023-09-23 ENCOUNTER — Other Ambulatory Visit (HOSPITAL_COMMUNITY)

## 2023-09-23 DIAGNOSIS — I9589 Other hypotension: Secondary | ICD-10-CM | POA: Diagnosis not present

## 2023-09-23 DIAGNOSIS — E038 Other specified hypothyroidism: Secondary | ICD-10-CM | POA: Diagnosis not present

## 2023-09-23 DIAGNOSIS — W1830XA Fall on same level, unspecified, initial encounter: Secondary | ICD-10-CM | POA: Diagnosis not present

## 2023-09-23 LAB — RENAL FUNCTION PANEL
Albumin: 2.8 g/dL — ABNORMAL LOW (ref 3.5–5.0)
Anion gap: 14 (ref 5–15)
BUN: 68 mg/dL — ABNORMAL HIGH (ref 8–23)
CO2: 26 mmol/L (ref 22–32)
Calcium: 8.3 mg/dL — ABNORMAL LOW (ref 8.9–10.3)
Chloride: 96 mmol/L — ABNORMAL LOW (ref 98–111)
Creatinine, Ser: 9.94 mg/dL — ABNORMAL HIGH (ref 0.61–1.24)
GFR, Estimated: 5 mL/min — ABNORMAL LOW (ref >60)
Glucose, Bld: 91 mg/dL (ref 70–99)
Phosphorus: 5.2 mg/dL — ABNORMAL HIGH (ref 2.5–4.6)
Potassium: 4.6 mmol/L (ref 3.5–5.1)
Sodium: 136 mmol/L (ref 135–145)

## 2023-09-23 LAB — CBC WITH DIFFERENTIAL/PLATELET
Abs Immature Granulocytes: 0.03 K/uL (ref 0.00–0.07)
Basophils Absolute: 0.1 K/uL (ref 0.0–0.1)
Basophils Relative: 1 %
Eosinophils Absolute: 0.2 K/uL (ref 0.0–0.5)
Eosinophils Relative: 3 %
HCT: 31.9 % — ABNORMAL LOW (ref 39.0–52.0)
Hemoglobin: 10.5 g/dL — ABNORMAL LOW (ref 13.0–17.0)
Immature Granulocytes: 1 %
Lymphocytes Relative: 10 %
Lymphs Abs: 0.7 K/uL (ref 0.7–4.0)
MCH: 32.3 pg (ref 26.0–34.0)
MCHC: 32.9 g/dL (ref 30.0–36.0)
MCV: 98.2 fL (ref 80.0–100.0)
Monocytes Absolute: 0.6 K/uL (ref 0.1–1.0)
Monocytes Relative: 10 %
Neutro Abs: 5 K/uL (ref 1.7–7.7)
Neutrophils Relative %: 75 %
Platelets: 113 K/uL — ABNORMAL LOW (ref 150–400)
RBC: 3.25 MIL/uL — ABNORMAL LOW (ref 4.22–5.81)
RDW: 15 % (ref 11.5–15.5)
WBC: 6.5 K/uL (ref 4.0–10.5)
nRBC: 0 % (ref 0.0–0.2)

## 2023-09-23 LAB — GLUCOSE, CAPILLARY: Glucose-Capillary: 84 mg/dL (ref 70–99)

## 2023-09-23 LAB — MAGNESIUM: Magnesium: 2.1 mg/dL (ref 1.7–2.4)

## 2023-09-23 NOTE — Progress Notes (Signed)
 Referring Provider(s): Dr. Debby Robertson, MD  Supervising Physician: Luverne Aran  Patient Status:  [facility] - In-pt  Chief Complaint: Multiple falls.  Subjective:  Patient alert and laying in bed, calm. He is in Hemodialysis, being dialyzed. Currently without any significant complaints.  Patient denies any fevers, headache, chest pain, SOB, cough, abdominal pain, nausea, vomiting or bleeding.    Allergies: Cephalexin and Hydralazine   Medications: Prior to Admission medications   Medication Sig Start Date End Date Taking? Authorizing Provider  atorvastatin  (LIPITOR ) 40 MG tablet Take 1 tablet (40 mg total) by mouth daily. Patient taking differently: Take 40 mg by mouth every evening. 04/28/21  Yes Rojelio Nest, DO  calcium  acetate (PHOSLO ) 667 MG capsule Take 667 mg by mouth in the morning, at noon, and at bedtime. 03/02/23 03/01/24 Yes [provider]  donepezil  (ARICEPT ) 10 MG tablet Take 10 mg by mouth every evening.   Yes [provider]  ELIQUIS  2.5 MG TABS tablet Take 1 tablet by mouth twice daily. 04/06/23  Yes [place], Peter M, MD  levothyroxine  (SYNTHROID ) 50 MCG tablet Take 1 tablet (50 mcg total) by mouth daily. 04/27/21  Yes Rojelio Nest, DO  midodrine  (PROAMATINE ) 10 MG tablet Take 1 tablet (10 mg total) by mouth daily as needed (takes 3 days a week on dialysis days). Patient taking differently: Take 10 mg by mouth 3 (three) times a week. Tuesdays, Thursdays, Saturdays 11/07/22  Yes [place], Peter M, MD  multivitamin (RENA-VIT) TABS tablet Take 1 tablet by mouth daily. Patient taking differently: Take 1 tablet by mouth every evening. 09/27/19  Yes Autry-Lott, Rojean, DO  nitroGLYCERIN  (NITROSTAT ) 0.4 MG SL tablet Place 0.4 mg under the tongue every 5 (five) minutes as needed for chest pain.   Yes [provider]  QUEtiapine  (SEROQUEL ) 25 MG tablet Take 25 mg by mouth every evening.   Yes [provider]  traMADol  (ULTRAM ) 50 MG tablet Take 1  tablet (50 mg total) by mouth every 8 (eight) hours as needed for moderate pain. Patient taking differently: Take 50 mg by mouth daily as needed for moderate pain (pain score 4-6). 03/12/19  Yes Rhyne, Samantha J, PA-C  doxycycline (VIBRAMYCIN) 100 MG capsule Take 100 mg by mouth 2 (two) times daily. Patient not taking: Reported on 09/20/2023 11/05/22   [provider]     Vital Signs: BP 114/61   Pulse 87   Temp 98.1 F (36.7 C)   Resp (!) 24   Ht 6' 1 (1.854 m)   Wt 167 lb 15.9 oz (76.2 kg)   SpO2 100%   BMI 22.16 kg/m   Physical Exam Constitutional:      Appearance: Normal appearance.  Cardiovascular:     Rate and Rhythm: Normal rate and regular rhythm.  Pulmonary:     Effort: Pulmonary effort is normal.  Abdominal:     General: Abdomen is flat.     Palpations: Abdomen is soft.  Skin:    General: Skin is warm and dry.     Comments: Right inguinal femoral access site appropriately dressed.  Dressing is clean, dry, intact. Incision site non-tender, without evidence of infection, bleeding, nor pseudoaneurysm.   Neurological:     Mental Status: He is alert and oriented to person, place, and time.      Labs:  CBC: Recent Labs    09/20/23 0553 09/21/23 0353 09/22/23 0428 09/23/23 0318  WBC 6.8 5.0 5.9 6.5  HGB 10.3* 9.9* 10.6* 10.5*  HCT 31.3* 30.0*  31.9* 31.9*  PLT 93* 96* 116* 113*    COAGS: Recent Labs    09/22/23 0428  INR 1.1    BMP: Recent Labs    09/20/23 0553 09/21/23 0353 09/22/23 0428 09/23/23 0318  NA 138 135 137  137 136  K 3.8 4.1 4.0  3.9 4.6  CL 98 98 98  98 96*  CO2 27 26 26  25 26   GLUCOSE 93 95 130*  127* 91  BUN 37* 53* 56*  56* 68*  CALCIUM  8.3* 8.1* 8.0*  8.1* 8.3*  CREATININE 5.47* 7.67* 7.74*  7.75* 9.94*  GFRNONAA 10* 7* 6*  6* 5*    LIVER FUNCTION TESTS: Recent Labs    11/16/22 1731 01/31/23 1732 09/19/23 2125 09/21/23 0353 09/22/23 0428 09/23/23 0318  BILITOT 0.9 0.9 1.3*  --   --   --    AST 42* 45* 62*  --   --   --   ALT 38 49* 43  --   --   --   ALKPHOS 266* 272* 235*  --   --   --   PROT 7.1 6.9 7.0  --   --   --   ALBUMIN  3.3* 3.2* 3.1* 2.6* 2.7* 2.8*    Assessment and Plan:  Dr. Jenna attempted an embolization of the Type II endoleak without success. Right femoral access site without tenderness nor concerns, appropriately dressed. The patient will return for a direct puncture embolization with Onyx and general anesthesia. Pending scheduling.    Thank you for this interesting consult.  I greatly enjoyed meeting Ricky Harris and look forward to participating in their care.   Electronically Signed: Carlin DELENA Griffon, PA-C 09/23/2023, 2:22 PM     I spent a total of 15 Minutes at the the patient's bedside AND on the patient's hospital floor or unit, greater than 50% of which was counseling/coordinating care for multiple falls.

## 2023-09-23 NOTE — Plan of Care (Signed)
  Problem: Fluid Volume: Goal: Compliance with measures to maintain balanced fluid volume will improve Outcome: Progressing   

## 2023-09-23 NOTE — Plan of Care (Signed)
  Problem: Education: Goal: Knowledge of disease and its progression will improve Outcome: Progressing Goal: Individualized Educational Video(s) Outcome: Progressing   Problem: Fluid Volume: Goal: Compliance with measures to maintain balanced fluid volume will improve Outcome: Progressing   Problem: Health Behavior/Discharge Planning: Goal: Ability to manage health-related needs will improve Outcome: Progressing   Problem: Nutritional: Goal: Ability to make healthy dietary choices will improve Outcome: Progressing   Problem: Clinical Measurements: Goal: Complications related to the disease process, condition or treatment will be avoided or minimized Outcome: Progressing   Problem: Education: Goal: Knowledge of General Education information will improve Description: Including pain rating scale, medication(s)/side effects and non-pharmacologic comfort measures Outcome: Progressing   Problem: Health Behavior/Discharge Planning: Goal: Ability to manage health-related needs will improve Outcome: Progressing   Problem: Clinical Measurements: Goal: Ability to maintain clinical measurements within normal limits will improve Outcome: Progressing Goal: Will remain free from infection Outcome: Progressing Goal: Diagnostic test results will improve Outcome: Progressing Goal: Respiratory complications will improve Outcome: Progressing Goal: Cardiovascular complication will be avoided Outcome: Progressing   Problem: Activity: Goal: Risk for activity intolerance will decrease Outcome: Progressing   Problem: Nutrition: Goal: Adequate nutrition will be maintained Outcome: Progressing   Problem: Coping: Goal: Level of anxiety will decrease Outcome: Progressing   Problem: Elimination: Goal: Will not experience complications related to bowel motility Outcome: Progressing Goal: Will not experience complications related to urinary retention Outcome: Progressing   Problem: Pain  Managment: Goal: General experience of comfort will improve and/or be controlled Outcome: Progressing   Problem: Safety: Goal: Ability to remain free from injury will improve Outcome: Progressing   Problem: Skin Integrity: Goal: Risk for impaired skin integrity will decrease Outcome: Progressing

## 2023-09-23 NOTE — Progress Notes (Signed)
 [facility] Progress Note Patient: Ricky Harris FMW:[id number] DOB: [birth year] DOA: 09/19/2023  DOS: the patient was seen and examined on 09/23/2023  Brief Hospital Course: Patient with ESRD on HD TTS, dementia, paroxysmal A-fib, CAD, HFrEF, cirrhosis, hypothyroidism, AAA with repair with endoleak present to the hospital with complaints of passing out event with a fall.  Assessment and Plan: Recurrent fall Likely deconditioning with orthostasis. CT head, C spine, XR Pelvis, CXR without any acute traumatic injuries.  PT recommending SNF. Predialysis orthostatic vitals are stable.   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  Enlarging excluded aneurysm sac. Last imaging 12/'24 was 7.9 cm  now 9.9 cm on CT this admission. Vascular surgery was consulted.  Appreciate assistance. IR consulted.  Underwent IR guided procedure on 7/25.  Failed attempt at embolization of the endoleak.  Patient will require repeat procedure.  Will monitor in the hospital.   Elevated troponin. No hx chest pain. EKG with no acute ischemic change.  Troponin elevated in 50s. Most likely demand ischemia. Not a candidate for aggressive anticoagulation given endoleak.   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 No pneumonia  Stop antibiotics, no evidence of pneumonia   Dementia:  Continue home donepezil , Seroquel  at bedtime  ESRD on TTS HD:  Appreciate nephrology consultation. Monitor   Paroxysmal A-fib:  On Eliquis  for anticoagulation; hold in setting of enlarging aortic aneurysm sac  CAD known severe three-vessel disease, not felt to be a good candidate for CABG or PCI. On DOAC per above, currently held.  Continue home atorvastatin   Chronic HFrEF Significant aortic systolic  murmur Volume management with HD. Will update an echocardiogram given presentation with syncope.  Hx cirrhosis, chronic liver injury stable:  appears stable. Ammonia wnl   Hypothyroidism:  Continue home Levothyroxine .     Subjective: No nausea no vomiting no fever no chills.  No chest pain.  Physical Exam: Clear to auscultation. Aortic systolic murmur. S1-S2 present. Bowel sound present. Nontender. No edema of the lower extremity. No focal deficit.  Hard of hearing.  Data Reviewed: I have Reviewed nursing notes, Vitals, and Lab results. Since last encounter, pertinent lab results CBC and BMP   . I have ordered test including CBC and BMP  .  Ordered echocardiogram Disposition: Status is: Inpatient Remains inpatient appropriate because: Monitor for timing for the procedure.  Place and maintain sequential compression device Start: 09/20/23 0718   Family Communication: Son at bedside Level of care: Progressive   Vitals:   09/23/23 1538 09/23/23 1553 09/23/23 1608 09/23/23 1655  BP: 105/73 (!) 128/52 (!) 117/55 (!) 109/54  Pulse: 85 88 77 76  Resp: 17 19 19 18   Temp:   97.7 F (36.5 C) 97.7 F (36.5 C)  TempSrc:    Oral  SpO2: 97% 100% 96%   Weight:      Height:         Author: Yetta Blanch, MD 09/23/2023 5:49 PM  Please look on [email or web address] to find out who is on call.

## 2023-09-23 NOTE — Plan of Care (Signed)
  Problem: Health Behavior/Discharge Planning: Goal: Ability to manage health-related needs will improve Outcome: Progressing   Problem: Nutritional: Goal: Ability to make healthy dietary choices will improve Outcome: Progressing   Problem: Clinical Measurements: Goal: Ability to maintain clinical measurements within normal limits will improve Outcome: Progressing   Problem: Elimination: Goal: Will not experience complications related to bowel motility Outcome: Progressing

## 2023-09-23 NOTE — Progress Notes (Signed)
  [facility] Progress Note   Subjective:    Seen and examined patient in his room. Plan for HD today. BP is 121/60. He is comfortable and denies any issues. Followed by [facility]. 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 He is to return for a direct puncture embolization with Onyx and general anesthesia.  Objective Vitals:   09/22/23 1919 09/23/23 0019 09/23/23 0433 09/23/23 0800  BP: (!) 153/58 (!) 146/63 (!) 121/55 125/67  Pulse: 62 (!) 57 66 91  Resp: 19 14 20 20   Temp: 97.8 F (36.6 C) 98.4 F (36.9 C) 98.5 F (36.9 C) (!) 97.4 F (36.3 C)  TempSrc: Oral Oral Oral Oral  SpO2: 100% 91% 97% 99%  Weight:   75.9 kg   Height:       Physical Exam General: Elderly male; hard of hearing; Awake, NAD Heart: S1 and S2; + murmur; no gallops or rubs Lungs: Clear anteriorly Abdomen: Soft  Extremities:No LE edema Dialysis Access: AVF   Filed Weights   09/21/23 0946 09/21/23 1316 09/23/23 0433  Weight: 73.8 kg 71.8 kg 75.9 kg    Intake/Output Summary (Last 24 hours) at 09/23/2023 1152 Last data filed at 09/23/2023 0926 Gross per 24 hour  Intake 63 ml  Output --  Net 63 ml    Additional Objective Labs: Basic Metabolic Panel: Recent Labs  Lab 09/21/23 0353 09/22/23 0428 09/23/23 0318  NA 135 137  137 136  K 4.1 4.0  3.9 4.6  CL 98 98  98 96*  CO2 26 26  25 26   GLUCOSE 95 130*  127* 91  BUN 53* 56*  56* 68*  CREATININE 7.67* 7.74*  7.75* 9.94*  CALCIUM  8.1* 8.0*  8.1* 8.3*  PHOS 4.3 4.1 5.2*   Liver Function Tests: Recent Labs  Lab 09/19/23 2125 09/21/23 0353 09/22/23 0428 09/23/23 0318  AST 62*  --   --   --   ALT 43  --   --   --   ALKPHOS 235*  --   --   --   BILITOT 1.3*  --   --   --   PROT 7.0  --   --   --   ALBUMIN  3.1* 2.6* 2.7* 2.8*   No results for input(s): LIPASE, AMYLASE in the last 168 hours. CBC: Recent Labs  Lab 09/19/23 2125 09/19/23 2130  09/20/23 0553 09/21/23 0353 09/22/23 0428 09/23/23 0318  WBC 7.7  --  6.8 5.0 5.9 6.5  NEUTROABS 6.7  --   --  3.7 4.5 5.0  HGB 12.1*   < > 10.3* 9.9* 10.6* 10.5*  HCT 37.9*   < > 31.3* 30.0* 31.9* 31.9*  MCV 101.6*  --  101.3* 96.5 98.2 98.2  PLT 98*  --  93* 96* 116* 113*   < > = values in this interval not displayed.   Blood Culture    Component Value Date/Time   SDES BLOOD RIGHT HAND 07/06/2022 1925   SPECREQUEST  07/06/2022 1925    BOTTLES DRAWN AEROBIC ONLY Blood Culture results may not be optimal due to an inadequate volume of blood received in culture bottles   CULT  07/06/2022 1925    NO GROWTH 5 DAYS Performed at [facility], [address], [place], [place] [address]    REPTSTATUS 07/11/2022 FINAL 07/06/2022 1925    Cardiac Enzymes: No results for input(s): CKTOTAL, CKMB, CKMBINDEX, TROPONINI in the last  168 hours. CBG: Recent Labs  Lab 09/19/23 2129 09/21/23 1527 09/21/23 1731 09/23/23 0620  GLUCAP 107* 54* 169* 84   Iron Studies: No results for input(s): IRON, TIBC, TRANSFERRIN, FERRITIN in the last 72 hours. Lab Results  Component Value Date   INR 1.1 09/22/2023   INR 1.3 (H) 11/04/2021   INR 1.3 (H) 10/23/2021   Studies/Results: No results found.   Medications:    atorvastatin   40 mg Oral QHS   Chlorhexidine  Gluconate Cloth  6 each Topical Q0600   Chlorhexidine  Gluconate Cloth  6 each Topical Q0600   donepezil   10 mg Oral QHS   levothyroxine   50 mcg Oral Q0600   midodrine   10 mg Oral Q T,Th,Sat-1800   QUEtiapine   25 mg Oral QHS   sodium chloride  flush  3 mL Intravenous Q12H    Dialysis Orders: [facility] 3h  B350   74.8kg   AVF  Heparin  none Wt gains very small, dry wt being lowered Last HD 7/22, post wt 74.8kg  Home bp meds: Midodrine  10 mg pre-HD TTS Others: Lipitor , PhosLo , Aricept , Eliquis , T4, SL NTG, Seroquel , Ultram   Assessment/Plan: Multiple falls: deconditioning, multiple comorbidities. Per  pmd.  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 Next HD 09/23/23 BP: chronic hypotension on midodrine  pre HD TTS. Continue. 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 HFrEF Cirrhosis of liver CAD: 3V CAD, was not felt to be candidate for CABG or PCI  Belvie Och, NP [facility] 09/23/2023,11:52 AM  LOS: 2 days

## 2023-09-24 ENCOUNTER — Inpatient Hospital Stay (HOSPITAL_COMMUNITY)

## 2023-09-24 DIAGNOSIS — I9589 Other hypotension: Secondary | ICD-10-CM | POA: Diagnosis not present

## 2023-09-24 DIAGNOSIS — W1830XA Fall on same level, unspecified, initial encounter: Secondary | ICD-10-CM | POA: Diagnosis not present

## 2023-09-24 DIAGNOSIS — R55 Syncope and collapse: Secondary | ICD-10-CM | POA: Diagnosis not present

## 2023-09-24 DIAGNOSIS — E038 Other specified hypothyroidism: Secondary | ICD-10-CM | POA: Diagnosis not present

## 2023-09-24 LAB — ECHOCARDIOGRAM COMPLETE
AR max vel: 2.09 cm2
AV Area VTI: 2.36 cm2
AV Area mean vel: 2.19 cm2
AV Mean grad: 11.2 mmHg
AV Peak grad: 21.5 mmHg
Ao pk vel: 2.32 m/s
Area-P 1/2: 2.69 cm2
Height: 73 in
S' Lateral: 2.7 cm
Weight: 2641.99 [oz_av]

## 2023-09-24 LAB — CBC WITH DIFFERENTIAL/PLATELET
Abs Immature Granulocytes: 0.04 K/uL (ref 0.00–0.07)
Basophils Absolute: 0.1 K/uL (ref 0.0–0.1)
Basophils Relative: 1 %
Eosinophils Absolute: 0.1 K/uL (ref 0.0–0.5)
Eosinophils Relative: 2 %
HCT: 31.3 % — ABNORMAL LOW (ref 39.0–52.0)
Hemoglobin: 10.6 g/dL — ABNORMAL LOW (ref 13.0–17.0)
Immature Granulocytes: 1 %
Lymphocytes Relative: 12 %
Lymphs Abs: 0.8 K/uL (ref 0.7–4.0)
MCH: 32.8 pg (ref 26.0–34.0)
MCHC: 33.9 g/dL (ref 30.0–36.0)
MCV: 96.9 fL (ref 80.0–100.0)
Monocytes Absolute: 0.7 K/uL (ref 0.1–1.0)
Monocytes Relative: 10 %
Neutro Abs: 4.8 K/uL (ref 1.7–7.7)
Neutrophils Relative %: 74 %
Platelets: 133 K/uL — ABNORMAL LOW (ref 150–400)
RBC: 3.23 MIL/uL — ABNORMAL LOW (ref 4.22–5.81)
RDW: 14.8 % (ref 11.5–15.5)
WBC: 6.5 K/uL (ref 4.0–10.5)
nRBC: 0 % (ref 0.0–0.2)

## 2023-09-24 LAB — RENAL FUNCTION PANEL
Albumin: 2.9 g/dL — ABNORMAL LOW (ref 3.5–5.0)
Anion gap: 15 (ref 5–15)
BUN: 45 mg/dL — ABNORMAL HIGH (ref 8–23)
CO2: 27 mmol/L (ref 22–32)
Calcium: 8.2 mg/dL — ABNORMAL LOW (ref 8.9–10.3)
Chloride: 93 mmol/L — ABNORMAL LOW (ref 98–111)
Creatinine, Ser: 7.01 mg/dL — ABNORMAL HIGH (ref 0.61–1.24)
GFR, Estimated: 7 mL/min — ABNORMAL LOW (ref >60)
Glucose, Bld: 102 mg/dL — ABNORMAL HIGH (ref 70–99)
Phosphorus: 4 mg/dL (ref 2.5–4.6)
Potassium: 3.8 mmol/L (ref 3.5–5.1)
Sodium: 135 mmol/L (ref 135–145)

## 2023-09-24 LAB — MAGNESIUM: Magnesium: 1.9 mg/dL (ref 1.7–2.4)

## 2023-09-24 NOTE — Progress Notes (Signed)
 [facility] Progress Note Patient: Ricky Harris FMW:[id number] DOB: [birth year] DOA: 09/19/2023  DOS: the patient was seen and examined on 09/24/2023  Brief Hospital Course: Patient with ESRD on HD TTS, dementia, paroxysmal A-fib, CAD, HFrEF, cirrhosis, hypothyroidism, AAA with repair with endoleak present to the hospital with complaints of passing out event with a fall.  Currently being treated for endoleak from AAA.  Assessment and Plan: Recurrent fall Likely deconditioning with orthostasis. CT head, C spine, XR Pelvis, CXR without any acute traumatic injuries.  PT recommending SNF. Orthostatic vitals are stable. Continue midodrine  pre-HD. Echocardiogram shows preserved EF, mild to moderate aortic stenosis.  No other significant valvular abnormality. Telemetry negative for any acute events.   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  Enlarging excluded aneurysm sac. Last imaging 12/'24 was 7.9 cm  now 9.9 cm on CT this admission. Vascular surgery was consulted.  Appreciate assistance. IR consulted.   Underwent IR guided procedure on 7/25.  Failed attempt at embolization of the endoleak.   Patient will require repeat procedure.  Will monitor in the hospital.   Elevated troponin. No hx chest pain. EKG with no acute ischemic change.  Troponin elevated in 50s. Most likely demand ischemia. Not a candidate for aggressive anticoagulation given endoleak.   Aberrant right subclavian artery with calcification Right medial apical opacity on chest x-ray. Chest x-ray with a right apical opacity, followed by CT chest which shows an abberrant subclavian artery on the R possibly explaining findings. No pneumonia.   Dementia Continue home donepezil , Seroquel  at bedtime  ESRD on TTS HD:  Appreciate nephrology consultation. Monitor   Paroxysmal A-fib:  On Eliquis  for anticoagulation;  hold in setting of enlarging aortic aneurysm sac  CAD known severe three-vessel disease, not felt to be a good candidate for CABG or PCI. On DOAC per above, currently held.  Continue home atorvastatin   Chronic HFrEF Significant aortic systolic murmur Volume management with HD.  EF normal.  No significant stenosis.  Hx cirrhosis, chronic liver injury stable:  appears stable. Ammonia wnl   Hypothyroidism:  Continue home Levothyroxine .     Subjective: Denies any acute complaint.  No nausea vomiting fevers or chills.  Physical Exam: Patient clear to auscultation concerning her systolic murmur heard. Bowel sound present. No edema. No focal deficit.  Data Reviewed: I have Reviewed nursing notes, Vitals, and Lab results. Since last encounter, pertinent lab results CBC and BMP   . I have ordered test including CBC and BMP  .   Disposition: Status is: Inpatient Remains inpatient appropriate because: Monitor for timing of endoleak repair.  Place and maintain sequential compression device Start: 09/20/23 0718   Family Communication: No one at bedside.  Discussed with son in 7/26. Level of care: Progressive   Vitals:   09/24/23 0500 09/24/23 0721 09/24/23 1029 09/24/23 1537  BP:  (!) 109/52 (!) 114/47 107/67  Pulse:  69 71 84  Resp:  17 17 14   Temp:  98.2 F (36.8 C) 98 F (36.7 C) 98.2 F (36.8 C)  TempSrc:  Oral Oral Oral  SpO2:  95% 96% 96%  Weight: 74.9 kg     Height:         Author: Yetta Blanch, MD 09/24/2023 6:28 PM  Please look on [email or web address] to find out who is on call.

## 2023-09-24 NOTE — Plan of Care (Signed)
  Problem: Fluid Volume: Goal: Compliance with measures to maintain balanced fluid volume will improve Outcome: Progressing   Problem: Health Behavior/Discharge Planning: Goal: Ability to manage health-related needs will improve Outcome: Progressing   Problem: Clinical Measurements: Goal: Ability to maintain clinical measurements within normal limits will improve Outcome: Progressing   Problem: Safety: Goal: Ability to remain free from injury will improve Outcome: Progressing

## 2023-09-24 NOTE — Progress Notes (Signed)
 [facility] Progress Note   Subjective:    Seen and examined patient in his room. His son was in the room visiting him today. Had HD 09/23/23 with 1.5L UF. BP is 114/47. He is comfortable and denies any issues. Followed by [facility]. 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 Updated echo showed 55-60%. They may keep him here for a direct puncture embolization with Onyx and general anesthesia.  Objective Vitals:   09/24/23 0351 09/24/23 0500 09/24/23 0721 09/24/23 1029  BP: (!) 98/48  (!) 109/52 (!) 114/47  Pulse: 83  69 71  Resp: 17  17 17   Temp: 98.2 F (36.8 C)  98.2 F (36.8 C) 98 F (36.7 C)  TempSrc: Oral  Oral Oral  SpO2: 91%  95% 96%  Weight:  74.9 kg    Height:       Physical Exam General: Elderly male; hard of hearing; Awake, NAD Heart: S1 and S2; + murmur; no gallops or rubs Lungs: Clear anteriorly Abdomen: Soft  Extremities:No LE edema Dialysis Access: AVF   Filed Weights   09/23/23 0433 09/23/23 1307 09/24/23 0500  Weight: 75.9 kg 76.2 kg 74.9 kg    Intake/Output Summary (Last 24 hours) at 09/24/2023 1305 Last data filed at 09/24/2023 0926 Gross per 24 hour  Intake 243 ml  Output 1500 ml  Net -1257 ml    Additional Objective Labs: Basic Metabolic Panel: Recent Labs  Lab 09/22/23 0428 09/23/23 0318 09/24/23 0327  NA 137  137 136 135  K 4.0  3.9 4.6 3.8  CL 98  98 96* 93*  CO2 26  25 26 27   GLUCOSE 130*  127* 91 102*  BUN 56*  56* 68* 45*  CREATININE 7.74*  7.75* 9.94* 7.01*  CALCIUM  8.0*  8.1* 8.3* 8.2*  PHOS 4.1 5.2* 4.0   Liver Function Tests: Recent Labs  Lab 09/19/23 2125 09/21/23 0353 09/22/23 0428 09/23/23 0318 09/24/23 0327  AST 62*  --   --   --   --   ALT 43  --   --   --   --   ALKPHOS 235*  --   --   --   --   BILITOT 1.3*  --   --   --   --   PROT 7.0  --   --   --   --   ALBUMIN  3.1*   < > 2.7* 2.8* 2.9*   < > = values in this interval  not displayed.   No results for input(s): LIPASE, AMYLASE in the last 168 hours. CBC: Recent Labs  Lab 09/20/23 0553 09/21/23 0353 09/22/23 0428 09/23/23 0318 09/24/23 0327  WBC 6.8 5.0 5.9 6.5 6.5  NEUTROABS  --  3.7 4.5 5.0 4.8  HGB 10.3* 9.9* 10.6* 10.5* 10.6*  HCT 31.3* 30.0* 31.9* 31.9* 31.3*  MCV 101.3* 96.5 98.2 98.2 96.9  PLT 93* 96* 116* 113* 133*    CBG: Recent Labs  Lab 09/19/23 2129 09/21/23 1527 09/21/23 1731 09/23/23 0620  GLUCAP 107* 54* 169* 84    Lab Results  Component Value Date   INR 1.1 09/22/2023   INR 1.3 (H) 11/04/2021   INR 1.3 (H) 10/23/2021    Medications:    atorvastatin   40 mg Oral QHS   Chlorhexidine  Gluconate Cloth  6 each Topical Q0600   Chlorhexidine  Gluconate Cloth  6 each Topical Q0600   donepezil   10 mg Oral  QHS   levothyroxine   50 mcg Oral Q0600   midodrine   10 mg Oral Q T,Th,Sat-1800   QUEtiapine   25 mg Oral QHS   sodium chloride  flush  3 mL Intravenous Q12H    Dialysis Orders: [facility] TTS 3h  B350   74.8kg   AVF  Heparin  none Wt gains very small, dry wt being lowered Last HD 7/22, post wt 74.8kg  Home bp meds: Midodrine  10 mg pre-HD TTS Others: Lipitor , PhosLo , Aricept , Eliquis , T4, SL NTG, Seroquel , Ultram   Assessment/Plan: Multiple falls: deconditioning, multiple comorbidities. Per pmd.  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 Next HD 09/23/23 BP: chronic hypotension on midodrine  pre HD TTS. Continue. 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 HFrEF Cirrhosis of liver CAD: 3V CAD, was not felt to be candidate for CABG or PCI  Belvie Och, NP [facility] 09/24/2023,1:05 PM  LOS: 3 days

## 2023-09-24 NOTE — Progress Notes (Signed)
 Echocardiogram 2D Echocardiogram has been performed.  Ramaya Guile N Abbe Bula,RDCS 09/24/2023, 8:36 AM

## 2023-09-25 DIAGNOSIS — W1830XA Fall on same level, unspecified, initial encounter: Secondary | ICD-10-CM | POA: Diagnosis not present

## 2023-09-25 DIAGNOSIS — E038 Other specified hypothyroidism: Secondary | ICD-10-CM | POA: Diagnosis not present

## 2023-09-25 DIAGNOSIS — I9589 Other hypotension: Secondary | ICD-10-CM | POA: Diagnosis not present

## 2023-09-25 LAB — RENAL FUNCTION PANEL
Albumin: 2.9 g/dL — ABNORMAL LOW (ref 3.5–5.0)
Anion gap: 15 (ref 5–15)
BUN: 64 mg/dL — ABNORMAL HIGH (ref 8–23)
CO2: 24 mmol/L (ref 22–32)
Calcium: 8 mg/dL — ABNORMAL LOW (ref 8.9–10.3)
Chloride: 92 mmol/L — ABNORMAL LOW (ref 98–111)
Creatinine, Ser: 9.2 mg/dL — ABNORMAL HIGH (ref 0.61–1.24)
GFR, Estimated: 5 mL/min — ABNORMAL LOW (ref >60)
Glucose, Bld: 92 mg/dL (ref 70–99)
Phosphorus: 4.5 mg/dL (ref 2.5–4.6)
Potassium: 4.1 mmol/L (ref 3.5–5.1)
Sodium: 131 mmol/L — ABNORMAL LOW (ref 135–145)

## 2023-09-25 LAB — CBC WITH DIFFERENTIAL/PLATELET
Abs Immature Granulocytes: 0.03 K/uL (ref 0.00–0.07)
Basophils Absolute: 0 K/uL (ref 0.0–0.1)
Basophils Relative: 1 %
Eosinophils Absolute: 0.1 K/uL (ref 0.0–0.5)
Eosinophils Relative: 2 %
HCT: 29.7 % — ABNORMAL LOW (ref 39.0–52.0)
Hemoglobin: 10 g/dL — ABNORMAL LOW (ref 13.0–17.0)
Immature Granulocytes: 0 %
Lymphocytes Relative: 12 %
Lymphs Abs: 0.9 K/uL (ref 0.7–4.0)
MCH: 32.6 pg (ref 26.0–34.0)
MCHC: 33.7 g/dL (ref 30.0–36.0)
MCV: 96.7 fL (ref 80.0–100.0)
Monocytes Absolute: 0.6 K/uL (ref 0.1–1.0)
Monocytes Relative: 8 %
Neutro Abs: 5.6 K/uL (ref 1.7–7.7)
Neutrophils Relative %: 77 %
Platelets: 121 K/uL — ABNORMAL LOW (ref 150–400)
RBC: 3.07 MIL/uL — ABNORMAL LOW (ref 4.22–5.81)
RDW: 14.9 % (ref 11.5–15.5)
WBC: 7.3 K/uL (ref 4.0–10.5)
nRBC: 0 % (ref 0.0–0.2)

## 2023-09-25 LAB — MAGNESIUM: Magnesium: 2 mg/dL (ref 1.7–2.4)

## 2023-09-25 MED ORDER — APIXABAN 2.5 MG PO TABS
2.5000 mg | ORAL_TABLET | Freq: Two times a day (BID) | ORAL | Status: DC
  Administered 2023-09-25 – 2023-09-26 (×2): 2.5 mg via ORAL
  Filled 2023-09-25 (×2): qty 1

## 2023-09-25 NOTE — Progress Notes (Signed)
 Contacted [facility] to be advised that pt remains hospitalized. Will update clinic on d/c date once known. Contacted nephrologist and renal NP to be advised that clinic can accommodate a MWF HD appt at d/c due to snf placement. Will assist as needed.   Randine Mungo Dialysis Navigator [phone]

## 2023-09-25 NOTE — Progress Notes (Signed)
 [facility] Progress Note   Subjective:    Patient states he has some chest fullness that corresponds to his pain on the left side of his chest today.  Otherwise denies any complaints  Objective Vitals:   09/25/23 0200 09/25/23 0525 09/25/23 0530 09/25/23 0730  BP:  110/65  (!) 115/52  Pulse: 75 74 83 68  Resp: 18 15 18 17   Temp:    97.8 F (36.6 C)  TempSrc:    Oral  SpO2: 97% 96% 99% 100%  Weight:      Height:       Physical Exam General: Elderly male; hard of hearing; Awake, NAD Heart: Normal rate, no rub Lungs: Bilateral chest rise with no increased work of breathing Abdomen: Soft, nondistended Extremities:No LE edema, warm and well-perfused Dialysis Access: AVF   Filed Weights   09/23/23 0433 09/23/23 1307 09/24/23 0500  Weight: 75.9 kg 76.2 kg 74.9 kg    Intake/Output Summary (Last 24 hours) at 09/25/2023 1007 Last data filed at 09/25/2023 0849 Gross per 24 hour  Intake 63 ml  Output --  Net 63 ml    Additional Objective Labs: Basic Metabolic Panel: Recent Labs  Lab 09/23/23 0318 09/24/23 0327 09/25/23 0305  NA 136 135 131*  K 4.6 3.8 4.1  CL 96* 93* 92*  CO2 26 27 24   GLUCOSE 91 102* 92  BUN 68* 45* 64*  CREATININE 9.94* 7.01* 9.20*  CALCIUM  8.3* 8.2* 8.0*  PHOS 5.2* 4.0 4.5   Liver Function Tests: Recent Labs  Lab 09/19/23 2125 09/21/23 0353 09/23/23 0318 09/24/23 0327 09/25/23 0305  AST 62*  --   --   --   --   ALT 43  --   --   --   --   ALKPHOS 235*  --   --   --   --   BILITOT 1.3*  --   --   --   --   PROT 7.0  --   --   --   --   ALBUMIN  3.1*   < > 2.8* 2.9* 2.9*   < > = values in this interval not displayed.   No results for input(s): LIPASE, AMYLASE in the last 168 hours. CBC: Recent Labs  Lab 09/21/23 0353 09/22/23 0428 09/23/23 0318 09/24/23 0327 09/25/23 0305  WBC 5.0 5.9 6.5 6.5 7.3  NEUTROABS 3.7 4.5 5.0 4.8 5.6  HGB 9.9* 10.6* 10.5* 10.6* 10.0*  HCT 30.0* 31.9* 31.9* 31.3* 29.7*  MCV 96.5  98.2 98.2 96.9 96.7  PLT 96* 116* 113* 133* 121*    CBG: Recent Labs  Lab 09/19/23 2129 09/21/23 1527 09/21/23 1731 09/23/23 0620  GLUCAP 107* 54* 169* 84    Lab Results  Component Value Date   INR 1.1 09/22/2023   INR 1.3 (H) 11/04/2021   INR 1.3 (H) 10/23/2021    Medications:    atorvastatin   40 mg Oral QHS   Chlorhexidine  Gluconate Cloth  6 each Topical Q0600   Chlorhexidine  Gluconate Cloth  6 each Topical Q0600   donepezil   10 mg Oral QHS   levothyroxine   50 mcg Oral Q0600   midodrine   10 mg Oral Q T,Th,Sat-1800   QUEtiapine   25 mg Oral QHS   sodium chloride  flush  3 mL Intravenous Q12H    Dialysis Orders: [facility] TTS 3h  B350   74.8kg   AVF  Heparin  none Wt gains very small, dry wt being lowered Last HD 7/22, post wt 74.8kg  Home bp meds: Midodrine  10 mg pre-HD TTS Others: Lipitor , PhosLo , Aricept , Eliquis , T4, SL NTG, Seroquel , Ultram   Assessment/Plan: Multiple falls: deconditioning, multiple comorbidities. Per pmd.  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  Continue dialysis per schedule BP: chronic hypotension on midodrine  pre HD TTS. Continue. 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 Unsuccessful embolization of type 2 endoleak with IR.  Continue discussions of available options HFrEF Cirrhosis of liver CAD: 3V CAD, was not felt to be candidate for CABG or PCI Goals of care: Significant chronic illnesses.  Recommend palliative care   [facility] 09/25/2023,10:07 AM  LOS: 4 days

## 2023-09-25 NOTE — TOC Progression Note (Addendum)
 Transition of Care [facility]) - Progression Note    Patient Details  Name: Ricky Harris MRN: [id number] Date of Birth: 1940/03/06  Transition of Care [facility] CM/SW Contact  Luise JAYSON Pan, [place] Phone Number: 09/25/2023, 11:14 AM  Clinical Narrative:   CSW notified Marinda Raddle that patient is not medically ready for discharge but that pre authorization for SNF has been approved. Marinda Raddle expressed his understanding and stated he will update his brother. Marinda Raddle would like for CSW to go through his brother as, hisself is not local to [place].   Per MD, awaiting IR procedure before patient can DC. At this time, there is no anticipated date/time for procedure.   CSW called [employer] with navi to inquire about when SNF auth expires. Per mayme barrows expires 7/30 (due to [facility] 24 hour grace period policy) before another authorization is needed. CSW notified Fairy with admissions at [facility].   4:13 PM Per MD, IR procedure will be done outpatient. Patient can DC to SNF when SNF is ready. CSW hollace Fairy with admissions.   TOC will continue to follow.    Expected Discharge Plan: Skilled Nursing Facility Barriers to Discharge: English as a second language teacher, Continued Medical Work up, No SNF bed               Expected Discharge Plan and Services       Living arrangements for the past 2 months: Single Family Home                                       Social Drivers of Health (SDOH) Interventions SDOH Screenings   Food Insecurity: No Food Insecurity (09/20/2023)  Housing: Low Risk  (09/20/2023)  Transportation Needs: No Transportation Needs (09/20/2023)  Utilities: Not At Risk (09/20/2023)  Social Connections: Unknown (09/20/2023)  Tobacco Use: Low Risk  (09/19/2023)    Readmission Risk Interventions     No data to display

## 2023-09-25 NOTE — Progress Notes (Signed)
 [facility] Progress Note Patient: Ricky Harris FMW:[id number] DOB: [birth year] DOA: 09/19/2023  DOS: the patient was seen and examined on 09/25/2023  Brief Hospital Course: Patient with ESRD on HD TTS, dementia, paroxysmal A-fib, CAD, HFrEF, cirrhosis, hypothyroidism, AAA with repair with endoleak present to the hospital with complaints of passing out event with a fall.  So far workup for syncope is negative here in the hospital. Also found to have worsening chronic endoleak for which vascular surgery and IR were consulted.  Assessment and Plan: Recurrent fall Likely deconditioning with orthostasis. CT head, C spine, XR Pelvis, CXR without any acute traumatic injuries.  PT recommending SNF. Orthostatic vitals are stable. Continue midodrine  pre-HD. Echocardiogram shows preserved EF, mild to moderate aortic stenosis.  No other significant valvular abnormality. Telemetry negative for any acute events.   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  Enlarging excluded aneurysm sac. Last imaging 12/'24 was 7.9 cm  now 9.9 cm on CT this admission. Vascular surgery was consulted.  Appreciate assistance. IR consulted.   Underwent IR guided procedure on 7/25.  Failed attempt at embolization of the endoleak.   Discussed with IR Dr. Jenna on 7/28.  Patient will need direct puncture embolization with Onyx and general anesthesia  IR recommending to schedule this procedure outpatient right now. Discussed with vascular surgery as well as IR, okay to resume anticoagulation with Eliquis .   Elevated troponin. No hx chest pain. EKG with no acute ischemic change.  Troponin elevated in 50s. Most likely demand ischemia. Not a candidate for aggressive anticoagulation given endoleak.   Aberrant right subclavian artery with calcification Right medial apical opacity on chest x-ray. Chest x-ray with a  right apical opacity, followed by CT chest which shows an abberrant subclavian artery on the R possibly explaining findings. No pneumonia.   Dementia Continue home donepezil , Seroquel  at bedtime  ESRD on TTS HD:  Appreciate nephrology consultation. To accommodate discharge to SNF, case TTS schedule is now switch to MWF outpatient.  Paroxysmal A-fib:  On Eliquis  for anticoagulation; held in setting of enlarging aortic aneurysm sac.  Resume tonight.  CAD known severe three-vessel disease, not felt to be a good candidate for CABG or PCI. On DOAC per above, currently held.  Continue home atorvastatin   Chronic HFrEF Significant aortic systolic murmur Volume management with HD.  EF normal.  No significant stenosis.  Hx cirrhosis, chronic liver injury stable:  appears stable. Ammonia wnl   Hypothyroidism:  Continue home Levothyroxine .     Subjective: No nausea no vomiting no fever no chills.  No chest pain.  No shortness of breath.  Physical Exam: Clear to auscultation. No crackles. S1-S2 present.  Aortic systolic murmur. Bowels are present. Nontender on exam. No edema. No focal deficit.  Data Reviewed: I have Reviewed nursing notes, Vitals, and Lab results. Since last encounter, pertinent lab results CBC and BMP   . I have ordered test including CBC and BMP  . I have discussed pt's care plan and test results with discussed with nephrology, vascular surgery, interventional radiology  .   Disposition: Status is: Inpatient Remains inpatient appropriate because: Discharge tomorrow after HD  apixaban  (ELIQUIS ) tablet 2.5 mg Start: 09/25/23 2200 Place and maintain sequential compression device Start: 09/20/23 0718 apixaban  (ELIQUIS ) tablet 2.5 mg   Family Communication: Discussed with son at bedside Level of care: Progressive switch to telemetry Vitals:   09/25/23 0525  09/25/23 0530 09/25/23 0730 09/25/23 1145  BP: 110/65  (!) 115/52 (!) 127/59  Pulse: 74 83 68 75  Resp: 15  18 17 19   Temp:   97.8 F (36.6 C) 97.8 F (36.6 C)  TempSrc:   Oral Oral  SpO2: 96% 99% 100% 98%  Weight:      Height:         Author: Yetta Blanch, MD 09/25/2023 5:04 PM  Please look on [email or web address] to find out who is on call.

## 2023-09-25 NOTE — Plan of Care (Signed)
  Problem: Fluid Volume: Goal: Compliance with measures to maintain balanced fluid volume will improve Outcome: Progressing   Problem: Clinical Measurements: Goal: Ability to maintain clinical measurements within normal limits will improve Outcome: Progressing Goal: Cardiovascular complication will be avoided Outcome: Progressing   Problem: Nutrition: Goal: Adequate nutrition will be maintained Outcome: Progressing

## 2023-09-25 NOTE — Progress Notes (Signed)
 Physical Therapy Treatment Patient Details Name: Ricky Harris MRN: [id number] DOB: 1941-02-05 Today's Date: 09/25/2023   History of Present Illness 83 y.o. male adm 09/19/23 with multiple falls/syncope. 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 Pt with acute MI and enlarging AAA sac. 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 PMHx: dementia, ESRD on TTS HD, PAF, CAD, HFrEF, cirrhosis, hypothyroidism, anemia, thrombocytopenia, orthostatic hypotension, AAA s/p EVAR    PT Comments  Pt pleasant with decreased orientation and slow processing. Pt normally walks and lives with family but currently limited by stool incontinence with all standing trials, fatigue and physical assist for all transfers. Pt educated for seated HEP, transfers and activity progression. Will continue to follow. Patient will benefit from continued inpatient follow up therapy, <3 hours/day   BP 116/74 (80) sitting HR 73 SPO2 97% on RA    If plan is discharge home, recommend the following: A lot of help with walking and/or transfers;A lot of help with bathing/dressing/bathroom;Assistance with cooking/housework;Assist for transportation;Help with stairs or ramp for entrance;Supervision due to cognitive status;Direct supervision/assist for financial management;Direct supervision/assist for medications management   Can travel by private vehicle     No  Equipment Recommendations  None recommended by PT    Recommendations for Other Services       Precautions / Restrictions Precautions Precautions: Fall;Other (comment) Recall of Precautions/Restrictions: Impaired Precaution/Restrictions Comments: incontinent stool, check BP     Mobility  Bed Mobility Overal bed mobility: Needs Assistance Bed Mobility: Rolling, Sidelying to Sit Rolling: Min assist Sidelying to sit: Min assist, Used rails       General bed mobility comments: min assist to roll bil with total assist for pericare due to  incontinent stool. side to sit with min assist, increased time and cues for sequence    Transfers Overall transfer level: Needs assistance   Transfers: Sit to/from Stand, Bed to chair/wheelchair/BSC Sit to Stand: Min assist Stand pivot transfers: Min assist         General transfer comment: min assist to rise from with stool incontinence in standing and pivot to chair with Min assist and RW. stood 2x from recliner with cues for hand placement and assist to power up from surface for pericare with continued stool incontinence. Pt fatigues quickly in standing and unable to tolerate >30sec at a time on his feet    Ambulation/Gait               General Gait Details: not yet able   Stairs             Wheelchair Mobility     Tilt Bed    Modified Rankin (Stroke Patients Only)       Balance Overall balance assessment: Needs assistance Sitting-balance support: No upper extremity supported, Feet supported Sitting balance-Leahy Scale: Fair Sitting balance - Comments: EOB with supervision   Standing balance support: Bilateral upper extremity supported, During functional activity, Reliant on assistive device for balance Standing balance-Leahy Scale: Poor Standing balance comment: Rw in standing                            Communication Communication Communication: Impaired Factors Affecting Communication: Hearing impaired  Cognition Arousal: Alert Behavior During Therapy: Flat affect   PT - Cognitive impairments: No family/caregiver present to determine baseline, Sequencing, Problem solving, Safety/Judgement  PT - Cognition Comments: cues for all mobility, decreased initiation, no awareness of stool incontinence Following commands: Impaired Following commands impaired: Follows one step commands with increased time    Cueing Cueing Techniques: Verbal cues, Gestural cues  Exercises General Exercises - Lower  Extremity Long Arc Quad: AROM, Both, 15 reps, Seated Hip Flexion/Marching: AROM, Both, Seated, 10 reps    General Comments        Pertinent Vitals/Pain Pain Assessment Pain Assessment: No/denies pain    Home Living                          Prior Function            PT Goals (current goals can now be found in the care plan section) Progress towards PT goals: Progressing toward goals    Frequency    Min 2X/week      PT Plan      Co-evaluation              AM-PAC PT 6 Clicks Mobility   Outcome Measure  Help needed turning from your back to your side while in a flat bed without using bedrails?: A Little Help needed moving from lying on your back to sitting on the side of a flat bed without using bedrails?: A Little Help needed moving to and from a bed to a chair (including a wheelchair)?: A Little Help needed standing up from a chair using your arms (e.g., wheelchair or bedside chair)?: A Little Help needed to walk in hospital room?: Total Help needed climbing 3-5 steps with a railing? : Total 6 Click Score: 14    End of Session Equipment Utilized During Treatment: Gait belt Activity Tolerance: Patient tolerated treatment well Patient left: in chair;with call bell/phone within reach;with chair alarm set Nurse Communication: Mobility status PT Visit Diagnosis: Unsteadiness on feet (R26.81);Other abnormalities of gait and mobility (R26.89);Muscle weakness (generalized) (M62.81);History of falling (Z91.81)     Time: 8854-8784 PT Time Calculation (min) (ACUTE ONLY): 30 min  Charges:    $Therapeutic Activity: 23-37 mins PT General Charges $$ ACUTE PT VISIT: 1 Visit                     Ricky Harris, PT Acute Rehabilitation Services Office: [phone]    Ricky Harris 09/25/2023, 1:09 PM

## 2023-09-26 ENCOUNTER — Other Ambulatory Visit (HOSPITAL_COMMUNITY): Payer: Self-pay

## 2023-09-26 ENCOUNTER — Encounter (HOSPITAL_COMMUNITY): Payer: Self-pay | Admitting: Internal Medicine

## 2023-09-26 ENCOUNTER — Encounter (HOSPITAL_COMMUNITY): Payer: Self-pay

## 2023-09-26 DIAGNOSIS — E038 Other specified hypothyroidism: Secondary | ICD-10-CM | POA: Diagnosis not present

## 2023-09-26 DIAGNOSIS — W1830XA Fall on same level, unspecified, initial encounter: Secondary | ICD-10-CM | POA: Diagnosis not present

## 2023-09-26 LAB — RENAL FUNCTION PANEL
Albumin: 2.8 g/dL — ABNORMAL LOW (ref 3.5–5.0)
Anion gap: 16 — ABNORMAL HIGH (ref 5–15)
BUN: 87 mg/dL — ABNORMAL HIGH (ref 8–23)
CO2: 22 mmol/L (ref 22–32)
Calcium: 8 mg/dL — ABNORMAL LOW (ref 8.9–10.3)
Chloride: 93 mmol/L — ABNORMAL LOW (ref 98–111)
Creatinine, Ser: 11.49 mg/dL — ABNORMAL HIGH (ref 0.61–1.24)
GFR, Estimated: 4 mL/min — ABNORMAL LOW (ref >60)
Glucose, Bld: 115 mg/dL — ABNORMAL HIGH (ref 70–99)
Phosphorus: 5.2 mg/dL — ABNORMAL HIGH (ref 2.5–4.6)
Potassium: 3.8 mmol/L (ref 3.5–5.1)
Sodium: 131 mmol/L — ABNORMAL LOW (ref 135–145)

## 2023-09-26 LAB — CBC
HCT: 28 % — ABNORMAL LOW (ref 39.0–52.0)
Hemoglobin: 9.4 g/dL — ABNORMAL LOW (ref 13.0–17.0)
MCH: 32.6 pg (ref 26.0–34.0)
MCHC: 33.6 g/dL (ref 30.0–36.0)
MCV: 97.2 fL (ref 80.0–100.0)
Platelets: 111 K/uL — ABNORMAL LOW (ref 150–400)
RBC: 2.88 MIL/uL — ABNORMAL LOW (ref 4.22–5.81)
RDW: 15 % (ref 11.5–15.5)
WBC: 6.6 K/uL (ref 4.0–10.5)
nRBC: 0 % (ref 0.0–0.2)

## 2023-09-26 MED ORDER — MIDODRINE HCL 10 MG PO TABS: 10.0000 mg | ORAL_TABLET | ORAL | Status: AC

## 2023-09-26 MED ORDER — POLYETHYLENE GLYCOL 3350 17 GM/SCOOP PO POWD
17.0000 g | Freq: Every day | ORAL | 0 refills | Status: AC | PRN
  Filled 2023-09-26: qty 238, 14d supply, fill #0

## 2023-09-26 MED ORDER — HYDROCODONE-ACETAMINOPHEN 5-325 MG PO TABS: 1.0000 | ORAL_TABLET | Freq: Three times a day (TID) | ORAL | 0 refills | Status: AC | PRN

## 2023-09-26 NOTE — Progress Notes (Signed)
 Physical Therapy Treatment Patient Details Name: Ricky Harris MRN: [id number] DOB: [birth year] Today's Date: 09/26/2023   History of Present Illness 83 y.o. male adm 09/19/23 with multiple falls/syncope. 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 Pt with acute MI and enlarging AAA sac. 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 PMHx: dementia, ESRD on TTS HD, PAF, CAD, HFrEF, cirrhosis, hypothyroidism, anemia, thrombocytopenia, orthostatic hypotension, AAA s/p EVAR    PT Comments  The pt was agreeable to session after HD to progress functional strength and mobility. He was again found to be soiled of BM and unaware, so beginning of session spent working on bed mobility and rolling for cleaning. Pt completed with minA and use of rails and VSS. Once pt assisted to EOB, he reports on set of dizziness (BP remained stable), and required increased assist to maintain static sitting and modA to manage partial sit-stand (pt maintained hip and trunk flexion despite cues). Given significant need for assist, continue to recommend post-acute therapies to progress strength, power, and independence prior to anticipated return home.     If plan is discharge home, recommend the following: A lot of help with walking and/or transfers;A lot of help with bathing/dressing/bathroom;Assistance with cooking/housework;Assist for transportation;Help with stairs or ramp for entrance;Supervision due to cognitive status;Direct supervision/assist for financial management;Direct supervision/assist for medications management   Can travel by private vehicle     No  Equipment Recommendations  None recommended by PT    Recommendations for Other Services       Precautions / Restrictions Precautions Precautions: Fall;Other (comment) Recall of Precautions/Restrictions: Impaired Precaution/Restrictions Comments: incontinent stool, check BP Restrictions Weight Bearing Restrictions Per Provider Order:  No     Mobility  Bed Mobility Overal bed mobility: Needs Assistance Bed Mobility: Rolling, Sidelying to Sit, Sit to Supine Rolling: Min assist, Used rails Sidelying to sit: Min assist, Used rails   Sit to supine: Mod assist   General bed mobility comments: minA to complete rolling for cleaning and donning brief, pt able to assist with UE on rail, minA to elevate trunk to sitting and to mange LE return to bed    Transfers Overall transfer level: Needs assistance Equipment used: Rolling walker (2 wheels) Transfers: Sit to/from Stand Sit to Stand: Mod assist           General transfer comment: modA to rise to standing, unable to achieve full upright due to pt flexion at trunk despite cues    Ambulation/Gait               General Gait Details: pt unable due to dizziness (BP stable) and fatigue   Stairs             Wheelchair Mobility     Tilt Bed    Modified Rankin (Stroke Patients Only)       Balance Overall balance assessment: Needs assistance Sitting-balance support: No upper extremity supported, Feet supported Sitting balance-Leahy Scale: Fair Sitting balance - Comments: EOB with supervision Postural control:  (forwards lean) Standing balance support: Bilateral upper extremity supported, During functional activity, Reliant on assistive device for balance Standing balance-Leahy Scale: Poor Standing balance comment: RW and modA in standing, lmiited to 3-5 seconds                            Communication Communication Communication: Impaired Factors Affecting Communication: Hearing impaired  Cognition Arousal: Alert Behavior During Therapy: Flat affect   PT - Cognitive impairments:  No family/caregiver present to determine baseline, Sequencing, Problem solving, Safety/Judgement                       PT - Cognition Comments: cues for all mobility, decreased initiation, no awareness of stool incontinence Following commands:  Impaired Following commands impaired: Follows one step commands with increased time    Cueing Cueing Techniques: Verbal cues, Gestural cues  Exercises      General Comments General comments (skin integrity, edema, etc.): VSS, pt reports dizziness with standing      Pertinent Vitals/Pain Pain Assessment Pain Assessment: No/denies pain Faces Pain Scale: Hurts a little bit Pain Location: generalized Pain Descriptors / Indicators: Discomfort Pain Intervention(s): Limited activity within patient's tolerance, Monitored during session, Repositioned    Home Living                          Prior Function            PT Goals (current goals can now be found in the care plan section) Acute Rehab PT Goals Patient Stated Goal: to get stronger PT Goal Formulation: With patient Time For Goal Achievement: 10/04/23 Potential to Achieve Goals: Good Progress towards PT goals: Progressing toward goals    Frequency    Min 2X/week      PT Plan      Co-evaluation              AM-PAC PT 6 Clicks Mobility   Outcome Measure  Help needed turning from your back to your side while in a flat bed without using bedrails?: A Little Help needed moving from lying on your back to sitting on the side of a flat bed without using bedrails?: A Little Help needed moving to and from a bed to a chair (including a wheelchair)?: A Lot Help needed standing up from a chair using your arms (e.g., wheelchair or bedside chair)?: A Lot Help needed to walk in hospital room?: Total Help needed climbing 3-5 steps with a railing? : Total 6 Click Score: 12    End of Session Equipment Utilized During Treatment: Gait belt Activity Tolerance: Patient tolerated treatment well Patient left: in bed;with call bell/phone within reach;with nursing/sitter in room Nurse Communication: Mobility status PT Visit Diagnosis: Unsteadiness on feet (R26.81);Other abnormalities of gait and mobility  (R26.89);Muscle weakness (generalized) (M62.81);History of falling (Z91.81)     Time: 8569-8541 PT Time Calculation (min) (ACUTE ONLY): 28 min  Charges:    $Therapeutic Exercise: 8-22 mins $Therapeutic Activity: 8-22 mins PT General Charges $$ ACUTE PT VISIT: 1 Visit                     Izetta Call, PT, DPT   Acute Rehabilitation Department Office [phone] Secure Chat Communication Preferred    Izetta JULIANNA Call 09/26/2023, 3:11 PM

## 2023-09-26 NOTE — Procedures (Signed)
 I was present at this dialysis session. I have reviewed the session itself and made appropriate changes.   Patient had no complaints.  Tolerating dialysis.  Likely discharge today.  Filed Weights   09/24/23 0500 09/26/23 0422 09/26/23 0836  Weight: 74.9 kg 73.3 kg 73.9 kg    Recent Labs  Lab 09/26/23 0730  NA 131*  K 3.8  CL 93*  CO2 22  GLUCOSE 115*  BUN 87*  CREATININE 11.49*  CALCIUM  8.0*  PHOS 5.2*    Recent Labs  Lab 09/23/23 0318 09/24/23 0327 09/25/23 0305 09/26/23 0842  WBC 6.5 6.5 7.3 6.6  NEUTROABS 5.0 4.8 5.6  --   HGB 10.5* 10.6* 10.0* 9.4*  HCT 31.9* 31.3* 29.7* 28.0*  MCV 98.2 96.9 96.7 97.2  PLT 113* 133* 121* 111*    Scheduled Meds:  apixaban   2.5 mg Oral BID   atorvastatin   40 mg Oral QHS   Chlorhexidine  Gluconate Cloth  6 each Topical Q0600   Chlorhexidine  Gluconate Cloth  6 each Topical Q0600   donepezil   10 mg Oral QHS   levothyroxine   50 mcg Oral Q0600   midodrine   10 mg Oral Q T,Th,Sat-1800   QUEtiapine   25 mg Oral QHS   sodium chloride  flush  3 mL Intravenous Q12H   Continuous Infusions: PRN Meds:.acetaminophen , albuterol , iohexol , melatonin, ondansetron  (ZOFRAN ) IV, mouth rinse, polyethylene glycol   Jayson Player,  MD 09/26/2023, 10:38 AM

## 2023-09-26 NOTE — Progress Notes (Addendum)
 Advised by CSW that pt can d/c to snf after HD today. Contacted [facility] to be advised of pt's d/c today and that pt should start tomorrow. HD arrangements placed on pt's AVS. Clinic provided snf name. Renal NP to send orders to clinic.   Randine Mungo Dialysis Navigator [phone]

## 2023-09-26 NOTE — TOC Transition Note (Signed)
 Transition of Care [facility]) - Discharge Note   Patient Details  Name: Ricky Harris MRN: [id number] Date of Birth: [birth year]  [facility]) CM/SW Contact:  Luise JAYSON Pan, LCSWA Phone Number: 09/26/2023, 10:55 AM   Clinical Narrative:   Patient will DC to: [facility]  Anticipated DC date: 09/26/23  Family notified: Redell (son)  Transport by: [facility]   Per MD patient ready for DC to [facility]. RN to call report prior to discharge [phone]; room 109). RN, patient, patient's family, and facility notified of DC. Discharge Summary and FL2 sent to facility. DC packet on chart. Ambulance transport requested for patient 2:00 PM.   CSW will sign off for now as social work intervention is no longer needed. Please consult us  again if new needs arise.      Final next level of care: Skilled Nursing Facility Barriers to Discharge: Barriers Resolved   Patient Goals and CMS Choice            Discharge Placement              Patient chooses bed at: Other - please specify in the comment section below: ([facility]) Patient to be transferred to facility by: [facility] Name of family member notified: Izea Livolsi (son) Patient and family notified of of transfer: 09/26/23  Discharge Plan and Services Additional resources added to the After Visit Summary for                                       Social Drivers of Health (SDOH) Interventions SDOH Screenings   Food Insecurity: No Food Insecurity (09/20/2023)  Housing: Low Risk  (09/20/2023)  Transportation Needs: No Transportation Needs (09/20/2023)  Utilities: Not At Risk (09/20/2023)  Social Connections: Unknown (09/20/2023)  Tobacco Use: Low Risk  (09/19/2023)     Readmission Risk Interventions     No data to display

## 2023-09-26 NOTE — Progress Notes (Signed)
 PT Cancellation Note  Patient Details Name: Ricky Harris MRN: [id number] DOB: [birth year]   Cancelled Treatment:    Reason Eval/Treat Not Completed: Patient at procedure or test/unavailable, upon arrival pt off unit for HD. Will continue to follow and progress mobility as able.   Izetta Call, PT, DPT   Acute Rehabilitation Department Office [phone] Secure Chat Communication Preferred   Izetta JULIANNA Call 09/26/2023, 8:38 AM

## 2023-09-26 NOTE — Plan of Care (Signed)
  Problem: Education: Goal: Knowledge of disease and its progression will improve Outcome: Progressing   Problem: Education: Goal: Knowledge of General Education information will improve Description: Including pain rating scale, medication(s)/side effects and non-pharmacologic comfort measures Outcome: Progressing   Problem: Clinical Measurements: Goal: Will remain free from infection Outcome: Progressing Goal: Respiratory complications will improve Outcome: Progressing   Problem: Pain Managment: Goal: General experience of comfort will improve and/or be controlled Outcome: Progressing

## 2023-09-26 NOTE — Discharge Summary (Addendum)
 Physician Discharge Summary   Patient: Ricky Harris MRN: [id number] DOB: 12/06/1940  Admit date:     09/19/2023  Discharge date: 09/26/23  Discharge Physician: Yetta Blanch  PCP: Thurmond Cathlyn LABOR., MD  Recommendations at discharge: Follow up with PCP in 1 week.  Repeat CBC in 1 week.   Contact information for follow-up providers     Thurmond Cathlyn LABOR., MD. Schedule an appointment as soon as possible for a visit in 1 week(s).   Specialty: Internal Medicine Contact information: [address] [place] [address] [phone]         [facility]. Go on 09/27/2023.   Why: Schedule is Monday, Wednesday, Friday with 11:30 am chair time. Contact information: [address] [place] [address] [phone]         Magda Debby SAILOR, MD. Schedule an appointment as soon as possible for a visit in 2 week(s).   Specialties: Vascular Surgery, Interventional Cardiology Contact information: [address] [place] [address] [phone]         Jenna Cordella LABOR, MD. Call in 1 week(s).   Specialties: Radiology, Interventional Radiology Why: To Establish Care Contact information: [address] [place] [address] [place] [address] [phone]         [facility] INTERVENTIONAL RADIOLOGY. Call.   Specialty: Radiology Why: As needed Contact information: [address] [place]  [address] [phone]             Contact information for after-discharge care     Destination     [facility] .   Service: Skilled Nursing Contact information: [address] [place]  [address] [phone]                    Discharge Diagnoses: Principal Problem:   Ground-level fall Active Problems:   Essential hypertension   HLD (hyperlipidemia)   Hypothyroidism   AAA (abdominal aortic aneurysm) without rupture (HCC)   End-stage renal disease on  hemodialysis (HCC)   Cirrhosis of liver (HCC)   H/O peptic ulcer   History of prostate cancer   GERD without esophagitis   Type II endoleak of aortic graft   Pressure injury of skin  Hospital Course: Patient with ESRD on HD TTS, dementia, paroxysmal A-fib, CAD, HFrEF, cirrhosis, hypothyroidism, AAA with repair with endoleak present to the hospital with complaints of passing out event with a fall.  So far workup for syncope is negative here in the hospital. Also found to have worsening chronic endoleak for which vascular surgery and IR were consulted. Assessment and Plan: Recurrent fall Likely deconditioning with orthostasis. CT head, C spine, XR Pelvis, CXR without any acute traumatic injuries.  PT recommending SNF. Orthostatic vitals are stable. Continue midodrine  pre-HD. Echocardiogram shows preserved EF, mild to moderate aortic stenosis.  No other significant valvular abnormality. Telemetry negative for any acute events.   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  Enlarging excluded aneurysm sac. Last imaging 12/'24 was 7.9 cm  now 9.9 cm on CT this admission. Vascular surgery was consulted.  Appreciate assistance. IR consulted.   Underwent IR guided procedure on 7/25.  Failed attempt at embolization of the endoleak.   Discussed with IR Dr. Jenna on 7/28.  Patient will need direct puncture embolization with Onyx and general anesthesia  D/w Dr Jenna on 7/28,  IR recommending to schedule this procedure outpatient right now. Discussed with vascular surgery as well as IR, okay to resume anticoagulation with Eliquis .   Elevated troponin. No hx chest pain. EKG with no acute ischemic change.  Troponin elevated in 50s. Most likely demand ischemia. Not a candidate for aggressive anticoagulation given endoleak.   Aberrant right subclavian artery with calcification Right medial  apical opacity on chest x-ray. Chest x-ray with a right apical opacity, followed by CT chest which shows an abberrant subclavian artery on the R possibly explaining findings. No pneumonia.   Dementia Continue home donepezil , Seroquel  at bedtime  ESRD on TTS HD:  Appreciate nephrology consultation. To accommodate discharge to SNF, case TTS schedule is now switch to MWF outpatient.  Paroxysmal A-fib:  On Eliquis  for anticoagulation; held in setting of enlarging aortic aneurysm sac.  Resume tonight.  CAD known severe three-vessel disease, not felt to be a good candidate for CABG or PCI. On DOAC per above, currently held.  Continue home atorvastatin   Chronic HFrEF Significant aortic systolic murmur Volume management with HD.  EF normal.  No significant stenosis.  Hx cirrhosis, chronic liver injury stable:  appears stable. Ammonia wnl   Hypothyroidism:  Continue home Levothyroxine .    Consultants:  Nephrology  Vascular surgery  IR  Procedures performed:  Angiogram right pelvis  HD  DISCHARGE MEDICATION: Allergies as of 09/26/2023       Reactions   Cephalexin Hives   Hydralazine  Hives        Medication List     STOP taking these medications    doxycycline 100 MG capsule Commonly known as: VIBRAMYCIN   traMADol  50 MG tablet Commonly known as: ULTRAM        TAKE these medications    atorvastatin  40 MG tablet Commonly known as: LIPITOR  Take 1 tablet (40 mg total) by mouth daily. What changed: when to take this   calcium  acetate 667 MG capsule Commonly known as: PHOSLO  Take 667 mg by mouth in the morning, at noon, and at bedtime.   donepezil  10 MG tablet Commonly known as: ARICEPT  Take 10 mg by mouth every evening.   Eliquis  2.5 MG Tabs tablet Generic drug: apixaban  Take 1 tablet by mouth twice daily.   HYDROcodone -acetaminophen  5-325 MG tablet Commonly known as: NORCO/VICODIN Take 1 tablet by mouth every 8 (eight) hours as needed for moderate pain  (pain score 4-6) or severe pain (pain score 7-10).   levothyroxine  50 MCG tablet Commonly known as: SYNTHROID  Take 1 tablet (50 mcg total) by mouth daily.   midodrine  10 MG tablet Commonly known as: PROAMATINE  Take 1 tablet (10 mg total) by mouth 3 (three) times a week. Tuesdays, Thursdays, Saturdays Start taking on: September 27, 2023   multivitamin Tabs tablet Take 1 tablet by mouth daily. What changed: when to take this   nitroGLYCERIN  0.4 MG SL tablet Commonly known as: NITROSTAT  Place 0.4 mg under the tongue every 5 (five) minutes as needed for chest pain.   polyethylene glycol powder 17 GM/SCOOP powder Commonly known as: GLYCOLAX /MIRALAX  Take 17 g by mouth daily as needed for mild constipation.   QUEtiapine  25 MG tablet Commonly known as: SEROQUEL  Take 25 mg by mouth every evening.       Disposition: SNF Diet recommendation: Renal diet  Discharge Exam: Vitals:   09/26/23 1101 09/26/23 1203 09/26/23 1204 09/26/23 1242  BP: (!) 109/53 (!) 99/56 (!) 118/54 (!) 130/55  Pulse: 72 65 69 67  Resp: 19 15 11  19  Temp:   97.6 F (36.4 C) 97.9 F (36.6 C)  TempSrc:    Oral  SpO2:   100% 100%  Weight:      Height:       Clear to auscultation  S1 S2 present  Aortic systolic murmur present  Abdominal bowel sounds present and non tender  No edema   Filed Weights   09/24/23 0500 09/26/23 0422 09/26/23 0836  Weight: 74.9 kg 73.3 kg 73.9 kg   Condition at discharge: stable  The results of significant diagnostics from this hospitalization (including imaging, microbiology, ancillary and laboratory) are listed below for reference.   Imaging Studies: IR US  Guide Vasc Access Right Result Date: 09/25/2023 INDICATION: Patient with an enlarging abdominal aortic aneurysm status post endograft placement and previous IMA embolization for a type 2 leak. CT evaluation of the aorta it demonstrates a repeat type 2 likely source paired L3 lumbar arteries. Planned attempt at endovascular  embolization. EXAM: Pelvic angiogram MEDICATIONS: None. The antibiotic was administered within 1 hour of the procedure ANESTHESIA/SEDATION: Moderate (conscious) sedation was employed during this procedure. A total of Versed  see epic record mg and Fentanyl  see epic record mcg was administered intravenously by the radiology nurse. Total intra-service moderate Sedation Time: See epic record minutes. The patient's level of consciousness and vital signs were monitored continuously by radiology nursing throughout the procedure under my direct supervision. CONTRAST:  65 mL Omnipaque  300 FLUOROSCOPY: Radiation Exposure Index (as provided by the fluoroscopic device): 438.9 mGy Kerma COMPLICATIONS: None immediate. PROCEDURE: Informed consent was obtained from the patient following explanation of the procedure, risks, benefits and alternatives. The patient understands, agrees and consents for the procedure. All questions were addressed. A time out was performed prior to the initiation of the procedure. Maximal barrier sterile technique utilized including caps, mask, sterile gowns, sterile gloves, large sterile drape, hand hygiene, and Betadine prep. The right groin was prepped and draped in usual sterile fashion. Ultrasound demonstrated the right common femoral artery to be anechoic and pulsatile indicating patency. After infiltrating the subcutaneous tissue down to the artery wall with 1% lidocaine  for local anesthesia, a micropuncture was used to gain access to the right common femoral artery. The needle was advanced from the skin to the midpoint of the artery under ultrasound guidance. Final image was obtained stored in patient's permanent medical record. Access was exchanged over an 014 guidewire for micropuncture sheath. Access was then exchanged for an 035 guidewire, a 6 French sheath, and an Omni flush catheter. The catheter was advanced to the distal portion of the stent graft and a a right common iliac artery  angiogram was performed. The anatomy was evaluated and then used to selectively cannulate the origin of the internal iliac artery. From this location repeat angiogram was performed. Three likely sources are identified as potential feeders to the L3 lumbar arteries. There is a bifurcating ileal lumbar artery as well as a lateral sacral artery. Using coaxial technique, guidewire and catheter were advanced through the primary catheter into the ileal lumbar artery. The lateral branch was then selected and from this a lateral branch of the ilial lumbar artery an angiogram was performed. Although the network over artery should do feed into the L3 lumbar artery on the right, the vessel [employer] such a small network I was unable to advance the catheter and in the guidewire far enough to engage the lumbar artery. The catheter and guidewire were then withdrawn to the main ileal lumbar trunk. And then the catheter  and guidewire were advanced into the medial branch. An angiogram of the medial branch was then performed finding similar results. The catheter and guidewire were then withdrawn and used to selectively cannulate the lateral sacral artery. The lateral sacral artery is more diminutive than the ileal lumbar branches and it cannot be traversed in any significant way. All catheters and guidewires removed from the patient. Hemostasis achieved with Angio-Seal closure device. IMPRESSION: Angiogram of the right ilial lumbar and lateral sacral branches as described above without direct access to the right L3 lumbar artery. The angiogram demonstrates the L3 lumbar artery to be feeding the sac and is the source of the type 2 endoleak. Given time on table and overall valuation, next plan for direct stick of the sac and using endovascular glue for closure. Electronically Signed   By: Cordella Banner   On: 09/25/2023 22:07   IR Aorta/Thoracic Result Date: 09/25/2023 INDICATION: Patient with an enlarging abdominal aortic  aneurysm status post endograft placement and previous IMA embolization for a type 2 leak. CT evaluation of the aorta it demonstrates a repeat type 2 likely source paired L3 lumbar arteries. Planned attempt at endovascular embolization. EXAM: Pelvic angiogram MEDICATIONS: None. The antibiotic was administered within 1 hour of the procedure ANESTHESIA/SEDATION: Moderate (conscious) sedation was employed during this procedure. A total of Versed  see epic record mg and Fentanyl  see epic record mcg was administered intravenously by the radiology nurse. Total intra-service moderate Sedation Time: See epic record minutes. The patient's level of consciousness and vital signs were monitored continuously by radiology nursing throughout the procedure under my direct supervision. CONTRAST:  65 mL Omnipaque  300 FLUOROSCOPY: Radiation Exposure Index (as provided by the fluoroscopic device): 438.9 mGy Kerma COMPLICATIONS: None immediate. PROCEDURE: Informed consent was obtained from the patient following explanation of the procedure, risks, benefits and alternatives. The patient understands, agrees and consents for the procedure. All questions were addressed. A time out was performed prior to the initiation of the procedure. Maximal barrier sterile technique utilized including caps, mask, sterile gowns, sterile gloves, large sterile drape, hand hygiene, and Betadine prep. The right groin was prepped and draped in usual sterile fashion. Ultrasound demonstrated the right common femoral artery to be anechoic and pulsatile indicating patency. After infiltrating the subcutaneous tissue down to the artery wall with 1% lidocaine  for local anesthesia, a micropuncture was used to gain access to the right common femoral artery. The needle was advanced from the skin to the midpoint of the artery under ultrasound guidance. Final image was obtained stored in patient's permanent medical record. Access was exchanged over an 014 guidewire for  micropuncture sheath. Access was then exchanged for an 035 guidewire, a 6 French sheath, and an Omni flush catheter. The catheter was advanced to the distal portion of the stent graft and a a right common iliac artery angiogram was performed. The anatomy was evaluated and then used to selectively cannulate the origin of the internal iliac artery. From this location repeat angiogram was performed. Three likely sources are identified as potential feeders to the L3 lumbar arteries. There is a bifurcating ileal lumbar artery as well as a lateral sacral artery. Using coaxial technique, guidewire and catheter were advanced through the primary catheter into the ileal lumbar artery. The lateral branch was then selected and from this a lateral branch of the ilial lumbar artery an angiogram was performed. Although the network over artery should do feed into the L3 lumbar artery on the right, the vessel [employer] such a  small network I was unable to advance the catheter and in the guidewire far enough to engage the lumbar artery. The catheter and guidewire were then withdrawn to the main ileal lumbar trunk. And then the catheter and guidewire were advanced into the medial branch. An angiogram of the medial branch was then performed finding similar results. The catheter and guidewire were then withdrawn and used to selectively cannulate the lateral sacral artery. The lateral sacral artery is more diminutive than the ileal lumbar branches and it cannot be traversed in any significant way. All catheters and guidewires removed from the patient. Hemostasis achieved with Angio-Seal closure device. IMPRESSION: Angiogram of the right ilial lumbar and lateral sacral branches as described above without direct access to the right L3 lumbar artery. The angiogram demonstrates the L3 lumbar artery to be feeding the sac and is the source of the type 2 endoleak. Given time on table and overall valuation, next plan for direct stick of  the sac and using endovascular glue for closure. Electronically Signed   By: Cordella Banner   On: 09/25/2023 22:07   IR Angiogram Pelvis Selective Or Supraselective Result Date: 09/25/2023 INDICATION: Patient with an enlarging abdominal aortic aneurysm status post endograft placement and previous IMA embolization for a type 2 leak. CT evaluation of the aorta it demonstrates a repeat type 2 likely source paired L3 lumbar arteries. Planned attempt at endovascular embolization. EXAM: Pelvic angiogram MEDICATIONS: None. The antibiotic was administered within 1 hour of the procedure ANESTHESIA/SEDATION: Moderate (conscious) sedation was employed during this procedure. A total of Versed  see epic record mg and Fentanyl  see epic record mcg was administered intravenously by the radiology nurse. Total intra-service moderate Sedation Time: See epic record minutes. The patient's level of consciousness and vital signs were monitored continuously by radiology nursing throughout the procedure under my direct supervision. CONTRAST:  65 mL Omnipaque  300 FLUOROSCOPY: Radiation Exposure Index (as provided by the fluoroscopic device): 438.9 mGy Kerma COMPLICATIONS: None immediate. PROCEDURE: Informed consent was obtained from the patient following explanation of the procedure, risks, benefits and alternatives. The patient understands, agrees and consents for the procedure. All questions were addressed. A time out was performed prior to the initiation of the procedure. Maximal barrier sterile technique utilized including caps, mask, sterile gowns, sterile gloves, large sterile drape, hand hygiene, and Betadine prep. The right groin was prepped and draped in usual sterile fashion. Ultrasound demonstrated the right common femoral artery to be anechoic and pulsatile indicating patency. After infiltrating the subcutaneous tissue down to the artery wall with 1% lidocaine  for local anesthesia, a micropuncture was used to gain access to  the right common femoral artery. The needle was advanced from the skin to the midpoint of the artery under ultrasound guidance. Final image was obtained stored in patient's permanent medical record. Access was exchanged over an 014 guidewire for micropuncture sheath. Access was then exchanged for an 035 guidewire, a 6 French sheath, and an Omni flush catheter. The catheter was advanced to the distal portion of the stent graft and a a right common iliac artery angiogram was performed. The anatomy was evaluated and then used to selectively cannulate the origin of the internal iliac artery. From this location repeat angiogram was performed. Three likely sources are identified as potential feeders to the L3 lumbar arteries. There is a bifurcating ileal lumbar artery as well as a lateral sacral artery. Using coaxial technique, guidewire and catheter were advanced through the primary catheter into the ileal lumbar artery. The lateral  branch was then selected and from this a lateral branch of the ilial lumbar artery an angiogram was performed. Although the network over artery should do feed into the L3 lumbar artery on the right, the vessel [employer] such a small network I was unable to advance the catheter and in the guidewire far enough to engage the lumbar artery. The catheter and guidewire were then withdrawn to the main ileal lumbar trunk. And then the catheter and guidewire were advanced into the medial branch. An angiogram of the medial branch was then performed finding similar results. The catheter and guidewire were then withdrawn and used to selectively cannulate the lateral sacral artery. The lateral sacral artery is more diminutive than the ileal lumbar branches and it cannot be traversed in any significant way. All catheters and guidewires removed from the patient. Hemostasis achieved with Angio-Seal closure device. IMPRESSION: Angiogram of the right ilial lumbar and lateral sacral branches as described  above without direct access to the right L3 lumbar artery. The angiogram demonstrates the L3 lumbar artery to be feeding the sac and is the source of the type 2 endoleak. Given time on table and overall valuation, next plan for direct stick of the sac and using endovascular glue for closure. Electronically Signed   By: Cordella Banner   On: 09/25/2023 22:07   IR Angiogram Selective Each Additional Vessel Result Date: 09/25/2023 INDICATION: Patient with an enlarging abdominal aortic aneurysm status post endograft placement and previous IMA embolization for a type 2 leak. CT evaluation of the aorta it demonstrates a repeat type 2 likely source paired L3 lumbar arteries. Planned attempt at endovascular embolization. EXAM: Pelvic angiogram MEDICATIONS: None. The antibiotic was administered within 1 hour of the procedure ANESTHESIA/SEDATION: Moderate (conscious) sedation was employed during this procedure. A total of Versed  see epic record mg and Fentanyl  see epic record mcg was administered intravenously by the radiology nurse. Total intra-service moderate Sedation Time: See epic record minutes. The patient's level of consciousness and vital signs were monitored continuously by radiology nursing throughout the procedure under my direct supervision. CONTRAST:  65 mL Omnipaque  300 FLUOROSCOPY: Radiation Exposure Index (as provided by the fluoroscopic device): 438.9 mGy Kerma COMPLICATIONS: None immediate. PROCEDURE: Informed consent was obtained from the patient following explanation of the procedure, risks, benefits and alternatives. The patient understands, agrees and consents for the procedure. All questions were addressed. A time out was performed prior to the initiation of the procedure. Maximal barrier sterile technique utilized including caps, mask, sterile gowns, sterile gloves, large sterile drape, hand hygiene, and Betadine prep. The right groin was prepped and draped in usual sterile fashion. Ultrasound  demonstrated the right common femoral artery to be anechoic and pulsatile indicating patency. After infiltrating the subcutaneous tissue down to the artery wall with 1% lidocaine  for local anesthesia, a micropuncture was used to gain access to the right common femoral artery. The needle was advanced from the skin to the midpoint of the artery under ultrasound guidance. Final image was obtained stored in patient's permanent medical record. Access was exchanged over an 014 guidewire for micropuncture sheath. Access was then exchanged for an 035 guidewire, a 6 French sheath, and an Omni flush catheter. The catheter was advanced to the distal portion of the stent graft and a a right common iliac artery angiogram was performed. The anatomy was evaluated and then used to selectively cannulate the origin of the internal iliac artery. From this location repeat angiogram was performed. Three likely sources are identified  as potential feeders to the L3 lumbar arteries. There is a bifurcating ileal lumbar artery as well as a lateral sacral artery. Using coaxial technique, guidewire and catheter were advanced through the primary catheter into the ileal lumbar artery. The lateral branch was then selected and from this a lateral branch of the ilial lumbar artery an angiogram was performed. Although the network over artery should do feed into the L3 lumbar artery on the right, the vessel [employer] such a small network I was unable to advance the catheter and in the guidewire far enough to engage the lumbar artery. The catheter and guidewire were then withdrawn to the main ileal lumbar trunk. And then the catheter and guidewire were advanced into the medial branch. An angiogram of the medial branch was then performed finding similar results. The catheter and guidewire were then withdrawn and used to selectively cannulate the lateral sacral artery. The lateral sacral artery is more diminutive than the ileal lumbar branches  and it cannot be traversed in any significant way. All catheters and guidewires removed from the patient. Hemostasis achieved with Angio-Seal closure device. IMPRESSION: Angiogram of the right ilial lumbar and lateral sacral branches as described above without direct access to the right L3 lumbar artery. The angiogram demonstrates the L3 lumbar artery to be feeding the sac and is the source of the type 2 endoleak. Given time on table and overall valuation, next plan for direct stick of the sac and using endovascular glue for closure. Electronically Signed   By: Cordella Banner   On: 09/25/2023 22:07   IR Angiogram Visceral Selective Result Date: 09/25/2023 INDICATION: Patient with an enlarging abdominal aortic aneurysm status post endograft placement and previous IMA embolization for a type 2 leak. CT evaluation of the aorta it demonstrates a repeat type 2 likely source paired L3 lumbar arteries. Planned attempt at endovascular embolization. EXAM: Pelvic angiogram MEDICATIONS: None. The antibiotic was administered within 1 hour of the procedure ANESTHESIA/SEDATION: Moderate (conscious) sedation was employed during this procedure. A total of Versed  see epic record mg and Fentanyl  see epic record mcg was administered intravenously by the radiology nurse. Total intra-service moderate Sedation Time: See epic record minutes. The patient's level of consciousness and vital signs were monitored continuously by radiology nursing throughout the procedure under my direct supervision. CONTRAST:  65 mL Omnipaque  300 FLUOROSCOPY: Radiation Exposure Index (as provided by the fluoroscopic device): 438.9 mGy Kerma COMPLICATIONS: None immediate. PROCEDURE: Informed consent was obtained from the patient following explanation of the procedure, risks, benefits and alternatives. The patient understands, agrees and consents for the procedure. All questions were addressed. A time out was performed prior to the initiation of the  procedure. Maximal barrier sterile technique utilized including caps, mask, sterile gowns, sterile gloves, large sterile drape, hand hygiene, and Betadine prep. The right groin was prepped and draped in usual sterile fashion. Ultrasound demonstrated the right common femoral artery to be anechoic and pulsatile indicating patency. After infiltrating the subcutaneous tissue down to the artery wall with 1% lidocaine  for local anesthesia, a micropuncture was used to gain access to the right common femoral artery. The needle was advanced from the skin to the midpoint of the artery under ultrasound guidance. Final image was obtained stored in patient's permanent medical record. Access was exchanged over an 014 guidewire for micropuncture sheath. Access was then exchanged for an 035 guidewire, a 6 French sheath, and an Omni flush catheter. The catheter was advanced to the distal portion of the stent graft  and a a right common iliac artery angiogram was performed. The anatomy was evaluated and then used to selectively cannulate the origin of the internal iliac artery. From this location repeat angiogram was performed. Three likely sources are identified as potential feeders to the L3 lumbar arteries. There is a bifurcating ileal lumbar artery as well as a lateral sacral artery. Using coaxial technique, guidewire and catheter were advanced through the primary catheter into the ileal lumbar artery. The lateral branch was then selected and from this a lateral branch of the ilial lumbar artery an angiogram was performed. Although the network over artery should do feed into the L3 lumbar artery on the right, the vessel [employer] such a small network I was unable to advance the catheter and in the guidewire far enough to engage the lumbar artery. The catheter and guidewire were then withdrawn to the main ileal lumbar trunk. And then the catheter and guidewire were advanced into the medial branch. An angiogram of the medial  branch was then performed finding similar results. The catheter and guidewire were then withdrawn and used to selectively cannulate the lateral sacral artery. The lateral sacral artery is more diminutive than the ileal lumbar branches and it cannot be traversed in any significant way. All catheters and guidewires removed from the patient. Hemostasis achieved with Angio-Seal closure device. IMPRESSION: Angiogram of the right ilial lumbar and lateral sacral branches as described above without direct access to the right L3 lumbar artery. The angiogram demonstrates the L3 lumbar artery to be feeding the sac and is the source of the type 2 endoleak. Given time on table and overall valuation, next plan for direct stick of the sac and using endovascular glue for closure. Electronically Signed   By: Cordella Banner   On: 09/25/2023 22:07   ECHOCARDIOGRAM COMPLETE Result Date: 09/24/2023    ECHOCARDIOGRAM REPORT   Patient Name:   Ricky Harris Date of Exam: 09/24/2023 Medical Rec #:  [id number]      Height:       73.0 in Accession #:    [id number]     Weight:       165.1 lb Date of Birth:  08-20-1940     BSA:          1.983 m Patient Age:    82 years       BP:           109/52 mmHg Patient Gender: M              HR:           72 bpm. Exam Location:  Inpatient Procedure: 2D Echo, 3D Echo, Color Doppler, Cardiac Doppler and Strain Analysis            (Both Spectral and Color Flow Doppler were utilized during            procedure). Indications:    Syncope  History:        Patient has prior history of Echocardiogram examinations, most                 recent 04/25/2021. CHF, CAD and Previous Myocardial Infarction,                 Abdominal Ascending Aorta Aneurysm, Arrythmias:Paraoxymal A-Fib;                 Risk Factors:Diabetes and Dyslipidemia. End Stage Renal Disease,  Cirrhosis, Cancer, Chronic Kidney Disease.  Sonographer:    Logan Shove RDCS Referring Phys: [id number] Victoriana Aziz M Annell Canty IMPRESSIONS  1. Left  ventricular ejection fraction, by estimation, is 55 to 60%. The left ventricle has normal function. The left ventricle has no regional wall motion abnormalities. There is moderate left ventricular hypertrophy. Left ventricular diastolic parameters are consistent with Grade I diastolic dysfunction (impaired relaxation).  2. Right ventricular systolic function is mildly reduced. The right ventricular size is normal. Tricuspid regurgitation signal is inadequate for assessing PA pressure.  3. Left atrial size was moderately dilated.  4. Right atrial size was mildly dilated.  5. The mitral valve is degenerative. Trivial mitral valve regurgitation. No evidence of mitral stenosis.  6. The aortic valve is abnormal. There is severe calcifcation of the aortic valve. Aortic valve regurgitation is trivial. Mild to moderate aortic valve stenosis. Aortic valve area, by VTI measures 2.36 cm. Aortic valve mean gradient measures 11.2 mmHg.  Aortic valve Vmax measures 2.32 m/s.  7. Aortic dilatation noted. There is mild dilatation of the ascending aorta, measuring 42 mm.  8. The inferior vena cava is normal in size with greater than 50% respiratory variability, suggesting right atrial pressure of 3 mmHg. Comparison(s): A prior study was performed on 04/25/21. Prior images reviewed side by side. LVEF has improved. FINDINGS  Left Ventricle: Left ventricular ejection fraction, by estimation, is 55 to 60%. The left ventricle has normal function. The left ventricle has no regional wall motion abnormalities. Global longitudinal strain performed but not reported based on interpreter judgement due to suboptimal tracking. The left ventricular internal cavity size was normal in size. There is moderate left ventricular hypertrophy. Left ventricular diastolic parameters are consistent with Grade I diastolic dysfunction (impaired relaxation). Right Ventricle: The right ventricular size is normal. No increase in right ventricular wall thickness.  Right ventricular systolic function is mildly reduced. Tricuspid regurgitation signal is inadequate for assessing PA pressure. Left Atrium: Left atrial size was moderately dilated. Right Atrium: Right atrial size was mildly dilated. Pericardium: There is no evidence of pericardial effusion. Mitral Valve: The mitral valve is degenerative in appearance. Mild to moderate mitral annular calcification. Trivial mitral valve regurgitation. No evidence of mitral valve stenosis. The mean mitral valve gradient is 1.7 mmHg. Tricuspid Valve: The tricuspid valve is grossly normal. Tricuspid valve regurgitation is mild . No evidence of tricuspid stenosis. Aortic Valve: The aortic valve is abnormal. There is severe calcifcation of the aortic valve. Aortic valve regurgitation is trivial. Mild to moderate aortic stenosis is present. Aortic valve mean gradient measures 11.2 mmHg. Aortic valve peak gradient measures 21.5 mmHg. Aortic valve area, by VTI measures 2.36 cm. Pulmonic Valve: The pulmonic valve was normal in structure. Pulmonic valve regurgitation is trivial. No evidence of pulmonic stenosis. Aorta: Aortic dilatation noted. There is mild dilatation of the ascending aorta, measuring 42 mm. Venous: The inferior vena cava is normal in size with greater than 50% respiratory variability, suggesting right atrial pressure of 3 mmHg. IAS/Shunts: The interatrial septum was not well visualized. Additional Comments: 3D was performed not requiring image post processing on an independent workstation and was indeterminate.  LEFT VENTRICLE PLAX 2D LVIDd:         3.90 cm   Diastology LVIDs:         2.70 cm   LV e' medial:    5.22 cm/s LV PW:         1.30 cm   LV E/e' medial:  12.0 LV IVS:  1.50 cm   LV e' lateral:   10.80 cm/s LVOT diam:     2.40 cm   LV E/e' lateral: 5.8 LV SV:         92 LV SV Index:   47 LVOT Area:     4.52 cm                           3D Volume EF:                          LV EDV:       170 ml                           LV ESV:       94 ml                          LV SV:        76 ml RIGHT VENTRICLE             IVC RV Basal diam:  3.80 cm     IVC diam: 2.00 cm RV S prime:     13.10 cm/s TAPSE (M-mode): 2.4 cm LEFT ATRIUM              Index        RIGHT ATRIUM           Index LA diam:        4.90 cm  2.47 cm/m   RA Area:     26.20 cm LA Vol (A2C):   107.0 ml 53.95 ml/m  RA Volume:   88.00 ml  44.37 ml/m LA Vol (A4C):   90.9 ml  45.83 ml/m LA Biplane Vol: 100.0 ml 50.42 ml/m  AORTIC VALVE AV Area (Vmax):    2.09 cm AV Area (Vmean):   2.19 cm AV Area (VTI):     2.36 cm AV Vmax:           232.00 cm/s AV Vmean:          145.312 cm/s AV VTI:            0.391 m AV Peak Grad:      21.5 mmHg AV Mean Grad:      11.2 mmHg LVOT Vmax:         107.00 cm/s LVOT Vmean:        70.200 cm/s LVOT VTI:          0.204 m LVOT/AV VTI ratio: 0.52  AORTA Ao Root diam: 3.30 cm Ao Asc diam:  4.20 cm MITRAL VALVE MV Area (PHT): 2.69 cm     SHUNTS MV Mean grad:  1.7 mmHg     Systemic VTI:  0.20 m MV Decel Time: 282 msec     Systemic Diam: 2.40 cm MV E velocity: 62.90 cm/s MV A velocity: 112.00 cm/s MV E/A ratio:  0.56 Soyla Merck MD Electronically signed by Soyla Merck MD Signature Date/Time: 09/24/2023/10:21:58 AM    Final    CT Angio Chest/Abd/Pel for Dissection W and/or W/WO Result Date: 09/20/2023 EXAM: CTA CHEST, ABDOMEN AND PELVIS WITHOUT AND WITH CONTRAST 09/20/2023 11:09:53 AM TECHNIQUE: CTA of the chest was performed without and with the administration of intravenous contrast. CTA of the abdomen and pelvis was performed without and with the administration of intravenous contrast. Multiplanar reformatted images are  provided for review. MIP images are provided for review. Automated exposure control, iterative reconstruction, and/or weight based adjustment of the mA/kV was utilized to reduce the radiation dose to as low as reasonably achievable. COMPARISON: CTA of 01/31/23 CLINICAL HISTORY: Aortic aneurysm suspected. 100ml omni  350 IV. Patient BIB ems for evaluation of fall on thinners. Partient reports several falls over last three days. Patient reports right wrist pain. Patient reports hit head no LOC. Patient dialysis patient, last session yesterday. Patient alert during triage. On eliquis . FINDINGS: VASCULATURE: AORTA: No intramural hematoma on noncontrast imaging. Thoracic aortic atherosclerosis without aneurysm. 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 The native sac measures 8.9 x 7.5 cm 161/3 versus 7.9 x 6.7 cm when measured in a similar fashion on the prior. Endoleak identified anteriorly and posteriorly including at 64/13. PULMONARY ARTERIES: No central pulmonary embolism on this non-dedicated exam. Pulmonary artery enlargement, outflow tract 3.5 cm. 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 CELIAC TRUNK: No acute finding. No occlusion or significant stenosis. SUPERIOR MESENTERIC ARTERY: No acute finding. No occlusion or significant stenosis. INFERIOR MESENTERIC ARTERY: No acute finding. No occlusion or significant stenosis. RENAL ARTERIES: Moderate atherosclerosis at the origin of both renal arteries with at least mild left renal artery narrowing. ILIAC ARTERIES: No acute finding. No occlusion or significant stenosis. CHEST: MEDIASTINUM: Mild cardiomegaly, without pericardial effusion. 4-vessel coronary artery calcification. LUNGS AND PLEURA: Similar trace right pleural thickening. Mild left hemidiaphragm elevation. Left base subsegmental atelectasis and/or scarring are not significantly changed. Superior segment left lower lobe subpleural 4 mm calcified granuloma. A 2 mm right upper lobe pulmonary nodule is unchanged and can be presumed benign. No follow up indicated. THORACIC BONES AND SOFT TISSUES: No acute bone or soft tissue abnormality. ABDOMEN AND PELVIS: LIVER: Cirrhosis, as evidenced by an irregular hepatic capsule and  moderate caudate lobe enlargement. GALLBLADDER AND BILE DUCTS: Cholecystectomy without biliary duct dilatation. SPLEEN: The spleen is unremarkable. PANCREAS: Fatty atrophy involving the pancreatic head and uncinate process. ADRENAL GLANDS: Bilateral adrenal glands demonstrate no acute abnormality. KIDNEYS, URETERS AND BLADDER: 4 mm lower pole right renal collecting system calculus. A lower pole left renal lesion measures 1.4 cm and is greater than fluid density on image 200 of series 6. This is likely enlarged compared to 11/04/2021. Other bilateral renal lesions are likely cysts. No hydronephrosis. No perinephric or periureteral stranding. Urinary bladder is unremarkable. GI AND BOWEL: Extensive colonic diverticulosis. Stomach and duodenal sweep demonstrate no acute abnormality. There is no bowel obstruction. No abnormal bowel wall thickening or distension. REPRODUCTIVE: Status post penile pump with bilateral reservoirs  Prostatectomy PERITONEUM AND RETROPERITONEUM: No ascites or free air. LYMPH NODES: No lymphadenopathy. 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 Renal osteodystrophy. Lumbar spondylosis No acute soft tissue abnormality. IMPRESSION: 1. 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 The native sac measures 8.9 x 7.5 cm, increased from 7.9 x 6.7 cm on the prior study. 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 2. Moderate atherosclerosis at the origin of both renal arteries with at least mild left renal artery narrowing. 3. Cirrhosis 4. Lower pole left renal complex lesion measuring 1.4 cm, likely minimally enlarged compared to 11/04/2021. Consider further evaluation with non-emergent outpatient pre- and post-contrast abdominal MRI or renal protocol CT. 5. No posttraumatic deformity identified. Electronically signed by: Rockey Kilts MD 09/20/2023 11:40 AM EDT RP Workstation: [id number]  DG Wrist Complete Right Result Date: 09/20/2023 CLINICAL DATA:  Pain after fall EXAM: RIGHT WRIST - COMPLETE 3 VIEW COMPARISON:  None Available. FINDINGS: No fracture or dislocation. Osteopenia. Advanced degenerative changes of the first carpometacarpal joint with sclerosis, joint space loss and osteophytes. There is also some degenerative changes of the distal radioulnar joint. Scattered vascular calcifications are identified. If there is persistent pain or further concern of a scaphoid injury, treatment with follow-up imaging in 7-10 days can be useful to assess for occult abnormality. IMPRESSION: Osteopenia.  Degenerative changes. Electronically Signed   By: Ranell Bring M.D.   On: 09/20/2023 10:05   CT CHEST WO CONTRAST Result Date: 09/20/2023 EXAM: CT CHEST WITHOUT CONTRAST 09/20/2023 03:31:23 AM TECHNIQUE: CT of the chest was performed without the administration of intravenous contrast. Multiplanar reformatted images are provided for review. Automated exposure control, iterative reconstruction, and/or weight based adjustment of the mA/kV was utilized to reduce the radiation dose to as low as reasonably achievable. COMPARISON: CTA chest dated 01/31/2023. CLINICAL HISTORY: R apical opacity. FINDINGS: MEDIASTINUM: Heart and pericardium are unremarkable. The central airways are clear. 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 Severe 3-vessel coronary atherosclerosis. LYMPH NODES: No mediastinal, hilar or axillary lymphadenopathy. LUNGS AND PLEURA: Eventration of the left hemidiaphragm with associated lingular and left lower lobe opacity, likely atelectasis. Mild dependent atelectasis in the right lower lobe. Trace right pleural fluid. SOFT TISSUES/BONES: Gynecomastia. Degenerative changes of the visualized thoracolumbar spine. UPPER ABDOMEN: Mildly nodular hepatic contour, suggesting cirrhosis. Splenomegaly. Severe bilateral renal  cortical atrophy. 2.4 cm simple right renal cyst (Bosniak 1), benign. Aortobiiliac stent with excluded aneurysm measuring at least 9.9 cm (series 2, image 199), incompletely visualized but progressive from the prior, previously 7.9 cm. IMPRESSION: 1. 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 2. No right apical pulmonary nodule. No acute cardiopulmonary abnormality. 3. Aortobiiliac stent with excluded aneurysm measuring at least 9.9 cm, incompletely visualized but progressive from the prior, previously 7.9 cm. Follow up consultation with vascular surgery is suggested. Electronically signed by: Pinkie Pebbles MD 09/20/2023 03:41 AM EDT RP Workstation: [id number]   DG Chest 2 View Result Date: 09/20/2023 EXAM: 2 VIEW(S) XRAY OF THE CHEST 09/20/2023 12:32:22 AM COMPARISON: 09/19/2023 CLINICAL HISTORY: ? pneumonia. right upper lobe airspace opacity. Recommend; repeat chest x-ray PA and lateral view for further evaluation per radiologist; Fall on thinner yesterday FINDINGS: LUNGS AND PLEURA: Persistent medial right apical opacity obscured on the lateral view. Consider CT chest for further evaluation. No pulmonary edema. No pleural effusion. No pneumothorax. HEART AND MEDIASTINUM: No acute abnormality of the cardiac and mediastinal silhouettes. BONES AND SOFT TISSUES: No acute osseous abnormality. IMPRESSION: 1. Persistent medial right apical opacity. 2. Consider CT chest for further evaluation. Electronically signed by: Pinkie Pebbles MD 09/20/2023 12:40 AM EDT RP Workstation: [id number]   CT HEAD WO CONTRAST Result Date: 09/19/2023 CLINICAL DATA:  Status post fall, on anticoagulation. EXAM: CT HEAD WITHOUT CONTRAST TECHNIQUE: Contiguous axial images were obtained from the base of the skull through the vertex without intravenous contrast. RADIATION DOSE REDUCTION: This exam was performed according to the departmental dose-optimization  program which includes automated exposure control, adjustment of the mA and/or kV according to patient size and/or use of iterative reconstruction technique. COMPARISON:  07/30/2022 FINDINGS: Brain: No intracranial hemorrhage, mass effect, or midline shift. Age related atrophy. No hydrocephalus. The basilar cisterns are patent. No evidence of territorial infarct or acute ischemia. Stable  periventricular and deep white matter hypodensity typical of chronic small vessel ischemia. No extra-axial or intracranial fluid collection. Vascular: Atherosclerosis of skullbase vasculature without hyperdense vessel or abnormal calcification. Skull: No fracture or focal lesion. Sinuses/Orbits: No acute finding. Other: No confluent scalp hematoma. IMPRESSION: 1. No acute intracranial abnormality. No skull fracture. 2. Age related atrophy and chronic small vessel ischemia. Electronically Signed   By: Andrea Gasman M.D.   On: 09/19/2023 22:12   CT CERVICAL SPINE WO CONTRAST Result Date: 09/19/2023 CLINICAL DATA:  Blunt trauma, status post fall.  On anticoagulation. EXAM: CT CERVICAL SPINE WITHOUT CONTRAST TECHNIQUE: Multidetector CT imaging of the cervical spine was performed without intravenous contrast. Multiplanar CT image reconstructions were also generated. RADIATION DOSE REDUCTION: This exam was performed according to the departmental dose-optimization program which includes automated exposure control, adjustment of the mA and/or kV according to patient size and/or use of iterative reconstruction technique. COMPARISON:  Cervical spine CT 08/13/2021 FINDINGS: Alignment: No traumatic subluxation. Mild levo scoliotic curvature. Grade 1 anterolisthesis of C3 on C4, C4 on C5, and C5 on C6. Skull base and vertebrae: No acute fracture. Mild flattening of C6 vertebra is chronic. The dens and skull base are intact. Soft tissues and spinal canal: No prevertebral fluid or swelling. No visible canal hematoma. Disc levels: Mild  multilevel degenerative disc disease. No spinal canal stenosis. Upper chest: Only minimally included in the field of view. Other: None. IMPRESSION: 1. No acute fracture or subluxation of the cervical spine. 2. Mild multilevel degenerative disc disease. Electronically Signed   By: Andrea Gasman M.D.   On: 09/19/2023 22:08   DG Pelvis Portable Result Date: 09/19/2023 CLINICAL DATA:  Trauma EXAM: PORTABLE PELVIS 1-2 VIEWS COMPARISON:  None Available. FINDINGS: No acute displaced fracture or dislocation of either hips. There is no evidence of pelvic fracture or diastasis. No pelvic bone lesions are seen. Penile implant noted. Aorto bi-iliac stent graft noted. Vascular calcifications. IMPRESSION: Negative for acute traumatic injury. Electronically Signed   By: Morgane  Naveau M.D.   On: 09/19/2023 21:40   DG Chest Port 1 View Result Date: 09/19/2023 CLINICAL DATA:  Trauma level 2 fall on thinners EXAM: PORTABLE CHEST 1 VIEW COMPARISON:  Chest x-ray 07/30/2022 FINDINGS: The heart and mediastinal contours are unchanged. Atherosclerotic plaque. Elevated left hemidiaphragm with left base atelectasis. Question developing right upper lobe airspace opacity. No pulmonary edema. No pleural effusion. No pneumothorax. No acute osseous abnormality. IMPRESSION: 1. Question developing right upper lobe airspace opacity. Recommend repeat chest x-ray PA and lateral view for further evaluation. 2.  Aortic Atherosclerosis (ICD10-I70.0). Electronically Signed   By: Morgane  Naveau M.D.   On: 09/19/2023 21:39    Microbiology: Results for orders placed or performed during the hospital encounter of 09/19/23  MRSA Next Gen by PCR, Nasal     Status: None   Collection Time: 09/20/23  1:43 PM   Specimen: Nasal Mucosa; Nasal Swab  Result Value Ref Range Status   MRSA by PCR Next Gen NOT DETECTED NOT DETECTED Final    Comment: (NOTE) The GeneXpert MRSA Assay (FDA approved for NASAL specimens only), is one component of a  comprehensive MRSA colonization surveillance program. It is not intended to diagnose MRSA infection nor to guide or monitor treatment for MRSA infections. Test performance is not FDA approved in patients less than 53 years old. Performed at [facility], [address] [place], [place] [address]    Labs: CBC: Recent Labs  Lab 09/21/23 [phone] 09/22/23 [phone]  09/23/23 0318 09/24/23 0327 09/25/23 0305 09/26/23 0842  WBC 5.0 5.9 6.5 6.5 7.3 6.6  NEUTROABS 3.7 4.5 5.0 4.8 5.6  --   HGB 9.9* 10.6* 10.5* 10.6* 10.0* 9.4*  HCT 30.0* 31.9* 31.9* 31.3* 29.7* 28.0*  MCV 96.5 98.2 98.2 96.9 96.7 97.2  PLT 96* 116* 113* 133* 121* 111*   Basic Metabolic Panel: Recent Labs  Lab 09/21/23 0353 09/22/23 0428 09/23/23 0318 09/24/23 0327 09/25/23 0305 09/26/23 0730  NA 135 137  137 136 135 131* 131*  K 4.1 4.0  3.9 4.6 3.8 4.1 3.8  CL 98 98  98 96* 93* 92* 93*  CO2 26 26  25 26 27 24 22   GLUCOSE 95 130*  127* 91 102* 92 115*  BUN 53* 56*  56* 68* 45* 64* 87*  CREATININE 7.67* 7.74*  7.75* 9.94* 7.01* 9.20* 11.49*  CALCIUM  8.1* 8.0*  8.1* 8.3* 8.2* 8.0* 8.0*  MG 2.0 2.0 2.1 1.9 2.0  --   PHOS 4.3 4.1 5.2* 4.0 4.5 5.2*   Liver Function Tests: Recent Labs  Lab 09/19/23 2125 09/21/23 0353 09/22/23 0428 09/23/23 0318 09/24/23 0327 09/25/23 0305 09/26/23 0730  AST 62*  --   --   --   --   --   --   ALT 43  --   --   --   --   --   --   ALKPHOS 235*  --   --   --   --   --   --   BILITOT 1.3*  --   --   --   --   --   --   PROT 7.0  --   --   --   --   --   --   ALBUMIN  3.1*   < > 2.7* 2.8* 2.9* 2.9* 2.8*   < > = values in this interval not displayed.   CBG: Recent Labs  Lab 09/19/23 2129 09/21/23 1527 09/21/23 1731 09/23/23 0620  GLUCAP 107* 54* 169* 84    Discharge time spent: greater than 30 minutes.  Author: Yetta Blanch, MD  [facility]

## 2023-09-26 NOTE — Progress Notes (Signed)
 Report given to Rosina of [employer].

## 2023-09-26 NOTE — Progress Notes (Signed)
   09/26/23 1204  Vitals  Temp 97.6 F (36.4 C)  Pulse Rate 69  Resp 11  BP (!) 118/54  SpO2 100 %  O2 Device Room Air  Post Treatment  Dialyzer Clearance Lightly streaked  Hemodialysis Intake (mL) 0 mL  Liters Processed 72  Fluid Removed (mL) 1200 mL  Tolerated HD Treatment Yes  AVG/AVF Arterial Site Held (minutes) 7 minutes  AVG/AVF Venous Site Held (minutes) 7 minutes   Received patient in bed to unit.  Alert and oriented.  Informed consent signed and in chart.   TX duration:3HRS  Patient tolerated well.  Transported back to the room  Alert, without acute distress.  Hand-off given to patient's nurse.   Access used: LAVF Access issues: NONE  Total UF removed: 1.2L Medication(s) given: NONE   Na'Shaminy T Bolden [facility]

## 2023-09-27 ENCOUNTER — Telehealth: Payer: Self-pay

## 2023-09-27 NOTE — Telephone Encounter (Signed)
 Pharmacy please advise on holding Apixaban  prior to ENDOLEAK REPAIR  scheduled for TBD. Last labs 09/26/2023. Thank you.

## 2023-09-27 NOTE — Progress Notes (Signed)
 Population Health TCM Discharge to SNF/Rehab  Received notification pt was discharged from [facility] to SNF/Rehab [facility] [phone])  on 09/26/23.  No action indicated by ACN at this time.  Follow as indicated pending discharge notification.   Jon Sharps, RN [facility] [phone]

## 2023-09-27 NOTE — Telephone Encounter (Signed)
...     Pre-operative Risk Assessment    Patient Name: Ricky Harris  DOB: 1940/07/24 MRN: [id number]   Date of last office visit: 11/07/22 Date of next office visit: NONE   Request for Surgical Clearance    Procedure:  ENDOLEAK REPAIR  Date of Surgery:  Clearance TBD                                Surgeon:  CORDELLA BANNER Surgeon's Group or Practice Name:  [facility] Phone number:  [phone] Fax number:  [phone]   Type of Clearance Requested:   - Medical  - Pharmacy:  Hold Apixaban  (Eliquis )     Type of Anesthesia:  MODERATE SEDATION   Additional requests/questions:    Bonney Teressa Rumalda Ronal   09/27/2023, 12:42 PM

## 2023-09-27 NOTE — Discharge Planning (Signed)
 [facility] Patient Discharge Orders- [facility] CLINIC: [facility]-Discharged to SNF  Patient's name: Ricky Harris Admit/DC Dates: 09/19/2023 - 09/26/2023  Discharge Diagnoses: Syncope - w/u (-)  Worsening chronic endoleak - VVS/IR following: S/p IR guided procedure 7/25-failed attempt at embolization of the endoleak. Per Dr. Jenna, patient will need direct puncture embolization with onyx and general anesthesia. IR recommending scheduling this procedure in outpatient. Dementia Afib - Eliquis  resumed  Aranesp : Given: No    Last Hgb: 9.4 PRBC's Given: No ESA dose for discharge: Start mircera per protocol per last Hgb here. IV Iron dose at discharge: Per protocol  Heparin  change: N/A  EDW Change: Yes New EDW: Lower to 74kg. Weights were under here. Notify renal team for any discrepancies.  Bath Change: No  Access intervention/Change: No  Calcitriol  change: No  Discharge Labs: Calcium  8.0  Phosphorus 5.2  Albumin  2.8  K+ 3.8  IV Antibiotics: No   On Coumadin?: No    D/C Meds to be reconciled by nurse after every discharge.  Completed By: Charmaine Piety, NP   Reviewed by: MD:______ RN_______

## 2023-09-27 NOTE — Progress Notes (Signed)
 Patient updated regarding plan of care for returning for direct puncture embolization of endoleak with Onyx rep and general anesthesia.  Patient scheduled for this on 10/05/2023 at 12:30 PM with Dr. Jenna as an outpatient.  Anesthesia and Onyx rep booked for this time. Made patient aware of this and informed him that he will be hearing from IR schedulers with exact details.  Return precautions given for any new or worsening symptoms, pt to go to the ER.  Patient voices understanding and agreement.   Kimble DEL Yishai Rehfeld PA-C 09/27/2023 7:42 AM

## 2023-10-02 ENCOUNTER — Other Ambulatory Visit: Payer: Self-pay | Admitting: Cardiology

## 2023-10-02 DIAGNOSIS — I480 Paroxysmal atrial fibrillation: Secondary | ICD-10-CM

## 2023-10-02 NOTE — Telephone Encounter (Signed)
 Prescription refill request for Eliquis  received. Indication:afib Last office visit:9/24 Scr:7.74  7/25 Age: 83 Weight:73.9  kg  Prescription refilled

## 2023-10-02 NOTE — Telephone Encounter (Signed)
   Name: Ricky Harris  DOB: [birth year]  MRN: [id number]  Primary Cardiologist: Peter Swazil[place], MD   Preoperative team, please contact this patient and set up a phone call appointment for further preoperative risk assessment. Please obtain consent and complete medication review. Thank you for your help.  I confirm that guidance regarding antiplatelet and oral anticoagulation therapy has been completed and, if necessary, noted below.  Patient has not had an Afib/aflutter ablation within the last 3 months or DCCV within the last 30 days   Per office protocol, patient can hold Eliquis  for 2.5 days prior to procedure.   I also confirmed the patient resides in the state of [place] . As per [facility] Medical Board telemedicine laws, the patient must reside in the state in which the provider is licensed.   [name], NP 10/02/2023, 1:06 PM [facility]

## 2023-10-02 NOTE — Telephone Encounter (Signed)
 Patient with diagnosis of afib on Eliquis  for anticoagulation.    Procedure: ENDOLEAK REPAIR  Date of procedure: TBD (10/05/23?)   CHA2DS2-VASc Score = 5   This indicates a 7.2% annual risk of stroke. 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    **This guidance is not considered finalized until pre-operative APP has relayed final recommendations.**

## 2023-10-02 NOTE — Telephone Encounter (Signed)
1st attempt to reach pt regarding surgical clearance and the need for a tele visit.  Left a message for pt to call back and ask for the preop team. 

## 2023-10-04 ENCOUNTER — Other Ambulatory Visit: Payer: Self-pay | Admitting: Radiology

## 2023-10-04 ENCOUNTER — Encounter (HOSPITAL_COMMUNITY): Payer: Self-pay

## 2023-10-04 DIAGNOSIS — I9789 Other postprocedural complications and disorders of the circulatory system, not elsewhere classified: Secondary | ICD-10-CM

## 2023-10-04 NOTE — Anesthesia Preprocedure Evaluation (Signed)
 Anesthesia Evaluation  Patient identified by MRN, date of birth, ID band Patient awake    Reviewed: Allergy & Precautions, NPO status , Patient's Chart, lab work & pertinent test results  Airway Mallampati: III  TM Distance: >3 FB Neck ROM: Full    Dental  (+) Poor Dentition, Dental Advisory Given   Pulmonary shortness of breath and with exertion   Pulmonary exam normal breath sounds clear to auscultation       Cardiovascular hypertension (143/60 preop, no antihypertensives. taking midodrine ), pulmonary hypertension (mild pHTN on cath)+ CAD (severe multivessel CAD, not a candidate for CABG d/t medical comorbidities), + Past MI, + Peripheral Vascular Disease and +CHF (LVEF normal, grade 1 diastolic dysfunction, mild reduction in RV function)  Normal cardiovascular exam+ dysrhythmias (eliquis ) Atrial Fibrillation + Valvular Problems/Murmurs (mild-mod AS) AS  Rhythm:Regular Rate:Normal  Echo 08/2023  1. Left ventricular ejection fraction, by estimation, is 55 to 60%. The  left ventricle has normal function. The left ventricle has no regional  wall motion abnormalities. There is moderate left ventricular hypertrophy.  Left ventricular diastolic  parameters are consistent with Grade I diastolic dysfunction (impaired  relaxation).   2. Right ventricular systolic function is mildly reduced. The right  ventricular size is normal. Tricuspid regurgitation signal is inadequate  for assessing PA pressure.   3. Left atrial size was moderately dilated.   4. Right atrial size was mildly dilated.   5. The mitral valve is degenerative. Trivial mitral valve regurgitation.  No evidence of mitral stenosis.   6. The aortic valve is abnormal. There is severe calcifcation of the  aortic valve. Aortic valve regurgitation is trivial. Mild to moderate  aortic valve stenosis. Aortic valve area, by VTI measures 2.36 cm. Aortic  valve mean gradient measures  11.2 mmHg.   Aortic valve Vmax measures 2.32 m/s.   7. Aortic dilatation noted. There is mild dilatation of the ascending  aorta, measuring 42 mm.   8. The inferior vena cava is normal in size with greater than 50%  respiratory variability, suggesting right atrial pressure of 3 mmHg.   Cath 2022   Prox LAD to Mid LAD lesion is 85% stenosed.   Mid LAD lesion is 50% stenosed.   Dist LAD lesion is 20% stenosed.   Prox Cx to Mid Cx lesion is 65% stenosed.   Prox RCA lesion is 95% stenosed.   Mid RCA lesion is 90% stenosed.   LV end diastolic pressure is mildly elevated.   Hemodynamic findings consistent with mild pulmonary hypertension. There is severe coronary calcification involving the LAD and right coronary artery with mild calcification of the left circumflex vessel. 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 Mild right heart pressure elevation with mild pulmonary hypertension.   s/p EVAR 11/23/230. He underwent repair of type II endoleak on 11/29/21 after excluded AAA increased from 5.5 cm to 7.1 cm. On 09/19/23, he presented to the [facility] ED for unwitnessed fall. He had hemodialysis earlier that day. CT head, c-spine, pelvis xray, and CXR without any acute traumatic injuries, but chest CT showed excluded aneurysm measuring at least 9.9 cm, up from 7.9 cm. 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 An endoleak was identified anteriorly and posteriorly.  Vascular surgery and IR consulted.  On 09/22/23, GWENETTA Jenna Hacker, MD performed pelvis angiogram (using IV sedation  with Fentanyl  and Versed ) that revealed a L3 lumbar artery feeding the sac and the source of the type II endoleak.  Attempt at embolization was unsuccessful, so plan to return for a direct puncture embolization with Onyx and general anesthesia.         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)  negative genitourinary   Musculoskeletal  (+) Arthritis , Osteoarthritis,    Abdominal   Peds  Hematology  (+) Blood dyscrasia, anemia Hb 12.2, plt 111   Anesthesia Other Findings   Reproductive/Obstetrics negative OB ROS                              Anesthesia Physical Anesthesia Plan  ASA: 4  Anesthesia Plan: General   Post-op Pain Management: Minimal or no pain anticipated   Induction: Intravenous  PONV Risk Score and Plan: 2 and Ondansetron  and Treatment may vary due to age or medical condition  Airway Management Planned: Oral ETT  Additional Equipment: Arterial line  Intra-op Plan:   Post-operative Plan: Extubation in OR  Informed Consent: I have reviewed the patients History and Physical, chart, labs and discussed the procedure including the risks, benefits and alternatives for the proposed anesthesia with the patient or authorized representative who has indicated his/her understanding and acceptance.     Dental advisory given  Plan Discussed with: CRNA  Anesthesia Plan Comments: (High risk for perioperative morbidity and mortality d/t severity of pre-existing conditions )         Anesthesia Quick Evaluation

## 2023-10-04 NOTE — Telephone Encounter (Signed)
 Patient has a scheduled surgery tomorrow, 8/7 but hasn't been able to be reached due to being at [facility] [phone] and trying to call him but his nurse Rosina said that he was in dialysis and will not return until 4:30 pm. Please advise.

## 2023-10-04 NOTE — Telephone Encounter (Signed)
 Attempted to contact the surgeon's office to receive further information regarding urgency of the procedure as the procedure is scheduled for 8/7 but he is currently as dialysis until 4:30 p.m. per APP procedure may need to be rescheduled.

## 2023-10-04 NOTE — Progress Notes (Signed)
 SDW INSTRUCTIONS given:   Your procedure is scheduled on October 05, 2023.             Report to [facility] Main Entrance A at 10:00 A.M., and check in at the Admitting office.             Call this number if you have problems the morning of surgery:             [phone]               Remember:             Do not eat after midnight the night before your surgery                              Take these medicines the morning of surgery with A SIP OF WATER  HYDROcodone -acetaminophen  (NORCO/VICODIN)  levothyroxine  (SYNTHROID )  nitroGLYCERIN  (NITROSTAT     apixaban  (ELIQUIS ) Last dose 10-02-23     As of today, STOP taking any Aspirin  (unless otherwise instructed by your surgeon) Aleve, Naproxen, Ibuprofen, Motrin, Advil, Goody's, BC's, all herbal medications, fish oil, and all vitamins.                       Do not wear jewelry, make up, or nail polish            Do not wear lotions, powders, perfumes/colognes, or deodorant.            Do not shave 48 hours prior to surgery.  Men may shave face and neck.            Do not bring valuables to the hospital.            [facility] is not responsible for any belongings or valuables.   Do NOT Smoke (Tobacco/Vaping) 24 hours prior to your procedure If you use a CPAP at night, you may bring all equipment for your overnight stay.   Contacts, glasses, dentures or bridgework may not be worn into surgery.      For patients admitted to the hospital, discharge time will be determined by your treatment team.   Patients discharged the day of surgery will not be allowed to drive home, and someone needs to stay with them for 24 hours.       Special instructions:   [facility]- Preparing For Surgery   Before surgery, you can play an important role. Because skin is not sterile, your skin needs to be as free of germs as possible. You can reduce the number of germs on your skin by washing with CHG (chlorahexidine gluconate) Soap before surgery.  CHG  is an antiseptic cleaner which kills germs and bonds with the skin to continue killing germs even after washing.     Oral Hygiene is also important to reduce your risk of infection.  Remember - BRUSH YOUR TEETH THE MORNING OF SURGERY WITH YOUR REGULAR TOOTHPASTE   Please do not use if you have an allergy to CHG or antibacterial soaps. If your skin becomes reddened/irritated stop using the CHG.  Do not shave (including legs and underarms) for at least 48 hours prior to first CHG shower. It is OK to shave your face.   Please follow these instructions carefully.              Shower the NIGHT BEFORE SURGERY and the MORNING OF SURGERY with DIAL Soap.  Pat yourself dry with a CLEAN TOWEL.   Wear CLEAN PAJAMAS to bed the night before surgery   Place CLEAN SHEETS on your bed the night of your first shower and DO NOT SLEEP WITH PETS.     Day of Surgery: Please shower morning of surgery  Wear Clean/Comfortable clothing the morning of surgery Do not apply any deodorants/lotions.   Remember to brush your teeth WITH YOUR REGULAR TOOTHPASTE.   Questions were answered. Patient verbalized understanding of instructions.

## 2023-10-04 NOTE — Progress Notes (Signed)
 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 He is s/p EVAR 11/23/230. He underwent repair of type II endoleak on 11/29/21 after excluded AAA increased from 5.5 cm to 7.1 cm. On 09/19/23, he presented to the [facility] ED for unwitnessed fall. He had hemodialysis earlier that day. CT head, c-spine, pelvis xray, and CXR without any acute traumatic injuries, but chest CT showed excluded aneurysm measuring at least 9.9 cm, up from 7.9 cm. He subsequently underwent CTA on 7/232/25 which showed native AAA sac 8.9 X 7.5 cm, increased from 7.9 X 6.7 cm. An endoleak was identified anteriorly and posteriorly.  Vascular surgery and IR consulted.   On 09/22/23, Ricky Ricky Cordella, MD performed pelvis angiogram (using IV sedation with Fentanyl  and Versed ) that revealed a L3 lumbar artery feeding the sac and the source of the type II endoleak.  Attempt at embolization was unsuccessful, so plan to return for a direct puncture embolization with Onyx and general anesthesia.   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% 08/2023), HTN, hypercholesterolemia, AAA (s/p EVAR 01/21/19; s/p coil/thrombin  embolization of inferior aneurysm sac via celiac-IMA route 11/29/21 for type II endoleak), cryptogenic liver cirrhosis (thrombocytopenia, ascites, s/p multiple paracentesis, last 03/27/20;  esophageal varies, s/p banding x3 12/19/17; small < 5 mm non-bleeding esophageal varices 02/14/20 EGD), ESRD (started HD 08/2019; LUE brachiocephalic AVF 03/12/19), prostate cancer (s/p prostatectomy; penile implant), hypothyroidism, 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   In July 2022 he was diagnosed with new HFrEF (LVEF 25-30%) and afib. He underwent LHC which showed multivessel CAD with 85-90% proximal LAD followed by 50% D1 and 20% mid-distal LAD, 65% proximal CX, and 95-99% proximal RCA followed by 90% mid RCA.  He was evaluated by CT surgeon Dr. Dorise Harris who thought his operative mortality would be prohibitive for CABG.  Cardiology did not think his RCA disease was amendable to PCI given tortuosity.  LAD could be treated with orbital atherectomy but there was concern about PCI and stenting giving risk of bleeding due to his cirrhosis and esophageal varices.  Medical therapy, including palliative care consult, were recommended.  He presented in February 2023 with chest pain and ruled in for NSTEMI.  His LVEF remained at 25-30%.  Again cardiology recommended treating medically based off discussion with interventional cardiology, review of his prior cath, and comorbidities.  He was discharged on Eliquis  and Plavix , metoprolol  and Imdur  continued, ASA discontinued, and Lipitor  increased. Since then his metoprolol  and Imdur  was discontinued due to hypotension, and he is no longer on Plavix  once he was > 1 year post NSTMEI.   His last cardiology visit was on 11/07/2022 with Dr. Peter Harris.  He was doing okay, tolerating dialysis but requiring midodrine  on HD days. No longer on nitrates or b-blockers due to hypotension. Volume status was stable. No chest pain or  edema. No change in baseline dyspnea. Tolerating apixaban  for PAF. Assessment remained, Poor candidate for CABG or PCI. S/p NSTEMI in February 2023 managed medically. Fortunately  he is not having any significant angina. Six month APP follow-up recommended.   He has dementia, on Aricept . He resides at [facility] [place].  Multiple co-morbidities as outlined above and including CAD (no felt to be a good candidate for CABG or PCI), CHF (with recently recovered LVEF), PAF, mild-moderate AS, cirrhosis, ESRD and recently found to have enlargement of AAA to ~ 9 cm from a type II endoleak. No new changes made at his cardiology follow-up with Dr. Swaziland in September.  Facility reported his last Eliquis  was on 10/02/23.   Anesthesia team to evaluate on the day of procedure and determine definitive anesthesia plan. He had HD on 10/04/23 PM.    ADDENDUM 10/05/23 11:00 AM: Reviewed cardiology notation by Ricky Collar, NP from yesterday afternoon, Patient was contacted 10/04/2023 in reference to pre-operative risk assessment for pending surgery as outlined below.     Since that day, Ricky Harris has done well continues at rehab center. Due to urgency of having Endoleak repair, he is cleared by speaking with his daughter, Ricky Harris [phone] by phone. He is without cardiac complaints. She confirms that he has held his Eliquis  for two days. I have informed IR PA,Ricky Pommier, PA by secure chat.    Per office protocol, patient can hold Eliquis  for 2.5 days prior to procedure.    Therefore, based on ACC/AHA guidelines, the patient would be at acceptable risk for the planned procedure without further cardiovascular testing.    VS: Ht 6' 1 (1.854 m)   Wt 73.9 kg   BMI 21.49 kg/m  BP Readings from Last 3 Encounters:  09/26/23 (!) 130/55  01/31/23 (!) 110/58  11/17/22 109/62   Pulse Readings from Last 3 Encounters:  09/26/23 67  01/31/23 87  11/17/22 63    PROVIDERS: Ricky Cathlyn LABOR., MD is PCP - Harris, Peter, MD is cardiologist. Previously he saw Ricky Shad, MD with [facility].  - [facility] provide  ESRD care - Ricky Claw, MD is GI [facility]). Seen at [facility] 09/20/17, but not felt to be a good candidate for liver transplantation at that time given CKD.  GLENWOOD Magda Ned, MD is vascular surgeon    LABS: Most recent lab results in [facility] include: Lab Results  Component Value Date   WBC 6.6 09/26/2023   HGB 9.4 (L) 09/26/2023   HCT 28.0 (L) 09/26/2023   PLT 111 (L) 09/26/2023   GLUCOSE 115 (H) 09/26/2023   CHOL 133 09/19/2020   TRIG 103 09/19/2020   HDL 38 (L) 09/19/2020   LDLCALC 74 09/19/2020   ALT 43 09/19/2023   AST 62 (H) 09/19/2023   NA 131 (L) 09/26/2023   K 3.8 09/26/2023   CL 93 (L) 09/26/2023   CREATININE 11.49 (H) 09/26/2023   BUN 87 (H) 09/26/2023   CO2 22 09/26/2023   TSH 2.005 09/20/2023   INR 1.1 09/22/2023   HGBA1C 4.5 (L) 04/25/2021     IMAGES: Pelvis Angiogram 09/22/23: IMPRESSION: Angiogram of the right ilial lumbar and lateral sacral branches as described above without direct access to the right L3 lumbar artery. The angiogram demonstrates the L3 lumbar artery to be feeding the sac and is the source of the type 2 endoleak. Given time on table and overall valuation, next plan for direct stick of  the sac and using endovascular glue for closure.   CTA Chest/abd/pelvis 09/20/23: IMPRESSION: 1. 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 The native sac measures 8.9 x 7.5 cm, increased from 7.9 x 6.7 cm on the prior study. 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 2. Moderate atherosclerosis at the origin of both renal arteries with at least mild left renal artery narrowing. 3. Cirrhosis 4. Lower pole left renal complex lesion measuring 1.4 cm, likely minimally enlarged compared to 11/04/2021. Consider further evaluation with non-emergent outpatient pre- and post-contrast abdominal MRI or renal protocol CT. 5. No posttraumatic deformity identified.  CT Chest  09/20/23: IMPRESSION: 1. 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 2. No right apical pulmonary nodule. No acute cardiopulmonary abnormality. 3. Aortobiiliac stent with excluded aneurysm measuring at least 9.9 cm, incompletely visualized but progressive from the prior, previously 7.9 cm. Follow up consultation with vascular surgery is suggested.  CT C-spine 09/19/23: IMPRESSION: 1. No acute fracture or subluxation of the cervical spine. 2. Mild multilevel degenerative disc disease.  CT Head 09/19/23: IMPRESSION: 1. No acute intracranial abnormality. No skull fracture. 2. Age related atrophy and chronic small vessel ischemia.   EKG: 09/19/23: Sinus rhythm Left bundle branch block No significant change since last tracing Confirmed by Dreama Longs ([id number]) on 09/19/2023 9:34:55 PM   CV: Echo 09/24/23:  IMPRESSIONS   1. Left ventricular ejection fraction, by estimation, is 55 to 60%. The  left ventricle has normal function. The left ventricle has no regional  wall motion abnormalities. There is moderate left ventricular hypertrophy.  Left ventricular diastolic  parameters are consistent with Grade I diastolic dysfunction (impaired  relaxation).   2. Right ventricular systolic function is mildly reduced. The right  ventricular size is normal. Tricuspid regurgitation signal is inadequate  for assessing PA pressure.   3. Left atrial size was moderately dilated.   4. Right atrial size was mildly dilated.   5. The mitral valve is degenerative. Trivial mitral valve regurgitation.  No evidence of mitral stenosis.   6. The aortic valve is abnormal. There is severe calcifcation of the  aortic valve. Aortic valve regurgitation is trivial. Mild to moderate  aortic valve stenosis. Aortic valve area, by VTI measures 2.36 cm. Aortic  valve mean gradient measures 11.2 mmHg.   Aortic valve Vmax measures 2.32 m/s.   7.  Aortic dilatation noted. There is mild dilatation of the ascending  aorta, measuring 42 mm.   8. The inferior vena cava is normal in size with greater than 50%  respiratory variability, suggesting right atrial pressure of 3 mmHg.  - Comparison(s): A prior study was performed on 04/25/21. Prior images  reviewed side by side. LVEF has improved.  - Comparison 04/25/21: LVEF 25-30%, LV global hypokinesis, mild LVH of basal-septal segment, grade 2 DD, normal RVSF, AV sclerosis without AS, ascending aorta 40 mm; 09/16/20: LVEF 25-30%, LV global hypokinesis, grade 1 DD, mildly reduced RVSF, RVSP 18.4 mmHg, trivial MR; EF 60-65% 09/24/19 & 01/14/19; LVEF 60% 03/30/17     Cardiac cath 09/18/20:   Prox LAD to Mid LAD lesion is 85% stenosed.   Mid LAD lesion is 50% stenosed.   Dist LAD lesion is 20% stenosed.   Prox Cx to Mid Cx lesion is 65% stenosed.   Prox RCA lesion is 95% stenosed.   Mid RCA lesion is 90% stenosed.   LV end diastolic pressure is mildly elevated.   Hemodynamic  findings consistent with mild pulmonary hypertension.   There is severe coronary calcification involving the LAD and right coronary artery with mild calcification of the left circumflex vessel.   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   Mild right heart pressure elevation with mild pulmonary hypertension.   RECOMMENDATION: The patient has severe coronary calcification and three-vessel disease.  Echo Doppler has demonstrated reduced EF in the 25 to 30% range.  Recommend initial surgical consultation for consideration of possible CABG revascularization surgery.  However with the  patient's significant comorbidities, if surgery is excluded, will need to review with colleagues regarding potential orbital atherectomy and intervention into the LAD and RCA.  We will consider reinstitution of low-dose heparin  much later today and this patient with recent PAF and ulcerated plaque in the proximal RCA. - Evaluated by CT surgeon Lucas Pride, MD on 09/20/20 who wrote, I think his operative mortality would be prohibitive.  I would consider whether high risk atherectomy and PCI is an option or treat him medically. Cardiology did not think RCA disease was amendable to therapy due to tortuosity, LAD could be treated with orbital atherectomy however there was still concern about PCI with stenting given possibility of bleeding due to cirrhosis and esophageal varices.  For that reason medical therapy was recommended including palliative care.  EP did not think he was a good candidate for antiarrhythmic therapy or ablation for A-fib.     Past Medical History:  Diagnosis Date   A-fib (HCC) 09/18/2020   AAA (abdominal aortic aneurysm) (HCC)    5.3cm , magd by vascular Dr Eliza ,   Anemia     slightly   Arthritis    knees   Ascites    Cancer (HCC)    prostate   CHF (congestive heart failure) (HCC)    HFrEF 08/2020   Chronic kidney disease    progressed to ESRD 08/2019   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[facility])    Coronary artery disease    on CT scan   Gout    H/O hypercholesterolemia    Heart murmur    History of falling    recent- see ct chest results of 04/25/2018   Hypertension    Hypothyroidism    Liver cirrhosis (HCC)    Myocardial infarction (HCC)    NSTEMI 04/25/21, treated medically   Prediabetes    pt denies    Renal artery stenosis (HCC)    Short-term memory loss    Thrombocytopenia (HCC)    Wears glasses     Past Surgical History:  Procedure Laterality Date   ABDOMINAL AORTIC ENDOVASCULAR STENT GRAFT N/A 01/21/2019   Procedure: ABDOMINAL AORTIC  ENDOVASCULAR STENT GRAFT WITH CO2;  Surgeon: Eliza Lonni RAMAN, MD;  Location: [facility] OR;  Service: Vascular;  Laterality: N/A;   ABDOMINAL AORTOGRAM W/LOWER EXTREMITY Bilateral 12/14/2018   Procedure: ABDOMINAL AORTOGRAM W/LOWER EXTREMITY;  Surgeon: Eliza Lonni RAMAN, MD;  Location: [facility] INVASIVE CV LAB;  Service: Cardiovascular;  Laterality: Bilateral;   AV FISTULA PLACEMENT Left 03/12/2019   Procedure: LEFT ARTERIOVENOUS Arteriovenous FISTULA CREATION.;  Surgeon: Eliza Lonni RAMAN, MD;  Location: [facility] OR;  Service: Vascular;  Laterality: Left;   CATARACT  EXTRACTION W/ INTRAOCULAR LENS  IMPLANT, BILATERAL     CHOLECYSTECTOMY     ESOPHAGEAL BANDING N/A 08/11/2017   Procedure: ESOPHAGEAL BANDING;  Surgeon: Ricky Claw, MD;  Location: [facility] ENDOSCOPY;  Service: Gastroenterology;  Laterality: N/A;   ESOPHAGEAL BANDING N/A 10/26/2017   Procedure: ESOPHAGEAL BANDING;  Surgeon: Ricky Claw, MD;  Location: [facility] ENDOSCOPY;  Service: Gastroenterology;  Laterality: N/A;   ESOPHAGEAL BANDING N/A 12/19/2017   Procedure: ESOPHAGEAL BANDING;  Surgeon: Ricky Claw, MD;  Location: [facility] ENDOSCOPY;  Service: Gastroenterology;  Laterality: N/A;   ESOPHAGOGASTRODUODENOSCOPY (EGD) WITH PROPOFOL  N/A 07/05/2017   Procedure: ESOPHAGOGASTRODUODENOSCOPY (EGD) WITH PROPOFOL ;  Surgeon: Ricky Claw, MD;  Location: [facility];  Service: Gastroenterology;  Laterality: N/A;   ESOPHAGOGASTRODUODENOSCOPY (EGD) WITH PROPOFOL  N/A 08/11/2017   Procedure: ESOPHAGOGASTRODUODENOSCOPY (EGD) WITH PROPOFOL ;  Surgeon: Ricky Claw, MD;  Location: [facility];  Service: Gastroenterology;  Laterality: N/A;   ESOPHAGOGASTRODUODENOSCOPY (EGD) WITH PROPOFOL  N/A 10/26/2017   Procedure: ESOPHAGOGASTRODUODENOSCOPY (EGD) WITH PROPOFOL ;  Surgeon: Ricky Claw, MD;  Location: [facility];  Service: Gastroenterology;  Laterality: N/A;   ESOPHAGOGASTRODUODENOSCOPY (EGD) WITH PROPOFOL  N/A 12/19/2017   Procedure:  ESOPHAGOGASTRODUODENOSCOPY (EGD) WITH PROPOFOL ;  Surgeon: Ricky Claw, MD;  Location: [facility]COPY;  Service: Gastroenterology;  Laterality: N/A;   ESOPHAGOGASTRODUODENOSCOPY (EGD) WITH PROPOFOL  N/A 10/29/2018   Procedure: ESOPHAGOGASTRODUODENOSCOPY (EGD) WITH PROPOFOL ;  Surgeon: Ricky Claw, MD;  Location: [facility];  Service: Gastroenterology;  Laterality: N/A;   ESOPHAGOGASTRODUODENOSCOPY (EGD) WITH PROPOFOL  N/A 02/14/2020   Procedure: ESOPHAGOGASTRODUODENOSCOPY (EGD) WITH PROPOFOL ;  Surgeon: Ricky Claw, MD;  Location: [facility] ENDOSCOPY;  Service: Gastroenterology;  Laterality: N/A;   EYE SURGERY     bilateral cataract removal with lens placement   HERNIA REPAIR     IR ANGIOGRAM PELVIS SELECTIVE OR SUPRASELECTIVE  09/22/2023   IR ANGIOGRAM PULMONARY BILATERAL SELECTIVE  12/03/2021   IR ANGIOGRAM SELECTIVE EACH ADDITIONAL VESSEL  12/03/2021   IR ANGIOGRAM SELECTIVE EACH ADDITIONAL VESSEL  09/22/2023   IR ANGIOGRAM VISCERAL SELECTIVE  12/03/2021   IR ANGIOGRAM VISCERAL SELECTIVE  12/03/2021   IR ANGIOGRAM VISCERAL SELECTIVE  12/03/2021   IR ANGIOGRAM VISCERAL SELECTIVE  09/22/2023   IR AORTA/THORACIC  09/22/2023   IR CT SPINE LTD  12/03/2021   IR PARACENTESIS  12/26/2018   IR PARACENTESIS  01/02/2019   IR PARACENTESIS  01/15/2019   IR PARACENTESIS  01/30/2019   IR PARACENTESIS  02/05/2019   IR PARACENTESIS  02/14/2019   IR PARACENTESIS  02/28/2019   IR PARACENTESIS  03/14/2019   IR PARACENTESIS  03/29/2019   IR PARACENTESIS  04/11/2019   IR PARACENTESIS  04/25/2019   IR PARACENTESIS  05/09/2019   IR PARACENTESIS  06/03/2019   IR PARACENTESIS  06/06/2019   IR PARACENTESIS  06/20/2019   IR PARACENTESIS  07/03/2019   IR PARACENTESIS  07/19/2019   IR PARACENTESIS  08/08/2019   IR PARACENTESIS  08/27/2019   IR PARACENTESIS  09/09/2019   IR PARACENTESIS  09/24/2019   IR PARACENTESIS  10/07/2019   IR PARACENTESIS  10/23/2019   IR PARACENTESIS  11/07/2019   IR PARACENTESIS  11/21/2019   IR  PARACENTESIS  12/04/2019   IR PARACENTESIS  12/25/2019   IR PARACENTESIS  01/10/2020   IR PARACENTESIS  01/27/2020   IR PARACENTESIS  02/19/2020   IR PARACENTESIS  03/27/2020   IR RADIOLOGIST EVAL & MGMT  11/16/2021   IR RADIOLOGIST EVAL & MGMT  08/24/2022   IR TRANSCATH/EMBOLIZ  12/03/2021   IR US  GUIDE VASC ACCESS RIGHT  12/03/2021  IR US  GUIDE VASC ACCESS RIGHT  09/22/2023   PROSTATECTOMY     RADIOLOGY WITH ANESTHESIA N/A 12/03/2021   Procedure: Type II Endoleak;  Surgeon: Alona Corners, DO;  Location: [facility] OR;  Service: Anesthesiology;  Laterality: N/A;   RIGHT AND LEFT HEART CATH N/A 09/18/2020   Procedure: RIGHT AND LEFT HEART CATH;  Surgeon: Burnard Debby LABOR, MD;  Location: [facility] INVASIVE CV LAB;  Service: Cardiovascular;  Laterality: N/A;   subtotal gastrectomy      MEDICATIONS: No current facility-administered medications for this encounter.    apixaban  (ELIQUIS ) 2.5 MG TABS tablet   atorvastatin  (LIPITOR ) 40 MG tablet   calcium  acetate (PHOSLO ) 667 MG capsule   donepezil  (ARICEPT ) 10 MG tablet   HYDROcodone -acetaminophen  (NORCO/VICODIN) 5-325 MG tablet   levothyroxine  (SYNTHROID ) 50 MCG tablet   midodrine  (PROAMATINE ) 10 MG tablet   multivitamin (RENA-VIT) TABS tablet   nitroGLYCERIN  (NITROSTAT ) 0.4 MG SL tablet   polyethylene glycol powder (GLYCOLAX /MIRALAX ) 17 GM/SCOOP powder   QUEtiapine  (SEROQUEL ) 25 MG tablet    Isaiah Ruder, PA-C Surgical Short Stay/Anesthesiology [facility] Phone [phone] [facility] Phone [phone] 10/04/2023 5:06 PM

## 2023-10-04 NOTE — Telephone Encounter (Signed)
   Name: Ricky Harris  DOB: 1940/10/25  MRN: [id number]   Primary Cardiologist: Peter Swaziland, MD  Chart reviewed as part of pre-operative protocol coverage. Patient was contacted 10/04/2023 in reference to pre-operative risk assessment for pending surgery as outlined below.    Since that day, Ricky Harris has done well continues at rehab center. Due to urgency of having Endoleak repair, he is cleared by speaking with his daughter, Ricky Harris [phone] by phone. He is without cardiac complaints. She confirms that he has held his Eliquis  for two days. I have informed IR PA,Wyatt Pommier, PA by secure chat.   Per office protocol, patient can hold Eliquis  for 2.5 days prior to procedure.   Therefore, based on ACC/AHA guidelines, the patient would be at acceptable risk for the planned procedure without further cardiovascular testing.   The patient was advised that if he develops new symptoms prior to surgery to contact our office to arrange for a follow-up visit, and he verbalized understanding.  I will route this recommendation to the requesting party via Epic fax function and remove from pre-op pool. Please call with questions.  Lamarr Satterfield, NP 10/04/2023, 4:19 PM

## 2023-10-04 NOTE — Progress Notes (Signed)
 PCP - Thurmond Cathlyn LABOR., MD  Cardiologist - Swaziland, Peter M, MD   PPM/ICD - denies Device Orders - n/a Rep Notified - n/a  Chest x-ray - 09-20-23 EKG - 09-19-23 Stress Test -  ECHO - 09-24-23 Cardiac Cath - 09-18-20  CPAP - n/a  Dm -denies  Blood Thinner Instructions: apixaban  (ELIQUIS ) Last dose 10-02-23 Aspirin  Instructions: n/a  ERAS Protcol - NPO  COVID TEST- n/a  Anesthesia review: yes, Afib, HTN, MI  Patient verbally denies any shortness of breath, fever, cough and chest pain during phone call   -------------  SDW INSTRUCTIONS given:  Your procedure is scheduled on October 05, 2023.  Report to [facility] Main Entrance A at 10:00 A.M., and check in at the Admitting office.  Call this number if you have problems the morning of surgery:  [phone]   Remember:  Do not eat after midnight the night before your surgery      Take these medicines the morning of surgery with A SIP OF WATER  HYDROcodone -acetaminophen  (NORCO/VICODIN)  levothyroxine  (SYNTHROID )  nitroGLYCERIN  (NITROSTAT    apixaban  (ELIQUIS ) Last dose 10-02-23   As of today, STOP taking any Aspirin  (unless otherwise instructed by your surgeon) Aleve, Naproxen, Ibuprofen, Motrin, Advil, Goody's, BC's, all herbal medications, fish oil, and all vitamins.                      Do not wear jewelry, make up, or nail polish            Do not wear lotions, powders, perfumes/colognes, or deodorant.            Do not shave 48 hours prior to surgery.  Men may shave face and neck.            Do not bring valuables to the hospital.            [facility] is not responsible for any belongings or valuables.  Do NOT Smoke (Tobacco/Vaping) 24 hours prior to your procedure If you use a CPAP at night, you may bring all equipment for your overnight stay.   Contacts, glasses, dentures or bridgework may not be worn into surgery.      For patients admitted to the hospital, discharge time will be determined by your treatment  team.   Patients discharged the day of surgery will not be allowed to drive home, and someone needs to stay with them for 24 hours.    Special instructions:   [facility]- Preparing For Surgery  Before surgery, you can play an important role. Because skin is not sterile, your skin needs to be as free of germs as possible. You can reduce the number of germs on your skin by washing with CHG (chlorahexidine gluconate) Soap before surgery.  CHG is an antiseptic cleaner which kills germs and bonds with the skin to continue killing germs even after washing.    Oral Hygiene is also important to reduce your risk of infection.  Remember - BRUSH YOUR TEETH THE MORNING OF SURGERY WITH YOUR REGULAR TOOTHPASTE  Please do not use if you have an allergy to CHG or antibacterial soaps. If your skin becomes reddened/irritated stop using the CHG.  Do not shave (including legs and underarms) for at least 48 hours prior to first CHG shower. It is OK to shave your face.  Please follow these instructions carefully.   Shower the NIGHT BEFORE SURGERY and the MORNING OF SURGERY with DIAL Soap.   Pat yourself  dry with a CLEAN TOWEL.  Wear CLEAN PAJAMAS to bed the night before surgery  Place CLEAN SHEETS on your bed the night of your first shower and DO NOT SLEEP WITH PETS.   Day of Surgery: Please shower morning of surgery  Wear Clean/Comfortable clothing the morning of surgery Do not apply any deodorants/lotions.   Remember to brush your teeth WITH YOUR REGULAR TOOTHPASTE.   Questions were answered. Patient verbalized understanding of instructions.

## 2023-10-04 NOTE — Telephone Encounter (Signed)
 Patient's surgery will need to be rescheduled until VV is complete. Please inform Dr.Babcock.  Pharmacy has weighed in but we need complete clearance with APP.

## 2023-10-04 NOTE — Progress Notes (Signed)
 Was made aware of possible cancellation for endoleak case tomorrow, 10/04/23, as cardiology was unable to complete virtual visit for clearance.  Spoke with our scheduler Rosina Edison via secure chat and was able to get in contact with patient's daughter, Deondrea Markos [phone]. This contact information was shared with cardiology NP Lamarr Satterfield, who was able to call daughter and clear patient. OK to proceed as planned with endoleak repair tomorrow. Per cardiology, clearance has been faxed to Rosina Edison at [phone].   Kimble DEL Matie Dimaano PA-C 10/04/2023 4:26 PM

## 2023-10-05 ENCOUNTER — Other Ambulatory Visit: Payer: Self-pay

## 2023-10-05 ENCOUNTER — Inpatient Hospital Stay (HOSPITAL_COMMUNITY)

## 2023-10-05 ENCOUNTER — Inpatient Hospital Stay (HOSPITAL_COMMUNITY): Admitting: Certified Registered Nurse Anesthetist

## 2023-10-05 ENCOUNTER — Ambulatory Visit (HOSPITAL_COMMUNITY): Admission: RE | Admit: 2023-10-05 | Discharge: 2023-10-05 | Disposition: A | Discharge status: Skilled Nursing Facility

## 2023-10-05 ENCOUNTER — Ambulatory Visit (HOSPITAL_COMMUNITY)
Admission: RE | Admit: 2023-10-05 | Discharge: 2023-10-05 | Disposition: A | Discharge status: Home / Self Care | Source: Ambulatory Visit | Attending: Physician Assistant | Admitting: Physician Assistant

## 2023-10-05 ENCOUNTER — Inpatient Hospital Stay (HOSPITAL_BASED_OUTPATIENT_CLINIC_OR_DEPARTMENT_OTHER): Admitting: Certified Registered Nurse Anesthetist

## 2023-10-05 ENCOUNTER — Encounter (HOSPITAL_COMMUNITY): Admission: RE | Disposition: A | Discharge status: Skilled Nursing Facility | Payer: Self-pay | Source: Home / Self Care

## 2023-10-05 ENCOUNTER — Encounter (HOSPITAL_COMMUNITY): Payer: Self-pay

## 2023-10-05 DIAGNOSIS — I2510 Atherosclerotic heart disease of native coronary artery without angina pectoris: Secondary | ICD-10-CM | POA: Diagnosis not present

## 2023-10-05 DIAGNOSIS — K7460 Unspecified cirrhosis of liver: Secondary | ICD-10-CM | POA: Insufficient documentation

## 2023-10-05 DIAGNOSIS — I714 Abdominal aortic aneurysm, without rupture, unspecified: Secondary | ICD-10-CM | POA: Diagnosis not present

## 2023-10-05 DIAGNOSIS — I9789 Other postprocedural complications and disorders of the circulatory system, not elsewhere classified: Secondary | ICD-10-CM | POA: Diagnosis not present

## 2023-10-05 DIAGNOSIS — I252 Old myocardial infarction: Secondary | ICD-10-CM | POA: Diagnosis not present

## 2023-10-05 DIAGNOSIS — F0390 Unspecified dementia without behavioral disturbance: Secondary | ICD-10-CM | POA: Diagnosis not present

## 2023-10-05 DIAGNOSIS — I132 Hypertensive heart and chronic kidney disease with heart failure and with stage 5 chronic kidney disease, or end stage renal disease: Secondary | ICD-10-CM | POA: Insufficient documentation

## 2023-10-05 DIAGNOSIS — I5022 Chronic systolic (congestive) heart failure: Secondary | ICD-10-CM | POA: Insufficient documentation

## 2023-10-05 DIAGNOSIS — I4891 Unspecified atrial fibrillation: Secondary | ICD-10-CM | POA: Insufficient documentation

## 2023-10-05 DIAGNOSIS — I120 Hypertensive chronic kidney disease with stage 5 chronic kidney disease or end stage renal disease: Secondary | ICD-10-CM

## 2023-10-05 DIAGNOSIS — N186 End stage renal disease: Secondary | ICD-10-CM | POA: Diagnosis not present

## 2023-10-05 DIAGNOSIS — E7800 Pure hypercholesterolemia, unspecified: Secondary | ICD-10-CM | POA: Diagnosis not present

## 2023-10-05 DIAGNOSIS — Z79899 Other long term (current) drug therapy: Secondary | ICD-10-CM | POA: Diagnosis not present

## 2023-10-05 DIAGNOSIS — Z992 Dependence on renal dialysis: Secondary | ICD-10-CM | POA: Insufficient documentation

## 2023-10-05 DIAGNOSIS — Z7901 Long term (current) use of anticoagulants: Secondary | ICD-10-CM | POA: Diagnosis not present

## 2023-10-05 DIAGNOSIS — E039 Hypothyroidism, unspecified: Secondary | ICD-10-CM | POA: Insufficient documentation

## 2023-10-05 HISTORY — PX: RADIOLOGY WITH ANESTHESIA: SHX6223

## 2023-10-05 HISTORY — PX: IR PATIENT EVAL TECH 0-60 MINS: IMG5564

## 2023-10-05 HISTORY — PX: EMBOLIZATION: SHX5803

## 2023-10-05 LAB — POCT I-STAT, CHEM 8
BUN: 52 mg/dL — ABNORMAL HIGH (ref 8–23)
Calcium, Ion: 1.12 mmol/L — ABNORMAL LOW (ref 1.15–1.40)
Chloride: 99 mmol/L (ref 98–111)
Creatinine, Ser: 5.3 mg/dL — ABNORMAL HIGH (ref 0.61–1.24)
Glucose, Bld: 88 mg/dL (ref 70–99)
HCT: 36 % — ABNORMAL LOW (ref 39.0–52.0)
Hemoglobin: 12.2 g/dL — ABNORMAL LOW (ref 13.0–17.0)
Potassium: 5.2 mmol/L — ABNORMAL HIGH (ref 3.5–5.1)
Sodium: 140 mmol/L (ref 135–145)
TCO2: 31 mmol/L (ref 22–32)

## 2023-10-05 LAB — TYPE AND SCREEN
ABO/RH(D): AB POS
Antibody Screen: NEGATIVE

## 2023-10-05 LAB — PROTIME-INR
INR: 1.1 (ref 0.8–1.2)
Prothrombin Time: 14.6 s (ref 11.4–15.2)

## 2023-10-05 LAB — GLUCOSE, CAPILLARY: Glucose-Capillary: 94 mg/dL (ref 70–99)

## 2023-10-05 SURGERY — RADIOLOGY WITH ANESTHESIA
Anesthesia: General

## 2023-10-05 MED ORDER — PROPOFOL 10 MG/ML IV BOLUS
INTRAVENOUS | Status: AC
  Filled 2023-10-05: qty 20

## 2023-10-05 MED ORDER — SODIUM CHLORIDE 0.9 % IV SOLN: INTRAVENOUS | Status: DC

## 2023-10-05 MED ORDER — ORAL CARE MOUTH RINSE: 15.0000 mL | Freq: Once | OROMUCOSAL | Status: AC

## 2023-10-05 MED ORDER — PHENYLEPHRINE HCL (PRESSORS) 10 MG/ML IV SOLN
INTRAVENOUS | Status: DC | PRN
  Administered 2023-10-05: 240 ug via INTRAVENOUS
  Administered 2023-10-05 (×2): 120 ug via INTRAVENOUS
  Administered 2023-10-05: 80 ug via INTRAVENOUS

## 2023-10-05 MED ORDER — ONDANSETRON HCL 4 MG/2ML IJ SOLN
INTRAMUSCULAR | Status: AC
Start: 2023-10-05 — End: 2023-10-05
  Filled 2023-10-05: qty 2

## 2023-10-05 MED ORDER — SUGAMMADEX SODIUM 200 MG/2ML IV SOLN
INTRAVENOUS | Status: DC | PRN
  Administered 2023-10-05 (×2): 200 mg via INTRAVENOUS

## 2023-10-05 MED ORDER — EPHEDRINE 5 MG/ML INJ
INTRAVENOUS | Status: AC
  Filled 2023-10-05: qty 10

## 2023-10-05 MED ORDER — VANCOMYCIN HCL IN DEXTROSE 1-5 GM/200ML-% IV SOLN
INTRAVENOUS | Status: AC
  Filled 2023-10-05: qty 200

## 2023-10-05 MED ORDER — LIDOCAINE 2% (20 MG/ML) 5 ML SYRINGE
INTRAMUSCULAR | Status: AC
  Filled 2023-10-05: qty 5

## 2023-10-05 MED ORDER — PHENYLEPHRINE 80 MCG/ML (10ML) SYRINGE FOR IV PUSH (FOR BLOOD PRESSURE SUPPORT)
PREFILLED_SYRINGE | INTRAVENOUS | Status: AC
  Filled 2023-10-05: qty 20

## 2023-10-05 MED ORDER — SODIUM CHLORIDE 0.9 % IV SOLN
INTRAVENOUS | Status: DC | PRN
  Administered 2023-10-05: 1000 mg via INTRAVENOUS

## 2023-10-05 MED ORDER — PHENYLEPHRINE HCL-NACL 20-0.9 MG/250ML-% IV SOLN
INTRAVENOUS | Status: DC | PRN
  Administered 2023-10-05: 50 ug/min via INTRAVENOUS

## 2023-10-05 MED ORDER — CHLORHEXIDINE GLUCONATE 0.12 % MT SOLN
15.0000 mL | Freq: Once | OROMUCOSAL | Status: AC
  Administered 2023-10-05: 15 mL via OROMUCOSAL
  Filled 2023-10-05: qty 15

## 2023-10-05 MED ORDER — FENTANYL CITRATE (PF) 250 MCG/5ML IJ SOLN
INTRAMUSCULAR | Status: DC | PRN
  Administered 2023-10-05 (×2): 50 ug via INTRAVENOUS

## 2023-10-05 MED ORDER — SUCCINYLCHOLINE CHLORIDE 200 MG/10ML IV SOSY
PREFILLED_SYRINGE | INTRAVENOUS | Status: AC
  Filled 2023-10-05: qty 10

## 2023-10-05 MED ORDER — VASOPRESSIN 20 UNIT/ML IV SOLN
INTRAVENOUS | Status: AC
Start: 2023-10-05 — End: 2023-10-05
  Filled 2023-10-05: qty 1

## 2023-10-05 MED ORDER — EPINEPHRINE 1 MG/10ML IJ SOSY
PREFILLED_SYRINGE | INTRAMUSCULAR | Status: AC
Start: 2023-10-05 — End: 2023-10-05
  Filled 2023-10-05: qty 10

## 2023-10-05 MED ORDER — FENTANYL CITRATE (PF) 250 MCG/5ML IJ SOLN
INTRAMUSCULAR | Status: AC
  Filled 2023-10-05: qty 5

## 2023-10-05 MED ORDER — FENTANYL CITRATE (PF) 100 MCG/2ML IJ SOLN
25.0000 ug | INTRAMUSCULAR | Status: DC | PRN
  Administered 2023-10-05: 25 ug via INTRAVENOUS

## 2023-10-05 MED ORDER — LIDOCAINE 2% (20 MG/ML) 5 ML SYRINGE
INTRAMUSCULAR | Status: DC | PRN
  Administered 2023-10-05: 60 mg via INTRAVENOUS

## 2023-10-05 MED ORDER — ROCURONIUM BROMIDE 10 MG/ML (PF) SYRINGE
PREFILLED_SYRINGE | INTRAVENOUS | Status: AC
Start: 2023-10-05 — End: 2023-10-05
  Filled 2023-10-05: qty 10

## 2023-10-05 MED ORDER — ROCURONIUM BROMIDE 10 MG/ML (PF) SYRINGE
PREFILLED_SYRINGE | INTRAVENOUS | Status: DC | PRN
  Administered 2023-10-05: 30 mg via INTRAVENOUS
  Administered 2023-10-05: 10 mg via INTRAVENOUS
  Administered 2023-10-05: 50 mg via INTRAVENOUS
  Administered 2023-10-05: 10 mg via INTRAVENOUS

## 2023-10-05 MED ORDER — ONDANSETRON HCL 4 MG/2ML IJ SOLN: 4.0000 mg | Freq: Once | INTRAMUSCULAR | Status: DC | PRN

## 2023-10-05 MED ORDER — PHENYLEPHRINE 80 MCG/ML (10ML) SYRINGE FOR IV PUSH (FOR BLOOD PRESSURE SUPPORT)
PREFILLED_SYRINGE | INTRAVENOUS | Status: DC | PRN
  Administered 2023-10-05: 80 ug via INTRAVENOUS

## 2023-10-05 MED ORDER — DEXAMETHASONE SODIUM PHOSPHATE 10 MG/ML IJ SOLN
INTRAMUSCULAR | Status: DC | PRN
  Administered 2023-10-05: 5 mg via INTRAVENOUS

## 2023-10-05 MED ORDER — ONDANSETRON HCL 4 MG/2ML IJ SOLN
INTRAMUSCULAR | Status: DC | PRN
  Administered 2023-10-05: 4 mg via INTRAVENOUS

## 2023-10-05 MED ORDER — DEXAMETHASONE SODIUM PHOSPHATE 10 MG/ML IJ SOLN
INTRAMUSCULAR | Status: AC
  Filled 2023-10-05: qty 1

## 2023-10-05 MED ORDER — FENTANYL CITRATE (PF) 100 MCG/2ML IJ SOLN
INTRAMUSCULAR | Status: AC
  Filled 2023-10-05: qty 2

## 2023-10-05 MED ORDER — PROPOFOL 10 MG/ML IV BOLUS
INTRAVENOUS | Status: DC | PRN
  Administered 2023-10-05: 100 mg via INTRAVENOUS

## 2023-10-05 NOTE — Procedures (Signed)
 Pre procedural Dx: Type II Endoleak  Post procedural Dx: Same  Technically successful CT guided direct stick Onyx endoleak embolization.  Region of leak and the paired lumbar arteries at that level filled with Onyx.   EBL: None.   Complications: None immediate.   KANDICE Banner, MD Pager #: [phone]

## 2023-10-05 NOTE — Anesthesia Procedure Notes (Addendum)
 Arterial Line Insertion Start/End8/08/2023 11:31 PM, 10/05/2023 11:37 PM Performed by: Merla Almarie HERO, DO, Roslynn Waddell LABOR, CRNA, CRNA  Preanesthetic checklist: patient identified, IV checked, site marked, risks and benefits discussed, surgical consent, monitors and equipment checked, pre-op evaluation, timeout performed and anesthesia consent Right, radial was placed Catheter size: 20 G Hand hygiene performed  and Seldinger technique used  Attempts: 2 Procedure performed using ultrasound guided technique. Ultrasound Notes:anatomy identified and no ultrasound evidence of intravascular and/or intraneural injection Following insertion, Biopatch and dressing applied. Patient tolerated the procedure well with no immediate complications.

## 2023-10-05 NOTE — Procedures (Signed)
 Technically successful CT guided direct stick Onyx endoleak embolization.  Region of leak and the paired lumbar arteries at that level filled with Onyx.

## 2023-10-05 NOTE — Anesthesia Procedure Notes (Signed)
 Procedure Name: Intubation Date/Time: 10/05/2023 1:17 PM  Performed by: Roslynn Waddell LABOR, CRNAPre-anesthesia Checklist: Patient identified, Emergency Drugs available, Suction available and Patient being monitored Patient Re-evaluated:Patient Re-evaluated prior to induction Oxygen Delivery Method: Circle System Utilized Preoxygenation: Pre-oxygenation with 100% oxygen Induction Type: IV induction Ventilation: Mask ventilation without difficulty and Oral airway inserted - appropriate to patient size Laryngoscope Size: Mac and 4 Grade View: Grade I Tube type: Oral Number of attempts: 1 Airway Equipment and Method: Stylet and Oral airway Placement Confirmation: ETT inserted through vocal cords under direct vision, positive ETCO2 and breath sounds checked- equal and bilateral Secured at: 23 cm Tube secured with: Tape Dental Injury: Teeth and Oropharynx as per pre-operative assessment  Comments: Patient with poor baseline dentition, especially front upper. Atraumatic induction/intubation. Dentition and oral mucosa as per baseline

## 2023-10-05 NOTE — Sedation Documentation (Signed)
Patient under the care of Anesthesia  

## 2023-10-05 NOTE — Transfer of Care (Signed)
 Immediate Anesthesia Transfer of Care Note  Patient: Ricky Harris  Procedure(s) Performed: RADIOLOGY WITH ANESTHESIA  Patient Location: PACU  Anesthesia Type:General  Level of Consciousness: awake and alert   Airway & Oxygen Therapy: Patient Spontanous Breathing and Patient connected to face mask oxygen  Post-op Assessment: Report given to RN and Post -op Vital signs reviewed and stable  Post vital signs: Reviewed and stable  Last Vitals:  Vitals Value Taken Time  BP 137/57 10/05/23 16:15  Temp    Pulse 49 10/05/23 16:29  Resp 19 10/05/23 16:29  SpO2 95 % 10/05/23 16:29  Vitals shown include unfiled device data.  Last Pain:  Vitals:   10/05/23 1615  TempSrc:   PainSc: Asleep         Complications: No notable events documented.

## 2023-10-05 NOTE — Anesthesia Postprocedure Evaluation (Signed)
 Anesthesia Post Note  Patient: Ricky Harris  Procedure(s) Performed: RADIOLOGY WITH ANESTHESIA     Patient location during evaluation: PACU Anesthesia Type: General Level of consciousness: awake and alert, oriented and patient cooperative Pain management: pain level controlled Vital Signs Assessment: post-procedure vital signs reviewed and stable Respiratory status: spontaneous breathing, nonlabored ventilation and respiratory function stable Cardiovascular status: blood pressure returned to baseline and stable Postop Assessment: no apparent nausea or vomiting Anesthetic complications: no   No notable events documented.  Last Vitals:  Vitals:   10/05/23 1633 10/05/23 1645  BP:  (!) 126/51  Pulse: (!) 53 (!) 51  Resp: (!) 21 16  Temp:    SpO2: 92% 94%    Last Pain:  Vitals:   10/05/23 1645  TempSrc:   PainSc: 0-No pain                 Almarie CHRISTELLA Marchi

## 2023-10-05 NOTE — Consult Note (Signed)
 Chief Complaint: Patient was seen in consultation today for endoleak repair  Referring Physician(s): Dr. Magda   Supervising Physician: Jenna Hacker  Patient Status: [facility] - In-pt  History of Present Illness: Ricky Harris is an 83 y.o. male with a medical history significant for dementia, ESRD on hemodialysis, paroxysmal atrial fibrillation on Eliquis , CAD, heart failure, anemia, thrombocytopenia and abdominal aortic aneurysm s/p repair 12/2018 followed by type II endoleak repair by IR in 2023. He was lost to Vascular follow up after this.    He presented to the ED 09/19/23 for evaluation of generalized weakness and multiple falls. His overall work up was unrevealing but 7/23 CT imaging  significant for status post infrarenal aortic aneurysm repair with aortic/bifurcated iliac endograft with anterior and posterior small volume endoleak (likely type 2) identified.  Vascular Surgery was consulted and the patient was evaluated by Dr. Magda. The recommendation was then made for Interventional Radiology to treat the aneurysm. Imaging reviewed by Dr. Philip.   Procedure was performed by Dr. Jenna 7/25; attempt to embolize was not successful at that time. Dr. Jenna discussed with the patient; treatment decision was made for patient to return for repair via direct puncture with Onyx under general.  ROS limited by memory concerns; son reports no concerns voiced by facility for SOB, CP, blood in urine, fever. Patient has continued to complain of R rib pain at facility due to recent falls.   Patient is DNR  Past Medical History:  Diagnosis Date   A-fib (HCC) 09/18/2020   AAA (abdominal aortic aneurysm) (HCC)    5.3cm , magd by vascular Dr Eliza ,   Anemia     slightly   Arthritis    knees   Ascites    Cancer (HCC)    prostate   CHF (congestive heart failure) (HCC)    HFrEF 08/2020   Chronic kidney disease    progressed to ESRD 08/2019   Cirrhosis of liver not due to  alcohol [facility])    Coronary artery disease    on CT scan   Gout    H/O hypercholesterolemia    Heart murmur    History of falling    recent- see ct chest results of 04/25/2018   Hypertension    Hypothyroidism    Liver cirrhosis (HCC)    Myocardial infarction (HCC)    NSTEMI 04/25/21, treated medically   Prediabetes    pt denies    Renal artery stenosis (HCC)    Short-term memory loss    Thrombocytopenia (HCC)    Wears glasses     Past Surgical History:  Procedure Laterality Date   ABDOMINAL AORTIC ENDOVASCULAR STENT GRAFT N/A 01/21/2019   Procedure: ABDOMINAL AORTIC ENDOVASCULAR STENT GRAFT WITH CO2;  Surgeon: Eliza Lonni RAMAN, MD;  Location: [facility] OR;  Service: Vascular;  Laterality: N/A;   ABDOMINAL AORTOGRAM W/LOWER EXTREMITY Bilateral 12/14/2018   Procedure: ABDOMINAL AORTOGRAM W/LOWER EXTREMITY;  Surgeon: Eliza Lonni RAMAN, MD;  Location: [facility] INVASIVE CV LAB;  Service: Cardiovascular;  Laterality: Bilateral;   AV FISTULA PLACEMENT Left 03/12/2019   Procedure: LEFT ARTERIOVENOUS Arteriovenous FISTULA CREATION.;  Surgeon: Eliza Lonni RAMAN, MD;  Location: [facility] OR;  Service: Vascular;  Laterality: Left;   CATARACT EXTRACTION W/ INTRAOCULAR LENS  IMPLANT, BILATERAL     CHOLECYSTECTOMY     ESOPHAGEAL BANDING N/A 08/11/2017   Procedure: ESOPHAGEAL BANDING;  Surgeon: Elicia Claw, MD;  Location: [facility] ENDOSCOPY;  Service: Gastroenterology;  Laterality: N/A;   ESOPHAGEAL BANDING N/A 10/26/2017  Procedure: ESOPHAGEAL BANDING;  Surgeon: Elicia Claw, MD;  Location: [facility] ENDOSCOPY;  Service: Gastroenterology;  Laterality: N/A;   ESOPHAGEAL BANDING N/A 12/19/2017   Procedure: ESOPHAGEAL BANDING;  Surgeon: Elicia Claw, MD;  Location: [facility] ENDOSCOPY;  Service: Gastroenterology;  Laterality: N/A;   ESOPHAGOGASTRODUODENOSCOPY (EGD) WITH PROPOFOL  N/A 07/05/2017   Procedure: ESOPHAGOGASTRODUODENOSCOPY (EGD) WITH PROPOFOL ;  Surgeon: Elicia Claw, MD;  Location: [facility] ENDOSCOPY;   Service: Gastroenterology;  Laterality: N/A;   ESOPHAGOGASTRODUODENOSCOPY (EGD) WITH PROPOFOL  N/A 08/11/2017   Procedure: ESOPHAGOGASTRODUODENOSCOPY (EGD) WITH PROPOFOL ;  Surgeon: Elicia Claw, MD;  Location: [facility] ENDOSCOPY;  Service: Gastroenterology;  Laterality: N/A;   ESOPHAGOGASTRODUODENOSCOPY (EGD) WITH PROPOFOL  N/A 10/26/2017   Procedure: ESOPHAGOGASTRODUODENOSCOPY (EGD) WITH PROPOFOL ;  Surgeon: Elicia Claw, MD;  Location: [facility] ENDOSCOPY;  Service: Gastroenterology;  Laterality: N/A;   ESOPHAGOGASTRODUODENOSCOPY (EGD) WITH PROPOFOL  N/A 12/19/2017   Procedure: ESOPHAGOGASTRODUODENOSCOPY (EGD) WITH PROPOFOL ;  Surgeon: Elicia Claw, MD;  Location: [facility] ENDOSCOPY;  Service: Gastroenterology;  Laterality: N/A;   ESOPHAGOGASTRODUODENOSCOPY (EGD) WITH PROPOFOL  N/A 10/29/2018   Procedure: ESOPHAGOGASTRODUODENOSCOPY (EGD) WITH PROPOFOL ;  Surgeon: Elicia Claw, MD;  Location: [facility] ENDOSCOPY;  Service: Gastroenterology;  Laterality: N/A;   ESOPHAGOGASTRODUODENOSCOPY (EGD) WITH PROPOFOL  N/A 02/14/2020   Procedure: ESOPHAGOGASTRODUODENOSCOPY (EGD) WITH PROPOFOL ;  Surgeon: Elicia Claw, MD;  Location: [facility] ENDOSCOPY;  Service: Gastroenterology;  Laterality: N/A;   EYE SURGERY     bilateral cataract removal with lens placement   HERNIA REPAIR     IR ANGIOGRAM PULMONARY BILATERAL SELECTIVE  12/03/2021   IR ANGIOGRAM SELECTIVE EACH ADDITIONAL VESSEL  12/03/2021   IR ANGIOGRAM VISCERAL SELECTIVE  12/03/2021   IR ANGIOGRAM VISCERAL SELECTIVE  12/03/2021   IR ANGIOGRAM VISCERAL SELECTIVE  12/03/2021   IR CT SPINE LTD  12/03/2021   IR PARACENTESIS  12/26/2018   IR PARACENTESIS  01/02/2019   IR PARACENTESIS  01/15/2019   IR PARACENTESIS  01/30/2019   IR PARACENTESIS  02/05/2019   IR PARACENTESIS  02/14/2019   IR PARACENTESIS  02/28/2019   IR PARACENTESIS  03/14/2019   IR PARACENTESIS  03/29/2019   IR PARACENTESIS  04/11/2019   IR PARACENTESIS  04/25/2019   IR PARACENTESIS  05/09/2019   IR  PARACENTESIS  06/03/2019   IR PARACENTESIS  06/06/2019   IR PARACENTESIS  06/20/2019   IR PARACENTESIS  07/03/2019   IR PARACENTESIS  07/19/2019   IR PARACENTESIS  08/08/2019   IR PARACENTESIS  08/27/2019   IR PARACENTESIS  09/09/2019   IR PARACENTESIS  09/24/2019   IR PARACENTESIS  10/07/2019   IR PARACENTESIS  10/23/2019   IR PARACENTESIS  11/07/2019   IR PARACENTESIS  11/21/2019   IR PARACENTESIS  12/04/2019   IR PARACENTESIS  12/25/2019   IR PARACENTESIS  01/10/2020   IR PARACENTESIS  01/27/2020   IR PARACENTESIS  02/19/2020   IR PARACENTESIS  03/27/2020   IR RADIOLOGIST EVAL & MGMT  11/16/2021   IR RADIOLOGIST EVAL & MGMT  08/24/2022   IR TRANSCATH/EMBOLIZ  12/03/2021   IR US  GUIDE VASC ACCESS RIGHT  12/03/2021   PROSTATECTOMY     RADIOLOGY WITH ANESTHESIA N/A 12/03/2021   Procedure: Type II Endoleak;  Surgeon: Alona Corners, DO;  Location: [facility] OR;  Service: Anesthesiology;  Laterality: N/A;   RIGHT AND LEFT HEART CATH N/A 09/18/2020   Procedure: RIGHT AND LEFT HEART CATH;  Surgeon: Burnard Debby LABOR, MD;  Location: [facility] INVASIVE CV LAB;  Service: Cardiovascular;  Laterality: N/A;   subtotal gastrectomy      Allergies: Cephalexin and Hydralazine   Medications:  Prior to Admission medications   Medication Sig Start Date End Date Taking? Authorizing Provider  atorvastatin  (LIPITOR ) 40 MG tablet Take 1 tablet (40 mg total) by mouth daily. Patient taking differently: Take 40 mg by mouth every evening. 04/28/21  Yes Rojelio Nest, DO  calcium  acetate (PHOSLO ) 667 MG capsule Take 667 mg by mouth in the morning, at noon, and at bedtime. 03/02/23 03/01/24 Yes [provider]  donepezil  (ARICEPT ) 10 MG tablet Take 10 mg by mouth every evening.   Yes [provider]  ELIQUIS  2.5 MG TABS tablet Take 1 tablet by mouth twice daily. 04/06/23  Yes [place], Peter M, MD  levothyroxine  (SYNTHROID ) 50 MCG tablet Take 1 tablet (50 mcg total) by mouth daily. 04/27/21  Yes Rojelio Nest, DO  midodrine   (PROAMATINE ) 10 MG tablet Take 1 tablet (10 mg total) by mouth daily as needed (takes 3 days a week on dialysis days). Patient taking differently: Take 10 mg by mouth 3 (three) times a week. Tuesdays, Thursdays, Saturdays 11/07/22  Yes [place], Peter M, MD  multivitamin (RENA-VIT) TABS tablet Take 1 tablet by mouth daily. Patient taking differently: Take 1 tablet by mouth every evening. 09/27/19  Yes Autry-Lott, Rojean, DO  nitroGLYCERIN  (NITROSTAT ) 0.4 MG SL tablet Place 0.4 mg under the tongue every 5 (five) minutes as needed for chest pain.   Yes [provider]  QUEtiapine  (SEROQUEL ) 25 MG tablet Take 25 mg by mouth every evening.   Yes [provider]  traMADol  (ULTRAM ) 50 MG tablet Take 1 tablet (50 mg total) by mouth every 8 (eight) hours as needed for moderate pain. Patient taking differently: Take 50 mg by mouth daily as needed for moderate pain (pain score 4-6). 03/12/19  Yes Rhyne, Samantha J, PA-C  doxycycline (VIBRAMYCIN) 100 MG capsule Take 100 mg by mouth 2 (two) times daily. Patient not taking: Reported on 09/20/2023 11/05/22   [provider]     Family History  Problem Relation Age of Onset   Diabetes Mellitus II Mother    Stroke Mother     Social History   Socioeconomic History   Marital status: Divorced    Spouse name: Not on file   Number of children: Not on file   Years of education: Not on file   Highest education level: Not on file  Occupational History   Not on file  Tobacco Use   Smoking status: Never   Smokeless tobacco: Never  Vaping Use   Vaping status: Never Used  Substance and Sexual Activity   Alcohol use: Not Currently   Drug use: Never   Sexual activity: Not on file  Other Topics Concern   Not on file  Social History Narrative   Not on file   Social Drivers of Health   Financial Resource Strain: Not on file  Food Insecurity: No Food Insecurity (09/20/2023)   Hunger Vital Sign    Worried About Running Out of Food in the  Last Year: Never true    Ran Out of Food in the Last Year: Never true  Transportation Needs: No Transportation Needs (09/20/2023)   PRAPARE - Administrator, Civil Service (Medical): No    Lack of Transportation (Non-Medical): No  Physical Activity: Not on file  Stress: Not on file  Social Connections: Unknown (09/20/2023)   Social Connection and Isolation Panel    Frequency of Communication with Friends and Family: Once a week    Frequency of Social Gatherings with Friends and Family:  Three times a week    Attends Religious Services: Never    Active Member of Clubs or Organizations: No    Attends Banker Meetings: Never    Marital Status: Patient unable to answer    Review of Systems: A 12 point ROS discussed and pertinent positives are indicated in the HPI above.  All other systems are negative.  Review of Systems  Unable to perform ROS: Dementia    Vital Signs: BP (!) 146/58 (BP Location: Right Arm)   Pulse 79   Temp (!) 97.5 F (36.4 C) (Oral)   Resp 19   Ht 6' 1 (1.854 m)   Wt 158 lb 4.6 oz (71.8 kg)   SpO2 95%   BMI 20.88 kg/m   Physical Exam Constitutional:      General: He is not in acute distress.    Appearance: He is not ill-appearing.  HENT:     Mouth/Throat:     Mouth: Mucous membranes are moist.     Pharynx: Oropharynx is clear.  Cardiovascular:     Rate and Rhythm: Normal rate.     Pulses: Normal pulses.     Heart sounds: Murmur heard.     Comments: L AVF +thrill/bruit Pulmonary:     Effort: Pulmonary effort is normal.  Abdominal:     Palpations: Abdomen is soft.     Tenderness: There is abdominal tenderness.     Comments: generalized  Musculoskeletal:     Right lower leg: No edema.     Left lower leg: No edema.  Skin:    General: Skin is warm and dry.  Neurological:     Mental Status: He is alert. Mental status is at baseline.     Comments: Patient awake and appeared to follow the conversation, comprehend the  information being relayed, etc.   Psychiatric:        Mood and Affect: Mood normal.        Behavior: Behavior normal.        Thought Content: Thought content normal.        Judgment: Judgment normal.     Labs:  CBC: Recent Labs    01/31/23 1732 09/19/23 2125 09/19/23 2130 09/20/23 0553 09/21/23 0353  WBC 5.9 7.7  --  6.8 5.0  HGB 10.2* 12.1* 12.9* 10.3* 9.9*  HCT 31.2* 37.9* 38.0* 31.3* 30.0*  PLT 124* 98*  --  93* 96*    COAGS: No results for input(s): INR, APTT in the last 8760 hours.  BMP: Recent Labs    01/31/23 1732 09/19/23 2125 09/19/23 2130 09/20/23 0553 09/21/23 0353  NA 132* 138 136 138 135  K 3.2* 3.7 3.7 3.8 4.1  CL 92* 97* 100 98 98  CO2 28 25  --  27 26  GLUCOSE 143* 123* 117* 93 95  BUN 43* 31* 32* 37* 53*  CALCIUM  7.7* 8.5*  --  8.3* 8.1*  CREATININE 5.51* 4.87* 4.70* 5.47* 7.67*  GFRNONAA 10* 11*  --  10* 7*    LIVER FUNCTION TESTS: Recent Labs    11/16/22 1731 01/31/23 1732 09/19/23 2125 09/21/23 0353  BILITOT 0.9 0.9 1.3*  --   AST 42* 45* 62*  --   ALT 38 49* 43  --   ALKPHOS 266* 272* 235*  --   PROT 7.1 6.9 7.0  --   ALBUMIN  3.3* 3.2* 3.1* 2.6*    TUMOR MARKERS: No results for input(s): AFPTM, CEA, CA199, CHROMGRNA in the last 8760 hours.  Assessment and Plan:  History of AAA s/p repair in 2020 by Vascular and Type II endoleak s/p repair by IR in 2023. Patient now with enlarging aneurysm. Unsuccessful attempted embolization 7/25. Patient returns today for direct puncture endoleak repair with Onyx under general. The procedure is to be performed by Dr. Jenna.    The patient has been be NPO since midnight. The last dose of Eliquis  was 8/4 per facility to RN report.   The patient verbalized that he was interested in proceeding. The patient's son was at bedside and provided consent.  Risks and benefits of this procedure were discussed with the patient including, but not limited to bleeding, infection, vascular  injury or contrast induced renal failure.  All of the patient's questions were answered, patient and patient son is agreeable to proceed.   Consent signed and with chart.    Thank you for this interesting consult.  I greatly enjoyed meeting DANDREA WIDDOWSON and look forward to participating in their care.  A copy of this report was sent to the requesting provider on this date.  Electronically Signed: Laymon Coast, NP 10/05/2023, 11:42 AM   I spent a total of 20 Minutes    in face to face in clinical consultation, greater than 50% of which was counseling/coordinating care for Endoleak.

## 2023-10-06 ENCOUNTER — Encounter (HOSPITAL_COMMUNITY): Payer: Self-pay

## 2023-10-06 NOTE — Telephone Encounter (Signed)
Clearance has been faxed to requesting office.  

## 2023-10-06 NOTE — Telephone Encounter (Signed)
 [facility] and [place] [phone] and spoke with Bria. Patient had procedure done yesterday 10/05/23 and patient is doing well. Nothing else is needed from a cardiac standpoint.

## 2023-10-06 NOTE — Addendum Note (Signed)
 Addendum  created 10/06/23 1844 by Niels Marien CROME, MD   Attestation recorded in Intraprocedure, Intraprocedure Attestations filed
